# Patient Record
Sex: Female | Born: 1960 | Race: Black or African American | Hispanic: No | Smoking: Former smoker
Health system: Southern US, Community
[De-identification: ages and names within clinical notes are randomized; demographics above are authoritative.]

## PROBLEM LIST (undated history)

## (undated) ENCOUNTER — Emergency Department (HOSPITAL_COMMUNITY): Payer: Commercial Managed Care - PPO

## (undated) DIAGNOSIS — R0981 Nasal congestion: Secondary | ICD-10-CM

## (undated) DIAGNOSIS — Z889 Allergy status to unspecified drugs, medicaments and biological substances status: Secondary | ICD-10-CM

## (undated) DIAGNOSIS — E785 Hyperlipidemia, unspecified: Secondary | ICD-10-CM

## (undated) DIAGNOSIS — E119 Type 2 diabetes mellitus without complications: Secondary | ICD-10-CM

## (undated) DIAGNOSIS — R609 Edema, unspecified: Secondary | ICD-10-CM

## (undated) DIAGNOSIS — R51 Headache: Secondary | ICD-10-CM

## (undated) DIAGNOSIS — H04123 Dry eye syndrome of bilateral lacrimal glands: Secondary | ICD-10-CM

## (undated) DIAGNOSIS — G4733 Obstructive sleep apnea (adult) (pediatric): Secondary | ICD-10-CM

## (undated) DIAGNOSIS — I872 Venous insufficiency (chronic) (peripheral): Secondary | ICD-10-CM

## (undated) DIAGNOSIS — K219 Gastro-esophageal reflux disease without esophagitis: Secondary | ICD-10-CM

## (undated) DIAGNOSIS — R200 Anesthesia of skin: Secondary | ICD-10-CM

## (undated) DIAGNOSIS — E039 Hypothyroidism, unspecified: Secondary | ICD-10-CM

## (undated) DIAGNOSIS — M51369 Other intervertebral disc degeneration, lumbar region without mention of lumbar back pain or lower extremity pain: Secondary | ICD-10-CM

## (undated) DIAGNOSIS — K859 Acute pancreatitis without necrosis or infection, unspecified: Secondary | ICD-10-CM

## (undated) DIAGNOSIS — D649 Anemia, unspecified: Secondary | ICD-10-CM

## (undated) DIAGNOSIS — B351 Tinea unguium: Secondary | ICD-10-CM

## (undated) DIAGNOSIS — Z87442 Personal history of urinary calculi: Secondary | ICD-10-CM

## (undated) DIAGNOSIS — Z9989 Dependence on other enabling machines and devices: Principal | ICD-10-CM

## (undated) DIAGNOSIS — R7303 Prediabetes: Secondary | ICD-10-CM

## (undated) DIAGNOSIS — R011 Cardiac murmur, unspecified: Secondary | ICD-10-CM

## (undated) DIAGNOSIS — K5900 Constipation, unspecified: Secondary | ICD-10-CM

## (undated) DIAGNOSIS — Z9689 Presence of other specified functional implants: Secondary | ICD-10-CM

## (undated) DIAGNOSIS — I10 Essential (primary) hypertension: Secondary | ICD-10-CM

## (undated) DIAGNOSIS — R351 Nocturia: Secondary | ICD-10-CM

## (undated) DIAGNOSIS — R350 Frequency of micturition: Secondary | ICD-10-CM

## (undated) DIAGNOSIS — B029 Zoster without complications: Secondary | ICD-10-CM

## (undated) DIAGNOSIS — M2550 Pain in unspecified joint: Secondary | ICD-10-CM

## (undated) DIAGNOSIS — Z8709 Personal history of other diseases of the respiratory system: Secondary | ICD-10-CM

## (undated) DIAGNOSIS — M5136 Other intervertebral disc degeneration, lumbar region: Secondary | ICD-10-CM

## (undated) DIAGNOSIS — M1990 Unspecified osteoarthritis, unspecified site: Secondary | ICD-10-CM

## (undated) DIAGNOSIS — R600 Localized edema: Secondary | ICD-10-CM

## (undated) HISTORY — PX: ABDOMINAL HYSTERECTOMY: SHX81

## (undated) HISTORY — DX: Zoster without complications: B02.9

## (undated) HISTORY — PX: COLONOSCOPY: SHX174

## (undated) HISTORY — DX: Tinea unguium: B35.1

## (undated) HISTORY — DX: Other intervertebral disc degeneration, lumbar region: M51.36

## (undated) HISTORY — PX: LAMINECTOMY THORACIC SPINE W/ PLACEMENT SPINAL CORD STIMULATOR: SHX1917

## (undated) HISTORY — PX: TUBAL LIGATION: SHX77

## (undated) HISTORY — DX: Obstructive sleep apnea (adult) (pediatric): G47.33

## (undated) HISTORY — PX: CARDIAC SURGERY: SHX584

## (undated) HISTORY — DX: Dependence on other enabling machines and devices: Z99.89

## (undated) HISTORY — PX: ESOPHAGOGASTRODUODENOSCOPY: SHX1529

## (undated) HISTORY — DX: Type 2 diabetes mellitus without complications: E11.9

## (undated) HISTORY — PX: FOOT SURGERY: SHX648

## (undated) HISTORY — DX: Edema, unspecified: R60.9

## (undated) HISTORY — PX: KNEE ARTHROSCOPY: SUR90

## (undated) HISTORY — PX: BACK SURGERY: SHX140

## (undated) HISTORY — DX: Other intervertebral disc degeneration, lumbar region without mention of lumbar back pain or lower extremity pain: M51.369

## (undated) HISTORY — PX: CHOLECYSTECTOMY: SHX55

## (undated) HISTORY — DX: Cardiac murmur, unspecified: R01.1

---

## 1997-05-29 ENCOUNTER — Encounter: Admission: RE | Admit: 1997-05-29 | Discharge: 1997-05-29 | Payer: Self-pay | Admitting: Family Medicine

## 1997-07-25 ENCOUNTER — Encounter: Admission: RE | Admit: 1997-07-25 | Discharge: 1997-07-25 | Payer: Self-pay | Admitting: Family Medicine

## 1997-08-15 ENCOUNTER — Encounter: Admission: RE | Admit: 1997-08-15 | Discharge: 1997-08-15 | Payer: Self-pay | Admitting: Family Medicine

## 1997-10-09 ENCOUNTER — Encounter: Admission: RE | Admit: 1997-10-09 | Discharge: 1997-10-09 | Payer: Self-pay | Admitting: Family Medicine

## 1997-11-13 ENCOUNTER — Encounter: Admission: RE | Admit: 1997-11-13 | Discharge: 1997-11-13 | Payer: Self-pay | Admitting: Family Medicine

## 1998-07-09 ENCOUNTER — Encounter: Admission: RE | Admit: 1998-07-09 | Discharge: 1998-07-09 | Payer: Self-pay | Admitting: Family Medicine

## 1998-11-20 ENCOUNTER — Encounter: Admission: RE | Admit: 1998-11-20 | Discharge: 1998-11-20 | Payer: Self-pay | Admitting: Family Medicine

## 1999-01-23 ENCOUNTER — Encounter: Admission: RE | Admit: 1999-01-23 | Discharge: 1999-01-23 | Payer: Self-pay | Admitting: Family Medicine

## 1999-01-23 ENCOUNTER — Ambulatory Visit (HOSPITAL_COMMUNITY): Admission: RE | Admit: 1999-01-23 | Discharge: 1999-01-23 | Payer: Self-pay | Admitting: Family Medicine

## 1999-03-20 ENCOUNTER — Encounter: Admission: RE | Admit: 1999-03-20 | Discharge: 1999-03-20 | Payer: Self-pay | Admitting: Family Medicine

## 1999-03-25 ENCOUNTER — Encounter: Payer: Self-pay | Admitting: Sports Medicine

## 1999-03-25 ENCOUNTER — Encounter: Admission: RE | Admit: 1999-03-25 | Discharge: 1999-03-25 | Payer: Self-pay | Admitting: *Deleted

## 1999-05-01 ENCOUNTER — Encounter: Admission: RE | Admit: 1999-05-01 | Discharge: 1999-05-01 | Payer: Self-pay | Admitting: Family Medicine

## 1999-06-15 ENCOUNTER — Encounter: Admission: RE | Admit: 1999-06-15 | Discharge: 1999-06-15 | Payer: Self-pay | Admitting: Family Medicine

## 1999-09-01 ENCOUNTER — Encounter: Admission: RE | Admit: 1999-09-01 | Discharge: 1999-09-01 | Payer: Self-pay | Admitting: Family Medicine

## 1999-11-19 ENCOUNTER — Encounter: Admission: RE | Admit: 1999-11-19 | Discharge: 1999-11-19 | Payer: Self-pay | Admitting: Family Medicine

## 2000-03-02 ENCOUNTER — Encounter: Admission: RE | Admit: 2000-03-02 | Discharge: 2000-03-02 | Payer: Self-pay | Admitting: Family Medicine

## 2001-04-14 ENCOUNTER — Encounter: Admission: RE | Admit: 2001-04-14 | Discharge: 2001-04-14 | Payer: Self-pay | Admitting: Family Medicine

## 2001-04-14 ENCOUNTER — Encounter: Payer: Self-pay | Admitting: Family Medicine

## 2001-11-17 ENCOUNTER — Encounter: Admission: RE | Admit: 2001-11-17 | Discharge: 2001-11-17 | Payer: Self-pay | Admitting: Family Medicine

## 2001-11-17 ENCOUNTER — Encounter: Payer: Self-pay | Admitting: Family Medicine

## 2002-05-31 ENCOUNTER — Encounter: Payer: Self-pay | Admitting: Family Medicine

## 2002-05-31 ENCOUNTER — Encounter: Admission: RE | Admit: 2002-05-31 | Discharge: 2002-05-31 | Payer: Self-pay | Admitting: Family Medicine

## 2003-02-26 ENCOUNTER — Emergency Department (HOSPITAL_COMMUNITY): Admission: EM | Admit: 2003-02-26 | Discharge: 2003-02-27 | Payer: Self-pay | Admitting: Emergency Medicine

## 2003-04-29 ENCOUNTER — Encounter: Admission: RE | Admit: 2003-04-29 | Discharge: 2003-04-29 | Payer: Self-pay | Admitting: Family Medicine

## 2004-04-19 ENCOUNTER — Emergency Department (HOSPITAL_COMMUNITY): Admission: EM | Admit: 2004-04-19 | Discharge: 2004-04-20 | Payer: Self-pay | Admitting: Emergency Medicine

## 2005-01-25 HISTORY — PX: CARDIAC CATHETERIZATION: SHX172

## 2005-04-15 ENCOUNTER — Encounter: Admission: RE | Admit: 2005-04-15 | Discharge: 2005-04-15 | Payer: Self-pay | Admitting: Family Medicine

## 2006-04-28 ENCOUNTER — Encounter: Admission: RE | Admit: 2006-04-28 | Discharge: 2006-04-28 | Payer: Self-pay | Admitting: Family Medicine

## 2006-09-21 ENCOUNTER — Emergency Department (HOSPITAL_COMMUNITY): Admission: EM | Admit: 2006-09-21 | Discharge: 2006-09-21 | Payer: Self-pay | Admitting: Emergency Medicine

## 2007-03-12 ENCOUNTER — Emergency Department (HOSPITAL_COMMUNITY): Admission: EM | Admit: 2007-03-12 | Discharge: 2007-03-12 | Payer: Self-pay | Admitting: Emergency Medicine

## 2007-03-13 ENCOUNTER — Emergency Department (HOSPITAL_COMMUNITY): Admission: EM | Admit: 2007-03-13 | Discharge: 2007-03-13 | Payer: Self-pay | Admitting: Emergency Medicine

## 2007-06-22 ENCOUNTER — Encounter: Admission: RE | Admit: 2007-06-22 | Discharge: 2007-06-22 | Payer: Self-pay | Admitting: Family Medicine

## 2007-07-05 ENCOUNTER — Encounter: Admission: RE | Admit: 2007-07-05 | Discharge: 2007-07-05 | Payer: Self-pay | Admitting: Family Medicine

## 2008-06-25 ENCOUNTER — Encounter: Admission: RE | Admit: 2008-06-25 | Discharge: 2008-06-25 | Payer: Self-pay | Admitting: Family Medicine

## 2008-12-02 ENCOUNTER — Encounter: Admission: RE | Admit: 2008-12-02 | Discharge: 2008-12-02 | Payer: Self-pay | Admitting: Cardiovascular Disease

## 2009-03-07 ENCOUNTER — Inpatient Hospital Stay (HOSPITAL_COMMUNITY): Admission: RE | Admit: 2009-03-07 | Discharge: 2009-03-14 | Payer: Self-pay | Admitting: Neurosurgery

## 2009-03-10 ENCOUNTER — Ambulatory Visit: Payer: Self-pay | Admitting: Physical Medicine & Rehabilitation

## 2009-03-10 HISTORY — PX: POSTERIOR LUMBAR FUSION: SHX6036

## 2009-12-15 ENCOUNTER — Encounter: Admission: RE | Admit: 2009-12-15 | Discharge: 2009-12-15 | Payer: Self-pay | Admitting: Family Medicine

## 2009-12-26 ENCOUNTER — Encounter: Admission: RE | Admit: 2009-12-26 | Discharge: 2009-12-26 | Payer: Self-pay | Admitting: Family Medicine

## 2010-02-15 ENCOUNTER — Encounter: Payer: Self-pay | Admitting: Family Medicine

## 2010-04-16 LAB — APTT: aPTT: 26 seconds (ref 24–37)

## 2010-04-16 LAB — URINALYSIS, ROUTINE W REFLEX MICROSCOPIC
Bilirubin Urine: NEGATIVE
Bilirubin Urine: NEGATIVE
Glucose, UA: NEGATIVE mg/dL
Ketones, ur: NEGATIVE mg/dL
Protein, ur: NEGATIVE mg/dL
Specific Gravity, Urine: 1.006 (ref 1.005–1.030)
Urobilinogen, UA: 0.2 mg/dL (ref 0.0–1.0)
pH: 6 (ref 5.0–8.0)

## 2010-04-16 LAB — COMPREHENSIVE METABOLIC PANEL
AST: 261 U/L — ABNORMAL HIGH (ref 0–37)
CO2: 27 mEq/L (ref 19–32)
Calcium: 7.9 mg/dL — ABNORMAL LOW (ref 8.4–10.5)
Creatinine, Ser: 0.74 mg/dL (ref 0.4–1.2)
GFR calc Af Amer: >60 mL/min (ref >60)
GFR calc non Af Amer: >60 mL/min (ref >60)
Glucose, Bld: 185 mg/dL — ABNORMAL HIGH (ref 70–99)

## 2010-04-16 LAB — CBC
HCT: 39.7 % (ref 36.0–46.0)
MCHC: 33.3 g/dL (ref 30.0–36.0)
MCHC: 33.8 g/dL (ref 30.0–36.0)
MCV: 84 fL (ref 78.0–100.0)
MCV: 84.4 fL (ref 78.0–100.0)
RBC: 3.52 MIL/uL — ABNORMAL LOW (ref 3.87–5.11)
RBC: 4.7 MIL/uL (ref 3.87–5.11)
RDW: 13.1 % (ref 11.5–15.5)
WBC: 6.1 10*3/uL (ref 4.0–10.5)

## 2010-04-16 LAB — BASIC METABOLIC PANEL
BUN: 10 mg/dL (ref 6–23)
CO2: 29 mEq/L (ref 19–32)
Chloride: 101 mEq/L (ref 96–112)
Creatinine, Ser: 0.73 mg/dL (ref 0.4–1.2)
GFR calc Af Amer: >60 mL/min (ref >60)
Potassium: 4 mEq/L (ref 3.5–5.1)

## 2010-04-16 LAB — POCT I-STAT 4, (NA,K, GLUC, HGB,HCT)
Glucose, Bld: 137 mg/dL — ABNORMAL HIGH (ref 70–99)
HCT: 28 % — ABNORMAL LOW (ref 36.0–46.0)
Sodium: 138 mEq/L (ref 135–145)
Sodium: 139 mEq/L (ref 135–145)

## 2010-04-16 LAB — DIFFERENTIAL
Basophils Relative: 1 % (ref 0–1)
Eosinophils Absolute: 0.4 10*3/uL (ref 0.0–0.7)
Eosinophils Relative: 7 % — ABNORMAL HIGH (ref 0–5)
Lymphs Abs: 2.6 10*3/uL (ref 0.7–4.0)
Monocytes Relative: 8 % (ref 3–12)
Neutrophils Relative %: 41 % — ABNORMAL LOW (ref 43–77)

## 2010-04-16 LAB — URINE CULTURE

## 2010-04-16 LAB — ABO/RH: ABO/RH(D): A POS

## 2010-04-16 LAB — PROTIME-INR: Prothrombin Time: 16.4 seconds — ABNORMAL HIGH (ref 11.6–15.2)

## 2010-04-16 LAB — TYPE AND SCREEN
ABO/RH(D): A POS
Antibody Screen: NEGATIVE

## 2010-04-16 LAB — URINE MICROSCOPIC-ADD ON

## 2010-04-18 ENCOUNTER — Emergency Department (HOSPITAL_COMMUNITY)
Admission: EM | Admit: 2010-04-18 | Discharge: 2010-04-18 | Disposition: A | Discharge status: Home / Self Care | Payer: BC Managed Care – PPO | Attending: Emergency Medicine | Admitting: Emergency Medicine

## 2010-04-18 ENCOUNTER — Emergency Department (HOSPITAL_COMMUNITY): Payer: BC Managed Care – PPO

## 2010-04-18 DIAGNOSIS — E039 Hypothyroidism, unspecified: Secondary | ICD-10-CM | POA: Insufficient documentation

## 2010-04-18 DIAGNOSIS — J029 Acute pharyngitis, unspecified: Secondary | ICD-10-CM | POA: Insufficient documentation

## 2010-04-18 DIAGNOSIS — Z139 Encounter for screening, unspecified: Secondary | ICD-10-CM | POA: Insufficient documentation

## 2010-04-18 DIAGNOSIS — M129 Arthropathy, unspecified: Secondary | ICD-10-CM | POA: Insufficient documentation

## 2010-04-18 DIAGNOSIS — K219 Gastro-esophageal reflux disease without esophagitis: Secondary | ICD-10-CM | POA: Insufficient documentation

## 2010-04-18 DIAGNOSIS — I10 Essential (primary) hypertension: Secondary | ICD-10-CM | POA: Insufficient documentation

## 2010-04-18 LAB — POCT I-STAT, CHEM 8
Calcium, Ion: 1.05 mmol/L — ABNORMAL LOW (ref 1.12–1.32)
Chloride: 104 mEq/L (ref 96–112)
HCT: 42 % (ref 36.0–46.0)
Potassium: 6.3 mEq/L (ref 3.5–5.1)

## 2010-04-18 LAB — RAPID STREP SCREEN (MED CTR MEBANE ONLY): Streptococcus, Group A Screen (Direct): NEGATIVE

## 2010-04-18 MED ORDER — IOHEXOL 300 MG/ML  SOLN
100.0000 mL | Freq: Once | INTRAMUSCULAR | Status: AC | PRN
  Administered 2010-04-18: 100 mL via INTRAVENOUS

## 2010-10-16 LAB — CBC
HCT: 36.5
Hemoglobin: 12.5
MCHC: 34.2
MCV: 79.3
Platelets: 399
RBC: 4.6
RDW: 13.8
WBC: 6.8

## 2010-10-16 LAB — URINALYSIS, ROUTINE W REFLEX MICROSCOPIC
Protein, ur: 30 — AB
Urobilinogen, UA: 1

## 2010-10-16 LAB — DIFFERENTIAL
Basophils Absolute: 0.1
Basophils Relative: 1
Eosinophils Absolute: 0.5
Eosinophils Relative: 7 — ABNORMAL HIGH
Lymphocytes Relative: 35
Lymphs Abs: 2.3
Monocytes Absolute: 0.7
Monocytes Relative: 11
Neutro Abs: 3.1
Neutrophils Relative %: 46

## 2010-10-16 LAB — BASIC METABOLIC PANEL
CO2: 27
Chloride: 102
Creatinine, Ser: 0.67
GFR calc Af Amer: >60
Potassium: 3.7

## 2010-10-16 LAB — BASIC METABOLIC PANEL WITH GFR
BUN: 10
Calcium: 8.9
GFR calc non Af Amer: >60
Glucose, Bld: 112 — ABNORMAL HIGH
Sodium: 135

## 2010-10-16 LAB — URINE CULTURE
Colony Count: NO GROWTH
Culture: NO GROWTH

## 2010-10-16 LAB — URINE MICROSCOPIC-ADD ON

## 2010-11-06 LAB — DIFFERENTIAL
Eosinophils Absolute: 0.3
Eosinophils Relative: 6 — ABNORMAL HIGH
Lymphocytes Relative: 38
Lymphs Abs: 2
Monocytes Absolute: 0.5

## 2010-11-06 LAB — CBC
Platelets: 376
RBC: 4.83
WBC: 5.2

## 2010-11-06 LAB — COMPREHENSIVE METABOLIC PANEL
ALT: 19
AST: 20
Albumin: 3.4 — ABNORMAL LOW
CO2: 27
Calcium: 9.2
Chloride: 105
GFR calc Af Amer: >60
GFR calc non Af Amer: >60
Sodium: 140

## 2010-11-06 LAB — URINE MICROSCOPIC-ADD ON

## 2010-11-06 LAB — URINALYSIS, ROUTINE W REFLEX MICROSCOPIC
Glucose, UA: NEGATIVE
Leukocytes, UA: NEGATIVE
Nitrite: NEGATIVE
Specific Gravity, Urine: 1.028
pH: 5.5

## 2010-11-06 LAB — LIPASE, BLOOD: Lipase: 16

## 2010-11-27 ENCOUNTER — Other Ambulatory Visit: Payer: Self-pay | Admitting: Family Medicine

## 2010-11-27 DIAGNOSIS — Z1231 Encounter for screening mammogram for malignant neoplasm of breast: Secondary | ICD-10-CM

## 2011-02-02 ENCOUNTER — Ambulatory Visit
Admission: RE | Admit: 2011-02-02 | Discharge: 2011-02-02 | Disposition: A | Discharge status: Home / Self Care | Payer: BC Managed Care – PPO | Source: Ambulatory Visit | Attending: Family Medicine | Admitting: Family Medicine

## 2011-02-02 DIAGNOSIS — Z1231 Encounter for screening mammogram for malignant neoplasm of breast: Secondary | ICD-10-CM

## 2011-12-05 ENCOUNTER — Observation Stay (HOSPITAL_COMMUNITY)
Admission: EM | Admit: 2011-12-05 | Discharge: 2011-12-10 | Disposition: A | Discharge status: Home / Self Care | Payer: BC Managed Care – PPO | Attending: Internal Medicine | Admitting: Internal Medicine

## 2011-12-05 ENCOUNTER — Encounter (HOSPITAL_COMMUNITY): Payer: Self-pay | Admitting: Emergency Medicine

## 2011-12-05 ENCOUNTER — Emergency Department (HOSPITAL_COMMUNITY): Payer: BC Managed Care – PPO

## 2011-12-05 DIAGNOSIS — K219 Gastro-esophageal reflux disease without esophagitis: Secondary | ICD-10-CM | POA: Insufficient documentation

## 2011-12-05 DIAGNOSIS — Z7982 Long term (current) use of aspirin: Secondary | ICD-10-CM | POA: Insufficient documentation

## 2011-12-05 DIAGNOSIS — I10 Essential (primary) hypertension: Secondary | ICD-10-CM

## 2011-12-05 DIAGNOSIS — Z7902 Long term (current) use of antithrombotics/antiplatelets: Secondary | ICD-10-CM | POA: Insufficient documentation

## 2011-12-05 DIAGNOSIS — F411 Generalized anxiety disorder: Secondary | ICD-10-CM | POA: Insufficient documentation

## 2011-12-05 DIAGNOSIS — B9689 Other specified bacterial agents as the cause of diseases classified elsewhere: Secondary | ICD-10-CM | POA: Diagnosis present

## 2011-12-05 DIAGNOSIS — R0602 Shortness of breath: Secondary | ICD-10-CM | POA: Insufficient documentation

## 2011-12-05 DIAGNOSIS — R072 Precordial pain: Secondary | ICD-10-CM | POA: Diagnosis present

## 2011-12-05 DIAGNOSIS — E876 Hypokalemia: Secondary | ICD-10-CM

## 2011-12-05 DIAGNOSIS — F419 Anxiety disorder, unspecified: Secondary | ICD-10-CM | POA: Diagnosis present

## 2011-12-05 DIAGNOSIS — M79609 Pain in unspecified limb: Secondary | ICD-10-CM | POA: Insufficient documentation

## 2011-12-05 DIAGNOSIS — N760 Acute vaginitis: Secondary | ICD-10-CM | POA: Insufficient documentation

## 2011-12-05 DIAGNOSIS — R7309 Other abnormal glucose: Secondary | ICD-10-CM | POA: Insufficient documentation

## 2011-12-05 DIAGNOSIS — R079 Chest pain, unspecified: Principal | ICD-10-CM | POA: Insufficient documentation

## 2011-12-05 DIAGNOSIS — K760 Fatty (change of) liver, not elsewhere classified: Secondary | ICD-10-CM | POA: Diagnosis present

## 2011-12-05 DIAGNOSIS — A499 Bacterial infection, unspecified: Secondary | ICD-10-CM | POA: Insufficient documentation

## 2011-12-05 DIAGNOSIS — Z792 Long term (current) use of antibiotics: Secondary | ICD-10-CM | POA: Insufficient documentation

## 2011-12-05 DIAGNOSIS — Z79899 Other long term (current) drug therapy: Secondary | ICD-10-CM | POA: Insufficient documentation

## 2011-12-05 DIAGNOSIS — E785 Hyperlipidemia, unspecified: Secondary | ICD-10-CM | POA: Insufficient documentation

## 2011-12-05 DIAGNOSIS — K7689 Other specified diseases of liver: Secondary | ICD-10-CM | POA: Insufficient documentation

## 2011-12-05 DIAGNOSIS — E039 Hypothyroidism, unspecified: Secondary | ICD-10-CM | POA: Diagnosis present

## 2011-12-05 DIAGNOSIS — M549 Dorsalgia, unspecified: Secondary | ICD-10-CM | POA: Insufficient documentation

## 2011-12-05 DIAGNOSIS — K859 Acute pancreatitis without necrosis or infection, unspecified: Secondary | ICD-10-CM | POA: Diagnosis present

## 2011-12-05 HISTORY — DX: Unspecified osteoarthritis, unspecified site: M19.90

## 2011-12-05 HISTORY — DX: Gastro-esophageal reflux disease without esophagitis: K21.9

## 2011-12-05 HISTORY — DX: Essential (primary) hypertension: I10

## 2011-12-05 HISTORY — DX: Hypothyroidism, unspecified: E03.9

## 2011-12-05 HISTORY — DX: Hyperlipidemia, unspecified: E78.5

## 2011-12-05 LAB — COMPREHENSIVE METABOLIC PANEL
Albumin: 3.5 g/dL (ref 3.5–5.2)
Alkaline Phosphatase: 103 U/L (ref 39–117)
BUN: 11 mg/dL (ref 6–23)
Calcium: 9.1 mg/dL (ref 8.4–10.5)
Potassium: 3.7 mEq/L (ref 3.5–5.1)
Sodium: 137 mEq/L (ref 135–145)
Total Protein: 7.9 g/dL (ref 6.0–8.3)

## 2011-12-05 LAB — CBC WITH DIFFERENTIAL/PLATELET
Basophils Relative: 0 % (ref 0–1)
Eosinophils Absolute: 0.3 10*3/uL (ref 0.0–0.7)
Eosinophils Relative: 5 % (ref 0–5)
MCH: 26.6 pg (ref 26.0–34.0)
MCHC: 32.9 g/dL (ref 30.0–36.0)
Monocytes Relative: 12 % (ref 3–12)
Neutrophils Relative %: 46 % (ref 43–77)
Platelets: 313 10*3/uL (ref 150–400)

## 2011-12-05 LAB — POCT I-STAT TROPONIN I: Troponin i, poc: 0 ng/mL (ref 0.00–0.08)

## 2011-12-05 LAB — TROPONIN I: Troponin I: <0.3 ng/mL (ref <0.30)

## 2011-12-05 MED ORDER — GI COCKTAIL ~~LOC~~
30.0000 mL | Freq: Once | ORAL | Status: AC
  Administered 2011-12-05: 30 mL via ORAL
  Filled 2011-12-05: qty 30

## 2011-12-05 MED ORDER — ASPIRIN 81 MG PO CHEW
324.0000 mg | CHEWABLE_TABLET | Freq: Once | ORAL | Status: AC
  Administered 2011-12-05: 324 mg via ORAL
  Filled 2011-12-05: qty 4

## 2011-12-05 NOTE — ED Notes (Signed)
Patient reports that she has been having chest pains since Friday; saw OB/GYN on Friday -- was given an antibiotic for vaginosis.  Patient reports pain is mid-sternal; does not radiate.  Pain worsens upon inspiration.  Denies diaphoresis, nausea, and vomiting.  Pain worse today than the past two days.

## 2011-12-06 ENCOUNTER — Encounter (HOSPITAL_COMMUNITY): Payer: Self-pay | Admitting: Internal Medicine

## 2011-12-06 ENCOUNTER — Observation Stay (HOSPITAL_COMMUNITY): Payer: BC Managed Care – PPO

## 2011-12-06 DIAGNOSIS — K219 Gastro-esophageal reflux disease without esophagitis: Secondary | ICD-10-CM | POA: Diagnosis present

## 2011-12-06 DIAGNOSIS — E789 Disorder of lipoprotein metabolism, unspecified: Secondary | ICD-10-CM

## 2011-12-06 DIAGNOSIS — I10 Essential (primary) hypertension: Secondary | ICD-10-CM | POA: Diagnosis present

## 2011-12-06 DIAGNOSIS — E039 Hypothyroidism, unspecified: Secondary | ICD-10-CM

## 2011-12-06 DIAGNOSIS — R072 Precordial pain: Secondary | ICD-10-CM | POA: Diagnosis present

## 2011-12-06 DIAGNOSIS — R079 Chest pain, unspecified: Secondary | ICD-10-CM

## 2011-12-06 DIAGNOSIS — N760 Acute vaginitis: Secondary | ICD-10-CM

## 2011-12-06 DIAGNOSIS — F419 Anxiety disorder, unspecified: Secondary | ICD-10-CM | POA: Diagnosis present

## 2011-12-06 DIAGNOSIS — E876 Hypokalemia: Secondary | ICD-10-CM

## 2011-12-06 DIAGNOSIS — B9689 Other specified bacterial agents as the cause of diseases classified elsewhere: Secondary | ICD-10-CM

## 2011-12-06 DIAGNOSIS — A499 Bacterial infection, unspecified: Secondary | ICD-10-CM

## 2011-12-06 HISTORY — DX: Hypothyroidism, unspecified: E03.9

## 2011-12-06 HISTORY — DX: Gastro-esophageal reflux disease without esophagitis: K21.9

## 2011-12-06 LAB — LIPASE, BLOOD: Lipase: 218 U/L — ABNORMAL HIGH (ref 11–59)

## 2011-12-06 LAB — LIPID PANEL
Total CHOL/HDL Ratio: 2 RATIO
VLDL: 12 mg/dL (ref 0–40)

## 2011-12-06 LAB — BASIC METABOLIC PANEL
BUN: 13 mg/dL (ref 6–23)
Creatinine, Ser: 0.65 mg/dL (ref 0.50–1.10)
GFR calc Af Amer: >90 mL/min (ref >90)
GFR calc non Af Amer: >90 mL/min (ref >90)
Potassium: 3.4 mEq/L — ABNORMAL LOW (ref 3.5–5.1)

## 2011-12-06 LAB — TROPONIN I
Troponin I: <0.3 ng/mL (ref <0.30)
Troponin I: <0.3 ng/mL (ref <0.30)

## 2011-12-06 LAB — CBC
MCHC: 32.6 g/dL (ref 30.0–36.0)
RDW: 14.7 % (ref 11.5–15.5)

## 2011-12-06 LAB — TSH: TSH: 8.046 u[IU]/mL — ABNORMAL HIGH (ref 0.350–4.500)

## 2011-12-06 MED ORDER — OXYCODONE HCL 5 MG PO TABS: 5.0000 mg | ORAL_TABLET | ORAL | Status: DC | PRN

## 2011-12-06 MED ORDER — LEVOTHYROXINE SODIUM 50 MCG PO TABS
50.0000 ug | ORAL_TABLET | Freq: Every day | ORAL | Status: DC
  Administered 2011-12-06 – 2011-12-10 (×5): 50 ug via ORAL
  Filled 2011-12-06 (×6): qty 1

## 2011-12-06 MED ORDER — PANTOPRAZOLE SODIUM 40 MG PO TBEC
40.0000 mg | DELAYED_RELEASE_TABLET | Freq: Every day | ORAL | Status: DC
  Administered 2011-12-06 – 2011-12-10 (×5): 40 mg via ORAL
  Filled 2011-12-06 (×6): qty 1

## 2011-12-06 MED ORDER — IRBESARTAN 150 MG PO TABS
150.0000 mg | ORAL_TABLET | Freq: Every day | ORAL | Status: DC
  Administered 2011-12-06 – 2011-12-10 (×5): 150 mg via ORAL
  Filled 2011-12-06 (×5): qty 1

## 2011-12-06 MED ORDER — METRONIDAZOLE 500 MG PO TABS
500.0000 mg | ORAL_TABLET | Freq: Two times a day (BID) | ORAL | Status: DC
  Administered 2011-12-06 – 2011-12-09 (×7): 500 mg via ORAL
  Filled 2011-12-06 (×10): qty 1

## 2011-12-06 MED ORDER — NITROGLYCERIN 2 % TD OINT
0.5000 [in_us] | TOPICAL_OINTMENT | Freq: Four times a day (QID) | TRANSDERMAL | Status: DC
  Administered 2011-12-06 – 2011-12-09 (×7): 0.5 [in_us] via TOPICAL
  Filled 2011-12-06 (×29): qty 30

## 2011-12-06 MED ORDER — OMEGA-3-ACID ETHYL ESTERS 1 G PO CAPS
1.0000 g | ORAL_CAPSULE | Freq: Every day | ORAL | Status: DC
  Administered 2011-12-06 – 2011-12-10 (×5): 1 g via ORAL
  Filled 2011-12-06 (×5): qty 1

## 2011-12-06 MED ORDER — ENOXAPARIN SODIUM 40 MG/0.4ML ~~LOC~~ SOLN
40.0000 mg | SUBCUTANEOUS | Status: DC
  Administered 2011-12-06 – 2011-12-09 (×4): 40 mg via SUBCUTANEOUS
  Filled 2011-12-06 (×5): qty 0.4

## 2011-12-06 MED ORDER — METOPROLOL TARTRATE 25 MG PO TABS
25.0000 mg | ORAL_TABLET | Freq: Two times a day (BID) | ORAL | Status: DC
  Administered 2011-12-06 – 2011-12-10 (×9): 25 mg via ORAL
  Filled 2011-12-06 (×10): qty 1

## 2011-12-06 MED ORDER — POLYETHYL GLYCOL-PROPYL GLYCOL 0.4-0.3 % OP SOLN: Freq: Every day | OPHTHALMIC | Status: DC | PRN

## 2011-12-06 MED ORDER — POTASSIUM CHLORIDE CRYS ER 20 MEQ PO TBCR
40.0000 meq | EXTENDED_RELEASE_TABLET | Freq: Once | ORAL | Status: AC
  Administered 2011-12-06: 11:00:00 via ORAL
  Filled 2011-12-06: qty 2

## 2011-12-06 MED ORDER — VALSARTAN-HYDROCHLOROTHIAZIDE 160-12.5 MG PO TABS: 1.0000 | ORAL_TABLET | Freq: Every day | ORAL | Status: DC

## 2011-12-06 MED ORDER — ONDANSETRON HCL 4 MG/2ML IJ SOLN: 4.0000 mg | Freq: Four times a day (QID) | INTRAMUSCULAR | Status: DC | PRN

## 2011-12-06 MED ORDER — HYDROCODONE-ACETAMINOPHEN 10-325 MG PO TABS
1.0000 | ORAL_TABLET | Freq: Four times a day (QID) | ORAL | Status: DC | PRN
  Administered 2011-12-07 – 2011-12-08 (×2): 1 via ORAL
  Filled 2011-12-06 (×2): qty 1

## 2011-12-06 MED ORDER — HYDROMORPHONE HCL PF 1 MG/ML IJ SOLN
0.5000 mg | INTRAMUSCULAR | Status: DC | PRN
  Administered 2011-12-06 – 2011-12-08 (×3): 1 mg via INTRAVENOUS
  Filled 2011-12-06 (×3): qty 1

## 2011-12-06 MED ORDER — POLYVINYL ALCOHOL 1.4 % OP SOLN
1.0000 [drp] | Freq: Every day | OPHTHALMIC | Status: DC | PRN
  Filled 2011-12-06: qty 15

## 2011-12-06 MED ORDER — MORPHINE SULFATE 4 MG/ML IJ SOLN
4.0000 mg | Freq: Once | INTRAMUSCULAR | Status: AC
  Administered 2011-12-06: 4 mg via INTRAVENOUS
  Filled 2011-12-06: qty 1

## 2011-12-06 MED ORDER — NITROGLYCERIN 0.4 MG SL SUBL
0.4000 mg | SUBLINGUAL_TABLET | SUBLINGUAL | Status: DC | PRN
  Administered 2011-12-06 – 2011-12-07 (×2): 0.4 mg via SUBLINGUAL
  Filled 2011-12-06 (×2): qty 25

## 2011-12-06 MED ORDER — ZOLPIDEM TARTRATE 5 MG PO TABS: 5.0000 mg | ORAL_TABLET | Freq: Every evening | ORAL | Status: DC | PRN

## 2011-12-06 MED ORDER — ONDANSETRON HCL 4 MG PO TABS: 4.0000 mg | ORAL_TABLET | Freq: Four times a day (QID) | ORAL | Status: DC | PRN

## 2011-12-06 MED ORDER — SODIUM CHLORIDE 0.9 % IV SOLN
INTRAVENOUS | Status: DC
  Administered 2011-12-08: 16:00:00 via INTRAVENOUS

## 2011-12-06 MED ORDER — GABAPENTIN 300 MG PO CAPS
300.0000 mg | ORAL_CAPSULE | Freq: Three times a day (TID) | ORAL | Status: DC
  Administered 2011-12-06 – 2011-12-10 (×13): 300 mg via ORAL
  Filled 2011-12-06 (×15): qty 1

## 2011-12-06 MED ORDER — SIMETHICONE 80 MG PO CHEW
160.0000 mg | CHEWABLE_TABLET | Freq: Four times a day (QID) | ORAL | Status: DC | PRN
  Filled 2011-12-06: qty 2

## 2011-12-06 MED ORDER — ACETAMINOPHEN 325 MG PO TABS: 650.0000 mg | ORAL_TABLET | Freq: Four times a day (QID) | ORAL | Status: DC | PRN

## 2011-12-06 MED ORDER — MAGNESIUM HYDROXIDE 400 MG/5ML PO SUSP
15.0000 mL | Freq: Every day | ORAL | Status: DC | PRN
  Administered 2011-12-06 – 2011-12-07 (×2): 15 mL via ORAL
  Filled 2011-12-06 (×2): qty 30

## 2011-12-06 MED ORDER — ACETAMINOPHEN 650 MG RE SUPP: 650.0000 mg | Freq: Four times a day (QID) | RECTAL | Status: DC | PRN

## 2011-12-06 MED ORDER — ASPIRIN 325 MG PO TABS
325.0000 mg | ORAL_TABLET | Freq: Every day | ORAL | Status: DC
  Administered 2011-12-06 – 2011-12-09 (×4): 325 mg via ORAL
  Filled 2011-12-06 (×4): qty 1

## 2011-12-06 MED ORDER — GI COCKTAIL ~~LOC~~
30.0000 mL | Freq: Three times a day (TID) | ORAL | Status: DC | PRN
  Filled 2011-12-06: qty 30

## 2011-12-06 MED ORDER — ATORVASTATIN CALCIUM 10 MG PO TABS
10.0000 mg | ORAL_TABLET | Freq: Every day | ORAL | Status: DC
  Administered 2011-12-06 – 2011-12-07 (×2): 10 mg via ORAL
  Filled 2011-12-06 (×3): qty 1

## 2011-12-06 MED ORDER — ALUM & MAG HYDROXIDE-SIMETH 200-200-20 MG/5ML PO SUSP: 30.0000 mL | Freq: Four times a day (QID) | ORAL | Status: DC | PRN

## 2011-12-06 MED ORDER — DIAZEPAM 5 MG PO TABS
5.0000 mg | ORAL_TABLET | Freq: Every day | ORAL | Status: DC
  Administered 2011-12-06 – 2011-12-10 (×5): 5 mg via ORAL
  Filled 2011-12-06 (×5): qty 1

## 2011-12-06 MED ORDER — ALPRAZOLAM 0.25 MG PO TABS
0.2500 mg | ORAL_TABLET | Freq: Two times a day (BID) | ORAL | Status: DC
  Administered 2011-12-06 – 2011-12-09 (×6): 0.25 mg via ORAL
  Filled 2011-12-06 (×8): qty 1

## 2011-12-06 MED ORDER — NABUMETONE 750 MG PO TABS
1500.0000 mg | ORAL_TABLET | Freq: Two times a day (BID) | ORAL | Status: DC
  Administered 2011-12-06 – 2011-12-10 (×9): 1500 mg via ORAL
  Filled 2011-12-06 (×11): qty 2

## 2011-12-06 MED ORDER — SODIUM CHLORIDE 0.9 % IJ SOLN
3.0000 mL | Freq: Two times a day (BID) | INTRAMUSCULAR | Status: DC
  Administered 2011-12-06 – 2011-12-10 (×9): 3 mL via INTRAVENOUS

## 2011-12-06 MED ORDER — HYDROCHLOROTHIAZIDE 25 MG PO TABS
12.5000 mg | ORAL_TABLET | Freq: Every day | ORAL | Status: DC
  Administered 2011-12-06 – 2011-12-10 (×5): 12.5 mg via ORAL
  Filled 2011-12-06 (×5): qty 0.5

## 2011-12-06 NOTE — ED Provider Notes (Signed)
History     CSN: [id number]  Arrival date & time 12/05/11  8469   First MD Initiated Contact with Patient 12/05/11 2309      Chief Complaint  Patient presents with  . Chest Pain    (Consider location/radiation/quality/duration/timing/severity/associated sxs/prior treatment) HPI 51 year old female presents to emergency room with complaint of substernal chest pain without radiation, mild shortness of breath. She denies nausea vomiting. Patient status post cholecystectomy, has history of hypertension, hyperlipidemia, borderline diabetes. She takes an aspirin every other day. She denies family history of coronary disease. Patient recently started on antibiotic by her GYN due to bacterial vaginosis, has been taking Flagyl since Wednesday. Pain started on Friday, initially intermittent, but they getting steadily worse today. She took Gas-X at home without improvement in symptoms. She denies history of reflux.   Past Medical History  Diagnosis Date  . BV (bacterial vaginosis)   . Hypertension   . Hyperlipidemia   . Arthritis     Past Surgical History  Procedure Date  . Cholecystectomy   . Abdominal hysterectomy     History reviewed. No pertinent family history.  History  Substance Use Topics  . Smoking status: Never Smoker   . Smokeless tobacco: Not on file  . Alcohol Use: No    OB History    Grav Para Term Preterm Abortions TAB SAB Ect Mult Living                  Review of Systems  All other systems reviewed and are negative.  other than listed in HPI  Allergies  Penicillins and Vesicare  Home Medications   Current Outpatient Rx  Name  Route  Sig  Dispense  Refill  . ALPRAZOLAM 0.25 MG PO TABS   Oral   Take 0.25 mg by mouth 2 (two) times daily.         . ASPIRIN EC 81 MG PO TBEC   Oral   Take 81 mg by mouth daily.         Marland Kitchen DIAZEPAM 5 MG PO TABS   Oral   Take 5 mg by mouth daily.         Marland Kitchen ESOMEPRAZOLE MAGNESIUM 40 MG PO CPDR   Oral   Take 40  mg by mouth daily before breakfast.         . GABAPENTIN 300 MG PO CAPS   Oral   Take 300 mg by mouth 3 (three) times daily.         Marland Kitchen HYDROCODONE-ACETAMINOPHEN 10-325 MG PO TABS   Oral   Take 1 tablet by mouth every 6 (six) hours as needed. For pain         . LEVOTHYROXINE SODIUM 50 MCG PO TABS   Oral   Take 50 mcg by mouth daily.         Marland Kitchen METOPROLOL TARTRATE 25 MG PO TABS   Oral   Take 25 mg by mouth 2 (two) times daily.         Marland Kitchen METRONIDAZOLE 500 MG PO TABS   Oral   Take 500 mg by mouth 2 (two) times daily.         Marland Kitchen NABUMETONE 750 MG PO TABS   Oral   Take 1,500 mg by mouth 2 (two) times daily.         . OMEGA-3-ACID ETHYL ESTERS 1 G PO CAPS   Oral   Take 1 g by mouth daily.         Marland Kitchen  PITAVASTATIN CALCIUM 1 MG PO TABS   Oral   Take 1 mg by mouth daily.         Frazier Butt OP   Both Eyes   Place 1 drop into both eyes daily as needed. For dry eyes         . GAS-X PO   Oral   Take 2 tablets by mouth daily as needed. For gas         . VALSARTAN-HYDROCHLOROTHIAZIDE 160-12.5 MG PO TABS   Oral   Take 1 tablet by mouth daily.           BP 153/84  Pulse 81  Temp 97.9 F (36.6 C) (Oral)  Resp 13  SpO2 97%  Physical Exam  Nursing note and vitals reviewed. Constitutional: She is oriented to person, place, and time. She appears well-developed and well-nourished.       Uncomfortable appearing  HENT:  Head: Normocephalic and atraumatic.  Nose: Nose normal.  Mouth/Throat: Oropharynx is clear and moist.  Eyes: Conjunctivae normal and EOM are normal. Pupils are equal, round, and reactive to light.  Neck: Normal range of motion. Neck supple. No JVD present. No tracheal deviation present. No thyromegaly present.  Cardiovascular: Normal rate, regular rhythm, normal heart sounds and intact distal pulses.  Exam reveals no gallop and no friction rub.   No murmur heard. Pulmonary/Chest: Effort normal and breath sounds normal. No stridor. No  respiratory distress. She has no wheezes. She has no rales. She exhibits no tenderness.  Abdominal: Soft. Bowel sounds are normal. She exhibits no distension and no mass. There is tenderness (mild epigastric tenderness). There is no rebound and no guarding.  Musculoskeletal: Normal range of motion. She exhibits no edema and no tenderness.  Lymphadenopathy:    She has no cervical adenopathy.  Neurological: She is oriented to person, place, and time. She has normal reflexes. She exhibits normal muscle tone. Coordination normal.  Skin: Skin is warm and dry. No rash noted. No erythema. No pallor.  Psychiatric: She has a normal mood and affect. Her behavior is normal. Judgment and thought content normal.    ED Course  Procedures (including critical care time)  Labs Reviewed  COMPREHENSIVE METABOLIC PANEL - Abnormal; Notable for the following:    Glucose, Bld 158 (*)     Total Bilirubin 0.2 (*)     All other components within normal limits  CBC WITH DIFFERENTIAL  POCT I-STAT TROPONIN I  TROPONIN I   Dg Chest 2 View  12/05/2011  *RADIOLOGY REPORT*  Clinical Data: Chest pain and shortness of breath.  Hypertension.  CHEST - 2 VIEW  Comparison: 03/03/2009  Findings: Cardiac and mediastinal contours appear normal.  The lungs appear clear.  No pleural effusion is identified.  Thoracic spondylosis noted.  We partially visualized lower lumbar posterolateral rod and plate and screw fixators.  IMPRESSION:  1.  Thoracic spondylosis.  No acute findings.   Original Report Authenticated By: Gaylyn Rong, M.D.     Date: 12/05/2011  Rate:79  Rhythm: normal sinus rhythm  QRS Axis: normal  Intervals: normal  ST/T Wave abnormalities: nonspecific T wave changes  Conduction Disutrbances:none  Narrative Interpretation: new flattening and reversed t waves  Old EKG Reviewed: changes noted    1. Chest pain       MDM  51 year old female with risk factors for coronary disease who presents with  substernal chest pain ongoing for the last 3 days, worse today. Patient recently started Flagyl, which may be  cause for symptoms, but she has had no improvement with GI cocktail area patient has been given aspirin, nitroglycerin. She reports only slight improvement with nitroglycerin. She is to have morphine. Will discuss with cardiology for a consult.  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  D/w hospitalist who is team who now admits for [facility]       Olivia Mackie, MD 12/06/11 [phone]

## 2011-12-06 NOTE — Progress Notes (Signed)
[facility] PROGRESS NOTE  [name] ZOX:[id number] DOB: Apr 26, 1960 DOA: 12/05/2011 PCP: Thora Lance, MD  Assessment/Plan:  #1 chest pain Patient with complaints of a three-day history of substernal chest pain which had worsened by day of admission and constant in nature. Cardiac enzymes are negative x2. Chest pain was unrelieved with nitroglycerin nor a GI cocktail on a relieved with morphine sulfate. Patient does have risk factors of obesity, hyperlipidemia, hypertension. Will check a 2-D echo. Will check a d-dimer. Continue oxygen, aspirin, morphine sulfate, nitroglycerin paste, PPI. We'll consult with cardiology for further evaluation and management.  #2 hypokalemia Replete.  #3 hyperlipidemia Check a fasting lipid panel. Resume Lovenox and and statin.  #4 hypertension Continue Diovan HCT, Lopressor.  #5 hypothyroidism Check a TSH. Continue home dose Synthroid.  #6 gastroesophageal reflux disease PPI  #7 bacterial vaginosis Resume home regimen of Flagyl.  #8 probable anxiety Continue Xanax and valium.  #9 prophylaxis PPI for GI prophylaxis, Lovenox for DVT prophylaxis.  Code Status: Full Family Communication: Updated patient. Disposition Plan: Home when medically stable   Consultants:  Cardiology: [facility] pending 12/06/2011  Procedures:  Chest x-ray 12/05/2011  Antibiotics:  Oral Flagyl 12/06/2011 was started as outpatient.  HPI/Subjective: Patient denies any current chest pain. Patient denies any shortness of breath. Patient states prior to admission chest pain was deep substernal and constant in nature unrelieved by nitroglycerin unrelieved by GI cocktail only relieved with morphine sulfate.  Objective: Filed Vitals:   12/05/11 1954 12/05/11 2331 12/06/11 0304 12/06/11 0434  BP: 148/92 153/84 121/71 144/86  Pulse: 81   88  Temp: 97.9 F (36.6 C)   97.7 F (36.5 C)  TempSrc: Oral   Oral  Resp: 16 13 15 18   Height:    5\' 4"  (1.626 m)   Weight:    126.735 kg (279 lb 6.4 oz)  SpO2: 96% 97% 98% 98%    Intake/Output Summary (Last 24 hours) at 12/06/11 1007 Last data filed at 12/06/11 0926  Gross per 24 hour  Intake      0 ml  Output    300 ml  Net   -300 ml   Filed Weights   12/06/11 0434  Weight: 126.735 kg (279 lb 6.4 oz)    Exam:   General:  NAD  Cardiovascular: RRR. No JVD,. No LE edema  Respiratory: CTAB  Abdomen: Obese, soft/NT/ND/+BS  Data Reviewed: Basic Metabolic Panel:  Lab 12/06/11 8119 12/05/11 1958  NA 133* 137  K 3.4* 3.7  CL 99 101  CO2 26 24  GLUCOSE 152* 158*  BUN 13 11  CREATININE 0.65 0.64  CALCIUM 8.8 9.1  MG -- --  PHOS -- --   Liver Function Tests:  Lab 12/05/11 1958  AST 22  ALT 22  ALKPHOS 103  BILITOT 0.2*  PROT 7.9  ALBUMIN 3.5   No results found for this basename: LIPASE:5,AMYLASE:5 in the last 168 hours No results found for this basename: AMMONIA:5 in the last 168 hours CBC:  Lab 12/06/11 0402 12/05/11 1958  WBC 6.2 6.8  NEUTROABS -- 3.1  HGB 12.4 13.2  HCT 38.0 40.1  MCV 79.8 80.8  PLT 286 313   Cardiac Enzymes:  Lab 12/06/11 0402 12/05/11 2327  CKTOTAL 248* --  CKMB 3.6 --  CKMBINDEX -- --  TROPONINI <0.30 <0.30   BNP (last 3 results) No results found for this basename: PROBNP:3 in the last 8760 hours CBG: No results found for this basename: GLUCAP:5 in the last 168  hours  No results found for this or any previous visit (from the past 240 hour(s)).   Studies: Dg Chest 2 View  12/05/2011  *RADIOLOGY REPORT*  Clinical Data: Chest pain and shortness of breath.  Hypertension.  CHEST - 2 VIEW  Comparison: 03/03/2009  Findings: Cardiac and mediastinal contours appear normal.  The lungs appear clear.  No pleural effusion is identified.  Thoracic spondylosis noted.  We partially visualized lower lumbar posterolateral rod and plate and screw fixators.  IMPRESSION:  1.  Thoracic spondylosis.  No acute findings.   Original Report Authenticated By:  Gaylyn Rong, M.D.     Scheduled Meds:    . ALPRAZolam  0.25 mg Oral BID  . [COMPLETED] aspirin  324 mg Oral Once  . aspirin  325 mg Oral Daily  . atorvastatin  10 mg Oral q1800  . diazepam  5 mg Oral Daily  . enoxaparin (LOVENOX) injection  40 mg Subcutaneous Q24H  . gabapentin  300 mg Oral TID  . [COMPLETED] gi cocktail  30 mL Oral Once  . levothyroxine  50 mcg Oral Daily  . metoprolol tartrate  25 mg Oral BID  . metroNIDAZOLE  500 mg Oral BID  . [COMPLETED]  morphine injection  4 mg Intravenous Once  . nabumetone  1,500 mg Oral BID  . nitroGLYCERIN  0.5 inch Topical Q6H  . omega-3 acid ethyl esters  1 g Oral Daily  . pantoprazole  40 mg Oral Daily  . potassium chloride  40 mEq Oral Once  . sodium chloride  3 mL Intravenous Q12H  . valsartan-hydrochlorothiazide  1 tablet Oral Daily   Continuous Infusions:    . sodium chloride      Principal Problem:  *Substernal chest pain Active Problems:  BV (bacterial vaginosis)  Hyperlipidemia  Morbid obesity  Hypertension  Hypokalemia  Hypothyroidism  GERD (gastroesophageal reflux disease)  Anxiety    Time spent: > 35 mins    [facility]  [facility] Pager [phone]. If 8PM-8AM, please contact night-coverage at [email or web address], password [facility] 12/06/2011, 10:07 AM  LOS: 1 day

## 2011-12-06 NOTE — Progress Notes (Signed)
Utilization review completed.  

## 2011-12-06 NOTE — Plan of Care (Signed)
Problem: Phase I Progression Outcomes Goal: Aspirin unless contraindicated Outcome: Completed/Met Date Met:  12/06/11 Pt given ASA as ordered, no c/o c/p, goal met, will continue to monitor  Goal: Voiding-avoid urinary catheter unless indicated Outcome: Completed/Met Date Met:  12/06/11 Pt voiding adequate amt of urine in bathroom, no need for foley, goal met

## 2011-12-06 NOTE — Plan of Care (Signed)
Problem: Phase I Progression Outcomes Goal: Anginal pain relieved Outcome: Completed/Met Date Met:  12/06/11 Pt has not had any c/o cp, or sob, goal met, will continue to monitor  Goal: MD aware of Cardiac Marker results Outcome: Completed/Met Date Met:  12/06/11 MD aware of cardiac markers, MDs doing GI work up, goal met

## 2011-12-06 NOTE — Consult Note (Signed)
Reason for Consult: CP Referring Physician:  Nehal Witting is an 51 y.o. female.  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  She has a history of HTN, HLD, arthritis, hypothyroidism, GERD and anxiety.  Her last nuc stress test was several years ago.  She has not been seen by Dr. Alanda Amass in over 2 years.  She was recently started on an antibiotic for BV.  She developed a dull CP this past Friday and it has been getting progressively worse.  Yesterday it was 8/10 in intensity so she thought she needed evaluation.  She reports associated Nausea and the pain is exacerbated with palpation, inspiration and eating. Also has upper abdominal pain.  She denies SOB, orthopnea, vomiting, fever, diaphoresis, palpitation, cough, congestion, dysuria, hematuria.  Her last BM was yesterday.  No hematochezia or melena.   Past Medical History  Diagnosis Date  . BV (bacterial vaginosis)   . Hypertension   . Hyperlipidemia   . Arthritis   . Hypothyroidism 12/06/2011  . GERD (gastroesophageal reflux disease) 12/06/2011  . Anxiety 12/06/2011    Past Surgical History  Procedure Date  . Cholecystectomy   . Abdominal hysterectomy     History reviewed. No pertinent family history.  Social History:  reports that she has never smoked. She does not have any smokeless tobacco history on file. She reports that she does not drink alcohol or use illicit drugs.  Allergies:  Allergies  Allergen Reactions  . Penicillins Rash  . Vesicare (Solifenacin) Rash    Medications:    . ALPRAZolam  0.25 mg Oral BID  . [COMPLETED] aspirin  324 mg Oral Once  . aspirin  325 mg Oral Daily  . atorvastatin  10 mg Oral q1800  . diazepam  5 mg Oral Daily  . enoxaparin (LOVENOX) injection  40 mg Subcutaneous Q24H  . gabapentin  300 mg Oral TID  . [COMPLETED] gi cocktail  30 mL Oral Once  . hydrochlorothiazide  12.5 mg Oral Daily  . irbesartan  150 mg Oral Daily  .  levothyroxine  50 mcg Oral QAC breakfast  . metoprolol tartrate  25 mg Oral BID  . metroNIDAZOLE  500 mg Oral BID  . [COMPLETED]  morphine injection  4 mg Intravenous Once  . nabumetone  1,500 mg Oral BID  . nitroGLYCERIN  0.5 inch Topical Q6H  . omega-3 acid ethyl esters  1 g Oral Daily  . pantoprazole  40 mg Oral Daily  . [COMPLETED] potassium chloride  40 mEq Oral Once  . sodium chloride  3 mL Intravenous Q12H  . [DISCONTINUED] valsartan-hydrochlorothiazide  1 tablet Oral Daily     Results for orders placed during the hospital encounter of 12/05/11 (from the past 48 hour(s))  CBC WITH DIFFERENTIAL     Status: Normal   Collection Time   12/05/11  7:58 PM      Component Value Range Comment   WBC 6.8  4.0 - 10.5 K/uL    RBC 4.96  3.87 - 5.11 MIL/uL    Hemoglobin 13.2  12.0 - 15.0 g/dL    HCT 98.1  19.1 - 47.8 %    MCV 80.8  78.0 - 100.0 fL    MCH 26.6  26.0 - 34.0 pg    MCHC 32.9  30.0 - 36.0 g/dL    RDW 29.5  62.1 - 30.8 %    Platelets 313  150 - 400 K/uL  Neutrophils Relative 46  43 - 77 %    Neutro Abs 3.1  1.7 - 7.7 K/uL    Lymphocytes Relative 37  12 - 46 %    Lymphs Abs 2.5  0.7 - 4.0 K/uL    Monocytes Relative 12  3 - 12 %    Monocytes Absolute 0.8  0.1 - 1.0 K/uL    Eosinophils Relative 5  0 - 5 %    Eosinophils Absolute 0.3  0.0 - 0.7 K/uL    Basophils Relative 0  0 - 1 %    Basophils Absolute 0.0  0.0 - 0.1 K/uL   COMPREHENSIVE METABOLIC PANEL     Status: Abnormal   Collection Time   12/05/11  7:58 PM      Component Value Range Comment   Sodium 137  135 - 145 mEq/L    Potassium 3.7  3.5 - 5.1 mEq/L    Chloride 101  96 - 112 mEq/L    CO2 24  19 - 32 mEq/L    Glucose, Bld 158 (*) 70 - 99 mg/dL    BUN 11  6 - 23 mg/dL    Creatinine, Ser 1.61  0.50 - 1.10 mg/dL    Calcium 9.1  8.4 - 09.6 mg/dL    Total Protein 7.9  6.0 - 8.3 g/dL    Albumin 3.5  3.5 - 5.2 g/dL    AST 22  0 - 37 U/L    ALT 22  0 - 35 U/L    Alkaline Phosphatase 103  39 - 117 U/L    Total  Bilirubin 0.2 (*) 0.3 - 1.2 mg/dL    GFR calc non Af Amer >90  >90 mL/min    GFR calc Af Amer >90  >90 mL/min   POCT I-STAT TROPONIN I     Status: Normal   Collection Time   12/05/11  8:07 PM      Component Value Range Comment   Troponin i, poc 0.00  0.00 - 0.08 ng/mL    Comment 3            TROPONIN I     Status: Normal   Collection Time   12/05/11 11:27 PM      Component Value Range Comment   Troponin I <0.30  <0.30 ng/mL   BASIC METABOLIC PANEL     Status: Abnormal   Collection Time   12/06/11  4:02 AM      Component Value Range Comment   Sodium 133 (*) 135 - 145 mEq/L    Potassium 3.4 (*) 3.5 - 5.1 mEq/L    Chloride 99  96 - 112 mEq/L    CO2 26  19 - 32 mEq/L    Glucose, Bld 152 (*) 70 - 99 mg/dL    BUN 13  6 - 23 mg/dL    Creatinine, Ser 0.45  0.50 - 1.10 mg/dL    Calcium 8.8  8.4 - 40.9 mg/dL    GFR calc non Af Amer >90  >90 mL/min    GFR calc Af Amer >90  >90 mL/min   CBC     Status: Normal   Collection Time   12/06/11  4:02 AM      Component Value Range Comment   WBC 6.2  4.0 - 10.5 K/uL    RBC 4.76  3.87 - 5.11 MIL/uL    Hemoglobin 12.4  12.0 - 15.0 g/dL    HCT 81.1  91.4 - 78.2 %  MCV 79.8  78.0 - 100.0 fL    MCH 26.1  26.0 - 34.0 pg    MCHC 32.6  30.0 - 36.0 g/dL    RDW 62.1  30.8 - 65.7 %    Platelets 286  150 - 400 K/uL   TROPONIN I     Status: Normal   Collection Time   12/06/11  4:02 AM      Component Value Range Comment   Troponin I <0.30  <0.30 ng/mL   CK TOTAL AND CKMB     Status: Abnormal   Collection Time   12/06/11  4:02 AM      Component Value Range Comment   Total CK 248 (*) 7 - 177 U/L    CK, MB 3.6  0.3 - 4.0 ng/mL    Relative Index 1.5  0.0 - 2.5   D-DIMER, QUANTITATIVE     Status: Normal   Collection Time   12/06/11 10:00 AM      Component Value Range Comment   D-Dimer, Quant 0.42  0.00 - 0.48 ug/mL-FEU   MAGNESIUM     Status: Normal   Collection Time   12/06/11 10:00 AM      Component Value Range Comment   Magnesium 2.2  1.5  - 2.5 mg/dL   LIPID PANEL     Status: Normal   Collection Time   12/06/11 10:00 AM      Component Value Range Comment   Cholesterol 142  0 - 200 mg/dL    Triglycerides 61  <846 mg/dL    HDL 71  >96 mg/dL    Total CHOL/HDL Ratio 2.0      VLDL 12  0 - 40 mg/dL    LDL Cholesterol 59  0 - 99 mg/dL   TROPONIN I     Status: Normal   Collection Time   12/06/11 10:06 AM      Component Value Range Comment   Troponin I <0.30  <0.30 ng/mL   CK TOTAL AND CKMB     Status: Normal (Preliminary result)   Collection Time   12/06/11 10:06 AM      Component Value Range Comment   Total CK PENDING  7 - 177 U/L    CK, MB 3.6  0.3 - 4.0 ng/mL    Relative Index PENDING  0.0 - 2.5     Dg Chest 2 View  12/05/2011  *RADIOLOGY REPORT*  Clinical Data: Chest pain and shortness of breath.  Hypertension.  CHEST - 2 VIEW  Comparison: 03/03/2009  Findings: Cardiac and mediastinal contours appear normal.  The lungs appear clear.  No pleural effusion is identified.  Thoracic spondylosis noted.  We partially visualized lower lumbar posterolateral rod and plate and screw fixators.  IMPRESSION:  1.  Thoracic spondylosis.  No acute findings.   Original Report Authenticated By: Gaylyn Rong, M.D.     Review of Systems  Constitutional: Negative for fever and diaphoresis.  HENT: Negative for congestion and sore throat.   Eyes: Negative for blurred vision and double vision.  Respiratory: Negative for cough and shortness of breath.   Cardiovascular: Positive for chest pain. Negative for palpitations, orthopnea and leg swelling.  Gastrointestinal: Positive for nausea and abdominal pain. Negative for vomiting, diarrhea, constipation, blood in stool and melena.  Genitourinary: Negative for dysuria and hematuria.  Musculoskeletal: Myalgias: left shoulder pain which she has had intermittently prior to the CP.  Neurological: Negative for dizziness.   Blood pressure 112/70, pulse 88, temperature 97.7  F (36.5 C),  temperature source Oral, resp. rate 18, height 5\' 4"  (1.626 m), weight 126.735 kg (279 lb 6.4 oz), SpO2 98.00%. Physical Exam  Constitutional: She is oriented to person, place, and time. She appears well-developed and well-nourished. No distress.  HENT:  Head: Normocephalic and atraumatic.  Eyes: EOM are normal. Pupils are equal, round, and reactive to light. No scleral icterus.  Neck: Normal range of motion. Neck supple.  Cardiovascular: Normal rate, regular rhythm and S2 normal.   No murmur heard. Pulses:      Radial pulses are 2+ on the right side, and 2+ on the left side.       Dorsalis pedis pulses are 2+ on the right side, and 2+ on the left side.       No Carotid Bruit.  Respiratory: Effort normal and breath sounds normal. She has no wheezes. She has no rales.  GI: Soft. Bowel sounds are normal. There is tenderness (Tender in the epigastric area, 3-4/10.  Negative Murphy's sign.).  Musculoskeletal: She exhibits no edema.  Lymphadenopathy:    She has no cervical adenopathy.  Neurological: She is alert and oriented to person, place, and time. She exhibits normal muscle tone.  Skin: Skin is warm.  Psychiatric: She has a normal mood and affect.    Assessment/Plan: Patient Active Hospital Problem List: Substernal chest pain (12/06/2011) BV (bacterial vaginosis) () Hyperlipidemia () Morbid obesity (12/06/2011) Hypertension (12/06/2011) Hypokalemia (12/06/2011) Hypothyroidism (12/06/2011) GERD (gastroesophageal reflux disease) (12/06/2011) Anxiety (12/06/2011) Hypokalemia Obesity  Plan: I do not think this is her heart.  She is having Atypical CP which is more epigastric and exacerbated with palpation and eating.  Unknown if fatty foods were worse. Total CK mildly elevated.  Troponin and CKMB negative x 3.  Liver enzymes WNL.  She does have TWI in V1 and V3.  GI cocktail did not help.  Morphine did.  Will check a lipase.  Replaced K+.     Dale Ribeiro 12/06/2011, 11:19 AM

## 2011-12-06 NOTE — Consult Note (Addendum)
Pt. Seen and examined. Agree with the NP/PA-C note as written.  Well known to [place] with history of non-cardiac chest pain and mild CAD by cath in a single vessel in 2007. Followed by Dr. Alanda Amass but not seen in several years. Recently has had dull chest pain which is worse with palpation and eating. She is s/p cholecystectomy in the past. She also was on antibiotics recently for BV. EKG shows subtle non-specific TWI's. Troponin is negative x 3. Lipase is elevated at 218. Check LFT's.  Tender over the mid-epigastrium, but no Murphy's sign.  Will obtain an abdominal ultrasound today to look for any hepatobiliary pathology that may be contributing to her symptoms. May need GI consult.  Nothing clearly points to a cardiac etiology.  Will assume care of [name] on our service. Thanks for hospitalist assistance.  Chrystie Nose, MD, [facility] Attending Cardiologist [facility]

## 2011-12-06 NOTE — H&P (Signed)
[facility] History and Physical  [name] [name]:[id number] DOB: 25-Feb-1960 DOA: 12/05/2011  Referring physician: EDP PCP: Thora Lance, MD  Specialists: [facility]    Chief Complaint: Chest pain  HPI: [name] is a 51 y.o. female who presents to the ED with complaints of intermittent SSCP for the past 3 days.  The pain worsened PTA, and was an 8/10 and has been a dull and indigestion like sensation.  She reports taking Gas-X at home without relief of her symptoms.  She denies any SOB, nausea or vomiting or diaphoresis.  In the ED the pain was not relieved after nitroglycerin SL, or a GI cocktail, but was relieved after administration of IV Morphine X 1 dose.  She reports having a cardiac workup 5 years ago by [facility] Dr. Alanda Amass with negative results.  In the ED, the initial cardiac enzymes have been negative X 2.   She was referred for medical admission.     Review of Systems: The patient denies anorexia, fever, weight loss, vision loss, decreased hearing, hoarseness, syncope, dyspnea on exertion, peripheral edema, balance deficits, hemoptysis, abdominal pain, melena, hematochezia, hematuria, incontinence, genital sores, muscle weakness, suspicious skin lesions, transient blindness, difficulty walking, depression, unusual weight change, abnormal bleeding, enlarged lymph nodes, angioedema, and breast masses.    Past Medical History  Diagnosis Date  . BV (bacterial vaginosis)   . Hypertension   . Hyperlipidemia   . Arthritis    Past Surgical History  Procedure Date  . Cholecystectomy   . Abdominal hysterectomy     Allergies  Allergen Reactions  . Penicillins Rash  . Vesicare (Solifenacin) Rash     Medications:  HOME MEDS: Prior to Admission medications   Medication Sig Start Date End Date Taking? Authorizing Provider  ALPRAZolam (XANAX) 0.25 MG tablet Take 0.25 mg by mouth 2 (two) times daily.   Yes Historical Provider, MD  aspirin EC 81 MG tablet Take  81 mg by mouth daily.   Yes Historical Provider, MD  diazepam (VALIUM) 5 MG tablet Take 5 mg by mouth daily.   Yes Historical Provider, MD  esomeprazole (NEXIUM) 40 MG capsule Take 40 mg by mouth daily before breakfast.   Yes Historical Provider, MD  gabapentin (NEURONTIN) 300 MG capsule Take 300 mg by mouth 3 (three) times daily.   Yes Historical Provider, MD  HYDROcodone-acetaminophen (NORCO) 10-325 MG per tablet Take 1 tablet by mouth every 6 (six) hours as needed. For pain   Yes Historical Provider, MD  levothyroxine (SYNTHROID, LEVOTHROID) 50 MCG tablet Take 50 mcg by mouth daily.   Yes Historical Provider, MD  metoprolol tartrate (LOPRESSOR) 25 MG tablet Take 25 mg by mouth 2 (two) times daily.   Yes Historical Provider, MD  metroNIDAZOLE (FLAGYL) 500 MG tablet Take 500 mg by mouth 2 (two) times daily. 11/30/11 12/07/11 Yes Historical Provider, MD  nabumetone (RELAFEN) 750 MG tablet Take 1,500 mg by mouth 2 (two) times daily.   Yes Historical Provider, MD  omega-3 acid ethyl esters (LOVAZA) 1 G capsule Take 1 g by mouth daily.   Yes Historical Provider, MD  Pitavastatin Calcium 1 MG TABS Take 1 mg by mouth daily.   Yes Historical Provider, MD  Polyethyl Glycol-Propyl Glycol (SYSTANE OP) Place 1 drop into both eyes daily as needed. For dry eyes   Yes Historical Provider, MD  Simethicone (GAS-X PO) Take 2 tablets by mouth daily as needed. For gas   Yes Historical Provider, MD  valsartan-hydrochlorothiazide (DIOVAN-HCT) 160-12.5 MG per  tablet Take 1 tablet by mouth daily.   Yes Historical Provider, MD     Social History:  reports that she has never smoked. She does not have any smokeless tobacco history on file. She reports that she does not drink alcohol or use illicit drugs.   History reviewed. No pertinent family history.   Physical Exam:  GEN:  Pleasant  51 year old Obese African American Female examined  and in no acute distress; cooperative with exam Filed Vitals:   12/05/11 1954  12/05/11 2331 12/06/11 0304  BP: 148/92 153/84 121/71  Pulse: 81    Temp: 97.9 F (36.6 C)    TempSrc: Oral    Resp: 16 13 15   SpO2: 96% 97% 98%   Blood pressure 121/71, pulse 81, temperature 97.9 F (36.6 C), temperature source Oral, resp. rate 15, SpO2 98.00%. PSYCH: She is alert and oriented x4; does not appear anxious does not appear depressed; affect is normal HEENT: Normocephalic and Atraumatic, Mucous membranes pink; PERRLA; EOM intact; Fundi:  Benign;  No scleral icterus, Nares: Patent, Oropharynx: Clear, Fair Dentition, Neck:  FROM, no cervical lymphadenopathy nor thyromegaly or carotid bruit; no JVD; Breasts:: Not examined CHEST WALL: No tenderness CHEST: Normal respiration, clear to auscultation bilaterally HEART: Regular rate and rhythm; no murmurs rubs or gallops BACK: No kyphosis or scoliosis; no CVA tenderness ABDOMEN: Positive Bowel Sounds, Obese, soft non-tender; no masses, no organomegaly.    Rectal Exam: Not done EXTREMITIES: No bone or joint deformity; age-appropriate arthropathy of the hands and knees; no cyanosis, clubbing or edema; no ulcerations. Genitalia: not examined PULSES: 2+ and symmetric SKIN: Normal hydration no rash or ulceration CNS: Cranial nerves 2-12 grossly intact no focal neurologic deficit   Labs on Admission:  Basic Metabolic Panel:  Lab 12/05/11 9811  NA 137  K 3.7  CL 101  CO2 24  GLUCOSE 158*  BUN 11  CREATININE 0.64  CALCIUM 9.1  MG --  PHOS --   Liver Function Tests:  Lab 12/05/11 1958  AST 22  ALT 22  ALKPHOS 103  BILITOT 0.2*  PROT 7.9  ALBUMIN 3.5   No results found for this basename: LIPASE:5,AMYLASE:5 in the last 168 hours No results found for this basename: AMMONIA:5 in the last 168 hours CBC:  Lab 12/05/11 1958  WBC 6.8  NEUTROABS 3.1  HGB 13.2  HCT 40.1  MCV 80.8  PLT 313   Cardiac Enzymes:  Lab 12/05/11 2327  CKTOTAL --  CKMB --  CKMBINDEX --  TROPONINI <0.30    BNP (last 3 results) No  results found for this basename: PROBNP:3 in the last 8760 hours CBG: No results found for this basename: GLUCAP:5 in the last 168 hours  Radiological Exams on Admission: Dg Chest 2 View  12/05/2011  *RADIOLOGY REPORT*  Clinical Data: Chest pain and shortness of breath.  Hypertension.  CHEST - 2 VIEW  Comparison: 03/03/2009  Findings: Cardiac and mediastinal contours appear normal.  The lungs appear clear.  No pleural effusion is identified.  Thoracic spondylosis noted.  We partially visualized lower lumbar posterolateral rod and plate and screw fixators.  IMPRESSION:  1.  Thoracic spondylosis.  No acute findings.   Original Report Authenticated By: Gaylyn Rong, M.D.     EKG: Independently reviewed. Normal sinus Rhythm with T wave inversions in the lateral leads  Assessment/Plan Principal Problem:  *Substernal chest pain Active Problems:  Hyperlipidemia  Hypertension  BV (bacterial vaginosis)  Morbid obesity   Plan:  Admit to Telemetry Bed for Observation Cardiac Enzymes Nitropaste, ASA, and O2 , Protonix SEHV to see in AM Reconcile Home Medications DVT prophylaxis    Code Status:  FULL CODE Family Communication: Son at Bedside Disposition Plan: Return to Home  Time spent:  47 Minutes     Ron Parker [facility] Pager [phone]  If 7PM-7AM, please contact night-coverage [email or web address] Password [email or web address] 12/06/2011, 4:08 AM

## 2011-12-06 NOTE — ED Notes (Signed)
Attempted to call report, nurse unavailable.  Was told they'll call back shortly.

## 2011-12-07 ENCOUNTER — Observation Stay (HOSPITAL_COMMUNITY): Payer: BC Managed Care – PPO

## 2011-12-07 ENCOUNTER — Encounter (HOSPITAL_COMMUNITY): Payer: Self-pay | Admitting: Radiology

## 2011-12-07 LAB — COMPREHENSIVE METABOLIC PANEL
Albumin: 3.2 g/dL — ABNORMAL LOW (ref 3.5–5.2)
Alkaline Phosphatase: 112 U/L (ref 39–117)
BUN: 11 mg/dL (ref 6–23)
Calcium: 9.1 mg/dL (ref 8.4–10.5)
Potassium: 3.8 mEq/L (ref 3.5–5.1)
Sodium: 136 mEq/L (ref 135–145)
Total Protein: 7.5 g/dL (ref 6.0–8.3)

## 2011-12-07 LAB — AMYLASE: Amylase: 120 U/L — ABNORMAL HIGH (ref 0–105)

## 2011-12-07 LAB — CBC
HCT: 38.4 % (ref 36.0–46.0)
MCH: 26.4 pg (ref 26.0–34.0)
MCHC: 32.3 g/dL (ref 30.0–36.0)
RDW: 14.9 % (ref 11.5–15.5)

## 2011-12-07 MED ORDER — POLYETHYLENE GLYCOL 3350 17 G PO PACK
17.0000 g | PACK | Freq: Two times a day (BID) | ORAL | Status: DC
  Administered 2011-12-07 – 2011-12-09 (×5): 17 g via ORAL
  Filled 2011-12-07 (×9): qty 1

## 2011-12-07 MED ORDER — IOHEXOL 300 MG/ML  SOLN
80.0000 mL | Freq: Once | INTRAMUSCULAR | Status: AC | PRN
  Administered 2011-12-07: 80 mL via INTRAVENOUS

## 2011-12-07 MED ORDER — IOHEXOL 300 MG/ML  SOLN
20.0000 mL | INTRAMUSCULAR | Status: AC
  Administered 2011-12-07 (×2): 20 mL via ORAL

## 2011-12-07 NOTE — Progress Notes (Signed)
Patient is currently being transported to radiology. D. Taje Tondreau RN 

## 2011-12-07 NOTE — Progress Notes (Signed)
Reassessed patient's chest pain and patient states that it "helped a little", will continue to monitor patient.  Lorretta Harp RN

## 2011-12-07 NOTE — Progress Notes (Signed)
[facility]  Subjective: Pt reports mild substernal chest pain/epigastric 1 hr after eating breakfast this morning.  Dull in nature. Non radiating.  5/10 at onset. Relieved by NTG. Pain is currently 2/10. Denies SOB, orthopnea, diaphoresis, n/v. Pt. Reports feeling constipated. Last BM was 11/10.  She is passing flatus. 1 dose of Mylanta was admistered yesterday evening.   Objective: Vital signs in last 24 hours: Temp:  [97.2 F (36.2 C)-97.9 F (36.6 C)] 97.2 F (36.2 C) (11/12 0541) Pulse Rate:  [68-72] 70  (11/12 1049) Resp:  [18-20] 20  (11/12 1049) BP: (102-129)/(56-85) 129/85 mmHg (11/12 1049) SpO2:  [97 %-100 %] 97 % (11/12 1049) Weight:  [126.1 kg (278 lb)] 126.1 kg (278 lb) (11/12 0541) Last BM Date: 12/05/11  Intake/Output from previous day: 11/11 0701 - 11/12 0700 In: 360 [P.O.:360] Out: 2350 [Urine:2350] Intake/Output this shift: Total I/O In: 483 [P.O.:480; I.V.:3] Out: -   Medications Current Facility-Administered Medications  Medication Dose Route Frequency Provider Last Rate Last Dose  . 0.9 %  sodium chloride infusion   Intravenous Continuous Ron Parker, MD      . acetaminophen (TYLENOL) tablet 650 mg  650 mg Oral Q6H PRN Ron Parker, MD       Or  . acetaminophen (TYLENOL) suppository 650 mg  650 mg Rectal Q6H PRN Ron Parker, MD      . ALPRAZolam Prudy Feeler) tablet 0.25 mg  0.25 mg Oral BID Rodolph Bong, MD   0.25 mg at 12/07/11 1054  . alum & mag hydroxide-simeth (MAALOX/MYLANTA) 200-200-20 MG/5ML suspension 30 mL  30 mL Oral Q6H PRN Ron Parker, MD      . aspirin tablet 325 mg  325 mg Oral Daily Ron Parker, MD   325 mg at 12/07/11 1055  . atorvastatin (LIPITOR) tablet 10 mg  10 mg Oral q1800 Rodolph Bong, MD   10 mg at 12/06/11 1707  . diazepam (VALIUM) tablet 5 mg  5 mg Oral Daily Rodolph Bong, MD   5 mg at 12/07/11 1058  . enoxaparin (LOVENOX) injection 40 mg  40 mg  Subcutaneous Q24H Ron Parker, MD   40 mg at 12/06/11 1339  . gabapentin (NEURONTIN) capsule 300 mg  300 mg Oral TID Rodolph Bong, MD   300 mg at 12/07/11 1054  . gi cocktail (Maalox,Lidocaine,Donnatal)  30 mL Oral TID PRN Rodolph Bong, MD      . hydrochlorothiazide (HYDRODIURIL) tablet 12.5 mg  12.5 mg Oral Daily Rodolph Bong, MD   12.5 mg at 12/07/11 1000  . HYDROcodone-acetaminophen (NORCO) 10-325 MG per tablet 1 tablet  1 tablet Oral Q6H PRN Rodolph Bong, MD      . HYDROmorphone (DILAUDID) injection 0.5-1 mg  0.5-1 mg Intravenous Q3H PRN Ron Parker, MD   1 mg at 12/06/11 0636  . irbesartan (AVAPRO) tablet 150 mg  150 mg Oral Daily Rodolph Bong, MD   150 mg at 12/07/11 1055  . levothyroxine (SYNTHROID, LEVOTHROID) tablet 50 mcg  50 mcg Oral QAC breakfast Rodolph Bong, MD   50 mcg at 12/07/11 0654  . magnesium hydroxide (MILK OF MAGNESIA) suspension 15 mL  15 mL Oral Daily PRN Wilburt Finlay, PA   15 mL at 12/06/11 2238  . metoprolol tartrate (LOPRESSOR) tablet 25 mg  25 mg Oral BID Rodolph Bong, MD   25 mg at 12/07/11 1056  . metroNIDAZOLE (FLAGYL) tablet 500  mg  500 mg Oral BID Rodolph Bong, MD   500 mg at 12/07/11 1055  . nabumetone (RELAFEN) tablet 1,500 mg  1,500 mg Oral BID Rodolph Bong, MD   1,500 mg at 12/07/11 1057  . nitroGLYCERIN (NITROGLYN) 2 % ointment 0.5 inch  0.5 inch Topical Q6H Ron Parker, MD   0.5 inch at 12/06/11 1121  . nitroGLYCERIN (NITROSTAT) SL tablet 0.4 mg  0.4 mg Sublingual Q5 Min x 3 PRN Olivia Mackie, MD   0.4 mg at 12/07/11 1103  . omega-3 acid ethyl esters (LOVAZA) capsule 1 g  1 g Oral Daily Rodolph Bong, MD   1 g at 12/07/11 1055  . ondansetron (ZOFRAN) tablet 4 mg  4 mg Oral Q6H PRN Ron Parker, MD       Or  . ondansetron (ZOFRAN) injection 4 mg  4 mg Intravenous Q6H PRN Ron Parker, MD      . oxyCODONE (Oxy IR/ROXICODONE) immediate release tablet 5 mg  5 mg Oral Q4H PRN Ron Parker, MD      . pantoprazole (PROTONIX) EC tablet 40 mg  40 mg Oral Daily Ron Parker, MD   40 mg at 12/07/11 1055  . polyvinyl alcohol (LIQUIFILM TEARS) 1.4 % ophthalmic solution 1 drop  1 drop Both Eyes Daily PRN Rodolph Bong, MD      . simethicone [facility]) chewable tablet 160 mg  160 mg Oral QID PRN Rodolph Bong, MD      . sodium chloride 0.9 % injection 3 mL  3 mL Intravenous Q12H Ron Parker, MD   3 mL at 12/07/11 1058  . zolpidem (AMBIEN) tablet 5 mg  5 mg Oral QHS PRN Ron Parker, MD        PE: General appearance: alert, cooperative and no distress Lungs: clear to auscultation bilaterally and no labored breathing Heart: regular rate and rhythm, S1, S2 normal, no murmur, click, rub or gallop Abdomen: soft, moderately tender in the epigastric and LUQ, + BS Extremities: no LEE Pulses: 2+ and symmetric Skin:  warm and dry Neurologic: Grossly normal  Lab Results:   Basename 12/07/11 0600 12/06/11 0402 12/05/11 1958  WBC 5.8 6.2 6.8  HGB 12.4 12.4 13.2  HCT 38.4 38.0 40.1  PLT 300 286 313   BMET  Basename 12/07/11 0600 12/06/11 0402 12/05/11 1958  NA 136 133* 137  K 3.8 3.4* 3.7  CL 98 99 101  CO2 27 26 24   GLUCOSE 239* 152* 158*  BUN 11 13 11   CREATININE 0.64 0.65 0.64  CALCIUM 9.1 8.8 9.1   PT/INR No results found for this basename: LABPROT:3,INR:3 in the last 72 hours Cholesterol  Basename 12/06/11 1000  CHOL 142   Lipid Panel     Component Value Date/Time   CHOL 142 12/06/2011 1000   TRIG 61 12/06/2011 1000   HDL 71 12/06/2011 1000   CHOLHDL 2.0 12/06/2011 1000   VLDL 12 12/06/2011 1000   LDLCALC 59 12/06/2011 1000   Cardiac Panel (last 3 results)  Basename 12/06/11 1006 12/06/11 0402 12/05/11 2327  CKTOTAL 250* 248* --  CKMB 3.6 3.6 --  TROPONINI <0.30 <0.30 <0.30  RELINDX 1.4 1.5 --   Cardiac Enzymes No components found with this basename: TROPONIN:3, CKMB:3  Studies/Results: RADIOLOGY REPORT*  Clinical  Data: Epigastric pain. Elevated serum lipase. Prior  cholecystectomy.  ABDOMINAL ULTRASOUND COMPLETE  Comparison: None.  Findings:  Gallbladder: Surgically absent.  Common Bile  Duct: Within normal limits in caliber. Measures 3 mm  in diameter.  Liver: Diffusely increased parenchymal echogenicity, consistent  with hepatic steatosis. No focal liver mass identified.  IVC: Appears normal.  Pancreas: Although assessment of the pancreas by sonography is  inherently limited, no definite abnormality identified.  Spleen: Within normal limits in size and echotexture.  Right kidney: Normal in size and parenchymal echogenicity. No  evidence of mass or hydronephrosis.  Left kidney: Normal in size and parenchymal echogenicity. No  evidence of mass or hydronephrosis.  Abdominal Aorta: No aneurysm identified.  IMPRESSION:  1. Prior cholecystectomy. No evidence of biliary dilatation,  hydronephrosis, or other acute findings.  2. Hepatic steatosis.  Original Report Authenticated By: Myles Rosenthal, M.D.   Assessment/Plan  Principal Problem:  *Substernal chest pain Active Problems:  BV (bacterial vaginosis)  Hyperlipidemia  Morbid obesity  Hypertension  Hypokalemia  Hypothyroidism  GERD (gastroesophageal reflux disease)  Anxiety  Plan: Continues to have mild, episodic substernal CP.  BP stable. Lipase elevated and increased today.  Will make NPO.  The pancrease was not visualized well on the Ultrasound.  Will order CT of the Abdomin.  GI consult.   LOS: 2 days    HAGER, BRYAN 12/07/2011 11:10 AM   Agree with note written by Jones Skene [facility]  Being treated appropriately for pancreatitis with increased lipase. Appreciate DR. Schooler's input.  Runell Gess 12/07/2011 5:07 PM

## 2011-12-07 NOTE — Progress Notes (Signed)
Patient complained of "chest pain", states that she is unsure if it is gas or chest pain.  Patient given one nitroglycerin sublingual; will continue to monitor patient.  Lorretta Harp RN

## 2011-12-07 NOTE — Progress Notes (Signed)
Pt complained of having difficulty passing stool, stating that she continues to feel the urge but just couldn't void. MD notified and new order given for MOM. Given and results pending.

## 2011-12-07 NOTE — Consult Note (Signed)
Referring Provider: Dr. Rennis Golden Primary Care Physician:  Thora Lance, MD Primary Gastroenterologist:  Gentry Fitz  Reason for Consultation:  Abdominal pain  HPI: [name] is a 51 y.o. female who had the acute onset of sharp postprandial epigastric pain that started last Friday that also occurred in her substernal area. Pain would be severe and sharp and would occur shortly after eating. On Sunday the pain worsened to an 8-9/10 in intensity. Associated nausea without vomiting. BMs have slowed to none since Sunday. Pain similar to what she had around the time her GB was removed (in her 20's). Denies any heartburn. Denies melena or hematochezia. Denies any history of pancreatitis. Denies any history of ulcers and has never had an endoscopy. Denies alcohol. U/S showed no CBD dilation. Lipase 218 yesterday and now 249.  Past Medical History  Diagnosis Date  . BV (bacterial vaginosis)   . Hypertension   . Hyperlipidemia   . Arthritis   . Hypothyroidism 12/06/2011  . GERD (gastroesophageal reflux disease) 12/06/2011  . Anxiety 12/06/2011    Past Surgical History  Procedure Date  . Cholecystectomy   . Abdominal hysterectomy     Prior to Admission medications   Medication Sig Start Date End Date Taking? Authorizing Provider  ALPRAZolam (XANAX) 0.25 MG tablet Take 0.25 mg by mouth 2 (two) times daily.   Yes Historical Provider, MD  aspirin EC 81 MG tablet Take 81 mg by mouth daily.   Yes Historical Provider, MD  diazepam (VALIUM) 5 MG tablet Take 5 mg by mouth daily.   Yes Historical Provider, MD  esomeprazole (NEXIUM) 40 MG capsule Take 40 mg by mouth daily before breakfast.   Yes Historical Provider, MD  gabapentin (NEURONTIN) 300 MG capsule Take 300 mg by mouth 3 (three) times daily.   Yes Historical Provider, MD  HYDROcodone-acetaminophen (NORCO) 10-325 MG per tablet Take 1 tablet by mouth every 6 (six) hours as needed. For pain   Yes Historical Provider, MD  levothyroxine  (SYNTHROID, LEVOTHROID) 50 MCG tablet Take 50 mcg by mouth daily.   Yes Historical Provider, MD  metoprolol tartrate (LOPRESSOR) 25 MG tablet Take 25 mg by mouth 2 (two) times daily.   Yes Historical Provider, MD  metroNIDAZOLE (FLAGYL) 500 MG tablet Take 500 mg by mouth 2 (two) times daily. 11/30/11 12/07/11 Yes Historical Provider, MD  nabumetone (RELAFEN) 750 MG tablet Take 1,500 mg by mouth 2 (two) times daily.   Yes Historical Provider, MD  omega-3 acid ethyl esters (LOVAZA) 1 G capsule Take 1 g by mouth daily.   Yes Historical Provider, MD  Pitavastatin Calcium 1 MG TABS Take 1 mg by mouth daily.   Yes Historical Provider, MD  Polyethyl Glycol-Propyl Glycol (SYSTANE OP) Place 1 drop into both eyes daily as needed. For dry eyes   Yes Historical Provider, MD  Simethicone (GAS-X PO) Take 2 tablets by mouth daily as needed. For gas   Yes Historical Provider, MD  valsartan-hydrochlorothiazide (DIOVAN-HCT) 160-12.5 MG per tablet Take 1 tablet by mouth daily.   Yes Historical Provider, MD    Scheduled Meds:   . ALPRAZolam  0.25 mg Oral BID  . aspirin  325 mg Oral Daily  . atorvastatin  10 mg Oral q1800  . diazepam  5 mg Oral Daily  . enoxaparin (LOVENOX) injection  40 mg Subcutaneous Q24H  . gabapentin  300 mg Oral TID  . hydrochlorothiazide  12.5 mg Oral Daily  . iohexol  20 mL Oral Q1 Hr x 2  .  irbesartan  150 mg Oral Daily  . levothyroxine  50 mcg Oral QAC breakfast  . metoprolol tartrate  25 mg Oral BID  . metroNIDAZOLE  500 mg Oral BID  . nabumetone  1,500 mg Oral BID  . nitroGLYCERIN  0.5 inch Topical Q6H  . omega-3 acid ethyl esters  1 g Oral Daily  . pantoprazole  40 mg Oral Daily  . polyethylene glycol  17 g Oral BID  . sodium chloride  3 mL Intravenous Q12H   Continuous Infusions:   . sodium chloride     PRN Meds:.acetaminophen, acetaminophen, alum & mag hydroxide-simeth, gi cocktail, HYDROcodone-acetaminophen, HYDROmorphone (DILAUDID) injection, magnesium hydroxide,  nitroGLYCERIN, ondansetron (ZOFRAN) IV, ondansetron, oxyCODONE, polyvinyl alcohol, simethicone, zolpidem  Allergies as of 12/05/2011 - Review Complete 12/05/2011  Allergen Reaction Noted  . Penicillins Rash 12/05/2011  . Vesicare (solifenacin) Rash 12/05/2011    History reviewed. No pertinent family history.  History   Social History  . Marital Status: Married    Spouse Name: N/A    Number of Children: N/A  . Years of Education: N/A   Occupational History  . Not on file.   Social History Main Topics  . Smoking status: Never Smoker   . Smokeless tobacco: Not on file  . Alcohol Use: No  . Drug Use: No  . Sexually Active: Not on file   Other Topics Concern  . Not on file   Social History Narrative  . No narrative on file    Review of Systems: All negative except as stated above in HPI.  Physical Exam: Vital signs: Filed Vitals:   12/07/11 1300  BP: 147/98  Pulse: 80  Temp: 97.2 F (36.2 C)  Resp: 19   Last BM Date: 12/05/11 General:   Alert,  Morbidly obese, NAD Lungs:  Clear throughout to auscultation.   No wheezes, crackles, or rhonchi. No acute distress. Heart:  Regular rate and rhythm; no murmurs, clicks, rubs,  or gallops. Abdomen: epigastric tenderness with guarding, otherwise nontender, soft, nondistended, +BS  Rectal:  Deferred  GI:  Lab Results:  Basename 12/07/11 0600 12/06/11 0402 12/05/11 1958  WBC 5.8 6.2 6.8  HGB 12.4 12.4 13.2  HCT 38.4 38.0 40.1  PLT 300 286 313   BMET  Basename 12/07/11 0600 12/06/11 0402 12/05/11 1958  NA 136 133* 137  K 3.8 3.4* 3.7  CL 98 99 101  CO2 27 26 24   GLUCOSE 239* 152* 158*  BUN 11 13 11   CREATININE 0.64 0.65 0.64  CALCIUM 9.1 8.8 9.1   LFT  Basename 12/07/11 0600  PROT 7.5  ALBUMIN 3.2*  AST 27  ALT 25  ALKPHOS 112  BILITOT 0.4  BILIDIR --  IBILI --   PT/INR No results found for this basename: LABPROT:2,INR:2 in the last 72 hours   Studies/Results: Dg Chest 2 View  12/05/2011   *RADIOLOGY REPORT*  Clinical Data: Chest pain and shortness of breath.  Hypertension.  CHEST - 2 VIEW  Comparison: 03/03/2009  Findings: Cardiac and mediastinal contours appear normal.  The lungs appear clear.  No pleural effusion is identified.  Thoracic spondylosis noted.  We partially visualized lower lumbar posterolateral rod and plate and screw fixators.  IMPRESSION:  1.  Thoracic spondylosis.  No acute findings.   Original Report Authenticated By: Gaylyn Rong, M.D.    US Abdomen Complete  12/06/2011  *RADIOLOGY REPORT*  Clinical Data:  Epigastric pain.  Elevated serum lipase.  Prior cholecystectomy.  ABDOMINAL ULTRASOUND COMPLETE  Comparison:  None.  Findings:  Gallbladder:  Surgically absent.  Common Bile Duct:  Within normal limits in caliber. Measures 3 mm in diameter.  Liver: Diffusely increased parenchymal echogenicity, consistent with hepatic steatosis.  No focal liver mass identified.  IVC:  Appears normal.  Pancreas:  Although assessment of the pancreas by sonography is inherently limited, no definite abnormality identified.  Spleen:  Within normal limits in size and echotexture.  Right kidney:  Normal in size and parenchymal echogenicity.  No evidence of mass or hydronephrosis.  Left kidney:  Normal in size and parenchymal echogenicity.  No evidence of mass or hydronephrosis.  Abdominal Aorta:  No aneurysm identified.  IMPRESSION:  1.  Prior cholecystectomy.  No evidence of biliary dilatation, hydronephrosis, or other acute findings. 2.  Hepatic steatosis.   Original Report Authenticated By: Myles Rosenthal, M.D.     Impression/Plan: 51yo with epigastric pain and elevated lipase consistent with pancreatitis. Agree with CT to evaluate for pancreatic inflammation. Strict NPOs. I do not think she needs an EGD based on this clinical evidence of pancreatitis as a source of her symptoms. Will follow.    LOS: 2 days   Demetre Monaco C.  12/07/2011, 2:33 PM

## 2011-12-07 NOTE — Progress Notes (Signed)
Patient has arrived to unit from radiology.  Lorretta Harp  RN

## 2011-12-08 DIAGNOSIS — K859 Acute pancreatitis without necrosis or infection, unspecified: Secondary | ICD-10-CM | POA: Diagnosis present

## 2011-12-08 DIAGNOSIS — K760 Fatty (change of) liver, not elsewhere classified: Secondary | ICD-10-CM | POA: Diagnosis present

## 2011-12-08 LAB — COMPREHENSIVE METABOLIC PANEL
Albumin: 3.3 g/dL — ABNORMAL LOW (ref 3.5–5.2)
Alkaline Phosphatase: 120 U/L — ABNORMAL HIGH (ref 39–117)
BUN: 10 mg/dL (ref 6–23)
Creatinine, Ser: 0.67 mg/dL (ref 0.50–1.10)
GFR calc Af Amer: >90 mL/min (ref >90)
Glucose, Bld: 130 mg/dL — ABNORMAL HIGH (ref 70–99)
Total Protein: 7.8 g/dL (ref 6.0–8.3)

## 2011-12-08 LAB — LIPASE, BLOOD: Lipase: 141 U/L — ABNORMAL HIGH (ref 11–59)

## 2011-12-08 LAB — AMYLASE: Amylase: 84 U/L (ref 0–105)

## 2011-12-08 NOTE — Progress Notes (Signed)
Patient ID: [name], female   DOB: 1960/10/19, 51 y.o.   MRN: [id number] [facility] Progress Note  [name] 51 y.o. [birth year]   Subjective: Feels better. Denies any current abdominal pain. CT negative for pancreatic inflammation.  Objective: Vital signs in last 24 hours: Filed Vitals:   12/08/11 0430  BP: 111/71  Pulse: 92  Temp: 98.9 F (37.2 C)  Resp: 20    Physical Exam: Gen: alert, no acute distress Abd: nontender, soft, nondistended  Lab Results:  Basename 12/08/11 0645 12/07/11 0600 12/06/11 1000  NA 137 136 --  K 4.1 3.8 --  CL 99 98 --  CO2 27 27 --  GLUCOSE 130* 239* --  BUN 10 11 --  CREATININE 0.67 0.64 --  CALCIUM 9.2 9.1 --  MG -- -- 2.2  PHOS -- -- --    Basename 12/08/11 0645 12/07/11 0600  AST 27 27  ALT 27 25  ALKPHOS 120* 112  BILITOT 0.4 0.4  PROT 7.8 7.5  ALBUMIN 3.3* 3.2*    Basename 12/07/11 0600 12/06/11 0402 12/05/11 1958  WBC 5.8 6.2 --  NEUTROABS -- -- 3.1  HGB 12.4 12.4 --  HCT 38.4 38.0 --  MCV 81.9 79.8 --  PLT 300 286 --   No results found for this basename: LABPROT:2,INR:2 in the last 72 hours    Assessment/Plan: 51 yo with mild pancreatitis clinically without any CT evidence of pancreatic inflammation. Lipase improving down to 141. Will give sips of clears and then clear liquid diet only today. Advance tomorrow if ok. Continue supportive care.   Stacey Mcfayden C. 12/08/2011, 8:41 AM

## 2011-12-08 NOTE — Progress Notes (Addendum)
Pt. Seen and examined. Agree with the NP/PA-C note as written.  Main complaint is back and leg pain. She is intolerant to lipitor, can only take Livalo.  Will discontinue this. She is also complaining of her joint and back pain. She has dilaudid prn for pain. Mild pancreatitis, improving with pancreatic rest. Appreciate Dr. Marge Duncans assistance. Advancing to clears.  Chrystie Nose, MD, [facility] Attending Cardiologist [facility]

## 2011-12-08 NOTE — Progress Notes (Signed)
Subjective:  Nausea and mid sternal discomfort better  Objective:  Vital Signs in the last 24 hours: Temp:  [97.2 F (36.2 C)-98.9 F (37.2 C)] 98.2 F (36.8 C) (11/13 1010) Pulse Rate:  [80-97] 94  (11/13 1010) Resp:  [18-20] 18  (11/13 1010) BP: (91-147)/(60-98) 103/72 mmHg (11/13 1010) SpO2:  [93 %-98 %] 93 % (11/13 1010) Weight:  [125 kg (275 lb 9.2 oz)] 125 kg (275 lb 9.2 oz) (11/13 0430)  Intake/Output from previous day:  Intake/Output Summary (Last 24 hours) at 12/08/11 1242 Last data filed at 12/08/11 1155  Gross per 24 hour  Intake    843 ml  Output   2375 ml  Net  -1532 ml    Physical Exam: General appearance: alert, cooperative and no distress Lungs: clear to auscultation bilaterally Heart: regular rate and rhythm Abdomen: obese, positive bowel sounds   Rate: 80  Rhythm: NSR  Lab Results:  Basename 12/07/11 0600 12/06/11 0402  WBC 5.8 6.2  HGB 12.4 12.4  PLT 300 286    Basename 12/08/11 0645 12/07/11 0600  NA 137 136  K 4.1 3.8  CL 99 98  CO2 27 27  GLUCOSE 130* 239*  BUN 10 11  CREATININE 0.67 0.64    Basename 12/06/11 1006 12/06/11 0402  TROPONINI <0.30 <0.30   Hepatic Function Panel  Basename 12/08/11 0645  PROT 7.8  ALBUMIN 3.3*  AST 27  ALT 27  ALKPHOS 120*  BILITOT 0.4  BILIDIR --  IBILI --    Basename 12/06/11 1000  CHOL 142   No results found for this basename: INR in the last 72 hours  Imaging: Imaging results have been reviewed  Cardiac Studies:  Assessment/Plan:   Principal Problem:  *Substernal chest pain, suspected secondary to acute pancreatitis Active Problems:  Hyperlipidemia  Hypertension  Pancreatitis, mild, CT negative  BV (bacterial vaginosis)  Morbid obesity  Hypothyroidism  GERD (gastroesophageal reflux disease)  Anxiety  Fatty liver  Plan- Liquids today, advance diet per GI in am.    Corine Shelter PA-C 12/08/2011, 12:42 PM

## 2011-12-09 DIAGNOSIS — M549 Dorsalgia, unspecified: Secondary | ICD-10-CM | POA: Diagnosis not present

## 2011-12-09 LAB — GLUCOSE, CAPILLARY
Glucose-Capillary: 162 mg/dL — ABNORMAL HIGH (ref 70–99)
Glucose-Capillary: 119 mg/dL — ABNORMAL HIGH (ref 70–99)
Glucose-Capillary: 143 mg/dL — ABNORMAL HIGH (ref 70–99)
Glucose-Capillary: 170 mg/dL — ABNORMAL HIGH (ref 70–99)

## 2011-12-09 LAB — COMPREHENSIVE METABOLIC PANEL
AST: 58 U/L — ABNORMAL HIGH (ref 0–37)
Albumin: 3.1 g/dL — ABNORMAL LOW (ref 3.5–5.2)
BUN: 10 mg/dL (ref 6–23)
Chloride: 92 mEq/L — ABNORMAL LOW (ref 96–112)
Creatinine, Ser: 0.68 mg/dL (ref 0.50–1.10)
Potassium: 4.1 mEq/L (ref 3.5–5.1)
Total Bilirubin: 0.5 mg/dL (ref 0.3–1.2)
Total Protein: 7.8 g/dL (ref 6.0–8.3)

## 2011-12-09 LAB — LIPASE, BLOOD: Lipase: 67 U/L — ABNORMAL HIGH (ref 11–59)

## 2011-12-09 LAB — AMYLASE: Amylase: 62 U/L (ref 0–105)

## 2011-12-09 MED ORDER — KETOROLAC TROMETHAMINE 30 MG/ML IJ SOLN
30.0000 mg | Freq: Four times a day (QID) | INTRAMUSCULAR | Status: AC
  Administered 2011-12-09 – 2011-12-10 (×4): 30 mg via INTRAVENOUS
  Filled 2011-12-09 (×4): qty 1

## 2011-12-09 NOTE — Plan of Care (Signed)
Problem: Discharge Progression Outcomes Goal: No anginal pain Outcome: Completed/Met Date Met:  12/09/11 Pt has not had any c/o anginal pain, or SOB, pt on full liquid diet, no c/o n/v, goal met, will continue to monitor

## 2011-12-09 NOTE — Progress Notes (Signed)
Subjective:  C/O bilat leg pain from her thighs to her knees, worse with leg raises. She has had prior back surgery.  Objective:  Vital Signs in the last 24 hours: Temp:  [97.1 F (36.2 C)-98.4 F (36.9 C)] 97.1 F (36.2 C) (11/14 0506) Pulse Rate:  [86-89] 87  (11/14 0506) Resp:  [18-20] 20  (11/14 0506) BP: (96-120)/(54-70) 108/60 mmHg (11/14 0506) SpO2:  [95 %-100 %] 96 % (11/14 0506) Weight:  [126.055 kg (277 lb 14.4 oz)] 126.055 kg (277 lb 14.4 oz) (11/14 0414)  Intake/Output from previous day:  Intake/Output Summary (Last 24 hours) at 12/09/11 1035 Last data filed at 12/09/11 1017  Gross per 24 hour  Intake   1521 ml  Output   2575 ml  Net  -1054 ml    Physical Exam: General appearance: alert, cooperative and mild distress Lungs: clear to auscultation bilaterally Heart: regular rate and rhythm   Rate: 86  Rhythm: normal sinus rhythm  Lab Results:  Basename 12/07/11 0600  WBC 5.8  HGB 12.4  PLT 300    Basename 12/09/11 0520 12/08/11 0645  NA 131* 137  K 4.1 4.1  CL 92* 99  CO2 30 27  GLUCOSE 175* 130*  BUN 10 10  CREATININE 0.68 0.67   No results found for this basename: TROPONINI:2,CK,MB:2 in the last 72 hours Hepatic Function Panel  Basename 12/09/11 0520  PROT 7.8  ALBUMIN 3.1*  AST 58*  ALT 46*  ALKPHOS 136*  BILITOT 0.5  BILIDIR --  IBILI --   No results found for this basename: CHOL in the last 72 hours No results found for this basename: INR in the last 72 hours  Imaging: Imaging results have been reviewed  Cardiac Studies:  Assessment/Plan:   Principal Problem:  *Substernal chest pain, suspected secondary to acute pancreatitis Active Problems:  Hyperlipidemia  Hypertension  Pancreatitis, mild, CT negative  Back pain, hx of prior back surgery  BV (bacterial vaginosis)  Morbid obesity  Hypothyroidism  GERD (gastroesophageal reflux disease)  Anxiety  Fatty liver   Plan- Diet slowly being advanced. Her main complaint now  is back pain which started after admission. She has had prior back surgery (Dr Henderson Cloud).  Will try IV Tordal and increase prn pain medications. CT scan of her abdomin was unremarkable.    Corine Shelter PA-C 12/09/2011, 10:35 AM    I have seen and examined the patient along with Corine Shelter PA-C.  I have reviewed the chart, notes and new data.  I agree with PA's note.  Pancreatitis symptoms subsiding. Low back and leg pain major complaint. Hopefully to regular diet soon.  Thurmon Fair, MD, [facility] [phone] 12/09/2011, 4:40 PM

## 2011-12-09 NOTE — Progress Notes (Signed)
Patient ID: [name], female   DOB: [birth year], 51 y.o.   MRN: [id number] [facility] Progress Note  [name] 51 y.o. 04-Apr-1960   Subjective: Denies abdominal pain, nausea or vomiting. Complaining of leg pains. Tolerating clears without any abdominal pain.  Objective: Vital signs: Filed Vitals:   12/09/11 0506  BP: 108/60  Pulse: 87  Temp: 97.1 F (36.2 C)  Resp: 20    Physical Exam: Gen: alert, no acute distress  Abd: soft, nontender, nondistended  Lab Results:  Basename 12/09/11 0520 12/08/11 0645 12/06/11 1000  NA 131* 137 --  K 4.1 4.1 --  CL 92* 99 --  CO2 30 27 --  GLUCOSE 175* 130* --  BUN 10 10 --  CREATININE 0.68 0.67 --  CALCIUM 8.9 9.2 --  MG -- -- 2.2  PHOS -- -- --    [facility] 12/09/11 0520 12/08/11 0645  AST 58* 27  ALT 46* 27  ALKPHOS 136* 120*  BILITOT 0.5 0.4  PROT 7.8 7.8  ALBUMIN 3.1* 3.3*    Basename 12/07/11 0600  WBC 5.8  NEUTROABS --  HGB 12.4  HCT 38.4  MCV 81.9  PLT 300      Assessment/Plan: Mild pancreatitis that is resolving: Will advance diet and if tolerates advancement ok to go home tomorrow from GI standpoint.   [name] C. 12/09/2011, 9:12 AM

## 2011-12-09 NOTE — Progress Notes (Signed)
I cosign [name]'s assessment, Med administration, notes, I/O, and care plan/education. 

## 2011-12-10 LAB — LIPASE, BLOOD: Lipase: 98 U/L — ABNORMAL HIGH (ref 11–59)

## 2011-12-10 LAB — COMPREHENSIVE METABOLIC PANEL
Albumin: 3.2 g/dL — ABNORMAL LOW (ref 3.5–5.2)
BUN: 11 mg/dL (ref 6–23)
Calcium: 9.3 mg/dL (ref 8.4–10.5)
Creatinine, Ser: 0.74 mg/dL (ref 0.50–1.10)
GFR calc Af Amer: >90 mL/min (ref >90)
Glucose, Bld: 113 mg/dL — ABNORMAL HIGH (ref 70–99)
Total Protein: 8.3 g/dL (ref 6.0–8.3)

## 2011-12-10 LAB — AMYLASE: Amylase: 66 U/L (ref 0–105)

## 2011-12-10 MED ORDER — POLYETHYLENE GLYCOL 3350 17 G PO PACK: 17.0000 g | PACK | Freq: Two times a day (BID) | ORAL | Status: DC

## 2011-12-10 MED ORDER — ALUM & MAG HYDROXIDE-SIMETH 200-200-20 MG/5ML PO SUSP: 30.0000 mL | Freq: Four times a day (QID) | ORAL | Status: DC | PRN

## 2011-12-10 MED ORDER — ACETAMINOPHEN 325 MG PO TABS: 650.0000 mg | ORAL_TABLET | Freq: Four times a day (QID) | ORAL | Status: DC | PRN

## 2011-12-10 NOTE — Discharge Summary (Signed)
Patient ID: [name],  MRN: [id number], DOB/AGE: 1960/04/06 51 y.o.  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 She has a history of HTN, HLD, arthritis, hypothyroidism, GERD and anxiety. Her last nuc stress test was several years ago. She has not been seen by Dr. Alanda Amass in over 2 years. She was recently started on an antibiotic for vaginitis. She developed a dull CP this past Friday and it has been getting progressively worse. Yesterday it was 8/10 in intensity so she thought she needed evaluation. She reports associated Nausea and the pain is exacerbated with palpation, inspiration and eating. Also has upper abdominal pain. She denies SOB, orthopnea, vomiting, fever, diaphoresis, palpitation, cough, congestion, dysuria, hematuria. Her last BM was yesterday. No hematochezia or melena. She was admitted and ruled out for an MI. Her lipase was elevated. She was scene in consult by Dr Bosie Clos and it was ultimately felt that she had mild pancreatitis. Her abdominal CT showed no acute changes. She improved with NPO for 24hrs followed by clear liquids. She did develop bilat upper leg pain which we believe is secondary to some chronic back issues. This improved with Toradol. She will follow up with Dr Alanda Amass and Dr Bosie Clos prn.   Discharge Vitals:  Blood pressure 106/70, pulse 90, temperature 97.7 F (36.5 C), temperature source Oral, resp. rate 19, height 5\' 4"  (1.626 m), weight  125.5 kg (276 lb 10.8 oz), SpO2 95.00%.    Labs: Results for orders placed during the hospital encounter of 12/05/11 (from the past 48 hour(s))  GLUCOSE, CAPILLARY     Status: Abnormal   Collection Time   12/08/11  9:18 PM      Component Value Range Comment   Glucose-Capillary 157 (*) 70 - 99 mg/dL   COMPREHENSIVE METABOLIC PANEL     Status: Abnormal   Collection Time   12/09/11  5:20 AM      Component Value Range Comment   Sodium 131 (*) 135 - 145 mEq/L    Potassium 4.1  3.5 - 5.1 mEq/L    Chloride 92 (*) 96 - 112 mEq/L    CO2 30  19 - 32 mEq/L    Glucose, Bld 175 (*) 70 - 99 mg/dL    BUN 10  6 - 23 mg/dL    Creatinine, Ser 4.09  0.50 - 1.10 mg/dL    Calcium 8.9  8.4 - 81.1 mg/dL    Total Protein 7.8  6.0 - 8.3 g/dL    Albumin 3.1 (*) 3.5 - 5.2 g/dL    AST 58 (*) 0 - 37 U/L    ALT 46 (*) 0 - 35 U/L    Alkaline Phosphatase 136 (*) 39 - 117 U/L    Total Bilirubin 0.5  0.3 - 1.2 mg/dL    GFR calc non Af Amer >90  >90 mL/min    GFR calc Af Amer >90  >90 mL/min   AMYLASE     Status: Normal   Collection Time   12/09/11  5:20 AM  Component Value Range Comment   Amylase 62  0 - 105 U/L   LIPASE, BLOOD     Status: Abnormal   Collection Time   12/09/11  5:20 AM      Component Value Range Comment   Lipase 67 (*) 11 - 59 U/L   GLUCOSE, CAPILLARY     Status: Abnormal   Collection Time   12/09/11  6:23 AM      Component Value Range Comment   Glucose-Capillary 162 (*) 70 - 99 mg/dL    Comment 1 Notify RN     GLUCOSE, CAPILLARY     Status: Abnormal   Collection Time   12/09/11 11:22 AM      Component Value Range Comment   Glucose-Capillary 119 (*) 70 - 99 mg/dL    Comment 1 Notify RN     GLUCOSE, CAPILLARY     Status: Abnormal   Collection Time   12/09/11  4:12 PM      Component Value Range Comment   Glucose-Capillary 143 (*) 70 - 99 mg/dL    Comment 1 Notify RN     GLUCOSE, CAPILLARY     Status: Abnormal   Collection Time   12/09/11  8:50 PM      Component Value  Range Comment   Glucose-Capillary 170 (*) 70 - 99 mg/dL   COMPREHENSIVE METABOLIC PANEL     Status: Abnormal   Collection Time   12/10/11  5:15 AM      Component Value Range Comment   Sodium 134 (*) 135 - 145 mEq/L    Potassium 4.0  3.5 - 5.1 mEq/L    Chloride 95 (*) 96 - 112 mEq/L    CO2 29  19 - 32 mEq/L    Glucose, Bld 113 (*) 70 - 99 mg/dL    BUN 11  6 - 23 mg/dL    Creatinine, Ser 1.61  0.50 - 1.10 mg/dL    Calcium 9.3  8.4 - 09.6 mg/dL    Total Protein 8.3  6.0 - 8.3 g/dL    Albumin 3.2 (*) 3.5 - 5.2 g/dL    AST 24  0 - 37 U/L    ALT 34  0 - 35 U/L    Alkaline Phosphatase 133 (*) 39 - 117 U/L    Total Bilirubin 0.5  0.3 - 1.2 mg/dL    GFR calc non Af Amer >90  >90 mL/min    GFR calc Af Amer >90  >90 mL/min   AMYLASE     Status: Normal   Collection Time   12/10/11  5:15 AM      Component Value Range Comment   Amylase 66  0 - 105 U/L   LIPASE, BLOOD     Status: Abnormal   Collection Time   12/10/11  5:15 AM      Component Value Range Comment   Lipase 98 (*) 11 - 59 U/L   GLUCOSE, CAPILLARY     Status: Abnormal   Collection Time   12/10/11 11:34 AM      Component Value Range Comment   Glucose-Capillary 124 (*) 70 - 99 mg/dL    Comment 1 Notify RN       Disposition:  Follow-up Information    Follow up with Shirley Friar., MD. (Call the office if needed)    Contact information:   [address] [address] Jaynie Crumble [place] [address] [phone]  Discharge Medications:    Medication List     As of 12/10/2011 12:15 PM    STOP taking these medications         ALPRAZolam 0.25 MG tablet   Commonly known as: XANAX      metroNIDAZOLE 500 MG tablet   Commonly known as: FLAGYL      TAKE these medications         acetaminophen 325 MG tablet   Commonly known as: TYLENOL   Take 2 tablets (650 mg total) by mouth every 6 (six) hours as needed (or Fever >/= 101).      alum & mag hydroxide-simeth 200-200-20  MG/5ML suspension   Commonly known as: MAALOX/MYLANTA   Take 30 mLs by mouth every 6 (six) hours as needed (dyspepsia).      aspirin EC 81 MG tablet   Take 81 mg by mouth daily.      diazepam 5 MG tablet   Commonly known as: VALIUM   Take 5 mg by mouth daily.      esomeprazole 40 MG capsule   Commonly known as: NEXIUM   Take 40 mg by mouth daily before breakfast.      gabapentin 300 MG capsule   Commonly known as: NEURONTIN   Take 300 mg by mouth 3 (three) times daily.      GAS-X PO   Take 2 tablets by mouth daily as needed. For gas      HYDROcodone-acetaminophen 10-325 MG per tablet   Commonly known as: NORCO   Take 1 tablet by mouth every 6 (six) hours as needed. For pain      levothyroxine 50 MCG tablet   Commonly known as: SYNTHROID, LEVOTHROID   Take 50 mcg by mouth daily.      metoprolol tartrate 25 MG tablet   Commonly known as: LOPRESSOR   Take 25 mg by mouth 2 (two) times daily.      nabumetone 750 MG tablet   Commonly known as: RELAFEN   Take 1,500 mg by mouth 2 (two) times daily.      omega-3 acid ethyl esters 1 G capsule   Commonly known as: LOVAZA   Take 1 g by mouth daily.      Pitavastatin Calcium 1 MG Tabs   Take 1 mg by mouth daily.      polyethylene glycol packet   Commonly known as: MIRALAX / GLYCOLAX   Take 17 g by mouth 2 (two) times daily.      SYSTANE OP   Place 1 drop into both eyes daily as needed. For dry eyes      valsartan-hydrochlorothiazide 160-12.5 MG per tablet   Commonly known as: DIOVAN-HCT   Take 1 tablet by mouth daily.          Duration of Discharge Encounter: Greater than 30 minutes including physician time.  Jolene Provost PA-C 12/10/2011 12:15 PM

## 2011-12-10 NOTE — Progress Notes (Signed)
Pt given d/c instructions, follow ups, and medications, pt verbalized understanding, pt leaving via wheelchair going home with husband, pt stable

## 2011-12-10 NOTE — Progress Notes (Signed)
Subjective:  Leg pain improving, tolerating diet  Objective:  Vital Signs in the last 24 hours: Temp:  [97.5 F (36.4 C)-98.2 F (36.8 C)] 97.7 F (36.5 C) (11/15 0526) Pulse Rate:  [86-104] 90  (11/15 0526) Resp:  [19-20] 19  (11/15 0526) BP: (106-129)/(68-75) 106/70 mmHg (11/15 0526) SpO2:  [95 %-100 %] 95 % (11/15 0526) Weight:  [125.5 kg (276 lb 10.8 oz)] 125.5 kg (276 lb 10.8 oz) (11/15 0526)  Intake/Output from previous day:  Intake/Output Summary (Last 24 hours) at 12/10/11 0854 Last data filed at 12/10/11 0850  Gross per 24 hour  Intake   1182 ml  Output   1975 ml  Net   -793 ml    Physical Exam: General appearance: alert, cooperative, no distress and morbidly obese Lungs: clear to auscultation bilaterally Heart: regular rate and rhythm Abdomen: soft, non-tender; bowel sounds normal; no masses,  no organomegaly   Rate: 88  Rhythm: normal sinus rhythm  Lab Results: No results found for this basename: WBC:2,HGB:2,PLT:2 in the last 72 hours  Basename 12/10/11 0515 12/09/11 0520  NA 134* 131*  K 4.0 4.1  CL 95* 92*  CO2 29 30  GLUCOSE 113* 175*  BUN 11 10  CREATININE 0.74 0.68   No results found for this basename: TROPONINI:2,CK,MB:2 in the last 72 hours Hepatic Function Panel  Basename 12/10/11 0515  PROT 8.3  ALBUMIN 3.2*  AST 24  ALT 34  ALKPHOS 133*  BILITOT 0.5  BILIDIR --  IBILI --   No results found for this basename: CHOL in the last 72 hours No results found for this basename: INR in the last 72 hours  Imaging: Imaging results have been reviewed  Cardiac Studies:  Assessment/Plan:   Principal Problem:  *Substernal chest pain, suspected secondary to acute pancreatitis Active Problems:  Hyperlipidemia  Hypertension  Pancreatitis, mild, CT negative  Back pain, hx of prior back surgery  BV (bacterial vaginosis)  Morbid obesity  Hypothyroidism  GERD (gastroesophageal reflux disease)  Anxiety  Fatty liver   Plan- Discharge  today. GI f/u prn. Follow up with dietitian in our office after discharge.   Corine Shelter PA-C 12/10/2011, 8:54 AM   Leg pain improving, tolerating diet I have seen and examined the patient along with Corine Shelter PA-C.  I have reviewed the chart, notes and new data.  I agree with PA/NP's note. DC home.  Thurmon Fair, MD, [facility] [phone] 12/10/2011, 10:16 AM

## 2011-12-10 NOTE — Plan of Care (Signed)
Problem: Phase III Progression Outcomes Goal: Discharge plan remains appropriate-arrangements made Outcome: Completed/Met Date Met:  12/10/11 Pt will be d/c to home with husband  Problem: Discharge Progression Outcomes Goal: Vascular site scale level 0 - I Vascular Site Scale Level 0: No bruising/bleeding/hematoma Level I (Mild): Bruising/Ecchymosis, minimal bleeding/ooozing, palpable hematoma < 3 cm Level II (Moderate): Bleeding not affecting hemodynamic parameters, pseudoaneurysm, palpable hematoma > 3 cm  Outcome: Not Applicable Date Met:  12/10/11 Pt did not have cath goal NA Goal: Activity appropriate for discharge plan Outcome: Completed/Met Date Met:  12/10/11 Pt oob ad lib, pt has c/o pain in knees, pt on NSAIDs for pain, goal met

## 2011-12-10 NOTE — Progress Notes (Signed)
I cosign [name]'s assessment, Med administration, notes, I/O, and care plan/education. 

## 2012-02-14 ENCOUNTER — Other Ambulatory Visit: Payer: Self-pay | Admitting: Family Medicine

## 2012-02-14 DIAGNOSIS — Z1231 Encounter for screening mammogram for malignant neoplasm of breast: Secondary | ICD-10-CM

## 2012-06-28 ENCOUNTER — Encounter: Payer: Self-pay | Admitting: *Deleted

## 2012-06-28 ENCOUNTER — Encounter: Payer: 59 | Attending: Family Medicine | Admitting: *Deleted

## 2012-06-28 DIAGNOSIS — E119 Type 2 diabetes mellitus without complications: Secondary | ICD-10-CM | POA: Insufficient documentation

## 2012-06-28 DIAGNOSIS — Z713 Dietary counseling and surveillance: Secondary | ICD-10-CM | POA: Insufficient documentation

## 2012-06-28 NOTE — Progress Notes (Signed)
Medical Nutrition Therapy:  Appt start time: 1645 end time:  1745.  Assessment:  Primary concern today: Weight management. Patient reports that she was recently diagnosed with type 2 diabetes 2-3 weeks ago (not on medications, not checking BG). Since then, she has changed her diet habits to manage BG and lose weight. She has lost about 8 pounds in the last 3 weeks. She has decreased intake of sweets, desserts, and sugary drinks, reduced portion sizes, and made an effort to reduce carb intake. She has also started exercising regularly. She states that sheis on her feet a lot at work also.   MEDICATIONS: metoprolol, protonix, fish oil, levothyroxine, gabapentin   DIETARY INTAKE:   Usual eating pattern includes 3 meals and 1-2 snacks per day.  24-hr recall:  B ( AM): Peanut butter crackers, sugar free coolata  Snk ( AM): Sometime, Weight Watchers cheese, apple yogurt  L ( PM): Gets lunch from drug reps, baked chicken mac and cheese, green beans, pizza, salad, baked potato bar, water with diet kool-aid/crystal light packet, diet soda Snk ( PM): Sometimes, same, yogurt with granola D ( PM): Chicken/fish (grilled mostly, fried1-2 days weekly), green beans/peas, baked potato/baked beans OR chicken and dumplings OR fettucini Snk ( PM): Weight Watchers ice cream bar Beverages: water, diet soda, Crystal light  Usual physical activity: Walking on treadmill 15-20 minutes, 3-4 days weekly, strength exercise with resistance band  Estimated energy needs: 1800 calories 225 g carbohydrates 113 g protein 50 g fat  Progress Towards Goal(s):  In progress.   Nutritional Diagnosis:  [place]-3.3 Overweight/obesity As related to history of excessive energy intake and physical inactivity.  As evidenced by BMI 48.    Intervention:  Nutrition counseling. We discussed strategies for weight loss, including balancing nutrients (carbs, protein, fat), portion control, healthy snacks, and exercise. We also briefly discussed  basic carb counting, including foods with carbs, portion size, and meal planning.   Goals:  1. 1 pound weight loss per week. Goal weight 255 pounds in 6 months.  2. 3 carbohydrate servings at meals, 1 serving at snacks.  3. Monitor portion size.  4. Continue to limit sweets/sugary drinks.  5. Work up to walking on treadmill 30 minutes 4 days weekly.   Handouts given during visit include:  Count your carbs booklet  Weight loss tips  Yellow meal plan card  Monitoring/Evaluation:  Dietary intake, exercise, blood glucose, and body weight in 1 month(s).

## 2012-08-01 ENCOUNTER — Ambulatory Visit: Payer: BC Managed Care – PPO | Admitting: *Deleted

## 2012-08-14 ENCOUNTER — Ambulatory Visit: Payer: BC Managed Care – PPO | Admitting: *Deleted

## 2012-08-21 ENCOUNTER — Encounter: Payer: 59 | Attending: Family Medicine | Admitting: *Deleted

## 2012-08-21 DIAGNOSIS — Z713 Dietary counseling and surveillance: Secondary | ICD-10-CM | POA: Insufficient documentation

## 2012-08-21 DIAGNOSIS — E119 Type 2 diabetes mellitus without complications: Secondary | ICD-10-CM | POA: Insufficient documentation

## 2012-08-21 NOTE — Patient Instructions (Signed)
Plan:  Aim for 3 Carb Choices per meal (45 grams) +/- 1 either way  Aim for 0-1 Carbs per snack if hungry  Consider limiting Coolatta beverage to twice a week and ask for a nutrition menu so you can see what other options to look for. Consider reading food labels for Total Carbohydrate of foods Consider  increasing your activity level by walking at work for 30 minutes daily as tolerated

## 2012-08-21 NOTE — Progress Notes (Signed)
Medical Nutrition Therapy:  Appt start time: 1600 end time:  1630.  Assessment:  Primary concern today: Weight management follow up visit with recent diagnosis of diabetes. Continues to do well with limiting sweets and other high calorie foods. Continues to be motivated to lose weight with better food choices and regular exercise.  MEDICATIONS: see list   DIETARY INTAKE:   Usual eating pattern includes 3 meals and 1-2 snacks per day.  24-hr recall:  B ( AM): Peanut butter crackers, sugar free coolata OR sausage and egg wrap  Snk ( AM): Sometime, Weight Watchers cheese, apple yogurt  L ( PM): Gets lunch from drug reps, baked chicken mac and cheese, green beans, pizza, salad, baked potato bar, water with diet kool-aid/crystal light packet, diet soda Snk ( PM): Sometimes, same, yogurt with granola D ( PM): Chicken/fish (grilled mostly, fried1-2 days weekly), green beans/peas, baked potato/baked beans OR chicken and dumplings OR fettucini Snk ( PM): Weight Watchers ice cream bar Beverages: water, diet soda, Crystal light  Usual physical activity: Walking on treadmill 15-20 minutes, 3-4 days weekly, strength exercise with resistance band  Estimated energy needs: 1800 calories 225 g carbohydrates 113 g protein 50 g fat  Progress Towards Goal(s):  In progress.   Nutritional Diagnosis:  [place]-3.3 Overweight/obesity As related to history of excessive energy intake and physical inactivity.  As evidenced by BMI 48.    Intervention:  We discussed strategies for weight loss, including balancing nutrients (carbs, protein, fat), portion control, healthy snacks, and exercise. We also briefly discussed basic carb counting, including foods with carbs, portion size, and meal planning.   Plan:  Aim for 3 Carb Choices per meal (45 grams) +/- 1 either way  Aim for 0-1 Carbs per snack if hungry  Consider limiting Coolatta beverage to twice a week and ask for a nutrition menu so you can see what other  options to look for. Consider reading food labels for Total Carbohydrate of foods Consider  increasing your activity level by walking at work for 30 minutes daily as tolerated   Handouts given during visit include: no new handouts at this visit  Monitoring/Evaluation:  Dietary intake, exercise, blood glucose, and body weight in 3 month(s).

## 2012-09-11 ENCOUNTER — Ambulatory Visit: Payer: 59

## 2012-10-02 ENCOUNTER — Ambulatory Visit
Admission: RE | Admit: 2012-10-02 | Discharge: 2012-10-02 | Disposition: A | Discharge status: Home / Self Care | Payer: 59 | Source: Ambulatory Visit | Attending: Family Medicine | Admitting: Family Medicine

## 2012-10-02 DIAGNOSIS — Z1231 Encounter for screening mammogram for malignant neoplasm of breast: Secondary | ICD-10-CM

## 2012-10-04 ENCOUNTER — Other Ambulatory Visit: Payer: Self-pay | Admitting: Family Medicine

## 2012-10-04 DIAGNOSIS — R928 Other abnormal and inconclusive findings on diagnostic imaging of breast: Secondary | ICD-10-CM

## 2012-10-19 ENCOUNTER — Encounter: Payer: Self-pay | Admitting: Women's Health

## 2012-10-19 ENCOUNTER — Ambulatory Visit (INDEPENDENT_AMBULATORY_CARE_PROVIDER_SITE_OTHER): Payer: 59 | Admitting: Women's Health

## 2012-10-19 VITALS — BP 148/88 | Ht 64.0 in | Wt 279.0 lb

## 2012-10-19 DIAGNOSIS — A499 Bacterial infection, unspecified: Secondary | ICD-10-CM

## 2012-10-19 DIAGNOSIS — L940 Localized scleroderma [morphea]: Secondary | ICD-10-CM

## 2012-10-19 DIAGNOSIS — N760 Acute vaginitis: Secondary | ICD-10-CM

## 2012-10-19 DIAGNOSIS — N949 Unspecified condition associated with female genital organs and menstrual cycle: Secondary | ICD-10-CM

## 2012-10-19 DIAGNOSIS — R102 Pelvic and perineal pain: Secondary | ICD-10-CM

## 2012-10-19 DIAGNOSIS — L900 Lichen sclerosus et atrophicus: Secondary | ICD-10-CM

## 2012-10-19 DIAGNOSIS — B9689 Other specified bacterial agents as the cause of diseases classified elsewhere: Secondary | ICD-10-CM

## 2012-10-19 LAB — WET PREP FOR TRICH, YEAST, CLUE
WBC, Wet Prep HPF POC: NONE SEEN
Yeast Wet Prep HPF POC: NONE SEEN

## 2012-10-19 MED ORDER — CLOBETASOL PROPIONATE 0.05 % EX CREA: TOPICAL_CREAM | Freq: Two times a day (BID) | CUTANEOUS | Status: DC

## 2012-10-19 MED ORDER — CLINDAMYCIN PHOSPHATE 2 % VA CREA: TOPICAL_CREAM | VAGINAL | Status: DC

## 2012-10-19 NOTE — Progress Notes (Signed)
Patient ID: [name], female   DOB: Jan 09, 1961, 52 y.o.   MRN: [id number] Presents with several complaints. Having vaginal discomfort with itching, irritation mostly external. Intermittent low abdominal pain. Denies dyspareunia, minimal discharge, no urinary symptoms. Had a negative urine with culture last week at primary care. History of lichen sclerosus with some relief with Temovate. Vaginal hysterectomy for prolapse many years ago. Reports treated for bacteria. vaginosis with Flagyl that caused pancreatitis. Hypertension, hypothyroid, GERD, hyperlipidemia primary care manages labs and meds.  Exam: Appears well, external genitalia classic white patches  labia, perineum, speculum exam scant white discharge with minimal erythema noted. Wet prep positive for amines, clues, and TNTC bacteria.  Bacteria vaginosis Lichen sclerosus Morbid obesity  Plan: Temovate .05 prescription, proper use given and reviewed has used in the past without problem. Reviewed to use a small amount twice daily until symptoms resolve. Cleocin vaginal cream 1 applicator at bedtime for 5 nights prescription, proper use given and reviewed. Instructed to call if symptoms persist.

## 2012-10-19 NOTE — Patient Instructions (Addendum)
Lichen Sclerosus  Lichen sclerosus is a skin problem. It can happen on any part of the body, but it commonly involves the anal or genital areas. Lichen sclerosus is not an infection or a fungus. Girls and women are more commonly affected than boys and men.  CAUSES  The cause is not known. It could be the result of an overactive immune system or a lack of certain hormones. Lichen sclerosus is not passed from one person to another (not contagious).  SYMPTOMS  Your skin may have:  · Thin, wrinkled, white areas.  · Thickened white areas.  · Red and swollen patches.  · Tears or cracks.  · Bruising.  · Blood blisters.  · Severe itching.  You may also have pain, itching, or burning with urination. Constipation is also common in people with lichen sclerosus.  DIAGNOSIS  Your caregiver will do a physical exam. Sometimes, a tissue sample (biopsy) may be sent for testing.  TREATMENT  Treatment may involve putting a thin layer of medicated cream (topical steroid) over the areas with lichen sclerosus. Use the cream only as directed by your caregiver.   HOME CARE INSTRUCTIONS  · Only take over-the-counter or prescription medicines as directed by your caregiver.  · Keep the vaginal area as clean and dry as possible.  SEEK MEDICAL CARE IF:  You develop increasing pain, swelling, or redness.  Document Released: 06/03/2010 Document Revised: 04/05/2011 Document Reviewed: 06/03/2010  [employer]® Patient Information ©2014 [employer].

## 2012-10-24 ENCOUNTER — Ambulatory Visit
Admission: RE | Admit: 2012-10-24 | Discharge: 2012-10-24 | Disposition: A | Discharge status: Home / Self Care | Payer: 59 | Source: Ambulatory Visit | Attending: Family Medicine | Admitting: Family Medicine

## 2012-10-24 DIAGNOSIS — R928 Other abnormal and inconclusive findings on diagnostic imaging of breast: Secondary | ICD-10-CM

## 2012-11-21 ENCOUNTER — Ambulatory Visit: Payer: 59 | Admitting: *Deleted

## 2012-11-30 ENCOUNTER — Other Ambulatory Visit: Payer: Self-pay

## 2013-01-10 ENCOUNTER — Telehealth: Payer: Self-pay | Admitting: Cardiovascular Disease

## 2013-01-10 MED ORDER — PITAVASTATIN CALCIUM 2 MG PO TABS: 1.0000 mg | ORAL_TABLET | Freq: Every day | ORAL | Status: DC

## 2013-01-10 NOTE — Telephone Encounter (Signed)
Samples given to pt.  Pt stated she will schedule appt in January to see Dr. Royann Shivers, who will be her new cardiologist.

## 2013-01-10 NOTE — Telephone Encounter (Signed)
She need samples of Livalo 2mg  please.

## 2013-02-20 DIAGNOSIS — R7309 Other abnormal glucose: Secondary | ICD-10-CM | POA: Insufficient documentation

## 2013-02-20 DIAGNOSIS — L659 Nonscarring hair loss, unspecified: Secondary | ICD-10-CM | POA: Insufficient documentation

## 2013-02-20 DIAGNOSIS — E1169 Type 2 diabetes mellitus with other specified complication: Secondary | ICD-10-CM | POA: Insufficient documentation

## 2013-02-20 DIAGNOSIS — M51369 Other intervertebral disc degeneration, lumbar region without mention of lumbar back pain or lower extremity pain: Secondary | ICD-10-CM | POA: Insufficient documentation

## 2013-02-20 DIAGNOSIS — M5136 Other intervertebral disc degeneration, lumbar region: Secondary | ICD-10-CM | POA: Insufficient documentation

## 2013-02-20 DIAGNOSIS — E1165 Type 2 diabetes mellitus with hyperglycemia: Secondary | ICD-10-CM | POA: Insufficient documentation

## 2013-03-25 ENCOUNTER — Emergency Department (HOSPITAL_COMMUNITY)
Admission: EM | Admit: 2013-03-25 | Discharge: 2013-03-25 | Disposition: A | Discharge status: Home / Self Care | Payer: 59 | Attending: Emergency Medicine | Admitting: Emergency Medicine

## 2013-03-25 ENCOUNTER — Emergency Department (HOSPITAL_COMMUNITY): Payer: 59

## 2013-03-25 ENCOUNTER — Encounter (HOSPITAL_COMMUNITY): Payer: Self-pay | Admitting: Emergency Medicine

## 2013-03-25 DIAGNOSIS — Z791 Long term (current) use of non-steroidal anti-inflammatories (NSAID): Secondary | ICD-10-CM | POA: Insufficient documentation

## 2013-03-25 DIAGNOSIS — S99919A Unspecified injury of unspecified ankle, initial encounter: Principal | ICD-10-CM

## 2013-03-25 DIAGNOSIS — Z880 Allergy status to penicillin: Secondary | ICD-10-CM | POA: Insufficient documentation

## 2013-03-25 DIAGNOSIS — Z87891 Personal history of nicotine dependence: Secondary | ICD-10-CM | POA: Insufficient documentation

## 2013-03-25 DIAGNOSIS — W010XXA Fall on same level from slipping, tripping and stumbling without subsequent striking against object, initial encounter: Secondary | ICD-10-CM | POA: Insufficient documentation

## 2013-03-25 DIAGNOSIS — IMO0002 Reserved for concepts with insufficient information to code with codable children: Secondary | ICD-10-CM | POA: Insufficient documentation

## 2013-03-25 DIAGNOSIS — Z7982 Long term (current) use of aspirin: Secondary | ICD-10-CM | POA: Insufficient documentation

## 2013-03-25 DIAGNOSIS — F411 Generalized anxiety disorder: Secondary | ICD-10-CM | POA: Insufficient documentation

## 2013-03-25 DIAGNOSIS — K219 Gastro-esophageal reflux disease without esophagitis: Secondary | ICD-10-CM | POA: Insufficient documentation

## 2013-03-25 DIAGNOSIS — M129 Arthropathy, unspecified: Secondary | ICD-10-CM | POA: Insufficient documentation

## 2013-03-25 DIAGNOSIS — W19XXXA Unspecified fall, initial encounter: Secondary | ICD-10-CM

## 2013-03-25 DIAGNOSIS — S99929A Unspecified injury of unspecified foot, initial encounter: Principal | ICD-10-CM

## 2013-03-25 DIAGNOSIS — Z23 Encounter for immunization: Secondary | ICD-10-CM | POA: Insufficient documentation

## 2013-03-25 DIAGNOSIS — Z8742 Personal history of other diseases of the female genital tract: Secondary | ICD-10-CM | POA: Insufficient documentation

## 2013-03-25 DIAGNOSIS — Z792 Long term (current) use of antibiotics: Secondary | ICD-10-CM | POA: Insufficient documentation

## 2013-03-25 DIAGNOSIS — Y929 Unspecified place or not applicable: Secondary | ICD-10-CM | POA: Insufficient documentation

## 2013-03-25 DIAGNOSIS — M25562 Pain in left knee: Secondary | ICD-10-CM

## 2013-03-25 DIAGNOSIS — I10 Essential (primary) hypertension: Secondary | ICD-10-CM | POA: Insufficient documentation

## 2013-03-25 DIAGNOSIS — Z9889 Other specified postprocedural states: Secondary | ICD-10-CM | POA: Insufficient documentation

## 2013-03-25 DIAGNOSIS — E119 Type 2 diabetes mellitus without complications: Secondary | ICD-10-CM | POA: Insufficient documentation

## 2013-03-25 DIAGNOSIS — E039 Hypothyroidism, unspecified: Secondary | ICD-10-CM | POA: Insufficient documentation

## 2013-03-25 DIAGNOSIS — Y939 Activity, unspecified: Secondary | ICD-10-CM | POA: Insufficient documentation

## 2013-03-25 DIAGNOSIS — M533 Sacrococcygeal disorders, not elsewhere classified: Secondary | ICD-10-CM

## 2013-03-25 DIAGNOSIS — E785 Hyperlipidemia, unspecified: Secondary | ICD-10-CM | POA: Insufficient documentation

## 2013-03-25 DIAGNOSIS — Z79899 Other long term (current) drug therapy: Secondary | ICD-10-CM | POA: Insufficient documentation

## 2013-03-25 DIAGNOSIS — S8990XA Unspecified injury of unspecified lower leg, initial encounter: Secondary | ICD-10-CM | POA: Insufficient documentation

## 2013-03-25 MED ORDER — HYDROCODONE-ACETAMINOPHEN 5-325 MG PO TABS: 1.0000 | ORAL_TABLET | ORAL | Status: DC | PRN

## 2013-03-25 MED ORDER — TETANUS-DIPHTH-ACELL PERTUSSIS 5-2.5-18.5 LF-MCG/0.5 IM SUSP
0.5000 mL | Freq: Once | INTRAMUSCULAR | Status: AC
  Administered 2013-03-25: 0.5 mL via INTRAMUSCULAR
  Filled 2013-03-25: qty 0.5

## 2013-03-25 NOTE — ED Notes (Signed)
Pt from home c/o L knee pain and sacral pain after falling on ice this am. Pt denies hitting head or LOC, but has mild abrasion to R elbow. Pt states that she had back surgery approx 3 yrs ago foe spinal stenosis where 5 screws were placed. Pt is A&O and in NAD

## 2013-03-25 NOTE — Discharge Instructions (Signed)
Take Vicodin for severe pain - Please be careful with this medication.  It can cause drowsiness.  Use caution while driving, operating machinery, drinking alcohol, or any other activities that may impair your physical or mental abilities.   Continue RICE method - Use Tylenol/Ibuprofen for moderate pain (or at work) - see below  Return to the emergency department if you develop any changing/worsening condition, weakness, loss of sensation, numbness/tingling, loss of bowel/bladder function, or any other concerns (please read additional information regarding your condition below)      Tailbone Injury The tailbone (coccyx) is the small bone at the lower end of the spine. A tailbone injury may involve stretched ligaments, bruising, or a broken bone (fracture). Women are more vulnerable to this injury due to having a wider pelvis. CAUSES  This type of injury typically occurs from falling and landing on the tailbone. Repeated strain or friction from actions such as rowing and bicycling may also injure the area. The tailbone can be injured during childbirth. Infections or tumors may also press on the tailbone and cause pain. Sometimes, the cause of injury is unknown. SYMPTOMS   Bruising.  Pain when sitting.  Painful bowel movements.  In women, pain during intercourse. DIAGNOSIS  Your caregiver can diagnose a tailbone injury based on your symptoms and a physical exam. X-rays may be taken if a fracture is suspected. Your caregiver may also use an MRI scan imaging test to evaluate your symptoms. TREATMENT  Your caregiver may prescribe medicines to help relieve your pain. Most tailbone injuries heal on their own in 4 to 6 weeks. However, if the injury is caused by an infection or tumor, the recovery period may vary. PREVENTION  Wear appropriate padding and sports gear when bicycling and rowing. This can help prevent an injury from repeated strain or friction. HOME CARE INSTRUCTIONS   Put ice on the  injured area.  Put ice in a plastic bag.  Place a towel between your skin and the bag.  Leave the ice on for 15-20 minutes, every hour while awake for the first 1 to 2 days.  Sit on a large, rubber or inflated ring or cushion to ease your pain. Lean forward when sitting to help decrease discomfort.  Avoid sitting for long periods of time.  Increase your activity as the pain allows.  Only take over-the-counter or prescription medicines for pain, discomfort, or fever as directed by your caregiver.  You may use stool softeners if it is painful to have a bowel movement, or as directed by your caregiver.  Eat a diet with plenty of fiber to help prevent constipation.  Keep all follow-up appointments as directed by your caregiver. SEEK MEDICAL CARE IF:   Your pain becomes worse.  Your bowel movements cause a great deal of discomfort.  You are unable to have a bowel movement.  You have a fever. MAKE SURE YOU:  Understand these instructions.  Will watch your condition.  Will get help right away if you are not doing well or get worse. Document Released: 01/09/2000 Document Revised: 04/05/2011 Document Reviewed: 08/06/2010 [facility] Patient Information 2014 [place], [place].  Knee Pain Knee pain can be a result of an injury or other medical conditions. Treatment will depend on the cause of your pain. HOME CARE  Only take medicine as told by your doctor.  Keep a healthy weight. Being overweight can make the knee hurt more.  Stretch before exercising or playing sports.  If there is constant knee pain, change  the way you exercise. Ask your doctor for advice.  Make sure shoes fit well. Choose the right shoe for the sport or activity.  Protect your knees. Wear kneepads if needed.  Rest when you are tired. GET HELP RIGHT AWAY IF:   Your knee pain does not stop.  Your knee pain does not get better.  Your knee joint feels hot to the touch.  You have a fever. MAKE SURE  YOU:   Understand these instructions.  Will watch this condition.  Will get help right away if you are not doing well or get worse. Document Released: 04/09/2008 Document Revised: 04/05/2011 Document Reviewed: 04/09/2008 [facility] Patient Information 2014 [place], [place].  RICE: Routine Care for Injuries The routine care of many injuries includes Rest, Ice, Compression, and Elevation (RICE). HOME CARE INSTRUCTIONS  Rest is needed to allow your body to heal. Routine activities can usually be resumed when comfortable. Injured tendons and bones can take up to 6 weeks to heal. Tendons are the cord-like structures that attach muscle to bone.  Ice following an injury helps keep the swelling down and reduces pain.  Put ice in a plastic bag.  Place a towel between your skin and the bag.  Leave the ice on for 15-20 minutes, 03-04 times a day. Do this while awake, for the first 24 to 48 hours. After that, continue as directed by your caregiver.  Compression helps keep swelling down. It also gives support and helps with discomfort. If an elastic bandage has been applied, it should be removed and reapplied every 3 to 4 hours. It should not be applied tightly, but firmly enough to keep swelling down. Watch fingers or toes for swelling, bluish discoloration, coldness, numbness, or excessive pain. If any of these problems occur, remove the bandage and reapply loosely. Contact your caregiver if these problems continue.  Elevation helps reduce swelling and decreases pain. With extremities, such as the arms, hands, legs, and feet, the injured area should be placed near or above the level of the heart, if possible. SEEK IMMEDIATE MEDICAL CARE IF:  You have persistent pain and swelling.  You develop redness, numbness, or unexpected weakness.  Your symptoms are getting worse rather than improving after several days. These symptoms may indicate that further evaluation or further X-rays are needed.  Sometimes, X-rays may not show a small broken bone (fracture) until 1 week or 10 days later. Make a follow-up appointment with your caregiver. Ask when your X-ray results will be ready. Make sure you get your X-ray results. Document Released: 04/25/2000 Document Revised: 04/05/2011 Document Reviewed: 06/12/2010 [facility] Patient Information 2014 [place], [place].

## 2013-03-25 NOTE — ED Provider Notes (Signed)
CSN: [id number]     Arrival date & time 03/25/13  1147 History  This chart was scribed for non-physician practitioner Mercy Moore, PA-C, working with Hoy Morn, MD, by Neta Ehlers, ED Scribe. This patient was seen in room WTR6/WTR6 and the patient's care was started at 1:11 PM.   First MD Initiated Contact with Patient 03/25/13 1209     Chief Complaint  Patient presents with  . Knee Pain  . Tailbone Pain   The history is provided by the patient. No language interpreter was used.    HPI Comments: RENOTTA TROMPETER is a 53 y.o. female, with a h/o DM, HTN, HLD, arthritis, hypothyroidism, anxiety, GERD, and BV who presents to the Emergency Department complaining of a fall which occurred when she slipped on ice this morning. The pt denies heat impact or LOC. She is complaining of posterior left knee pain and sacral pain which is worse with movement and which she rates as 5/10. She has treated the pain with ice. The pt denies pain to her left foot or ankle, numbness/parestheisa, weakness, and urinary/bladder incontinence.  Additionally, she denies neck pain, a headache, abdominal pain, nausea, or chest pain. She reports she had back surgery in 2012 for spinal stenosis in which 5 screws were placed. The pt states she has not been assessing her glucose levels daily because she has a limited number of strips; she returns to her PCP later this week. She is not currently taking DM medication stating she is "borderline." Has an abrasion to the right elbow without associated pain. She is unsure if her tetanus is up to date.    Past Medical History  Diagnosis Date  . BV (bacterial vaginosis)   . Hypertension   . Hyperlipidemia   . Arthritis   . Hypothyroidism 12/06/2011  . GERD (gastroesophageal reflux disease) 12/06/2011  . Anxiety 12/06/2011  . Diabetes mellitus without complication    Past Surgical History  Procedure Laterality Date  . Cholecystectomy    . Abdominal hysterectomy       uterine prolapse   Family History  Problem Relation Age of Onset  . Hypertension Mother   . Hypertension Father   . Hypertension Sister   . Breast cancer Maternal Aunt     >50; passed away from it  . Breast cancer Maternal Aunt     >50   History  Substance Use Topics  . Smoking status: Former Smoker    Quit date: 01/26/1991  . Smokeless tobacco: Never Used  . Alcohol Use: Yes     Comment: rarely   OB History   Grav Para Term Preterm Abortions TAB SAB Ect Mult Living   2 2        2      Review of Systems  Respiratory: Negative for cough and shortness of breath.   Cardiovascular: Negative for chest pain and leg swelling.  Gastrointestinal: Negative for nausea, vomiting and abdominal pain.  Genitourinary: Negative for urgency.  Musculoskeletal: Positive for arthralgias and back pain. Negative for gait problem, joint swelling, myalgias, neck pain and neck stiffness.  Skin: Positive for wound.  Neurological: Negative for dizziness, syncope, weakness, numbness and headaches.  All other systems reviewed and are negative.    Allergies  Metronidazole; Adhesive; Penicillins; and Vesicare  Home Medications   Current Outpatient Rx  Name  Route  Sig  Dispense  Refill  . ALPRAZolam (XANAX) 0.25 MG tablet   Oral   Take 0.25 mg by mouth 2 (two)  times daily.         Marland Kitchen aspirin EC 81 MG tablet   Oral   Take 81 mg by mouth daily.         . clindamycin (CLEOCIN) 2 % vaginal cream      Apply vaginally for 5 nights   40 g   0   . clobetasol cream (TEMOVATE) 0.05 %   Topical   Apply topically 2 (two) times daily.   45 g   0   . diazepam (VALIUM) 5 MG tablet   Oral   Take 5 mg by mouth daily.         Marland Kitchen gabapentin (NEURONTIN) 300 MG capsule   Oral   Take 300 mg by mouth 3 (three) times daily.         Marland Kitchen HYDROcodone-acetaminophen (NORCO) 10-325 MG per tablet   Oral   Take 1 tablet by mouth every 6 (six) hours as needed. For pain         . levothyroxine  (SYNTHROID, LEVOTHROID) 75 MCG tablet   Oral   Take 75 mcg by mouth daily before breakfast.         . meloxicam (MOBIC) 15 MG tablet   Oral   Take 15 mg by mouth daily.         . metoprolol tartrate (LOPRESSOR) 25 MG tablet   Oral   Take 25 mg by mouth 2 (two) times daily.         . nabumetone (RELAFEN) 750 MG tablet   Oral   Take 1,500 mg by mouth 2 (two) times daily.         Marland Kitchen omega-3 acid ethyl esters (LOVAZA) 1 G capsule   Oral   Take 1 g by mouth daily.         . pantoprazole (PROTONIX) 40 MG tablet   Oral   Take 40 mg by mouth daily.         . Pitavastatin Calcium (LIVALO) 2 MG TABS   Oral   Take 0.5 tablets (1 mg total) by mouth daily.   14 tablet   0     Lot: [id number] Exp: 02/2014   . Polyethyl Glycol-Propyl Glycol (SYSTANE OP)   Both Eyes   Place 1 drop into both eyes daily as needed. For dry eyes         . valsartan-hydrochlorothiazide (DIOVAN-HCT) 160-12.5 MG per tablet   Oral   Take 1 tablet by mouth daily.          Triage Vitals: BP 128/71  Pulse 66  Temp(Src) 98.1 F (36.7 C) (Oral)  Resp 16  SpO2 97%  Filed Vitals:   03/25/13 1205 03/25/13 1438  BP: 128/71   Pulse: 66 54  Temp: 98.1 F (36.7 C)   TempSrc: Oral   Resp: 16   SpO2: 97% 97%    Physical Exam  Nursing note and vitals reviewed. Constitutional: She is oriented to person, place, and time. She appears well-developed and well-nourished. No distress.  HENT:  Head: Normocephalic and atraumatic.  Nose: Nose normal.  Mouth/Throat: Oropharynx is clear and moist.  Eyes: Conjunctivae and EOM are normal. Pupils are equal, round, and reactive to light.  Neck: Normal range of motion. Neck supple. No tracheal deviation present.  No cervical spinal or paraspinal tenderness to palpation throughout. No limitations with neck ROM.    Cardiovascular: Normal rate, regular rhythm and normal heart sounds.  Exam reveals no gallop and no friction rub.  No murmur heard. Dorsalis  pedis pulses present and equal bilaterally  Pulmonary/Chest: Effort normal and breath sounds normal. No respiratory distress. She has no wheezes. She has no rales. She exhibits no tenderness.  Abdominal: Soft. She exhibits no distension. There is no tenderness.  Musculoskeletal: Normal range of motion. She exhibits tenderness. She exhibits no edema.  Tenderness to palpation to the left anterior and posterior knee diffusely. No palpable masses or edema/joint effusion. Pain not limited but is increased with flexion and extension of the left knee. Tenderness to palpation to the lumbar and sacral spine. No paraspinal tenderness. No thoracic spinal tenderness. Strength 5/5 in the upper and lower extremities bilaterally. Patient able to ambulate without difficulty or ataxia. No calf, ankle or foot tenderness. No tenderness to palpation to the UE bilaterally.   Neurological: She is alert and oriented to person, place, and time.  Skin: Skin is warm and dry. She is not diaphoretic.     1 cm abrasion to the right elbow with no open wounds or surrounding edema. No tenderness to palpation  Psychiatric: She has a normal mood and affect. Her behavior is normal.    ED Course  Procedures (including critical care time)  DIAGNOSTIC STUDIES: Oxygen Saturation is 97% on room air, normal by my interpretation.    COORDINATION OF CARE:  1:17 PM- Discussed treatment plan with patient, which includes imaging, and the patient agreed to the plan. She denies a need for immediate pain medication.   2:13 PM- Rechecked pt. Informed pt of imaging results.   Labs Review Labs Reviewed - No data to display Imaging Review Dg Lumbar Spine Complete  03/25/2013   CLINICAL DATA:  Fall.  EXAM: LUMBAR SPINE - COMPLETE 4+ VIEW; SACRUM AND COCCYX - 2+ VIEW  COMPARISON:  05/01/2009 and 01/07/2012  FINDINGS: Vertebral body alignment and heights are within normal. There is mild spondylosis present. There is no compression fracture or  subluxation. Posterior fusion hardware with pedicle screws is present and intact from L3-L5. Intervertebral cages present at the L4-5 level.  There is moderate sclerosis along the sacroiliac joints left greater than right without significant change. There is stable sclerosis of the symphysis pubis joint. There are mild degenerative changes of the hips. There is no acute fracture.  IMPRESSION: No acute findings.  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  Moderate sclerosis over the sacroiliac joints left greater than right and sclerosis of the symphysis pubis joint as these findings are stable.   Electronically Signed   By: Marin Olp M.D.   On: 03/25/2013 13:58   Dg Sacrum/coccyx  03/25/2013   CLINICAL DATA:  Fall.  EXAM: LUMBAR SPINE - COMPLETE 4+ VIEW; SACRUM AND COCCYX - 2+ VIEW  COMPARISON:  05/01/2009 and 01/07/2012  FINDINGS: Vertebral body alignment and heights are within normal. There is mild spondylosis present. There is no compression fracture or subluxation. Posterior fusion hardware with pedicle screws is present and intact from L3-L5. Intervertebral cages present at the L4-5 level.  There is moderate sclerosis along the sacroiliac joints left greater than right without significant change. There is stable sclerosis of the symphysis pubis joint. There are mild degenerative changes of the hips. There is no acute fracture.  IMPRESSION: No acute findings.  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  Moderate sclerosis over the sacroiliac joints left greater than right and sclerosis of the symphysis pubis joint as these findings are stable.  Electronically Signed   By: Marin Olp M.D.   On: 03/25/2013 13:58   Dg Knee Complete 4 Views Left  03/25/2013   CLINICAL DATA:  Fall.  EXAM: LEFT KNEE - COMPLETE 4+ VIEW  COMPARISON:  None.  FINDINGS: There is moderate tricompartmental osteoarthritis. There is no  acute fracture or dislocation.  IMPRESSION: No acute fracture.  Osteoarthritis.   Electronically Signed   By: Marin Olp M.D.   On: 03/25/2013 13:53     EKG Interpretation None      MDM   MIRELA RAVENEL is a 53 y.o. female with a h/o DM, HTN, HLD, arthritis, hypothyroidism, anxiety, GERD, and BV who presents to the Emergency Department complaining of a fall which occurred when she slipped on ice this morning. Etiology of back and knee pain possibly due to contusion vs sprain/strain. X-rays negative for fx or malalignment, however, did show chronic degenerative and arthritic changes. Patient neurovascularly intact with no focal neurological deficits. Given knee sleeve. RICE method discussed. Has appointment with PCP this week. Return precautions, discharge instructions, and follow-up was discussed with the patient before discharge.     Discharge Medication List as of 03/25/2013  2:20 PM    START taking these medications   Details  HYDROcodone-acetaminophen (NORCO/VICODIN) 5-325 MG per tablet Take 1 tablet by mouth every 4 (four) hours as needed., Starting 03/25/2013, Until Discontinued, Print         Final impressions: 1. Fall   2. Coccyxdynia   3. Left knee pain       Denman George   I personally performed the services described in this documentation, which was scribed in my presence. The recorded information has been reviewed and is accurate.       Lucila [place], PA-C 03/27/13 1234

## 2013-03-28 DIAGNOSIS — M25519 Pain in unspecified shoulder: Secondary | ICD-10-CM | POA: Insufficient documentation

## 2013-03-28 NOTE — ED Provider Notes (Signed)
Medical screening examination/treatment/procedure(s) were performed by non-physician practitioner and as supervising physician I was immediately available for consultation/collaboration.   EKG Interpretation None        Hoy Morn, MD 03/28/13 1322

## 2013-05-08 ENCOUNTER — Other Ambulatory Visit (HOSPITAL_COMMUNITY): Payer: Self-pay | Admitting: Orthopedic Surgery

## 2013-05-08 DIAGNOSIS — M75100 Unspecified rotator cuff tear or rupture of unspecified shoulder, not specified as traumatic: Secondary | ICD-10-CM

## 2013-05-10 ENCOUNTER — Ambulatory Visit (INDEPENDENT_AMBULATORY_CARE_PROVIDER_SITE_OTHER): Payer: 59 | Admitting: Cardiology

## 2013-05-10 ENCOUNTER — Encounter: Payer: Self-pay | Admitting: Cardiology

## 2013-05-10 VITALS — BP 132/78 | HR 63 | Ht 63.0 in | Wt 284.0 lb

## 2013-05-10 DIAGNOSIS — R609 Edema, unspecified: Secondary | ICD-10-CM

## 2013-05-10 DIAGNOSIS — E785 Hyperlipidemia, unspecified: Secondary | ICD-10-CM

## 2013-05-10 DIAGNOSIS — I272 Pulmonary hypertension, unspecified: Secondary | ICD-10-CM

## 2013-05-10 DIAGNOSIS — Z6841 Body Mass Index (BMI) 40.0 and over, adult: Secondary | ICD-10-CM

## 2013-05-10 DIAGNOSIS — K7689 Other specified diseases of liver: Secondary | ICD-10-CM

## 2013-05-10 DIAGNOSIS — I2789 Other specified pulmonary heart diseases: Secondary | ICD-10-CM

## 2013-05-10 DIAGNOSIS — K760 Fatty (change of) liver, not elsewhere classified: Secondary | ICD-10-CM

## 2013-05-10 DIAGNOSIS — I10 Essential (primary) hypertension: Secondary | ICD-10-CM

## 2013-05-10 DIAGNOSIS — Z79899 Other long term (current) drug therapy: Secondary | ICD-10-CM

## 2013-05-10 DIAGNOSIS — R600 Localized edema: Secondary | ICD-10-CM

## 2013-05-10 MED ORDER — FUROSEMIDE 20 MG PO TABS: 20.0000 mg | ORAL_TABLET | Freq: Every day | ORAL | Status: DC

## 2013-05-10 NOTE — Patient Instructions (Signed)
Start lasix 20 mg  Daily  Labs-- bmp ,pro bnp  Your physician has requested that you have an echocardiogram. Echocardiography is a painless test that uses sound waves to create images of your heart. It provides your doctor with information about the size and shape of your heart and how well your heart's chambers and valves are working. This procedure takes approximately one hour. There are no restrictions for this procedure.  Your physician has requested that you have a lower  extremity venous duplex  For bilateral lower  edema. This test is an ultrasound of the veins in the legs . It looks at venous blood flow that carries blood from the heart to the legs . Allow one hour for a Lower Venous exam.  There are no restrictions or special instructions.   Your physician recommends that you schedule a follow-up appointment in 1 month after test with Ellyn Hack

## 2013-05-11 ENCOUNTER — Ambulatory Visit (HOSPITAL_COMMUNITY)
Admission: RE | Admit: 2013-05-11 | Discharge: 2013-05-11 | Disposition: A | Discharge status: Home / Self Care | Payer: 59 | Source: Ambulatory Visit | Attending: Orthopedic Surgery | Admitting: Orthopedic Surgery

## 2013-05-11 DIAGNOSIS — M75100 Unspecified rotator cuff tear or rupture of unspecified shoulder, not specified as traumatic: Secondary | ICD-10-CM

## 2013-05-11 LAB — BASIC METABOLIC PANEL
BUN: 13 mg/dL (ref 6–23)
CALCIUM: 9.2 mg/dL (ref 8.4–10.5)
CHLORIDE: 102 meq/L (ref 96–112)
CO2: 27 meq/L (ref 19–32)
Creat: 0.78 mg/dL (ref 0.50–1.10)
GLUCOSE: 92 mg/dL (ref 70–99)
Potassium: 4.3 mEq/L (ref 3.5–5.3)
Sodium: 140 mEq/L (ref 135–145)

## 2013-05-12 ENCOUNTER — Encounter: Payer: Self-pay | Admitting: Cardiology

## 2013-05-12 DIAGNOSIS — I272 Pulmonary hypertension, unspecified: Secondary | ICD-10-CM | POA: Insufficient documentation

## 2013-05-12 DIAGNOSIS — R600 Localized edema: Secondary | ICD-10-CM | POA: Insufficient documentation

## 2013-05-12 DIAGNOSIS — Z6841 Body Mass Index (BMI) 40.0 and over, adult: Secondary | ICD-10-CM

## 2013-05-12 LAB — PRO B NATRIURETIC PEPTIDE: Pro B Natriuretic peptide (BNP): 100.1 pg/mL (ref <126)

## 2013-05-12 NOTE — Assessment & Plan Note (Addendum)
As his symptoms such as PND or orthopnea, is difficult to presume that this could be related to left heart failure. However cannot exclude elevated pulmonary pressures with mild RV failure in an obese patient. I do not see that she has been diagnosed with OSA, but may very well have some obstructive sleep disorder that could increase pulmonary pressures. From a noncardiac standpoint, she also has mild varicosities which suggested some swelling at the venous insufficiency. Waking and also be hormonal in nature which is not uncommon in morbidly obese.  Plan: 2-D Echocardiogram and Venous Insufficiency Dopplers; add Lasix 20 mg daily with the option of taking an additional dose if edema is worse.

## 2013-05-12 NOTE — Assessment & Plan Note (Signed)
This is a potential diagnosis and will be evaluated by echocardiogram.

## 2013-05-12 NOTE — Assessment & Plan Note (Signed)
Relatively well controlled for her not being a diabetic

## 2013-05-12 NOTE — Assessment & Plan Note (Signed)
As of last year her cholesterol panel was relatively well controlled with an LDL of 59 and HDL of 71. She has actually been started on Livalo, which she seems to be tolerating well. Needs to loose weight.

## 2013-05-12 NOTE — Assessment & Plan Note (Signed)
Unfortunately, she continues to be a weight at time of her last visit with Dr. Rollene Fare she gained from 267-285pounds and she is still currently 284 pounds. She is not exercising, and clearly is not doing well with her dietary discretion. I again admonished her to be more careful what she eats and didn't ensure that she gets exercise we discussed different diet recommendations mostly notable for having low proteins and fats as well as carbohydrates with more vegetables.

## 2013-05-12 NOTE — Assessment & Plan Note (Signed)
This goes along with her morbid obesity. Also probably explains her having pancreatitis. Again reiterated the importance of weight loss with dietary modification and initiating exercise.

## 2013-05-12 NOTE — Progress Notes (Signed)
PATIENT: [name] MRN: [id number]  DOB: 1960/03/10   DOV:05/12/2013 PCP: Velna Hatchet, MD  Clinic Note: Chief Complaint  Patient presents with  . Edema    EDEMA FEET AND LEGS FOR ABOUT 2 WEEKS, NO CHEST APIN , SOB    HPI: [name] is a 53 y.o.  female with a PMH below who presents today for reestablishing her cardiology care after retirement of Dr. Rollene Fare, however she is not new to our practice. She is actually an employee of this office, or formerly [employer] [facility], and currently [place]. She was actually admitted to [facility] back in November of 2013 for social chest pain actually in the becoming acute pancreatitis as opposed to a cardiac etiology. She is a very pleasant yet morbidly obese woman who formerly saw Dr. Rollene Fare in this office. She was evaluated in 2000; a catheterization which showed no evidence of significant coronary disease to simply catheter-induced spasm. She can to knees to not get regular exercise, but is on her feet motion at the office walking back and forth. Unfortunately she has not lost weight since her last visit.  Interval History: She is referred back today by her PCP do to concern for worsening pedal edema for about the last 2 weeks. She really denies any significant change in symptoms otherwise. She denies any dyspnea except with moderate to extreme exertion. No chest tightness pressure with rest her exertion. She notes that she has a cramping aching sensation in her leg as swelling goes up and down in the feet. Some days are worse than others. She denies any PND, or orthopnea.  The remainder of Cardiovascular ROS: positive for - dyspnea on exertion and edema negative for - chest pain, irregular heartbeat, loss of consciousness, murmur, orthopnea, palpitations, paroxysmal nocturnal dyspnea, rapid heart rate or shortness of breath, lightheadedness, dizziness, syncope/near syncope,  TIA/amaurosis fugax symptoms, melena, hematochezia, hematuria, epistaxis, claudication.  Past Medical History  Diagnosis Date  . BV (bacterial vaginosis)   . Hypertension   . Hyperlipidemia   . Arthritis   . Hypothyroidism 12/06/2011  . GERD (gastroesophageal reflux disease) 12/06/2011  . Anxiety 12/06/2011  . Diabetes mellitus without complication   . Morbid obesity with BMI of 50.0-59.9, adult     Prior Cardiac Evaluation and Past Surgical History: Past Surgical History  Procedure Laterality Date  . Cholecystectomy    . Abdominal hysterectomy      uterine prolapse  . Cardiac catheterization  2007    NORMAL; catheter-induced spasm    Allergies  Allergen Reactions  . Metronidazole     Developed pancreatitis after taking this  . Adhesive [Tape]   . Penicillins Rash  . Vesicare [Solifenacin] Rash    Current Outpatient Prescriptions  Medication Sig Dispense Refill  . aspirin EC 81 MG tablet Take 81 mg by mouth every other day.       . clindamycin (CLEOCIN) 2 % vaginal cream Apply vaginally for 5 nights  40 g  0  . furosemide (LASIX) 20 MG tablet Take 1 tablet (20 mg total) by mouth daily.  30 tablet  6  . gabapentin (NEURONTIN) 300 MG capsule Take 300 mg by mouth 2 (two) times daily.       Marland Kitchen HYDROcodone-acetaminophen (NORCO) 10-325 MG per tablet Take 1 tablet by mouth every 6 (six) hours as needed. For pain      . HYDROcodone-acetaminophen (NORCO/VICODIN) 5-325 MG per tablet Take 1 tablet by mouth  every 4 (four) hours as needed.  6 tablet  0  . levothyroxine (SYNTHROID, LEVOTHROID) 75 MCG tablet Take 75 mcg by mouth daily before breakfast.      . meloxicam (MOBIC) 15 MG tablet Take 15 mg by mouth daily.      . metoprolol tartrate (LOPRESSOR) 25 MG tablet Take 25 mg by mouth 2 (two) times daily.      . pantoprazole (PROTONIX) 40 MG tablet Take 40 mg by mouth daily.      . Pitavastatin Calcium (LIVALO) 2 MG TABS Take 0.5 tablets (1 mg total) by mouth daily.  14 tablet  0  .  Polyethyl Glycol-Propyl Glycol (SYSTANE OP) Place 1 drop into both eyes daily as needed. For dry eyes      . valsartan-hydrochlorothiazide (DIOVAN-HCT) 160-12.5 MG per tablet Take 1 tablet by mouth daily.       No current facility-administered medications for this visit.    History   Social History Narrative  . No narrative on file    ROS: A comprehensive Review of Systems - Negative except Symptoms noted in history of present illness. She does note some mild varicose veins but nothing significant. Otherwise no major complaints. Has had no further episodes that would suggest pancreatitis. She does have occasional GERD-type symptoms.  PHYSICAL EXAM BP 132/78  Pulse 63  Ht 5\' 3"  (1.6 m)  Wt 284 lb (128.822 kg)  BMI 50.32 kg/m2 General appearance: alert, cooperative, appears stated age, no distress, morbidly obese and Pleasant mood and affect. Neck: no adenopathy, no carotid bruit, no JVD and supple, symmetrical, trachea midline Lungs: clear to auscultation bilaterally, normal percussion bilaterally and Nonlabored, good air movement Heart: Distant heart sounds without was RRR, normal S1 and S2. No gallops or rubs. Difficult to hear but there may be a soft systolic murmur that is hard to tell exactly where is located. Abdomen: soft, non-tender; bowel sounds normal; no masses,  no organomegaly and Morbidly obese Extremities: edema plus at the most. Mild spider veins and varicose veins noted but nothing significant. Pulses: 2+ and symmetric Neurologic: Grossly normal   Adult ECG Report   Rate: 63 ;  Rhythm: normal sinus rhythm with sinus arrhythmia Normal intervals and axis. No significant ST and T changes.  Narrative Interpretation: Normal EKG  Recent Labs: No labs available since her last visit with Dr. Rollene Fare in April 2014.  ASSESSMENT / PLAN: Bilateral lower extremity edema As his symptoms such as PND or orthopnea, is difficult to presume that this could be related to left  heart failure. However cannot exclude elevated pulmonary pressures with mild RV failure in an obese patient. I do not see that she has been diagnosed with OSA, but may very well have some obstructive sleep disorder that could increase pulmonary pressures. From a noncardiac standpoint, she also has mild varicosities which suggested some swelling at the venous insufficiency. Waking and also be hormonal in nature which is not uncommon in morbidly obese.  Plan: 2-D Echocardiogram and Venous Insufficiency Dopplers; add Lasix 20 mg daily with the option of taking an additional dose if edema is worse.  Pulmonary HTN This is a potential diagnosis and will be evaluated by echocardiogram.  Hypertension Relatively well controlled for her not being a diabetic  Hyperlipidemia As of last year her cholesterol panel was relatively well controlled with an LDL of 59 and HDL of 71. She has actually been started on Livalo, which she seems to be tolerating well. Needs to loose weight.  Morbid obesity with BMI of 50.0-59.9, adult Unfortunately, she continues to be a weight at time of her last visit with Dr. Rollene Fare she gained from 267-285pounds and she is still currently 284 pounds. She is not exercising, and clearly is not doing well with her dietary discretion. I again admonished her to be more careful what she eats and didn't ensure that she gets exercise we discussed different diet recommendations mostly notable for having low proteins and fats as well as carbohydrates with more vegetables.  Fatty liver This goes along with her morbid obesity. Also probably explains her having pancreatitis. Again reiterated the importance of weight loss with dietary modification and initiating exercise.    Orders Placed This Encounter  Procedures  . Basic metabolic panel  . Pro b natriuretic peptide (BNP)  . EKG 12-Lead    Order Specific Question:  Where should this test be performed    Answer:  OTHER  . 2D  Echocardiogram without contrast    Dx , r/o pulm htn; lower extrem edema    Standing Status: Future     Number of Occurrences:      Standing Expiration Date: 05/10/2014    Order Specific Question:  Type of Echo    Answer:  Complete    Order Specific Question:  Where should this test be performed    Answer:  [facility]    Order Specific Question:  Reason for exam-Echo    Answer:  Other - See Comments Section    Order Specific Question:  Reason for exam-Echo    Answer:  Other - See Comments Section    Order Specific Question:  Reason for exam-Echo    Answer:  Chest Pain  786.50   Meds ordered this encounter  Medications  . DISCONTD: furosemide (LASIX) 20 MG tablet    Sig: Take 1 tablet (20 mg total) by mouth daily.    Dispense:  90 tablet    Refill:  3  . furosemide (LASIX) 20 MG tablet    Sig: Take 1 tablet (20 mg total) by mouth daily.    Dispense:  30 tablet    Refill:  6    Followup: Following her tests in roughly 1 month DAVID W. Ellyn Hack, M.D., M.S. Interventional Cardiology [facility]

## 2013-05-13 NOTE — Progress Notes (Signed)
Quick Note:  Labs look normal. Normal ProBNP would suggest that swelling is not related to the heart.  Leonie Man, MD  ______

## 2013-05-14 ENCOUNTER — Other Ambulatory Visit: Payer: Self-pay | Admitting: *Deleted

## 2013-05-14 ENCOUNTER — Ambulatory Visit (HOSPITAL_COMMUNITY)
Admission: RE | Admit: 2013-05-14 | Discharge: 2013-05-14 | Disposition: A | Discharge status: Home / Self Care | Payer: 59 | Source: Ambulatory Visit | Attending: Cardiovascular Disease | Admitting: Cardiovascular Disease

## 2013-05-14 DIAGNOSIS — M7989 Other specified soft tissue disorders: Secondary | ICD-10-CM

## 2013-05-14 DIAGNOSIS — R609 Edema, unspecified: Secondary | ICD-10-CM

## 2013-05-14 NOTE — Progress Notes (Signed)
Venous Duplex Completed. Oda Cogan, BS, RDMS, RVT

## 2013-05-15 ENCOUNTER — Telehealth: Payer: Self-pay | Admitting: *Deleted

## 2013-05-15 NOTE — Telephone Encounter (Signed)
Spoke to patient. Result given . Verbalized understanding  

## 2013-05-15 NOTE — Telephone Encounter (Signed)
Message copied by Raiford Simmonds on Tue May 15, 2013  8:25 AM ------      Message from: Ellyn Hack, DAVID W      Created: Sun May 13, 2013  3:52 PM       Labs look normal.  Normal ProBNP would suggest that swelling is not related to the heart.            Leonie Man, MD       ------

## 2013-05-18 ENCOUNTER — Ambulatory Visit (HOSPITAL_COMMUNITY)
Admission: RE | Admit: 2013-05-18 | Discharge: 2013-05-18 | Disposition: A | Discharge status: Home / Self Care | Payer: 59 | Source: Ambulatory Visit | Attending: Cardiology | Admitting: Cardiology

## 2013-05-18 DIAGNOSIS — R609 Edema, unspecified: Secondary | ICD-10-CM | POA: Insufficient documentation

## 2013-05-18 DIAGNOSIS — I10 Essential (primary) hypertension: Secondary | ICD-10-CM | POA: Insufficient documentation

## 2013-05-18 DIAGNOSIS — R600 Localized edema: Secondary | ICD-10-CM

## 2013-05-18 DIAGNOSIS — I272 Pulmonary hypertension, unspecified: Secondary | ICD-10-CM

## 2013-05-18 DIAGNOSIS — I059 Rheumatic mitral valve disease, unspecified: Secondary | ICD-10-CM

## 2013-05-18 NOTE — Progress Notes (Signed)
2D Echo Performed 05/18/2013    Anisa Leanos, RCS  

## 2013-05-19 NOTE — Progress Notes (Signed)
Quick Note:  Echo results: Good news: Essentially normal echocardiogram and normal pump function and normal valve function. No signs to suggest heart attack.. EF: 55-60%. No regional wall motion abnormalities.  Grade 1 diastolic dysfunction - mildly abnormal relaxation (not uncommon in pateints > 50)  Leonie Man, MD    ______

## 2013-05-22 ENCOUNTER — Telehealth: Payer: Self-pay | Admitting: *Deleted

## 2013-05-22 NOTE — Telephone Encounter (Signed)
Message copied by Raiford Simmonds on Tue May 22, 2013  9:57 AM ------      Message from: Leonie Man      Created: Sat May 19, 2013  8:38 AM       Echo results:      Good news: Essentially normal echocardiogram and normal pump function and normal valve function.  No signs to suggest heart attack..      EF: 55-60%.      No regional wall motion abnormalities.        Grade 1 diastolic dysfunction - mildly abnormal relaxation (not uncommon in pateints > 50)            Leonie Man, MD                   ------

## 2013-05-22 NOTE — Telephone Encounter (Signed)
Spoke to patient. Result given . Verbalized understanding  

## 2013-05-23 ENCOUNTER — Telehealth: Payer: Self-pay | Admitting: *Deleted

## 2013-05-23 NOTE — Telephone Encounter (Signed)
Spoke to patient. Result given . Verbalized understanding  

## 2013-05-23 NOTE — Telephone Encounter (Signed)
Message copied by Raiford Simmonds on Wed May 23, 2013  7:57 AM ------      Message from: Leonie Man      Created: Tue May 22, 2013  5:41 PM       Superficial & deep venous insufficiency noted - no clot.            Recommend Support Stockings.  This probably explains swelling.            Prop feet up @ end of day.            Leonie Man, MD       ------

## 2013-05-25 ENCOUNTER — Encounter: Payer: Self-pay | Admitting: Cardiovascular Disease

## 2013-05-25 DIAGNOSIS — I872 Venous insufficiency (chronic) (peripheral): Secondary | ICD-10-CM | POA: Insufficient documentation

## 2013-05-31 ENCOUNTER — Encounter: Payer: Self-pay | Admitting: *Deleted

## 2013-06-08 ENCOUNTER — Ambulatory Visit (INDEPENDENT_AMBULATORY_CARE_PROVIDER_SITE_OTHER): Payer: 59 | Admitting: Cardiovascular Disease

## 2013-06-08 ENCOUNTER — Telehealth: Payer: Self-pay | Admitting: *Deleted

## 2013-06-08 ENCOUNTER — Encounter: Payer: Self-pay | Admitting: Cardiovascular Disease

## 2013-06-08 VITALS — BP 123/82 | HR 59 | Resp 16 | Ht 63.0 in | Wt 281.7 lb

## 2013-06-08 DIAGNOSIS — I10 Essential (primary) hypertension: Secondary | ICD-10-CM

## 2013-06-08 DIAGNOSIS — E785 Hyperlipidemia, unspecified: Secondary | ICD-10-CM

## 2013-06-08 DIAGNOSIS — E782 Mixed hyperlipidemia: Secondary | ICD-10-CM

## 2013-06-08 DIAGNOSIS — Z79899 Other long term (current) drug therapy: Secondary | ICD-10-CM

## 2013-06-08 NOTE — Telephone Encounter (Signed)
Did not have recent Lipid and CMP.  Order given to patient.

## 2013-06-08 NOTE — Patient Instructions (Signed)
Dr. Croitoru recommends that you schedule a follow-up appointment in: ONE YEAR   

## 2013-06-08 NOTE — Progress Notes (Signed)
Patient ID: [name], female   DOB: March 11, 1960, 53 y.o.   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 Terance Ice.   She has a long-time history of hyperlipidemia and hypertension and has super morbid obesity. She underwent coronary angiography in 2007 and did not have evidence of coronary artery disease. 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 In 2013 she was hospitalized with chest pain, apparently related to drug-induced pancreatitis (metronidazole). In 2015 she underwent an echocardiogram that is essentially normal except for mild diastolic dysfunction. She had a cholecystectomy in her 50s and steatohepatitis by imaging studies. She has gastroesophageal reflux disease which is occasionally symptomatic. She has multiple arthralgias and needs to take a nonsteroidal anti-inflammatory drug a regular basis. She has chronic low back pain despite lumbar spine surgery in 2012.  She has no active cardiac complaints. She recently had labs performed with her primary care physician Dr. Ardeth Perfect and will bring [place] a copy tomorrow. She has had a lot of problem with allergies and sinus pressure this spring. Antihistamines have alleviated nasal drainage, but she continues to have a throbbing headache.  Allergies  Allergen Reactions  . Metronidazole     Developed pancreatitis after taking this  . Adhesive [Tape]   . Penicillins Rash  . Vesicare [Solifenacin] Rash    Current Outpatient Prescriptions  Medication Sig Dispense Refill  . aspirin EC 81 MG tablet Take 81 mg by mouth every other day.       . clindamycin (CLEOCIN) 2 % vaginal cream Apply vaginally for 5 nights  40 g  0  . furosemide (LASIX) 20 MG tablet Take 1 tablet (20 mg total) by mouth daily.  30 tablet  6  . gabapentin (NEURONTIN) 300 MG capsule Take  300 mg by mouth 2 (two) times daily.       Marland Kitchen HYDROcodone-acetaminophen (NORCO) 10-325 MG per tablet Take 1 tablet by mouth every 6 (six) hours as needed. For pain      . levothyroxine (SYNTHROID, LEVOTHROID) 75 MCG tablet Take 75 mcg by mouth daily before breakfast.      . meloxicam (MOBIC) 15 MG tablet Take 15 mg by mouth daily.      . metoprolol tartrate (LOPRESSOR) 25 MG tablet Take 25 mg by mouth 2 (two) times daily.      . pantoprazole (PROTONIX) 40 MG tablet Take 40 mg by mouth daily.      . Pitavastatin Calcium (LIVALO) 2 MG TABS Take 0.5 tablets (1 mg total) by mouth daily.  14 tablet  0  . Polyethyl Glycol-Propyl Glycol (SYSTANE OP) Place 1 drop into both eyes daily as needed. For dry eyes      . valsartan-hydrochlorothiazide (DIOVAN-HCT) 160-12.5 MG per tablet Take 1 tablet by mouth daily.       No current facility-administered medications for this visit.    Past Medical History  Diagnosis Date  . BV (bacterial vaginosis)   . Hypertension   . Hyperlipidemia   . Arthritis   . Hypothyroidism 12/06/2011  . GERD (gastroesophageal reflux disease) 12/06/2011  . Anxiety 12/06/2011  . Diabetes mellitus without complication   . Morbid obesity with BMI of 50.0-59.9, adult     Past Surgical History  Procedure Laterality Date  . Cholecystectomy    . Abdominal hysterectomy      uterine prolapse  .  Cardiac catheterization  2007    NORMAL; catheter-induced spasm  . Nm myocar perf wall motion  01/20/2005    normal    Family History  Problem Relation Age of Onset  . Hypertension Mother   . Hypertension Father   . Hypertension Sister   . Kidney disease Sister     [facility] TRANSPLANT  . Thyroid disease Sister   . Breast cancer Maternal Aunt     >50; passed away from it  . Breast cancer Maternal Aunt     >50  . Thyroid disease Sister   . Hypertension Sister     History   Social History  . Marital Status: Married    Spouse Name: N/A    Number of Children: N/A  . Years of  Education: N/A   Occupational History  . Not on file.   Social History Main Topics  . Smoking status: Former Smoker    Quit date: 01/26/1991  . Smokeless tobacco: Never Used  . Alcohol Use: Yes     Comment: rarely  . Drug Use: No  . Sexual Activity: Yes    Birth Control/ Protection: Post-menopausal   Other Topics Concern  . Not on file   Social History Narrative  . No narrative on file    Review of systems: The patient specifically denies any chest pain at rest or with exertion, dyspnea at rest or with exertion, orthopnea, paroxysmal nocturnal dyspnea, syncope, palpitations, focal neurological deficits, intermittent claudication, lower extremity edema, unexplained weight gain, cough, hemoptysis or wheezing.  The patient also denies abdominal pain, nausea, vomiting, dysphagia, diarrhea, constipation, polyuria, polydipsia, dysuria, hematuria, frequency, urgency, abnormal bleeding or bruising, fever, chills, unexpected weight changes, mood swings, change in skin or hair texture, change in voice quality, auditory or visual problems, allergic reactions or rashes, new musculoskeletal complaints other than usual "aches and pains".   PHYSICAL EXAM BP 123/82  Pulse 59  Resp 16  Ht 5\' 3"  (1.6 m)  Wt 281 lb 11.2 oz (127.778 kg)  BMI 49.91 kg/m2  General: Alert, oriented x3, no distress, her exam is limited by obesity Head: no evidence of trauma, PERRL, EOMI, no exophtalmos or lid lag, no myxedema, no xanthelasma; normal ears, nose and oropharynx Neck: Acantosis, unable to see the jugular venous pulsations and no hepatojugular reflux; brisk carotid pulses without delay and no carotid bruits Chest: clear to auscultation, no signs of consolidation by percussion or palpation, normal fremitus, symmetrical and full respiratory excursions Cardiovascular: Cannot locate the apical impulse, regular rhythm, normal first and second heart sounds, no murmurs, rubs or gallops Abdomen: no tenderness or  distention, no masses by palpation, no abnormal pulsatility or arterial bruits, normal bowel sounds, no hepatosplenomegaly Extremities: no clubbing, cyanosis or edema; 2+ radial, ulnar and brachial pulses bilaterally; 2+ right femoral, posterior tibial and dorsalis pedis pulses; 2+ left femoral, posterior tibial and dorsalis pedis pulses; no subclavian or femoral bruits Neurological: grossly nonfocal   EKG: NSR   Lipid Panel     Component Value Date/Time   CHOL 142 12/06/2011 1000   TRIG 61 12/06/2011 1000   HDL 71 12/06/2011 1000   CHOLHDL 2.0 12/06/2011 1000   VLDL 12 12/06/2011 1000   LDLCALC 59 12/06/2011 1000    BMET    Component Value Date/Time   NA 140 05/11/2013 0806   K 4.3 05/11/2013 0806   CL 102 05/11/2013 0806   CO2 27 05/11/2013 0806   GLUCOSE 92 05/11/2013 0806   BUN 13 05/11/2013 0806  CREATININE 0.78 05/11/2013 0806   CREATININE 0.74 12/10/2011 0515   CALCIUM 9.2 05/11/2013 0806   GFRNONAA >90 12/10/2011 0515   GFRAA >90 12/10/2011 0515     ASSESSMENT AND PLAN  [name] has well treated hypertension and reportedly had a favorable lipid profile on her labs recently checked. Her cholesterol profile was excellent when we last checked in November of 2013 She does not have any symptoms of active cardiac illness.  Her biggest problem is super morbid obesity and she is at risk of developing diabetes mellitus and long-term vascular complications. Weight loss remains a very important long-term goal. She is not interested in bariatric surgery.  Her blood pressure is under excellent control. I think it is okay if she takes an occasional decongestant for her sinus problems and we will be able to monitor her blood pressure in the office on a daily basis if necessary.  [name]  Sanda Klein, MD, [facility] [phone] office [phone] pager

## 2013-06-20 LAB — COMPREHENSIVE METABOLIC PANEL
ALBUMIN: 3.8 g/dL (ref 3.5–5.2)
ALK PHOS: 101 U/L (ref 39–117)
ALT: 24 U/L (ref 0–35)
AST: 29 U/L (ref 0–37)
BUN: 17 mg/dL (ref 6–23)
CALCIUM: 9.4 mg/dL (ref 8.4–10.5)
CHLORIDE: 99 meq/L (ref 96–112)
CO2: 28 mEq/L (ref 19–32)
Creat: 0.91 mg/dL (ref 0.50–1.10)
Glucose, Bld: 96 mg/dL (ref 70–99)
Potassium: 4 mEq/L (ref 3.5–5.3)
SODIUM: 140 meq/L (ref 135–145)
TOTAL PROTEIN: 7.3 g/dL (ref 6.0–8.3)
Total Bilirubin: 0.4 mg/dL (ref 0.2–1.2)

## 2013-06-20 LAB — LIPID PANEL
CHOL/HDL RATIO: 2.8 ratio
Cholesterol: 167 mg/dL (ref 0–200)
HDL: 60 mg/dL (ref >39)
LDL Cholesterol: 84 mg/dL (ref 0–99)
Triglycerides: 115 mg/dL (ref <150)
VLDL: 23 mg/dL (ref 0–40)

## 2013-06-22 ENCOUNTER — Encounter (HOSPITAL_COMMUNITY): Payer: Self-pay | Admitting: Emergency Medicine

## 2013-06-22 ENCOUNTER — Emergency Department (HOSPITAL_COMMUNITY)
Admission: EM | Admit: 2013-06-22 | Discharge: 2013-06-22 | Disposition: A | Discharge status: Home / Self Care | Payer: 59 | Attending: Emergency Medicine | Admitting: Emergency Medicine

## 2013-06-22 DIAGNOSIS — Z791 Long term (current) use of non-steroidal anti-inflammatories (NSAID): Secondary | ICD-10-CM | POA: Insufficient documentation

## 2013-06-22 DIAGNOSIS — Z90710 Acquired absence of both cervix and uterus: Secondary | ICD-10-CM | POA: Insufficient documentation

## 2013-06-22 DIAGNOSIS — K219 Gastro-esophageal reflux disease without esophagitis: Secondary | ICD-10-CM | POA: Insufficient documentation

## 2013-06-22 DIAGNOSIS — E119 Type 2 diabetes mellitus without complications: Secondary | ICD-10-CM | POA: Insufficient documentation

## 2013-06-22 DIAGNOSIS — Z9089 Acquired absence of other organs: Secondary | ICD-10-CM | POA: Insufficient documentation

## 2013-06-22 DIAGNOSIS — R1013 Epigastric pain: Secondary | ICD-10-CM

## 2013-06-22 DIAGNOSIS — Z792 Long term (current) use of antibiotics: Secondary | ICD-10-CM | POA: Insufficient documentation

## 2013-06-22 DIAGNOSIS — Z3202 Encounter for pregnancy test, result negative: Secondary | ICD-10-CM | POA: Insufficient documentation

## 2013-06-22 DIAGNOSIS — Z9851 Tubal ligation status: Secondary | ICD-10-CM | POA: Insufficient documentation

## 2013-06-22 DIAGNOSIS — Z9889 Other specified postprocedural states: Secondary | ICD-10-CM | POA: Insufficient documentation

## 2013-06-22 DIAGNOSIS — Z79899 Other long term (current) drug therapy: Secondary | ICD-10-CM | POA: Insufficient documentation

## 2013-06-22 DIAGNOSIS — Z87440 Personal history of urinary (tract) infections: Secondary | ICD-10-CM | POA: Insufficient documentation

## 2013-06-22 DIAGNOSIS — R110 Nausea: Secondary | ICD-10-CM | POA: Insufficient documentation

## 2013-06-22 DIAGNOSIS — I10 Essential (primary) hypertension: Secondary | ICD-10-CM | POA: Insufficient documentation

## 2013-06-22 DIAGNOSIS — Z6841 Body Mass Index (BMI) 40.0 and over, adult: Secondary | ICD-10-CM | POA: Insufficient documentation

## 2013-06-22 DIAGNOSIS — M129 Arthropathy, unspecified: Secondary | ICD-10-CM | POA: Insufficient documentation

## 2013-06-22 DIAGNOSIS — Z7982 Long term (current) use of aspirin: Secondary | ICD-10-CM | POA: Insufficient documentation

## 2013-06-22 DIAGNOSIS — F411 Generalized anxiety disorder: Secondary | ICD-10-CM | POA: Insufficient documentation

## 2013-06-22 DIAGNOSIS — E039 Hypothyroidism, unspecified: Secondary | ICD-10-CM | POA: Insufficient documentation

## 2013-06-22 DIAGNOSIS — Z87891 Personal history of nicotine dependence: Secondary | ICD-10-CM | POA: Insufficient documentation

## 2013-06-22 DIAGNOSIS — E785 Hyperlipidemia, unspecified: Secondary | ICD-10-CM | POA: Insufficient documentation

## 2013-06-22 DIAGNOSIS — R6883 Chills (without fever): Secondary | ICD-10-CM | POA: Insufficient documentation

## 2013-06-22 HISTORY — DX: Venous insufficiency (chronic) (peripheral): I87.2

## 2013-06-22 LAB — COMPREHENSIVE METABOLIC PANEL
ALK PHOS: 104 U/L (ref 39–117)
ALT: 25 U/L (ref 0–35)
AST: 28 U/L (ref 0–37)
Albumin: 3.5 g/dL (ref 3.5–5.2)
BILIRUBIN TOTAL: 0.3 mg/dL (ref 0.3–1.2)
BUN: 16 mg/dL (ref 6–23)
CO2: 25 meq/L (ref 19–32)
Calcium: 8.7 mg/dL (ref 8.4–10.5)
Chloride: 97 mEq/L (ref 96–112)
Creatinine, Ser: 0.73 mg/dL (ref 0.50–1.10)
GLUCOSE: 137 mg/dL — AB (ref 70–99)
POTASSIUM: 3.9 meq/L (ref 3.7–5.3)
SODIUM: 136 meq/L — AB (ref 137–147)
Total Protein: 7.6 g/dL (ref 6.0–8.3)

## 2013-06-22 LAB — CBC WITH DIFFERENTIAL/PLATELET
Basophils Absolute: 0 10*3/uL (ref 0.0–0.1)
Basophils Relative: 0 % (ref 0–1)
EOS ABS: 0.3 10*3/uL (ref 0.0–0.7)
Eosinophils Relative: 4 % (ref 0–5)
HCT: 41 % (ref 36.0–46.0)
HEMOGLOBIN: 13.5 g/dL (ref 12.0–15.0)
LYMPHS ABS: 2.5 10*3/uL (ref 0.7–4.0)
LYMPHS PCT: 38 % (ref 12–46)
MCH: 27 pg (ref 26.0–34.0)
MCHC: 32.9 g/dL (ref 30.0–36.0)
MCV: 82 fL (ref 78.0–100.0)
MONOS PCT: 9 % (ref 3–12)
Monocytes Absolute: 0.6 10*3/uL (ref 0.1–1.0)
Neutro Abs: 3.2 10*3/uL (ref 1.7–7.7)
Neutrophils Relative %: 49 % (ref 43–77)
PLATELETS: 339 10*3/uL (ref 150–400)
RBC: 5 MIL/uL (ref 3.87–5.11)
RDW: 14.5 % (ref 11.5–15.5)
WBC: 6.5 10*3/uL (ref 4.0–10.5)

## 2013-06-22 LAB — POC URINE PREG, ED: PREG TEST UR: NEGATIVE

## 2013-06-22 LAB — URINALYSIS, ROUTINE W REFLEX MICROSCOPIC
Bilirubin Urine: NEGATIVE
Glucose, UA: NEGATIVE mg/dL
Hgb urine dipstick: NEGATIVE
Ketones, ur: NEGATIVE mg/dL
Leukocytes, UA: NEGATIVE
NITRITE: NEGATIVE
Protein, ur: NEGATIVE mg/dL
SPECIFIC GRAVITY, URINE: 1.021 (ref 1.005–1.030)
UROBILINOGEN UA: 1 mg/dL (ref 0.0–1.0)
pH: 5.5 (ref 5.0–8.0)

## 2013-06-22 LAB — LIPASE, BLOOD: LIPASE: 23 U/L (ref 11–59)

## 2013-06-22 LAB — I-STAT TROPONIN, ED: Troponin i, poc: 0 ng/mL (ref 0.00–0.08)

## 2013-06-22 MED ORDER — PANTOPRAZOLE SODIUM 40 MG IV SOLR
40.0000 mg | Freq: Once | INTRAVENOUS | Status: AC
  Administered 2013-06-22: 40 mg via INTRAVENOUS
  Filled 2013-06-22: qty 40

## 2013-06-22 MED ORDER — OMEPRAZOLE 20 MG PO CPDR
20.0000 mg | DELAYED_RELEASE_CAPSULE | Freq: Every day | ORAL | Status: DC
Start: 2013-06-22 — End: 2013-06-23

## 2013-06-22 MED ORDER — ONDANSETRON HCL 4 MG/2ML IJ SOLN
4.0000 mg | Freq: Once | INTRAMUSCULAR | Status: AC
  Administered 2013-06-22: 4 mg via INTRAVENOUS
  Filled 2013-06-22: qty 2

## 2013-06-22 MED ORDER — HYDROMORPHONE HCL PF 1 MG/ML IJ SOLN
1.0000 mg | Freq: Once | INTRAMUSCULAR | Status: AC
  Administered 2013-06-22: 1 mg via INTRAVENOUS
  Filled 2013-06-22: qty 1

## 2013-06-22 NOTE — ED Notes (Signed)
Pt reports central chest pain since 1000 this morning. Pt reports this feels similar to when she had pancreatitis. Pt reports belching, nausea, sob and lightheadedness. Pt speaking in complete sentences with no difficulty.

## 2013-06-22 NOTE — ED Provider Notes (Signed)
Medical screening examination/treatment/procedure(s) were conducted as a shared visit with non-physician practitioner(s) and myself.  I personally evaluated the patient during the encounter.   EKG Interpretation   Date/Time:  Friday Jun 22 2013 15:41:40 EDT Ventricular Rate:  80 PR Interval:  154 QRS Duration: 84 QT Interval:  378 QTC Calculation: 436 R Axis:   52 Text Interpretation:  Sinus rhythm Baseline wander in lead(s) II III aVF  Confirmed by Ashok Cordia  MD, Lennette Bihari ([id number]) on 06/22/2013 5:18:11 PM      Pt c/o epigastric pain. Dull. No back or flank pain. Not pleuritic. +heartburn. Report in prior notes of unremarkable cath 2007. ?hx pancreatitis. Labs. Pain rx.   Mirna Mires, MD 06/22/13 2003

## 2013-06-22 NOTE — ED Provider Notes (Signed)
CSN: [id number]     Arrival date & time 06/22/13  1534 History   First MD Initiated Contact with Patient 06/22/13 1551     Chief Complaint  Patient presents with  . Chest Pain     (Consider location/radiation/quality/duration/timing/severity/associated sxs/prior Treatment) HPI Comments: The patient is a 53 year old female with history of hypertension, hyperlipidemia, hypothyroidism, diabetes, pancreatitis who presents today with epigastric pain. She states that the pain began last night and was mild. It was associated with feeling chilled. She does not believe that she had a fever. She states that she had one episode of diaphoresis last night. The pain worsened this morning around 9 AM. Her pain is intermittent and last for a few minutes at a time. She describes the pain as dull. The pain does not radiate to her back or into her chest. She has had increased belching. She has not taken any medications to improve her pain. She was diagnosed with pancreatitis in 2013 and states this feels similar. She has not vomited. She has had prior cholecystectomy and tubal ligation. She is seen by cardiology and had a normal echo done in April. She was seen by Dr. Sallyanne Kuster on 5/15 and told she was doing well from cardiac standpoint, but was counseled on weight loss. She has no family history of early heart disease. She is a former smoker.  Patient is a 53 y.o. female presenting with chest pain. The history is provided by the patient. No language interpreter was used.  Chest Pain Associated symptoms: abdominal pain, diaphoresis and nausea   Associated symptoms: no fever, no shortness of breath and not vomiting     Past Medical History  Diagnosis Date  . BV (bacterial vaginosis)   . Hypertension   . Hyperlipidemia   . Arthritis   . Hypothyroidism 12/06/2011  . GERD (gastroesophageal reflux disease) 12/06/2011  . Anxiety 12/06/2011  . Diabetes mellitus without complication   . Morbid obesity with BMI of  50.0-59.9, adult   . Venous insufficiency of leg    Past Surgical History  Procedure Laterality Date  . Cholecystectomy    . Abdominal hysterectomy      uterine prolapse  . Cardiac catheterization  2007    NORMAL; catheter-induced spasm  . Nm myocar perf wall motion  01/20/2005    normal   Family History  Problem Relation Age of Onset  . Hypertension Mother   . Hypertension Father   . Hypertension Sister   . Kidney disease Sister     [facility] TRANSPLANT  . Thyroid disease Sister   . Breast cancer Maternal Aunt     >50; passed away from it  . Breast cancer Maternal Aunt     >50  . Thyroid disease Sister   . Hypertension Sister    History  Substance Use Topics  . Smoking status: Former Smoker    Quit date: 01/26/1991  . Smokeless tobacco: Never Used  . Alcohol Use: Yes     Comment: rarely   OB History   Grav Para Term Preterm Abortions TAB SAB Ect Mult Living   2 2        2      Review of Systems  Constitutional: Positive for chills and diaphoresis. Negative for fever.  Respiratory: Negative for shortness of breath.   Cardiovascular: Positive for chest pain.  Gastrointestinal: Positive for nausea and abdominal pain. Negative for vomiting.  All other systems reviewed and are negative.     Allergies  Metronidazole; Adhesive; Penicillins; and  Vesicare  Home Medications   Prior to Admission medications   Medication Sig Start Date End Date Taking? Authorizing Provider  aspirin EC 81 MG tablet Take 81 mg by mouth every other day.     Historical Provider, MD  clindamycin (CLEOCIN) 2 % vaginal cream Apply vaginally for 5 nights 10/19/12   Huel Cote, NP  furosemide (LASIX) 20 MG tablet Take 1 tablet (20 mg total) by mouth daily. 05/10/13   Leonie Man, MD  gabapentin (NEURONTIN) 300 MG capsule Take 300 mg by mouth 2 (two) times daily.     Historical Provider, MD  HYDROcodone-acetaminophen (NORCO) 10-325 MG per tablet Take 1 tablet by mouth every 6 (six) hours as  needed. For pain    Historical Provider, MD  levothyroxine (SYNTHROID, LEVOTHROID) 75 MCG tablet Take 75 mcg by mouth daily before breakfast.    Historical Provider, MD  meloxicam (MOBIC) 15 MG tablet Take 15 mg by mouth daily.    Historical Provider, MD  metoprolol tartrate (LOPRESSOR) 25 MG tablet Take 25 mg by mouth 2 (two) times daily.    Historical Provider, MD  pantoprazole (PROTONIX) 40 MG tablet Take 40 mg by mouth daily.    Historical Provider, MD  Pitavastatin Calcium (LIVALO) 2 MG TABS Take 0.5 tablets (1 mg total) by mouth daily. 01/10/13   Mihai Croitoru, MD  Polyethyl Glycol-Propyl Glycol (SYSTANE OP) Place 1 drop into both eyes daily as needed. For dry eyes    Historical Provider, MD  valsartan-hydrochlorothiazide (DIOVAN-HCT) 160-12.5 MG per tablet Take 1 tablet by mouth daily.    Historical Provider, MD   BP 120/73  Pulse 64  Temp(Src) 98.1 F (36.7 C) (Oral)  Resp 22  SpO2 99% Physical Exam  Nursing note and vitals reviewed. Constitutional: She is oriented to person, place, and time. She appears well-developed and well-nourished. No distress.  HENT:  Head: Normocephalic and atraumatic.  Right Ear: External ear normal.  Left Ear: External ear normal.  Nose: Nose normal.  Mouth/Throat: Oropharynx is clear and moist.  Eyes: Conjunctivae are normal.  Neck: Normal range of motion.  Cardiovascular: Normal rate, regular rhythm and normal heart sounds.   Pulmonary/Chest: Effort normal and breath sounds normal. No stridor. No respiratory distress. She has no wheezes. She has no rales.  Abdominal: Soft. She exhibits no distension.  Musculoskeletal: Normal range of motion.  Neurological: She is alert and oriented to person, place, and time. She has normal strength.  Skin: Skin is warm and dry. She is not diaphoretic. No erythema.  Psychiatric: She has a normal mood and affect. Her behavior is normal.    ED Course  Procedures (including critical care time) Labs Review Labs  Reviewed  COMPREHENSIVE METABOLIC PANEL - Abnormal; Notable for the following:    Sodium 136 (*)    Glucose, Bld 137 (*)    All other components within normal limits  URINALYSIS, ROUTINE W REFLEX MICROSCOPIC - Abnormal; Notable for the following:    APPearance CLOUDY (*)    All other components within normal limits  URINE CULTURE  CBC WITH DIFFERENTIAL  LIPASE, BLOOD  I-STAT TROPOININ, ED  POC URINE PREG, ED    Imaging Review No results found.   EKG Interpretation   Date/Time:  Friday Jun 22 2013 15:41:40 EDT Ventricular Rate:  80 PR Interval:  154 QRS Duration: 84 QT Interval:  378 QTC Calculation: 436 R Axis:   52 Text Interpretation:  Sinus rhythm Baseline wander in lead(s) II III aVF  Confirmed by Ashok Cordia  MD, Lennette Bihari ([id number]) on 06/22/2013 5:18:11 PM      MDM   Final diagnoses:  Epigastric pain    Patient is nontoxic, nonseptic appearing, in no apparent distress.  Patient's pain and other symptoms adequately managed in emergency department.  Fluid bolus given.  Labs and vitals reviewed.  Patient does not meet the SIRS or Sepsis criteria.  On repeat exam patient does not have a surgical abdomen and there are no peritoneal signs.  No indication of appendicitis, bowel obstruction, bowel perforation, cholecystitis, diverticulitis, PID or ectopic pregnancy. I do not believe this pain is cardiac in nature. Patient discharged home with symptomatic treatment and given strict instructions for follow-up with their primary care physician.  I have also discussed reasons to return immediately to the ER.  Patient expresses understanding and agrees with plan. Dr. Ashok Cordia evaluated patient and agrees with plan. Patient / Family / Caregiver informed of clinical course, understand medical decision-making process, and agree with plan.        Elwyn Lade, PA-C 06/22/13 1756

## 2013-06-22 NOTE — ED Notes (Addendum)
Initial contact- A&O x4. Initially had "gassy pains" starting last night. Does have hx GERD and takes protonix. C/o chest pain described as "sharp and heavy at times." Had CP that came and went starting last night and woke up this morning sweating and says "pillow was soaked." Was hospitalized last year for pancreatitis and had associated CP with this but reports "this is worse." CP is non-radiating per patient. Nausea this morning associated with CP without emesis. Does endorse light-headedness. Chills yesterday but is unsure if she had a fever. Has not been around anyone sick. No family hx of heart related issues. Hx HTN. Ambulatory with steady gait. Awaiting MD.

## 2013-06-22 NOTE — Discharge Instructions (Signed)
Abdominal Pain, Adult  Many things can cause belly (abdominal) pain. Most times, the belly pain is not dangerous. Many cases of belly pain can be watched and treated at home.  HOME CARE   · Do not take medicines that help you go poop (laxatives) unless told to by your doctor.  · Only take medicine as told by your doctor.  · Eat or drink as told by your doctor. Your doctor will tell you if you should be on a special diet.  GET HELP IF:  · You do not know what is causing your belly pain.  · You have belly pain while you are sick to your stomach (nauseous) or have runny poop (diarrhea).  · You have pain while you pee or poop.  · Your belly pain wakes you up at night.  · You have belly pain that gets worse or better when you eat.  · You have belly pain that gets worse when you eat fatty foods.  GET HELP RIGHT AWAY IF:   · The pain does not go away within 2 hours.  · You have a fever.  · You keep throwing up (vomiting).  · The pain changes and is only in the right or left part of the belly.  · You have bloody or tarry looking poop.  MAKE SURE YOU:   · Understand these instructions.  · Will watch your condition.  · Will get help right away if you are not doing well or get worse.  Document Released: 06/30/2007 Document Revised: 11/01/2012 Document Reviewed: 09/20/2012  Exit[employer]® Patient Information ©2014 [employer].

## 2013-06-23 ENCOUNTER — Encounter (HOSPITAL_COMMUNITY): Payer: Self-pay | Admitting: Emergency Medicine

## 2013-06-23 ENCOUNTER — Emergency Department (HOSPITAL_COMMUNITY)
Admission: EM | Admit: 2013-06-23 | Discharge: 2013-06-23 | Disposition: A | Discharge status: Home / Self Care | Payer: 59 | Attending: Emergency Medicine | Admitting: Emergency Medicine

## 2013-06-23 ENCOUNTER — Emergency Department (HOSPITAL_COMMUNITY): Payer: 59

## 2013-06-23 DIAGNOSIS — Z792 Long term (current) use of antibiotics: Secondary | ICD-10-CM | POA: Insufficient documentation

## 2013-06-23 DIAGNOSIS — Z90710 Acquired absence of both cervix and uterus: Secondary | ICD-10-CM | POA: Insufficient documentation

## 2013-06-23 DIAGNOSIS — E785 Hyperlipidemia, unspecified: Secondary | ICD-10-CM | POA: Insufficient documentation

## 2013-06-23 DIAGNOSIS — I10 Essential (primary) hypertension: Secondary | ICD-10-CM | POA: Insufficient documentation

## 2013-06-23 DIAGNOSIS — R7402 Elevation of levels of lactic acid dehydrogenase (LDH): Secondary | ICD-10-CM | POA: Insufficient documentation

## 2013-06-23 DIAGNOSIS — M129 Arthropathy, unspecified: Secondary | ICD-10-CM | POA: Insufficient documentation

## 2013-06-23 DIAGNOSIS — R109 Unspecified abdominal pain: Secondary | ICD-10-CM

## 2013-06-23 DIAGNOSIS — Z7982 Long term (current) use of aspirin: Secondary | ICD-10-CM | POA: Insufficient documentation

## 2013-06-23 DIAGNOSIS — R110 Nausea: Secondary | ICD-10-CM | POA: Insufficient documentation

## 2013-06-23 DIAGNOSIS — Z9889 Other specified postprocedural states: Secondary | ICD-10-CM | POA: Insufficient documentation

## 2013-06-23 DIAGNOSIS — E039 Hypothyroidism, unspecified: Secondary | ICD-10-CM | POA: Insufficient documentation

## 2013-06-23 DIAGNOSIS — E119 Type 2 diabetes mellitus without complications: Secondary | ICD-10-CM | POA: Insufficient documentation

## 2013-06-23 DIAGNOSIS — Z8742 Personal history of other diseases of the female genital tract: Secondary | ICD-10-CM | POA: Insufficient documentation

## 2013-06-23 DIAGNOSIS — F411 Generalized anxiety disorder: Secondary | ICD-10-CM | POA: Insufficient documentation

## 2013-06-23 DIAGNOSIS — R1013 Epigastric pain: Secondary | ICD-10-CM | POA: Insufficient documentation

## 2013-06-23 DIAGNOSIS — Z79899 Other long term (current) drug therapy: Secondary | ICD-10-CM | POA: Insufficient documentation

## 2013-06-23 DIAGNOSIS — Z87891 Personal history of nicotine dependence: Secondary | ICD-10-CM | POA: Insufficient documentation

## 2013-06-23 DIAGNOSIS — R7401 Elevation of levels of liver transaminase levels: Secondary | ICD-10-CM | POA: Insufficient documentation

## 2013-06-23 DIAGNOSIS — R740 Nonspecific elevation of levels of transaminase and lactic acid dehydrogenase [LDH]: Secondary | ICD-10-CM

## 2013-06-23 DIAGNOSIS — K219 Gastro-esophageal reflux disease without esophagitis: Secondary | ICD-10-CM | POA: Insufficient documentation

## 2013-06-23 DIAGNOSIS — R1011 Right upper quadrant pain: Secondary | ICD-10-CM | POA: Insufficient documentation

## 2013-06-23 DIAGNOSIS — Z9089 Acquired absence of other organs: Secondary | ICD-10-CM | POA: Insufficient documentation

## 2013-06-23 DIAGNOSIS — Z880 Allergy status to penicillin: Secondary | ICD-10-CM | POA: Insufficient documentation

## 2013-06-23 LAB — COMPREHENSIVE METABOLIC PANEL
ALK PHOS: 146 U/L — AB (ref 39–117)
ALT: 76 U/L — ABNORMAL HIGH (ref 0–35)
AST: 76 U/L — ABNORMAL HIGH (ref 0–37)
Albumin: 3.6 g/dL (ref 3.5–5.2)
BUN: 14 mg/dL (ref 6–23)
CO2: 27 mEq/L (ref 19–32)
CREATININE: 0.85 mg/dL (ref 0.50–1.10)
Calcium: 8.6 mg/dL (ref 8.4–10.5)
Chloride: 98 mEq/L (ref 96–112)
GFR calc Af Amer: 89 mL/min — ABNORMAL LOW (ref >90)
GFR calc non Af Amer: 77 mL/min — ABNORMAL LOW (ref >90)
Glucose, Bld: 108 mg/dL — ABNORMAL HIGH (ref 70–99)
POTASSIUM: 4.1 meq/L (ref 3.7–5.3)
Sodium: 137 mEq/L (ref 137–147)
TOTAL PROTEIN: 7.8 g/dL (ref 6.0–8.3)
Total Bilirubin: 0.4 mg/dL (ref 0.3–1.2)

## 2013-06-23 LAB — CBC
HEMATOCRIT: 39.9 % (ref 36.0–46.0)
Hemoglobin: 13 g/dL (ref 12.0–15.0)
MCH: 26.5 pg (ref 26.0–34.0)
MCHC: 32.6 g/dL (ref 30.0–36.0)
MCV: 81.4 fL (ref 78.0–100.0)
Platelets: 305 10*3/uL (ref 150–400)
RBC: 4.9 MIL/uL (ref 3.87–5.11)
RDW: 14.8 % (ref 11.5–15.5)
WBC: 7 10*3/uL (ref 4.0–10.5)

## 2013-06-23 LAB — LIPASE, BLOOD: LIPASE: 17 U/L (ref 11–59)

## 2013-06-23 MED ORDER — HYDROCODONE-ACETAMINOPHEN 5-325 MG PO TABS: 1.0000 | ORAL_TABLET | Freq: Four times a day (QID) | ORAL | Status: DC | PRN

## 2013-06-23 MED ORDER — GI COCKTAIL ~~LOC~~
30.0000 mL | Freq: Once | ORAL | Status: AC
  Administered 2013-06-23: 30 mL via ORAL
  Filled 2013-06-23: qty 30

## 2013-06-23 MED ORDER — IOHEXOL 300 MG/ML  SOLN
50.0000 mL | Freq: Once | INTRAMUSCULAR | Status: AC | PRN
  Administered 2013-06-23: 50 mL via ORAL

## 2013-06-23 MED ORDER — IOHEXOL 300 MG/ML  SOLN
100.0000 mL | Freq: Once | INTRAMUSCULAR | Status: AC | PRN
  Administered 2013-06-23: 100 mL via INTRAVENOUS

## 2013-06-23 MED ORDER — MORPHINE SULFATE 4 MG/ML IJ SOLN
6.0000 mg | Freq: Once | INTRAMUSCULAR | Status: AC
  Administered 2013-06-23: 6 mg via INTRAVENOUS
  Filled 2013-06-23: qty 2

## 2013-06-23 NOTE — ED Notes (Signed)
Patient transported to CT 

## 2013-06-23 NOTE — ED Provider Notes (Signed)
CSN: [id number]     Arrival date & time 06/23/13  2103 History   First MD Initiated Contact with Patient 06/23/13 2120     Chief Complaint  Patient presents with  . Abdominal Pain     (Consider location/radiation/quality/duration/timing/severity/associated sxs/prior Treatment) Patient is a 53 y.o. female presenting with abdominal pain. The history is provided by the patient.  Abdominal Pain Pain location:  Epigastric and RUQ Pain quality: aching and squeezing   Pain radiates to:  RUQ Pain severity:  Moderate Onset quality:  Gradual Duration:  2 days Timing:  Intermittent Progression:  Worsening Chronicity:  New Context: not alcohol use, not eating and not recent illness   Relieved by:  Nothing Worsened by:  Nothing tried Associated symptoms: nausea   Associated symptoms: no chills, no cough, no diarrhea, no fever, no shortness of breath and no vomiting     Past Medical History  Diagnosis Date  . BV (bacterial vaginosis)   . Hypertension   . Hyperlipidemia   . Arthritis   . Hypothyroidism 12/06/2011  . GERD (gastroesophageal reflux disease) 12/06/2011  . Anxiety 12/06/2011  . Diabetes mellitus without complication   . Morbid obesity with BMI of 50.0-59.9, adult   . Venous insufficiency of leg    Past Surgical History  Procedure Laterality Date  . Cholecystectomy    . Abdominal hysterectomy      uterine prolapse  . Cardiac catheterization  2007    NORMAL; catheter-induced spasm  . Nm myocar perf wall motion  01/20/2005    normal   Family History  Problem Relation Age of Onset  . Hypertension Mother   . Hypertension Father   . Hypertension Sister   . Kidney disease Sister     [facility] TRANSPLANT  . Thyroid disease Sister   . Breast cancer Maternal Aunt     >50; passed away from it  . Breast cancer Maternal Aunt     >50  . Thyroid disease Sister   . Hypertension Sister    History  Substance Use Topics  . Smoking status: Former Smoker    Quit date:  01/26/1991  . Smokeless tobacco: Never Used  . Alcohol Use: Yes     Comment: rarely   OB History   Grav Para Term Preterm Abortions TAB SAB Ect Mult Living   2 2        2      Review of Systems  Constitutional: Negative for fever and chills.  Respiratory: Negative for cough and shortness of breath.   Gastrointestinal: Positive for nausea and abdominal pain (RUQ, epigastric). Negative for vomiting and diarrhea.  All other systems reviewed and are negative.     Allergies  Metronidazole; Adhesive; Penicillins; and Vesicare  Home Medications   Prior to Admission medications   Medication Sig Start Date End Date Taking? Authorizing Provider  aspirin EC 81 MG tablet Take 81 mg by mouth every other day.    Yes Historical Provider, MD  cetirizine (ZYRTEC) 10 MG tablet Take 10 mg by mouth daily.   Yes Historical Provider, MD  clindamycin (CLEOCIN) 2 % vaginal cream Apply vaginally for 5 nights 10/19/12  Yes Huel Cote, NP  Fish Oil OIL Take 1 capsule by mouth daily.   Yes Historical Provider, MD  furosemide (LASIX) 20 MG tablet Take 1 tablet (20 mg total) by mouth daily. 05/10/13  Yes Leonie Man, MD  gabapentin (NEURONTIN) 300 MG capsule Take 300 mg by mouth 2 (two) times daily.  Yes Historical Provider, MD  HYDROcodone-acetaminophen (NORCO) 10-325 MG per tablet Take 1 tablet by mouth every 6 (six) hours as needed. For pain   Yes Historical Provider, MD  levothyroxine (SYNTHROID, LEVOTHROID) 75 MCG tablet Take 75 mcg by mouth daily before breakfast.   Yes Historical Provider, MD  meloxicam (MOBIC) 15 MG tablet Take 15 mg by mouth daily.   Yes Historical Provider, MD  metoprolol tartrate (LOPRESSOR) 25 MG tablet Take 25 mg by mouth 2 (two) times daily.   Yes Historical Provider, MD  mometasone (NASONEX) 50 MCG/ACT nasal spray Place 2 sprays into the nose daily as needed (allergies).   Yes Historical Provider, MD  pantoprazole (PROTONIX) 40 MG tablet Take 40 mg by mouth daily.   Yes  Historical Provider, MD  Pitavastatin Calcium (LIVALO) 2 MG TABS Take 0.5 tablets (1 mg total) by mouth daily. 01/10/13  Yes Mihai Croitoru, MD  Polyethyl Glycol-Propyl Glycol (SYSTANE OP) Place 1 drop into both eyes daily as needed (dry eyes). For dry eyes   Yes Historical Provider, MD  valsartan-hydrochlorothiazide (DIOVAN-HCT) 160-12.5 MG per tablet Take 1 tablet by mouth daily.   Yes Historical Provider, MD   BP 119/72  Pulse 65  Temp(Src) 98.1 F (36.7 C) (Oral)  Resp 20  SpO2 98% Physical Exam  Nursing note and vitals reviewed. Constitutional: She is oriented to person, place, and time. She appears well-developed and well-nourished. No distress.  HENT:  Head: Normocephalic and atraumatic.  Mouth/Throat: Oropharynx is clear and moist. No oropharyngeal exudate.  Eyes: EOM are normal. Pupils are equal, round, and reactive to light.  Neck: Normal range of motion. Neck supple.  Cardiovascular: Normal rate and regular rhythm.  Exam reveals no friction rub.   No murmur heard. Pulmonary/Chest: Effort normal and breath sounds normal. No respiratory distress. She has no wheezes. She has no rales.  Abdominal: Soft. She exhibits no distension. There is no tenderness. There is no rebound.  Musculoskeletal: Normal range of motion. She exhibits no edema.  Neurological: She is alert and oriented to person, place, and time. No cranial nerve deficit. She exhibits normal muscle tone.  Skin: No rash noted. She is not diaphoretic.    ED Course  Procedures (including critical care time) Labs Review Labs Reviewed  COMPREHENSIVE METABOLIC PANEL - Abnormal; Notable for the following:    Glucose, Bld 108 (*)    AST 76 (*)    ALT 76 (*)    Alkaline Phosphatase 146 (*)    GFR calc non Af Amer 77 (*)    GFR calc Af Amer 89 (*)    All other components within normal limits  CBC  LIPASE, BLOOD    Imaging Review Ct Abdomen Pelvis W Contrast  06/23/2013   CLINICAL DATA:  Right upper quadrant pain.  History of cholecystectomy  EXAM: CT ABDOMEN AND PELVIS WITH CONTRAST  TECHNIQUE: Multidetector CT imaging of the abdomen and pelvis was performed using the standard protocol following bolus administration of intravenous contrast.  CONTRAST:  145mL OMNIPAQUE IOHEXOL 300 MG/ML  SOLN  COMPARISON:  12/07/2011  FINDINGS: LOWER CHEST: Unremarkable.  ABDOMEN/PELVIS:  Liver: No focal abnormality.  Biliary: Cholecystectomy.  No biliary dilatation.  Pancreas: Unremarkable.  Spleen: Unremarkable.  Adrenals: Unremarkable.  Kidneys and ureters: No hydronephrosis or stone.  Bladder: Low positioning, otherwise unremarkable.  Reproductive: Hysterectomy. Low pelvic floor consistent with laxity.  Bowel: No obstruction. Negative appendix. Redundant colon with high positioning of the cecum.  Retroperitoneum: No mass or adenopathy.  Peritoneum: No  free fluid or gas.  Vascular: No acute abnormality.  OSSEOUS: Stable appearance of sclerosis around the SI joints and symphysis pubis. L4-5 posterior lumbar interbody fusion. Rod and pedicle screws extend from L3-L5. No adverse hardware features. 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  IMPRESSION: No acute intra-abdominal abnormality.   Electronically Signed   By: Jorje Guild M.D.   On: 06/23/2013 23:14     EKG Interpretation   Date/Time:  Saturday Jun 23 2013 21:47:14 EDT Ventricular Rate:  58 PR Interval:  155 QRS Duration: 81 QT Interval:  427 QTC Calculation: 419 R Axis:   65 Text Interpretation:  Sinus rhythm Abnormal R-wave progression, early  transition Minimal ST elevation, inferior leads Baseline wander in lead(s)  V2 Simlar to prior Confirmed by Tivoli ([id number]) on 06/23/2013  11:16:39 PM      MDM   Final diagnoses:  Abdominal pain  Transaminitis    76F presents with RUQ pain. Seen here yesterday for same, labs ok. Pain worsening, radiating to RUQ. Hx of cholecystectomy. Pain intermittent,  squeezing, concerning for biliary colic type pain. On exam, RUQ pain. With hx of cholecystectomy and obesity, will CT her abdomen.  CT shows no dilated CBD, normal. Mild transaminitis on her labs. Given pain meds, instructed to f/u with PCP.  Osvaldo Shipper, MD 06/24/13 [phone]

## 2013-06-23 NOTE — Discharge Instructions (Signed)
Abdominal Pain, Women °Abdominal (stomach, pelvic, or belly) pain can be caused by many things. It is important to tell your doctor: °· The location of the pain. °· Does it come and go or is it present all the time? °· Are there things that start the pain (eating certain foods, exercise)? °· Are there other symptoms associated with the pain (fever, nausea, vomiting, diarrhea)? °All of this is helpful to know when trying to find the cause of the pain. °CAUSES  °· Stomach: virus or bacteria infection, or ulcer. °· Intestine: appendicitis (inflamed appendix), regional ileitis (Crohn's disease), ulcerative colitis (inflamed colon), irritable bowel syndrome, diverticulitis (inflamed diverticulum of the colon), or cancer of the stomach or intestine. °· Gallbladder disease or stones in the gallbladder. °· Kidney disease, kidney stones, or infection. °· Pancreas infection or cancer. °· Fibromyalgia (pain disorder). °· Diseases of the female organs: °· Uterus: fibroid (non-cancerous) tumors or infection. °· Fallopian tubes: infection or tubal pregnancy. °· Ovary: cysts or tumors. °· Pelvic adhesions (scar tissue). °· Endometriosis (uterus lining tissue growing in the pelvis and on the pelvic organs). °· Pelvic congestion syndrome (female organs filling up with blood just before the menstrual period). °· Pain with the menstrual period. °· Pain with ovulation (producing an egg). °· Pain with an IUD (intrauterine device, birth control) in the uterus. °· Cancer of the female organs. °· Functional pain (pain not caused by a disease, may improve without treatment). °· Psychological pain. °· Depression. °DIAGNOSIS  °Your doctor will decide the seriousness of your pain by doing an examination. °· Blood tests. °· X-rays. °· Ultrasound. °· CT scan (computed tomography, special type of X-ray). °· MRI (magnetic resonance imaging). °· Cultures, for infection. °· Barium enema (dye inserted in the large intestine, to better view it with  X-rays). °· Colonoscopy (looking in intestine with a lighted tube). °· Laparoscopy (minor surgery, looking in abdomen with a lighted tube). °· Major abdominal exploratory surgery (looking in abdomen with a large incision). °TREATMENT  °The treatment will depend on the cause of the pain.  °· Many cases can be observed and treated at home. °· Over-the-counter medicines recommended by your caregiver. °· Prescription medicine. °· Antibiotics, for infection. °· Birth control pills, for painful periods or for ovulation pain. °· Hormone treatment, for endometriosis. °· Nerve blocking injections. °· Physical therapy. °· Antidepressants. °· Counseling with a psychologist or psychiatrist. °· Minor or major surgery. °HOME CARE INSTRUCTIONS  °· Do not take laxatives, unless directed by your caregiver. °· Take over-the-counter pain medicine only if ordered by your caregiver. Do not take aspirin because it can cause an upset stomach or bleeding. °· Try a clear liquid diet (broth or water) as ordered by your caregiver. Slowly move to a bland diet, as tolerated, if the pain is related to the stomach or intestine. °· Have a thermometer and take your temperature several times a day, and record it. °· Bed rest and sleep, if it helps the pain. °· Avoid sexual intercourse, if it causes pain. °· Avoid stressful situations. °· Keep your follow-up appointments and tests, as your caregiver orders. °· If the pain does not go away with medicine or surgery, you may try: °· Acupuncture. °· Relaxation exercises (yoga, meditation). °· Group therapy. °· Counseling. °SEEK MEDICAL CARE IF:  °· You notice certain foods cause stomach pain. °· Your home care treatment is not helping your pain. °· You need stronger pain medicine. °· You want your IUD removed. °· You feel faint or   lightheaded. °· You develop nausea and vomiting. °· You develop a rash. °· You are having side effects or an allergy to your medicine. °SEEK IMMEDIATE MEDICAL CARE IF:  °· Your  pain does not go away or gets worse. °· You have a fever. °· Your pain is felt only in portions of the abdomen. The right side could possibly be appendicitis. The left lower portion of the abdomen could be colitis or diverticulitis. °· You are passing blood in your stools (bright red or black tarry stools, with or without vomiting). °· You have blood in your urine. °· You develop chills, with or without a fever. °· You pass out. °MAKE SURE YOU:  °· Understand these instructions. °· Will watch your condition. °· Will get help right away if you are not doing well or get worse. °Document Released: 11/08/2006 Document Revised: 04/05/2011 Document Reviewed: 11/28/2008 °Exit[employer]® Patient Information ©2014 [employer]. ° °

## 2013-06-23 NOTE — ED Notes (Signed)
Pt c/o upper abd pain. Seen yesterday for same. No clear Dx. Told to come back if it worsened. Now is radiating to RUQ. Feels "gassy, bloated."

## 2013-06-24 LAB — URINE CULTURE: Colony Count: 40000

## 2013-08-08 ENCOUNTER — Encounter (HOSPITAL_COMMUNITY): Payer: Self-pay | Admitting: Pharmacy Technician

## 2013-08-09 NOTE — Pre-Procedure Instructions (Signed)
[name]  08/09/2013   Your procedure is scheduled on:  Thurs, July 30 @ 7:30 AM  Report to [facility]  at 5:30 AM.  Call this number if you have problems the morning of surgery: [phone]   Remember:   Do not eat food or drink liquids after midnight.   Take these medicines the morning of surgery with A SIP OF WATER: Zyrtec(Cetirizine),Gabapentin(Neurontin),Pain Pill(if needed),Synthroid(Levothyroxine),Metoprolol(Lopressor),Eye Drops,and Nasonex(Mometasone)              Stop taking your Aspirin,Fish Oil,and Mobic. No Goody's,BC's,Aleve,Ibuprofen,or any Herbal Medications    Do not wear jewelry, make-up or nail polish.  Do not wear lotions, powders, or perfumes.   Do not shave 48 hours prior to surgery.   Do not bring valuables to the hospital.  [facility] is not responsible                  for any belongings or valuables.               Contacts, dentures or bridgework may not be worn into surgery.  Leave suitcase in the car. After surgery it may be brought to your room.  For patients admitted to the hospital, discharge time is determined by your                treatment team.               Patients discharged the day of surgery will not be allowed to drive  home.    Special Instructions:  [place] - Preparing for Surgery  Before surgery, you can play an important role.  Because skin is not sterile, your skin needs to be as free of germs as possible.  You can reduce the number of germs on you skin by washing with CHG (chlorahexidine gluconate) soap before surgery.  CHG is an antiseptic cleaner which kills germs and bonds with the skin to continue killing germs even after washing.  Please DO NOT use if you have an allergy to CHG or antibacterial soaps.  If your skin becomes reddened/irritated stop using the CHG and inform your nurse when you arrive at Short Stay.  Do not shave (including legs and underarms) for at least 48 hours prior to the first CHG shower.   You may shave your face.  Please follow these instructions carefully:   1.  Shower with CHG Soap the night before surgery and the                                morning of Surgery.  2.  If you choose to wash your hair, wash your hair first as usual with your       normal shampoo.  3.  After you shampoo, rinse your hair and body thoroughly to remove the                      Shampoo.  4.  Use CHG as you would any other liquid soap.  You can apply chg directly       to the skin and wash gently with scrungie or a clean washcloth.  5.  Apply the CHG Soap to your body ONLY FROM THE NECK DOWN.        Do not use on open wounds or open sores.  Avoid contact with your eyes,       ears,  mouth and genitals (private parts).  Wash genitals (private parts)       with your normal soap.  6.  Wash thoroughly, paying special attention to the area where your surgery        will be performed.  7.  Thoroughly rinse your body with warm water from the neck down.  8.  DO NOT shower/wash with your normal soap after using and rinsing off       the CHG Soap.  9.  Pat yourself dry with a clean towel.            10.  Wear clean pajamas.            11.  Place clean sheets on your bed the night of your first shower and do not        sleep with pets.  Day of Surgery  Do not apply any lotions/deoderants the morning of surgery.  Please wear clean clothes to the hospital/surgery center.     Please read over the following fact sheets that you were given: Pain Booklet, Coughing and Deep Breathing and Surgical Site Infection Prevention

## 2013-08-10 ENCOUNTER — Encounter (HOSPITAL_COMMUNITY)
Admission: RE | Admit: 2013-08-10 | Discharge: 2013-08-10 | Disposition: A | Discharge status: Home / Self Care | Payer: 59 | Source: Ambulatory Visit | Attending: Orthopedic Surgery | Admitting: Orthopedic Surgery

## 2013-08-10 ENCOUNTER — Encounter (HOSPITAL_COMMUNITY): Payer: Self-pay

## 2013-08-10 ENCOUNTER — Ambulatory Visit (HOSPITAL_COMMUNITY)
Admission: RE | Admit: 2013-08-10 | Discharge: 2013-08-10 | Disposition: A | Discharge status: Home / Self Care | Payer: 59 | Source: Ambulatory Visit | Attending: Anesthesiology | Admitting: Anesthesiology

## 2013-08-10 DIAGNOSIS — Z01818 Encounter for other preprocedural examination: Secondary | ICD-10-CM | POA: Insufficient documentation

## 2013-08-10 DIAGNOSIS — F17200 Nicotine dependence, unspecified, uncomplicated: Secondary | ICD-10-CM | POA: Insufficient documentation

## 2013-08-10 DIAGNOSIS — I10 Essential (primary) hypertension: Secondary | ICD-10-CM | POA: Insufficient documentation

## 2013-08-10 HISTORY — DX: Headache: R51

## 2013-08-10 HISTORY — DX: Constipation, unspecified: K59.00

## 2013-08-10 HISTORY — DX: Frequency of micturition: R35.0

## 2013-08-10 HISTORY — DX: Edema, unspecified: R60.9

## 2013-08-10 HISTORY — DX: Nocturia: R35.1

## 2013-08-10 HISTORY — DX: Personal history of other diseases of the respiratory system: Z87.09

## 2013-08-10 HISTORY — DX: Anemia, unspecified: D64.9

## 2013-08-10 HISTORY — DX: Dry eye syndrome of bilateral lacrimal glands: H04.123

## 2013-08-10 HISTORY — DX: Nasal congestion: R09.81

## 2013-08-10 HISTORY — DX: Anesthesia of skin: R20.0

## 2013-08-10 HISTORY — DX: Localized edema: R60.0

## 2013-08-10 HISTORY — DX: Pain in unspecified joint: M25.50

## 2013-08-10 HISTORY — DX: Allergy status to unspecified drugs, medicaments and biological substances: Z88.9

## 2013-08-10 LAB — CBC WITH DIFFERENTIAL/PLATELET
BASOS PCT: 1 % (ref 0–1)
Basophils Absolute: 0.1 10*3/uL (ref 0.0–0.1)
EOS ABS: 0.3 10*3/uL (ref 0.0–0.7)
Eosinophils Relative: 6 % — ABNORMAL HIGH (ref 0–5)
HCT: 40.3 % (ref 36.0–46.0)
Hemoglobin: 13.1 g/dL (ref 12.0–15.0)
Lymphocytes Relative: 44 % (ref 12–46)
Lymphs Abs: 2.3 10*3/uL (ref 0.7–4.0)
MCH: 27.1 pg (ref 26.0–34.0)
MCHC: 32.5 g/dL (ref 30.0–36.0)
MCV: 83.3 fL (ref 78.0–100.0)
MONO ABS: 0.5 10*3/uL (ref 0.1–1.0)
Monocytes Relative: 10 % (ref 3–12)
NEUTROS ABS: 2 10*3/uL (ref 1.7–7.7)
Neutrophils Relative %: 39 % — ABNORMAL LOW (ref 43–77)
Platelets: 302 10*3/uL (ref 150–400)
RBC: 4.84 MIL/uL (ref 3.87–5.11)
RDW: 14.4 % (ref 11.5–15.5)
WBC: 5.1 10*3/uL (ref 4.0–10.5)

## 2013-08-10 LAB — COMPREHENSIVE METABOLIC PANEL
ALBUMIN: 3.6 g/dL (ref 3.5–5.2)
ALK PHOS: 96 U/L (ref 39–117)
ALT: 24 U/L (ref 0–35)
ANION GAP: 11 (ref 5–15)
AST: 25 U/L (ref 0–37)
BUN: 11 mg/dL (ref 6–23)
CHLORIDE: 101 meq/L (ref 96–112)
CO2: 31 mEq/L (ref 19–32)
CREATININE: 0.73 mg/dL (ref 0.50–1.10)
Calcium: 9.3 mg/dL (ref 8.4–10.5)
Glucose, Bld: 103 mg/dL — ABNORMAL HIGH (ref 70–99)
Potassium: 3.5 mEq/L — ABNORMAL LOW (ref 3.7–5.3)
Sodium: 143 mEq/L (ref 137–147)
TOTAL PROTEIN: 8 g/dL (ref 6.0–8.3)
Total Bilirubin: 0.3 mg/dL (ref 0.3–1.2)

## 2013-08-10 LAB — PROTIME-INR
INR: 1.12 (ref 0.00–1.49)
Prothrombin Time: 14.4 seconds (ref 11.6–15.2)

## 2013-08-10 LAB — APTT: APTT: 29 s (ref 24–37)

## 2013-08-10 MED ORDER — CHLORHEXIDINE GLUCONATE 4 % EX LIQD
60.0000 mL | Freq: Once | CUTANEOUS | Status: DC
Start: 2013-08-10 — End: 2013-08-11

## 2013-08-10 NOTE — Progress Notes (Signed)
08/10/13 0831  OBSTRUCTIVE SLEEP APNEA  Have you ever been diagnosed with sleep apnea through a sleep study? No  Do you snore loudly (loud enough to be heard through closed doors)?  1  Do you often feel tired, fatigued, or sleepy during the daytime? 1  Has anyone observed you stop breathing during your sleep? 0  Do you have, or are you being treated for high blood pressure? 1  BMI more than 35 kg/m2? 1  Age over 53 years old? 1  Neck circumference greater than 40 cm/16 inches? 0  Gender: 0  Obstructive Sleep Apnea Score 5  Score 4 or greater  Results sent to PCP

## 2013-08-10 NOTE — Progress Notes (Addendum)
Cardiologist is Dr.C with last visit a few months ago  Echo report in epic from 2015  Stress test in epic from 2006  EKG in epic from 06-23-13  Medical Md is Dr.Scott Holwerda  Heart cath done in 2006

## 2013-08-21 ENCOUNTER — Ambulatory Visit (INDEPENDENT_AMBULATORY_CARE_PROVIDER_SITE_OTHER): Payer: 59

## 2013-08-21 ENCOUNTER — Encounter: Payer: Self-pay | Admitting: Podiatry

## 2013-08-21 ENCOUNTER — Ambulatory Visit (INDEPENDENT_AMBULATORY_CARE_PROVIDER_SITE_OTHER): Payer: 59 | Admitting: Podiatry

## 2013-08-21 VITALS — BP 107/67 | HR 67 | Resp 16 | Ht 63.0 in | Wt 277.0 lb

## 2013-08-21 DIAGNOSIS — M6740 Ganglion, unspecified site: Secondary | ICD-10-CM

## 2013-08-21 DIAGNOSIS — M19079 Primary osteoarthritis, unspecified ankle and foot: Secondary | ICD-10-CM

## 2013-08-21 DIAGNOSIS — L600 Ingrowing nail: Secondary | ICD-10-CM

## 2013-08-21 DIAGNOSIS — M19072 Primary osteoarthritis, left ankle and foot: Secondary | ICD-10-CM

## 2013-08-21 MED ORDER — NEOMYCIN-POLYMYXIN-HC 3.5-10000-1 OT SOLN: OTIC | Status: DC

## 2013-08-21 NOTE — Progress Notes (Signed)
   Subjective:    Patient ID: [name], female    DOB: [birth year], 53 y.o.   MRN: [id number]  HPI Comments: i have an ingrown toenail on the left great toe, medial corner, i also have foot pain on the left foot. i have venous insufficiency .  Top of the foot i get a shooting pain that goes along the side of the foot as well,   Foot Pain      Review of Systems  HENT:       Sinus problems   Musculoskeletal: Positive for back pain.       Joint pain   All other systems reviewed and are negative.      Objective:   Physical Exam: I have reviewed her past medical history medications allergies surgeries social history and review of systems. Pulses are strongly palpable bilateral. Neurologic sensorium is intact per Semmes-Weinstein monofilament. Deep tendon reflexes are intact bilateral and muscle strength is 5 over 5 dorsiflexors plantar flexors inverters everters all intrinsic musculature is intact. Orthopedic evaluation demonstrates large nonpulsatile mass to the dorsal aspect of the second metatarsal and intermediate cuneiform articulation. This is mildly tender on palpation and with attempt to move the joint does appear to be painful. Otherwise all joints distal to the ankle a full range of motion without crepitation. Cutaneous evaluation demonstrates sharp incurvated nail margin along the tibial border of the hallux left. Erythema and edema is present. Radiographic evaluation does demonstrate osteoarthritis second metatarsal intermediate cuneiform joint left foot. Dorsal spurring is also noted.        Assessment & Plan:  Assessment: Osteoarthritis dorsal aspect left foot. Ingrown nail paronychia abscess hallux left.  Plan: Discussed etiology pathology conservative versus surgical therapies. Discussed surgical correction for the dorsal aspect of the left foot. Also discussed skirt surgical correction for the tibial border of the hallux left. This was performed today after local  anesthetic and administered in a digital block about the left hallux. The toe is prepped and draped in sterile sterile fashion the tibial margin was split from proximal distal avulsed and the nail from the bed and the root. 3 applications of phenol were applied to the bed and root. Neutralized last couple alcohol. A dry sterile compressive dressing was applied she was given both oral and written home-going instructions for the care of her toe as well as a prescription for Cortisporin Otic. I will followup with her in 2 weeks when she has her rotator cuff surgery.

## 2013-08-21 NOTE — Patient Instructions (Signed)

## 2013-08-22 MED ORDER — DEXTROSE 5 % IV SOLN
3.0000 g | INTRAVENOUS | Status: AC
  Administered 2013-08-23: 3 g via INTRAVENOUS
  Filled 2013-08-22: qty 3000

## 2013-08-22 NOTE — Progress Notes (Signed)
Notified pt. Of time change. Instructed to arrive at 1100

## 2013-08-23 ENCOUNTER — Encounter (HOSPITAL_COMMUNITY)
Admission: RE | Disposition: A | Discharge status: Home / Self Care | Payer: Self-pay | Source: Ambulatory Visit | Attending: Orthopedic Surgery

## 2013-08-23 ENCOUNTER — Ambulatory Visit (HOSPITAL_COMMUNITY)
Admission: RE | Admit: 2013-08-23 | Discharge: 2013-08-23 | Disposition: A | Discharge status: Home / Self Care | Payer: 59 | Source: Ambulatory Visit | Attending: Orthopedic Surgery | Admitting: Orthopedic Surgery

## 2013-08-23 ENCOUNTER — Encounter (HOSPITAL_COMMUNITY): Payer: 59 | Admitting: Anesthesiology

## 2013-08-23 ENCOUNTER — Ambulatory Visit (HOSPITAL_COMMUNITY): Payer: 59 | Admitting: Anesthesiology

## 2013-08-23 ENCOUNTER — Encounter (HOSPITAL_COMMUNITY): Payer: Self-pay | Admitting: *Deleted

## 2013-08-23 DIAGNOSIS — Z79899 Other long term (current) drug therapy: Secondary | ICD-10-CM | POA: Diagnosis not present

## 2013-08-23 DIAGNOSIS — H04129 Dry eye syndrome of unspecified lacrimal gland: Secondary | ICD-10-CM | POA: Diagnosis not present

## 2013-08-23 DIAGNOSIS — M24119 Other articular cartilage disorders, unspecified shoulder: Secondary | ICD-10-CM | POA: Insufficient documentation

## 2013-08-23 DIAGNOSIS — M7580 Other shoulder lesions, unspecified shoulder: Principal | ICD-10-CM

## 2013-08-23 DIAGNOSIS — Z880 Allergy status to penicillin: Secondary | ICD-10-CM | POA: Diagnosis not present

## 2013-08-23 DIAGNOSIS — I10 Essential (primary) hypertension: Secondary | ICD-10-CM | POA: Insufficient documentation

## 2013-08-23 DIAGNOSIS — K5900 Constipation, unspecified: Secondary | ICD-10-CM | POA: Diagnosis not present

## 2013-08-23 DIAGNOSIS — E039 Hypothyroidism, unspecified: Secondary | ICD-10-CM | POA: Insufficient documentation

## 2013-08-23 DIAGNOSIS — M19019 Primary osteoarthritis, unspecified shoulder: Secondary | ICD-10-CM | POA: Diagnosis not present

## 2013-08-23 DIAGNOSIS — K219 Gastro-esophageal reflux disease without esophagitis: Secondary | ICD-10-CM | POA: Insufficient documentation

## 2013-08-23 DIAGNOSIS — R609 Edema, unspecified: Secondary | ICD-10-CM | POA: Insufficient documentation

## 2013-08-23 DIAGNOSIS — M25819 Other specified joint disorders, unspecified shoulder: Secondary | ICD-10-CM | POA: Diagnosis present

## 2013-08-23 DIAGNOSIS — M67919 Unspecified disorder of synovium and tendon, unspecified shoulder: Secondary | ICD-10-CM | POA: Diagnosis not present

## 2013-08-23 DIAGNOSIS — Z7982 Long term (current) use of aspirin: Secondary | ICD-10-CM | POA: Diagnosis not present

## 2013-08-23 DIAGNOSIS — M719 Bursopathy, unspecified: Secondary | ICD-10-CM | POA: Insufficient documentation

## 2013-08-23 DIAGNOSIS — E119 Type 2 diabetes mellitus without complications: Secondary | ICD-10-CM | POA: Diagnosis not present

## 2013-08-23 DIAGNOSIS — Z888 Allergy status to other drugs, medicaments and biological substances status: Secondary | ICD-10-CM | POA: Diagnosis not present

## 2013-08-23 DIAGNOSIS — Z87891 Personal history of nicotine dependence: Secondary | ICD-10-CM | POA: Insufficient documentation

## 2013-08-23 HISTORY — PX: SHOULDER ARTHROSCOPY WITH ROTATOR CUFF REPAIR AND SUBACROMIAL DECOMPRESSION: SHX5686

## 2013-08-23 LAB — GLUCOSE, CAPILLARY
GLUCOSE-CAPILLARY: 117 mg/dL — AB (ref 70–99)
GLUCOSE-CAPILLARY: 91 mg/dL (ref 70–99)

## 2013-08-23 SURGERY — SHOULDER ARTHROSCOPY WITH ROTATOR CUFF REPAIR AND SUBACROMIAL DECOMPRESSION
Anesthesia: Regional | Site: Shoulder | Laterality: Right

## 2013-08-23 MED ORDER — DIAZEPAM 5 MG PO TABS: 2.5000 mg | ORAL_TABLET | Freq: Four times a day (QID) | ORAL | Status: DC | PRN

## 2013-08-23 MED ORDER — FENTANYL CITRATE 0.05 MG/ML IJ SOLN
INTRAMUSCULAR | Status: DC | PRN
  Administered 2013-08-23: 50 ug via INTRAVENOUS

## 2013-08-23 MED ORDER — ONDANSETRON HCL 4 MG/2ML IJ SOLN
INTRAMUSCULAR | Status: AC
  Filled 2013-08-23: qty 2

## 2013-08-23 MED ORDER — ONDANSETRON HCL 4 MG/2ML IJ SOLN
INTRAMUSCULAR | Status: DC | PRN
  Administered 2013-08-23: 4 mg via INTRAVENOUS

## 2013-08-23 MED ORDER — GLYCOPYRROLATE 0.2 MG/ML IJ SOLN
INTRAMUSCULAR | Status: AC
  Filled 2013-08-23: qty 1

## 2013-08-23 MED ORDER — OXYCODONE-ACETAMINOPHEN 5-325 MG PO TABS: 1.0000 | ORAL_TABLET | ORAL | Status: DC | PRN

## 2013-08-23 MED ORDER — GLYCOPYRROLATE 0.2 MG/ML IJ SOLN
INTRAMUSCULAR | Status: AC
  Filled 2013-08-23: qty 2

## 2013-08-23 MED ORDER — ONDANSETRON HCL 4 MG PO TABS: 4.0000 mg | ORAL_TABLET | Freq: Three times a day (TID) | ORAL | Status: DC | PRN

## 2013-08-23 MED ORDER — FENTANYL CITRATE 0.05 MG/ML IJ SOLN
INTRAMUSCULAR | Status: AC
  Administered 2013-08-23: 100 ug via INTRAVENOUS
  Filled 2013-08-23: qty 2

## 2013-08-23 MED ORDER — LACTATED RINGERS IV SOLN
INTRAVENOUS | Status: DC
  Administered 2013-08-23 (×2): via INTRAVENOUS

## 2013-08-23 MED ORDER — MIDAZOLAM HCL 2 MG/2ML IJ SOLN
2.0000 mg | Freq: Once | INTRAMUSCULAR | Status: AC
  Administered 2013-08-23: 2 mg via INTRAVENOUS

## 2013-08-23 MED ORDER — LIDOCAINE HCL (CARDIAC) 20 MG/ML IV SOLN
INTRAVENOUS | Status: DC | PRN
  Administered 2013-08-23: 40 mg via INTRAVENOUS

## 2013-08-23 MED ORDER — PHENYLEPHRINE 40 MCG/ML (10ML) SYRINGE FOR IV PUSH (FOR BLOOD PRESSURE SUPPORT)
PREFILLED_SYRINGE | INTRAVENOUS | Status: AC
  Filled 2013-08-23: qty 20

## 2013-08-23 MED ORDER — PROPOFOL 10 MG/ML IV BOLUS
INTRAVENOUS | Status: AC
  Filled 2013-08-23: qty 20

## 2013-08-23 MED ORDER — MIDAZOLAM HCL 2 MG/2ML IJ SOLN
INTRAMUSCULAR | Status: AC
  Administered 2013-08-23: 2 mg via INTRAVENOUS
  Filled 2013-08-23: qty 2

## 2013-08-23 MED ORDER — SODIUM CHLORIDE 0.9 % IV SOLN
10.0000 mg | INTRAVENOUS | Status: DC | PRN
  Administered 2013-08-23: 15 ug/min via INTRAVENOUS

## 2013-08-23 MED ORDER — NEOSTIGMINE METHYLSULFATE 10 MG/10ML IV SOLN
INTRAVENOUS | Status: DC | PRN
  Administered 2013-08-23: 4 mg via INTRAVENOUS

## 2013-08-23 MED ORDER — PHENYLEPHRINE HCL 10 MG/ML IJ SOLN
INTRAMUSCULAR | Status: DC | PRN
  Administered 2013-08-23 (×2): 120 ug via INTRAVENOUS
  Administered 2013-08-23 (×3): 80 ug via INTRAVENOUS

## 2013-08-23 MED ORDER — NEOSTIGMINE METHYLSULFATE 10 MG/10ML IV SOLN
INTRAVENOUS | Status: AC
  Filled 2013-08-23: qty 1

## 2013-08-23 MED ORDER — ROCURONIUM BROMIDE 100 MG/10ML IV SOLN
INTRAVENOUS | Status: DC | PRN
  Administered 2013-08-23: 50 mg via INTRAVENOUS

## 2013-08-23 MED ORDER — GLYCOPYRROLATE 0.2 MG/ML IJ SOLN
INTRAMUSCULAR | Status: DC | PRN
  Administered 2013-08-23: 0.6 mg via INTRAVENOUS

## 2013-08-23 MED ORDER — EPHEDRINE SULFATE 50 MG/ML IJ SOLN
INTRAMUSCULAR | Status: DC | PRN
  Administered 2013-08-23 (×3): 10 mg via INTRAVENOUS

## 2013-08-23 MED ORDER — ROPIVACAINE HCL 5 MG/ML IJ SOLN
INTRAMUSCULAR | Status: DC | PRN
  Administered 2013-08-23: 30 mL via PERINEURAL

## 2013-08-23 MED ORDER — PHENYLEPHRINE HCL 10 MG/ML IJ SOLN
INTRAMUSCULAR | Status: AC
  Filled 2013-08-23: qty 1

## 2013-08-23 MED ORDER — FENTANYL CITRATE 0.05 MG/ML IJ SOLN
INTRAMUSCULAR | Status: AC
  Filled 2013-08-23: qty 5

## 2013-08-23 MED ORDER — FENTANYL CITRATE 0.05 MG/ML IJ SOLN
100.0000 ug | Freq: Once | INTRAMUSCULAR | Status: AC
  Administered 2013-08-23: 100 ug via INTRAVENOUS

## 2013-08-23 MED ORDER — PROPOFOL 10 MG/ML IV BOLUS
INTRAVENOUS | Status: DC | PRN
  Administered 2013-08-23: 130 mg via INTRAVENOUS

## 2013-08-23 MED ORDER — LIDOCAINE HCL (CARDIAC) 20 MG/ML IV SOLN
INTRAVENOUS | Status: AC
  Filled 2013-08-23: qty 5

## 2013-08-23 MED ORDER — SODIUM CHLORIDE 0.9 % IR SOLN
Status: DC | PRN
  Administered 2013-08-23: 3000 mL

## 2013-08-23 SURGICAL SUPPLY — 64 items
ANCH SUT 2 CRKSRW FT 14X4.5 (Anchor) ×1 IMPLANT
ANCH SUT SWLK 19.1X4.75 VT (Anchor) ×2 IMPLANT
ANCHOR PEEK 4.75X19.1 SWLK C (Anchor) ×2 IMPLANT
ANCHOR PEEK CORKSCREW 4.5 (Anchor) ×1 IMPLANT
BLADE CUTTER GATOR 3.5 (BLADE) ×2 IMPLANT
BLADE GREAT WHITE 4.2 (BLADE) ×2 IMPLANT
BLADE SURG 11 STRL SS (BLADE) ×2 IMPLANT
BOOTCOVER CLEANROOM LRG (PROTECTIVE WEAR) ×4 IMPLANT
BUR 3.5 LG SPHERICAL (BURR) IMPLANT
BUR OVAL 4.0 (BURR) ×2 IMPLANT
BURR 3.5 LG SPHERICAL (BURR)
CANISTER SUCT LVC 12 LTR MEDI- (MISCELLANEOUS) ×2 IMPLANT
CANNULA ACUFLEX KIT 5X76 (CANNULA) ×2 IMPLANT
CANNULA DRILOCK 5.0X75 (CANNULA) ×1 IMPLANT
CANNULA TWIST IN 6X9CM RED TRO (CANNULA) ×2 IMPLANT
CONNECTOR 5 IN 1 STRAIGHT STRL (MISCELLANEOUS) ×2 IMPLANT
DRAPE INCISE 23X17 IOBAN STRL (DRAPES) ×1
DRAPE INCISE 23X17 STRL (DRAPES) ×1 IMPLANT
DRAPE INCISE IOBAN 23X17 STRL (DRAPES) ×1 IMPLANT
DRAPE INCISE IOBAN 66X45 STRL (DRAPES) ×2 IMPLANT
DRAPE STERI 35X30 U-POUCH (DRAPES) ×2 IMPLANT
DRAPE SURG 17X11 SM STRL (DRAPES) ×2 IMPLANT
DRAPE U-SHAPE 47X51 STRL (DRAPES) ×2 IMPLANT
DRSG PAD ABDOMINAL 8X10 ST (GAUZE/BANDAGES/DRESSINGS) ×3 IMPLANT
DURAPREP 26ML APPLICATOR (WOUND CARE) ×4 IMPLANT
ELECT REM PT RETURN 9FT ADLT (ELECTROSURGICAL) ×2
ELECTRODE REM PT RTRN 9FT ADLT (ELECTROSURGICAL) ×1 IMPLANT
GAUZE SPONGE 4X4 16PLY XRAY LF (GAUZE/BANDAGES/DRESSINGS) ×1 IMPLANT
GLOVE BIO SURGEON STRL SZ7.5 (GLOVE) ×2 IMPLANT
GLOVE BIO SURGEON STRL SZ8 (GLOVE) ×2 IMPLANT
GLOVE EUDERMIC 7 POWDERFREE (GLOVE) ×2 IMPLANT
GLOVE SS BIOGEL STRL SZ 7.5 (GLOVE) ×1 IMPLANT
GLOVE SUPERSENSE BIOGEL SZ 7.5 (GLOVE) ×1
GOWN STRL REUS W/ TWL LRG LVL3 (GOWN DISPOSABLE) ×1 IMPLANT
GOWN STRL REUS W/ TWL XL LVL3 (GOWN DISPOSABLE) ×4 IMPLANT
GOWN STRL REUS W/TWL LRG LVL3 (GOWN DISPOSABLE) ×2
GOWN STRL REUS W/TWL XL LVL3 (GOWN DISPOSABLE) ×8
KIT BASIN OR (CUSTOM PROCEDURE TRAY) ×2 IMPLANT
KIT ROOM TURNOVER OR (KITS) ×2 IMPLANT
KIT SHOULDER TRACTION (DRAPES) ×2 IMPLANT
MANIFOLD NEPTUNE II (INSTRUMENTS) ×2 IMPLANT
NDL SPNL 18GX3.5 QUINCKE PK (NEEDLE) ×1 IMPLANT
NDL SUT 6 .5 CRC .975X.05 MAYO (NEEDLE) IMPLANT
NEEDLE MAYO TAPER (NEEDLE) ×2
NEEDLE SPNL 18GX3.5 QUINCKE PK (NEEDLE) ×2 IMPLANT
NS IRRIG 1000ML POUR BTL (IV SOLUTION) ×2 IMPLANT
PACK SHOULDER (CUSTOM PROCEDURE TRAY) ×2 IMPLANT
PAD ARMBOARD 7.5X6 YLW CONV (MISCELLANEOUS) ×4 IMPLANT
SET ARTHROSCOPY TUBING (MISCELLANEOUS) ×2
SET ARTHROSCOPY TUBING LN (MISCELLANEOUS) ×1 IMPLANT
SLING ARM LRG ADULT FOAM STRAP (SOFTGOODS) IMPLANT
SLING ARM MED ADULT FOAM STRAP (SOFTGOODS) ×2 IMPLANT
SPONGE GAUZE 4X4 12PLY (GAUZE/BANDAGES/DRESSINGS) ×2 IMPLANT
SPONGE LAP 4X18 X RAY DECT (DISPOSABLE) ×2 IMPLANT
STRIP CLOSURE SKIN 1/2X4 (GAUZE/BANDAGES/DRESSINGS) ×2 IMPLANT
SUT MNCRL AB 3-0 PS2 18 (SUTURE) ×2 IMPLANT
SUT PDS AB 0 CT 36 (SUTURE) IMPLANT
SUT RETRIEVER GRASP 30 DEG (SUTURE) ×1 IMPLANT
SYR 20CC LL (SYRINGE) ×2 IMPLANT
TAPE PAPER 3X10 WHT MICROPORE (GAUZE/BANDAGES/DRESSINGS) ×2 IMPLANT
TOWEL OR 17X24 6PK STRL BLUE (TOWEL DISPOSABLE) ×2 IMPLANT
TOWEL OR 17X26 10 PK STRL BLUE (TOWEL DISPOSABLE) ×2 IMPLANT
WAND SUCTION MAX 4MM 90S (SURGICAL WAND) ×2 IMPLANT
WATER STERILE IRR 1000ML POUR (IV SOLUTION) ×2 IMPLANT

## 2013-08-23 NOTE — Discharge Instructions (Signed)
[name]. Supple, M.D., F.A.A.O.S. Orthopaedic Surgery Specializing in Arthroscopic and Reconstructive Surgery of the Shoulder and Knee [phone] [address] [place], [place] [address] - Fax [phone]  POST-OP SHOULDER ARTHROSCOPIC ROTATOR CUFF AND/OR LABRAL REPAIR INSTRUCTIONS  1. Call the office at [phone] to schedule your first post-op appointment 7-10 days from the date of your surgery.  2. Leave the steri-strips in place over your incisions when performing dressing changes and showering. You may remove your dressings and begin showering 72 hours from surgery. You can expect drainage that is clear to bloody in nature that occasionally will soak through your dressings. If this occurs go ahead and perform a dressing change. The drainage should lessen daily and when there is no drainage from your incisions feel free to go without a dressing.  3. Wear your sling/immobilizer at all times except to perform the exercises below or to occasionally let your arm dangle by your side to stretch your elbow. You also need to sleep in your sling immobilizer until instructed otherwise.  4. Range of motion to your elbow, wrist, and hand are encouraged 3-5 times daily. Exercise to your hand and fingers helps to reduce swelling you may experience.  5. Utilize ice to the shoulder 3-4 times minimum a day and additionally if you are experiencing pain.  6. You may one-armed drive when safely off of narcotics and muscle relaxants. You may use your hand that is in the sling to support the steering wheel only. However, should it be your right arm that is in the sling it is not to be used for gear shifting in a manual transmission.  7. If you had a block pre-operatively to provide post-op pain relief you may want to go ahead and begin utilizing your pain meds as your arm begins to wake up. Blocks can sometimes last up to 16-18 hours. If you are still pain-free prior to going to bed you may  want to strongly consider taking a pain medication to avoid being awakened in the night with the onset of pain. A muscle relaxant is also provided for you should you experience muscle spasms. It is recommended that if you are experiencing pain that your pain medication alone is not controlling, add the muscle relaxant along with the pain medication which can give additional pain relief. The first one to two days is generally the most severe of your pain and then should gradually decrease. As your pain lessens it is recommended that you decrease your use of the pain medications to an "as needed basis" only and to always comply with the recommended dosages of the pain medications.  8. Pain medications can produce constipation along with their use. If you experience this, the use of an over the counter stool softener or laxative daily is recommended.   9. For additional questions or concerns, please do not hesitate to call the office. If after hours there is an answering service to forward your concerns to the physician on call.  POST-OP EXERCISES  Pendulum Exercises  Perform pendulum exercises while standing and bending at the waist. Support your uninvolved arm on a table or chair and allow your operated arm to hang freely. Make sure to do these exercises passively - not using you shoulder muscle.  Repeat 20 times. Do 3 sessions per day.  What to eat:  For your first meals, you should eat lightly; only small meals initially.  If you do not have nausea, you may eat larger  meals.  Avoid spicy, greasy and heavy food.    General Anesthesia, Adult, Care After  Refer to this sheet in the next few weeks. These instructions provide you with information on caring for yourself after your procedure. Your health care provider may also give you more specific instructions. Your treatment has been planned according to current medical practices, but problems sometimes occur. Call your health care provider if you  have any problems or questions after your procedure.  WHAT TO EXPECT AFTER THE PROCEDURE  After the procedure, it is typical to experience:  Sleepiness.  Nausea and vomiting. HOME CARE INSTRUCTIONS  For the first 24 hours after general anesthesia:  Have a responsible person with you.  Do not drive a car. If you are alone, do not take public transportation.  Do not drink alcohol.  Do not take medicine that has not been prescribed by your health care provider.  Do not sign important papers or make important decisions.  You may resume a normal diet and activities as directed by your health care provider.  Change bandages (dressings) as directed.  If you have questions or problems that seem related to general anesthesia, call the hospital and ask for the anesthetist or anesthesiologist on call. SEEK MEDICAL CARE IF:  You have nausea and vomiting that continue the day after anesthesia.  You develop a rash. SEEK IMMEDIATE MEDICAL CARE IF:  You have difficulty breathing.  You have chest pain.  You have any allergic problems. Document Released: 04/19/2000 Document Revised: 09/13/2012 Document Reviewed: 07/27/2012  [facility] Patient Information 2014 [place], [place].

## 2013-08-23 NOTE — Op Note (Signed)
08/23/2013  2:39 PM  PATIENT:   [name]  53 y.o. female  PRE-OPERATIVE DIAGNOSIS:  RIGHT SHOULDER IMPINGEMENT WITH AC JOINT OA AND ROTATOR CUFF TEAR  POST-OPERATIVE DIAGNOSIS:  SAME WITH DEGENERATIVE LABRAL TEAR.   PROCEDURE:  RSA, LABRAL DEBRIDEMENT, SAD, DCR,RCR  SURGEON:  Kerstin Crusoe, Metta Clines. M.D.  ASSISTANTS: Shuford pac   ANESTHESIA:   GET + ISB  EBL: min  SPECIMEN:  none  Drains: none   PATIENT DISPOSITION:  PACU - hemodynamically stable.    PLAN OF CARE: Discharge to home after PACU  Dictation# [id number]???

## 2013-08-23 NOTE — Anesthesia Preprocedure Evaluation (Addendum)
Anesthesia Evaluation  Patient identified by MRN, date of birth, ID band Patient awake    Reviewed: Allergy & Precautions, H&P , NPO status , Patient's Chart, lab work & pertinent test results, reviewed documented beta blocker date and time   Airway Mallampati: II TM Distance: >3 FB Neck ROM: Full    Dental no notable dental hx. (+) Teeth Intact, Dental Advisory Given   Pulmonary neg pulmonary ROS, former smoker,  breath sounds clear to auscultation  Pulmonary exam normal       Cardiovascular hypertension, On Medications and On Home Beta Blockers + Peripheral Vascular Disease Rhythm:Regular Rate:Normal     Neuro/Psych  Headaches, negative psych ROS   GI/Hepatic Neg liver ROS, GERD-  Medicated and Controlled,  Endo/Other  diabetesHypothyroidism Morbid obesity  Renal/GU negative Renal ROS  negative genitourinary   Musculoskeletal   Abdominal   Peds  Hematology negative hematology ROS (+)   Anesthesia Other Findings   Reproductive/Obstetrics negative OB ROS                         Anesthesia Physical Anesthesia Plan  ASA: III  Anesthesia Plan: General and Regional   Post-op Pain Management:    Induction: Intravenous  Airway Management Planned: Oral ETT  Additional Equipment:   Intra-op Plan:   Post-operative Plan: Extubation in OR  Informed Consent: I have reviewed the patients History and Physical, chart, labs and discussed the procedure including the risks, benefits and alternatives for the proposed anesthesia with the patient or authorized representative who has indicated his/her understanding and acceptance.   Dental advisory given  Plan Discussed with: CRNA  Anesthesia Plan Comments:         Anesthesia Quick Evaluation

## 2013-08-23 NOTE — Anesthesia Postprocedure Evaluation (Signed)
  Anesthesia Post-op Note  Patient: [name]  Procedure(s) Performed: Procedure(s): RIGHT SHOULDER ARTHROSCOPY WITH  SUBACROMIAL DECOMPRESSION DISTAL CLAVICLE RESECTION AND  ROTATOR CUFF REPAIR  (Right)  Patient Location: PACU  Anesthesia Type:General and block  Level of Consciousness: awake and alert   Airway and Oxygen Therapy: Patient Spontanous Breathing  Post-op Pain: none  Post-op Assessment: Post-op Vital signs reviewed, Patient's Cardiovascular Status Stable and Respiratory Function Stable  Post-op Vital Signs: Reviewed  Filed Vitals:   08/23/13 1534  BP: 98/64  Pulse: 75  Temp:   Resp: 11    Complications: No apparent anesthesia complications

## 2013-08-23 NOTE — Transfer of Care (Signed)
Immediate Anesthesia Transfer of Care Note  Patient: [name]  Procedure(s) Performed: Procedure(s): RIGHT SHOULDER ARTHROSCOPY WITH  SUBACROMIAL DECOMPRESSION DISTAL CLAVICLE RESECTION AND  ROTATOR CUFF REPAIR  (Right)  Patient Location: PACU  Anesthesia Type:General  Level of Consciousness: awake, alert  and oriented  Airway & Oxygen Therapy: Patient Spontanous Breathing and Patient connected to nasal cannula oxygen  Post-op Assessment: Report given to PACU RN, Post -op Vital signs reviewed and stable and Patient moving all extremities X 4  Post vital signs: Reviewed and stable  Complications: No apparent anesthesia complications

## 2013-08-23 NOTE — Anesthesia Procedure Notes (Addendum)
Anesthesia Regional Block:  Interscalene brachial plexus block  Pre-Anesthetic Checklist: ,, timeout performed, Correct Patient, Correct Site, Correct Laterality, Correct Procedure, Correct Position, site marked, Risks and benefits discussed, pre-op evaluation,  At surgeon's request and post-op pain management  Laterality: Right  Prep: Maximum Sterile Barrier Precautions used and chloraprep       Needles:  Injection technique: Single-shot  Needle Type: Echogenic Stimulator Needle     Needle Length: 5cm 5 cm Needle Gauge: 22 and 22 G    Additional Needles:  Procedures: ultrasound guided (picture in chart) and nerve stimulator Interscalene brachial plexus block  Nerve Stimulator or Paresthesia:  Response: Biceps response,   Additional Responses:   Narrative:  Start time: 08/23/2013 12:11 PM End time: 08/23/2013 12:22 PM Injection made incrementally with aspirations every 5 mL. Anesthesiologist: Ola Spurr, MD  Additional Notes: 2% Lidocaine skin wheel.    Procedure Name: Intubation Date/Time: 08/23/2013 1:05 PM Performed by: Rush Farmer E Pre-anesthesia Checklist: Patient identified, Emergency Drugs available, Suction available, Patient being monitored and Timeout performed Patient Re-evaluated:Patient Re-evaluated prior to inductionOxygen Delivery Method: Circle system utilized Preoxygenation: Pre-oxygenation with 100% oxygen Intubation Type: IV induction Ventilation: Mask ventilation without difficulty Laryngoscope Size: Mac and 3 Grade View: Grade I Tube type: Oral Tube size: 7.0 mm Number of attempts: 1 Airway Equipment and Method: Stylet Placement Confirmation: ETT inserted through vocal cords under direct vision,  positive ETCO2 and breath sounds checked- equal and bilateral Secured at: 21 cm Tube secured with: Tape Dental Injury: Teeth and Oropharynx as per pre-operative assessment

## 2013-08-23 NOTE — H&P (Signed)
[name]    Chief Complaint: RIGHT SHOULDER IMPINGEMENT HPI: The patient is a 53 y.o. female with chronic right shoulder pain and impingement syndrome and MRI evidence of full thickness rotator cuff tear  Past Medical History  Diagnosis Date  . Hyperlipidemia   . Arthritis   . Venous insufficiency of leg   . Multiple allergies     takes Zyrtec daily;uses Nasonex daily as needed  . Constipation     takes Colace every other day  . GERD (gastroesophageal reflux disease) 12/06/2011    takes Nexium daily  . Peripheral edema     takes Furosemide daily  . Hypothyroidism 12/06/2011    takes Synthroid daily  . Dry eyes     uses Systane Eye drops daily as needed  . Hypertension     takes Metoprolol and Diovan daily  . History of bronchitis 6-7 yrs ago  . Headache(784.0)     occasionally-d/t congestion  . Congestion of nasal sinus   . Numbness     weakness-right hand  . Joint pain   . Urinary frequency   . Nocturia   . Anemia when she was young  . Diabetes mellitus without complication     borderline    Past Surgical History  Procedure Laterality Date  . Cholecystectomy    . Abdominal hysterectomy      uterine prolapse  . Tubal ligation    . Foot surgery Bilateral   . Back surgery      fusion  . Cardiac catheterization  2007    NORMAL; catheter-induced spasm  . Esophagogastroduodenoscopy  5-40yrs ago  . Colonoscopy      Family History  Problem Relation Age of Onset  . Hypertension Mother   . Hypertension Father   . Hypertension Sister   . Kidney disease Sister     [facility] TRANSPLANT  . Thyroid disease Sister   . Breast cancer Maternal Aunt     >50; passed away from it  . Breast cancer Maternal Aunt     >50  . Thyroid disease Sister   . Hypertension Sister     Social History:  reports that she has quit smoking. She has never used smokeless tobacco. She reports that she drinks alcohol. She reports that she does not use illicit drugs.  Allergies:   Allergies  Allergen Reactions  . Metronidazole     Developed pancreatitis after taking this  . Adhesive [Tape]   . Penicillins Rash  . Vesicare [Solifenacin] Rash    Medications Prior to Admission  Medication Sig Dispense Refill  . aspirin EC 81 MG tablet Take 81 mg by mouth every other day.       . cetirizine (ZYRTEC) 10 MG tablet Take 10 mg by mouth daily.      Marland Kitchen docusate sodium (COLACE) 100 MG capsule Take 100 mg by mouth every other day.      . esomeprazole (NEXIUM) 20 MG capsule Take 20 mg by mouth daily at 12 noon.      . Fish Oil OIL Take 1 capsule by mouth daily.      . furosemide (LASIX) 20 MG tablet Take 1 tablet (20 mg total) by mouth daily.  30 tablet  6  . gabapentin (NEURONTIN) 300 MG capsule Take 300 mg by mouth 2 (two) times daily.       Marland Kitchen HYDROcodone-acetaminophen (NORCO) 10-325 MG per tablet Take 1 tablet by mouth every 6 (six) hours as needed. For pain      .  levothyroxine (SYNTHROID, LEVOTHROID) 75 MCG tablet Take 75 mcg by mouth daily before breakfast.      . meloxicam (MOBIC) 15 MG tablet Take 15 mg by mouth daily.      . metoprolol tartrate (LOPRESSOR) 25 MG tablet Take 25 mg by mouth 2 (two) times daily.      . mometasone (NASONEX) 50 MCG/ACT nasal spray Place 2 sprays into the nose daily as needed (allergies).      . neomycin-polymyxin-hydrocortisone (CORTISPORIN) otic solution Apply one to two drops to toe after soaking twice daily.  10 mL  1  . Pitavastatin Calcium (LIVALO) 2 MG TABS Take 0.5 tablets (1 mg total) by mouth daily.  14 tablet  0  . Polyethyl Glycol-Propyl Glycol (SYSTANE OP) Place 1 drop into both eyes daily as needed (dry eyes).       . valsartan-hydrochlorothiazide (DIOVAN-HCT) 160-12.5 MG per tablet Take 1 tablet by mouth daily.         Physical Exam: right shoulder with painful and restricted motion as noted at recent office visits  Vitals  Pulse Rate:  [57] 57 (07/30 1125) Resp:  [18] 18 (07/30 1125) BP: (111)/(49) 111/49 mmHg (07/30  1125) SpO2:  [100 %] 100 % (07/30 1125)  Assessment/Plan  Impression: RIGHT SHOULDER IMPINGEMENT  Plan of Action: Procedure(s): RIGHT SHOULDER ARTHROSCOPY WITH  SUBACROMIAL DECOMPRESSION DISTAL CLAVICLE RESECTION AND  ROTATOR CUFF REPAIR   Finneus Kaneshiro M 08/23/2013, 12:16 PM

## 2013-08-24 NOTE — Op Note (Deleted)
[name], [name]             ACCOUNT NO.:  [id number]  MEDICAL RECORD NO.:  [id number]  LOCATION:                               FACILITY:  [facility]  PHYSICIAN:  Metta Clines. Tjay Velazquez, M.D.  DATE OF BIRTH:  03-07-1960  DATE OF PROCEDURE:  08/23/2013 DATE OF DISCHARGE:  08/23/2013                              OPERATIVE REPORT   PREOPERATIVE DIAGNOSES: 1. Chronic right shoulder impingement syndrome. 2. Right shoulder rotator cuff tear by MRI criteria. 3. Right shoulder acromioclavicular joint arthrosis.  POSTOPERATIVE DIAGNOSIS: 1. Chronic right shoulder impingement syndrome. 2. Right shoulder full-thickness rotator cuff tear. 3. Right shoulder degenerative labral tear. 4. Right shoulder acromioclavicular joint arthrosis.  PROCEDURES: 1. Right shoulder examination under anesthesia. 2. Right shoulder glenohumeral joint diagnostic arthroscopy. 3. Debridement of degenerative labral tear. 4. Arthroscopic subacromial decompression and bursectomy. 5. Arthroscopic distal clavicle resection. 6. Arthroscopic rotator cuff repair using a double-row suture repair     construct.  SURGEON:  Metta Clines. Alany Borman, M.D.  Terrence DupontOlivia Mackie A. Shuford, PA-C.  ANESTHESIA:  General endotracheal as well as an interscalene block.  ESTIMATED BLOOD LOSS:  Minimal.  DRAINS:  None.  HISTORY:  Ms. [name] is a 53 year old female who has had chronic and progressive increasing right shoulder pain with impingement syndrome and ongoing symptoms that have failed to respond to prolonged attempts at conservative management.  Her preoperative MRI scan shows evidence for full-thickness rotator cuff tear as well as bony impingement.  Due to her ongoing pain and functional limitations, she was brought to the operating room at this time for planned right shoulder arthroscopy as described below.  Preoperatively, I counseled Ms. [name] regarding treatment options and risks versus benefits thereof.  Possible surgical  complications were reviewed including potential for bleeding, infection, neurovascular injury, persistent pain, loss of motion, anesthetic complication, recurrence of rotator cuff tear, and possible need for additional surgery.  She understands and accepts and agrees to planned procedure.  PROCEDURE IN DETAIL:  After undergoing routine preoperative evaluation, the patient received prophylactic antibiotics.  An interscalene block was established in the holding area by the Anesthesia Department. Placed supine on the operating table, underwent smooth induction of a general endotracheal anesthesia.  Turned to the left lateral decubitus position on a beanbag and appropriately padded and protected.  Right shoulder examination under anesthesia showed full motion and no obvious instability patterns.  Right arm was then suspended at 70 degrees of abduction with 15 pounds of traction, and the right shoulder girdle region was sterilely prepped and draped in standard fashion.  Time-out was called.  A posterior portal was established at glenohumeral joint. The anterior portal was established under direct visualization.  The articular surfaces were found to show some evidence for cartilage erosion on the anterior inferior quadrant and some associated labral tearing, and these areas were debrided with a shaver.  No obvious instability pattern noted, but still there is some early degenerative chondrosis of the glenohumeral joint.  Biceps tendon normal in caliber. Biceps anchor was stable.  Degenerative tearing in anterior, superior, and posterior labrum, and these areas were all debrided with a shaver to stable margins.  The rotator cuff showed a minimum articular-sided partial  tearing with flap of tissue __________ on the joint.  This was debrided with a shaver and on further debridement, this indeed appeared to be a full-thickness defect.  At this point, final inspection and irrigation was then  completed within the glenohumeral joint.  Fluid and instrument were removed.  The arm was then dropped down to 30 degrees abduction with the arthroscope introduced into the subacromial space in the posterior portal and a direct lateral portal was established in the subacromial space.  Abundant dense bursal tissue and multiple adhesions were encountered, and these were all divided and excised with a combination of shaver and Stryker wand.  The wand was then used to remove the periosteum from the undersurface of the anterior half of the acromion and a subacromial decompression was performed with a burr, creating a type 1 morphology.  Portal was then established directly into the distal clavicle and distal clavicle resection was performed with a burr and care was taken to confirm visualization of entire circumference of the distal clavicle to ensure adequate removal of bone.  We then completed the subacromial/subdeltoid bursectomy.  The rotator cuff tear was readily identified and the tissue was debrided back to a healthy margin.  The greater tuberosity was cleared of soft tissue, abraded with a burr to bleeding bed.  The defect, full-thickness, was approximately 2 cm in width.  Accessory portal device was established and through a stab wound on the lateral margin of the acromion, placed an Arthrex PEEK corkscrew suture anchor.  The limbs of the suture anchor were then shuttled through the free margin of the rotator cuff in a horizontal mattress pattern and then tied with sliding locking knots, followed by multiple overhand throws and alternating posts.  We then created "suture bridge" with 2 SwiveLock  sublux suture anchors which nicely compressed the free margin of the rotator cuff against the bony bed in tuberosity and overall construct was much to our satisfaction.  Suture limbs were clipped.  Bursectomy was completed.  Fluid and instrument were removed. The portals were closed with  Monocryl and Steri-Strips.  A dry dressing taped at the right shoulder, right arm was placed in a sling.  The patient was awakened, extubated, and taken to the recovery room in stable condition.  Jenetta Loges, PA-C, was used as an Environmental consultant throughout this case, essential for help with positioning of the patient, positioning of the extremity, management of the arthroscopic equipment, tissue manipulation, wound closure, suture management, and intraoperative decision making.  The patient's morbid obesity, BMI of 49, also made this extremely challenging from a technical perspective regarding all aspects including positioning of the patient as well as working through very deep soft tissue planes to gain access to the pathologic structures.     Metta Clines. Demontay Grantham, M.D.     KMS/MEDQ  D:  08/23/2013  T:  08/23/2013  Job:  [id number]

## 2013-08-24 NOTE — Op Note (Signed)
[name], [name]             ACCOUNT NO.:  [id number]  MEDICAL RECORD NO.:  [id number]  LOCATION:                               FACILITY:  [facility]  PHYSICIAN:  Metta Clines. Addis Tuohy, M.D.  DATE OF BIRTH:  Apr 02, 1960  DATE OF PROCEDURE:  08/23/2013 DATE OF DISCHARGE:  08/23/2013                              OPERATIVE REPORT   PREOPERATIVE DIAGNOSES: 1. Chronic right shoulder impingement syndrome. 2. Right shoulder rotator cuff tear by MRI criteria. 3. Right shoulder acromioclavicular joint arthrosis.  POSTOPERATIVE DIAGNOSIS: 1. Chronic right shoulder impingement syndrome. 2. Right shoulder full-thickness rotator cuff tear. 3. Right shoulder degenerative labral tear. 4. Right shoulder acromioclavicular joint arthrosis.  PROCEDURES: 1. Right shoulder examination under anesthesia. 2. Right shoulder glenohumeral joint diagnostic arthroscopy. 3. Debridement of degenerative labral tear. 4. Arthroscopic subacromial decompression and bursectomy. 5. Arthroscopic distal clavicle resection. 6. Arthroscopic rotator cuff repair using a double-row suture repair     construct.  SURGEON:  Metta Clines. Cree Napoli, M.D.  Terrence DupontOlivia Fischer A. Shuford, PA-C.  ANESTHESIA:  General endotracheal as well as an interscalene block.  ESTIMATED BLOOD LOSS:  Minimal.  DRAINS:  None.  HISTORY:  Ms. [name] is a 53 year old female who has had chronic and progressive increasing right shoulder pain with impingement syndrome and ongoing symptoms that have failed to respond to prolonged attempts at conservative management.  Her preoperative MRI scan shows evidence for full-thickness rotator cuff tear as well as bony impingement.  Due to her ongoing pain and functional limitations, she was brought to the operating room at this time for planned right shoulder arthroscopy as described below.  Preoperatively, I counseled Ms. [name] regarding treatment options and risks versus benefits thereof.  Possible surgical  complications were reviewed including potential for bleeding, infection, neurovascular injury, persistent pain, loss of motion, anesthetic complication, recurrence of rotator cuff tear, and possible need for additional surgery.  She understands and accepts and agrees to planned procedure.  PROCEDURE IN DETAIL:  After undergoing routine preoperative evaluation, the patient received prophylactic antibiotics.  An interscalene block was established in the holding area by the Anesthesia Department. Placed supine on the operating table, underwent smooth induction of a general endotracheal anesthesia.  Turned to the left lateral decubitus position on a beanbag and appropriately padded and protected.  Right shoulder examination under anesthesia showed full motion and no obvious instability patterns.  Right arm was then suspended at 70 degrees of abduction with 15 pounds of traction, and the right shoulder girdle region was sterilely prepped and draped in standard fashion.  Time-out was called.  A posterior portal was established at glenohumeral joint. The anterior portal was established under direct visualization.  The articular surfaces were found to show some evidence for cartilage erosion on the anterior inferior quadrant and some associated labral tearing, and these areas were debrided with a shaver.  No obvious instability pattern noted, but still there is some early degenerative chondrosis of the glenohumeral joint.  Biceps tendon normal in caliber. Biceps anchor was stable.  Degenerative tearing in anterior, superior, and posterior labrum, and these areas were all debrided with a shaver to stable margins.  The rotator cuff showed a minimum articular-sided partial  tearing with flap of tissue __________ on the joint.  This was debrided with a shaver and on further debridement, this indeed appeared to be a full-thickness defect.  At this point, final inspection and irrigation was then  completed within the glenohumeral joint.  Fluid and instrument were removed.  The arm was then dropped down to 30 degrees abduction with the arthroscope introduced into the subacromial space in the posterior portal and a direct lateral portal was established in the subacromial space.  Abundant dense bursal tissue and multiple adhesions were encountered, and these were all divided and excised with a combination of shaver and Stryker wand.  The wand was then used to remove the periosteum from the undersurface of the anterior half of the acromion and a subacromial decompression was performed with a burr, creating a type 1 morphology.  Portal was then established directly into the distal clavicle and distal clavicle resection was performed with a burr and care was taken to confirm visualization of entire circumference of the distal clavicle to ensure adequate removal of bone.  We then completed the subacromial/subdeltoid bursectomy.  The rotator cuff tear was readily identified and the tissue was debrided back to a healthy margin.  The greater tuberosity was cleared of soft tissue, abraded with a burr to bleeding bed.  The defect, full-thickness, was approximately 2 cm in width.  Accessory portal device was established and through a stab wound on the lateral margin of the acromion, placed an Arthrex PEEK corkscrew suture anchor.  The limbs of the suture anchor were then shuttled through the free margin of the rotator cuff in a horizontal mattress pattern and then tied with sliding locking knots, followed by multiple overhand throws and alternating posts.  We then created "suture bridge" with 2 SwiveLock  sublux suture anchors which nicely compressed the free margin of the rotator cuff against the bony bed in tuberosity and overall construct was much to our satisfaction.  Suture limbs were clipped.  Bursectomy was completed.  Fluid and instrument were removed. The portals were closed with  Monocryl and Steri-Strips.  A dry dressing taped at the right shoulder, right arm was placed in a sling.  The patient was awakened, extubated, and taken to the recovery room in stable condition.  Jenetta Loges, PA-C, was used as an Environmental consultant throughout this case, essential for help with positioning of the patient, positioning of the extremity, management of the arthroscopic equipment, tissue manipulation, wound closure, suture management, and intraoperative decision making.  The patient's morbid obesity, BMI of 49, also made this extremely challenging from a technical perspective regarding all aspects including positioning of the patient as well as working through very deep soft tissue planes to gain access to the pathologic structures.     Metta Clines. Kimberlea Schlag, M.D.     KMS/MEDQ  D:  08/23/2013  T:  08/23/2013  Job:  [id number]

## 2013-08-27 ENCOUNTER — Encounter (HOSPITAL_COMMUNITY): Payer: Self-pay | Admitting: Orthopedic Surgery

## 2013-08-30 ENCOUNTER — Ambulatory Visit: Payer: 59 | Attending: Orthopedic Surgery | Admitting: Physical Therapy

## 2013-08-30 DIAGNOSIS — M25519 Pain in unspecified shoulder: Secondary | ICD-10-CM | POA: Diagnosis not present

## 2013-08-30 DIAGNOSIS — IMO0001 Reserved for inherently not codable concepts without codable children: Secondary | ICD-10-CM | POA: Insufficient documentation

## 2013-09-03 ENCOUNTER — Ambulatory Visit: Payer: 59 | Admitting: Physical Therapy

## 2013-09-03 DIAGNOSIS — IMO0001 Reserved for inherently not codable concepts without codable children: Secondary | ICD-10-CM | POA: Diagnosis not present

## 2013-09-04 ENCOUNTER — Encounter: Payer: Self-pay | Admitting: Podiatry

## 2013-09-04 ENCOUNTER — Ambulatory Visit (INDEPENDENT_AMBULATORY_CARE_PROVIDER_SITE_OTHER): Payer: 59 | Admitting: Podiatry

## 2013-09-04 VITALS — BP 122/73 | HR 76 | Resp 16

## 2013-09-04 DIAGNOSIS — L600 Ingrowing nail: Secondary | ICD-10-CM

## 2013-09-04 NOTE — Progress Notes (Signed)
She presents today 2 weeks status post matrixectomy fibular border hallux left. She continues to in Betadine warm water and apply Cortisporin otic as directed. She states she has had no pain.  Objective: Vital signs are stable she is alert and oriented x3. There is no erythema edema cellulitis drainage or odor at the surgical site. Injection site of the dorsal aspect of her left foot appears to be doing well also.  Assessment: Osteoarthritis dorsal aspect left foot. Well-healing matrixectomy hallux left.  Plan: Discussed etiology pathology conservative versus surgical therapies. I suggested that she discontinue Betadine start with Epsom salts and water soaks continue to soak and to completely resolved continue Cortisporin Otic twice daily cover during the day.

## 2013-09-05 ENCOUNTER — Ambulatory Visit: Payer: 59

## 2013-09-05 DIAGNOSIS — IMO0001 Reserved for inherently not codable concepts without codable children: Secondary | ICD-10-CM | POA: Diagnosis not present

## 2013-09-10 ENCOUNTER — Ambulatory Visit: Payer: 59 | Admitting: Physical Therapy

## 2013-09-10 DIAGNOSIS — IMO0001 Reserved for inherently not codable concepts without codable children: Secondary | ICD-10-CM | POA: Diagnosis not present

## 2013-09-13 ENCOUNTER — Ambulatory Visit: Payer: 59

## 2013-09-13 DIAGNOSIS — IMO0001 Reserved for inherently not codable concepts without codable children: Secondary | ICD-10-CM | POA: Diagnosis not present

## 2013-09-24 ENCOUNTER — Ambulatory Visit: Payer: 59 | Admitting: Physical Therapy

## 2013-09-24 DIAGNOSIS — IMO0001 Reserved for inherently not codable concepts without codable children: Secondary | ICD-10-CM | POA: Diagnosis not present

## 2013-10-02 ENCOUNTER — Ambulatory Visit: Payer: 59 | Attending: Orthopedic Surgery | Admitting: Physical Therapy

## 2013-10-02 DIAGNOSIS — M25519 Pain in unspecified shoulder: Secondary | ICD-10-CM | POA: Diagnosis not present

## 2013-10-02 DIAGNOSIS — IMO0001 Reserved for inherently not codable concepts without codable children: Secondary | ICD-10-CM | POA: Insufficient documentation

## 2013-10-04 ENCOUNTER — Ambulatory Visit: Payer: 59 | Admitting: Physical Therapy

## 2013-10-04 DIAGNOSIS — IMO0001 Reserved for inherently not codable concepts without codable children: Secondary | ICD-10-CM | POA: Diagnosis not present

## 2013-10-08 ENCOUNTER — Ambulatory Visit: Payer: 59 | Admitting: Physical Therapy

## 2013-10-08 DIAGNOSIS — IMO0001 Reserved for inherently not codable concepts without codable children: Secondary | ICD-10-CM | POA: Diagnosis not present

## 2013-10-10 ENCOUNTER — Ambulatory Visit: Payer: 59 | Admitting: Physical Therapy

## 2013-10-10 DIAGNOSIS — IMO0001 Reserved for inherently not codable concepts without codable children: Secondary | ICD-10-CM | POA: Diagnosis not present

## 2013-10-15 ENCOUNTER — Ambulatory Visit: Payer: 59 | Admitting: Rehabilitation

## 2013-10-15 DIAGNOSIS — IMO0001 Reserved for inherently not codable concepts without codable children: Secondary | ICD-10-CM | POA: Diagnosis not present

## 2013-10-17 ENCOUNTER — Ambulatory Visit: Payer: 59 | Admitting: Physical Therapy

## 2013-10-22 ENCOUNTER — Ambulatory Visit: Payer: 59 | Admitting: Physical Therapy

## 2013-10-22 DIAGNOSIS — IMO0001 Reserved for inherently not codable concepts without codable children: Secondary | ICD-10-CM | POA: Diagnosis not present

## 2013-10-25 ENCOUNTER — Ambulatory Visit: Payer: 59 | Attending: Orthopedic Surgery

## 2013-10-25 DIAGNOSIS — Z9889 Other specified postprocedural states: Secondary | ICD-10-CM | POA: Insufficient documentation

## 2013-10-25 DIAGNOSIS — I10 Essential (primary) hypertension: Secondary | ICD-10-CM | POA: Diagnosis not present

## 2013-10-25 DIAGNOSIS — M25511 Pain in right shoulder: Secondary | ICD-10-CM | POA: Insufficient documentation

## 2013-11-06 ENCOUNTER — Ambulatory Visit: Payer: 59 | Admitting: Physical Therapy

## 2013-11-09 ENCOUNTER — Other Ambulatory Visit: Payer: Self-pay

## 2013-11-26 ENCOUNTER — Encounter: Payer: Self-pay | Admitting: Podiatry

## 2013-11-30 ENCOUNTER — Other Ambulatory Visit: Payer: Self-pay | Admitting: Cardiology

## 2013-11-30 NOTE — Telephone Encounter (Signed)
E sent to pharmacy 

## 2014-05-29 ENCOUNTER — Other Ambulatory Visit (HOSPITAL_COMMUNITY): Payer: Self-pay | Admitting: Orthopaedic Surgery

## 2014-05-29 DIAGNOSIS — M545 Low back pain: Secondary | ICD-10-CM

## 2014-06-03 ENCOUNTER — Encounter (HOSPITAL_COMMUNITY): Payer: Self-pay | Admitting: Emergency Medicine

## 2014-06-03 ENCOUNTER — Emergency Department (INDEPENDENT_AMBULATORY_CARE_PROVIDER_SITE_OTHER)
Admission: EM | Admit: 2014-06-03 | Discharge: 2014-06-03 | Disposition: A | Discharge status: Home / Self Care | Payer: 59 | Source: Home / Self Care | Attending: Family Medicine | Admitting: Family Medicine

## 2014-06-03 DIAGNOSIS — R1013 Epigastric pain: Secondary | ICD-10-CM

## 2014-06-03 MED ORDER — GI COCKTAIL ~~LOC~~
30.0000 mL | Freq: Once | ORAL | Status: AC
  Administered 2014-06-03: 30 mL via ORAL

## 2014-06-03 MED ORDER — HYDROCODONE-ACETAMINOPHEN 10-325 MG PO TABS: 1.0000 | ORAL_TABLET | Freq: Four times a day (QID) | ORAL | Status: DC | PRN

## 2014-06-03 MED ORDER — GI COCKTAIL ~~LOC~~
ORAL | Status: AC
  Filled 2014-06-03: qty 30

## 2014-06-03 NOTE — ED Provider Notes (Signed)
CSN: [id number]     Arrival date & time 06/03/14  1805 History   First MD Initiated Contact with Patient 06/03/14 1853     Chief Complaint  Patient presents with  . Abdominal Pain  . Heartburn   (Consider location/radiation/quality/duration/timing/severity/associated sxs/prior Treatment) Patient is a 54 y.o. female presenting with abdominal pain and heartburn. The history is provided by the patient. No language interpreter was used.  Abdominal Pain Pain location:  Epigastric Pain quality: aching   Pain radiates to:  Does not radiate Timing:  Constant Progression:  Waxing and waning Chronicity:  New Context: eating   Context: not recent illness   Relieved by:  Nothing Worsened by:  Nothing tried Ineffective treatments:  None tried Associated symptoms: constipation   Associated symptoms: no anorexia and no diarrhea   Risk factors: no aspirin use   Heartburn Associated symptoms include abdominal pain.    Past Medical History  Diagnosis Date  . Hyperlipidemia   . Arthritis   . Venous insufficiency of leg   . Multiple allergies     takes Zyrtec daily;uses Nasonex daily as needed  . Constipation     takes Colace every other day  . GERD (gastroesophageal reflux disease) 12/06/2011    takes Nexium daily  . Peripheral edema     takes Furosemide daily  . Hypothyroidism 12/06/2011    takes Synthroid daily  . Dry eyes     uses Systane Eye drops daily as needed  . Hypertension     takes Metoprolol and Diovan daily  . History of bronchitis 6-7 yrs ago  . Headache(784.0)     occasionally-d/t congestion  . Congestion of nasal sinus   . Numbness     weakness-right hand  . Joint pain   . Urinary frequency   . Nocturia   . Anemia when she was young  . Diabetes mellitus without complication     borderline   Past Surgical History  Procedure Laterality Date  . Cholecystectomy    . Abdominal hysterectomy      uterine prolapse  . Tubal ligation    . Foot surgery Bilateral    . Back surgery      fusion  . Cardiac catheterization  2007    NORMAL; catheter-induced spasm  . Esophagogastroduodenoscopy  5-52yrs ago  . Colonoscopy    . Shoulder arthroscopy with rotator cuff repair and subacromial decompression Right 08/23/2013    Procedure: RIGHT SHOULDER ARTHROSCOPY WITH  SUBACROMIAL DECOMPRESSION DISTAL CLAVICLE RESECTION AND  ROTATOR CUFF REPAIR ;  Surgeon: Marin Shutter, MD;  Location: [facility];  Service: Orthopedics;  Laterality: Right;   Family History  Problem Relation Age of Onset  . Hypertension Mother   . Hypertension Father   . Hypertension Sister   . Kidney disease Sister     [facility] TRANSPLANT  . Thyroid disease Sister   . Breast cancer Maternal Aunt     >50; passed away from it  . Breast cancer Maternal Aunt     >50  . Thyroid disease Sister   . Hypertension Sister    History  Substance Use Topics  . Smoking status: Former Research scientist (life sciences)  . Smokeless tobacco: Never Used     Comment: quit smoking in 1993  . Alcohol Use: Yes     Comment: rarely   OB History    Gravida Para Term Preterm AB TAB SAB Ectopic Multiple Living   2 2        2  Review of Systems  Gastrointestinal: Positive for heartburn, abdominal pain and constipation. Negative for diarrhea and anorexia.  All other systems reviewed and are negative.   Allergies  Metronidazole; Adhesive; Penicillins; and Vesicare  Home Medications   Prior to Admission medications   Medication Sig Start Date End Date Taking? Authorizing Provider  aspirin EC 81 MG tablet Take 81 mg by mouth every other day.     Historical Provider, MD  cetirizine (ZYRTEC) 10 MG tablet Take 10 mg by mouth daily.    Historical Provider, MD  diazepam (VALIUM) 5 MG tablet Take 0.5-1 tablets (2.5-5 mg total) by mouth every 6 (six) hours as needed for muscle spasms or sedation. 08/23/13   Olivia Mackie Shuford, PA-C  docusate sodium (COLACE) 100 MG capsule Take 100 mg by mouth every other day.    Historical Provider, MD   esomeprazole (NEXIUM) 20 MG capsule Take 20 mg by mouth daily at 12 noon.    Historical Provider, MD  Fish Oil OIL Take 1 capsule by mouth daily.    Historical Provider, MD  furosemide (LASIX) 20 MG tablet TAKE 1 TABLET BY MOUTH DAILY. 11/30/13   Leonie Man, MD  gabapentin (NEURONTIN) 300 MG capsule Take 300 mg by mouth 2 (two) times daily.     Historical Provider, MD  HYDROcodone-acetaminophen (NORCO) 10-325 MG per tablet Take 1 tablet by mouth every 6 (six) hours as needed. For pain 06/03/14   Fransico Meadow, PA-C  levothyroxine (SYNTHROID, LEVOTHROID) 75 MCG tablet Take 75 mcg by mouth daily before breakfast.    Historical Provider, MD  meloxicam (MOBIC) 15 MG tablet Take 15 mg by mouth daily.    Historical Provider, MD  metoprolol tartrate (LOPRESSOR) 25 MG tablet Take 25 mg by mouth 2 (two) times daily.    Historical Provider, MD  mometasone (NASONEX) 50 MCG/ACT nasal spray Place 2 sprays into the nose daily as needed (allergies).    Historical Provider, MD  neomycin-polymyxin-hydrocortisone (CORTISPORIN) otic solution Apply one to two drops to toe after soaking twice daily. 08/21/13   Max T Hyatt, DPM  ondansetron (ZOFRAN) 4 MG tablet Take 1 tablet (4 mg total) by mouth every 8 (eight) hours as needed for nausea or vomiting. 08/23/13   Jenetta Loges, PA-C  oxyCODONE-acetaminophen (PERCOCET) 5-325 MG per tablet Take 1-2 tablets by mouth every 4 (four) hours as needed. 08/23/13   Olivia Mackie Shuford, PA-C  Pitavastatin Calcium (LIVALO) 2 MG TABS Take 0.5 tablets (1 mg total) by mouth daily. 01/10/13   Mihai Croitoru, MD  Polyethyl Glycol-Propyl Glycol (SYSTANE OP) Place 1 drop into both eyes daily as needed (dry eyes).     Historical Provider, MD  valsartan-hydrochlorothiazide (DIOVAN-HCT) 160-12.5 MG per tablet Take 1 tablet by mouth daily.    Historical Provider, MD   BP 131/61 mmHg  Pulse 81  Temp(Src) 98.8 F (37.1 C) (Oral)  Resp 18  SpO2 100% Physical Exam  Constitutional: She is oriented  to person, place, and time. She appears well-developed and well-nourished.  HENT:  Head: Normocephalic and atraumatic.  Right Ear: External ear normal.  Left Ear: External ear normal.  Nose: Nose normal.  Mouth/Throat: Oropharynx is clear and moist.  Eyes: EOM are normal. Pupils are equal, round, and reactive to light.  Neck: Normal range of motion.  Cardiovascular: Normal rate and normal heart sounds.   Pulmonary/Chest: Effort normal.  Abdominal: Soft. She exhibits no distension. There is no tenderness. There is no rebound and no guarding.  Musculoskeletal: Normal range  of motion.  Neurological: She is alert and oriented to person, place, and time.  Skin: Skin is warm.  Psychiatric: She has a normal mood and affect.  Nursing note and vitals reviewed.   ED Course  Procedures (including critical care time) Labs Review Labs Reviewed - No data to display  Imaging Review No results found. EKG normal sinus 76, pr 140 qrs normal normal qt nonspecific t  Nonacute ekg  MDM Pt had some relief with Gi cocktail.  Pt has normal vital signs.  No sign of acute abdomen Pt reports this feels like her reflux pain.   EKg is nonacute.  I advised pt this could also be a reocurrance of Pancreatitis.  Pt advised if symptoms worsen or change she should go to the Emergency department for evaluation.     1. Epigastric pain     Hydrocodone AVS   Fransico Meadow, PA-C 06/03/14 2025

## 2014-06-03 NOTE — Discharge Instructions (Signed)
Abdominal Pain Many things can cause abdominal pain. Usually, abdominal pain is not caused by a disease and will improve without treatment. It can often be observed and treated at home. Your health care provider will do a physical exam and possibly order blood tests and X-rays to help determine the seriousness of your pain. However, in many cases, more time must pass before a clear cause of the pain can be found. Before that point, your health care provider may not know if you need more testing or further treatment. HOME CARE INSTRUCTIONS  Monitor your abdominal pain for any changes. The following actions may help to alleviate any discomfort you are experiencing:  Only take over-the-counter or prescription medicines as directed by your health care provider.  Do not take laxatives unless directed to do so by your health care provider.  Try a clear liquid diet (broth, tea, or water) as directed by your health care provider. Slowly move to a bland diet as tolerated. SEEK MEDICAL CARE IF:  You have unexplained abdominal pain.  You have abdominal pain associated with nausea or diarrhea.  You have pain when you urinate or have a bowel movement.  You experience abdominal pain that wakes you in the night.  You have abdominal pain that is worsened or improved by eating food.  You have abdominal pain that is worsened with eating fatty foods.  You have a fever. SEEK IMMEDIATE MEDICAL CARE IF:   Your pain does not go away within 2 hours.  You keep throwing up (vomiting).  Your pain is felt only in portions of the abdomen, such as the right side or the left lower portion of the abdomen.  You pass bloody or black tarry stools. MAKE SURE YOU:  Understand these instructions.   Will watch your condition.   Will get help right away if you are not doing well or get worse.  Document Released: 10/21/2004 Document Revised: 01/16/2013 Document Reviewed: 09/20/2012 [facility] Patient Information  2015 [place], [place]. This information is not intended to replace advice given to you by your health care provider. Make sure you discuss any questions you have with your health care provider. Clear Liquid Diet A clear liquid diet is a short-term diet that is prescribed to provide the necessary fluid and basic energy you need when you can have nothing else. The clear liquid diet consists of liquids or solids that will become liquid at room temperature. You should be able to see through the liquid. There are many reasons that you may be restricted to clear liquids, such as:  When you have a sudden-onset (acute) condition that occurs before or after surgery.  To help your body slowly get adjusted to food again after a long period when you were unable to have food.  Replacement of fluids when you have a diarrheal disease.  When you are going to have certain exams, such as a colonoscopy, in which instruments are inserted inside your body to look at parts of your digestive system. WHAT CAN I HAVE? A clear liquid diet does not provide all the nutrients you need. It is important to choose a variety of the following items to get as many nutrients as possible:  Vegetable juices that do not have pulp.  Fruit juices and fruit drinks that do not have pulp.  Coffee (regular or decaffeinated), tea, or soda at the discretion of your health care provider.  Clear bouillon, broth, or strained broth-based soups.  High-protein and flavored gelatins.  Sugar or honey.  Ices or frozen ice pops that do not contain milk. If you are not sure whether you can have certain items, you should ask your health care provider. You may also ask your health care provider if there are any other clear liquid options. Document Released: 01/11/2005 Document Revised: 01/16/2013 Document Reviewed: 12/08/2012 [facility] Patient Information 2015 [place], [place]. This information is not intended to replace advice given to you by your  health care provider. Make sure you discuss any questions you have with your health care provider.

## 2014-06-03 NOTE — ED Notes (Signed)
Pt. Stated, I started having stomach pain since yesterday morning after I ate breakfast and every time I eat.  I've had Pancreatitis before.

## 2014-06-10 ENCOUNTER — Emergency Department (HOSPITAL_COMMUNITY)
Admission: EM | Admit: 2014-06-10 | Discharge: 2014-06-11 | Disposition: A | Discharge status: Home / Self Care | Payer: 59 | Attending: Emergency Medicine | Admitting: Emergency Medicine

## 2014-06-10 ENCOUNTER — Encounter (HOSPITAL_COMMUNITY): Payer: Self-pay | Admitting: Nurse Practitioner

## 2014-06-10 DIAGNOSIS — Z9851 Tubal ligation status: Secondary | ICD-10-CM | POA: Insufficient documentation

## 2014-06-10 DIAGNOSIS — Z90710 Acquired absence of both cervix and uterus: Secondary | ICD-10-CM | POA: Diagnosis not present

## 2014-06-10 DIAGNOSIS — Z791 Long term (current) use of non-steroidal anti-inflammatories (NSAID): Secondary | ICD-10-CM | POA: Diagnosis not present

## 2014-06-10 DIAGNOSIS — E119 Type 2 diabetes mellitus without complications: Secondary | ICD-10-CM | POA: Diagnosis not present

## 2014-06-10 DIAGNOSIS — E039 Hypothyroidism, unspecified: Secondary | ICD-10-CM | POA: Insufficient documentation

## 2014-06-10 DIAGNOSIS — Z862 Personal history of diseases of the blood and blood-forming organs and certain disorders involving the immune mechanism: Secondary | ICD-10-CM | POA: Insufficient documentation

## 2014-06-10 DIAGNOSIS — Z7982 Long term (current) use of aspirin: Secondary | ICD-10-CM | POA: Insufficient documentation

## 2014-06-10 DIAGNOSIS — K5900 Constipation, unspecified: Secondary | ICD-10-CM | POA: Insufficient documentation

## 2014-06-10 DIAGNOSIS — Z87891 Personal history of nicotine dependence: Secondary | ICD-10-CM | POA: Insufficient documentation

## 2014-06-10 DIAGNOSIS — Z880 Allergy status to penicillin: Secondary | ICD-10-CM | POA: Diagnosis not present

## 2014-06-10 DIAGNOSIS — I10 Essential (primary) hypertension: Secondary | ICD-10-CM | POA: Insufficient documentation

## 2014-06-10 DIAGNOSIS — Z9889 Other specified postprocedural states: Secondary | ICD-10-CM | POA: Diagnosis not present

## 2014-06-10 DIAGNOSIS — K219 Gastro-esophageal reflux disease without esophagitis: Secondary | ICD-10-CM | POA: Insufficient documentation

## 2014-06-10 DIAGNOSIS — Z79899 Other long term (current) drug therapy: Secondary | ICD-10-CM | POA: Diagnosis not present

## 2014-06-10 DIAGNOSIS — M1990 Unspecified osteoarthritis, unspecified site: Secondary | ICD-10-CM | POA: Diagnosis not present

## 2014-06-10 DIAGNOSIS — Z7951 Long term (current) use of inhaled steroids: Secondary | ICD-10-CM | POA: Diagnosis not present

## 2014-06-10 DIAGNOSIS — R1013 Epigastric pain: Secondary | ICD-10-CM | POA: Diagnosis not present

## 2014-06-10 DIAGNOSIS — R109 Unspecified abdominal pain: Secondary | ICD-10-CM | POA: Diagnosis present

## 2014-06-10 DIAGNOSIS — Z8669 Personal history of other diseases of the nervous system and sense organs: Secondary | ICD-10-CM | POA: Insufficient documentation

## 2014-06-10 DIAGNOSIS — Z8709 Personal history of other diseases of the respiratory system: Secondary | ICD-10-CM | POA: Diagnosis not present

## 2014-06-10 HISTORY — DX: Acute pancreatitis without necrosis or infection, unspecified: K85.90

## 2014-06-10 LAB — COMPREHENSIVE METABOLIC PANEL
ALT: 26 U/L (ref 14–54)
AST: 29 U/L (ref 15–41)
Albumin: 3.5 g/dL (ref 3.5–5.0)
Alkaline Phosphatase: 95 U/L (ref 38–126)
Anion gap: 9 (ref 5–15)
BUN: 11 mg/dL (ref 6–20)
CO2: 29 mmol/L (ref 22–32)
Calcium: 8.9 mg/dL (ref 8.9–10.3)
Chloride: 101 mmol/L (ref 101–111)
Creatinine, Ser: 0.86 mg/dL (ref 0.44–1.00)
GFR calc Af Amer: >60 mL/min (ref >60)
GFR calc non Af Amer: >60 mL/min (ref >60)
Glucose, Bld: 101 mg/dL — ABNORMAL HIGH (ref 65–99)
Potassium: 3.8 mmol/L (ref 3.5–5.1)
Sodium: 139 mmol/L (ref 135–145)
Total Bilirubin: 0.3 mg/dL (ref 0.3–1.2)
Total Protein: 7.5 g/dL (ref 6.5–8.1)

## 2014-06-10 LAB — CBC WITH DIFFERENTIAL/PLATELET
Basophils Absolute: 0.1 10*3/uL (ref 0.0–0.1)
Basophils Relative: 1 % (ref 0–1)
Eosinophils Absolute: 0.4 10*3/uL (ref 0.0–0.7)
Eosinophils Relative: 5 % (ref 0–5)
HCT: 39.9 % (ref 36.0–46.0)
Hemoglobin: 12.7 g/dL (ref 12.0–15.0)
Lymphocytes Relative: 44 % (ref 12–46)
Lymphs Abs: 3.7 10*3/uL (ref 0.7–4.0)
MCH: 26.2 pg (ref 26.0–34.0)
MCHC: 31.8 g/dL (ref 30.0–36.0)
MCV: 82.4 fL (ref 78.0–100.0)
Monocytes Absolute: 0.7 10*3/uL (ref 0.1–1.0)
Monocytes Relative: 9 % (ref 3–12)
Neutro Abs: 3.4 10*3/uL (ref 1.7–7.7)
Neutrophils Relative %: 41 % — ABNORMAL LOW (ref 43–77)
Platelets: 342 10*3/uL (ref 150–400)
RBC: 4.84 MIL/uL (ref 3.87–5.11)
RDW: 14.6 % (ref 11.5–15.5)
WBC: 8.1 10*3/uL (ref 4.0–10.5)

## 2014-06-10 LAB — URINALYSIS, ROUTINE W REFLEX MICROSCOPIC
Bilirubin Urine: NEGATIVE
Glucose, UA: NEGATIVE mg/dL
Hgb urine dipstick: NEGATIVE
Ketones, ur: NEGATIVE mg/dL
Leukocytes, UA: NEGATIVE
Nitrite: NEGATIVE
Protein, ur: NEGATIVE mg/dL
Specific Gravity, Urine: 1.026 (ref 1.005–1.030)
Urobilinogen, UA: 1 mg/dL (ref 0.0–1.0)
pH: 5.5 (ref 5.0–8.0)

## 2014-06-10 LAB — LIPASE, BLOOD: Lipase: 22 U/L (ref 22–51)

## 2014-06-10 MED ORDER — IOHEXOL 300 MG/ML  SOLN: 25.0000 mL | Freq: Once | INTRAMUSCULAR | Status: AC | PRN

## 2014-06-10 MED ORDER — MORPHINE SULFATE 4 MG/ML IJ SOLN
4.0000 mg | Freq: Once | INTRAMUSCULAR | Status: AC
  Administered 2014-06-10: 4 mg via INTRAVENOUS
  Filled 2014-06-10: qty 1

## 2014-06-10 MED ORDER — ONDANSETRON HCL 4 MG/2ML IJ SOLN
4.0000 mg | Freq: Once | INTRAMUSCULAR | Status: AC
  Administered 2014-06-10: 4 mg via INTRAVENOUS
  Filled 2014-06-10: qty 2

## 2014-06-10 NOTE — ED Notes (Signed)
She c/o abd pain, nausea and constipation since last week. She has a history of pancreatitis and reports this feels the same.

## 2014-06-10 NOTE — ED Notes (Signed)
CT notified pt finished contrast  

## 2014-06-11 ENCOUNTER — Ambulatory Visit (HOSPITAL_COMMUNITY)
Admission: RE | Admit: 2014-06-11 | Discharge: 2014-06-11 | Disposition: A | Discharge status: Home / Self Care | Payer: 59 | Source: Ambulatory Visit | Attending: Orthopaedic Surgery | Admitting: Orthopaedic Surgery

## 2014-06-11 ENCOUNTER — Emergency Department (HOSPITAL_COMMUNITY): Payer: 59

## 2014-06-11 ENCOUNTER — Encounter (HOSPITAL_COMMUNITY): Payer: Self-pay | Admitting: Radiology

## 2014-06-11 DIAGNOSIS — Z981 Arthrodesis status: Secondary | ICD-10-CM | POA: Insufficient documentation

## 2014-06-11 DIAGNOSIS — M545 Low back pain: Secondary | ICD-10-CM | POA: Diagnosis present

## 2014-06-11 DIAGNOSIS — M4806 Spinal stenosis, lumbar region: Secondary | ICD-10-CM | POA: Diagnosis not present

## 2014-06-11 MED ORDER — HYDROCODONE-ACETAMINOPHEN 5-325 MG PO TABS: 1.0000 | ORAL_TABLET | Freq: Four times a day (QID) | ORAL | Status: DC | PRN

## 2014-06-11 MED ORDER — IOHEXOL 300 MG/ML  SOLN
100.0000 mL | Freq: Once | INTRAMUSCULAR | Status: AC | PRN
  Administered 2014-06-11: 100 mL via INTRAVENOUS

## 2014-06-11 MED ORDER — PROMETHAZINE HCL 25 MG PO TABS: 25.0000 mg | ORAL_TABLET | Freq: Three times a day (TID) | ORAL | Status: DC | PRN

## 2014-06-11 NOTE — ED Provider Notes (Signed)
CSN: [id number]     Arrival date & time 06/10/14  1718 History   First MD Initiated Contact with Patient 06/10/14 2127     Chief Complaint  Patient presents with  . Abdominal Pain     (Consider location/radiation/quality/duration/timing/severity/associated sxs/prior Treatment) HPI Patient presents to the emergency department with abdominal pain with nausea over the last week.  The patient states that she does have a history of pancreatitis and this feels similar to that.  The patient states that she is not having any vomiting, diarrhea, weakness, dizziness, headache, blurred vision, back pain, dysuria, incontinence, chest pain, shortness of breath or syncope.  The patient states that she did not take any medications prior to arrival for her symptoms should states palpation makes the pain worse.  Patient states that she did not take any medications prior to arrival.  Past Medical History  Diagnosis Date  . Hyperlipidemia   . Arthritis   . Venous insufficiency of leg   . Multiple allergies     takes Zyrtec daily;uses Nasonex daily as needed  . Constipation     takes Colace every other day  . GERD (gastroesophageal reflux disease) 12/06/2011    takes Nexium daily  . Peripheral edema     takes Furosemide daily  . Hypothyroidism 12/06/2011    takes Synthroid daily  . Dry eyes     uses Systane Eye drops daily as needed  . Hypertension     takes Metoprolol and Diovan daily  . History of bronchitis 6-7 yrs ago  . Headache(784.0)     occasionally-d/t congestion  . Congestion of nasal sinus   . Numbness     weakness-right hand  . Joint pain   . Urinary frequency   . Nocturia   . Anemia when she was young  . Diabetes mellitus without complication     borderline  . Pancreatitis    Past Surgical History  Procedure Laterality Date  . Cholecystectomy    . Abdominal hysterectomy      uterine prolapse  . Tubal ligation    . Foot surgery Bilateral   . Back surgery      fusion  .  Cardiac catheterization  2007    NORMAL; catheter-induced spasm  . Esophagogastroduodenoscopy  5-41yrs ago  . Colonoscopy    . Shoulder arthroscopy with rotator cuff repair and subacromial decompression Right 08/23/2013    Procedure: RIGHT SHOULDER ARTHROSCOPY WITH  SUBACROMIAL DECOMPRESSION DISTAL CLAVICLE RESECTION AND  ROTATOR CUFF REPAIR ;  Surgeon: Marin Shutter, MD;  Location: [facility];  Service: Orthopedics;  Laterality: Right;   Family History  Problem Relation Age of Onset  . Hypertension Mother   . Hypertension Father   . Hypertension Sister   . Kidney disease Sister     [facility] TRANSPLANT  . Thyroid disease Sister   . Breast cancer Maternal Aunt     >50; passed away from it  . Breast cancer Maternal Aunt     >50  . Thyroid disease Sister   . Hypertension Sister    History  Substance Use Topics  . Smoking status: Former Research scientist (life sciences)  . Smokeless tobacco: Never Used     Comment: quit smoking in 1993  . Alcohol Use: Yes     Comment: rarely   OB History    Gravida Para Term Preterm AB TAB SAB Ectopic Multiple Living   2 2        2      Review of Systems  All  other systems negative except as documented in the HPI. All pertinent positives and negatives as reviewed in the HPI.  Allergies  Metronidazole; Adhesive; Penicillins; and Vesicare  Home Medications   Prior to Admission medications   Medication Sig Start Date End Date Taking? Authorizing Provider  ACAI BERRY PO Take 1 capsule by mouth daily.   Yes Historical Provider, MD  aspirin EC 81 MG tablet Take 81 mg by mouth every other day.    Yes Historical Provider, MD  diazepam (VALIUM) 5 MG tablet Take 0.5-1 tablets (2.5-5 mg total) by mouth every 6 (six) hours as needed for muscle spasms or sedation. 08/23/13  Yes Tracy Shuford, PA-C  docusate sodium (COLACE) 100 MG capsule Take 100 mg by mouth every other day.   Yes Historical Provider, MD  esomeprazole (NEXIUM) 20 MG capsule Take 20 mg by mouth daily at 12 noon.   Yes  Historical Provider, MD  fexofenadine (ALLEGRA) 180 MG tablet Take 180 mg by mouth daily.   Yes Historical Provider, MD  Fish Oil OIL Take 1 capsule by mouth daily.   Yes Historical Provider, MD  furosemide (LASIX) 20 MG tablet TAKE 1 TABLET BY MOUTH DAILY. 11/30/13  Yes Leonie Man, MD  gabapentin (NEURONTIN) 300 MG capsule Take 300 mg by mouth 2 (two) times daily.    Yes Historical Provider, MD  HYDROcodone-acetaminophen (NORCO) 10-325 MG per tablet Take 1 tablet by mouth every 6 (six) hours as needed. For pain 06/03/14  Yes Fransico Meadow, PA-C  levothyroxine (SYNTHROID, LEVOTHROID) 75 MCG tablet Take 75 mcg by mouth daily before breakfast.   Yes Historical Provider, MD  meloxicam (MOBIC) 15 MG tablet Take 15 mg by mouth daily.   Yes Historical Provider, MD  metoprolol tartrate (LOPRESSOR) 25 MG tablet Take 25 mg by mouth 2 (two) times daily.   Yes Historical Provider, MD  mometasone (NASONEX) 50 MCG/ACT nasal spray Place 2 sprays into the nose daily as needed (allergies).   Yes Historical Provider, MD  Pitavastatin Calcium (LIVALO) 2 MG TABS Take 0.5 tablets (1 mg total) by mouth daily. 01/10/13  Yes Mihai Croitoru, MD  Polyethyl Glycol-Propyl Glycol (SYSTANE OP) Place 1 drop into both eyes daily as needed (dry eyes).    Yes Historical Provider, MD  valsartan-hydrochlorothiazide (DIOVAN-HCT) 160-25 MG per tablet Take 1 tablet by mouth daily.   Yes Historical Provider, MD  cetirizine (ZYRTEC) 10 MG tablet Take 10 mg by mouth daily.    Historical Provider, MD  neomycin-polymyxin-hydrocortisone (CORTISPORIN) otic solution Apply one to two drops to toe after soaking twice daily. Patient not taking: Reported on 06/10/2014 08/21/13   Max T Hyatt, DPM  ondansetron (ZOFRAN) 4 MG tablet Take 1 tablet (4 mg total) by mouth every 8 (eight) hours as needed for nausea or vomiting. Patient not taking: Reported on 06/10/2014 08/23/13   Jenetta Loges, PA-C  oxyCODONE-acetaminophen (PERCOCET) 5-325 MG per tablet  Take 1-2 tablets by mouth every 4 (four) hours as needed. Patient not taking: Reported on 06/10/2014 08/23/13   Jenetta Loges, PA-C  valsartan-hydrochlorothiazide (DIOVAN-HCT) 160-12.5 MG per tablet Take 1 tablet by mouth daily.    Historical Provider, MD   BP 100/55 mmHg  Pulse 73  Temp(Src) 98.2 F (36.8 C) (Oral)  Resp 16  SpO2 97% Physical Exam  Constitutional: She is oriented to person, place, and time. She appears well-developed and well-nourished. No distress.  HENT:  Head: Normocephalic and atraumatic.  Mouth/Throat: Oropharynx is clear and moist.  Eyes: Pupils are  equal, round, and reactive to light.  Neck: Normal range of motion. Neck supple.  Cardiovascular: Normal rate, regular rhythm and normal heart sounds.  Exam reveals no gallop and no friction rub.   No murmur heard. Pulmonary/Chest: Effort normal and breath sounds normal. No respiratory distress.  Abdominal: Soft. Bowel sounds are normal. She exhibits no distension. There is tenderness. There is no rebound and no guarding.  Musculoskeletal: She exhibits no edema.  Neurological: She is alert and oriented to person, place, and time. She exhibits normal muscle tone. Coordination normal.  Skin: Skin is warm and dry. No rash noted. No erythema.  Nursing note and vitals reviewed.   ED Course  Procedures (including critical care time) Labs Review Labs Reviewed  CBC WITH DIFFERENTIAL/PLATELET - Abnormal; Notable for the following:    Neutrophils Relative % 41 (*)    All other components within normal limits  COMPREHENSIVE METABOLIC PANEL - Abnormal; Notable for the following:    Glucose, Bld 101 (*)    All other components within normal limits  URINALYSIS, ROUTINE W REFLEX MICROSCOPIC - Abnormal; Notable for the following:    APPearance CLOUDY (*)    All other components within normal limits  LIPASE, BLOOD    Imaging Review Mr Lumbar Spine Wo Contrast  06/11/2014   CLINICAL DATA:  Low back pain with left leg pain.   Lumbar fusion.  EXAM: MRI LUMBAR SPINE WITHOUT CONTRAST  TECHNIQUE: Multiplanar, multisequence MR imaging of the lumbar spine was performed. No intravenous contrast was administered.  COMPARISON:  Lumbar MRI 12/02/2008  FINDINGS: Interval pedicle screw fusion L3-4 and L4-5. Interval interbody fusion L4-5 which appears solid. No interbody fusion at L3-4.  Normal lumbar alignment. Negative for fracture or mass. Large hemangioma occupies most of the L5 vertebral body and is unchanged. Conus medullaris normal and terminates at L1-2.  T12-L1:  Small central disc protrusion unchanged.  L1-2:  Mild degenerative change without stenosis  L2-3: Bilateral facet hypertrophy causing mild spinal stenosis. No significant disc degeneration  L3-4: Interval pedicle screw fusion and decompression. Disc space remains normal in height and signal. No significant spinal stenosis.  L4-5: Pedicle screw and interbody fusion. 10 x 15 mm fluid collection post to the dura. Posterior decompression without stenosis  L5-S1: Bilateral facet hypertrophy specially on the left causing left foraminal encroachment. This has progressed in the interval.  IMPRESSION: Interval fusion L3-4 L4-5. Posterior decompression at these levels without stenosis.  Progressive facet degeneration and mild spinal stenosis at L2-3.  Progressive facet degeneration on the left at L5-S1 with left foraminal encroachment.   Electronically Signed   By: Franchot Gallo M.D.   On: 06/11/2014 20:19   Ct Abdomen Pelvis W Contrast  06/11/2014   CLINICAL DATA:  Abdominal pain, nausea and constipation for 1 week.  EXAM: CT ABDOMEN AND PELVIS WITH CONTRAST  TECHNIQUE: Multidetector CT imaging of the abdomen and pelvis was performed using the standard protocol following bolus administration of intravenous contrast.  CONTRAST:  128mL OMNIPAQUE IOHEXOL 300 MG/ML  SOLN  COMPARISON:  CT 06/23/2013  FINDINGS: Atelectasis or scarring in the lingula. The lung bases are otherwise clear.  No  focal hepatic lesion. Diffusely decreased hepatic density consistent with steatosis. Clips in gallbladder fossa from cholecystectomy. No disproportionate biliary dilatation. The spleen, adrenal glands, and pancreas are unremarkable. No peripancreatic inflammatory change. Kidneys demonstrate symmetric enhancement and excretion. No hydronephrosis or localizing renal abnormality.  The stomach is physiologically distended. There are no dilated or thickened bowel loops. The  appendix is normal. Moderate stool throughout the colon. No colonic wall thickening or surrounding inflammatory change. No free air, free fluid, or intra-abdominal fluid collection.  Abdominal aorta is normal in caliber with mild atherosclerosis. No retroperitoneal adenopathy.  Within the pelvis the urinary bladder is minimally distended. Uterus surgically absent. No adnexal mass. No pelvic free fluid. No pelvic adenopathy.  Postsurgical change L3-L5. There are no acute or suspicious osseous abnormalities. Sclerosis about sacroiliac joints and pubic symphysis, unchanged.  IMPRESSION: No acute abnormality in the abdomen/pelvis.   Electronically Signed   By: Jeb Levering M.D.   On: 06/11/2014 00:47    Patient be treated for gastritis.  Told to return here as needed.  Told follow up with her primary care Dr. told to increase her fluid intake and rest as much as possible.  Patient agrees the plan and all questions are answered.  She is feeling better at this time    Dalia Heading, PA-C 06/12/14 0105  Virgel Manifold, MD 06/12/14 [phone]

## 2014-06-11 NOTE — Discharge Instructions (Signed)
Return here as needed. Your scan did not show any abnormalities. Follow up with your doctor.

## 2014-06-14 DIAGNOSIS — K5900 Constipation, unspecified: Secondary | ICD-10-CM | POA: Insufficient documentation

## 2014-07-16 ENCOUNTER — Other Ambulatory Visit: Payer: Self-pay | Admitting: Cardiology

## 2014-07-16 ENCOUNTER — Other Ambulatory Visit: Payer: Self-pay | Admitting: *Deleted

## 2014-07-16 MED ORDER — FUROSEMIDE 20 MG PO TABS: 20.0000 mg | ORAL_TABLET | Freq: Every day | ORAL | Status: DC

## 2014-07-16 NOTE — Telephone Encounter (Signed)
Rx has been sent to the pharmacy electronically. ° °

## 2014-07-16 NOTE — Telephone Encounter (Signed)
Rx(s) sent to pharmacy electronically. Patient scheduled for OV 9/14

## 2014-08-20 ENCOUNTER — Other Ambulatory Visit: Payer: Self-pay | Admitting: *Deleted

## 2014-08-20 MED ORDER — FUROSEMIDE 20 MG PO TABS
20.0000 mg | ORAL_TABLET | Freq: Every day | ORAL | Status: DC
Start: 2014-08-20 — End: 2015-01-21

## 2014-08-20 NOTE — Telephone Encounter (Signed)
Rx(s) sent to pharmacy electronically.  

## 2014-09-04 ENCOUNTER — Other Ambulatory Visit: Payer: Self-pay

## 2014-09-04 DIAGNOSIS — Z1231 Encounter for screening mammogram for malignant neoplasm of breast: Secondary | ICD-10-CM

## 2014-10-09 ENCOUNTER — Ambulatory Visit (INDEPENDENT_AMBULATORY_CARE_PROVIDER_SITE_OTHER): Payer: 59 | Admitting: Cardiovascular Disease

## 2014-10-09 ENCOUNTER — Encounter: Payer: Self-pay | Admitting: Cardiovascular Disease

## 2014-10-09 VITALS — BP 120/72 | HR 58 | Resp 16 | Ht 63.0 in | Wt 278.7 lb

## 2014-10-09 DIAGNOSIS — E785 Hyperlipidemia, unspecified: Secondary | ICD-10-CM | POA: Diagnosis not present

## 2014-10-09 DIAGNOSIS — I10 Essential (primary) hypertension: Secondary | ICD-10-CM

## 2014-10-09 MED ORDER — PITAVASTATIN CALCIUM 4 MG PO TABS: 1.0000 | ORAL_TABLET | Freq: Every day | ORAL | Status: DC

## 2014-10-09 MED ORDER — ALBUTEROL SULFATE HFA 108 (90 BASE) MCG/ACT IN AERS: 1.0000 | INHALATION_SPRAY | Freq: Four times a day (QID) | RESPIRATORY_TRACT | Status: AC | PRN

## 2014-10-09 NOTE — Patient Instructions (Signed)
Your physician wants you to follow-up in: 1 year  Or sooner if needed. You will receive a reminder letter in the mail two months in advance. If you don't receive a letter, please call our office to schedule the follow-up appointment.

## 2014-10-09 NOTE — Progress Notes (Signed)
Patient ID: [name], female   DOB: 1960-06-06, 54 y.o.   MRN: [id number]      Cardiology Office Note   Date:  10/09/2014   ID:  [name], DOB [birth year], MRN [id number]  PCP:  Velna Hatchet, MD  Cardiologist:   Sanda Klein, MD   Chief Complaint  Patient presents with  . Follow-up    no chest pain, SOB or leg swelling      History of Present Illness: [name] is a 54 y.o. female who presents for  Follow-up for hypertension and hyperlipidemia in the setting of morbid obesity.   She feels generally well. She had an emergency room visit for abdominal discomfort which seemed to improve with proton pump inhibitor dose adjustment.  She had what sounds like infectious bronchitis over the last couple of weeks and continues to have a cough that is mildly productive. She is wheezing a little bit today. She has not had anginal chest pain, palpitations, lower extremity edema, syncope, focal neurological deficits or other cardiovascular complaints.  She has a long-time history of hyperlipidemia and hypertension and has super morbid obesity. She underwent coronary angiography in 2007 and did not have evidence of coronary artery disease. 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 In 2013 she was hospitalized with chest pain, apparently related to drug-induced pancreatitis (metronidazole). In 2015 she underwent an echocardiogram that is essentially normal except for mild diastolic dysfunction. She had a cholecystectomy in her 89s and steatohepatitis by imaging studies. She has gastroesophageal reflux disease which is occasionally symptomatic. She has multiple arthralgias and needs to take a nonsteroidal anti-inflammatory drug a regular basis. She has chronic low back pain despite lumbar spine surgery in 2012.  Past Medical History  Diagnosis Date  . Hyperlipidemia   . Arthritis   . Venous insufficiency of leg     . Multiple allergies     takes Zyrtec daily;uses Nasonex daily as needed  . Constipation     takes Colace every other day  . GERD (gastroesophageal reflux disease) 12/06/2011    takes Nexium daily  . Peripheral edema     takes Furosemide daily  . Hypothyroidism 12/06/2011    takes Synthroid daily  . Dry eyes     uses Systane Eye drops daily as needed  . Hypertension     takes Metoprolol and Diovan daily  . History of bronchitis 6-7 yrs ago  . Headache(784.0)     occasionally-d/t congestion  . Congestion of nasal sinus   . Numbness     weakness-right hand  . Joint pain   . Urinary frequency   . Nocturia   . Anemia when she was young  . Diabetes mellitus without complication     borderline  . Pancreatitis     Past Surgical History  Procedure Laterality Date  . Cholecystectomy    . Abdominal hysterectomy      uterine prolapse  . Tubal ligation    . Foot surgery Bilateral   . Back surgery      fusion  . Cardiac catheterization  2007    NORMAL; catheter-induced spasm  . Esophagogastroduodenoscopy  5-36yrs ago  . Colonoscopy    . Shoulder arthroscopy with rotator cuff repair and subacromial decompression Right 08/23/2013    Procedure: RIGHT SHOULDER ARTHROSCOPY WITH  SUBACROMIAL DECOMPRESSION DISTAL CLAVICLE RESECTION AND  ROTATOR CUFF REPAIR ;  Surgeon: Marin Shutter, MD;  Location: [place];  Service: Orthopedics;  Laterality: Right;     Current Outpatient Prescriptions  Medication Sig Dispense Refill  . ACAI BERRY PO Take 1 capsule by mouth daily.    Marland Kitchen aspirin EC 81 MG tablet Take 81 mg by mouth every other day.     . cetirizine (ZYRTEC) 10 MG tablet Take 10 mg by mouth daily.    . diazepam (VALIUM) 5 MG tablet Take 0.5-1 tablets (2.5-5 mg total) by mouth every 6 (six) hours as needed for muscle spasms or sedation. 40 tablet 1  . docusate sodium (COLACE) 100 MG capsule Take 100 mg by mouth every other day.    . esomeprazole (NEXIUM) 40 MG capsule Take 40 mg by mouth  daily at 12 noon.    . fexofenadine (ALLEGRA) 180 MG tablet Take 180 mg by mouth daily.    . Fish Oil OIL Take 1 capsule by mouth daily.    . furosemide (LASIX) 20 MG tablet Take 1 tablet (20 mg total) by mouth daily. 90 tablet 1  . gabapentin (NEURONTIN) 300 MG capsule Take 300 mg by mouth 2 (two) times daily.     Marland Kitchen HYDROcodone-acetaminophen (NORCO) 10-325 MG per tablet Take 1 tablet by mouth every 6 (six) hours as needed for severe pain.    Marland Kitchen levothyroxine (SYNTHROID, LEVOTHROID) 75 MCG tablet Take 75 mcg by mouth daily before breakfast.    . lubiprostone (AMITIZA) 8 MCG capsule Take 8 mcg by mouth daily with breakfast.    . meloxicam (MOBIC) 15 MG tablet Take 15 mg by mouth daily.    . metoprolol tartrate (LOPRESSOR) 25 MG tablet Take 25 mg by mouth 2 (two) times daily.    . mometasone (NASONEX) 50 MCG/ACT nasal spray Place 2 sprays into the nose daily as needed (allergies).    . neomycin-polymyxin-hydrocortisone (CORTISPORIN) otic solution Apply one to two drops to toe after soaking twice daily. 10 mL 1  . ondansetron (ZOFRAN) 4 MG tablet Take 1 tablet (4 mg total) by mouth every 8 (eight) hours as needed for nausea or vomiting. 20 tablet 0  . oxyCODONE-acetaminophen (PERCOCET) 5-325 MG per tablet Take 1-2 tablets by mouth every 4 (four) hours as needed. 50 tablet 0  . Pitavastatin Calcium (LIVALO) 2 MG TABS Take 0.5 tablets (1 mg total) by mouth daily. 14 tablet 0  . Polyethyl Glycol-Propyl Glycol (SYSTANE OP) Place 1 drop into both eyes daily as needed (dry eyes).     . promethazine (PHENERGAN) 25 MG tablet Take 1 tablet (25 mg total) by mouth every 8 (eight) hours as needed for nausea or vomiting. 15 tablet 0  . valsartan-hydrochlorothiazide (DIOVAN-HCT) 160-12.5 MG per tablet Take 1 tablet by mouth daily.    . valsartan-hydrochlorothiazide (DIOVAN-HCT) 160-25 MG per tablet Take 1 tablet by mouth daily.    Marland Kitchen albuterol (PROVENTIL HFA;VENTOLIN HFA) 108 (90 BASE) MCG/ACT inhaler Inhale 1-2  puffs into the lungs every 6 (six) hours as needed for wheezing or shortness of breath. 1 Inhaler 2  . Pitavastatin Calcium (LIVALO) 4 MG TABS Take 1 tablet (4 mg total) by mouth daily. 28 tablet 0   No current facility-administered medications for this visit.    Allergies:   Metronidazole; Adhesive; Penicillins; and Vesicare    Social History:  The patient  reports that she has quit smoking. She has never used smokeless tobacco. She reports that she drinks alcohol. She reports that she does not use illicit drugs.   Family History:  The patient's family history includes Breast cancer in her maternal  aunt and maternal aunt; Hypertension in her father, mother, sister, and sister; Kidney disease in her sister; Thyroid disease in her sister and sister.    ROS:  Please see the history of present illness.    Otherwise, review of systems positive for none.   All other systems are reviewed and negative.    PHYSICAL EXAM: VS:  BP 120/72 mmHg  Pulse 58  Resp 16  Ht 5\' 3"  (1.6 m)  Wt 278 lb 11.2 oz (126.417 kg)  BMI 49.38 kg/m2 , BMI Body mass index is 49.38 kg/(m^2).  General: Alert, oriented x3, no distress,  Obesity limits her physical examination Head: no evidence of trauma, PERRL, EOMI, no exophtalmos or lid lag, no myxedema, no xanthelasma; normal ears, nose and oropharynx Neck: normal jugular venous pulsations and no hepatojugular reflux; brisk carotid pulses without delay and no carotid bruits Chest: clear to auscultation, no signs of consolidation by percussion or palpation, normal fremitus, symmetrical and full respiratory excursions Cardiovascular: unable to locate the apical impulse, regular rhythm, normal first and second heart sounds, no   murmurs, rubs or gallops Abdomen: no tenderness or distention, no masses by palpation, no abnormal pulsatility or arterial bruits, normal bowel sounds, no hepatosplenomegaly Extremities: no clubbing, cyanosis or edema; 2+ radial, ulnar and  brachial pulses bilaterally; 2+ right femoral, posterior tibial and dorsalis pedis pulses; 2+ left femoral, posterior tibial and dorsalis pedis pulses; no subclavian or femoral bruits Neurological: grossly nonfocal Psych: euthymic mood, full affect   EKG:  EKG is not ordered today.   Recent Labs: 06/10/2014: ALT 26; BUN 11; Creatinine, Ser 0.86; Hemoglobin 12.7; Platelets 342; Potassium 3.8; Sodium 139    Lipid Panel    Component Value Date/Time   CHOL 167 06/19/2013 0813   TRIG 115 06/19/2013 0813   HDL 60 06/19/2013 0813   CHOLHDL 2.8 06/19/2013 0813   VLDL 23 06/19/2013 0813   LDLCALC 84 06/19/2013 0813      Wt Readings from Last 3 Encounters:  10/09/14 278 lb 11.2 oz (126.417 kg)  08/21/13 277 lb (125.646 kg)  08/10/13 277 lb 9 oz (125.9 kg)       ASSESSMENT AND PLAN:   since his biggest problem remains severe obesity. Out focus a lot of her efforts on calorie restriction, regular exercise and we discussed ways to lose weight. Her blood pressure and lipid profile are both very favorably treated.   She seems to have some residual bronchospasm from a upper risk for infection and I gave her prescription for an albuterol inhaler to use as needed..    Current medicines are reviewed at length with the patient today.  The patient does not have concerns regarding medicines.  The following changes have been made:  no change  Labs/ tests ordered today include:  No orders of the defined types were placed in this encounter.    Patient Instructions  Your physician wants you to follow-up in: 1 year  Or sooner if needed. You will receive a reminder letter in the mail two months in advance. If you don't receive a letter, please call our office to schedule the follow-up appointment.   Mikael Spray, MD  10/09/2014 4:32 PM    Sanda Klein, MD, [facility] [phone] office [phone] pager

## 2014-10-10 ENCOUNTER — Encounter: Payer: Self-pay | Admitting: Cardiovascular Disease

## 2014-10-14 ENCOUNTER — Ambulatory Visit
Admission: RE | Admit: 2014-10-14 | Discharge: 2014-10-14 | Disposition: A | Discharge status: Home / Self Care | Payer: 59 | Source: Ambulatory Visit

## 2014-10-14 ENCOUNTER — Other Ambulatory Visit: Payer: Self-pay | Admitting: *Deleted

## 2014-10-14 ENCOUNTER — Other Ambulatory Visit: Payer: Self-pay | Admitting: Cardiovascular Disease

## 2014-10-14 DIAGNOSIS — E039 Hypothyroidism, unspecified: Secondary | ICD-10-CM

## 2014-10-14 DIAGNOSIS — Z1231 Encounter for screening mammogram for malignant neoplasm of breast: Secondary | ICD-10-CM

## 2014-10-15 LAB — TSH: TSH: 1.891 u[IU]/mL (ref 0.350–4.500)

## 2014-11-09 ENCOUNTER — Emergency Department (HOSPITAL_COMMUNITY)
Admission: EM | Admit: 2014-11-09 | Discharge: 2014-11-09 | Disposition: A | Discharge status: Home / Self Care | Payer: 59 | Attending: Emergency Medicine | Admitting: Emergency Medicine

## 2014-11-09 ENCOUNTER — Emergency Department (HOSPITAL_COMMUNITY): Payer: 59

## 2014-11-09 ENCOUNTER — Encounter (HOSPITAL_COMMUNITY): Payer: Self-pay

## 2014-11-09 DIAGNOSIS — I10 Essential (primary) hypertension: Secondary | ICD-10-CM | POA: Diagnosis not present

## 2014-11-09 DIAGNOSIS — Z8679 Personal history of other diseases of the circulatory system: Secondary | ICD-10-CM | POA: Diagnosis not present

## 2014-11-09 DIAGNOSIS — E663 Overweight: Secondary | ICD-10-CM | POA: Insufficient documentation

## 2014-11-09 DIAGNOSIS — Z9889 Other specified postprocedural states: Secondary | ICD-10-CM | POA: Diagnosis not present

## 2014-11-09 DIAGNOSIS — R0602 Shortness of breath: Secondary | ICD-10-CM

## 2014-11-09 DIAGNOSIS — Z7982 Long term (current) use of aspirin: Secondary | ICD-10-CM | POA: Diagnosis not present

## 2014-11-09 DIAGNOSIS — K219 Gastro-esophageal reflux disease without esophagitis: Secondary | ICD-10-CM | POA: Insufficient documentation

## 2014-11-09 DIAGNOSIS — Z87891 Personal history of nicotine dependence: Secondary | ICD-10-CM | POA: Insufficient documentation

## 2014-11-09 DIAGNOSIS — M1990 Unspecified osteoarthritis, unspecified site: Secondary | ICD-10-CM | POA: Insufficient documentation

## 2014-11-09 DIAGNOSIS — M7989 Other specified soft tissue disorders: Secondary | ICD-10-CM

## 2014-11-09 DIAGNOSIS — E039 Hypothyroidism, unspecified: Secondary | ICD-10-CM | POA: Diagnosis not present

## 2014-11-09 DIAGNOSIS — R911 Solitary pulmonary nodule: Secondary | ICD-10-CM | POA: Diagnosis not present

## 2014-11-09 DIAGNOSIS — Z862 Personal history of diseases of the blood and blood-forming organs and certain disorders involving the immune mechanism: Secondary | ICD-10-CM | POA: Insufficient documentation

## 2014-11-09 DIAGNOSIS — E785 Hyperlipidemia, unspecified: Secondary | ICD-10-CM | POA: Insufficient documentation

## 2014-11-09 DIAGNOSIS — Z880 Allergy status to penicillin: Secondary | ICD-10-CM | POA: Diagnosis not present

## 2014-11-09 DIAGNOSIS — K5900 Constipation, unspecified: Secondary | ICD-10-CM | POA: Insufficient documentation

## 2014-11-09 DIAGNOSIS — Z791 Long term (current) use of non-steroidal anti-inflammatories (NSAID): Secondary | ICD-10-CM | POA: Diagnosis not present

## 2014-11-09 DIAGNOSIS — R2241 Localized swelling, mass and lump, right lower limb: Secondary | ICD-10-CM | POA: Diagnosis present

## 2014-11-09 LAB — CBC WITH DIFFERENTIAL/PLATELET
BASOS ABS: 0 10*3/uL (ref 0.0–0.1)
BASOS PCT: 1 %
EOS PCT: 4 %
Eosinophils Absolute: 0.3 10*3/uL (ref 0.0–0.7)
HCT: 37.9 % (ref 36.0–46.0)
Hemoglobin: 12.2 g/dL (ref 12.0–15.0)
Lymphocytes Relative: 37 %
Lymphs Abs: 2.3 10*3/uL (ref 0.7–4.0)
MCH: 27.2 pg (ref 26.0–34.0)
MCHC: 32.2 g/dL (ref 30.0–36.0)
MCV: 84.4 fL (ref 78.0–100.0)
MONO ABS: 0.6 10*3/uL (ref 0.1–1.0)
Monocytes Relative: 9 %
Neutro Abs: 3 10*3/uL (ref 1.7–7.7)
Neutrophils Relative %: 49 %
Platelets: 335 10*3/uL (ref 150–400)
RBC: 4.49 MIL/uL (ref 3.87–5.11)
RDW: 14.3 % (ref 11.5–15.5)
WBC: 6.2 10*3/uL (ref 4.0–10.5)

## 2014-11-09 LAB — I-STAT TROPONIN, ED
TROPONIN I, POC: 0 ng/mL (ref 0.00–0.08)
Troponin i, poc: 0 ng/mL (ref 0.00–0.08)

## 2014-11-09 LAB — COMPREHENSIVE METABOLIC PANEL
ALT: 26 U/L (ref 14–54)
AST: 38 U/L (ref 15–41)
Albumin: 3.6 g/dL (ref 3.5–5.0)
Alkaline Phosphatase: 92 U/L (ref 38–126)
Anion gap: 6 (ref 5–15)
BILIRUBIN TOTAL: 0.7 mg/dL (ref 0.3–1.2)
BUN: 12 mg/dL (ref 6–20)
CO2: 28 mmol/L (ref 22–32)
Calcium: 8.8 mg/dL — ABNORMAL LOW (ref 8.9–10.3)
Chloride: 106 mmol/L (ref 101–111)
Creatinine, Ser: 0.65 mg/dL (ref 0.44–1.00)
GFR calc Af Amer: >60 mL/min (ref >60)
Glucose, Bld: 108 mg/dL — ABNORMAL HIGH (ref 65–99)
POTASSIUM: 4.2 mmol/L (ref 3.5–5.1)
Sodium: 140 mmol/L (ref 135–145)
TOTAL PROTEIN: 7.8 g/dL (ref 6.5–8.1)

## 2014-11-09 LAB — BRAIN NATRIURETIC PEPTIDE: B Natriuretic Peptide: 104.3 pg/mL — ABNORMAL HIGH (ref 0.0–100.0)

## 2014-11-09 MED ORDER — IOHEXOL 350 MG/ML SOLN
100.0000 mL | Freq: Once | INTRAVENOUS | Status: AC | PRN
  Administered 2014-11-09: 100 mL via INTRAVENOUS

## 2014-11-09 MED ORDER — FUROSEMIDE 10 MG/ML IJ SOLN
20.0000 mg | Freq: Once | INTRAMUSCULAR | Status: AC
  Administered 2014-11-09: 20 mg via INTRAVENOUS
  Filled 2014-11-09: qty 4

## 2014-11-09 MED ORDER — FUROSEMIDE 10 MG/ML IJ SOLN: 40.0000 mg | Freq: Once | INTRAMUSCULAR | Status: DC

## 2014-11-09 NOTE — Discharge Instructions (Signed)
Increase lasix to 1.5 pills daily for 3 days.   See your doctor next week for repeat blood work and to follow up pulmonary nodule.   You may need sleep study.   Return to ER if you have worse shortness of breath, chest pain, leg swelling.

## 2014-11-09 NOTE — ED Notes (Signed)
PT states that her feet started swelling last night. She put them up and woke up at 2am unable to catch her breath. Lung sounds are clear. States that her breathing has improved, but she still gets SOB on exertion.

## 2014-11-09 NOTE — ED Notes (Signed)
Patient transported to X-ray 

## 2014-11-09 NOTE — ED Provider Notes (Signed)
CSN: [id number]     Arrival date & time 11/09/14  2536 History   First MD Initiated Contact with Patient 11/09/14 [phone]     Chief Complaint  Patient presents with  . Shortness of Breath  . Foot Swelling     (Consider location/radiation/quality/duration/timing/severity/associated sxs/prior Treatment) The history is provided by the patient.  [name] is a 54 y.o. female history of hypertension, hyperlipidemia, venous insufficiency here presenting with leg swelling, shortness of breath. She noticed that her legs swelled up yesterday. He tried to put her legs up and then she went to bed. She woke up 2 AM and has trouble catching her breath so she walked around a little bit and he felt better. She went back to bed and woke up again with shortness of breath. She states that her doctor but shouldn't sleep apnea but she didn't have any formal workup before. No history of CHF or CAD. States that she has been taking her Lasix for leg swelling. Denies any wheezing or cough or fevers and no history of COPD. Denies any recent travel or hx of DVT/PE. She did get up today and has some shortness of breath when she talked to the bathroom. Denies any chest pain.     Past Medical History  Diagnosis Date  . Hyperlipidemia   . Arthritis   . Venous insufficiency of leg   . Multiple allergies     takes Zyrtec daily;uses Nasonex daily as needed  . Constipation     takes Colace every other day  . GERD (gastroesophageal reflux disease) 12/06/2011    takes Nexium daily  . Peripheral edema     takes Furosemide daily  . Hypothyroidism 12/06/2011    takes Synthroid daily  . Dry eyes     uses Systane Eye drops daily as needed  . Hypertension     takes Metoprolol and Diovan daily  . History of bronchitis 6-7 yrs ago  . Headache(784.0)     occasionally-d/t congestion  . Congestion of nasal sinus   . Numbness     weakness-right hand  . Joint pain   . Urinary frequency   . Nocturia   . Anemia when  she was young  . Diabetes mellitus without complication (HCC)     borderline  . Pancreatitis    Past Surgical History  Procedure Laterality Date  . Cholecystectomy    . Abdominal hysterectomy      uterine prolapse  . Tubal ligation    . Foot surgery Bilateral   . Back surgery      fusion  . Cardiac catheterization  2007    NORMAL; catheter-induced spasm  . Esophagogastroduodenoscopy  5-49yrs ago  . Colonoscopy    . Shoulder arthroscopy with rotator cuff repair and subacromial decompression Right 08/23/2013    Procedure: RIGHT SHOULDER ARTHROSCOPY WITH  SUBACROMIAL DECOMPRESSION DISTAL CLAVICLE RESECTION AND  ROTATOR CUFF REPAIR ;  Surgeon: Marin Shutter, MD;  Location: [place];  Service: Orthopedics;  Laterality: Right;   Family History  Problem Relation Age of Onset  . Hypertension Mother   . Hypertension Father   . Hypertension Sister   . Kidney disease Sister     [facility] TRANSPLANT  . Thyroid disease Sister   . Breast cancer Maternal Aunt     >50; passed away from it  . Breast cancer Maternal Aunt     >50  . Thyroid disease Sister   . Hypertension Sister    Social History  Substance  Use Topics  . Smoking status: Former Research scientist (life sciences)  . Smokeless tobacco: Never Used     Comment: quit smoking in 1993  . Alcohol Use: Yes     Comment: rarely   OB History    Gravida Para Term Preterm AB TAB SAB Ectopic Multiple Living   2 2        2      Review of Systems  Respiratory: Positive for shortness of breath.   Cardiovascular: Positive for leg swelling.  All other systems reviewed and are negative.     Allergies  Metronidazole; Adhesive; Penicillins; and Vesicare  Home Medications   Prior to Admission medications   Medication Sig Start Date End Date Taking? Authorizing Provider  albuterol (PROVENTIL HFA;VENTOLIN HFA) 108 (90 BASE) MCG/ACT inhaler Inhale 1-2 puffs into the lungs every 6 (six) hours as needed for wheezing or shortness of breath. 10/09/14  Yes Mihai Croitoru,  MD  aspirin EC 81 MG tablet Take 81 mg by mouth every other day.    Yes Historical Provider, MD  cetirizine (ZYRTEC) 10 MG tablet Take 10 mg by mouth daily.   Yes Historical Provider, MD  docusate sodium (COLACE) 100 MG capsule Take 100 mg by mouth daily.    Yes Historical Provider, MD  esomeprazole (NEXIUM) 40 MG capsule Take 40 mg by mouth daily at 12 noon.   Yes Historical Provider, MD  Fish Oil OIL Take 1 capsule by mouth daily.   Yes Historical Provider, MD  furosemide (LASIX) 20 MG tablet Take 1 tablet (20 mg total) by mouth daily. 08/20/14  Yes Mihai Croitoru, MD  gabapentin (NEURONTIN) 300 MG capsule Take 300 mg by mouth 2 (two) times daily.    Yes Historical Provider, MD  HYDROcodone-acetaminophen (NORCO) 10-325 MG per tablet Take 1 tablet by mouth every 6 (six) hours as needed for severe pain.   Yes Historical Provider, MD  levothyroxine (SYNTHROID, LEVOTHROID) 75 MCG tablet Take 75 mcg by mouth daily before breakfast.   Yes Historical Provider, MD  lubiprostone (AMITIZA) 8 MCG capsule Take 8 mcg by mouth daily with breakfast.   Yes Historical Provider, MD  meloxicam (MOBIC) 15 MG tablet Take 15 mg by mouth daily.   Yes Historical Provider, MD  metoprolol tartrate (LOPRESSOR) 25 MG tablet Take 25 mg by mouth 2 (two) times daily.   Yes Historical Provider, MD  mometasone (NASONEX) 50 MCG/ACT nasal spray Place 2 sprays into the nose daily as needed (allergies).   Yes Historical Provider, MD  oxyCODONE-acetaminophen (PERCOCET) 5-325 MG per tablet Take 1-2 tablets by mouth every 4 (four) hours as needed. 08/23/13  Yes Tracy Shuford, PA-C  Pitavastatin Calcium (LIVALO) 2 MG TABS Take 0.5 tablets (1 mg total) by mouth daily. 01/10/13  Yes Mihai Croitoru, MD  Polyethyl Glycol-Propyl Glycol (SYSTANE OP) Place 1 drop into both eyes daily as needed (dry eyes).    Yes Historical Provider, MD  valsartan-hydrochlorothiazide (DIOVAN-HCT) 160-25 MG per tablet Take 1 tablet by mouth daily.   Yes Historical  Provider, MD  diazepam (VALIUM) 5 MG tablet Take 0.5-1 tablets (2.5-5 mg total) by mouth every 6 (six) hours as needed for muscle spasms or sedation. 08/23/13   Olivia Mackie Shuford, PA-C  neomycin-polymyxin-hydrocortisone (CORTISPORIN) otic solution Apply one to two drops to toe after soaking twice daily. Patient not taking: Reported on 11/09/2014 08/21/13   Max T Hyatt, DPM  ondansetron (ZOFRAN) 4 MG tablet Take 1 tablet (4 mg total) by mouth every 8 (eight) hours as needed for nausea  or vomiting. 08/23/13   Jenetta Loges, PA-C  promethazine (PHENERGAN) 25 MG tablet Take 1 tablet (25 mg total) by mouth every 8 (eight) hours as needed for nausea or vomiting. 06/11/14   Dalia Heading, PA-C   BP 115/72 mmHg  Pulse 63  Temp(Src) 98 F (36.7 C) (Oral)  Resp 20  Ht 5\' 1"  (1.549 m)  Wt 280 lb (127.007 kg)  BMI 52.93 kg/m2  SpO2 97% Physical Exam  Constitutional: She is oriented to person, place, and time. She appears well-developed and well-nourished.  overweight  HENT:  Head: Normocephalic.  Eyes: Conjunctivae are normal. Pupils are equal, round, and reactive to light.  Neck: Neck supple.  Cardiovascular: Normal rate, regular rhythm and normal heart sounds.   Pulmonary/Chest: Effort normal.  Diminished bilateral bases, no wheezing   Abdominal: Soft. Bowel sounds are normal. She exhibits no distension. There is no tenderness. There is no rebound.  Musculoskeletal:  1+ edema bilateral legs, no calf tenderness   Neurological: She is alert and oriented to person, place, and time.  Skin: Skin is warm and dry.  Psychiatric: She has a normal mood and affect. Her behavior is normal. Judgment and thought content normal.  Nursing note and vitals reviewed.   ED Course  Procedures (including critical care time) Labs Review Labs Reviewed  COMPREHENSIVE METABOLIC PANEL - Abnormal; Notable for the following:    Glucose, Bld 108 (*)    Calcium 8.8 (*)    All other components within normal limits   BRAIN NATRIURETIC PEPTIDE - Abnormal; Notable for the following:    B Natriuretic Peptide 104.3 (*)    All other components within normal limits  CBC WITH DIFFERENTIAL/PLATELET  I-STAT TROPOININ, ED  Randolm Idol, ED    Imaging Review Dg Chest 2 View  11/09/2014  CLINICAL DATA:  54 year old female with shortness of breath since yesterday evening, with intermittent mid chest pain. EXAM: CHEST  2 VIEW COMPARISON:  Chest x-ray 08/10/2013. FINDINGS: Mild focal prominence of interstitial markings in the left lower lobe new compared to the prior examination. Lungs otherwise appear clear. No pleural effusions. No evidence of pulmonary edema. Heart size is normal. Upper mediastinal contours are within normal limits. Atherosclerotic calcifications in the thoracic aorta. IMPRESSION: 1. Interval development of interstitial prominence in the region of the left lower lobe, concerning for bronchopneumonia. Followup PA and lateral chest X-ray is recommended in 3-4 weeks following trial of antibiotic therapy to ensure resolution and exclude underlying malignancy. 2. Atherosclerosis. Electronically Signed   By: Vinnie Langton M.D.   On: 11/09/2014 09:24   Ct Angio Chest Pe W/cm &/or Wo Cm  11/09/2014  CLINICAL DATA:  Difficulty breathing. Shortness of breath with exertion. Lower extremity swelling. Evaluate for pulmonary embolism. EXAM: CT ANGIOGRAPHY CHEST WITH CONTRAST TECHNIQUE: Multidetector CT imaging of the chest was performed using the standard protocol during bolus administration of intravenous contrast. Multiplanar CT image reconstructions and MIPs were obtained to evaluate the vascular anatomy. CONTRAST:  170mL OMNIPAQUE IOHEXOL 350 MG/ML SOLN COMPARISON:  Chest radiograph-earlier same day FINDINGS: Vascular Findings: There is suboptimal opacification of the pulmonary arterial system with the main pulmonary artery measuring only 162 Hounsfield units. Given this limitation, there are no discrete  filling defects within the central pulmonary arterial tree to the level of the bilateral subsegmental pulmonary arteries. Evaluation of the distal subsegmental pulmonary arteries is degraded secondary to suboptimal vessel opacification. Normal caliber of the main pulmonary artery. Borderline cardiomegaly. Minimal coronary artery calcifications. There is a small  amount of fluid seen within the pericardial recess. No pericardial effusion. No definite thoracic aortic dissection on this nongated examination. Conventional configuration of the aortic arch. Review of the MIP images confirms the above findings. ---------------------------------------------------------------------------------- Nonvascular Findings: There is minimal dependent subpleural ground-glass atelectasis. No focal airspace opacities. No pleural effusion or pneumothorax. The central pulmonary airways appear widely patent. There is a punctate (approximately 2 mm) noncalcified nodule within the right middle lobe (image 47, series 7). Scattered mediastinal lymph nodes individually not enlarged by size criteria with index precarinal lymph node measuring approximately 0.8 cm in greatest short axis diameter (image 30, series 4). No mediastinal, hilar or axillary lymphadenopathy. Limited early arterial phase evaluation of the upper abdomen demonstrates sequela of prior cholecystectomy. No acute or aggressive osseous abnormalities. Incidental note is made of a approximately 1.1 x 1.1 cm hemangioma within in the T8 vertebral body (sagittal image 91, series 9, axial image 51, series 4) without associated vertebral body height loss. Regional soft tissues appear normal. Imaged portions of the thyroid gland are normal. IMPRESSION: 1. No acute cardiopulmonary disease. Specifically, no evidence of pulmonary embolism to the level of the bilateral subsegmental pulmonary arteries. 2. Borderline cardiomegaly. 3. Coronary artery calcifications. 4. Indeterminate punctate  (approximately 2 mm) right middle lobe pulmonary nodule. If the patient is at high risk for bronchogenic carcinoma, follow-up chest CT at 1 year is recommended. If the patient is at low risk, no follow-up is needed. This recommendation follows the consensus statement: Guidelines for Management of Small Pulmonary Nodules Detected on CT Scans: A Statement from the [place] as published in Radiology 2005; 237:395-400. Electronically Signed   By: Sandi Mariscal M.D.   On: 11/09/2014 10:42   I have personally reviewed and evaluated these images and lab results as part of my medical decision-making.   EKG Interpretation   Date/Time:  Saturday November 09 2014 08:47:05 EDT Ventricular Rate:  67 PR Interval:  162 QRS Duration: 84 QT Interval:  424 QTC Calculation: 448 R Axis:   56 Text Interpretation:  Sinus rhythm No significant change since last  tracing Confirmed by YAO  MD, DAVID ([id number]) on 11/09/2014 8:50:47 AM      MDM   Final diagnoses:  None   [name] is a 54 y.o. female here with leg swelling, shortness of breath with exertion. Consider sleep apnea vs new onset CHF vs ACS. Will get labs, BNP, CXR, trop x 2. I doubt PE. Will reassess.   11:57 AM CXr showed possible pneumonia but patient has no cough or fever and WBC nl. CT anigo done to r/o wedge infarct and showed no large PE. Not tachy or hypotensive or hypoxic. Does show incidental pulmonary nodule that can be followed up outpatient. Never hypoxic. BNP 100. Trop x 2 neg. I think shortness of breath likely from sleep apnea vs venous stasis. Given lasix 20 mg IV today, will increase lasix to 30 mg daily (taking 20 mg daily currently) for 3 days. Will have her see PCP to get sleep study.   Wandra Arthurs, MD 11/09/14 [phone]

## 2014-11-11 DIAGNOSIS — R918 Other nonspecific abnormal finding of lung field: Secondary | ICD-10-CM | POA: Insufficient documentation

## 2014-11-12 ENCOUNTER — Encounter: Payer: Self-pay | Admitting: Rehabilitation

## 2014-11-12 ENCOUNTER — Ambulatory Visit: Payer: 59 | Attending: Physical Medicine and Rehabilitation | Admitting: Rehabilitation

## 2014-11-12 DIAGNOSIS — M545 Low back pain: Secondary | ICD-10-CM | POA: Insufficient documentation

## 2014-11-12 DIAGNOSIS — G8929 Other chronic pain: Secondary | ICD-10-CM | POA: Insufficient documentation

## 2014-11-12 NOTE — Therapy (Signed)
[facility] [address] [place], [place], [address] Phone: [phone]   Fax:  [phone]  Physical Therapy Evaluation  Patient Details  Name: [name] MRN: [id number] Date of Birth: [birth year] Referring Provider: frederic newton  Encounter Date: 11/12/2014      PT End of Session - 11/12/14 1706    Visit Number 1   Number of Visits 5   Date for PT Re-Evaluation 11/26/14   Authorization Type MD requesting only 2 weeks   PT Start Time 1937   PT Stop Time 9024   PT Time Calculation (min) 43 min   Activity Tolerance Patient tolerated treatment well      Past Medical History  Diagnosis Date  . Hyperlipidemia   . Arthritis   . Venous insufficiency of leg   . Multiple allergies     takes Zyrtec daily;uses Nasonex daily as needed  . Constipation     takes Colace every other day  . GERD (gastroesophageal reflux disease) 12/06/2011    takes Nexium daily  . Peripheral edema     takes Furosemide daily  . Hypothyroidism 12/06/2011    takes Synthroid daily  . Dry eyes     uses Systane Eye drops daily as needed  . Hypertension     takes Metoprolol and Diovan daily  . History of bronchitis 6-7 yrs ago  . Headache(784.0)     occasionally-d/t congestion  . Congestion of nasal sinus   . Numbness     weakness-right hand  . Joint pain   . Urinary frequency   . Nocturia   . Anemia when she was young  . Diabetes mellitus without complication (HCC)     borderline  . Pancreatitis     Past Surgical History  Procedure Laterality Date  . Cholecystectomy    . Abdominal hysterectomy      uterine prolapse  . Tubal ligation    . Foot surgery Bilateral   . Back surgery      fusion  . Cardiac catheterization  2007    NORMAL; catheter-induced spasm  . Esophagogastroduodenoscopy  5-46yrs ago  . Colonoscopy    . Shoulder arthroscopy with rotator cuff repair and subacromial decompression Right 08/23/2013   Procedure: RIGHT SHOULDER ARTHROSCOPY WITH  SUBACROMIAL DECOMPRESSION DISTAL CLAVICLE RESECTION AND  ROTATOR CUFF REPAIR ;  Surgeon: Marin Shutter, MD;  Location: [facility];  Service: Orthopedics;  Laterality: Right;    There were no vitals filed for this visit.  Visit Diagnosis:  Chronic bilateral low back pain without sciatica - Plan: PT plan of care cert/re-cert      Subjective Assessment - 11/12/14 1537    Subjective Pt presents with low back pain, glute pain and posterior thigh pain due to DDD/OA.  Pt receives injections (3) did not help at all.  Will be getitng a nerve ablation procedure after therapy (PT needed to apply for this procedure).  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  Has trouble standing and doing any work activity.  The back also limits housework.  No numbness and tingling.     Pertinent History laminectomy in 2009 for stenosis which helped for the LE symptoms but not the back pain   Limitations Standing;Walking   How long can you stand comfortably? 70min   How long can you walk comfortably? 20-68min   Diagnostic tests none pt can recall   Patient Stated Goals decrease pain;  avoid nerve ablation if possible.     Currently in Pain? Yes   Pain Score --  0 at rest currently; up to 10/10 with bad weather and 5/10 recently usually with walking and standing still   Pain Location Back   Pain Orientation Left;Right   Pain Descriptors / Indicators Aching;Burning   Pain Type Chronic pain   Pain Onset --  2006   Pain Frequency Intermittent   Aggravating Factors  walking, standing   Pain Relieving Factors rest, using recliner   Effect of Pain on Daily Activities see subjective            [facility] PT Assessment - 11/12/14 0001    Assessment   Medical Diagnosis low back pain   Referring Provider frederic newton   Onset Date/Surgical Date 01/26/04   Next MD Visit 2-3 weeks   Prior Therapy no   Precautions   Precautions None    Restrictions   Weight Bearing Restrictions No   Balance Screen   Has the patient fallen in the past 6 months No   Has the patient had a decrease in activity level because of a fear of falling?  No   Is the patient reluctant to leave their home because of a fear of falling?  No   Prior Function   Level of Independence Independent   Observation/Other Assessments   Focus on Therapeutic Outcomes (FOTO)  46%   Posture/Postural Control   Posture Comments increased lumbar lordosis; feels better wtih hooklying supine   ROM / Strength   AROM / PROM / Strength AROM;Strength   AROM   AROM Assessment Site Lumbar   Lumbar Flexion fingers 4" from floor; "feels good" no reversal of lordosis   Lumbar Extension 50%   Lumbar - Right Side Bend fingers 4" from knee   Lumbar - Left Side Bend fingers 5" from knee   Lumbar - Right Rotation 75%   Lumbar - Left Rotation 75% with pain R   Strength   Overall Strength Comments seated knee and ankle strength WNL   Strength Assessment Site Hip;Knee;Ankle   Right/Left Hip Right;Left   Right Hip Flexion --  painful to lift   Right Hip External Rotation  4/5   Right Hip Internal Rotation 4/5   Right Hip ABduction 4/5   Right Hip ADduction 4/5   Left Hip Flexion --  painful to lift   Left Hip External Rotation 4/5   Left Hip Internal Rotation 4/5   Left Hip ABduction 4/5   Left Hip ADduction 4/5   Flexibility   Soft Tissue Assessment /Muscle Length yes   Hamstrings to 100 bil   Piriformis tightness bilaterally regular and KTOS positions                           PT Education - 11/12/14 1706    Education provided Yes   Education Details Diagnosis, POC, spinal neutral positioning in supine, HEP   Person(s) Educated Patient   Methods Explanation;Demonstration;Handout   Comprehension Verbalized understanding;Returned demonstration             PT Long Term Goals - 11/12/14 1720    PT LONG TERM GOAL #1   Title pt will be  independent with HEP   Time 2   Period Weeks   Status New   PT LONG TERM GOAL #2   Title pt will demonstrate proper supine to sit tranfer   Time 2  Period Weeks   PT LONG TERM GOAL #3   Title pt will have an understanding of TrA activation in seated and standing   Time 2   Period Weeks   Status New               Plan - 11/12/14 1709    Clinical Impression Statement Pt presents with chronic back, buttock, and posterior thigh pain due to OA and increased lumbar lordosis.  Pt has minimal pain at rest/seated, but has an increase in pain upon standing and walking.  She would like to get a nerve ablation but has to complete 2 weeks of therapy before it is approved.  Also reports that she is unable to afford any more of that.  Pt would benefit from spinal neutral  postural modifications, glute and posterior thigh stretching, core strengthening, and ADL education   Pt will benefit from skilled therapeutic intervention in order to improve on the following deficits Pain;Decreased activity tolerance;Improper body mechanics;Impaired flexibility   Rehab Potential Good   PT Frequency 2x / week   PT Duration 2 weeks  MD referral   PT Treatment/Interventions ADLs/Self Care Home Management;Therapeutic activities;Therapeutic exercise;Neuromuscular re-education   PT Next Visit Plan review supine spinal netural, and stretches per instruction section, begin core and ADL retraining.  Manual stretching of the glutes/piriformis due to pt unable to do indpendently   Consulted and Agree with Plan of Care Patient         Problem List Patient Active Problem List   Diagnosis Date Noted  . Morbid obesity ([place]) 10/09/2014  . Pulmonary HTN ([place]) 05/12/2013  . Bilateral lower extremity edema 05/12/2013  . Morbid obesity with BMI of 50.0-59.9, adult ([place])   . Back pain, hx of prior back surgery 12/09/2011  . Pancreatitis, mild, CT negative 12/08/2011  . Fatty liver 12/08/2011  . Substernal chest  pain, suspected secondary to acute pancreatitis 12/06/2011  . Hypertension 12/06/2011  . Hypokalemia 12/06/2011  . Hypothyroidism 12/06/2011  . GERD (gastroesophageal reflux disease) 12/06/2011  . Anxiety 12/06/2011  . BV (bacterial vaginosis)   . Hyperlipidemia     Stark Bray , DPT< CMP 11/12/2014, 5:23 PM  [facility] [address] [place], [place], [address] Phone: [phone]   Fax:  [phone]  Name: [name] MRN: [id number] Date of Birth: 12-Apr-1960

## 2014-11-14 ENCOUNTER — Ambulatory Visit: Payer: 59 | Admitting: Physical Therapy

## 2014-11-14 ENCOUNTER — Encounter: Payer: Self-pay | Admitting: Physical Therapy

## 2014-11-14 DIAGNOSIS — M545 Low back pain: Secondary | ICD-10-CM | POA: Diagnosis not present

## 2014-11-14 DIAGNOSIS — G8929 Other chronic pain: Secondary | ICD-10-CM

## 2014-11-14 NOTE — Patient Instructions (Signed)
 Lifting Principles  .Maintain proper posture and head alignment. .Slide object as close as possible before lifting. .Move obstacles out of the way. .Test before lifting; ask for help if too heavy. .Tighten stomach muscles without holding breath. .Use smooth movements; do not jerk. .Use legs to do the work, and pivot with feet. .Distribute the work load symmetrically and close to the center of trunk. .Push instead of pull whenever possible.   Squat down and hold basket close to stand. Use leg muscles to do the work.    Avoid twisting or bending back. Pivot around using foot movements, and bend at knees if needed when reaching for articles.        Getting Into / Out of Bed   Lower self to lie down on one side by raising legs and lowering head at the same time. Use arms to assist moving without twisting. Bend both knees to roll onto back if desired. To sit up, start from lying on side, and use same move-ments in reverse. Keep trunk aligned with legs.    Shift weight from front foot to back foot as item is lifted off shelf.    When leaning forward to pick object up from floor, extend one leg out behind. Keep back straight. Hold onto a sturdy support with other hand.      Sit upright, head facing forward. Try using a roll to support lower back. Keep shoulders relaxed, and avoid rounded back. Keep hips level with knees. Avoid crossing legs for long periods.    Lower abdominal/core stability exercises  1. Practice your breathing technique: Inhale through your nose expanding your belly and rib cage. Try not to breathe into your chest. Exhale slowly and gradually out your mouth feeling a sense of softness to your body. Practice multiple times. This can be performed unlimited.  2. Finding the lower abdominals. Laying on your back with the knees bent, place your fingers just below your belly button. Using your breathing technique from above, on your exhale gently pull the  belly button away from your fingertips without tensing any other muscles. Practice this 5x. Next, as you exhale, draw belly button inwards and hold onto it...then feel as if you are pulling that muscle across your pelvis like you are tightening a belt. This can be hard to do at first so be patient and practice. Do 5-10 reps 1-3 x day. Always recognize quality over quantity; if your abdominal muscles become tired you will notice you may tighten/contract other muscles. This is the time to take a break.   Practice this first laying on your back, then in sitting, progressing to standing and finally adding it to all your daily movements.   3. Finding your pelvic floor. Using the breathing technique above, when your exhale, this time draw your pelvic floor muscles up as if you were attempting to stop the flow of urination. Be careful NOT to tense any other muscles. This can be hard, BE PATIENT. Try to hold up to 10 seconds repeating 10x. Try 2x a day. Once you feel you are doing this well, add this contraction to exercise #2. First contracting your pelvic floor followed by lower abdominals.  4. Adding leg movements. Add the following leg movements to challenge your ability to keep your core stable:  1. Single leg drop outs: Laying on your back with knees bent feet flat. Inhale,  dropping one knee outward KEEPING YOUR PELVIS STILL. Exhale as you bring the leg back, simultaneously performing   your lower abdominal contraction. Do 5-10 on each leg.  2. Marching: While keeping your pelvis still, lift the right foot a few inches, put it down then lift left foot. This will mimic a march. Start slow to establish control. Once you have control you may speed it up. Do 10-20x. You MUST keep your lower abdominlas contracted while you march. Breathe naturally   3. Single leg slides: Inhale while you slowly slide one leg out keeping your pelvis still. Only slide your leg as far as you can keep your pelvis still. Exhale as you  bring the leg back to the start, contracting the lower abdominals as you do that. Keep your upper body relaxed. Do 5-10 on each side. 

## 2014-11-14 NOTE — Therapy (Signed)
[facility] [address] [place], [place], [address] Phone: [phone]   Fax:  [phone]  Physical Therapy Treatment  Patient Details  Name: [name] MRN: [id number] Date of Birth: 11/10/1960 Referring Provider: frederic newton  Encounter Date: 11/14/2014      PT End of Session - 11/14/14 1558    Visit Number 2   Number of Visits 5   Date for PT Re-Evaluation 11/26/14   Authorization Type MD requesting only 2 weeks   PT Start Time 1530   PT Stop Time 1615   PT Time Calculation (min) 45 min   Activity Tolerance Patient tolerated treatment well   Behavior During Therapy [facility] for tasks assessed/performed      Past Medical History  Diagnosis Date  . Hyperlipidemia   . Arthritis   . Venous insufficiency of leg   . Multiple allergies     takes Zyrtec daily;uses Nasonex daily as needed  . Constipation     takes Colace every other day  . GERD (gastroesophageal reflux disease) 12/06/2011    takes Nexium daily  . Peripheral edema     takes Furosemide daily  . Hypothyroidism 12/06/2011    takes Synthroid daily  . Dry eyes     uses Systane Eye drops daily as needed  . Hypertension     takes Metoprolol and Diovan daily  . History of bronchitis 6-7 yrs ago  . Headache(784.0)     occasionally-d/t congestion  . Congestion of nasal sinus   . Numbness     weakness-right hand  . Joint pain   . Urinary frequency   . Nocturia   . Anemia when she was young  . Diabetes mellitus without complication (HCC)     borderline  . Pancreatitis     Past Surgical History  Procedure Laterality Date  . Cholecystectomy    . Abdominal hysterectomy      uterine prolapse  . Tubal ligation    . Foot surgery Bilateral   . Back surgery      fusion  . Cardiac catheterization  2007    NORMAL; catheter-induced spasm  . Esophagogastroduodenoscopy  5-47yrs ago  . Colonoscopy    . Shoulder arthroscopy with rotator  cuff repair and subacromial decompression Right 08/23/2013    Procedure: RIGHT SHOULDER ARTHROSCOPY WITH  SUBACROMIAL DECOMPRESSION DISTAL CLAVICLE RESECTION AND  ROTATOR CUFF REPAIR ;  Surgeon: Marin Shutter, MD;  Location: [facility];  Service: Orthopedics;  Laterality: Right;    There were no vitals filed for this visit.  Visit Diagnosis:  Chronic bilateral low back pain without sciatica      Subjective Assessment - 11/14/14 1549    Subjective Pt reports pain in low back and bil hips & post thights is rated as 4-5/10,     Currently in Pain? Yes   Pain Score 5    Pain Location Back   Pain Orientation Right;Left   Pain Descriptors / Indicators Aching;Burning   Pain Type Chronic pain   Pain Onset More than a month ago   Pain Frequency Intermittent   Multiple Pain Sites No                         [facility] Adult PT Treatment/Exercise - 11/14/14 0001    Therapeutic Activites    Therapeutic Activities ADL's  practiced going in and out of bed   Exercises   Exercises Lumbar;Knee/Hip   Lumbar Exercises:  Stretches   Active Hamstring Stretch 2 reps;30 seconds  in supine using towel each leg   Piriformis Stretch 3 reps;20 seconds  in supine using towel each leg   Lumbar Exercises: Supine   Other Supine Lumbar Exercises TA activation baseline and ER, leg lenthening and marching  x 3 each activitiy with 3 reps   Knee/Hip Exercises: Aerobic   Nustep L2 x 82min seat #(, arms #10                     PT Long Term Goals - 11/12/14 1720    PT LONG TERM GOAL #1   Title pt will be independent with HEP   Time 2   Period Weeks   Status New   PT LONG TERM GOAL #2   Title pt will demonstrate proper supine to sit tranfer   Time 2   Period Weeks   PT LONG TERM GOAL #3   Title pt will have an understanding of TrA activation in seated and standing   Time 2   Period Weeks   Status New               Plan - 11/14/14 1600    Clinical Impression Statement Pt  with good performance with stretching exercises and understanding of TA activation. Pt will continue to benefit from skilled PT for gluteal and posterior tight stretching, core strength. and review of posture.   Pt will benefit from skilled therapeutic intervention in order to improve on the following deficits Pain;Decreased activity tolerance;Improper body mechanics;Impaired flexibility   Rehab Potential Good   PT Frequency 2x / week   PT Duration 2 weeks   PT Treatment/Interventions ADLs/Self Care Home Management;Therapeutic activities;Therapeutic exercise;Neuromuscular re-education   PT Next Visit Plan Review TA activation, add hamstring and piriformis stretch to HEP. Manual to gluteus and piriiformis    Consulted and Agree with Plan of Care Patient        Problem List Patient Active Problem List   Diagnosis Date Noted  . Morbid obesity ([place]) 10/09/2014  . Pulmonary HTN ([place]) 05/12/2013  . Bilateral lower extremity edema 05/12/2013  . Morbid obesity with BMI of 50.0-59.9, adult ([place])   . Back pain, hx of prior back surgery 12/09/2011  . Pancreatitis, mild, CT negative 12/08/2011  . Fatty liver 12/08/2011  . Substernal chest pain, suspected secondary to acute pancreatitis 12/06/2011  . Hypertension 12/06/2011  . Hypokalemia 12/06/2011  . Hypothyroidism 12/06/2011  . GERD (gastroesophageal reflux disease) 12/06/2011  . Anxiety 12/06/2011  . BV (bacterial vaginosis)   . Hyperlipidemia     NAUMANN-HOUEGNIFIO,Andree Golphin PTA 11/14/2014, 4:16 PM  [facility] [address] [place], [place], [address] Phone: [phone]   Fax:  [phone]  Name: KHAMANI DANIELY MRN: [id number] Date of Birth: [birth year]

## 2014-11-18 ENCOUNTER — Ambulatory Visit: Payer: 59 | Admitting: Physical Therapy

## 2014-11-18 ENCOUNTER — Encounter: Payer: Self-pay | Admitting: Physical Therapy

## 2014-11-18 DIAGNOSIS — M545 Low back pain, unspecified: Secondary | ICD-10-CM

## 2014-11-18 DIAGNOSIS — G8929 Other chronic pain: Secondary | ICD-10-CM

## 2014-11-18 NOTE — Patient Instructions (Signed)
  HAMSTRING STRETCH WITH TOWEL  While lying down on your back, hook a towel or strap under  your foot and draw up your leg until a stretch is felt under your leg. calf area. Keep your knee straight (extented)  Keep your knee in a straightened position during the stretch.  Hold 20 seconds, perform 3 repetitions.  3 times a day.    [facility] [address], [place], [place] [address] Phone # [phone] Fax [phone]  Piriformis (Supine)  Cross legs, right on top. Gently pull other knee toward chest until stretch is felt in buttock/hip of top leg. Hold  20 seconds. Repeat _ 3 times per set. Do __2-3__ sets per session. Do  2-3 sessions per day.  Hip Stretch  Put right ankle over left knee. Let right knee fall downward, but keep ankle in place. Feel the stretch in hip. May push down gently with hand to feel stretch. Hold _20___ seconds while counting out loud. Repeat with other leg. Repeat 3____ times. Do _3___ sessions per day.

## 2014-11-18 NOTE — Therapy (Signed)
[facility] [address] [place], [place], [address] Phone: [phone]   Fax:  [phone]  Physical Therapy Treatment  Patient Details  Name: [name] MRN: [id number] Date of Birth: [birth year] Referring Provider: frederic newton  Encounter Date: 11/18/2014      PT End of Session - 11/18/14 1635    Visit Number 3   Number of Visits 5   Date for PT Re-Evaluation 11/26/14   Authorization Type MD requesting only 2 weeks   PT Start Time 1622   PT Stop Time 1157   PT Time Calculation (min) 36 min   Activity Tolerance Patient tolerated treatment well   Behavior During Therapy [facility] for tasks assessed/performed      Past Medical History  Diagnosis Date  . Hyperlipidemia   . Arthritis   . Venous insufficiency of leg   . Multiple allergies     takes Zyrtec daily;uses Nasonex daily as needed  . Constipation     takes Colace every other day  . GERD (gastroesophageal reflux disease) 12/06/2011    takes Nexium daily  . Peripheral edema     takes Furosemide daily  . Hypothyroidism 12/06/2011    takes Synthroid daily  . Dry eyes     uses Systane Eye drops daily as needed  . Hypertension     takes Metoprolol and Diovan daily  . History of bronchitis 6-7 yrs ago  . Headache(784.0)     occasionally-d/t congestion  . Congestion of nasal sinus   . Numbness     weakness-right hand  . Joint pain   . Urinary frequency   . Nocturia   . Anemia when she was young  . Diabetes mellitus without complication (HCC)     borderline  . Pancreatitis     Past Surgical History  Procedure Laterality Date  . Cholecystectomy    . Abdominal hysterectomy      uterine prolapse  . Tubal ligation    . Foot surgery Bilateral   . Back surgery      fusion  . Cardiac catheterization  2007    NORMAL; catheter-induced spasm  . Esophagogastroduodenoscopy  5-4yrs ago  . Colonoscopy    . Shoulder arthroscopy with rotator  cuff repair and subacromial decompression Right 08/23/2013    Procedure: RIGHT SHOULDER ARTHROSCOPY WITH  SUBACROMIAL DECOMPRESSION DISTAL CLAVICLE RESECTION AND  ROTATOR CUFF REPAIR ;  Surgeon: Marin Shutter, MD;  Location: [place];  Service: Orthopedics;  Laterality: Right;    There were no vitals filed for this visit.  Visit Diagnosis:  Chronic bilateral low back pain without sciatica      Subjective Assessment - 11/18/14 1632    Subjective Pt reports pain as a 3-4/10 in low back and bil hips and post thights   Currently in Pain? Yes   Pain Score 4    Pain Location Back   Pain Orientation Right;Left   Pain Descriptors / Indicators Aching;Burning   Pain Type Chronic pain   Pain Onset More than a month ago   Pain Frequency Intermittent   Multiple Pain Sites No                         [facility] Adult PT Treatment/Exercise - 11/18/14 0001    Exercises   Exercises Lumbar;Knee/Hip   Lumbar Exercises: Stretches   Active Hamstring Stretch 3 reps;20 seconds  sitting, and supine using towel   Piriformis Stretch 3 reps;20  seconds  using towel on each leg in supine, practiced in sitting   Lumbar Exercises: Supine   Other Supine Lumbar Exercises TA activation x10 with 5 sec hold ER, leg lenthening and marching   Knee/Hip Exercises: Aerobic   Nustep --                PT Education - 11/18/14 1654    Education provided Yes   Education Details hamstring in supine, piriformis in supine and sitting   Person(s) Educated Patient   Methods Explanation;Demonstration;Handout   Comprehension Verbalized understanding;Returned demonstration             PT Long Term Goals - 11/18/14 1642    PT LONG TERM GOAL #1   Title pt will be independent with HEP   Time 2   Period Weeks   Status On-going   PT LONG TERM GOAL #2   Title pt will demonstrate proper supine to sit tranfer   Time 2   Period Weeks   Status Achieved   PT LONG TERM GOAL #3   Title pt will have an  understanding of TrA activation in seated and standing   Time 2   Period Weeks   Status On-going               Plan - 11/18/14 1637    Clinical Impression Statement Pt with good performance of stretching exercises. She will have one more PT visit per MD recommendation.    Pt will benefit from skilled therapeutic intervention in order to improve on the following deficits Pain;Decreased activity tolerance;Improper body mechanics;Impaired flexibility   Rehab Potential Good   PT Frequency 2x / week   PT Duration 2 weeks   PT Treatment/Interventions ADLs/Self Care Home Management;Therapeutic activities;Therapeutic exercise;Neuromuscular re-education   PT Next Visit Plan D/C to HEP, review TA and stretching exercises   Consulted and Agree with Plan of Care Patient        Problem List Patient Active Problem List   Diagnosis Date Noted  . Morbid obesity ([place]) 10/09/2014  . Pulmonary HTN ([place]) 05/12/2013  . Bilateral lower extremity edema 05/12/2013  . Morbid obesity with BMI of 50.0-59.9, adult ([place])   . Back pain, hx of prior back surgery 12/09/2011  . Pancreatitis, mild, CT negative 12/08/2011  . Fatty liver 12/08/2011  . Substernal chest pain, suspected secondary to acute pancreatitis 12/06/2011  . Hypertension 12/06/2011  . Hypokalemia 12/06/2011  . Hypothyroidism 12/06/2011  . GERD (gastroesophageal reflux disease) 12/06/2011  . Anxiety 12/06/2011  . BV (bacterial vaginosis)   . Hyperlipidemia     NAUMANN-HOUEGNIFIO,Takoda Janowiak PTA 11/18/2014, 5:01 PM  [facility] [address] [place], [place], [address] Phone: [phone]   Fax:  [phone]  Name: KESHANA KLEMZ MRN: [id number] Date of Birth: December 04, 1960

## 2014-11-21 ENCOUNTER — Encounter: Payer: Self-pay | Admitting: Physical Therapy

## 2014-11-21 ENCOUNTER — Ambulatory Visit: Payer: 59 | Admitting: Physical Therapy

## 2014-11-21 DIAGNOSIS — M545 Low back pain: Secondary | ICD-10-CM | POA: Diagnosis not present

## 2014-11-21 DIAGNOSIS — G8929 Other chronic pain: Secondary | ICD-10-CM

## 2014-11-21 NOTE — Therapy (Signed)
[facility] [address] [place], [place], [address] Phone: [phone]   Fax:  [phone]  Physical Therapy Treatment  Patient Details  Name: [name] MRN: [id number] Date of Birth: 11/28/1960 Referring Provider: Magnus Sinning  Encounter Date: 11/21/2014      PT End of Session - 11/21/14 1649    Visit Number 4   Date for PT Re-Evaluation 11/26/14   Authorization Type MD requesting only 2 weeks   PT Start Time 1625   PT Stop Time 1700   PT Time Calculation (min) 35 min   Activity Tolerance Patient tolerated treatment well   Behavior During Therapy [facility] for tasks assessed/performed      Past Medical History  Diagnosis Date  . Hyperlipidemia   . Arthritis   . Venous insufficiency of leg   . Multiple allergies     takes Zyrtec daily;uses Nasonex daily as needed  . Constipation     takes Colace every other day  . GERD (gastroesophageal reflux disease) 12/06/2011    takes Nexium daily  . Peripheral edema     takes Furosemide daily  . Hypothyroidism 12/06/2011    takes Synthroid daily  . Dry eyes     uses Systane Eye drops daily as needed  . Hypertension     takes Metoprolol and Diovan daily  . History of bronchitis 6-7 yrs ago  . Headache(784.0)     occasionally-d/t congestion  . Congestion of nasal sinus   . Numbness     weakness-right hand  . Joint pain   . Urinary frequency   . Nocturia   . Anemia when she was young  . Diabetes mellitus without complication (HCC)     borderline  . Pancreatitis     Past Surgical History  Procedure Laterality Date  . Cholecystectomy    . Abdominal hysterectomy      uterine prolapse  . Tubal ligation    . Foot surgery Bilateral   . Back surgery      fusion  . Cardiac catheterization  2007    NORMAL; catheter-induced spasm  . Esophagogastroduodenoscopy  5-105yr ago  . Colonoscopy    . Shoulder arthroscopy with rotator cuff repair and  subacromial decompression Right 08/23/2013    Procedure: RIGHT SHOULDER ARTHROSCOPY WITH  SUBACROMIAL DECOMPRESSION DISTAL CLAVICLE RESECTION AND  ROTATOR CUFF REPAIR ;  Surgeon: KMarin Shutter MD;  [place] [facility] [facility]  Service: Orthopedics;  Laterality: Right;    There were no vitals filed for this visit.  Visit Diagnosis:  Chronic bilateral low back pain without sciatica      Subjective Assessment - 11/21/14 1644    Subjective Patient reports increased pain in right piriformis 2 days ago.  Patient reports her pain is getting worse.  Patient reports she had 3 injections with no relief.  Patient will be seeing the MD for follow up.    Pertinent History laminectomy in 2009 for stenosis which helped for the LE symptoms but not the back pain   Limitations Standing;Walking   How long can you walk comfortably? 20-384m   Diagnostic tests none pt can recall   Patient Stated Goals decrease pain; avoid nerve ablation if possible.     Currently in Pain? Yes   Pain Score 10-Worst pain ever   Pain Location Groin   Pain Orientation Right   Pain Descriptors / Indicators Aching   Pain Type Chronic pain   Pain Onset More than a month ago  Pain Frequency Constant   Aggravating Factors  walking, sitting, standing   Pain Relieving Factors Nothing helps at this time   Multiple Pain Sites No            [facility] PT Assessment - 11/21/14 0001    Assessment   Medical Diagnosis low back pain   Referring Provider Haze Rushing newton   Onset Date/Surgical Date 01/26/04   Next MD Visit 2-3 weeks   Prior Therapy no   Precautions   Precautions None   Restrictions   Weight Bearing Restrictions No   Balance Screen   Has the patient fallen in the past 6 months No   Has the patient had a decrease in activity level because of a fear of falling?  No   Is the patient reluctant to leave their home because of a fear of falling?  No   Prior Function   Level of Independence Independent   Observation/Other  Assessments   Focus on Therapeutic Outcomes (FOTO)  59% limitation   Posture/Postural Control   Posture Comments increased lumbar lordosis; feels better wtih hooklying supine   ROM / Strength   AROM / PROM / Strength AROM   AROM   AROM Assessment Site Lumbar   Lumbar Flexion fingers 4" from floor; "feels good" no reversal of lordosis   Lumbar Extension 50%   Lumbar - Right Side Bend fingers 4" from knee   Lumbar - Left Side Bend fingers 5" from knee   Lumbar - Right Rotation 50%   Lumbar - Left Rotation 50%   Strength   Overall Strength Comments seated knee and ankle strength WNL   Strength Assessment Site Hip;Knee   Right/Left Hip Right;Left   Right Hip External Rotation  --  unable to test today due to pain in Rt groin area   Right Hip Internal Rotation --  see above   Right Hip ABduction --  see above   Right Hip ADduction --  see above   Left Hip ABduction 4/5   Left Hip ADduction 4/5                     [facility] Adult PT Treatment/Exercise - 11/21/14 0001    Bed Mobility   Bed Mobility Rolling Right;Rolling Left;Supine to Sit  practiced log rolling,difficult due to incr Rt groin pain   Exercises   Exercises Lumbar;Knee/Hip   Lumbar Exercises: Stretches   Piriformis Stretch --  per pt report that seemed to caused pain in Rt groin area   Lumbar Exercises: Supine   Other Supine Lumbar Exercises verbally reviewed TA activation to include into daily activities   Manual Therapy   Manual Therapy Soft tissue mobilization   Soft tissue mobilization to bil. hamstring , gluteal, piriformis in prone  and gentel MFR adductors and quadriceps R LE                     PT Long Term Goals - 11/21/14 1643    PT LONG TERM GOAL #1   Title pt will be independent with HEP   Time 2   Period Weeks   Status Achieved   PT LONG TERM GOAL #2   Title pt will demonstrate proper supine to sit tranfer   Time 2   Period Weeks   Status Achieved   PT LONG TERM GOAL  #3   Title pt will have an understanding of TrA activation in seated and standing   Time 2  Period Weeks   Status Achieved               Plan - 11/21/14 1650    Clinical Impression Statement Patient is a 54 year old female with diagnosis of low back pain.  Patient has tried physical therapy.  Two days ago she had increased back and right groin pain that is constant at level 10/10.  Patient has tried 3 injections with no relief.  Patient has met her goals for physical therapy including on abdominal contraction to stabilize her spine, demonstrating correct body mechanics with sit to  stand and independent with HEP.    Pt will benefit from skilled therapeutic intervention in order to improve on the following deficits Pain;Decreased activity tolerance;Improper body mechanics;Impaired flexibility   Rehab Potential Good   Clinical Impairments Affecting Rehab Potential None   PT Treatment/Interventions ADLs/Self Care Home Management;Therapeutic activities;Therapeutic exercise;Neuromuscular re-education   PT Next Visit Plan D/C to HEP and follow up with MD   PT Home Exercise Plan Current HEP   Recommended Other Services None   Consulted and Agree with Plan of Care Patient        Problem List Patient Active Problem List   Diagnosis Date Noted  . Morbid obesity ([place]) 10/09/2014  . Pulmonary HTN ([place]) 05/12/2013  . Bilateral lower extremity edema 05/12/2013  . Morbid obesity with BMI of 50.0-59.9, adult ([place])   . Back pain, hx of prior back surgery 12/09/2011  . Pancreatitis, mild, CT negative 12/08/2011  . Fatty liver 12/08/2011  . Substernal chest pain, suspected secondary to acute pancreatitis 12/06/2011  . Hypertension 12/06/2011  . Hypokalemia 12/06/2011  . Hypothyroidism 12/06/2011  . GERD (gastroesophageal reflux disease) 12/06/2011  . Anxiety 12/06/2011  . BV (bacterial vaginosis)   . Hyperlipidemia     NAUMANN-HOUEGNIFIO,Lenny Fiumara,PTA 11/21/2014, 5:10 PM  [facility] [address] [place], [place], [address] Phone: [phone]   Fax:  [phone]  Name: COTI BURD MRN: [id number] Date of Birth: 05-18-1960    PHYSICAL THERAPY DISCHARGE SUMMARY  Visits from Start of Care: 4  Current functional level related to goals / functional outcomes: See above.   Remaining deficits: See above.    Education / Equipment: HEP Plan: Patient agrees to discharge.  Patient goals were not met. Patient is being discharged due to lack of progress.Thank you for the referral.   ?????     Earlie Counts, PT 11/21/2014 5:10 PM

## 2014-12-04 ENCOUNTER — Encounter: Payer: Self-pay | Admitting: Neurology

## 2014-12-04 ENCOUNTER — Ambulatory Visit (INDEPENDENT_AMBULATORY_CARE_PROVIDER_SITE_OTHER): Payer: 59 | Admitting: Neurology

## 2014-12-04 VITALS — BP 114/70 | HR 72 | Resp 16 | Ht 63.0 in | Wt 280.0 lb

## 2014-12-04 DIAGNOSIS — R0602 Shortness of breath: Secondary | ICD-10-CM

## 2014-12-04 DIAGNOSIS — R600 Localized edema: Secondary | ICD-10-CM

## 2014-12-04 DIAGNOSIS — R0683 Snoring: Secondary | ICD-10-CM | POA: Diagnosis not present

## 2014-12-04 DIAGNOSIS — R351 Nocturia: Secondary | ICD-10-CM | POA: Diagnosis not present

## 2014-12-04 DIAGNOSIS — G4719 Other hypersomnia: Secondary | ICD-10-CM

## 2014-12-04 DIAGNOSIS — G2581 Restless legs syndrome: Secondary | ICD-10-CM

## 2014-12-04 NOTE — Progress Notes (Signed)
Subjective:    Patient ID: [name] is a 54 y.o. female.  HPI     [name], MD, PhD [facility] [address] [place], [place] [address]  Dear Dr. Ardeth Perfect,   I saw your patient, [name], upon your kind request in my neurologic clinic today for initial consultation of her sleep disorder, in particular, concern for underlying obstructive sleep apnea. The patient is unaccompanied today. 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 I reviewed your office note from 11/12/2014, which you kindly included.  She went to the ER on 11/09/14, after she woke up in the middle of the night a couple with SOB and a sense of choking. She has a family history of obstructive sleep apnea in her older sister who uses a CPAP machine. During the emergency room visit she was noted to have more lower extremity swelling. She was given IV Lasix 1 and her maintenance Lasix was increased to 30 mg from 20 mg daily. She had a chest x-ray and then a CT angiogram of her chest which showed: 1. No acute cardiopulmonary disease. Specifically, no evidence of pulmonary embolism to the level of the bilateral subsegmental pulmonary arteries. 2. Borderline cardiomegaly. 3. Coronary artery calcifications. 4. Indeterminate punctate (approximately 2 mm) right middle lobe pulmonary nodule. If the patient is at high risk for bronchogenic carcinoma, follow-up chest CT at 1 year is recommended. If the patient is at low risk, no follow-up is needed. She reports a bedtime of around 10 PM. She falls asleep quickly. She is separated. Currently her  31 year old son lives with her but he is moving out. She has a 41 year old daughter who is on her own. 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 Her rise time is 6:30 AM. She does not wake up rested. Her Epworth sleepiness score is 11 out of 24 today, her fatigue score is 37 out of 63. She has restless leg symptoms. These are intermittent. She has occasional leg cramps as well. She has nocturia, usually once or twice per night. She does not drink caffeine daily. She quit smoking in 1992. She drinks alcohol very rarely.  Her Past Medical History Is Significant For: Past Medical History  Diagnosis Date  . Hyperlipidemia   . Arthritis   . Venous insufficiency of leg   . Multiple allergies     takes Zyrtec daily;uses Nasonex daily as needed  . Constipation     takes Colace every other day  . GERD (gastroesophageal reflux disease) 12/06/2011    takes Nexium daily  . Peripheral edema     takes Furosemide daily  . Hypothyroidism 12/06/2011    takes Synthroid daily  . Dry eyes     uses Systane Eye drops daily as needed  . Hypertension     takes Metoprolol and Diovan daily  . History of bronchitis 6-7 yrs ago  . Headache(784.0)     occasionally-d/t congestion  . Congestion of nasal sinus   . Numbness     weakness-right hand  . Joint pain   . Urinary frequency   . Nocturia   . Anemia when she was young  . Diabetes mellitus without complication ([place])  borderline  . Pancreatitis   . Shingles   . DDD (degenerative disc disease), lumbar     Her Past Surgical History Is Significant For: Past Surgical History  Procedure Laterality Date  . Cholecystectomy    . Abdominal hysterectomy      uterine prolapse  . Tubal ligation    . Foot surgery Bilateral   . Back surgery      fusion  . Cardiac catheterization  2007    NORMAL; catheter-induced spasm  . Esophagogastroduodenoscopy  5-51yrs ago  . Colonoscopy    . Shoulder arthroscopy with rotator cuff repair and subacromial  decompression Right 08/23/2013    Procedure: RIGHT SHOULDER ARTHROSCOPY WITH  SUBACROMIAL DECOMPRESSION DISTAL CLAVICLE RESECTION AND  ROTATOR CUFF REPAIR ;  Surgeon: Marin Shutter, MD;  Location: [facility];  Service: Orthopedics;  Laterality: Right;    Her Family History Is Significant For: Family History  Problem Relation Age of Onset  . Hypertension Mother   . Hypertension Father   . Hypertension Sister   . Kidney disease Sister     [facility]  . Thyroid disease Sister   . Breast cancer Maternal Aunt     >50; passed away from it  . Breast cancer Maternal Aunt     >50  . Thyroid disease Sister   . Hypertension Sister     Her Social History Is Significant For: Social History   Social History  . Marital Status: Legally Separated    Spouse Name: N/A  . Number of Children: 2  . Years of Education: College   Occupational History  .  [employer]   Social History Main Topics  . Smoking status: Former Research scientist (life sciences)  . Smokeless tobacco: Never Used     Comment: quit smoking in 1992  . Alcohol Use: 0.0 oz/week    0 Standard drinks or equivalent per week     Comment: rarely  . Drug Use: No  . Sexual Activity: Yes    Birth Control/ Protection: Surgical   Other Topics Concern  . None   Social History Narrative   Caffeine: Coffee    Her Allergies Are:  Allergies  Allergen Reactions  . Metronidazole     Developed pancreatitis after taking this  . Adhesive [Tape]   . Penicillins Rash    Has patient had a PCN reaction causing immediate rash, facial/tongue/throat swelling, SOB or lightheadedness with hypotension: No Has patient had a PCN reaction causing severe rash involving mucus membranes or skin necrosis: No Has patient had a PCN reaction that required hospitalization No Has patient had a PCN reaction occurring within the last 10 years: No If all of the above answers are "NO", then may proceed with Cephalosporin use.  Marland Kitchen Vesicare [Solifenacin] Rash  :   Her  Current Medications Are:  Outpatient Encounter Prescriptions as of 12/04/2014  Medication Sig  . albuterol (PROVENTIL HFA;VENTOLIN HFA) 108 (90 BASE) MCG/ACT inhaler Inhale 1-2 puffs into the lungs every 6 (six) hours as needed for wheezing or shortness of breath.  Marland Kitchen aspirin EC 81 MG tablet Take 81 mg by mouth every other day.   . cetirizine (ZYRTEC) 10 MG tablet Take 10 mg by mouth daily.  . diazepam (VALIUM) 5 MG tablet Take 0.5-1 tablets (2.5-5 mg total) by mouth every 6 (six) hours as needed for muscle spasms or sedation.  . docusate sodium (COLACE) 100 MG capsule Take 100 mg by mouth daily.   Marland Kitchen esomeprazole (NEXIUM) 40 MG  capsule Take 40 mg by mouth daily at 12 noon.  . Fish Oil OIL Take 1 capsule by mouth daily.  . furosemide (LASIX) 20 MG tablet Take 1 tablet (20 mg total) by mouth daily.  Marland Kitchen gabapentin (NEURONTIN) 300 MG capsule Take 300 mg by mouth 2 (two) times daily.   Marland Kitchen HYDROcodone-acetaminophen (NORCO) 10-325 MG per tablet Take 1 tablet by mouth every 6 (six) hours as needed for severe pain.  Marland Kitchen levothyroxine (SYNTHROID, LEVOTHROID) 75 MCG tablet Take 75 mcg by mouth daily before breakfast.  . lubiprostone (AMITIZA) 8 MCG capsule Take 8 mcg by mouth daily with breakfast.  . meloxicam (MOBIC) 15 MG tablet Take 15 mg by mouth daily.  . metoprolol tartrate (LOPRESSOR) 25 MG tablet Take 25 mg by mouth 2 (two) times daily.  . mometasone (NASONEX) 50 MCG/ACT nasal spray Place 2 sprays into the nose daily as needed (allergies).  . neomycin-polymyxin-hydrocortisone (CORTISPORIN) otic solution Apply one to two drops to toe after soaking twice daily.  . ondansetron (ZOFRAN) 4 MG tablet Take 1 tablet (4 mg total) by mouth every 8 (eight) hours as needed for nausea or vomiting.  Marland Kitchen oxyCODONE-acetaminophen (PERCOCET) 5-325 MG per tablet Take 1-2 tablets by mouth every 4 (four) hours as needed.  . Pitavastatin Calcium (LIVALO) 2 MG TABS Take 0.5 tablets (1 mg total) by mouth daily.  Vladimir Faster  Glycol-Propyl Glycol (SYSTANE OP) Place 1 drop into both eyes daily as needed (dry eyes).   . promethazine (PHENERGAN) 25 MG tablet Take 1 tablet (25 mg total) by mouth every 8 (eight) hours as needed for nausea or vomiting.  . valsartan-hydrochlorothiazide (DIOVAN-HCT) 160-25 MG per tablet Take 1 tablet by mouth daily.   No facility-administered encounter medications on file as of 12/04/2014.  :  Review of Systems:  Out of a complete 14 point review of systems, all are reviewed and negative with the exception of these symptoms as listed below:   Review of Systems  Neurological:       Restless sleeper, wakes up feeling tired, snoring, woke up choking (last episode about a month ago and went to ED), daytime tiredness, falls asleep when sitting still   Epworth Sleepiness Scale 0= would never doze 1= slight chance of dozing 2= moderate chance of dozing 3= high chance of dozing  Sitting and reading:1 Watching TV:1 Sitting inactive in a public place (ex. Theater or meeting):1 As a passenger in a car for an hour without a break:1 Lying down to rest in the afternoon:3 Sitting and talking to someone:1 Sitting quietly after lunch (no alcohol):2 In a car, while stopped in traffic:1 Total:11  Objective:  Neurologic Exam  Physical Exam Physical Examination:   Filed Vitals:   12/04/14 1527  BP: 114/70  Pulse: 72  Resp: 16    General Examination: The patient is a very pleasant 54 y.o. female in no acute distress. She appears well-developed and well-nourished and well groomed. She is obese.    HEENT: Normocephalic, atraumatic, pupils are equal, round and reactive to light and accommodation. Funduscopic exam is normal with sharp disc margins noted. Extraocular tracking is good without limitation to gaze excursion or nystagmus noted. Normal smooth pursuit is noted. Hearing is grossly intact. Tympanic membranes are clear bilaterally. Face is symmetric with normal facial animation and normal  facial sensation. Speech is clear with no dysarthria noted. There is no hypophonia. There is no lip, neck/Fischer, jaw or voice tremor. Neck is supple with full range of passive and active  motion. There are no carotid bruits on auscultation. Oropharynx exam reveals: moderate mouth dryness, and she has hyperpigmentation in her mouth. She has adequate dental hygiene. She has a minimal overbite. She has moderate airway crowding secondary to larger tongue, smaller airway entry and redundant soft palate. Tonsils are small, 1+ bilaterally. Neck circumference is 15-5/8 inches.    Chest: Clear to auscultation without wheezing, rhonchi or crackles noted.  Heart: S1+S2+0, regular and normal without murmurs, rubs or gallops noted.   Abdomen: Soft, non-tender and non-distended with normal bowel sounds appreciated on auscultation.  Extremities: There is trace pitting edema in the distal lower extremities bilaterally. Pedal pulses are intact.  Skin: Warm and dry without trophic changes noted. There are no varicose veins.  Musculoskeletal: exam reveals no obvious joint deformities, tenderness or joint swelling or erythema.   Neurologically:  Mental status: The patient is awake, alert and oriented in all 4 spheres. Her immediate and remote memory, attention, language skills and fund of knowledge are appropriate. There is no evidence of aphasia, agnosia, apraxia or anomia. Speech is clear with normal prosody and enunciation. Thought process is linear. Mood is normal and affect is normal.  Cranial nerves II - XII are as described above under HEENT exam. In addition: shoulder shrug is normal with equal shoulder height noted. Motor exam: Normal bulk, strength and tone is noted. There is no drift, tremor or rebound. Romberg is negative. Reflexes are 2+ throughout. Babinski: Toes are extensor bilaterally. Fine motor skills and coordination: intact with normal finger taps, normal hand movements, normal rapid alternating  patting, normal foot taps and normal foot agility.  Cerebellar testing: No dysmetria or intention tremor on finger to nose testing. Heel to shin is unremarkable bilaterally. There is no truncal or gait ataxia.  Sensory exam: intact to light touch, pinprick, vibration, temperature sense in the upper and lower extremities.  Gait, station and balance: She stands easily. No veering to one side is noted. No leaning to one side is noted. Posture is age-appropriate and stance is narrow based. Gait shows normal stride length and normal pace. No problems turning are noted. She turns en bloc. Tandem walk is slightly challenging in the beginning.   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 I had a long chat with the patient about my findings and the diagnosis of OSA, its prognosis and treatment options. We talked about medical treatments, surgical interventions and non-pharmacological approaches. I explained in particular the risks and ramifications of untreated moderate to severe OSA, especially with respect to developing cardiovascular disease down the Road, including congestive heart failure, difficult to treat hypertension, cardiac arrhythmias, or stroke. Even type 2 diabetes has, in part, been linked to untreated OSA. Symptoms of untreated OSA include daytime sleepiness, memory problems, mood irritability and mood disorder such as depression and anxiety, lack of energy, as well as recurrent headaches, especially morning headaches. We talked about trying to maintain a healthy lifestyle in general, as well as the  importance of weight control. I encouraged the patient to eat healthy, exercise daily and keep well hydrated, to keep a scheduled bedtime and wake time routine, to not skip any meals and eat healthy  snacks in between meals. I advised the patient not to drive when feeling sleepy. I recommended the following at this time: sleep study with potential positive airway pressure titration. (We will score hypopneas at 3% and split the sleep study into diagnostic and treatment portion, if the estimated. 2 hour AHI is >15/h).   I explained the sleep test procedure to the patient and also outlined possible surgical and non-surgical treatment options of OSA, including the use of a custom-made dental device (which would require a referral to a specialist dentist or oral surgeon), upper airway surgical options, such as pillar implants, radiofrequency surgery, tongue base surgery, and UPPP (which would involve a referral to an ENT surgeon). Rarely, jaw surgery such as mandibular advancement may be considered.  I also explained the CPAP treatment option to the patient, who indicated that she would be willing to try CPAP if the need arises. I explained the importance of being compliant with PAP treatment, not only for insurance purposes but primarily to improve Her symptoms, and for the patient's long term health benefit, including to reduce Her cardiovascular risks. I answered all her questions today and the patient was in agreement. I would like to see her back after the sleep study is completed and encouraged her to call with any interim questions, concerns, problems or updates.   Thank you very much for allowing me to participate in the care of this nice patient. If I can be of any further assistance to you please do not hesitate to call me at [phone].  Sincerely,   [name], MD, PhD

## 2014-12-04 NOTE — Patient Instructions (Signed)

## 2015-01-03 ENCOUNTER — Ambulatory Visit (INDEPENDENT_AMBULATORY_CARE_PROVIDER_SITE_OTHER): Payer: 59 | Admitting: Neurology

## 2015-01-03 DIAGNOSIS — G4733 Obstructive sleep apnea (adult) (pediatric): Secondary | ICD-10-CM | POA: Diagnosis not present

## 2015-01-03 DIAGNOSIS — G479 Sleep disorder, unspecified: Secondary | ICD-10-CM

## 2015-01-03 NOTE — Sleep Study (Signed)
Please see the scanned sleep study interpretation located in the procedure tab within the chart review section.   

## 2015-01-07 ENCOUNTER — Telehealth: Payer: Self-pay | Admitting: Neurology

## 2015-01-07 DIAGNOSIS — G4733 Obstructive sleep apnea (adult) (pediatric): Secondary | ICD-10-CM

## 2015-01-07 NOTE — Telephone Encounter (Signed)
Diana:  Patient referred by Dr. Ardeth Perfect, seen by me on 12/04/14, split study on 01/03/15, Ins: [employer]. Please call and notify patient that the recent sleep study confirmed the diagnosis of severe OSA. She did well with CPAP during the study with significant improvement of the respiratory events. Therefore, I would like start the patient on CPAP therapy at home by prescribing a machine for home use. I placed the order in the chart. The patient will need a follow up appointment with me in 8 to 10 weeks post set up that has to be scheduled; please go ahead and schedule while you have the patient on the phone and make sure patient understands the importance of keeping this window for the FU appointment, as it is often an insurance requirement and failing to adhere to this may result in losing coverage for sleep apnea treatment.  Please re-enforce the importance of compliance with treatment and the need for [place] to monitor compliance data - again an insurance requirement and good feedback for the patient as far as how they are doing.  Also remind patient, that any upcoming CPAP machine or mask issues, should be first addressed with the DME company. Please ask if patient has a preference regarding DME company.  Please arrange for CPAP set up at home through a DME company of patient's choice - once you have spoken to the patient - and faxed/routed report to PCP and referring MD (if other than PCP), you can close this encounter, thanks,   [name], MD, PhD [facility] ([place])

## 2015-01-07 NOTE — Telephone Encounter (Signed)
I spoke to patient and she is aware of results and recommendations. She would like to proceed with treatment and I will send order to [facility]. I will also send her a letter to remind her to make f/u appt and stress the importance of compliance.

## 2015-01-21 ENCOUNTER — Other Ambulatory Visit: Payer: Self-pay | Admitting: Cardiovascular Disease

## 2015-02-03 DIAGNOSIS — M47816 Spondylosis without myelopathy or radiculopathy, lumbar region: Secondary | ICD-10-CM | POA: Diagnosis not present

## 2015-02-10 DIAGNOSIS — M47816 Spondylosis without myelopathy or radiculopathy, lumbar region: Secondary | ICD-10-CM | POA: Diagnosis not present

## 2015-02-14 DIAGNOSIS — R339 Retention of urine, unspecified: Secondary | ICD-10-CM | POA: Diagnosis not present

## 2015-02-14 DIAGNOSIS — M4806 Spinal stenosis, lumbar region: Secondary | ICD-10-CM | POA: Diagnosis not present

## 2015-02-14 DIAGNOSIS — G4733 Obstructive sleep apnea (adult) (pediatric): Secondary | ICD-10-CM | POA: Diagnosis not present

## 2015-02-18 ENCOUNTER — Telehealth: Payer: Self-pay

## 2015-02-18 NOTE — Telephone Encounter (Signed)
I spoke to patient to reschedule appt, Dr. Rexene Alberts is out sick. She asked to call back and reschedule. Patient is welcome to reschedule with Dr. Rexene Alberts or any NP.

## 2015-02-19 ENCOUNTER — Encounter: Payer: Self-pay | Admitting: Nurse Practitioner

## 2015-02-19 ENCOUNTER — Ambulatory Visit (INDEPENDENT_AMBULATORY_CARE_PROVIDER_SITE_OTHER): Payer: 59 | Admitting: Nurse Practitioner

## 2015-02-19 ENCOUNTER — Ambulatory Visit: Payer: 59 | Admitting: Neurology

## 2015-02-19 VITALS — BP 107/67 | HR 55 | Resp 20 | Ht 63.0 in | Wt 279.8 lb

## 2015-02-19 DIAGNOSIS — Z6841 Body Mass Index (BMI) 40.0 and over, adult: Secondary | ICD-10-CM

## 2015-02-19 DIAGNOSIS — I10 Essential (primary) hypertension: Secondary | ICD-10-CM | POA: Diagnosis not present

## 2015-02-19 DIAGNOSIS — Z9989 Dependence on other enabling machines and devices: Principal | ICD-10-CM

## 2015-02-19 DIAGNOSIS — G4733 Obstructive sleep apnea (adult) (pediatric): Secondary | ICD-10-CM | POA: Diagnosis not present

## 2015-02-19 NOTE — Patient Instructions (Signed)
CPAP compliance is excellent continue pressure 9 cm Follow-up in 6 months

## 2015-02-19 NOTE — Progress Notes (Addendum)
[facility]  PATIENT: [name] DOB: 1960-06-30   REASON FOR VISIT: Follow-up for obstructive sleep apnea with CPAP, restless leg syndrome, obesity HISTORY FROM: Patient    HISTORY OF PRESENT ILLNESS: [name], 55 year old female returns for follow-up. This is her first follow-up after initiating CPAP. She had a sleep study on 01/03/2015, which was positive for obstructive sleep apnea and repetitive intrusions of sleep. She was placed on CPAP at 9 cm of water pressure using nasal pillows. Her CPAP compliance report shows 100% compliant, average usage 6 hours 25 minutes, AHI 2.4. She claims she is still getting used to the machine that she feels rested on awakening. ESS score today 7. She has no new neurologic complaints. She returns for reevaluation  HISTORY SA 12/04/14 55 year old right-handed woman with an underlying medical history of hypertension, hyperlipidemia, diabetes, arthritis, hypothyroidism, reflux disease, history of pancreatitis, deemed secondary to medication, degenerative spine disease with low back pain, lower extremity edema and severe obesity, who reports snoring and excessive daytime somnolence. I reviewed your office note from 11/12/2014, which you kindly included.  She went to the ER on 11/09/14, after she woke up in the middle of the night a couple with SOB and a sense of choking. She has a family history of obstructive sleep apnea in her older sister who uses a CPAP machine. During the emergency room visit she was noted to have more lower extremity swelling. She was given IV Lasix 1 and her maintenance Lasix was increased to 30 mg from 20 mg daily. She had a chest x-ray and then a CT angiogram of her chest which showed: 1. No acute cardiopulmonary disease. Specifically, no evidence of pulmonary embolism to the level of the bilateral subsegmental pulmonary arteries.Borderline cardiomegaly.Coronary artery calcifications. Indeterminate punctate  (approximately 2 mm) right middle lobe pulmonary nodule. If the patient is at high risk for bronchogeniccarcinoma, follow-up chest CT at 1 year is recommended. If thepatient is at low risk, no follow-up is needed.  She reports a bedtime of around 10 PM. She falls asleep quickly. She is separated. Currently her 29 year old son lives with her but he is moving out. She has a 43 year old daughter who is on her own. 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 Her rise time is 6:30 AM. She does not wake up rested. Her Epworth sleepiness score is 11 out of 24 today, her fatigue score is 37 out of 63. She has restless leg symptoms. These are intermittent. She has occasional leg cramps as well. She has nocturia, usually once or twice per night. She does not drink caffeine daily. She quit smoking in 1992. She drinks alcohol very rarely.  REVIEW OF SYSTEMS: Full 14 system review of systems performed and notable only for those listed, all others are neg:  Constitutional: neg  Cardiovascular: neg Ear/Nose/Throat: neg  Skin: neg Eyes: neg Respiratory: neg Gastroitestinal: neg  Hematology/Lymphatic: neg  Endocrine: neg Musculoskeletal: Back pain joint pain, history of back surgery Allergy/Immunology: neg Neurological: neg Psychiatric: neg Sleep : neg   ALLERGIES: Allergies  Allergen Reactions  . Metronidazole     Developed pancreatitis after taking this  . Adhesive [Tape]   . Penicillins Rash    Has patient had a PCN reaction causing immediate rash, facial/tongue/throat swelling, SOB or lightheadedness with hypotension: No Has patient had a PCN reaction causing severe rash involving mucus membranes or skin necrosis: No Has patient had a PCN reaction that required hospitalization No Has patient had a  PCN reaction occurring within the last 10 years: No If all of the above answers are "NO", then may proceed with Cephalosporin use.  Marland Kitchen Vesicare [Solifenacin] Rash    HOME  MEDICATIONS: Outpatient Prescriptions Prior to Visit  Medication Sig Dispense Refill  . albuterol (PROVENTIL HFA;VENTOLIN HFA) 108 (90 BASE) MCG/ACT inhaler Inhale 1-2 puffs into the lungs every 6 (six) hours as needed for wheezing or shortness of breath. 1 Inhaler 2  . aspirin EC 81 MG tablet Take 81 mg by mouth every other day.     . cetirizine (ZYRTEC) 10 MG tablet Take 10 mg by mouth daily.    . diazepam (VALIUM) 5 MG tablet Take 0.5-1 tablets (2.5-5 mg total) by mouth every 6 (six) hours as needed for muscle spasms or sedation. 40 tablet 1  . docusate sodium (COLACE) 100 MG capsule Take 100 mg by mouth daily.     Marland Kitchen esomeprazole (NEXIUM) 40 MG capsule Take 40 mg by mouth daily at 12 noon.    . Fish Oil OIL Take 1 capsule by mouth daily.    . furosemide (LASIX) 20 MG tablet TAKE 1 TABLET BY MOUTH ONCE DAILY 90 tablet 2  . gabapentin (NEURONTIN) 300 MG capsule Take 300 mg by mouth 2 (two) times daily.     Marland Kitchen HYDROcodone-acetaminophen (NORCO) 10-325 MG per tablet Take 1 tablet by mouth every 6 (six) hours as needed for severe pain.    Marland Kitchen levothyroxine (SYNTHROID, LEVOTHROID) 75 MCG tablet Take 75 mcg by mouth daily before breakfast.    . lubiprostone (AMITIZA) 8 MCG capsule Take 8 mcg by mouth daily with breakfast.    . meloxicam (MOBIC) 15 MG tablet Take 15 mg by mouth daily.    . metoprolol tartrate (LOPRESSOR) 25 MG tablet Take 25 mg by mouth 2 (two) times daily.    . mometasone (NASONEX) 50 MCG/ACT nasal spray Place 2 sprays into the nose daily as needed (allergies).    . neomycin-polymyxin-hydrocortisone (CORTISPORIN) otic solution Apply one to two drops to toe after soaking twice daily. 10 mL 1  . ondansetron (ZOFRAN) 4 MG tablet Take 1 tablet (4 mg total) by mouth every 8 (eight) hours as needed for nausea or vomiting. 20 tablet 0  . oxyCODONE-acetaminophen (PERCOCET) 5-325 MG per tablet Take 1-2 tablets by mouth every 4 (four) hours as needed. 50 tablet 0  . Pitavastatin Calcium (LIVALO)  2 MG TABS Take 0.5 tablets (1 mg total) by mouth daily. 14 tablet 0  . Polyethyl Glycol-Propyl Glycol (SYSTANE OP) Place 1 drop into both eyes daily as needed (dry eyes).     . promethazine (PHENERGAN) 25 MG tablet Take 1 tablet (25 mg total) by mouth every 8 (eight) hours as needed for nausea or vomiting. 15 tablet 0  . valsartan-hydrochlorothiazide (DIOVAN-HCT) 160-25 MG per tablet Take 1 tablet by mouth daily.     No facility-administered medications prior to visit.    PAST MEDICAL HISTORY: Past Medical History  Diagnosis Date  . Hyperlipidemia   . Arthritis   . Venous insufficiency of leg   . Multiple allergies     takes Zyrtec daily;uses Nasonex daily as needed  . Constipation     takes Colace every other day  . GERD (gastroesophageal reflux disease) 12/06/2011    takes Nexium daily  . Peripheral edema     takes Furosemide daily  . Hypothyroidism 12/06/2011    takes Synthroid daily  . Dry eyes     uses Systane Eye drops daily  as needed  . Hypertension     takes Metoprolol and Diovan daily  . History of bronchitis 6-7 yrs ago  . Headache(784.0)     occasionally-d/t congestion  . Congestion of nasal sinus   . Numbness     weakness-right hand  . Joint pain   . Urinary frequency   . Nocturia   . Anemia when she was young  . Diabetes mellitus without complication (HCC)     borderline  . Pancreatitis   . Shingles   . DDD (degenerative disc disease), lumbar   . OSA on CPAP     PAST SURGICAL HISTORY: Past Surgical History  Procedure Laterality Date  . Cholecystectomy    . Abdominal hysterectomy      uterine prolapse  . Tubal ligation    . Foot surgery Bilateral   . Back surgery      fusion  . Cardiac catheterization  2007    NORMAL; catheter-induced spasm  . Esophagogastroduodenoscopy  5-59yrs ago  . Colonoscopy    . Shoulder arthroscopy with rotator cuff repair and subacromial decompression Right 08/23/2013    Procedure: RIGHT SHOULDER ARTHROSCOPY WITH   SUBACROMIAL DECOMPRESSION DISTAL CLAVICLE RESECTION AND  ROTATOR CUFF REPAIR ;  Surgeon: Marin Shutter, MD;  Location: [facility];  Service: Orthopedics;  Laterality: Right;    FAMILY HISTORY: Family History  Problem Relation Age of Onset  . Hypertension Mother   . Hypertension Father   . Hypertension Sister   . Kidney disease Sister     [facility] TRANSPLANT  . Thyroid disease Sister   . Breast cancer Maternal Aunt     >50; passed away from it  . Breast cancer Maternal Aunt     >50  . Thyroid disease Sister   . Hypertension Sister     SOCIAL HISTORY: Social History   Social History  . Marital Status: Legally Separated    Spouse Name: N/A  . Number of Children: 2  . Years of Education: College   Occupational History  .  [employer]   Social History Main Topics  . Smoking status: Former Research scientist (life sciences)  . Smokeless tobacco: Never Used     Comment: quit smoking in 1992  . Alcohol Use: 0.0 oz/week    0 Standard drinks or equivalent per week     Comment: rarely  . Drug Use: No  . Sexual Activity: Yes    Birth Control/ Protection: Surgical   Other Topics Concern  . Not on file   Social History Narrative   Caffeine: Coffee     PHYSICAL EXAM  Filed Vitals:   02/19/15 0816  BP: 107/67  Pulse: 55  Resp: 20  Height: 5\' 3"  (1.6 m)  Weight: 279 lb 12.8 oz (126.916 kg)   Body mass index is 49.58 kg/(m^2).  Generalized: Well developed, morbidly obese female in no acute distress , well groomed Head: normocephalic and atraumatic,. Oropharynx benign  Neck: Supple, no carotid bruits , neck circumference 15.5/8 inches Cardiac: Regular rate rhythm, no murmur  Pulmonary clear to auscultation Musculoskeletal: No deformity   Neurological examination   Mentation: Alert oriented to time, place, history taking. Attention span and concentration appropriate. Recent and remote memory intact.  Follows all commands speech and language fluent. ESS 7  Cranial nerve II-XII:  Fundoscopic exam reveals sharp disc margins.Pupils were equal round reactive to light extraocular movements were full, visual field were full on confrontational test. Facial sensation and strength were normal. hearing was  intact to finger rubbing bilaterally. Uvula tongue midline. head turning and shoulder shrug were normal and symmetric.Tongue protrusion into cheek strength was normal. Motor: normal bulk and tone, full strength in the BUE, BLE, fine finger movements normal, no pronator drift. No focal weakness Sensory: normal and symmetric to light touch, pinprick, and  Vibration, proprioception  Coordination: finger-nose-finger, heel-to-shin bilaterally, no dysmetria Reflexes: Brachioradialis 2/2, biceps 2/2, triceps 2/2, patellar 2/2, Achilles 2/2, plantar responses were flexor bilaterally. Gait and Station: Rising up from seated position without assistance, normal stance,  moderate stride, good arm swing, smooth turning, able to perform tiptoe, and heel walking without difficulty. Tandem gait is mildly unsteady   DIAGNOSTIC DATA (LABS, IMAGING, TESTING) - I reviewed patient records, labs, notes, testing and imaging myself where available.  Lab Results  Component Value Date   WBC 6.2 11/09/2014   HGB 12.2 11/09/2014   HCT 37.9 11/09/2014   MCV 84.4 11/09/2014   PLT 335 11/09/2014      Component Value Date/Time   NA 140 11/09/2014 0846   K 4.2 11/09/2014 0846   CL 106 11/09/2014 0846   CO2 28 11/09/2014 0846   GLUCOSE 108* 11/09/2014 0846   BUN 12 11/09/2014 0846   CREATININE 0.65 11/09/2014 0846   CREATININE 0.91 06/19/2013 0813   CALCIUM 8.8* 11/09/2014 0846   PROT 7.8 11/09/2014 0846   ALBUMIN 3.6 11/09/2014 0846   AST 38 11/09/2014 0846   ALT 26 11/09/2014 0846   ALKPHOS 92 11/09/2014 0846   BILITOT 0.7 11/09/2014 0846   GFRNONAA >60 11/09/2014 0846   GFRAA >60 11/09/2014 0846    Lab Results  Component Value Date   TSH 1.891 10/14/2014      ASSESSMENT AND  PLAN  55 y.o. year old female  has a past medical history of Hyperlipidemia; Arthritis; Venous insufficiency of leg; Hypertension; Joint pain; Urinary frequency; Nocturia;  Diabetes mellitus without complication ([place]); Pancreatitis deemed secondary to medications DDD (degenerative disc disease), lumbar; and OSA on CPAP. here to follow-up for her compliance with her CPAP. CPAP compliance report shows 100% compliant, average usage 6 hours 25 minutes, AHI 2.4.  The patient is a current patient of Dr. Rexene Alberts who is out of the office today . This note is sent to the work in doctor.     CPAP compliance is excellent continue pressure 9 cm Encouraged weight loss which will aid in reducing the patient's sleep disorder breathing The patient is cautioned not to drive or operate dangerous equipment plantar sleepy, she should be advised to avoid sedating medications Follow-up in 6 monthsVst time 25 min Dennie Bible, [facility], [facility], [facility] [address] [place], [place] [address] [phone]  I have reviewed and agree with the above plan. Richard A. Felecia Shelling, MD, PhD Certified in Neurology, Clinical Neurophysiology, Sleep Medicine, Pain Medicine and Neuroimaging

## 2015-03-11 ENCOUNTER — Telehealth: Payer: Self-pay | Admitting: Neurology

## 2015-03-11 DIAGNOSIS — G4733 Obstructive sleep apnea (adult) (pediatric): Secondary | ICD-10-CM

## 2015-03-11 DIAGNOSIS — Z9989 Dependence on other enabling machines and devices: Principal | ICD-10-CM

## 2015-03-11 NOTE — Telephone Encounter (Signed)
Needs FU in July w me for OSA, pls make appt. Mask requested, pls fax to [facility]

## 2015-03-11 NOTE — Telephone Encounter (Signed)
Pt called and is requesting an order by sent to her DME for a Dream Wear mask. She says it is a pillow mask for CPAP. She uses [facility] on [facility]. May call pt at [phone]

## 2015-03-11 NOTE — Telephone Encounter (Signed)
OK for order? 

## 2015-03-11 NOTE — Telephone Encounter (Signed)
I spoke to [name] and advised her that orders have been sent in. I changed her appt in July to be on Dr. Guadelupe Sabin schedule.

## 2015-03-13 DIAGNOSIS — R339 Retention of urine, unspecified: Secondary | ICD-10-CM | POA: Diagnosis not present

## 2015-03-13 DIAGNOSIS — M4806 Spinal stenosis, lumbar region: Secondary | ICD-10-CM | POA: Diagnosis not present

## 2015-03-13 DIAGNOSIS — G4733 Obstructive sleep apnea (adult) (pediatric): Secondary | ICD-10-CM | POA: Diagnosis not present

## 2015-03-17 DIAGNOSIS — R339 Retention of urine, unspecified: Secondary | ICD-10-CM | POA: Diagnosis not present

## 2015-03-17 DIAGNOSIS — M4806 Spinal stenosis, lumbar region: Secondary | ICD-10-CM | POA: Diagnosis not present

## 2015-03-17 DIAGNOSIS — G4733 Obstructive sleep apnea (adult) (pediatric): Secondary | ICD-10-CM | POA: Diagnosis not present

## 2015-03-18 DIAGNOSIS — M47816 Spondylosis without myelopathy or radiculopathy, lumbar region: Secondary | ICD-10-CM | POA: Diagnosis not present

## 2015-03-18 DIAGNOSIS — M961 Postlaminectomy syndrome, not elsewhere classified: Secondary | ICD-10-CM | POA: Diagnosis not present

## 2015-03-26 DIAGNOSIS — E119 Type 2 diabetes mellitus without complications: Secondary | ICD-10-CM | POA: Diagnosis not present

## 2015-03-26 DIAGNOSIS — Z6841 Body Mass Index (BMI) 40.0 and over, adult: Secondary | ICD-10-CM | POA: Diagnosis not present

## 2015-03-26 DIAGNOSIS — I10 Essential (primary) hypertension: Secondary | ICD-10-CM | POA: Diagnosis not present

## 2015-03-26 DIAGNOSIS — R600 Localized edema: Secondary | ICD-10-CM | POA: Diagnosis not present

## 2015-03-26 DIAGNOSIS — E784 Other hyperlipidemia: Secondary | ICD-10-CM | POA: Diagnosis not present

## 2015-03-28 ENCOUNTER — Other Ambulatory Visit: Payer: Self-pay | Admitting: Cardiovascular Disease

## 2015-03-28 NOTE — Telephone Encounter (Signed)
Rx request sent to pharmacy.  

## 2015-04-14 DIAGNOSIS — G4733 Obstructive sleep apnea (adult) (pediatric): Secondary | ICD-10-CM | POA: Diagnosis not present

## 2015-04-14 DIAGNOSIS — R339 Retention of urine, unspecified: Secondary | ICD-10-CM | POA: Diagnosis not present

## 2015-04-14 DIAGNOSIS — M4806 Spinal stenosis, lumbar region: Secondary | ICD-10-CM | POA: Diagnosis not present

## 2015-05-15 DIAGNOSIS — R339 Retention of urine, unspecified: Secondary | ICD-10-CM | POA: Diagnosis not present

## 2015-05-15 DIAGNOSIS — G4733 Obstructive sleep apnea (adult) (pediatric): Secondary | ICD-10-CM | POA: Diagnosis not present

## 2015-05-15 DIAGNOSIS — M4806 Spinal stenosis, lumbar region: Secondary | ICD-10-CM | POA: Diagnosis not present

## 2015-05-26 DIAGNOSIS — E038 Other specified hypothyroidism: Secondary | ICD-10-CM | POA: Diagnosis not present

## 2015-05-26 DIAGNOSIS — E119 Type 2 diabetes mellitus without complications: Secondary | ICD-10-CM | POA: Diagnosis not present

## 2015-05-26 DIAGNOSIS — Z0000 Encounter for general adult medical examination without abnormal findings: Secondary | ICD-10-CM | POA: Diagnosis not present

## 2015-06-02 DIAGNOSIS — R1032 Left lower quadrant pain: Secondary | ICD-10-CM | POA: Diagnosis not present

## 2015-06-02 DIAGNOSIS — Z6841 Body Mass Index (BMI) 40.0 and over, adult: Secondary | ICD-10-CM | POA: Diagnosis not present

## 2015-06-02 DIAGNOSIS — M545 Low back pain: Secondary | ICD-10-CM | POA: Diagnosis not present

## 2015-06-02 DIAGNOSIS — N390 Urinary tract infection, site not specified: Secondary | ICD-10-CM | POA: Diagnosis not present

## 2015-06-02 DIAGNOSIS — E119 Type 2 diabetes mellitus without complications: Secondary | ICD-10-CM | POA: Diagnosis not present

## 2015-06-05 DIAGNOSIS — M4806 Spinal stenosis, lumbar region: Secondary | ICD-10-CM | POA: Diagnosis not present

## 2015-06-05 DIAGNOSIS — R339 Retention of urine, unspecified: Secondary | ICD-10-CM | POA: Diagnosis not present

## 2015-06-05 DIAGNOSIS — G4733 Obstructive sleep apnea (adult) (pediatric): Secondary | ICD-10-CM | POA: Diagnosis not present

## 2015-06-09 DIAGNOSIS — R918 Other nonspecific abnormal finding of lung field: Secondary | ICD-10-CM | POA: Diagnosis not present

## 2015-06-09 DIAGNOSIS — E119 Type 2 diabetes mellitus without complications: Secondary | ICD-10-CM | POA: Diagnosis not present

## 2015-06-09 DIAGNOSIS — M5136 Other intervertebral disc degeneration, lumbar region: Secondary | ICD-10-CM | POA: Diagnosis not present

## 2015-06-09 DIAGNOSIS — I10 Essential (primary) hypertension: Secondary | ICD-10-CM | POA: Diagnosis not present

## 2015-06-09 DIAGNOSIS — Z0000 Encounter for general adult medical examination without abnormal findings: Secondary | ICD-10-CM | POA: Diagnosis not present

## 2015-06-09 DIAGNOSIS — E784 Other hyperlipidemia: Secondary | ICD-10-CM | POA: Diagnosis not present

## 2015-06-09 DIAGNOSIS — G4733 Obstructive sleep apnea (adult) (pediatric): Secondary | ICD-10-CM | POA: Diagnosis not present

## 2015-06-09 DIAGNOSIS — Z6841 Body Mass Index (BMI) 40.0 and over, adult: Secondary | ICD-10-CM | POA: Diagnosis not present

## 2015-06-09 DIAGNOSIS — R42 Dizziness and giddiness: Secondary | ICD-10-CM | POA: Diagnosis not present

## 2015-06-11 ENCOUNTER — Other Ambulatory Visit (HOSPITAL_COMMUNITY): Payer: Self-pay | Admitting: Internal Medicine

## 2015-06-11 DIAGNOSIS — R918 Other nonspecific abnormal finding of lung field: Secondary | ICD-10-CM

## 2015-06-14 DIAGNOSIS — R339 Retention of urine, unspecified: Secondary | ICD-10-CM | POA: Diagnosis not present

## 2015-06-14 DIAGNOSIS — G4733 Obstructive sleep apnea (adult) (pediatric): Secondary | ICD-10-CM | POA: Diagnosis not present

## 2015-06-14 DIAGNOSIS — M4806 Spinal stenosis, lumbar region: Secondary | ICD-10-CM | POA: Diagnosis not present

## 2015-06-19 ENCOUNTER — Ambulatory Visit (HOSPITAL_COMMUNITY): Payer: 59

## 2015-06-19 DIAGNOSIS — Z1212 Encounter for screening for malignant neoplasm of rectum: Secondary | ICD-10-CM | POA: Diagnosis not present

## 2015-06-24 ENCOUNTER — Ambulatory Visit (HOSPITAL_COMMUNITY): Payer: 59

## 2015-07-03 ENCOUNTER — Encounter: Payer: Self-pay | Admitting: Cardiovascular Disease

## 2015-07-09 ENCOUNTER — Ambulatory Visit (HOSPITAL_COMMUNITY)
Admission: RE | Admit: 2015-07-09 | Discharge: 2015-07-09 | Disposition: A | Discharge status: Home / Self Care | Payer: 59 | Source: Ambulatory Visit | Attending: Internal Medicine | Admitting: Internal Medicine

## 2015-07-09 DIAGNOSIS — R918 Other nonspecific abnormal finding of lung field: Secondary | ICD-10-CM | POA: Diagnosis not present

## 2015-07-09 DIAGNOSIS — I2510 Atherosclerotic heart disease of native coronary artery without angina pectoris: Secondary | ICD-10-CM | POA: Insufficient documentation

## 2015-07-24 DIAGNOSIS — M5415 Radiculopathy, thoracolumbar region: Secondary | ICD-10-CM | POA: Diagnosis not present

## 2015-07-24 DIAGNOSIS — G894 Chronic pain syndrome: Secondary | ICD-10-CM | POA: Diagnosis not present

## 2015-07-24 DIAGNOSIS — M961 Postlaminectomy syndrome, not elsewhere classified: Secondary | ICD-10-CM | POA: Diagnosis not present

## 2015-08-11 DIAGNOSIS — M5415 Radiculopathy, thoracolumbar region: Secondary | ICD-10-CM | POA: Diagnosis not present

## 2015-08-11 DIAGNOSIS — M961 Postlaminectomy syndrome, not elsewhere classified: Secondary | ICD-10-CM | POA: Diagnosis not present

## 2015-08-18 ENCOUNTER — Encounter: Payer: Self-pay | Admitting: Neurology

## 2015-08-18 ENCOUNTER — Ambulatory Visit (INDEPENDENT_AMBULATORY_CARE_PROVIDER_SITE_OTHER): Payer: 59 | Admitting: Neurology

## 2015-08-18 VITALS — BP 116/60 | HR 68 | Resp 16 | Ht 63.0 in | Wt 278.0 lb

## 2015-08-18 DIAGNOSIS — Z9989 Dependence on other enabling machines and devices: Principal | ICD-10-CM

## 2015-08-18 DIAGNOSIS — G4733 Obstructive sleep apnea (adult) (pediatric): Secondary | ICD-10-CM | POA: Diagnosis not present

## 2015-08-18 NOTE — Patient Instructions (Signed)
Please continue using your CPAP regularly. While your insurance requires that you use CPAP at least 4 hours each night on 70% of the nights, I recommend, that you not skip any nights and use it throughout the night if you can. Getting used to CPAP and staying with the treatment long term does take time and patience and discipline. Untreated obstructive sleep apnea when it is moderate to severe can have an adverse impact on cardiovascular health and raise her risk for heart disease, arrhythmias, hypertension, congestive heart failure, stroke and diabetes. Untreated obstructive sleep apnea causes sleep disruption, nonrestorative sleep, and sleep deprivation. This can have an impact on your day to day functioning and cause daytime sleepiness and impairment of cognitive function, memory loss, mood disturbance, and problems focussing. Using CPAP regularly can improve these symptoms.  Keep up the good work! I will see you back in one year sleep apnea check up.  Please work on your weight loss, drink more water, allow for more sleep time.

## 2015-08-18 NOTE — Progress Notes (Signed)
Subjective:    Patient ID: [name] is a 55 y.o. female.  HPI     Interim history:   [name] is a 55 year old right-handed woman with an underlying medical history of hypertension, hyperlipidemia, diabetes, arthritis, hypothyroidism, reflux disease, history of pancreatitis, deemed secondary to medication, degenerative spine disease with low back pain, lower extremity edema and severe obesity, who presents for follow-up consultation of her sleep apnea, on CPAP therapy. The patient is unaccompanied today. I first met her on 12/04/2014 at the request of her primary care physician, at which time she reported a family history of OSA, snoring and excessive daytime somnolence. I invited her back for sleep study. She had a split-night sleep study on 01/03/2015. I went over her test results with her in detail today. Baseline sleep efficiency was 68.7% with a latency to sleep of 18 minutes and wake after sleep onset of 45.5 minutes with severe sleep fragmentation noted. She had arousal index of 9 per hour. She had an increased percentage of stage 1 sleep, an increased percentage of slow-wave sleep and REM sleep of 3.6% with a prolonged REM latency. She had no significant PLMS, EKG or EEG changes. She had moderate to loud snoring. Total AHI was 30.1 per hour, average oxygen saturation 91%, nadir was 64%. She was then titrated on CPAP. Sleep efficiency was 72.4% during the second part of the study with sleep latency of 17.5 minutes and wake after sleep onset of 60 minutes with moderate sleep fragmentation noted. She had slow-wave sleep at 22.1%, REM sleep was 35.6%. Average oxygen saturation was 93%, nadir was 86%. CPAP was titrated from 5 cm to 9 cm. AHI was 1 per hour on the final pressure with supine REM sleep achieved. Based on her test results are prescribed CPAP therapy for home use at a pressure of 9 cm.   In the interim, she was seen by Cecille Rubin on 02/19/2015, at which time she was fully  compliant with CPAP therapy and doing well.   Today, 08/18/2015: I reviewed her CPAP compliance data from 07/15/2015 through 08/13/2015 which is a total of 30 days during which time she used her machine every night with percent used days greater than 4 hours at 100%, indicating superb compliance with an average usage of 5 hours and 37 minutes, residual AHI 0.5 per hour, leak low with the 95th percentile at 3.4 L/m on a pressure of 9 cm with EPR of 3.   Today, 08/18/2015: She reports overall doing well, sometimes the nasal pillows irritated her nostrils, otherwise she is doing well, indicating that she overall is sleeping better since CPAP was started. Does admit that she is not trying very hard to lose weight, does not always drink enough water. Does not always allow for enough sleep time, averaging about 6-1/2 hours or 6 hours. Sometimes she takes the CPAP off after using it for several hours and sleeps without it but does admit that she will wake up with snoring and some shortness of breath at night. She has had multiple epidural steroid injections in the past, most recent one helped quite a bit and she is quite pleased with that.  Previously:   12/04/2014: She reports snoring and excessive daytime somnolence. I reviewed your office note from 11/12/2014, which you kindly included.  She went to the ER on 11/09/14, after she woke up in the middle of the night a couple with SOB and a sense of choking. She has a family history of  obstructive sleep apnea in her older sister who uses a CPAP machine. During the emergency room visit she was noted to have more lower extremity swelling. She was given IV Lasix 1 and her maintenance Lasix was increased to 30 mg from 20 mg daily. She had a chest x-ray and then a CT angiogram of her chest which showed: 1. No acute cardiopulmonary disease. Specifically, no evidence of pulmonary embolism to the level of the bilateral subsegmental pulmonary arteries. 2. Borderline  cardiomegaly. 3. Coronary artery calcifications. 4. Indeterminate punctate (approximately 2 mm) right middle lobe pulmonary nodule. If the patient is at high risk for bronchogenic carcinoma, follow-up chest CT at 1 year is recommended. If the patient is at low risk, no follow-up is needed. She reports a bedtime of around 10 PM. She falls asleep quickly. She is separated. Currently her 9 year old son lives with her but he is moving out. She has a 22 year old daughter who is on her own. 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 Her rise time is 6:30 AM. She does not wake up rested. Her Epworth sleepiness score is 11 out of 24 today, her fatigue score is 37 out of 63. She has restless leg symptoms. These are intermittent. She has occasional leg cramps as well. She has nocturia, usually once or twice per night. She does not drink caffeine daily. She quit smoking in 1992. She drinks alcohol very rarely.  Her Past Medical History Is Significant For: Past Medical History:  Diagnosis Date  . Anemia when she was young  . Arthritis   . Congestion of nasal sinus   . Constipation    takes Colace every other day  . DDD (degenerative disc disease), lumbar   . Diabetes mellitus without complication (HCC)    borderline  . Dry eyes    uses Systane Eye drops daily as needed  . GERD (gastroesophageal reflux disease) 12/06/2011   takes Nexium daily  . Headache(784.0)    occasionally-d/t congestion  . History of bronchitis 6-7 yrs ago  . Hyperlipidemia   . Hypertension    takes Metoprolol and Diovan daily  . Hypothyroidism 12/06/2011   takes Synthroid daily  . Joint pain   . Multiple allergies    takes Zyrtec daily;uses Nasonex daily as needed  . Nocturia   . Numbness    weakness-right hand  . OSA on CPAP   . Pancreatitis   . Peripheral edema    takes Furosemide daily  . Shingles   . Urinary frequency   . Venous insufficiency of leg     Her Past Surgical History Is Significant  For: Past Surgical History:  Procedure Laterality Date  . ABDOMINAL HYSTERECTOMY     uterine prolapse  . BACK SURGERY     fusion  . CARDIAC CATHETERIZATION  2007   NORMAL; catheter-induced spasm  . CHOLECYSTECTOMY    . COLONOSCOPY    . ESOPHAGOGASTRODUODENOSCOPY  5-78yr ago  . FOOT SURGERY Bilateral   . SHOULDER ARTHROSCOPY WITH ROTATOR CUFF REPAIR AND SUBACROMIAL DECOMPRESSION Right 08/23/2013   Procedure: RIGHT SHOULDER ARTHROSCOPY WITH  SUBACROMIAL DECOMPRESSION DISTAL CLAVICLE RESECTION AND  ROTATOR CUFF REPAIR ;  Surgeon: KMarin Shutter MD;  [facility]  Service: Orthopedics;  Laterality: Right;  . TUBAL LIGATION      Her Family History Is Significant For: Family History  Problem Relation Age of Onset  . Hypertension Mother   . Hypertension Father   . Hypertension Sister   . Kidney disease  Sister     [facility] TRANSPLANT  . Thyroid disease Sister   . Breast cancer Maternal Aunt     >50; passed away from it  . Breast cancer Maternal Aunt     >50  . Thyroid disease Sister   . Hypertension Sister     Her Social History Is Significant For: Social History   Social History  . Marital status: Divorced    Spouse name: N/A  . Number of children: 2  . Years of education: College   Occupational History  .  [place]    [employer]   Social History Main Topics  . Smoking status: Former Research scientist (life sciences)  . Smokeless tobacco: Never Used     Comment: quit smoking in 1992  . Alcohol use 0.0 oz/week     Comment: rarely  . Drug use: No  . Sexual activity: Yes    Birth control/ protection: Surgical   Other Topics Concern  . None   Social History Narrative   Caffeine: Coffee    Her Allergies Are:  Allergies  Allergen Reactions  . Metronidazole     Developed pancreatitis after taking this  . Adhesive [Tape]   . Penicillins Rash    Has patient had a PCN reaction causing immediate rash, facial/tongue/throat swelling, SOB or lightheadedness with hypotension: No Has patient  had a PCN reaction causing severe rash involving mucus membranes or skin necrosis: No Has patient had a PCN reaction that required hospitalization No Has patient had a PCN reaction occurring within the last 10 years: No If all of the above answers are "NO", then may proceed with Cephalosporin use.  Marland Kitchen Vesicare [Solifenacin] Rash  :   Her Current Medications Are:  Outpatient Encounter Prescriptions as of 08/18/2015  Medication Sig  . albuterol (PROVENTIL HFA;VENTOLIN HFA) 108 (90 BASE) MCG/ACT inhaler Inhale 1-2 puffs into the lungs every 6 (six) hours as needed for wheezing or shortness of breath.  Marland Kitchen aspirin EC 81 MG tablet Take 81 mg by mouth every other day.   . cetirizine (ZYRTEC) 10 MG tablet Take 10 mg by mouth daily.  . diazepam (VALIUM) 5 MG tablet Take 0.5-1 tablets (2.5-5 mg total) by mouth every 6 (six) hours as needed for muscle spasms or sedation.  . docusate sodium (COLACE) 100 MG capsule Take 100 mg by mouth daily.   Marland Kitchen esomeprazole (NEXIUM) 40 MG capsule Take 40 mg by mouth daily at 12 noon.  . Fish Oil OIL Take 1 capsule by mouth daily.  . furosemide (LASIX) 20 MG tablet TAKE 1 TABLET BY MOUTH ONCE DAILY  . gabapentin (NEURONTIN) 300 MG capsule Take 300 mg by mouth 2 (two) times daily.   Marland Kitchen HYDROcodone-acetaminophen (NORCO) 10-325 MG per tablet Take 1 tablet by mouth every 6 (six) hours as needed for severe pain.  Marland Kitchen levothyroxine (SYNTHROID, LEVOTHROID) 75 MCG tablet Take 75 mcg by mouth daily before breakfast.  . meloxicam (MOBIC) 15 MG tablet Take 15 mg by mouth daily.  . metoprolol tartrate (LOPRESSOR) 25 MG tablet Take 25 mg by mouth 2 (two) times daily.  . mometasone (NASONEX) 50 MCG/ACT nasal spray Place 2 sprays into the nose daily as needed (allergies).  . neomycin-polymyxin-hydrocortisone (CORTISPORIN) otic solution Apply one to two drops to toe after soaking twice daily.  . ondansetron (ZOFRAN) 4 MG tablet Take 1 tablet (4 mg total) by mouth every 8 (eight) hours as  needed for nausea or vomiting.  Marland Kitchen oxyCODONE-acetaminophen (PERCOCET) 5-325 MG per tablet Take 1-2 tablets  by mouth every 4 (four) hours as needed.  . Pitavastatin Calcium (LIVALO) 2 MG TABS Take 0.5 tablets (1 mg total) by mouth daily.  Vladimir Faster Glycol-Propyl Glycol (SYSTANE OP) Place 1 drop into both eyes daily as needed (dry eyes).   . valsartan-hydrochlorothiazide (DIOVAN-HCT) 160-25 MG per tablet Take 1 tablet by mouth daily.  . [DISCONTINUED] lubiprostone (AMITIZA) 8 MCG capsule Take 8 mcg by mouth daily with breakfast.  . [DISCONTINUED] promethazine (PHENERGAN) 25 MG tablet Take 1 tablet (25 mg total) by mouth every 8 (eight) hours as needed for nausea or vomiting.   No facility-administered encounter medications on file as of 08/18/2015.   :  Review of Systems:  Out of a complete 14 point review of systems, all are reviewed and negative with the exception of these symptoms as listed below: Review of Systems  Neurological:       Patient states that she is doing ok with CPAP. Sometimes the nasal pillows make her nose tender.     Objective:  Neurologic Exam  Physical Exam Physical Examination:   Vitals:   08/18/15 1608  BP: 116/60  Pulse: 68  Resp: 16    General Examination: The patient is a very pleasant 55 y.o. female in no acute distress. She appears well-developed and well-nourished and well groomed. She is obese.    HEENT: Normocephalic, atraumatic, pupils are equal, round and reactive to light and accommodation. Funduscopic exam is normal with sharp disc margins noted. Extraocular tracking is good without limitation to gaze excursion or nystagmus noted. Normal smooth pursuit is noted. Hearing is grossly intact. Tympanic membranes are clear bilaterally. Face is symmetric with normal facial animation and normal facial sensation. Speech is clear with no dysarthria noted. There is no hypophonia. There is no lip, neck/head, jaw or voice tremor. Neck is supple with full range  of passive and active motion. There are no carotid bruits on auscultation. Oropharynx exam reveals: moderate mouth dryness, and she has hyperpigmentation in her mouth. She has adequate dental hygiene. She has a minimal overbite. She has moderate airway crowding secondary to larger tongue, smaller airway entry and redundant soft palate. Tonsils are small, 1+ bilaterally. Neck circumference is 15-5/8 inches.    Chest: Clear to auscultation without wheezing, rhonchi or crackles noted.  Heart: S1+S2+0, regular and normal without murmurs, rubs or gallops noted.   Abdomen: Soft, non-tender and non-distended with normal bowel sounds appreciated on auscultation.  Extremities: There is trace pitting edema in the distal lower extremities bilaterally. Pedal pulses are intact.  Skin: Warm and dry without trophic changes noted. There are no varicose veins.  Musculoskeletal: exam reveals no obvious joint deformities, tenderness or joint swelling or erythema.   Neurologically:  Mental status: The patient is awake, alert and oriented in all 4 spheres. Her immediate and remote memory, attention, language skills and fund of knowledge are appropriate. There is no evidence of aphasia, agnosia, apraxia or anomia. Speech is clear with normal prosody and enunciation. Thought process is linear. Mood is normal and affect is normal.  Cranial nerves II - XII are as described above under HEENT exam. In addition: shoulder shrug is normal with equal shoulder height noted. Motor exam: Normal bulk, strength and tone is noted. There is no drift, tremor or rebound. Romberg is negative. Reflexes are 2+ throughout. Babinski: Toes are extensor bilaterally. Fine motor skills and coordination: intact with normal finger taps, normal hand movements, normal rapid alternating patting, normal foot taps and normal foot agility.  Cerebellar  testing: No dysmetria or intention tremor on finger to nose testing. Heel to shin is unremarkable  bilaterally. There is no truncal or gait ataxia.  Sensory exam: intact to light touch in the upper and lower extremities.  Gait, station and balance: She stands easily. No veering to one side is noted. No leaning to one side is noted. Posture is age-appropriate and stance is narrow based. Gait shows normal stride length and normal pace. No problems turning are noted. She turns en bloc. Tandem walk is good today.    Assessment and Plan:   In summary, [name] is a very pleasant 55 year old female with an underlying medical history of hypertension, hyperlipidemia, diabetes, arthritis, hypothyroidism, reflux disease, history of pancreatitis, deemed secondary to medication, degenerative spine disease with low back pain with ESIs (last on 08/11/15 under Dr. Ernestina Patches), lower extremity edema and severe obesity, who presents for follow-up consultation of her severe obstructive sleep apnea, on CPAP therapy at 9 cm with full compliance and good results reported. Physical exam is stable. Sleep study test results from her split-night sleep study on 01/03/2015 were reviewed. She had an interim follow-up for compliance in January 2017 and was compliant at this time, she continues to be fully compliant with treatment and is commended for this. Furthermore, I explained her sleep study results in detail today.  I again explained in particular the risks and ramifications of untreated moderate to severe OSA, especially with respect to developing cardiovascular disease down the Road, including congestive heart failure, difficult to treat hypertension, cardiac arrhythmias, or stroke. Even type 2 diabetes has, in part, been linked to untreated OSA. Symptoms of untreated OSA include daytime sleepiness, memory problems, mood irritability and mood disorder such as depression and anxiety, lack of energy, as well as recurrent headaches, especially morning headaches. We talked about trying to maintain a healthy lifestyle in  general, as well as the importance of weight control. She is advised to drink more water, and allow for enough sleep time at night, in addition, she is advised not to skip any nights with CPAP and try to keep it on all night if possible. I explained the importance of being compliant with PAP treatment, not only for insurance purposes but primarily to improve Her symptoms, and for the patient's long term health benefit, including to reduce Her cardiovascular risks. I suggested a one-year checkup as she is doing well from my end of things. I answered all her questions today and she was in agreement.  I spent 25 minutes in total face-to-face time with the patient, more than 50% of which was spent in counseling and coordination of care, reviewing test results, reviewing medication and discussing or reviewing the diagnosis of OSA, its prognosis and treatment options.

## 2015-08-19 ENCOUNTER — Ambulatory Visit: Payer: 59 | Admitting: Nurse Practitioner

## 2015-08-20 ENCOUNTER — Other Ambulatory Visit: Payer: Self-pay | Admitting: *Deleted

## 2015-08-20 ENCOUNTER — Other Ambulatory Visit: Payer: Self-pay | Admitting: Cardiovascular Disease

## 2015-08-20 DIAGNOSIS — Z5181 Encounter for therapeutic drug level monitoring: Secondary | ICD-10-CM

## 2015-08-20 LAB — BASIC METABOLIC PANEL WITH GFR
BUN: 16 mg/dL (ref 7–25)
CALCIUM: 9 mg/dL (ref 8.6–10.4)
CO2: 30 mmol/L (ref 20–31)
CREATININE: 0.78 mg/dL (ref 0.50–1.05)
Chloride: 101 mmol/L (ref 98–110)
GFR, Est Non African American: 86 mL/min (ref >=60)
GLUCOSE: 114 mg/dL — AB (ref 65–99)
Potassium: 4.3 mmol/L (ref 3.5–5.3)
Sodium: 140 mmol/L (ref 135–146)

## 2015-09-08 DIAGNOSIS — R339 Retention of urine, unspecified: Secondary | ICD-10-CM | POA: Diagnosis not present

## 2015-09-08 DIAGNOSIS — M4806 Spinal stenosis, lumbar region: Secondary | ICD-10-CM | POA: Diagnosis not present

## 2015-09-08 DIAGNOSIS — G4733 Obstructive sleep apnea (adult) (pediatric): Secondary | ICD-10-CM | POA: Diagnosis not present

## 2015-09-11 ENCOUNTER — Other Ambulatory Visit: Payer: Self-pay | Admitting: Internal Medicine

## 2015-09-11 DIAGNOSIS — Z1231 Encounter for screening mammogram for malignant neoplasm of breast: Secondary | ICD-10-CM

## 2015-10-06 DIAGNOSIS — Z13820 Encounter for screening for osteoporosis: Secondary | ICD-10-CM | POA: Diagnosis not present

## 2015-10-06 DIAGNOSIS — M5136 Other intervertebral disc degeneration, lumbar region: Secondary | ICD-10-CM | POA: Diagnosis not present

## 2015-10-06 DIAGNOSIS — Z6841 Body Mass Index (BMI) 40.0 and over, adult: Secondary | ICD-10-CM | POA: Diagnosis not present

## 2015-10-06 DIAGNOSIS — E119 Type 2 diabetes mellitus without complications: Secondary | ICD-10-CM | POA: Diagnosis not present

## 2015-10-06 DIAGNOSIS — E784 Other hyperlipidemia: Secondary | ICD-10-CM | POA: Diagnosis not present

## 2015-10-06 DIAGNOSIS — I10 Essential (primary) hypertension: Secondary | ICD-10-CM | POA: Diagnosis not present

## 2015-10-06 DIAGNOSIS — Z780 Asymptomatic menopausal state: Secondary | ICD-10-CM | POA: Diagnosis not present

## 2015-10-20 ENCOUNTER — Ambulatory Visit: Payer: 59

## 2015-11-04 ENCOUNTER — Encounter (INDEPENDENT_AMBULATORY_CARE_PROVIDER_SITE_OTHER): Payer: Self-pay

## 2015-11-07 ENCOUNTER — Encounter (INDEPENDENT_AMBULATORY_CARE_PROVIDER_SITE_OTHER): Payer: Self-pay

## 2015-11-10 ENCOUNTER — Ambulatory Visit
Admission: RE | Admit: 2015-11-10 | Discharge: 2015-11-10 | Disposition: A | Discharge status: Home / Self Care | Payer: 59 | Source: Ambulatory Visit | Attending: Internal Medicine | Admitting: Internal Medicine

## 2015-11-10 DIAGNOSIS — Z1231 Encounter for screening mammogram for malignant neoplasm of breast: Secondary | ICD-10-CM | POA: Diagnosis not present

## 2015-11-19 ENCOUNTER — Ambulatory Visit (INDEPENDENT_AMBULATORY_CARE_PROVIDER_SITE_OTHER): Payer: 59 | Admitting: Physical Medicine and Rehabilitation

## 2015-11-19 ENCOUNTER — Encounter (INDEPENDENT_AMBULATORY_CARE_PROVIDER_SITE_OTHER): Payer: Self-pay | Admitting: Physical Medicine and Rehabilitation

## 2015-11-19 VITALS — BP 99/55

## 2015-11-19 DIAGNOSIS — M461 Sacroiliitis, not elsewhere classified: Secondary | ICD-10-CM

## 2015-11-19 DIAGNOSIS — M961 Postlaminectomy syndrome, not elsewhere classified: Secondary | ICD-10-CM | POA: Diagnosis not present

## 2015-11-19 DIAGNOSIS — G894 Chronic pain syndrome: Secondary | ICD-10-CM

## 2015-11-19 DIAGNOSIS — M5416 Radiculopathy, lumbar region: Secondary | ICD-10-CM | POA: Diagnosis not present

## 2015-11-19 NOTE — Progress Notes (Signed)
Office Visit Note   Patient: [name]           Date of Birth: [birth year]           MRN: [id number] Visit Date: 11/19/2015              Requested by: Velna Hatchet, MD [place], [place] [address] PCP: Velna Hatchet, MD   Assessment & Plan: Visit Diagnoses:  1. Sacroiliitis ([place])   2. Post laminectomy syndrome   3. Lumbar radiculopathy   4. Chronic pain syndrome     Plan: She has this chronic history of low back and buttock and hamstring pain. She is status post lower lumbar fusion for several years which did help some of the radicular pain at the beginning but she continues to have a lot of symptoms with standing and walking. 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 We have completed S1 transforaminal injections with relief for a short while. She doesn't really get numbness tingling or paresthesias and I think this may be related to the sacroiliac joints bilaterally. She is fused to the sacrum.  I am going to try diagnostic and hopefully therapeutic sacroiliac joint injections. Depending on the relief with that we might look at potential radiofrequency ablation. We could look at focused physical therapy or chiropractic care for the SI joints. She also might be a candidate for prolotherapy. If she doesn't get good relief are probably going increase her Lyrica. She is on 100 mg twice a day and tolerating that quite well and does seem to help. She takes very minimal amount of hydrocodone. I also think she would be a good candidate for spinal cord stimulator trial. This is a recalcitrant problem and she really has tried everything else.  Follow-Up Instructions: Return for bilateral SI joint injections.   Orders:  No orders of the defined types were placed in this encounter.  No orders of the defined types were placed in this encounter.     Procedures: No procedures performed   Clinical Data: No additional  findings.   Subjective: Chief Complaint  Patient presents with  . Lower Back - Pain    Back Pain  This is a recurrent problem. The current episode started more than 1 year ago. The problem occurs constantly. The problem is unchanged. The pain is present in the lumbar spine. The quality of the pain is described as aching. The pain radiates to the right thigh and left thigh. The pain is at a severity of 4/10. The pain is severe. Pertinent negatives include no abdominal pain, chest pain, fever or headaches.  [name] is well known to [place] over the last couple years. She gets intermittent flareups for which we do fill out FMLA papers for her. She has had prior lumbar fusion. She's had no weakness of lower limbs but when she walks she does get pain into the posterior buttock and hamstring which does limit her ability to walk. She will have to find a place to sit. She does take Lyrica 100 mg twice a day and this seems to be helping some. She takes infrequent hydrocodone was really flared up. She underwent ablation with some relief of the pain but really the referral pattern is more in the buttock and hamstring. Bilateral S1 transfer L injections helped temporarily. We have discussed spinal cord stimulator trial in the past that she should start sure she wants to look at that at this point. We have had updated  imaging is really not a issue with adjacent level stenosis yet but unfortunately she may have that way.  Review of Systems  Constitutional: Negative for chills, fatigue, fever and unexpected weight change.  HENT: Negative for sore throat and trouble swallowing.   Eyes: Negative for photophobia and visual disturbance.  Respiratory: Negative for chest tightness and shortness of breath.   Cardiovascular: Negative for chest pain.  Gastrointestinal: Negative for abdominal pain.  Endocrine: Negative for cold intolerance and heat intolerance.  Musculoskeletal: Positive for back pain. Negative for  myalgias.  Skin: Negative for color change and rash.  Neurological: Negative for speech difficulty and headaches.  Psychiatric/Behavioral: Negative for confusion. The patient is not nervous/anxious.   All other systems reviewed and are negative.    Objective: Vital Signs: BP (!) 99/55 (BP Location: Right Arm, Patient Position: Sitting, Cuff Size: Large)   Physical Exam  Constitutional: She is oriented to person, place, and time. She appears well-developed and well-nourished. No distress.  HENT:  Head: Normocephalic and atraumatic.  Nose: Nose normal.  Mouth/Throat: Oropharynx is clear and moist.  Eyes: Conjunctivae are normal. Pupils are equal, round, and reactive to light.  Neck: Normal range of motion. Neck supple.  Cardiovascular: Regular rhythm and intact distal pulses.   Pulmonary/Chest: Effort normal and breath sounds normal.  Abdominal: She exhibits no distension.  Neurological: She is alert and oriented to person, place, and time.  Skin: Skin is warm.  Psychiatric: She has a normal mood and affect. Her behavior is normal.  Nursing note and vitals reviewed.   Ortho Exam The patient ambulates without aid with a slightly wide-based gait. She does have a positive Fortin finger test bilaterally. She has a negative slump test bilaterally. She is stiff to the lumbar spine with no paraspinal tenderness. No pain over the greater trochanters. No pain with hip rotation. Distal strength bilaterally. Specialty Comments:  No specialty comments available.  Imaging: No results found.   PMFS History: Patient Active Problem List   Diagnosis Date Noted  . OSA on CPAP 02/19/2015  . Morbid obesity ([place]) 10/09/2014  . Pulmonary HTN 05/12/2013  . Bilateral lower extremity edema 05/12/2013  . Morbid obesity with BMI of 50.0-59.9, adult ([place])   . Back pain, hx of prior back surgery 12/09/2011  . Pancreatitis, mild, CT negative 12/08/2011  . Fatty liver 12/08/2011  . Substernal chest  pain, suspected secondary to acute pancreatitis 12/06/2011  . Hypertension 12/06/2011  . Hypokalemia 12/06/2011  . Hypothyroidism 12/06/2011  . GERD (gastroesophageal reflux disease) 12/06/2011  . Anxiety 12/06/2011  . BV (bacterial vaginosis)   . Hyperlipidemia    Past Medical History:  Diagnosis Date  . Anemia when she was young  . Arthritis   . Congestion of nasal sinus   . Constipation    takes Colace every other day  . DDD (degenerative disc disease), lumbar   . Diabetes mellitus without complication (HCC)    borderline  . Dry eyes    uses Systane Eye drops daily as needed  . GERD (gastroesophageal reflux disease) 12/06/2011   takes Nexium daily  . Headache(784.0)    occasionally-d/t congestion  . History of bronchitis 6-7 yrs ago  . Hyperlipidemia   . Hypertension    takes Metoprolol and Diovan daily  . Hypothyroidism 12/06/2011   takes Synthroid daily  . Joint pain   . Multiple allergies    takes Zyrtec daily;uses Nasonex daily as needed  . Nocturia   . Numbness    weakness-right  hand  . OSA on CPAP   . Pancreatitis   . Peripheral edema    takes Furosemide daily  . Shingles   . Urinary frequency   . Venous insufficiency of leg     Family History  Problem Relation Age of Onset  . Hypertension Mother   . Hypertension Father   . Hypertension Sister   . Kidney disease Sister     [facility] TRANSPLANT  . Thyroid disease Sister   . Thyroid disease Sister   . Hypertension Sister   . Breast cancer Maternal Aunt     >50; passed away from it  . Breast cancer Maternal Aunt     >50    Past Surgical History:  Procedure Laterality Date  . ABDOMINAL HYSTERECTOMY     uterine prolapse  . BACK SURGERY     fusion  . CARDIAC CATHETERIZATION  2007   NORMAL; catheter-induced spasm  . CHOLECYSTECTOMY    . COLONOSCOPY    . ESOPHAGOGASTRODUODENOSCOPY  5-78yrs ago  . FOOT SURGERY Bilateral   . SHOULDER ARTHROSCOPY WITH ROTATOR CUFF REPAIR AND SUBACROMIAL DECOMPRESSION  Right 08/23/2013   Procedure: RIGHT SHOULDER ARTHROSCOPY WITH  SUBACROMIAL DECOMPRESSION DISTAL CLAVICLE RESECTION AND  ROTATOR CUFF REPAIR ;  Surgeon: Marin Shutter, MD;  Location: [facility];  Service: Orthopedics;  Laterality: Right;  . TUBAL LIGATION     Social History   Occupational History  .  [employer]    [employer]   Social History Main Topics  . Smoking status: Former Research scientist (life sciences)  . Smokeless tobacco: Never Used     Comment: quit smoking in 1992  . Alcohol use 0.0 oz/week     Comment: rarely  . Drug use: No  . Sexual activity: Yes    Birth control/ protection: Surgical

## 2015-11-27 ENCOUNTER — Encounter (INDEPENDENT_AMBULATORY_CARE_PROVIDER_SITE_OTHER): Payer: Self-pay | Admitting: Physical Medicine and Rehabilitation

## 2015-11-27 ENCOUNTER — Ambulatory Visit (INDEPENDENT_AMBULATORY_CARE_PROVIDER_SITE_OTHER): Payer: 59 | Admitting: Physical Medicine and Rehabilitation

## 2015-11-27 VITALS — BP 102/58 | HR 53 | Temp 98.2°F

## 2015-11-27 DIAGNOSIS — M461 Sacroiliitis, not elsewhere classified: Secondary | ICD-10-CM

## 2015-11-27 NOTE — Progress Notes (Signed)
Office Visit Note  Patient: [name]           Date of Birth: 06/01/1960           MRN: [id number] Visit Date: 11/27/2015              Requested by: Velna Hatchet, MD [place], [place] [address] PCP: Velna Hatchet, MD   Assessment & Plan: Visit Diagnoses:  1. Sacroiliitis, not elsewhere classified [facility]     Follow-Up Instructions: Return if symptoms worsen or fail to improve.  Orders:  Orders Placed This Encounter  Procedures  . Small Joint Injection/Arthrocentesis  . Small Joint Injection/Arthrocentesis    No orders of the defined types were placed in this encounter.     Procedures: No notes on file   Other Procedures: Sacroiliac joint injection with fluoroscopic guidance Date/Time: 11/27/2015 4:24 PM Performed by: Magnus Sinning Authorized by: Magnus Sinning   Consent Given by:  Patient Site marked: the procedure site was marked   Timeout: prior to procedure the correct patient, procedure, and site was verified   Indications:  Pain Location:  Sacroiliac Site:  L sacroiliac joint Prep: patient was prepped and draped in usual sterile fashion   Needle Size:  22 G Spinal Needle: Yes   Approach:  Posterior inferior Fluoroscopic Guidance: Yes   Medications:  1 mL bupivacaine 0.5 %; 80 mg methylPREDNISolone acetate 80 MG/ML Aspiration Attempted: No   Patient tolerance:  Patient tolerated the procedure well with no immediate complications Sacroiliac joint injection with fluoroscopic guidance Date/Time: 11/27/2015 4:25 PM Performed by: Magnus Sinning Authorized by: Magnus Sinning   Consent Given by:  Patient Site marked: the procedure site was marked   Timeout: prior to procedure the correct patient, procedure, and site was verified   Indications:  Pain Location:  Sacroiliac Site:  R sacroiliac joint Prep: patient was prepped and draped in usual sterile fashion   Needle Size:  22 G Spinal Needle: Yes   Approach:  Posterior  inferior Fluoroscopic Guidance: Yes   Medications:  1 mL bupivacaine 0.5 %; 80 mg methylPREDNISolone acetate 80 MG/ML Aspiration Attempted: No   Patient tolerance:  Patient tolerated the procedure well with no immediate complications  There was excellent flow of contrast producing a partial arthrogram of both sacroiliac joints. There was flow of contrast in the lower third of the joint.   Clinical Data: No additional findings.   Subjective: Chief Complaint  Patient presents with  . Lower Back - Pain    HPI  [name] is a 55 year old female who recently sought evaluation and management. Please see that note for further details and justification. We are going to complete bilateral sacroiliac joint injections.  No allergy to dye  No driver  Review of Systems   Objective: Vital Signs: BP (!) 102/58 (BP Location: Right Wrist, Patient Position: Sitting, Cuff Size: Normal)   Pulse (!) 53   Temp 98.2 F (36.8 C) (Oral)    Physical Exam General appearance: NAD, conversant  Psych: Appropriate affect, alert and oriented to person, place and time  Eyes: anicteric sclerae, moist conjunctivae; no lid-lag; PERRLA Lungs: normal respiratory effort and no intercostal retractions, no wheezing CVA: normal pulses Extremities: No peripheral edema  Skin: Normal temperature, turgor and texture; no rash, ulcers or subcutaneous nodules MSK:/Neuro:   On manual muscle testing there is 5/5 strength in the distal muscle groups of the lower extremities bilaterally without deficits. There is no clonus test bilaterally.  She  has positive forward finger test bilaterally Ortho Exam  Specialty Comments:  No specialty comments available. Imaging: No results found.   PMFS History: Patient Active Problem List   Diagnosis Date Noted  . OSA on CPAP 02/19/2015  . Morbid obesity ([place]) 10/09/2014  . Pulmonary HTN 05/12/2013  . Bilateral lower extremity edema 05/12/2013  . Morbid obesity with BMI  of 50.0-59.9, adult ([place])   . Back pain, hx of prior back surgery 12/09/2011  . Pancreatitis, mild, CT negative 12/08/2011  . Fatty liver 12/08/2011  . Substernal chest pain, suspected secondary to acute pancreatitis 12/06/2011  . Hypertension 12/06/2011  . Hypokalemia 12/06/2011  . Hypothyroidism 12/06/2011  . GERD (gastroesophageal reflux disease) 12/06/2011  . Anxiety 12/06/2011  . BV (bacterial vaginosis)   . Hyperlipidemia    Past Medical History:  Diagnosis Date  . Anemia when she was young  . Arthritis   . Congestion of nasal sinus   . Constipation    takes Colace every other day  . DDD (degenerative disc disease), lumbar   . Diabetes mellitus without complication (HCC)    borderline  . Dry eyes    uses Systane Eye drops daily as needed  . GERD (gastroesophageal reflux disease) 12/06/2011   takes Nexium daily  . Headache(784.0)    occasionally-d/t congestion  . History of bronchitis 6-7 yrs ago  . Hyperlipidemia   . Hypertension    takes Metoprolol and Diovan daily  . Hypothyroidism 12/06/2011   takes Synthroid daily  . Joint pain   . Multiple allergies    takes Zyrtec daily;uses Nasonex daily as needed  . Nocturia   . Numbness    weakness-right hand  . OSA on CPAP   . Pancreatitis   . Peripheral edema    takes Furosemide daily  . Shingles   . Urinary frequency   . Venous insufficiency of leg     Family History  Problem Relation Age of Onset  . Hypertension Mother   . Hypertension Father   . Hypertension Sister   . Kidney disease Sister     [facility] TRANSPLANT  . Thyroid disease Sister   . Thyroid disease Sister   . Hypertension Sister   . Breast cancer Maternal Aunt     >50; passed away from it  . Breast cancer Maternal Aunt     >50   Past Surgical History:  Procedure Laterality Date  . ABDOMINAL HYSTERECTOMY     uterine prolapse  . BACK SURGERY     fusion  . CARDIAC CATHETERIZATION  2007   NORMAL; catheter-induced spasm  . CHOLECYSTECTOMY     . COLONOSCOPY    . ESOPHAGOGASTRODUODENOSCOPY  5-27yrs ago  . FOOT SURGERY Bilateral   . SHOULDER ARTHROSCOPY WITH ROTATOR CUFF REPAIR AND SUBACROMIAL DECOMPRESSION Right 08/23/2013   Procedure: RIGHT SHOULDER ARTHROSCOPY WITH  SUBACROMIAL DECOMPRESSION DISTAL CLAVICLE RESECTION AND  ROTATOR CUFF REPAIR ;  Surgeon: Marin Shutter, MD;  Location: [facility];  Service: Orthopedics;  Laterality: Right;  . TUBAL LIGATION     Social History   Occupational History  .  [place]    [employer]   Social History Main Topics  . Smoking status: Former Research scientist (life sciences)  . Smokeless tobacco: Never Used     Comment: quit smoking in 1992  . Alcohol use 0.0 oz/week     Comment: rarely  . Drug use: No  . Sexual activity: Yes    Birth control/ protection: Surgical

## 2015-11-27 NOTE — Patient Instructions (Signed)
[facility] Discharge Instructions  *At any time if you have questions or concerns they can be answered by calling [phone]  All Patients: . You may experience an increase in your symptoms for the first 2 days (it can take 2 days to 2 weeks for the steroid/cortisone to have its maximal effect). . You may use ice to the site for the first 24 hours; 20 minutes on and 20 minutes off and may use heat after that time. . You may resume and continue your current pain medications. If you need a refill please contact the prescribing physician. . You may resume her regular medications if any were stopped for the procedure. . You may shower but no swimming, tub bath or Jacuzzi for 24 hours. . Please remove bandage after 4 hours. . You may resume light activities as tolerated. . You should not drive for the next 3 hours due to anesthetics used in the procedure. Please have someone drive for you.  *If you have had sedation, Valium, Xanax, or lorazepam: Do not drive or use public transportation for 24 hours, do not operating hazardous machinery or make important personal/business decisions for 24 hours.  POSSIBLE STEROID SIDE EFFECTS: If experienced these should only last for a short period. Change in menstrual flow  Edema in (swelling)  Increased appetite Skin flushing (redness)  Skin rash/acne  Thrush (oral) Vaginitis    Increased sweating  Depression Increased blood glucose levels Cramping and leg/calf  Euphoria (feeling happy)  POSSIBLE PROCEDURE SIDE EFFECTS: Please call our office if concerned. Increased pain Increased numbness/tingling  Headache Nausea/vomiting Hematoma (bruising/bleeding) Edema (swelling at the site) Weakness  Infection (red/drainage at site) Fever greater than 100.30F  *In the event of a headache after epidural steroid injection: Drink plenty of fluids, especially water and try to lay flat when possible. If the headache does not get better after a few  days or as always if concerned please call the office.   Sacroiliac Joint Dysfunction Sacroiliac joint dysfunction is a condition that causes inflammation on one or both sides of the sacroiliac (SI) joint. The SI joint connects the lower part of the spine (sacrum) with the two upper portions of the pelvis (ilium). This condition causes deep aching or burning pain in the low back. In some cases, the pain may also spread into one or both buttocks or hips or spread down the legs. CAUSES This condition may be caused by:  Pregnancy. During pregnancy, extra stress is put on the SI joints because the pelvis widens.  Injury, such as:  Car accidents.  Sport-related injuries.  Work-related injuries.  Having one leg that is shorter than the other.  Conditions that affect the joints, such as:  Rheumatoid arthritis.  Gout.  Psoriatic arthritis.  Joint infection (septic arthritis). Sometimes, the cause of SI joint dysfunction is not known. SYMPTOMS Symptoms of this condition include:  Aching or burning pain in the lower back. The pain may also spread to other areas, such as:  Buttocks.  Groin.  Thighs and legs.  Muscle spasms in or around the painful areas.  Increased pain when standing, walking, running, stair climbing, bending, or lifting. DIAGNOSIS Your health care provider will do a physical exam and take your medical history. During the exam, the health care provider may move one or both of your legs to different positions to check for pain. Various tests may be done to help verify the diagnosis, including:  Imaging tests to look for other causes of pain.  These may include:  MRI.  CT scan.  Bone scan.  Diagnostic injection. A numbing medicine is injected into the SI joint using a needle. If the pain is temporarily improved or stopped after the injection, this can indicate that SI joint dysfunction is the problem. TREATMENT Treatment may vary depending on the cause and  severity of your condition. Treatment options may include:  Applying ice or heat to the lower back area. This can help to reduce pain and muscle spasms.  Medicines to relieve pain or inflammation or to relax the muscles.  Wearing a back brace (sacroiliac brace) to help support the joint while your back is healing.  Physical therapy to increase muscle strength around the joint and flexibility at the joint. This may also involve learning proper body positions and ways of moving to relieve stress on the joint.  Direct manipulation of the SI joint.  Injections of steroid medicine into the joint in order to reduce pain and swelling.  Radiofrequency ablation to burn away nerves that are carrying pain messages from the joint.  Use of a device that provides electrical stimulation in order to reduce pain at the joint.  Surgery to put in screws and plates that limit or prevent joint motion. This is rare. HOME CARE INSTRUCTIONS  Rest as needed. Limit your activities as directed by your health care provider.  Take medicines only as directed by your health care provider.  If directed, apply ice to the affected area:  Put ice in a plastic bag.  Place a towel between your skin and the bag.  Leave the ice on for 20 minutes, 2-3 times per day.  Use a heating pad or a moist heat pack as directed by your health care provider.  Exercise as directed by your health care provider or physical therapist.  Keep all follow-up visits as directed by your health care provider. This is important. SEEK MEDICAL CARE IF:  Your pain is not controlled with medicine.  You have a fever.  You have increasingly severe pain. SEEK IMMEDIATE MEDICAL CARE IF:  You have weakness, numbness, or tingling in your legs or feet.  You lose control of your bladder or bowel.   This information is not intended to replace advice given to you by your health care provider. Make sure you discuss any questions you have with  your health care provider.   Document Released: 04/09/2008 Document Revised: 05/28/2014 Document Reviewed: 09/18/2013 Elsevier Interactive Patient Education [employer].

## 2015-11-28 MED ORDER — METHYLPREDNISOLONE ACETATE 80 MG/ML IJ SUSP
80.0000 mg | INTRAMUSCULAR | Status: AC | PRN
  Administered 2015-11-27: 80 mg via INTRA_ARTICULAR

## 2015-11-28 MED ORDER — BUPIVACAINE HCL 0.5 % IJ SOLN
1.0000 mL | INTRAMUSCULAR | Status: AC | PRN
  Administered 2015-11-27: 1 mL via INTRA_ARTICULAR

## 2015-12-03 ENCOUNTER — Encounter: Payer: Self-pay | Admitting: Cardiovascular Disease

## 2015-12-03 DIAGNOSIS — R7303 Prediabetes: Secondary | ICD-10-CM | POA: Insufficient documentation

## 2015-12-06 DIAGNOSIS — Z0100 Encounter for examination of eyes and vision without abnormal findings: Secondary | ICD-10-CM | POA: Diagnosis not present

## 2015-12-23 ENCOUNTER — Other Ambulatory Visit (INDEPENDENT_AMBULATORY_CARE_PROVIDER_SITE_OTHER): Payer: Self-pay | Admitting: Physical Medicine and Rehabilitation

## 2015-12-29 ENCOUNTER — Other Ambulatory Visit: Payer: Self-pay | Admitting: *Deleted

## 2015-12-29 MED ORDER — FUROSEMIDE 20 MG PO TABS: ORAL_TABLET | ORAL | 1 refills | Status: DC

## 2015-12-29 NOTE — Telephone Encounter (Signed)
Patient stated she had been told to take her Lasix 20 mg 1 and 1/2 tablets daily She is completely out of medication at this time Discussed with Suanne Marker B PA and ok to fill early Patients blood pressure 102/70 and she does c/o getting dizzy at times. Advised to try taking the Lasix 20 mg daily and ok to use extra 1/2 as needed. Discuss with Dr Sallyanne Kuster next week when he is back in the office, verbalized understanding

## 2015-12-29 NOTE — Telephone Encounter (Signed)
Agree MCr 

## 2015-12-30 ENCOUNTER — Telehealth (INDEPENDENT_AMBULATORY_CARE_PROVIDER_SITE_OTHER): Payer: Self-pay | Admitting: Physical Medicine and Rehabilitation

## 2015-12-30 DIAGNOSIS — M792 Neuralgia and neuritis, unspecified: Secondary | ICD-10-CM

## 2015-12-30 NOTE — Telephone Encounter (Signed)
Patient is still taking gabapentin twice a day. She is not at home and is unsure of her Lyrica dose. She is going to check and call me back.

## 2015-12-30 NOTE — Telephone Encounter (Signed)
Can you ask her how she is taking right now as [facility] has her taking 100mg  just one time daily, also ask if she is still taking gabapentin as it is also listed, once I know those I can change rx

## 2015-12-31 ENCOUNTER — Telehealth: Payer: Self-pay

## 2015-12-31 MED ORDER — VALSARTAN 160 MG PO TABS: 160.0000 mg | ORAL_TABLET | Freq: Every day | ORAL | 3 refills | Status: DC

## 2015-12-31 MED ORDER — PREGABALIN 150 MG PO CAPS: 150.0000 mg | ORAL_CAPSULE | Freq: Two times a day (BID) | ORAL | 2 refills | Status: DC

## 2015-12-31 NOTE — Telephone Encounter (Signed)
Patient reports lightheadedness x 2-3 days. No other complaints. Checked patient's BP-108/62, sugar-99. She had been taking Furosemide 20 mg - 1.5 tablets daily per MCr's instructions in addition to Metoprolol 25 BID and Valsartan-HCT 160-25 QD.  Discussed patient with MCr. "She's paying more attention to the sodium in her diet and has lost some weight. How about we switch her from Valsartan-HCT to straight Valsartan 160 mg QD."  Reported recommendations to patient. Patient verbalized understanding and agreed with plan.  Medications updated. New prescription sent to patient's preferred pharmacy electronically for a 90-day supply.

## 2015-12-31 NOTE — Telephone Encounter (Signed)
Sorry for hte back and forth but she should stop the gabapentin, she can just stop since taking the Lyrica. She has the option of taking the Lyrica 100mg  TID or we can switch to 150mg  bid. Let me know.

## 2015-12-31 NOTE — Telephone Encounter (Signed)
[facility] considers Lyrica as scheduled, so I printed, needs to be faxed

## 2015-12-31 NOTE — Telephone Encounter (Signed)
Prescription called to patient's preferred pharmacy.

## 2016-01-06 DIAGNOSIS — J019 Acute sinusitis, unspecified: Secondary | ICD-10-CM | POA: Diagnosis not present

## 2016-01-06 DIAGNOSIS — Z6841 Body Mass Index (BMI) 40.0 and over, adult: Secondary | ICD-10-CM | POA: Diagnosis not present

## 2016-01-12 DIAGNOSIS — Z6841 Body Mass Index (BMI) 40.0 and over, adult: Secondary | ICD-10-CM | POA: Diagnosis not present

## 2016-01-12 DIAGNOSIS — J329 Chronic sinusitis, unspecified: Secondary | ICD-10-CM | POA: Diagnosis not present

## 2016-01-12 DIAGNOSIS — J019 Acute sinusitis, unspecified: Secondary | ICD-10-CM | POA: Diagnosis not present

## 2016-01-12 DIAGNOSIS — J45998 Other asthma: Secondary | ICD-10-CM | POA: Diagnosis not present

## 2016-01-14 ENCOUNTER — Ambulatory Visit (HOSPITAL_COMMUNITY)
Admission: EM | Admit: 2016-01-14 | Discharge: 2016-01-14 | Disposition: A | Discharge status: Nursing Home (Includes ICF) | Payer: 59 | Attending: Emergency Medicine | Admitting: Emergency Medicine

## 2016-01-14 ENCOUNTER — Emergency Department (HOSPITAL_COMMUNITY): Payer: 59

## 2016-01-14 ENCOUNTER — Ambulatory Visit (INDEPENDENT_AMBULATORY_CARE_PROVIDER_SITE_OTHER): Payer: 59

## 2016-01-14 ENCOUNTER — Encounter (HOSPITAL_COMMUNITY): Payer: Self-pay | Admitting: Emergency Medicine

## 2016-01-14 ENCOUNTER — Encounter (HOSPITAL_COMMUNITY): Payer: Self-pay | Admitting: Vascular Surgery

## 2016-01-14 ENCOUNTER — Emergency Department (HOSPITAL_COMMUNITY)
Admission: EM | Admit: 2016-01-14 | Discharge: 2016-01-15 | Disposition: A | Discharge status: Home / Self Care | Payer: 59 | Attending: Emergency Medicine | Admitting: Emergency Medicine

## 2016-01-14 DIAGNOSIS — R0603 Acute respiratory distress: Secondary | ICD-10-CM

## 2016-01-14 DIAGNOSIS — J189 Pneumonia, unspecified organism: Secondary | ICD-10-CM | POA: Insufficient documentation

## 2016-01-14 DIAGNOSIS — Z7982 Long term (current) use of aspirin: Secondary | ICD-10-CM | POA: Diagnosis not present

## 2016-01-14 DIAGNOSIS — Z87891 Personal history of nicotine dependence: Secondary | ICD-10-CM | POA: Insufficient documentation

## 2016-01-14 DIAGNOSIS — Z79899 Other long term (current) drug therapy: Secondary | ICD-10-CM | POA: Insufficient documentation

## 2016-01-14 DIAGNOSIS — J810 Acute pulmonary edema: Secondary | ICD-10-CM | POA: Diagnosis not present

## 2016-01-14 DIAGNOSIS — E039 Hypothyroidism, unspecified: Secondary | ICD-10-CM | POA: Diagnosis not present

## 2016-01-14 DIAGNOSIS — Z91040 Latex allergy status: Secondary | ICD-10-CM | POA: Insufficient documentation

## 2016-01-14 DIAGNOSIS — I10 Essential (primary) hypertension: Secondary | ICD-10-CM | POA: Diagnosis not present

## 2016-01-14 DIAGNOSIS — R0602 Shortness of breath: Secondary | ICD-10-CM

## 2016-01-14 DIAGNOSIS — R05 Cough: Secondary | ICD-10-CM | POA: Diagnosis not present

## 2016-01-14 DIAGNOSIS — E119 Type 2 diabetes mellitus without complications: Secondary | ICD-10-CM | POA: Diagnosis not present

## 2016-01-14 DIAGNOSIS — R0600 Dyspnea, unspecified: Secondary | ICD-10-CM

## 2016-01-14 LAB — CBC
HEMATOCRIT: 37.7 % (ref 36.0–46.0)
HEMOGLOBIN: 12.1 g/dL (ref 12.0–15.0)
MCH: 26.4 pg (ref 26.0–34.0)
MCHC: 32.1 g/dL (ref 30.0–36.0)
MCV: 82.1 fL (ref 78.0–100.0)
Platelets: 340 10*3/uL (ref 150–400)
RBC: 4.59 MIL/uL (ref 3.87–5.11)
RDW: 15.3 % (ref 11.5–15.5)
WBC: 8.4 10*3/uL (ref 4.0–10.5)

## 2016-01-14 LAB — I-STAT TROPONIN, ED: Troponin i, poc: 0 ng/mL (ref 0.00–0.08)

## 2016-01-14 LAB — POCT I-STAT, CHEM 8
BUN: 15 mg/dL (ref 6–20)
CALCIUM ION: 1.1 mmol/L — AB (ref 1.15–1.40)
Chloride: 106 mmol/L (ref 101–111)
Creatinine, Ser: 0.8 mg/dL (ref 0.44–1.00)
Glucose, Bld: 92 mg/dL (ref 65–99)
HEMATOCRIT: 38 % (ref 36.0–46.0)
Hemoglobin: 12.9 g/dL (ref 12.0–15.0)
Potassium: 3.9 mmol/L (ref 3.5–5.1)
SODIUM: 143 mmol/L (ref 135–145)
TCO2: 25 mmol/L (ref 0–100)

## 2016-01-14 LAB — BASIC METABOLIC PANEL
ANION GAP: 9 (ref 5–15)
BUN: 14 mg/dL (ref 6–20)
CO2: 24 mmol/L (ref 22–32)
Calcium: 8.7 mg/dL — ABNORMAL LOW (ref 8.9–10.3)
Chloride: 107 mmol/L (ref 101–111)
Creatinine, Ser: 0.83 mg/dL (ref 0.44–1.00)
GFR calc Af Amer: >60 mL/min (ref >60)
GFR calc non Af Amer: >60 mL/min (ref >60)
GLUCOSE: 89 mg/dL (ref 65–99)
POTASSIUM: 3.7 mmol/L (ref 3.5–5.1)
Sodium: 140 mmol/L (ref 135–145)

## 2016-01-14 LAB — BRAIN NATRIURETIC PEPTIDE: B Natriuretic Peptide: 160.2 pg/mL — ABNORMAL HIGH (ref 0.0–100.0)

## 2016-01-14 MED ORDER — LEVOFLOXACIN IN D5W 750 MG/150ML IV SOLN
750.0000 mg | Freq: Once | INTRAVENOUS | Status: AC
  Administered 2016-01-15: 750 mg via INTRAVENOUS
  Filled 2016-01-14: qty 150

## 2016-01-14 NOTE — Discharge Instructions (Signed)
Go to the ER for further evaluation and management. °

## 2016-01-14 NOTE — ED Triage Notes (Signed)
Patient coughing frequently.  Noticed increase in cough when lying back for ekg as to when standing.  Patient's lower legs tight, pitting edema, 3 +.  Patient reports symptoms evolving over the past week.  Patient not getting any better.  Skin warm and dry.  Patient speaking in complete sentences.  Patient does get sob with activity.

## 2016-01-14 NOTE — ED Triage Notes (Signed)
Pt has been suffering from a cough for one week.  She was seen by PCP twice in the last week.  She was prescribed antibiotics, given a nebulizer treatment and inhalers for home.  She is not getting any better.  Pt is short of breath while speaking.

## 2016-01-14 NOTE — ED Notes (Signed)
Patient transported to X-ray 

## 2016-01-14 NOTE — ED Provider Notes (Signed)
CSN: [id number]     Arrival date & time 01/14/16  1715 History   First MD Initiated Contact with Patient 01/14/16 1801     Chief Complaint  Patient presents with  . Cough  . Shortness of Breath   (Consider location/radiation/quality/duration/timing/severity/associated sxs/prior Treatment) 55 year old female presents to clinic with a week long history of cough. Patient states she has been evaluated by her PCP twice, received, antibiotics, tessalon, and neb treatments without relief. She reports shortness of breath, increased exertional dyspnea, and orthopnea. Patient reports having increased leg swelling for the last two weeks with pain and tightness in her lower legs/feet. Patient denies fever or chills, has no known history of CHF or COPD.   The history is provided by the patient.    Past Medical History:  Diagnosis Date  . Anemia when she was young  . Arthritis   . Congestion of nasal sinus   . Constipation    takes Colace every other day  . DDD (degenerative disc disease), lumbar   . Diabetes mellitus without complication (HCC)    borderline  . Dry eyes    uses Systane Eye drops daily as needed  . GERD (gastroesophageal reflux disease) 12/06/2011   takes Nexium daily  . Headache(784.0)    occasionally-d/t congestion  . History of bronchitis 6-7 yrs ago  . Hyperlipidemia   . Hypertension    takes Metoprolol and Diovan daily  . Hypothyroidism 12/06/2011   takes Synthroid daily  . Joint pain   . Multiple allergies    takes Zyrtec daily;uses Nasonex daily as needed  . Nocturia   . Numbness    weakness-right hand  . OSA on CPAP   . Pancreatitis   . Peripheral edema    takes Furosemide daily  . Shingles   . Urinary frequency   . Venous insufficiency of leg    Past Surgical History:  Procedure Laterality Date  . ABDOMINAL HYSTERECTOMY     uterine prolapse  . BACK SURGERY     fusion  . CARDIAC CATHETERIZATION  2007   NORMAL; catheter-induced spasm  .  CHOLECYSTECTOMY    . COLONOSCOPY    . ESOPHAGOGASTRODUODENOSCOPY  5-43yrs ago  . FOOT SURGERY Bilateral   . SHOULDER ARTHROSCOPY WITH ROTATOR CUFF REPAIR AND SUBACROMIAL DECOMPRESSION Right 08/23/2013   Procedure: RIGHT SHOULDER ARTHROSCOPY WITH  SUBACROMIAL DECOMPRESSION DISTAL CLAVICLE RESECTION AND  ROTATOR CUFF REPAIR ;  Surgeon: Marin Shutter, MD;  Location: [facility];  Service: Orthopedics;  Laterality: Right;  . TUBAL LIGATION     Family History  Problem Relation Age of Onset  . Hypertension Mother   . Hypertension Father   . Hypertension Sister   . Kidney disease Sister     [facility] TRANSPLANT  . Thyroid disease Sister   . Thyroid disease Sister   . Hypertension Sister   . Breast cancer Maternal Aunt     >50; passed away from it  . Breast cancer Maternal Aunt     >50   Social History  Substance Use Topics  . Smoking status: Former Research scientist (life sciences)  . Smokeless tobacco: Never Used     Comment: quit smoking in 1992  . Alcohol use 0.0 oz/week     Comment: rarely   OB History    Gravida Para Term Preterm AB Living   2 2       2    SAB TAB Ectopic Multiple Live Births  Review of Systems  Constitutional: Positive for activity change and fatigue. Negative for appetite change, chills, diaphoresis and fever.  HENT: Positive for congestion. Negative for sinus pain and sinus pressure.   Respiratory: Positive for cough, chest tightness, shortness of breath and wheezing. Negative for apnea.   Cardiovascular: Negative for chest pain.  Gastrointestinal: Negative for abdominal pain, diarrhea, nausea and vomiting.  Neurological: Positive for weakness. Negative for dizziness.    Allergies  Metronidazole; Adhesive [tape]; Latex; Penicillins; and Vesicare [solifenacin]  Home Medications   Prior to Admission medications   Medication Sig Start Date End Date Taking? Authorizing Provider  albuterol (PROVENTIL HFA;VENTOLIN HFA) 108 (90 BASE) MCG/ACT inhaler Inhale 1-2 puffs into the  lungs every 6 (six) hours as needed for wheezing or shortness of breath. 10/09/14  Yes Mihai Croitoru, MD  aspirin EC 81 MG tablet Take 81 mg by mouth every other day.    Yes Historical Provider, MD  Fish Oil OIL Take 1 capsule by mouth daily.   Yes Historical Provider, MD  furosemide (LASIX) 20 MG tablet Take 1 tablet by mouth daily. Ok to take an extra 1/2 tablet as needed for swelling. 12/29/15  Yes Mihai Croitoru, MD  levothyroxine (SYNTHROID, LEVOTHROID) 75 MCG tablet Take 75 mcg by mouth daily before breakfast.   Yes Historical Provider, MD  meloxicam (MOBIC) 15 MG tablet Take 15 mg by mouth daily.   Yes Historical Provider, MD  metoprolol tartrate (LOPRESSOR) 25 MG tablet Take 25 mg by mouth 2 (two) times daily.   Yes Historical Provider, MD  pregabalin (LYRICA) 150 MG capsule Take 1 capsule (150 mg total) by mouth 2 (two) times daily. 12/31/15  Yes Magnus Sinning, MD  valsartan (DIOVAN) 160 MG tablet Take 1 tablet (160 mg total) by mouth daily. 12/31/15  Yes Mihai Croitoru, MD  cetirizine (ZYRTEC) 10 MG tablet Take 10 mg by mouth daily.    Historical Provider, MD  diazepam (VALIUM) 5 MG tablet Take 0.5-1 tablets (2.5-5 mg total) by mouth every 6 (six) hours as needed for muscle spasms or sedation. 08/23/13   Olivia Mackie Shuford, PA-C  docusate sodium (COLACE) 100 MG capsule Take 100 mg by mouth daily.     Historical Provider, MD  esomeprazole (NEXIUM) 40 MG capsule Take 40 mg by mouth daily at 12 noon.    Historical Provider, MD  HYDROcodone-acetaminophen (NORCO) 10-325 MG per tablet Take 1 tablet by mouth every 6 (six) hours as needed for severe pain.    Historical Provider, MD  mometasone (NASONEX) 50 MCG/ACT nasal spray Place 2 sprays into the nose daily as needed (allergies).    Historical Provider, MD  neomycin-polymyxin-hydrocortisone (CORTISPORIN) otic solution Apply one to two drops to toe after soaking twice daily. 08/21/13   Max T Hyatt, DPM  ondansetron (ZOFRAN) 4 MG tablet Take 1 tablet (4  mg total) by mouth every 8 (eight) hours as needed for nausea or vomiting. 08/23/13   Jenetta Loges, PA-C  oxyCODONE-acetaminophen (PERCOCET) 5-325 MG per tablet Take 1-2 tablets by mouth every 4 (four) hours as needed. 08/23/13   Olivia Mackie Shuford, PA-C  Pitavastatin Calcium (LIVALO) 2 MG TABS Take 0.5 tablets (1 mg total) by mouth daily. 01/10/13   Mihai Croitoru, MD  Polyethyl Glycol-Propyl Glycol (SYSTANE OP) Place 1 drop into both eyes daily as needed (dry eyes).     Historical Provider, MD   Meds Ordered and Administered this Visit  Medications - No data to display  BP (!) 182/106 (BP Location: Right Wrist)  Pulse 91   Temp 98.6 F (37 C) (Oral)   SpO2 96%  No data found.   Physical Exam  Constitutional: She is oriented to person, place, and time. She appears well-developed and well-nourished. She appears distressed.  HENT:  Head: Normocephalic.  Neck: Normal range of motion. No JVD present.  Cardiovascular: Normal rate, regular rhythm and normal heart sounds.   No murmur heard. Pulmonary/Chest: No accessory muscle usage. Tachypnea noted. She is in respiratory distress. She has rales in the right upper field, the right middle field, the right lower field, the left upper field, the left middle field and the left lower field.  Abdominal: Soft. Bowel sounds are normal. There is no tenderness.  Neurological: She is alert and oriented to person, place, and time.  Skin: Skin is warm and dry. Capillary refill takes less than 2 seconds. She is not diaphoretic. No pallor.  Nursing note and vitals reviewed.   Urgent Care Course   Clinical Course     Procedures (including critical care time)  Labs Review Labs Reviewed  POCT I-STAT, CHEM 8 - Abnormal; Notable for the following:       Result Value   Calcium, Ion 1.10 (*)    All other components within normal limits    Imaging Review Dg Chest 2 View  Result Date: 01/14/2016 CLINICAL DATA:  Cough, shortness of breath. Edema.  History of bronchitis, diabetes. EXAM: CHEST  2 VIEW COMPARISON:  CT chest July 09, 2015 FINDINGS: Patchy consolidation RIGHT lung. Diffuse mild interstitial prominence. Small bilateral pleural effusion. Cardiac silhouette is mildly enlarged, mediastinal silhouette is nonsuspicious. No pneumothorax. Large body habitus. Osseous structures are nonsuspicious. IMPRESSION: Interstitial and RIGHT alveolar airspace opacities concerning for bronchopneumonia, less likely pulmonary edema. Small pleural effusions. These results will be called to the ordering clinician or representative by the Radiologist Assistant, and communication documented in the zVision Dashboard Electronically Signed   By: Elon Alas M.D.   On: 01/14/2016 18:42   12 lead EKG reviewed: Normal sinus rhythm without elevation or ectopy Ventricular Rate: 78 Pr: 122 ms QRS: 82 ms QT/QTc394/449  Visual Acuity Review  Right Eye Distance:   Left Eye Distance:   Bilateral Distance:    Right Eye Near:   Left Eye Near:    Bilateral Near:         MDM   1. Shortness of breath   2. Acute pulmonary edema (HCC)   3. Respiratory distress    Acute respiratory distress with pulmonary effusion. Transfer to the ER for further evaluation and management.      Barnet Glasgow, NP 01/14/16 1900

## 2016-01-14 NOTE — ED Notes (Signed)
No carelink available.  opted to transport by shuttle to ed using wheelchair to and from car.  [place], np agreeable.

## 2016-01-14 NOTE — ED Provider Notes (Signed)
[name]   CSN: [id number] Arrival date & time: 01/14/16  1906  History   Chief Complaint Chief Complaint  Patient presents with  . Shortness of Breath  . Cough    HPI [name] is a 55 y.o. female.  HPI   Patient to the ER with PMH of hypertension, constipation, arthritis, anemia, GERD, headache, joint pain, hypothyroidism,  Pancreatitis, peripheral edema, shingles, hypokalemia. She has had SOB with a dry cough for 1 week. She was seen by her primary care doctor and was given a z-pack, tessalon pearls, and breathing tx- these were completed last Friday. She says that she continues to have cough and SOB. No hx of COPD. History of bronchitis.   She has not had N/V/D, no le swelling, back pain, headache, fevers, + fatigue and body aches.  Past Medical History:  Diagnosis Date  . Anemia when she was young  . Arthritis   . Congestion of nasal sinus   . Constipation    takes Colace every other day  . DDD (degenerative disc disease), lumbar   . Diabetes mellitus without complication (HCC)    borderline  . Dry eyes    uses Systane Eye drops daily as needed  . GERD (gastroesophageal reflux disease) 12/06/2011   takes Nexium daily  . Headache(784.0)    occasionally-d/t congestion  . History of bronchitis 6-7 yrs ago  . Hyperlipidemia   . Hypertension    takes Metoprolol and Diovan daily  . Hypothyroidism 12/06/2011   takes Synthroid daily  . Joint pain   . Multiple allergies    takes Zyrtec daily;uses Nasonex daily as needed  . Nocturia   . Numbness    weakness-right hand  . OSA on CPAP   . Pancreatitis   . Peripheral edema    takes Furosemide daily  . Shingles   . Urinary frequency   . Venous insufficiency of leg     Patient Active Problem List   Diagnosis Date Noted  . Prediabetes 12/03/2015  . OSA on CPAP 02/19/2015  . Morbid obesity ([place]) 10/09/2014  . Pulmonary HTN 05/12/2013  . Bilateral lower extremity edema 05/12/2013    . Morbid obesity with BMI of 50.0-59.9, adult ([place])   . Back pain, hx of prior back surgery 12/09/2011  . Pancreatitis, mild, CT negative 12/08/2011  . Fatty liver 12/08/2011  . Substernal chest pain, suspected secondary to acute pancreatitis 12/06/2011  . Hypertension 12/06/2011  . Hypokalemia 12/06/2011  . Hypothyroidism 12/06/2011  . GERD (gastroesophageal reflux disease) 12/06/2011  . Anxiety 12/06/2011  . BV (bacterial vaginosis)   . Hyperlipidemia     Past Surgical History:  Procedure Laterality Date  . ABDOMINAL HYSTERECTOMY     uterine prolapse  . BACK SURGERY     fusion  . CARDIAC CATHETERIZATION  2007   NORMAL; catheter-induced spasm  . CHOLECYSTECTOMY    . COLONOSCOPY    . ESOPHAGOGASTRODUODENOSCOPY  5-54yrs ago  . FOOT SURGERY Bilateral   . SHOULDER ARTHROSCOPY WITH ROTATOR CUFF REPAIR AND SUBACROMIAL DECOMPRESSION Right 08/23/2013   Procedure: RIGHT SHOULDER ARTHROSCOPY WITH  SUBACROMIAL DECOMPRESSION DISTAL CLAVICLE RESECTION AND  ROTATOR CUFF REPAIR ;  Surgeon: Marin Shutter, MD;  Location: [facility] [place];  Service: Orthopedics;  Laterality: Right;  . TUBAL LIGATION      OB History    Gravida Para Term Preterm AB Living   2 2       2    SAB TAB Ectopic Multiple Live Births  Home Medications    Prior to Admission medications   Medication Sig Start Date End Date Taking? Authorizing Provider  albuterol (PROVENTIL HFA;VENTOLIN HFA) 108 (90 BASE) MCG/ACT inhaler Inhale 1-2 puffs into the lungs every 6 (six) hours as needed for wheezing or shortness of breath. 10/09/14   Mihai Croitoru, MD  aspirin EC 81 MG tablet Take 81 mg by mouth every other day.     Historical Provider, MD  cetirizine (ZYRTEC) 10 MG tablet Take 10 mg by mouth daily.    Historical Provider, MD  diazepam (VALIUM) 5 MG tablet Take 0.5-1 tablets (2.5-5 mg total) by mouth every 6 (six) hours as needed for muscle spasms or sedation. 08/23/13   Olivia Mackie Shuford, PA-C  docusate sodium (COLACE)  100 MG capsule Take 100 mg by mouth daily.     Historical Provider, MD  esomeprazole (NEXIUM) 40 MG capsule Take 40 mg by mouth daily at 12 noon.    Historical Provider, MD  Fish Oil OIL Take 1 capsule by mouth daily.    Historical Provider, MD  furosemide (LASIX) 20 MG tablet Take 1 tablet by mouth daily. Ok to take an extra 1/2 tablet as needed for swelling. 12/29/15   Mihai Croitoru, MD  HYDROcodone-acetaminophen (NORCO) 10-325 MG per tablet Take 1 tablet by mouth every 6 (six) hours as needed for severe pain.    Historical Provider, MD  levofloxacin (LEVAQUIN) 500 MG tablet Take 1 tablet (500 mg total) by mouth daily. 01/15/16   Delos Haring, PA-C  levothyroxine (SYNTHROID, LEVOTHROID) 75 MCG tablet Take 75 mcg by mouth daily before breakfast.    Historical Provider, MD  meloxicam (MOBIC) 15 MG tablet Take 15 mg by mouth daily.    Historical Provider, MD  metoprolol tartrate (LOPRESSOR) 25 MG tablet Take 25 mg by mouth 2 (two) times daily.    Historical Provider, MD  mometasone (NASONEX) 50 MCG/ACT nasal spray Place 2 sprays into the nose daily as needed (allergies).    Historical Provider, MD  neomycin-polymyxin-hydrocortisone (CORTISPORIN) otic solution Apply one to two drops to toe after soaking twice daily. 08/21/13   Max T Hyatt, DPM  ondansetron (ZOFRAN) 4 MG tablet Take 1 tablet (4 mg total) by mouth every 8 (eight) hours as needed for nausea or vomiting. 08/23/13   Jenetta Loges, PA-C  oxyCODONE-acetaminophen (PERCOCET) 5-325 MG per tablet Take 1-2 tablets by mouth every 4 (four) hours as needed. 08/23/13   Olivia Mackie Shuford, PA-C  Pitavastatin Calcium (LIVALO) 2 MG TABS Take 0.5 tablets (1 mg total) by mouth daily. 01/10/13   Mihai Croitoru, MD  Polyethyl Glycol-Propyl Glycol (SYSTANE OP) Place 1 drop into both eyes daily as needed (dry eyes).     Historical Provider, MD  pregabalin (LYRICA) 150 MG capsule Take 1 capsule (150 mg total) by mouth 2 (two) times daily. 12/31/15   Magnus Sinning, MD   valsartan (DIOVAN) 160 MG tablet Take 1 tablet (160 mg total) by mouth daily. 12/31/15   Sanda Klein, MD    Family History Family History  Problem Relation Age of Onset  . Hypertension Mother   . Hypertension Father   . Hypertension Sister   . Kidney disease Sister     [facility] TRANSPLANT  . Thyroid disease Sister   . Thyroid disease Sister   . Hypertension Sister   . Breast cancer Maternal Aunt     >50; passed away from it  . Breast cancer Maternal Aunt     >50    Social History Social  History  Substance Use Topics  . Smoking status: Former Research scientist (life sciences)  . Smokeless tobacco: Never Used     Comment: quit smoking in 1992  . Alcohol use 0.0 oz/week     Comment: rarely     Allergies   Metronidazole; Adhesive [tape]; Latex; Penicillins; and Vesicare [solifenacin]   Review of Systems Review of Systems Review of Systems All other systems negative except as documented in the HPI. All pertinent positives and negatives as reviewed in the HPI.   Physical Exam Updated Vital Signs BP 158/93   Pulse 76   Temp 98.4 F (36.9 C) (Oral)   Resp 21   Ht 5\' 4"  (1.626 m)   Wt 135.6 kg   SpO2 97%   BMI 51.32 kg/m   Physical Exam  Constitutional: She appears well-developed and well-nourished. No distress.  HENT:  Head: Normocephalic and atraumatic.  Right Ear: Tympanic membrane and ear canal normal.  Left Ear: Tympanic membrane and ear canal normal.  Nose: Nose normal.  Mouth/Throat: Uvula is midline, oropharynx is clear and moist and mucous membranes are normal.  Eyes: Pupils are equal, round, and reactive to light.  Neck: Normal range of motion. Neck supple.  Cardiovascular: Normal rate and regular rhythm.   Pulmonary/Chest: Effort normal.  Coughing during exam. Mild wheezing  Abdominal: Soft.  No signs of abdominal distention  Musculoskeletal:  No LE swelling  Neurological: She is alert.  Acting at baseline  Skin: Skin is warm and dry. No rash noted.  Nursing Fischer  and vitals reviewed.    ED Treatments / Results  Labs (all labs ordered are listed, but only abnormal results are displayed) Labs Reviewed  BASIC METABOLIC PANEL - Abnormal; Notable for the following:       Result Value   Calcium 8.7 (*)    All other components within normal limits  BRAIN NATRIURETIC PEPTIDE - Abnormal; Notable for the following:    B Natriuretic Peptide 160.2 (*)    All other components within normal limits  CBC  I-STAT TROPOININ, ED    EKG  EKG Interpretation  Date/Time:  Wednesday January 14 2016 19:16:44 EST Ventricular Rate:  85 PR Interval:  124 QRS Duration: 74 QT Interval:  368 QTC Calculation: 437 R Axis:   48 Text Interpretation:  Normal sinus rhythm Normal ECG Confirmed by Lita Mains  MD, DAVID ([id number]) on 01/14/2016 10:38:35 PM       Radiology Dg Chest 2 View  Result Date: 01/14/2016 CLINICAL DATA:  Cough, shortness of breath. Edema. History of bronchitis, diabetes. EXAM: CHEST  2 VIEW COMPARISON:  CT chest July 09, 2015 FINDINGS: Patchy consolidation RIGHT lung. Diffuse mild interstitial prominence. Small bilateral pleural effusion. Cardiac silhouette is mildly enlarged, mediastinal silhouette is nonsuspicious. No pneumothorax. Large body habitus. Osseous structures are nonsuspicious.   IMPRESSION: Interstitial and RIGHT alveolar airspace opacities concerning for bronchopneumonia, less likely pulmonary edema. Small pleural effusions. These results will be called to the ordering clinician or representative by the Radiologist Assistant, and communication documented in the zVision Dashboard Electronically Signed   By: Elon Alas M.D.   On: 01/14/2016 18:42   Dg Chest 2 View  Result Date: 01/14/2016 CLINICAL DATA:  Nonproductive cough and dyspnea for 1 week EXAM: CHEST  2 VIEW COMPARISON:  None. FINDINGS: Multifocal patchy airspace opacities persist with little or no interval change allowing for differences in technique and inspiration. No  large effusion. Hilar and mediastinal contours are unchanged.  IMPRESSION: Persistent multifocal airspace opacities, suspicious for pneumonia.  Followup PA and lateral chest X-ray is recommended in 3-4 weeks following trial of antibiotic therapy to ensure resolution and exclude underlying malignancy.   Electronically Signed   By: Andreas Newport M.D.   On: 01/14/2016 22:41   Procedures Procedures (including critical care time)  Medications Ordered in ED Medications  levofloxacin (LEVAQUIN) IVPB 750 mg (750 mg Intravenous New Bag/Given 01/15/16 0011)  iopamidol (ISOVUE-300) 61 % injection (100 mLs  Contrast Given 01/15/16 0032)     Initial Impression / Assessment and Plan / ED Course  I have reviewed the triage vital signs and the nursing notes.  Pertinent labs & imaging results that were available during my care of the patient were reviewed by me and considered in my medical decision making (see chart for details).  Clinical Course     CT chest shows patient to have a multifocal pneumonia, asides from a mildly elevated WBC the patients labwork shows a mildly elevated BNP not to far from a previous visit. No hypoxia, she does not appear systemically ill. The Z-pack did not work therefore will give her Levaquin, strict return precautions and for her to follow-up with her primary care doctor.   Final Clinical Impressions(s) / ED Diagnoses   Final diagnoses:  Community acquired pneumonia, unspecified laterality    New Prescriptions New Prescriptions   LEVOFLOXACIN (LEVAQUIN) 500 MG TABLET    Take 1 tablet (500 mg total) by mouth daily.     Delos Haring, PA-C 01/15/16 [id number]    Everlene Balls, MD 01/15/16 [phone]

## 2016-01-14 NOTE — ED Notes (Signed)
Pt has 2+ edema bilaterally in both feet and 1+ edema throughout both calves.

## 2016-01-14 NOTE — ED Triage Notes (Signed)
Pt reports to the ED for eval of dry cough and SOB x 1 week. She was seen by her PCP and was given a Z-pack, Tessalon Pearls, and an breathing tx. however she has continued to have the cough and SOB. Denies any COPD. Has hx of bronchitis.

## 2016-01-14 NOTE — ED Notes (Signed)
ekg handed to lawrence, np

## 2016-01-15 ENCOUNTER — Emergency Department (HOSPITAL_COMMUNITY): Payer: 59

## 2016-01-15 DIAGNOSIS — J189 Pneumonia, unspecified organism: Secondary | ICD-10-CM | POA: Diagnosis not present

## 2016-01-15 DIAGNOSIS — Z7982 Long term (current) use of aspirin: Secondary | ICD-10-CM | POA: Diagnosis not present

## 2016-01-15 DIAGNOSIS — Z87891 Personal history of nicotine dependence: Secondary | ICD-10-CM | POA: Diagnosis not present

## 2016-01-15 DIAGNOSIS — Z91040 Latex allergy status: Secondary | ICD-10-CM | POA: Diagnosis not present

## 2016-01-15 DIAGNOSIS — J90 Pleural effusion, not elsewhere classified: Secondary | ICD-10-CM | POA: Diagnosis not present

## 2016-01-15 DIAGNOSIS — E039 Hypothyroidism, unspecified: Secondary | ICD-10-CM | POA: Diagnosis not present

## 2016-01-15 DIAGNOSIS — I10 Essential (primary) hypertension: Secondary | ICD-10-CM | POA: Diagnosis not present

## 2016-01-15 DIAGNOSIS — E119 Type 2 diabetes mellitus without complications: Secondary | ICD-10-CM | POA: Diagnosis not present

## 2016-01-15 DIAGNOSIS — Z79899 Other long term (current) drug therapy: Secondary | ICD-10-CM | POA: Diagnosis not present

## 2016-01-15 DIAGNOSIS — R0602 Shortness of breath: Secondary | ICD-10-CM | POA: Diagnosis not present

## 2016-01-15 MED ORDER — LEVOFLOXACIN 500 MG PO TABS: 500.0000 mg | ORAL_TABLET | Freq: Every day | ORAL | 0 refills | Status: DC

## 2016-01-15 MED ORDER — IOPAMIDOL (ISOVUE-300) INJECTION 61%
INTRAVENOUS | Status: AC
  Administered 2016-01-15: 100 mL
  Filled 2016-01-15: qty 100

## 2016-01-23 DIAGNOSIS — J189 Pneumonia, unspecified organism: Secondary | ICD-10-CM | POA: Diagnosis not present

## 2016-01-23 DIAGNOSIS — J45998 Other asthma: Secondary | ICD-10-CM | POA: Diagnosis not present

## 2016-01-23 DIAGNOSIS — Z6841 Body Mass Index (BMI) 40.0 and over, adult: Secondary | ICD-10-CM | POA: Diagnosis not present

## 2016-01-28 ENCOUNTER — Other Ambulatory Visit (HOSPITAL_COMMUNITY): Payer: Self-pay | Admitting: Surgery

## 2016-02-03 ENCOUNTER — Encounter: Payer: Self-pay | Admitting: Podiatry

## 2016-02-03 ENCOUNTER — Ambulatory Visit (INDEPENDENT_AMBULATORY_CARE_PROVIDER_SITE_OTHER): Payer: 59 | Admitting: Podiatry

## 2016-02-03 ENCOUNTER — Ambulatory Visit (INDEPENDENT_AMBULATORY_CARE_PROVIDER_SITE_OTHER): Payer: 59

## 2016-02-03 DIAGNOSIS — M79671 Pain in right foot: Secondary | ICD-10-CM

## 2016-02-03 DIAGNOSIS — M76821 Posterior tibial tendinitis, right leg: Secondary | ICD-10-CM | POA: Diagnosis not present

## 2016-02-03 MED ORDER — METHYLPREDNISOLONE 4 MG PO TBPK: ORAL_TABLET | ORAL | 0 refills | Status: DC

## 2016-02-03 NOTE — Progress Notes (Signed)
She presents today with a chief complaint of pain to the inside of the foot for the past several months. She denies any trauma. She points to the navicular tuberosity.  Objective: Vital signs are stable she is alert and oriented 3. Pulses are palpable. She has pain on direct palpation of the navicular tuberosity of the right foot. She has pain on end range of motion and inversion against resistance of the posterior tibial tendon. Radiographic evaluation does demonstrate some fluffy periostitis to the distal or anterior portion of the navicular bone on the tuberosity. No signs of fracture.  Assessment: This does not appear to be a fracture more a posterior tibial tendinitis.  Plan: I injected the site today with dexamethasone and local anesthetic and I will follow up with her in 1 month. Started her on a Medrol Dosepak to be followed by meloxicam.

## 2016-02-04 ENCOUNTER — Telehealth: Payer: Self-pay

## 2016-02-04 MED ORDER — POTASSIUM CHLORIDE CRYS ER 20 MEQ PO TBCR: 20.0000 meq | EXTENDED_RELEASE_TABLET | Freq: Every day | ORAL | 3 refills | Status: DC

## 2016-02-04 MED ORDER — FUROSEMIDE 20 MG PO TABS: 60.0000 mg | ORAL_TABLET | Freq: Every day | ORAL | 3 refills | Status: DC

## 2016-02-04 NOTE — Telephone Encounter (Signed)
Rx(s) sent to pharmacy electronically.  

## 2016-02-05 ENCOUNTER — Ambulatory Visit: Payer: 59 | Admitting: Podiatry

## 2016-02-11 ENCOUNTER — Ambulatory Visit (HOSPITAL_COMMUNITY): Payer: 59

## 2016-02-12 DIAGNOSIS — M48062 Spinal stenosis, lumbar region with neurogenic claudication: Secondary | ICD-10-CM | POA: Diagnosis not present

## 2016-02-12 DIAGNOSIS — R339 Retention of urine, unspecified: Secondary | ICD-10-CM | POA: Diagnosis not present

## 2016-02-12 DIAGNOSIS — G4733 Obstructive sleep apnea (adult) (pediatric): Secondary | ICD-10-CM | POA: Diagnosis not present

## 2016-02-17 ENCOUNTER — Encounter: Payer: 59 | Attending: Surgery | Admitting: Dietician

## 2016-02-17 DIAGNOSIS — Z713 Dietary counseling and surveillance: Secondary | ICD-10-CM | POA: Insufficient documentation

## 2016-02-17 DIAGNOSIS — Z6841 Body Mass Index (BMI) 40.0 and over, adult: Secondary | ICD-10-CM

## 2016-02-17 NOTE — Progress Notes (Signed)
  Pre-Op Assessment Visit:  Pre-Operative Sleeve gastrectomy Surgery  Medical Nutrition Therapy:  Appt start time: [id number]   End time:  530.  Patient was seen on 02/17/2016 for Pre-Operative Nutrition Assessment. Assessment and letter of approval faxed to [facility] Bariatric Surgery Program coordinator on 02/18/2016.   Preferred Learning Style:   No preference indicated   Learning Readiness:   Ready  Handouts given during visit include:  Pre-Op Goals Bariatric Surgery Protein Shakes   During the appointment today the following Pre-Op Goals were reviewed with the patient: Maintain or lose weight as instructed by your surgeon Make healthy food choices Begin to limit portion sizes Limited concentrated sugars and fried foods Keep fat/sugar in the single digits per serving on   food labels Practice CHEWING your food  (aim for 30 chews per bite or until applesauce consistency) Practice not drinking 15 minutes before, during, and 30 minutes after each meal/snack Avoid all carbonated beverages  Avoid/limit caffeinated beverages  Avoid all sugar-sweetened beverages Consume 3 meals per day; eat every 3-5 hours Make a list of non-food related activities Aim for 64-100 ounces of FLUID daily  Aim for at least 60-80 grams of PROTEIN daily Look for a liquid protein source that contain ?15 g protein and ?5 g carbohydrate  (ex: shakes, drinks, shots)  Demonstrated degree of understanding via:  Teach Back  Teaching Method Utilized:  Visual Auditory Hands on  Barriers to learning/adherence to lifestyle change: none  Patient to call the [facility] to enroll in Pre-Op and Post-Op Nutrition Education when surgery date is scheduled.

## 2016-02-18 ENCOUNTER — Encounter: Payer: Self-pay | Admitting: Dietician

## 2016-02-23 ENCOUNTER — Other Ambulatory Visit (HOSPITAL_COMMUNITY): Payer: Self-pay | Admitting: Surgery

## 2016-02-23 ENCOUNTER — Ambulatory Visit (HOSPITAL_COMMUNITY)
Admission: RE | Admit: 2016-02-23 | Discharge: 2016-02-23 | Disposition: A | Discharge status: Home / Self Care | Payer: 59 | Source: Ambulatory Visit | Attending: Surgery | Admitting: Surgery

## 2016-02-23 ENCOUNTER — Other Ambulatory Visit: Payer: Self-pay

## 2016-02-23 ENCOUNTER — Ambulatory Visit (HOSPITAL_COMMUNITY): Admission: RE | Admit: 2016-02-23 | Payer: 59 | Source: Ambulatory Visit

## 2016-02-23 DIAGNOSIS — R9431 Abnormal electrocardiogram [ECG] [EKG]: Secondary | ICD-10-CM | POA: Insufficient documentation

## 2016-02-23 DIAGNOSIS — R918 Other nonspecific abnormal finding of lung field: Secondary | ICD-10-CM | POA: Insufficient documentation

## 2016-02-23 DIAGNOSIS — Z6841 Body Mass Index (BMI) 40.0 and over, adult: Secondary | ICD-10-CM | POA: Diagnosis not present

## 2016-02-23 DIAGNOSIS — I517 Cardiomegaly: Secondary | ICD-10-CM | POA: Insufficient documentation

## 2016-02-23 DIAGNOSIS — Z01818 Encounter for other preprocedural examination: Secondary | ICD-10-CM | POA: Diagnosis not present

## 2016-02-23 DIAGNOSIS — J9811 Atelectasis: Secondary | ICD-10-CM | POA: Diagnosis not present

## 2016-02-26 DIAGNOSIS — J309 Allergic rhinitis, unspecified: Secondary | ICD-10-CM | POA: Diagnosis not present

## 2016-02-26 DIAGNOSIS — Z6841 Body Mass Index (BMI) 40.0 and over, adult: Secondary | ICD-10-CM | POA: Diagnosis not present

## 2016-02-26 DIAGNOSIS — R05 Cough: Secondary | ICD-10-CM | POA: Diagnosis not present

## 2016-02-26 DIAGNOSIS — J189 Pneumonia, unspecified organism: Secondary | ICD-10-CM | POA: Diagnosis not present

## 2016-02-26 DIAGNOSIS — J111 Influenza due to unidentified influenza virus with other respiratory manifestations: Secondary | ICD-10-CM | POA: Diagnosis not present

## 2016-02-26 DIAGNOSIS — R509 Fever, unspecified: Secondary | ICD-10-CM | POA: Diagnosis not present

## 2016-03-04 ENCOUNTER — Ambulatory Visit: Payer: 59 | Admitting: Psychiatry

## 2016-03-11 ENCOUNTER — Telehealth (INDEPENDENT_AMBULATORY_CARE_PROVIDER_SITE_OTHER): Payer: Self-pay | Admitting: Physical Medicine and Rehabilitation

## 2016-03-12 NOTE — Telephone Encounter (Signed)
Scheduled for 03/19/16 at 0930.

## 2016-03-12 NOTE — Telephone Encounter (Signed)
Yes ok 

## 2016-03-16 ENCOUNTER — Encounter: Payer: Self-pay | Admitting: Podiatry

## 2016-03-16 ENCOUNTER — Ambulatory Visit (INDEPENDENT_AMBULATORY_CARE_PROVIDER_SITE_OTHER): Payer: 59 | Admitting: Podiatry

## 2016-03-16 DIAGNOSIS — M76821 Posterior tibial tendinitis, right leg: Secondary | ICD-10-CM

## 2016-03-16 NOTE — Progress Notes (Signed)
She presents today for follow-up of her posterior tibial tendinitis of the right foot she states this really still hurting hasn't improved by much at all.  Objective: Vital signs stable she is alert and oriented 3 still has pain and tenderness on palpation of the posterior tibial tendon. Mild edema overlying this area.  Assessment: Painful posterior tibial tendinitis with left foot deformity. Mild plantar fasciitis.  Plan: She was scanned for set of orthotics today we did discuss the possible need for MRI and possible surgery she would like to hold off on making any decision until after the orthotics have come in.

## 2016-03-19 ENCOUNTER — Ambulatory Visit (INDEPENDENT_AMBULATORY_CARE_PROVIDER_SITE_OTHER): Payer: 59 | Admitting: Physical Medicine and Rehabilitation

## 2016-03-23 ENCOUNTER — Ambulatory Visit (INDEPENDENT_AMBULATORY_CARE_PROVIDER_SITE_OTHER): Payer: 59 | Admitting: Psychiatry

## 2016-03-23 DIAGNOSIS — F509 Eating disorder, unspecified: Secondary | ICD-10-CM

## 2016-03-29 DIAGNOSIS — Z6841 Body Mass Index (BMI) 40.0 and over, adult: Secondary | ICD-10-CM | POA: Diagnosis not present

## 2016-03-29 DIAGNOSIS — E784 Other hyperlipidemia: Secondary | ICD-10-CM | POA: Diagnosis not present

## 2016-03-29 DIAGNOSIS — R918 Other nonspecific abnormal finding of lung field: Secondary | ICD-10-CM | POA: Diagnosis not present

## 2016-03-29 DIAGNOSIS — Z1389 Encounter for screening for other disorder: Secondary | ICD-10-CM | POA: Diagnosis not present

## 2016-03-29 DIAGNOSIS — M79673 Pain in unspecified foot: Secondary | ICD-10-CM | POA: Diagnosis not present

## 2016-03-29 DIAGNOSIS — I872 Venous insufficiency (chronic) (peripheral): Secondary | ICD-10-CM | POA: Diagnosis not present

## 2016-03-29 DIAGNOSIS — I10 Essential (primary) hypertension: Secondary | ICD-10-CM | POA: Diagnosis not present

## 2016-03-29 DIAGNOSIS — E119 Type 2 diabetes mellitus without complications: Secondary | ICD-10-CM | POA: Diagnosis not present

## 2016-03-29 DIAGNOSIS — M25512 Pain in left shoulder: Secondary | ICD-10-CM | POA: Diagnosis not present

## 2016-03-30 DIAGNOSIS — G4733 Obstructive sleep apnea (adult) (pediatric): Secondary | ICD-10-CM | POA: Diagnosis not present

## 2016-03-30 DIAGNOSIS — R339 Retention of urine, unspecified: Secondary | ICD-10-CM | POA: Diagnosis not present

## 2016-03-31 ENCOUNTER — Encounter (INDEPENDENT_AMBULATORY_CARE_PROVIDER_SITE_OTHER): Payer: Self-pay | Admitting: Physical Medicine and Rehabilitation

## 2016-03-31 ENCOUNTER — Ambulatory Visit (INDEPENDENT_AMBULATORY_CARE_PROVIDER_SITE_OTHER): Payer: 59 | Admitting: Physical Medicine and Rehabilitation

## 2016-03-31 ENCOUNTER — Ambulatory Visit (INDEPENDENT_AMBULATORY_CARE_PROVIDER_SITE_OTHER): Payer: Self-pay

## 2016-03-31 VITALS — BP 148/84

## 2016-03-31 DIAGNOSIS — G8929 Other chronic pain: Secondary | ICD-10-CM | POA: Diagnosis not present

## 2016-03-31 DIAGNOSIS — M961 Postlaminectomy syndrome, not elsewhere classified: Secondary | ICD-10-CM | POA: Diagnosis not present

## 2016-03-31 DIAGNOSIS — M25512 Pain in left shoulder: Secondary | ICD-10-CM

## 2016-03-31 DIAGNOSIS — G894 Chronic pain syndrome: Secondary | ICD-10-CM | POA: Diagnosis not present

## 2016-03-31 DIAGNOSIS — M461 Sacroiliitis, not elsewhere classified: Secondary | ICD-10-CM

## 2016-03-31 NOTE — Progress Notes (Signed)
[name] - 56 y.o. female MRN [id number]  Date of birth: [birth year]  Office Visit Note: Visit Date: 03/31/2016 PCP: Velna Hatchet, MD Referred by: Velna Hatchet, MD  Subjective: Chief Complaint  Patient presents with  . Lower Back - Pain  . Left Shoulder - Pain   HPI: [name] is a 56 year old female who we seen on several occasions for her back and buttock pain. She was originally sent to [place] through Dr. Durward Fortes a year so ago. Her case is complicated by morbid obesity and prior lumbar fusion. She has x-ray evidence of sclerotic changes of the sacroiliac joints bilaterally showing sacroiliitis. She has got decent relief with sacroiliac joint injections in the past. She is also gotten some relief with ablation of the facet joints below the fusion. She comes in today with chronic worsening severe pain in the lower back into buttocks. Worse on left side. She reports that this is mainly while at work while sitting. Difficulty getting moving in the morning. She reports difficulty going from sitting to standing. I don't think she has had evaluation by rheumatology. She denies any pain down the legs or paresthesias. She denies any focal weakness. She is reporting some pain that travels to the mid upper back. She says this is worse when she sits at the desk at her job for a long time. She does sit at her desk for quite a bit of time. She has been using Lyrica with good relief but is stating that with her insurance that has become difficult to the price. She is asking if we have discount cards. We have tried her on Topamax before and it did seem to help but she did not really tolerate this very well.  Also complaining of left shoulder pain for 6 to 7 months. Was coming and going and now is pretty persistent. Denies pain radiating down arm. Worse with laying down on left side and with lifting. She reports more anterior pain along the biceps tendon area or before meals joint area. She denies  any right-sided complaints. She does not have any frank neck pain or radicular complaints down the arms. No paresthesias in the hands. She has had prior right rotator cuff surgery by Dr. Justice Britain. This was in 2015.  On positive noted she is going to undergo gastric bypass type surgery in the next few months.    Review of Systems  Constitutional: Negative for chills, fever, malaise/fatigue and weight loss.  HENT: Negative for hearing loss and sinus pain.   Eyes: Negative for blurred vision, double vision and photophobia.  Respiratory: Negative for cough and shortness of breath.   Cardiovascular: Negative for chest pain, palpitations and leg swelling.  Gastrointestinal: Negative for abdominal pain, nausea and vomiting.  Genitourinary: Negative for flank pain.  Musculoskeletal: Positive for back pain and joint pain. Negative for myalgias.  Skin: Negative for itching and rash.  Neurological: Negative for tremors, focal weakness and weakness.  Endo/Heme/Allergies: Negative.   Psychiatric/Behavioral: Negative for depression.  All other systems reviewed and are negative.  Otherwise per HPI.  Assessment & Plan: Visit Diagnoses:  1. Sacroiliitis ([place])   2. Chronic left shoulder pain   3. Post laminectomy syndrome   4. Chronic pain syndrome     Plan: Findings:  Chronic worsening severe low back and buttock pain and left more than right consistent with either sacroiliac joint dysfunction and sacroiliitis as well as potential for S1 or L5 nerve root type symptoms although she gets  no referral in the legs. MRI from a couple years ago that showed left-sided foraminal encroachment from facet arthropathy. Patient did somewhat well with facet joint block in the past but less well with ablation. Ablation was very difficult due to her body habitus. We're going to repeat the sacroiliac joint injections today bilaterally due to her bilateral pain. She does have x-ray evidence of sclerotic changes here.  Consideration the future for rheumatology evaluation. We did give her some information on Lyrica. She can go to the [employer] and download a card for $25 co-pays. She can also go to [employer] as  I've had a few patients get a fairly decent deals on medication there. I'm encouraged with her weight loss surgery as well as a think that'll probably make a big impacting her life. I also encouraged her not to sit for very long at work. She needs to be up at least every 15 minutes for just even a few minutes. In terms of her shoulder pain I think this is some bursitis or tendinitis. She has decent range of motion with pain at end ranges. She has some pain about the biceps tendon. I want to send her to Dr. Durward Fortes once again who is seen her in the past for further evaluation of her left shoulder. X-rays were not obtained today.  Return to see Dr. Durward Fortes for Left shoulder eval.  Meds & Orders: No orders of the defined types were placed in this encounter.   Orders Placed This Encounter  Procedures  . Large Joint Injection/Arthrocentesis  . Large Joint Injection/Arthrocentesis  . XR C-ARM NO REPORT    Follow-up: Return if symptoms worsen or fail to improve.   Procedures: Large Joint Inj Date/Time: 03/31/2016 4:23 PM Performed by: Magnus Sinning Authorized by: Magnus Sinning   Consent Given by:  Patient Site marked: the procedure site was marked   Timeout: prior to procedure the correct patient, procedure, and site was verified   Indications:  Pain and diagnostic evaluation Location:  Sacroiliac Site:  R sacroiliac joint Prep: patient was prepped and draped in usual sterile fashion   Needle Size:  22 G Needle Length:  3.5 inches Approach:  Posterior Ultrasound Guidance: No   Fluoroscopic Guidance: Yes   Arthrogram: No   Medications:  2 mL bupivacaine 0.5 %; 40 mg triamcinolone acetonide 40 MG/ML Aspiration Attempted: No   Patient tolerance:  Patient tolerated the procedure well  with no immediate complications  There was excellent flow of contrast producing a partial arthrogram of the sacroiliac joint.  Large Joint Inj Date/Time: 03/31/2016 4:23 PM Performed by: Magnus Sinning Authorized by: Magnus Sinning   Consent Given by:  Patient Site marked: the procedure site was marked   Timeout: prior to procedure the correct patient, procedure, and site was verified   Indications:  Pain and diagnostic evaluation Location:  Sacroiliac Site:  L sacroiliac joint Prep: patient was prepped and draped in usual sterile fashion   Needle Size:  22 G Needle Length:  3.5 inches Approach:  Posterior Ultrasound Guidance: No   Fluoroscopic Guidance: Yes   Arthrogram: No   Medications:  2 mL bupivacaine 0.5 %; 40 mg triamcinolone acetonide 40 MG/ML Aspiration Attempted: No   Patient tolerance:  Patient tolerated the procedure well with no immediate complications  There was excellent flow of contrast producing a partial arthrogram of the sacroiliac joint.     No notes on file   Clinical History: Lumbar spine MRI dated 06/11/2014 IMPRESSION: Interval fusion  L3-4 L4-5. Posterior decompression at these levels without stenosis.  Progressive facet degeneration and mild spinal stenosis at L2-3.  Progressive facet degeneration on the left at L5-S1 with left foraminal encroachment.   She reports that she has quit smoking. She has never used smokeless tobacco. No results for input(s): HGBA1C, LABURIC in the last 8760 hours.  Objective:  VS:  HT:    WT:   BMI:     BP:(!) 148/84  HR: bpm  TEMP: ( )  RESP:  Physical Exam  Constitutional: She is oriented to person, place, and time. She appears well-developed and well-nourished.  Morbidly obese  Eyes: Conjunctivae and EOM are normal. Pupils are equal, round, and reactive to light.  Cardiovascular: Normal rate and intact distal pulses.   Pulmonary/Chest: Effort normal.  Musculoskeletal:  Patient is slow to rise from a  seated position. She ambulates without aid. She has good range of motion of both hips. She has a positive Fortin finger sign on the left more than right. She has no pain over the greater trochanter she has good distal strength. Examination of the left shoulder shows tenderness to palpation along the biceps tendon area anteriorly. The bulk around the shoulder is quite large however. May be some pain over the before meals joint. She has good internal and external rotation with pain at end ranges. She can abduct the arm with good strength. She does get pain with doing this. She has a clinically positive speeds test.  Neurological: She is alert and oriented to person, place, and time.  Skin: Skin is warm and dry. No rash noted. No erythema.  Psychiatric: She has a normal mood and affect. Her behavior is normal.  Nursing note and vitals reviewed.   Ortho Exam Imaging: No results found.  Past Medical/Family/Surgical/Social History: Medications & Allergies reviewed per EMR Patient Active Problem List   Diagnosis Date Noted  . Prediabetes 12/03/2015  . OSA on CPAP 02/19/2015  . Morbid obesity ([place]) 10/09/2014  . Pulmonary HTN 05/12/2013  . Bilateral lower extremity edema 05/12/2013  . Morbid obesity with BMI of 50.0-59.9, adult ([place])   . Back pain, hx of prior back surgery 12/09/2011  . Pancreatitis, mild, CT negative 12/08/2011  . Fatty liver 12/08/2011  . Substernal chest pain, suspected secondary to acute pancreatitis 12/06/2011  . Hypertension 12/06/2011  . Hypokalemia 12/06/2011  . Hypothyroidism 12/06/2011  . GERD (gastroesophageal reflux disease) 12/06/2011  . Anxiety 12/06/2011  . BV (bacterial vaginosis)   . Hyperlipidemia    Past Medical History:  Diagnosis Date  . Anemia when she was young  . Arthritis   . Congestion of nasal sinus   . Constipation    takes Colace every other day  . DDD (degenerative disc disease), lumbar   . Diabetes mellitus without complication (HCC)     borderline  . Dry eyes    uses Systane Eye drops daily as needed  . GERD (gastroesophageal reflux disease) 12/06/2011   takes Nexium daily  . Headache(784.0)    occasionally-d/t congestion  . History of bronchitis 6-7 yrs ago  . Hyperlipidemia   . Hypertension    takes Metoprolol and Diovan daily  . Hypothyroidism 12/06/2011   takes Synthroid daily  . Joint pain   . Multiple allergies    takes Zyrtec daily;uses Nasonex daily as needed  . Nocturia   . Numbness    weakness-right hand  . OSA on CPAP   . Pancreatitis   . Peripheral edema  takes Furosemide daily  . Shingles   . Urinary frequency   . Venous insufficiency of leg    Family History  Problem Relation Age of Onset  . Hypertension Mother   . Hypertension Father   . Hypertension Sister   . Kidney disease Sister     [facility] TRANSPLANT  . Thyroid disease Sister   . Thyroid disease Sister   . Hypertension Sister   . Breast cancer Maternal Aunt     >50; passed away from it  . Breast cancer Maternal Aunt     >50   Past Surgical History:  Procedure Laterality Date  . ABDOMINAL HYSTERECTOMY     uterine prolapse  . BACK SURGERY     fusion  . CARDIAC CATHETERIZATION  2007   NORMAL; catheter-induced spasm  . CHOLECYSTECTOMY    . COLONOSCOPY    . ESOPHAGOGASTRODUODENOSCOPY  5-63yrs ago  . FOOT SURGERY Bilateral   . SHOULDER ARTHROSCOPY WITH ROTATOR CUFF REPAIR AND SUBACROMIAL DECOMPRESSION Right 08/23/2013   Procedure: RIGHT SHOULDER ARTHROSCOPY WITH  SUBACROMIAL DECOMPRESSION DISTAL CLAVICLE RESECTION AND  ROTATOR CUFF REPAIR ;  Surgeon: Marin Shutter, MD;  Location: [facility];  Service: Orthopedics;  Laterality: Right;  . TUBAL LIGATION     Social History   Occupational History  .  [place] [employer]    [employer]   Social History Main Topics  . Smoking status: Former Research scientist (life sciences)  . Smokeless tobacco: Never Used     Comment: quit smoking in 1992  . Alcohol use 0.0 oz/week     Comment: rarely  . Drug use: No  . Sexual  activity: Yes    Birth control/ protection: Surgical

## 2016-03-31 NOTE — Patient Instructions (Signed)
[email or web address]  Check for pricing on [facility] Discharge Instructions  *At any time if you have questions or concerns they can be answered by calling [phone]  All Patients: . You may experience an increase in your symptoms for the first 2 days (it can take 2 days to 2 weeks for the steroid/cortisone to have its maximal effect). . You may use ice to the site for the first 24 hours; 20 minutes on and 20 minutes off and may use heat after that time. . You may resume and continue your current pain medications. If you need a refill please contact the prescribing physician. . You may resume her regular medications if any were stopped for the procedure. . You may shower but no swimming, tub bath or Jacuzzi for 24 hours. . Please remove bandage after 4 hours. . You may resume light activities as tolerated. . If you had Spine Injection, you should not drive for the next 3 hours due to anesthetics used in the procedure. Please have someone drive for you.  *If you have had sedation, Valium, Xanax, or lorazepam: Do not drive or use public transportation for 24 hours, do not operating hazardous machinery or make important personal/business decisions for 24 hours.  POSSIBLE STEROID SIDE EFFECTS: If experienced these should only last for a short period. Change in menstrual flow  Edema in (swelling)  Increased appetite Skin flushing (redness)  Skin rash/acne  Thrush (oral) Vaginitis    Increased sweating  Depression Increased blood glucose levels Cramping and leg/calf  Euphoria (feeling happy)  POSSIBLE PROCEDURE SIDE EFFECTS: Please call our office if concerned. Increased pain Increased numbness/tingling  Headache Nausea/vomiting Hematoma (bruising/bleeding) Edema (swelling at the site) Weakness  Infection (red/drainage at site) Fever greater than 100.59F  *In the event of a headache after epidural steroid injection: Drink plenty of fluids, especially water and try to  lay flat when possible. If the headache does not get better after a few days or as always if concerned please call the office.

## 2016-04-02 MED ORDER — BUPIVACAINE HCL 0.5 % IJ SOLN
2.0000 mL | INTRAMUSCULAR | Status: AC | PRN
  Administered 2016-03-31: 2 mL via INTRA_ARTICULAR

## 2016-04-02 MED ORDER — TRIAMCINOLONE ACETONIDE 40 MG/ML IJ SUSP
40.0000 mg | INTRAMUSCULAR | Status: AC | PRN
  Administered 2016-03-31: 40 mg via INTRA_ARTICULAR

## 2016-04-12 ENCOUNTER — Encounter (INDEPENDENT_AMBULATORY_CARE_PROVIDER_SITE_OTHER): Payer: Self-pay | Admitting: Orthopaedic Surgery

## 2016-04-12 ENCOUNTER — Ambulatory Visit (INDEPENDENT_AMBULATORY_CARE_PROVIDER_SITE_OTHER): Payer: 59 | Admitting: Orthopaedic Surgery

## 2016-04-12 ENCOUNTER — Ambulatory Visit (INDEPENDENT_AMBULATORY_CARE_PROVIDER_SITE_OTHER): Payer: Self-pay

## 2016-04-12 VITALS — BP 129/75 | HR 63 | Resp 14 | Ht 64.0 in | Wt 285.0 lb

## 2016-04-12 DIAGNOSIS — G8929 Other chronic pain: Secondary | ICD-10-CM

## 2016-04-12 DIAGNOSIS — M25512 Pain in left shoulder: Secondary | ICD-10-CM | POA: Diagnosis not present

## 2016-04-12 MED ORDER — LIDOCAINE HCL 1 % IJ SOLN
2.0000 mL | INTRAMUSCULAR | Status: AC | PRN
  Administered 2016-04-12: 2 mL

## 2016-04-12 MED ORDER — METHYLPREDNISOLONE ACETATE 40 MG/ML IJ SUSP
80.0000 mg | INTRAMUSCULAR | Status: AC | PRN
  Administered 2016-04-12: 80 mg

## 2016-04-12 MED ORDER — BUPIVACAINE HCL 0.5 % IJ SOLN
2.0000 mL | INTRAMUSCULAR | Status: AC | PRN
  Administered 2016-04-12: 2 mL via INTRA_ARTICULAR

## 2016-04-12 NOTE — Progress Notes (Signed)
Office Visit Note   Patient: [name]           Date of Birth: 19-Jun-1960           MRN: [id number] Visit Date: 04/12/2016              Requested by: Velna Hatchet, MD [place], [place] [address] PCP: Velna Hatchet, MD   Assessment & Plan: Visit Diagnoses: Chronic left shoulder pain with impingement   Plan: Discussion concerning potential diagnoses. Patient would prefer a subacromial cortisone injection and monitor her response.  Follow-Up Instructions: No Follow-up on file.   Orders:  No orders of the defined types were placed in this encounter.  No orders of the defined types were placed in this encounter.     Procedures: Large Joint Inj Date/Time: 04/12/2016 4:11 PM Performed by: Garald Balding Authorized by: Garald Balding   Consent Given by:  Patient Timeout: prior to procedure the correct patient, procedure, and site was verified   Indications:  Pain Location:  Shoulder Site:  L subacromial bursa Prep: patient was prepped and draped in usual sterile fashion   Needle Size:  25 G Needle Length:  1.5 inches Approach:  Lateral Ultrasound Guidance: No   Fluoroscopic Guidance: No   Arthrogram: No   Medications:  80 mg methylPREDNISolone acetate 40 MG/ML; 2 mL lidocaine 1 %; 2 mL bupivacaine 0.5 % Aspiration Attempted: No   Patient tolerance:  Patient tolerated the procedure well with no immediate complications     Clinical Data: No additional findings.   Subjective: No chief complaint on file.   [name] is a 56 year old female that presents with chronic left shoulder pain. She did have a lumbar injection from Dr. Ernestina Patches 03/31/16, but that did not help her shoulder pain. Dr. Ernestina Patches wanted PW to look at her shoulder and see if [name] needs an injection in her shoulder.   His Mancel Fischer relates that she had a rotator cuff tear repair several years ago for right shoulder and is done well. She first noted insidious onset of left  shoulder pain approximately 6 months ago. She now is reached a point where she is having difficulty raising her arm overhead and sleeping on that side. He denies any history of injury or trauma. Review of Systems   Objective: Vital Signs: There were no vitals taken for this visit.  Physical Exam  Ortho Exam morbid obesity. As of impingement and empty can testing left shoulder. No weakness. Multiple stretch marks about the left shoulder. No loss of motion but with painful overhead arc. No grating or crepitation. Good grip and released distally. Neck pain with range of motion.  Specialty Comments:  No specialty comments available.  Imaging: No results found.   PMFS History: Patient Active Problem List   Diagnosis Date Noted  . Prediabetes 12/03/2015  . OSA on CPAP 02/19/2015  . Morbid obesity ([place]) 10/09/2014  . Pulmonary HTN 05/12/2013  . Bilateral lower extremity edema 05/12/2013  . Morbid obesity with BMI of 50.0-59.9, adult ([place])   . Back pain, hx of prior back surgery 12/09/2011  . Pancreatitis, mild, CT negative 12/08/2011  . Fatty liver 12/08/2011  . Substernal chest pain, suspected secondary to acute pancreatitis 12/06/2011  . Hypertension 12/06/2011  . Hypokalemia 12/06/2011  . Hypothyroidism 12/06/2011  . GERD (gastroesophageal reflux disease) 12/06/2011  . Anxiety 12/06/2011  . BV (bacterial vaginosis)   . Hyperlipidemia    Past Medical History:  Diagnosis  Date  . Anemia when she was young  . Arthritis   . Congestion of nasal sinus   . Constipation    takes Colace every other day  . DDD (degenerative disc disease), lumbar   . Diabetes mellitus without complication (HCC)    borderline  . Dry eyes    uses Systane Eye drops daily as needed  . GERD (gastroesophageal reflux disease) 12/06/2011   takes Nexium daily  . Headache(784.0)    occasionally-d/t congestion  . History of bronchitis 6-7 yrs ago  . Hyperlipidemia   . Hypertension    takes Metoprolol  and Diovan daily  . Hypothyroidism 12/06/2011   takes Synthroid daily  . Joint pain   . Multiple allergies    takes Zyrtec daily;uses Nasonex daily as needed  . Nocturia   . Numbness    weakness-right hand  . OSA on CPAP   . Pancreatitis   . Peripheral edema    takes Furosemide daily  . Shingles   . Urinary frequency   . Venous insufficiency of leg     Family History  Problem Relation Age of Onset  . Hypertension Mother   . Hypertension Father   . Hypertension Sister   . Kidney disease Sister     [facility] TRANSPLANT  . Thyroid disease Sister   . Thyroid disease Sister   . Hypertension Sister   . Breast cancer Maternal Aunt     >50; passed away from it  . Breast cancer Maternal Aunt     >50    Past Surgical History:  Procedure Laterality Date  . ABDOMINAL HYSTERECTOMY     uterine prolapse  . BACK SURGERY     fusion  . CARDIAC CATHETERIZATION  2007   NORMAL; catheter-induced spasm  . CHOLECYSTECTOMY    . COLONOSCOPY    . ESOPHAGOGASTRODUODENOSCOPY  5-4yrs ago  . FOOT SURGERY Bilateral   . SHOULDER ARTHROSCOPY WITH ROTATOR CUFF REPAIR AND SUBACROMIAL DECOMPRESSION Right 08/23/2013   Procedure: RIGHT SHOULDER ARTHROSCOPY WITH  SUBACROMIAL DECOMPRESSION DISTAL CLAVICLE RESECTION AND  ROTATOR CUFF REPAIR ;  Surgeon: Marin Shutter, MD;  Location: [facility];  Service: Orthopedics;  Laterality: Right;  . TUBAL LIGATION     Social History   Occupational History  .  [employer]    [employer]   Social History Main Topics  . Smoking status: Former Research scientist (life sciences)  . Smokeless tobacco: Never Used     Comment: quit smoking in 1992  . Alcohol use 0.0 oz/week     Comment: rarely  . Drug use: No  . Sexual activity: Yes    Birth control/ protection: Surgical

## 2016-04-21 ENCOUNTER — Telehealth: Payer: Self-pay | Admitting: Neurology

## 2016-04-21 NOTE — Telephone Encounter (Signed)
Patient requesting letter from Dr. Rexene Alberts stating she is on CPAP addressed to [facility]. Best call back is [phone]

## 2016-04-26 NOTE — Telephone Encounter (Signed)
Patient states that she needs a letter addressed to [facility] stating that she has a CPAP machine and cannot risk having her power shut off. No other detail given to me.   Patient is aware that you are out of the office and is ok for this to be addressed next week.

## 2016-05-03 NOTE — Telephone Encounter (Signed)
I am not sure, what exactly we can state in a letter to [employer]. If there is a planned construction or power outages, she may have to stay overnight with a friend and take her CPAP with her. If there are unexpected power outages, no one can help that, not sure, what we can do, please find out.

## 2016-05-04 NOTE — Telephone Encounter (Signed)
I cannot write a generic letter without proper medical reasoning. It may be subject to privacy violation. Please explain to patient.

## 2016-05-04 NOTE — Telephone Encounter (Addendum)
I called pt. She reports that another physician already wrote the letter she needed for [employer].  However, for personal reasons, (pt would not tell me what this letter was for, other than she is "trying to get help") she needs a letter faxed to her that states that she has sleep apnea and is using a CPAP.  Pt works in [place] at [employer] and wants the letter faxed to [phone].  Ok to write and fax?

## 2016-05-04 NOTE — Telephone Encounter (Signed)
I called pt. I explained to her that  Dr. Rexene Alberts cannot write a generic letter without proper medical reasoning because this may be subject to a privacy violation. Pt says that she would be the only one viewing this letter. I advised her that she may request her medical records however, but we cannot fax any kind of medical information to any fax machine other than a physician office, insurance office, medical company, etc, due to privacy. Pt wants to speak to our MR to discuss obtaining her medical records. Will send to Hilda Blades to discuss with the pt.

## 2016-05-17 ENCOUNTER — Telehealth: Payer: Self-pay | Admitting: *Deleted

## 2016-05-17 NOTE — Telephone Encounter (Addendum)
Pt states she is having a problem with the orthotics digging into the bottom of her foot, and she would like them full length, due to the shoes rub the bottom of the foot if insoles have to be removed. I spoke with Velora Heckler, Pedorothist and he said to have pt bring in and he will get them sent off to be made full length. I asked pt if the orthotics were working for her otherwise and pt states yes. I told pt to bring to the [facility] office and I would give to [facility] to send off to be made full length.05/18/2016-Pt presents to office to have orthotics modified to full length, for better function and comfort. Pt states most likely to wear orthotics in [place]-type shoe women's 9 - 9 1/2, with the same top-cover or softer. I gave pt's orthotics to [employer], [facility], [facility] for processing.

## 2016-05-29 ENCOUNTER — Encounter (HOSPITAL_COMMUNITY): Payer: Self-pay | Admitting: Emergency Medicine

## 2016-05-29 ENCOUNTER — Ambulatory Visit (HOSPITAL_COMMUNITY)
Admission: EM | Admit: 2016-05-29 | Discharge: 2016-05-29 | Disposition: A | Discharge status: Home / Self Care | Payer: 59 | Attending: Internal Medicine | Admitting: Internal Medicine

## 2016-05-29 DIAGNOSIS — M25561 Pain in right knee: Secondary | ICD-10-CM

## 2016-05-29 DIAGNOSIS — M7651 Patellar tendinitis, right knee: Secondary | ICD-10-CM | POA: Diagnosis not present

## 2016-05-29 MED ORDER — TRAMADOL HCL 50 MG PO TABS: 50.0000 mg | ORAL_TABLET | Freq: Four times a day (QID) | ORAL | 0 refills | Status: DC | PRN

## 2016-05-29 NOTE — ED Provider Notes (Signed)
CSN: [id number]     Arrival date & time 05/29/16  1211 History   First MD Initiated Contact with Patient 05/29/16 1258     Chief Complaint  Patient presents with  . Knee Pain   (Consider location/radiation/quality/duration/timing/severity/associated sxs/prior Treatment) 56 year old morbidly obese female with a history of generalized arthritis but notes specific history of arthritis to the knees presents with right knee pain that started yesterday. It is worse with weightbearing. She states she has a job in which she sits most of the day but yesterday she had to do substantially more weightbearing and walking than usual. This caused her knee to hurt worse. Denies any known injury, fall or other trauma. She states she used ice yesterday and it felt a little better. It is better with no weightbearing and ice. The pain is located to the anterior knee just inferior to the patella. It is noteworthy that she has chronic swelling or enlargement of the right knee. She has had it for several years and is uncertain as to the reason. She believes it may have something to do with her thyroid. It is different in appearance and size within the left knee.      Past Medical History:  Diagnosis Date  . Anemia when she was young  . Arthritis   . Congestion of nasal sinus   . Constipation    takes Colace every other day  . DDD (degenerative disc disease), lumbar   . Diabetes mellitus without complication (HCC)    borderline  . Dry eyes    uses Systane Eye drops daily as needed  . GERD (gastroesophageal reflux disease) 12/06/2011   takes Nexium daily  . Headache(784.0)    occasionally-d/t congestion  . History of bronchitis 6-7 yrs ago  . Hyperlipidemia   . Hypertension    takes Metoprolol and Diovan daily  . Hypothyroidism 12/06/2011   takes Synthroid daily  . Joint pain   . Multiple allergies    takes Zyrtec daily;uses Nasonex daily as needed  . Nocturia   . Numbness    weakness-right hand  .  OSA on CPAP   . Pancreatitis   . Peripheral edema    takes Furosemide daily  . Shingles   . Urinary frequency   . Venous insufficiency of leg    Past Surgical History:  Procedure Laterality Date  . ABDOMINAL HYSTERECTOMY     uterine prolapse  . BACK SURGERY     fusion  . CARDIAC CATHETERIZATION  2007   NORMAL; catheter-induced spasm  . CHOLECYSTECTOMY    . COLONOSCOPY    . ESOPHAGOGASTRODUODENOSCOPY  5-52yrs ago  . FOOT SURGERY Bilateral   . SHOULDER ARTHROSCOPY WITH ROTATOR CUFF REPAIR AND SUBACROMIAL DECOMPRESSION Right 08/23/2013   Procedure: RIGHT SHOULDER ARTHROSCOPY WITH  SUBACROMIAL DECOMPRESSION DISTAL CLAVICLE RESECTION AND  ROTATOR CUFF REPAIR ;  Surgeon: Marin Shutter, MD;  Location: [facility];  Service: Orthopedics;  Laterality: Right;  . TUBAL LIGATION     Family History  Problem Relation Age of Onset  . Hypertension Mother   . Hypertension Father   . Hypertension Sister   . Kidney disease Sister     [facility] TRANSPLANT  . Thyroid disease Sister   . Thyroid disease Sister   . Hypertension Sister   . Breast cancer Maternal Aunt     >50; passed away from it  . Breast cancer Maternal Aunt     >50   Social History  Substance Use Topics  . Smoking status:  Former Smoker  . Smokeless tobacco: Never Used     Comment: quit smoking in 1992  . Alcohol use 0.0 oz/week     Comment: rarely   OB History    Gravida Para Term Preterm AB Living   2 2       2    SAB TAB Ectopic Multiple Live Births                 Review of Systems  Constitutional: Negative.  Negative for activity change, chills and fever.  HENT: Negative.   Respiratory: Negative.   Musculoskeletal: Positive for arthralgias and gait problem. Negative for myalgias.       As per HPI  Skin: Negative for color change, pallor and rash.  All other systems reviewed and are negative.   Allergies  Metronidazole; Adhesive [tape]; Latex; Penicillins; and Vesicare [solifenacin]  Home Medications   Prior to  Admission medications   Medication Sig Start Date End Date Taking? Authorizing Provider  albuterol (PROVENTIL HFA;VENTOLIN HFA) 108 (90 BASE) MCG/ACT inhaler Inhale 1-2 puffs into the lungs every 6 (six) hours as needed for wheezing or shortness of breath. 10/09/14   Croitoru, Mihai, MD  aspirin EC 81 MG tablet Take 81 mg by mouth every other day.     [provider]  cetirizine (ZYRTEC) 10 MG tablet Take 10 mg by mouth daily.    [provider]  diazepam (VALIUM) 5 MG tablet Take 0.5-1 tablets (2.5-5 mg total) by mouth every 6 (six) hours as needed for muscle spasms or sedation. 08/23/13   Shuford, Olivia Mackie, PA-C  docusate sodium (COLACE) 100 MG capsule Take 100 mg by mouth daily.     [provider]  esomeprazole (NEXIUM) 40 MG capsule Take 40 mg by mouth daily at 12 noon.    [provider]  Fish Oil OIL Take 1 capsule by mouth daily.    [provider]  fluticasone Asencion Islam) 50 MCG/ACT nasal spray  01/06/16   [provider]  furosemide (LASIX) 20 MG tablet Take 3 tablets (60 mg total) by mouth daily. 02/04/16   Croitoru, Mihai, MD  gabapentin (NEURONTIN) 300 MG capsule  01/30/16   [provider]  HYDROcodone-acetaminophen (NORCO) 10-325 MG per tablet Take 1 tablet by mouth every 6 (six) hours as needed for severe pain.    [provider]  levothyroxine (SYNTHROID, LEVOTHROID) 75 MCG tablet Take 75 mcg by mouth daily before breakfast.    [provider]  meloxicam (MOBIC) 15 MG tablet Take 15 mg by mouth daily.    [provider]  methylPREDNISolone (MEDROL DOSEPAK) 4 MG TBPK tablet 6 day dose pack - take as directed 02/03/16   Hyatt, Max T, DPM  metoprolol tartrate (LOPRESSOR) 25 MG tablet Take 25 mg by mouth 2 (two) times daily.    [provider]  mometasone (NASONEX) 50 MCG/ACT nasal spray Place 2 sprays into the nose daily as needed (allergies).    [provider]   neomycin-polymyxin-hydrocortisone (CORTISPORIN) otic solution Apply one to two drops to toe after soaking twice daily. 08/21/13   Hyatt, Max T, DPM  ondansetron (ZOFRAN) 4 MG tablet Take 1 tablet (4 mg total) by mouth every 8 (eight) hours as needed for nausea or vomiting. 08/23/13   Shuford, Olivia Mackie, PA-C  oxyCODONE-acetaminophen (PERCOCET) 5-325 MG per tablet Take 1-2 tablets by mouth every 4 (four) hours as needed. 08/23/13   Shuford, Olivia Mackie, PA-C  Pitavastatin Calcium (LIVALO) 2 MG TABS Take 0.5 tablets (  1 mg total) by mouth daily. 01/10/13   Croitoru, Mihai, MD  Polyethyl Glycol-Propyl Glycol (SYSTANE OP) Place 1 drop into both eyes daily as needed (dry eyes).     [provider]  potassium chloride SA (KLOR-CON M20) 20 MEQ tablet Take 1 tablet (20 mEq total) by mouth daily. 02/04/16 05/04/16  Croitoru, Mihai, MD  pregabalin (LYRICA) 150 MG capsule Take 1 capsule (150 mg total) by mouth 2 (two) times daily. 12/31/15   Magnus Sinning, MD  traMADol (ULTRAM) 50 MG tablet Take 1 tablet (50 mg total) by mouth every 6 (six) hours as needed. 05/29/16   Janne Napoleon, NP  valsartan (DIOVAN) 160 MG tablet Take 1 tablet (160 mg total) by mouth daily. 12/31/15   Croitoru, Mihai, MD   Meds Ordered and Administered this Visit  Medications - No data to display  BP (!) 154/75 (BP Location: Right Arm) Comment (BP Location): regular cuff  Pulse (!) 56   Temp 98.6 F (37 C) (Oral)   Resp 20   SpO2 99%  No data found.   Physical Exam  Constitutional: She is oriented to person, place, and time. She appears well-developed and well-nourished. No distress.  Neck: Neck supple.  Cardiovascular: Normal rate.   Pulmonary/Chest: Effort normal.  Musculoskeletal: She exhibits edema and tenderness. She exhibits no deformity.  Somewhat difficult and examining the right knee. This is due to a combination of body habitus and the chronic enlargement/possible swelling of the right knee. Difficult to identify landmarks.  The patella is identified and tracking inferiorly to the patella along the patella tendon produces tenderness. The patient states this is where her pain is located with weightbearing. There is lesser tenderness along the bilateral tibial condyles. No other tenderness along the joint line. Both lower extremities have chronic edema, 1+. No calf tenderness. No discoloration. Distal neurovascular motor sensory grossly intact. Patient has difficulty flexing beyond 90. She is able to extend the knee with some pain to the patella tendon.  Neurological: She is alert and oriented to person, place, and time.  Skin: Skin is warm. No erythema.  Psychiatric: She has a normal mood and affect.  Nursing note and vitals reviewed.   Urgent Care Course     Procedures (including critical care time)  Labs Review Labs Reviewed - No data to display  Imaging Review No results found.   Visual Acuity Review  Right Eye Distance:   Left Eye Distance:   Bilateral Distance:    Right Eye Near:   Left Eye Near:    Bilateral Near:         MDM   1. Acute pain of right knee   2. Patellar tendinitis of right knee    Continue applying ice about 3 times a day for 20 minutes at a time. Limit weightbearing over the next few days. When he returned to work next week limit the amount of walking that you have to do. Take the meloxicam as previously directed. Tramadol for more moderate to severe pain if needed. This can cause some drowsiness. Follow-up with your primary care doctor next week, call for appointment. Meds ordered this encounter  Medications  . traMADol (ULTRAM) 50 MG tablet    Sig: Take 1 tablet (50 mg total) by mouth every 6 (six) hours as needed.    Dispense:  15 tablet    Refill:  0    Order Specific Question:   Supervising Provider    Answer:   Sherlene Shams [[id number]]  Janne Napoleon, NP 05/29/16 1324

## 2016-05-29 NOTE — ED Triage Notes (Signed)
Right knee pain onset yesterday, woke with right knee pain.  Went to work which involves a lot of walking and right knee pain worsened

## 2016-05-29 NOTE — Discharge Instructions (Signed)
Continue applying ice about 3 times a day for 20 minutes at a time. Limit weightbearing over the next few days. When he returned to work next week limit the amount of walking that you have to do. Take the meloxicam as previously directed. Tramadol for more moderate to severe pain if needed. This can cause some drowsiness. Follow-up with your primary care doctor next week, call for appointment.

## 2016-06-01 DIAGNOSIS — M25561 Pain in right knee: Secondary | ICD-10-CM | POA: Diagnosis not present

## 2016-06-01 DIAGNOSIS — M170 Bilateral primary osteoarthritis of knee: Secondary | ICD-10-CM | POA: Diagnosis not present

## 2016-06-01 DIAGNOSIS — M25562 Pain in left knee: Secondary | ICD-10-CM | POA: Diagnosis not present

## 2016-06-02 ENCOUNTER — Other Ambulatory Visit (INDEPENDENT_AMBULATORY_CARE_PROVIDER_SITE_OTHER): Payer: Self-pay | Admitting: Physical Medicine and Rehabilitation

## 2016-06-02 DIAGNOSIS — E119 Type 2 diabetes mellitus without complications: Secondary | ICD-10-CM | POA: Diagnosis not present

## 2016-06-02 DIAGNOSIS — E038 Other specified hypothyroidism: Secondary | ICD-10-CM | POA: Diagnosis not present

## 2016-06-02 DIAGNOSIS — Z0000 Encounter for general adult medical examination without abnormal findings: Secondary | ICD-10-CM | POA: Diagnosis not present

## 2016-06-02 DIAGNOSIS — M792 Neuralgia and neuritis, unspecified: Secondary | ICD-10-CM

## 2016-06-02 NOTE — Telephone Encounter (Signed)
Please advise 

## 2016-06-09 ENCOUNTER — Other Ambulatory Visit: Payer: Self-pay | Admitting: Internal Medicine

## 2016-06-09 DIAGNOSIS — K760 Fatty (change of) liver, not elsewhere classified: Secondary | ICD-10-CM | POA: Diagnosis not present

## 2016-06-09 DIAGNOSIS — E119 Type 2 diabetes mellitus without complications: Secondary | ICD-10-CM | POA: Diagnosis not present

## 2016-06-09 DIAGNOSIS — E038 Other specified hypothyroidism: Secondary | ICD-10-CM | POA: Diagnosis not present

## 2016-06-09 DIAGNOSIS — Z1389 Encounter for screening for other disorder: Secondary | ICD-10-CM | POA: Diagnosis not present

## 2016-06-09 DIAGNOSIS — R918 Other nonspecific abnormal finding of lung field: Secondary | ICD-10-CM | POA: Diagnosis not present

## 2016-06-09 DIAGNOSIS — G4733 Obstructive sleep apnea (adult) (pediatric): Secondary | ICD-10-CM | POA: Diagnosis not present

## 2016-06-09 DIAGNOSIS — R911 Solitary pulmonary nodule: Secondary | ICD-10-CM

## 2016-06-09 DIAGNOSIS — M25562 Pain in left knee: Secondary | ICD-10-CM | POA: Diagnosis not present

## 2016-06-09 DIAGNOSIS — I10 Essential (primary) hypertension: Secondary | ICD-10-CM | POA: Diagnosis not present

## 2016-06-09 DIAGNOSIS — Z0000 Encounter for general adult medical examination without abnormal findings: Secondary | ICD-10-CM | POA: Diagnosis not present

## 2016-06-14 ENCOUNTER — Ambulatory Visit
Admission: RE | Admit: 2016-06-14 | Discharge: 2016-06-14 | Disposition: A | Discharge status: Home / Self Care | Payer: 59 | Source: Ambulatory Visit | Attending: Internal Medicine | Admitting: Internal Medicine

## 2016-06-14 DIAGNOSIS — R911 Solitary pulmonary nodule: Secondary | ICD-10-CM

## 2016-06-15 ENCOUNTER — Encounter (INDEPENDENT_AMBULATORY_CARE_PROVIDER_SITE_OTHER): Payer: Self-pay | Admitting: Orthopaedic Surgery

## 2016-06-15 ENCOUNTER — Ambulatory Visit (INDEPENDENT_AMBULATORY_CARE_PROVIDER_SITE_OTHER): Payer: 59

## 2016-06-15 ENCOUNTER — Ambulatory Visit (INDEPENDENT_AMBULATORY_CARE_PROVIDER_SITE_OTHER): Payer: 59 | Admitting: Orthopaedic Surgery

## 2016-06-15 VITALS — BP 112/74 | HR 61 | Resp 14 | Ht 63.0 in | Wt 294.0 lb

## 2016-06-15 DIAGNOSIS — G8929 Other chronic pain: Secondary | ICD-10-CM | POA: Diagnosis not present

## 2016-06-15 DIAGNOSIS — Z1212 Encounter for screening for malignant neoplasm of rectum: Secondary | ICD-10-CM | POA: Diagnosis not present

## 2016-06-15 DIAGNOSIS — M25562 Pain in left knee: Secondary | ICD-10-CM

## 2016-06-15 DIAGNOSIS — M25561 Pain in right knee: Secondary | ICD-10-CM | POA: Diagnosis not present

## 2016-06-15 MED ORDER — BUPIVACAINE HCL 0.5 % IJ SOLN
3.0000 mL | INTRAMUSCULAR | Status: AC | PRN
  Administered 2016-06-15: 3 mL via INTRA_ARTICULAR

## 2016-06-15 MED ORDER — LIDOCAINE HCL 1 % IJ SOLN
5.0000 mL | INTRAMUSCULAR | Status: AC | PRN
  Administered 2016-06-15: 5 mL

## 2016-06-15 MED ORDER — METHYLPREDNISOLONE ACETATE 40 MG/ML IJ SUSP
80.0000 mg | INTRAMUSCULAR | Status: AC | PRN
  Administered 2016-06-15: 80 mg

## 2016-06-15 NOTE — Progress Notes (Signed)
Office Visit Note   Patient: [name]           Date of Birth: [birth year]           MRN: [id number] Visit Date: 06/15/2016              Requested by: Velna Hatchet, MD [address] [place], [place] [address] PCP: Velna Hatchet, MD   Assessment & Plan: Visit Diagnoses:  1. Chronic pain of both knees   Advanced tricompartmental osteoarthritis both knees  Plan: Long discussion regarding treatment options. We'll try a cortisone injection and the more symptomatic right knee today and have her return in 2 weeks and consider cortisone injection left knee. We had the discussion regarding the specific diagnosis of osteoarthritis and what she may expect over time. I think the gastric sleeve procedure that she's scheduled to have in July and with her expected weight loss that the knee pain will significantly decrease  Orders:  Orders Placed This Encounter  Procedures  . XR KNEE 3 VIEW LEFT  . XR KNEE 3 VIEW RIGHT   No orders of the defined types were placed in this encounter.     Procedures: Large Joint Inj Date/Time: 06/15/2016 9:53 AM Performed by: Garald Balding Authorized by: Garald Balding   Consent Given by:  Patient Timeout: prior to procedure the correct patient, procedure, and site was verified   Indications:  Pain and joint swelling Location:  Knee Site:  R knee Prep: patient was prepped and draped in usual sterile fashion   Needle Size:  25 G Needle Length:  1.5 inches Approach:  Anteromedial Ultrasound Guidance: No   Fluoroscopic Guidance: No   Arthrogram: No   Medications:  5 mL lidocaine 1 %; 80 mg methylPREDNISolone acetate 40 MG/ML; 3 mL bupivacaine 0.5 % Aspiration Attempted: No   Patient tolerance:  Patient tolerated the procedure well with no immediate complications     Clinical Data: No additional findings.   Subjective: Chief Complaint  Patient presents with  . Right Knee - Pain, Edema    56 year old morbidly obese female  with a hx of arthritis to the knees presents with right knee pain that started 2 w ago.No known injury The pain is located to the anterior knee just inferior to the patella,she has chronic swelling.Uses ice  [name] was recently evaluated at [facility] for bilateral [facility] pain. She was told she had "osteoarthritis". They had suggested Visco supplementation but the cost was prohibitive. She now visits the office for another opinion. She is scheduled for a gastric sleeve procedure in July by Dr. Hassell Done. She's not had too much relief with over-the-counter medicines.  HPI  Review of Systems   Objective: Vital Signs: BP 112/74   Pulse 61   Resp 14   Ht 5\' 3"  (1.6 m)   Wt 294 lb (133.4 kg)   BMI 52.08 kg/m   Physical Exam  Ortho Exam awake alert and oriented 3. Comfortable sitting. Some pain when first getting up from a sitting position when she stiff and sore in both of her knees. BMI 52. Knees flexed 90 when calf touches thigh. Full extension. No unable to determine if there is any effusion. Predominantly medial joint pain with increased varus with weightbearing. Positive patellar crepitation. No calf pain. No distal edema. Neurovascular exam intact. No knee instability.  Specialty Comments:  No specialty comments available.  Imaging: Ct Chest Wo Contrast  Result Date: 06/14/2016 CLINICAL DATA:  Follow-up lung nodule EXAM:  CT CHEST WITHOUT CONTRAST TECHNIQUE: Multidetector CT imaging of the chest was performed following the standard protocol without IV contrast. COMPARISON:  01/15/2016, 07/09/2015 FINDINGS: Cardiovascular: Limited without intravenous contrast. Non aneurysmal aorta. Atherosclerosis. Minimal coronary artery calcification. Normal heart size. No significant effusion. Mediastinum/Nodes: Midline trachea. No significantly enlarged mediastinal lymph nodes. Esophagus within normal limits. Lungs/Pleura: No acute infiltrate or effusion. No pneumothorax. Stable 2 mm nodular  density in the right middle lobe since 2016. Upper Abdomen: Surgical clips in the gallbladder fossa. No acute abnormality. Musculoskeletal: Degenerative changes. No acute or suspicious bone lesion. IMPRESSION: 1. Stable 2 mm nodular density in the right middle lobe since 2016 compatible with benign finding. 2. There are no acute abnormalities. Electronically Signed   By: Donavan Foil M.D.   On: 06/14/2016 15:56   Xr Knee 3 View Left  Result Date: 06/15/2016 Films of the left knee repair in 3 projections standing. Findings are very similar to those done identified on the right knee. Advanced tricompartmental degenerative arthrosis with peripheral osteophytes. 3-4 of varus with significant decrease in the medial joint space associated with subchondral sclerosis. Her all of both consistent with advanced ostial arthritis in all 3 compartments  Xr Knee 3 View Right  Result Date: 06/15/2016 Films of the right knee were obtained in 3 projections standing. There are advanced degenerative changes in all 3 compartments and, particularly, the medial compartment. There are peripheral osteophytes, in  decrease the joint space and approximately 3-4 of varus. Subchondral sclerosis is identified in the medial compartment. There are large osteophytes about the medial lateral patellofemoral joint. All the above consistent with advanced tricompartmental osteoarthritis    PMFS History: Patient Active Problem List   Diagnosis Date Noted  . Prediabetes 12/03/2015  . OSA on CPAP 02/19/2015  . Morbid obesity ([place]) 10/09/2014  . Pulmonary HTN ([place]) 05/12/2013  . Bilateral lower extremity edema 05/12/2013  . Morbid obesity with BMI of 50.0-59.9, adult ([place])   . Back pain, hx of prior back surgery 12/09/2011  . Pancreatitis, mild, CT negative 12/08/2011  . Fatty liver 12/08/2011  . Substernal chest pain, suspected secondary to acute pancreatitis 12/06/2011  . Hypertension 12/06/2011  . Hypokalemia 12/06/2011  .  Hypothyroidism 12/06/2011  . GERD (gastroesophageal reflux disease) 12/06/2011  . Anxiety 12/06/2011  . BV (bacterial vaginosis)   . Hyperlipidemia    Past Medical History:  Diagnosis Date  . Anemia when she was young  . Arthritis   . Congestion of nasal sinus   . Constipation    takes Colace every other day  . DDD (degenerative disc disease), lumbar   . Diabetes mellitus without complication (HCC)    borderline  . Dry eyes    uses Systane Eye drops daily as needed  . GERD (gastroesophageal reflux disease) 12/06/2011   takes Nexium daily  . Headache(784.0)    occasionally-d/t congestion  . History of bronchitis 6-7 yrs ago  . Hyperlipidemia   . Hypertension    takes Metoprolol and Diovan daily  . Hypothyroidism 12/06/2011   takes Synthroid daily  . Joint pain   . Multiple allergies    takes Zyrtec daily;uses Nasonex daily as needed  . Nocturia   . Numbness    weakness-right hand  . OSA on CPAP   . Pancreatitis   . Peripheral edema    takes Furosemide daily  . Shingles   . Urinary frequency   . Venous insufficiency of leg     Family History  Problem Relation Age of  Onset  . Hypertension Mother   . Hypertension Father   . Hypertension Sister   . Kidney disease Sister        [facility] TRANSPLANT  . Thyroid disease Sister   . Thyroid disease Sister   . Hypertension Sister   . Breast cancer Maternal Aunt        >50; passed away from it  . Breast cancer Maternal Aunt        >50    Past Surgical History:  Procedure Laterality Date  . ABDOMINAL HYSTERECTOMY     uterine prolapse  . BACK SURGERY     fusion  . CARDIAC CATHETERIZATION  2007   NORMAL; catheter-induced spasm  . CHOLECYSTECTOMY    . COLONOSCOPY    . ESOPHAGOGASTRODUODENOSCOPY  5-31yrs ago  . FOOT SURGERY Bilateral   . SHOULDER ARTHROSCOPY WITH ROTATOR CUFF REPAIR AND SUBACROMIAL DECOMPRESSION Right 08/23/2013   Procedure: RIGHT SHOULDER ARTHROSCOPY WITH  SUBACROMIAL DECOMPRESSION DISTAL CLAVICLE  RESECTION AND  ROTATOR CUFF REPAIR ;  Surgeon: Marin Shutter, MD;  Location: [place];  Service: Orthopedics;  Laterality: Right;  . TUBAL LIGATION     Social History   Occupational History  .  [place] [employer]    [employer]   Social History Main Topics  . Smoking status: Former Research scientist (life sciences)  . Smokeless tobacco: Never Used     Comment: quit smoking in 1992  . Alcohol use 0.0 oz/week     Comment: rarely  . Drug use: No  . Sexual activity: Yes    Birth control/ protection: Surgical     Garald Balding, MD   Note - This record has been created using [employer].  Chart creation errors have been sought, but may not always  have been located. Such creation errors do not reflect on  the standard of medical care.

## 2016-06-29 ENCOUNTER — Ambulatory Visit (INDEPENDENT_AMBULATORY_CARE_PROVIDER_SITE_OTHER): Payer: 59 | Admitting: Podiatry

## 2016-06-29 ENCOUNTER — Encounter: Payer: Self-pay | Admitting: Podiatry

## 2016-06-29 DIAGNOSIS — M76821 Posterior tibial tendinitis, right leg: Secondary | ICD-10-CM

## 2016-06-30 NOTE — Progress Notes (Signed)
She presents today for follow-up of her posterior tibial tendinitis states that it still hurts the orthotics are just now more comfortable and beginning to work I can tell a difference.  Objective: Vital signs are stable alert and oriented 3. Pulses are palpable. Neurologic sensorium is intact. She has pain on palpation posterior tibial tendon of the right lower extremity particularly at the navicular tuberosity.  Assessment: Posterior tibial tendinitis.  Plan: Injected small amount of Kenalog around the tendon itself making sure not to inject into the tendon. This should help alleviate her symptoms.

## 2016-07-02 ENCOUNTER — Ambulatory Visit (INDEPENDENT_AMBULATORY_CARE_PROVIDER_SITE_OTHER): Payer: 59 | Admitting: Orthopaedic Surgery

## 2016-07-02 ENCOUNTER — Encounter (INDEPENDENT_AMBULATORY_CARE_PROVIDER_SITE_OTHER): Payer: Self-pay | Admitting: Orthopaedic Surgery

## 2016-07-02 VITALS — BP 116/70 | HR 63 | Ht 63.0 in | Wt 294.0 lb

## 2016-07-02 DIAGNOSIS — M25561 Pain in right knee: Secondary | ICD-10-CM

## 2016-07-02 DIAGNOSIS — G8929 Other chronic pain: Secondary | ICD-10-CM

## 2016-07-02 DIAGNOSIS — M25562 Pain in left knee: Secondary | ICD-10-CM | POA: Diagnosis not present

## 2016-07-02 MED ORDER — BUPIVACAINE HCL 0.5 % IJ SOLN
3.0000 mL | INTRAMUSCULAR | Status: AC | PRN
  Administered 2016-07-02: 3 mL via INTRA_ARTICULAR

## 2016-07-02 MED ORDER — LIDOCAINE HCL 1 % IJ SOLN
5.0000 mL | INTRAMUSCULAR | Status: AC | PRN
  Administered 2016-07-02: 5 mL

## 2016-07-02 MED ORDER — METHYLPREDNISOLONE ACETATE 40 MG/ML IJ SUSP
80.0000 mg | INTRAMUSCULAR | Status: AC | PRN
  Administered 2016-07-02: 80 mg

## 2016-07-02 NOTE — Progress Notes (Signed)
Office Visit Note   Patient: [name]           Date of Birth: Mar 05, 1960           MRN: [id number] Visit Date: 07/02/2016              Requested by: Velna Hatchet, MD [address] [place], [place] [address] PCP: Velna Hatchet, MD   Assessment & Plan: Visit Diagnoses:  1. Chronic pain of both knees   Bilateral knee osteoarthritis . morbid obesity  Plan: Cortisone injection left knee. Follow-up as needed  Follow-Up Instructions: Return if symptoms worsen or fail to improve.   Orders:  No orders of the defined types were placed in this encounter.  No orders of the defined types were placed in this encounter.     Procedures: Large Joint Inj Date/Time: 07/02/2016 8:41 AM Performed by: Garald Balding Authorized by: Garald Balding   Consent Given by:  Patient Timeout: prior to procedure the correct patient, procedure, and site was verified   Indications:  Pain and joint swelling Location:  Knee Site:  L knee Prep: patient was prepped and draped in usual sterile fashion   Needle Size:  25 G Needle Length:  1.5 inches Approach:  Anteromedial Ultrasound Guidance: No   Fluoroscopic Guidance: No   Arthrogram: No   Medications:  5 mL lidocaine 1 %; 80 mg methylPREDNISolone acetate 40 MG/ML; 3 mL bupivacaine 0.5 % Aspiration Attempted: No   Patient tolerance:  Patient tolerated the procedure well with no immediate complications     Clinical Data: No additional findings.   Subjective: No chief complaint on file. [name] was seen several weeks ago for evaluation of bilateral knee pain. X-rays were consistent with osteoarthritis. I injected the right knee with an excellent response. She like to have cortisone injection in the left knee. She plans on having a gastric sleeve procedure in a month  HPI  Review of Systems   Objective: Vital Signs: BP 116/70   Pulse 63   Ht 5\' 3"  (1.6 m)   Wt 294 lb (133.4 kg)   BMI 52.08 kg/m   Physical  Exam  Ortho Exam large knees . Left knee without effusion. No instability. Mild patella crepitation. Predominant medial joint pain. Range of motion 0 to about 95 when calf touches thigh. No specialty comments available.  Imaging: No results found.   PMFS History: Patient Active Problem List   Diagnosis Date Noted  . Prediabetes 12/03/2015  . OSA on CPAP 02/19/2015  . Morbid obesity ([place]) 10/09/2014  . Pulmonary HTN ([place]) 05/12/2013  . Bilateral lower extremity edema 05/12/2013  . Morbid obesity with BMI of 50.0-59.9, adult ([place])   . Back pain, hx of prior back surgery 12/09/2011  . Pancreatitis, mild, CT negative 12/08/2011  . Fatty liver 12/08/2011  . Substernal chest pain, suspected secondary to acute pancreatitis 12/06/2011  . Hypertension 12/06/2011  . Hypokalemia 12/06/2011  . Hypothyroidism 12/06/2011  . GERD (gastroesophageal reflux disease) 12/06/2011  . Anxiety 12/06/2011  . BV (bacterial vaginosis)   . Hyperlipidemia    Past Medical History:  Diagnosis Date  . Anemia when she was young  . Arthritis   . Congestion of nasal sinus   . Constipation    takes Colace every other day  . DDD (degenerative disc disease), lumbar   . Diabetes mellitus without complication (HCC)    borderline  . Dry eyes    uses Systane Eye drops daily as needed  .  GERD (gastroesophageal reflux disease) 12/06/2011   takes Nexium daily  . Headache(784.0)    occasionally-d/t congestion  . History of bronchitis 6-7 yrs ago  . Hyperlipidemia   . Hypertension    takes Metoprolol and Diovan daily  . Hypothyroidism 12/06/2011   takes Synthroid daily  . Joint pain   . Multiple allergies    takes Zyrtec daily;uses Nasonex daily as needed  . Nocturia   . Numbness    weakness-right hand  . OSA on CPAP   . Pancreatitis   . Peripheral edema    takes Furosemide daily  . Shingles   . Urinary frequency   . Venous insufficiency of leg     Family History  Problem Relation Age of Onset    . Hypertension Mother   . Hypertension Father   . Hypertension Sister   . Kidney disease Sister        [facility] TRANSPLANT  . Thyroid disease Sister   . Thyroid disease Sister   . Hypertension Sister   . Breast cancer Maternal Aunt        >50; passed away from it  . Breast cancer Maternal Aunt        >50    Past Surgical History:  Procedure Laterality Date  . ABDOMINAL HYSTERECTOMY     uterine prolapse  . BACK SURGERY     fusion  . CARDIAC CATHETERIZATION  2007   NORMAL; catheter-induced spasm  . CHOLECYSTECTOMY    . COLONOSCOPY    . ESOPHAGOGASTRODUODENOSCOPY  5-22yrs ago  . FOOT SURGERY Bilateral   . SHOULDER ARTHROSCOPY WITH ROTATOR CUFF REPAIR AND SUBACROMIAL DECOMPRESSION Right 08/23/2013   Procedure: RIGHT SHOULDER ARTHROSCOPY WITH  SUBACROMIAL DECOMPRESSION DISTAL CLAVICLE RESECTION AND  ROTATOR CUFF REPAIR ;  Surgeon: Marin Shutter, MD;  Location: [facility];  Service: Orthopedics;  Laterality: Right;  . TUBAL LIGATION     Social History   Occupational History  .  [employer]    [employer]   Social History Main Topics  . Smoking status: Former Research scientist (life sciences)  . Smokeless tobacco: Never Used     Comment: quit smoking in 1992  . Alcohol use 0.0 oz/week     Comment: rarely  . Drug use: No  . Sexual activity: Yes    Birth control/ protection: Surgical     Garald Balding, MD   Note - This record has been created using [employer].  Chart creation errors have been sought, but may not always  have been located. Such creation errors do not reflect on  the standard of medical care.

## 2016-07-06 DIAGNOSIS — R339 Retention of urine, unspecified: Secondary | ICD-10-CM | POA: Diagnosis not present

## 2016-07-06 DIAGNOSIS — G4733 Obstructive sleep apnea (adult) (pediatric): Secondary | ICD-10-CM | POA: Diagnosis not present

## 2016-07-21 ENCOUNTER — Telehealth (INDEPENDENT_AMBULATORY_CARE_PROVIDER_SITE_OTHER): Payer: Self-pay | Admitting: Orthopaedic Surgery

## 2016-07-21 NOTE — Telephone Encounter (Signed)
Patient called wanting to know if Dr. Durward Fortes will prescribe her a handicap plaque.  She also wants to know if she needs to come in and get another injection.  The knee Dr. Durward Fortes had injected at her previous appointment is hurting again.  CB#[phone].  Thank you.

## 2016-07-21 NOTE — Telephone Encounter (Signed)
Please advise 

## 2016-07-22 NOTE — Telephone Encounter (Signed)
Too early for injection, at least 3 months between injections.  I filled the handicap plackardfor her and on your desk

## 2016-07-23 ENCOUNTER — Telehealth: Payer: Self-pay | Admitting: Radiology

## 2016-07-23 NOTE — Telephone Encounter (Signed)
The application has already been started by Marcie Bal, is on your desk for signature, please sign let patient know when ready for pick up / she came in, but has been advised it is  Not signed yet.

## 2016-07-23 NOTE — Telephone Encounter (Signed)
CALLED AND TOLD TOO EARLY ..., SHE WILL PICK UP APPLICATION FOR DMV FROM OFFICE

## 2016-07-23 NOTE — Telephone Encounter (Signed)
error 

## 2016-07-23 NOTE — Telephone Encounter (Signed)
Found application, it was signed, and gave to patient, disregard message

## 2016-07-24 NOTE — Telephone Encounter (Signed)
thanks

## 2016-08-04 ENCOUNTER — Ambulatory Visit (INDEPENDENT_AMBULATORY_CARE_PROVIDER_SITE_OTHER): Payer: 59 | Admitting: Psychiatry

## 2016-08-04 DIAGNOSIS — F509 Eating disorder, unspecified: Secondary | ICD-10-CM

## 2016-08-09 ENCOUNTER — Ambulatory Visit: Payer: Self-pay | Admitting: Registered"

## 2016-08-10 ENCOUNTER — Ambulatory Visit: Payer: Self-pay | Admitting: Skilled Nursing Facility1

## 2016-08-13 NOTE — Progress Notes (Signed)
Please place orders in EPIC as patient is being scheduled for a pre-op appointment! Thank you! 

## 2016-08-14 ENCOUNTER — Ambulatory Visit: Payer: Self-pay | Admitting: Surgery

## 2016-08-16 ENCOUNTER — Ambulatory Visit: Payer: Self-pay

## 2016-08-16 ENCOUNTER — Encounter: Payer: 59 | Attending: Surgery | Admitting: Skilled Nursing Facility1

## 2016-08-16 DIAGNOSIS — Z713 Dietary counseling and surveillance: Secondary | ICD-10-CM | POA: Diagnosis not present

## 2016-08-16 DIAGNOSIS — Z6841 Body Mass Index (BMI) 40.0 and over, adult: Secondary | ICD-10-CM | POA: Insufficient documentation

## 2016-08-16 DIAGNOSIS — E119 Type 2 diabetes mellitus without complications: Secondary | ICD-10-CM

## 2016-08-17 ENCOUNTER — Encounter: Payer: Self-pay | Admitting: Skilled Nursing Facility1

## 2016-08-17 ENCOUNTER — Ambulatory Visit: Payer: 59 | Admitting: Neurology

## 2016-08-17 NOTE — Patient Instructions (Addendum)
[name]  08/17/2016   Your procedure is scheduled on: 08-23-16  Report to [place]  elevators to 3rd floor to  [place] at [phone].   Call this number if you have problems the morning of surgery [phone]    Remember: ONLY 1 PERSON MAY GO WITH YOU TO SHORT STAY TO GET  READY MORNING OF [place].  Do not eat food or drink liquids :After Midnight.    PLEASE BRING CPAP MASK AND TUBING. DEVICE WILL BE PROVIDED FOR YOU!   Take these medicines the morning of surgery with A SIP OF WATER: METOPROLOL, SYNTHROID, ESOMEPRAZOLE(NEXIUM), LORATADINE(CLARITIN), NASAL SPRAY AND INHALER AS NEEDED ([place])  DO NOT TAKE ANY DIABETIC MEDICATIONS DAY OF YOUR SURGERY                               You may not have any metal on your body including hair pins and              piercings  Do not wear jewelry, make-up, lotions, powders or perfumes, deodorant             Do not wear nail polish.  Do not shave  48 hours prior to surgery.          Do not bring valuables to the hospital. [place].  Contacts, dentures or bridgework may not be worn into surgery.  Leave suitcase in the car. After surgery it may be brought to your room.               Please read over the following fact sheets you were given: _____________________________________________________________________          [facility] - Preparing for Surgery Before surgery, you can play an important role.  Because skin is not sterile, your skin needs to be as free of germs as possible.  You can reduce the number of germs on your skin by washing with CHG (chlorahexidine gluconate) soap before surgery.  CHG is an antiseptic cleaner which kills germs and bonds with the skin to continue killing germs even after washing. Please DO NOT use if you have an allergy to CHG or antibacterial soaps.  If your skin becomes  reddened/irritated stop using the CHG and inform your nurse when you arrive at Short Stay. Do not shave (including legs and underarms) for at least 48 hours prior to the first CHG shower.  You may shave your face/neck. Please follow these instructions carefully:  1.  Shower with CHG Soap the night before surgery and the  morning of Surgery.  2.  If you choose to wash your hair, wash your hair first as usual with your  normal  shampoo.  3.  After you shampoo, rinse your hair and body thoroughly to remove the  shampoo.                           4.  Use CHG as you would any other liquid soap.  You can apply chg directly  to the skin and wash  Gently with a scrungie or clean washcloth.  5.  Apply the CHG Soap to your body ONLY FROM THE NECK DOWN.   Do not use on face/ open                           Wound or open sores. Avoid contact with eyes, ears mouth and genitals (private parts).                       Wash face,  Genitals (private parts) with your normal soap.             6.  Wash thoroughly, paying special attention to the area where your surgery  will be performed.  7.  Thoroughly rinse your body with warm water from the neck down.  8.  DO NOT shower/wash with your normal soap after using and rinsing off  the CHG Soap.                9.  Pat yourself dry with a clean towel.            10.  Wear clean pajamas.            11.  Place clean sheets on your bed the night of your first shower and do not  sleep with pets. Day of Surgery : Do not apply any lotions/deodorants the morning of surgery.  Please wear clean clothes to the hospital/surgery center.  FAILURE TO FOLLOW THESE INSTRUCTIONS MAY RESULT IN THE CANCELLATION OF YOUR SURGERY PATIENT SIGNATURE_________________________________  NURSE SIGNATURE__________________________________  ________________________________________________________________________   Adam Phenix  An incentive spirometer is a tool that  can help keep your lungs clear and active. This tool measures how well you are filling your lungs with each breath. Taking long deep breaths may help reverse or decrease the chance of developing breathing (pulmonary) problems (especially infection) following:  A long period of time when you are unable to move or be active. BEFORE THE PROCEDURE   If the spirometer includes an indicator to show your best effort, your nurse or respiratory therapist will set it to a desired goal.  If possible, sit up straight or lean slightly forward. Try not to slouch.  Hold the incentive spirometer in an upright position. INSTRUCTIONS FOR USE  1. Sit on the edge of your bed if possible, or sit up as far as you can in bed or on a chair. 2. Hold the incentive spirometer in an upright position. 3. Breathe out normally. 4. Place the mouthpiece in your mouth and seal your lips tightly around it. 5. Breathe in slowly and as deeply as possible, raising the piston or the ball toward the top of the column. 6. Hold your breath for 3-5 seconds or for as long as possible. Allow the piston or ball to fall to the bottom of the column. 7. Remove the mouthpiece from your mouth and breathe out normally. 8. Rest for a few seconds and repeat Steps 1 through 7 at least 10 times every 1-2 hours when you are awake. Take your time and take a few normal breaths between deep breaths. 9. The spirometer may include an indicator to show your best effort. Use the indicator as a goal to work toward during each repetition. 10. After each set of 10 deep breaths, practice coughing to be sure your lungs are clear. If you have an incision (the cut made at the time of surgery),  support your incision when coughing by placing a pillow or rolled up towels firmly against it. Once you are able to get out of bed, walk around indoors and cough well. You may stop using the incentive spirometer when instructed by your caregiver.  RISKS AND  COMPLICATIONS  Take your time so you do not get dizzy or light-headed.  If you are in pain, you may need to take or ask for pain medication before doing incentive spirometry. It is harder to take a deep breath if you are having pain. AFTER USE  Rest and breathe slowly and easily.  It can be helpful to keep track of a log of your progress. Your caregiver can provide you with a simple table to help with this. If you are using the spirometer at home, follow these instructions: [place] IF:   You are having difficultly using the spirometer.  You have trouble using the spirometer as often as instructed.  Your pain medication is not giving enough relief while using the spirometer.  You develop fever of 100.5 F (38.1 C) or higher. SEEK IMMEDIATE MEDICAL CARE IF:   You cough up bloody sputum that had not been present before.  You develop fever of 102 F (38.9 C) or greater.  You develop worsening pain at or near the incision site. MAKE SURE YOU:   Understand these instructions.  Will watch your condition.  Will get help right away if you are not doing well or get worse. Document Released: 05/24/2006 Document Revised: 04/05/2011 Document Reviewed: 07/25/2006 [facility] Patient Information 2014 [place], [place].   ________________________________________________________________________

## 2016-08-17 NOTE — Progress Notes (Signed)
Pre-Operative Nutrition Class:  Appt start time: 6168   End time:  1830.  Patient was seen on 08/16/2016 for Pre-Operative Bariatric Surgery [facility]   Surgery date: 07/30 Surgery type: Sleeve Gastrectomy  [facility] weight at [facility]: 287 Weight today: 309  Samples given per MNT protocol. Patient educated on appropriate usage: Bariatric Advantage Calcium  Lot # [id number] Exp: dec-08-2016  Celebrate Vitamins Multivitamin Lot # [id number] Exp: 06/2017  Renee Pain Protein Shake Lot # [id number] Exp: nov-02-2016  The following the learning objectives were met by the patient during this course:  Identify Pre-Op Dietary Goals and will begin 2 weeks pre-operatively  Identify appropriate sources of fluids and proteins   State protein recommendations and appropriate sources pre and post-operatively  Identify Post-Operative Dietary Goals and will follow for 2 weeks post-operatively  Identify appropriate multivitamin and calcium sources  Describe the need for physical activity post-operatively and will follow MD recommendations  State when to call healthcare provider regarding medication questions or post-operative complications  Handouts given during class include:  Pre-Op Bariatric Surgery Diet Handout  Protein Shake Handout  Post-Op Bariatric Surgery Nutrition Handout  BELT Program Information Flyer  Support Group Information Flyer  [facility] Bariatric Supplements Price List  Follow-Up Plan: [facility] for diet advancement per MD.

## 2016-08-17 NOTE — Progress Notes (Addendum)
LOV CARDIOLOGY DR Sallyanne Kuster 2016 EPIC ; PER MD NOTE "She underwent coronary angiography in 2007 and did not have evidence of coronary artery disease. 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 In 2013 she was hospitalized with chest pain, apparently related to drug-induced pancreatitis (metronidazole). In 2015 she underwent an echocardiogram that is essentially normal except for mild diastolic dysfunction"  ekg 02-23-16 EPIC  CT CHEST 06-14-16 EPIC

## 2016-08-18 ENCOUNTER — Encounter (HOSPITAL_COMMUNITY): Payer: Self-pay

## 2016-08-18 ENCOUNTER — Telehealth (INDEPENDENT_AMBULATORY_CARE_PROVIDER_SITE_OTHER): Payer: Self-pay

## 2016-08-18 ENCOUNTER — Encounter (HOSPITAL_COMMUNITY)
Admission: RE | Admit: 2016-08-18 | Discharge: 2016-08-18 | Disposition: A | Discharge status: Home / Self Care | Payer: 59 | Source: Ambulatory Visit | Attending: Surgery | Admitting: Surgery

## 2016-08-18 ENCOUNTER — Other Ambulatory Visit: Payer: Self-pay | Admitting: Pharmacist Clinician (PhC)/ Clinical Pharmacy Specialist

## 2016-08-18 DIAGNOSIS — Z01818 Encounter for other preprocedural examination: Secondary | ICD-10-CM | POA: Insufficient documentation

## 2016-08-18 LAB — COMPREHENSIVE METABOLIC PANEL
ALBUMIN: 4.1 g/dL (ref 3.5–5.0)
ALT: 23 U/L (ref 14–54)
ANION GAP: 10 (ref 5–15)
AST: 32 U/L (ref 15–41)
Alkaline Phosphatase: 94 U/L (ref 38–126)
BUN: 15 mg/dL (ref 6–20)
CHLORIDE: 104 mmol/L (ref 101–111)
CO2: 25 mmol/L (ref 22–32)
Calcium: 9.6 mg/dL (ref 8.9–10.3)
Creatinine, Ser: 0.72 mg/dL (ref 0.44–1.00)
GFR calc non Af Amer: >60 mL/min (ref >60)
GLUCOSE: 93 mg/dL (ref 65–99)
Potassium: 3.9 mmol/L (ref 3.5–5.1)
SODIUM: 139 mmol/L (ref 135–145)
Total Bilirubin: 0.5 mg/dL (ref 0.3–1.2)
Total Protein: 8.2 g/dL — ABNORMAL HIGH (ref 6.5–8.1)

## 2016-08-18 LAB — CBC WITH DIFFERENTIAL/PLATELET
BASOS PCT: 1 %
Basophils Absolute: 0.1 10*3/uL (ref 0.0–0.1)
EOS ABS: 0.2 10*3/uL (ref 0.0–0.7)
EOS PCT: 4 %
HCT: 40.1 % (ref 36.0–46.0)
Hemoglobin: 13.2 g/dL (ref 12.0–15.0)
LYMPHS ABS: 2.6 10*3/uL (ref 0.7–4.0)
Lymphocytes Relative: 41 %
MCH: 27.2 pg (ref 26.0–34.0)
MCHC: 32.9 g/dL (ref 30.0–36.0)
MCV: 82.5 fL (ref 78.0–100.0)
Monocytes Absolute: 0.6 10*3/uL (ref 0.1–1.0)
Monocytes Relative: 10 %
NEUTROS PCT: 44 %
Neutro Abs: 2.9 10*3/uL (ref 1.7–7.7)
PLATELETS: 310 10*3/uL (ref 150–400)
RBC: 4.86 MIL/uL (ref 3.87–5.11)
RDW: 14.7 % (ref 11.5–15.5)
WBC: 6.4 10*3/uL (ref 4.0–10.5)

## 2016-08-18 MED ORDER — POTASSIUM CHLORIDE 20 MEQ/15ML (10%) PO SOLN: 20.0000 meq | Freq: Every day | ORAL | 0 refills | Status: DC

## 2016-08-18 NOTE — Telephone Encounter (Signed)
Evidently Lyrica makes a 20mg /ml solution but I have not precried. Her GI doctors can prescribe and just make dosing the same [facility]

## 2016-08-18 NOTE — Telephone Encounter (Signed)
Pt left vm stating she is scheduled for gastric sleeve procedure and they are taking her off NSAIDS. Wants to know if there is something else besides the meloxicam you can recommend her take?

## 2016-08-18 NOTE — Telephone Encounter (Signed)
Switched potassium to liquid formulation due to upcoming gastric surgery.

## 2016-08-18 NOTE — Telephone Encounter (Signed)
Tylenol ex strength three times per day is safest thing and then they will help pain around surgery I am sure.

## 2016-08-19 ENCOUNTER — Telehealth: Payer: Self-pay

## 2016-08-19 MED ORDER — IRBESARTAN 150 MG PO TABS: 150.0000 mg | ORAL_TABLET | Freq: Every day | ORAL | 3 refills | Status: DC

## 2016-08-19 NOTE — Telephone Encounter (Signed)
Called pt and advised.  

## 2016-08-19 NOTE — Telephone Encounter (Signed)
-----   Message from Sanda Klein, MD sent at 08/18/2016  8:26 AM EDT ----- Please switch diovan to irbesartan 150 mg once daily.  OK to crush metoprolol and irbesartan. Neither one is routinely available in a liquid form.  Please ask her if she is taking furosemide and KCl daily or PRN only. Both furosemide and KCl are available in liquid form. MCr

## 2016-08-20 ENCOUNTER — Other Ambulatory Visit: Payer: Self-pay | Admitting: Pharmacist Clinician (PhC)/ Clinical Pharmacy Specialist

## 2016-08-20 MED ORDER — FUROSEMIDE 10 MG/ML PO SOLN: 60.0000 mg | Freq: Every day | ORAL | 12 refills | Status: DC

## 2016-08-22 NOTE — H&P (Signed)
[name] Patient #: [id number] DOB: May 04, 1960 Single / Language: Cleophus Molt / Race: Black or African American Female   History of Present Illness  The patient is a 56 year old female who presents for a bariatric surgery evaluation. Symptoms include excess weight, inability to lose weight, edema and joint pain. Initial onset of obesity was in childhood. Reported interest in weight loss is high. She works for [employer] over at [facility] [employer]. She has had lifelong obesity, has tried numerous weight loss attempts with varying degrees of success but ultimately with weight regain. She has had a lap chole. She has OSA and is on CPAP. Today's weight is 293 with a BMI of 50.4. Questionable GERD.   I explained sleeve gastrectomy which she is interested in . She has arthritis and has to take NSAIDS. I discussed the technical aspects of the operation including complications not limited to sleeve leaks and bleeds. She is recently approved and is slated for sleeve gastrectomy. Informed consent completed.  Past Surgical History  Foot Surgery  Bilateral. Gallbladder Surgery - Open  Hysterectomy (due to cancer) - Partial  Hysterectomy (not due to cancer) - Partial  Shoulder Surgery  Bilateral.  Diagnostic Studies History Colonoscopy  5-10 years ago Mammogram  >3 years ago  Allergies  Metronidazole (Topical) *DERMATOLOGICALS*  Hives. Adhesive Tape 2"x10yd *MEDICAL DEVICES AND SUPPLIES*  Rash. Latex Exam Gloves *MEDICAL DEVICES AND SUPPLIES*  Rash. PenicillAMINE *MISCELLANEOUS THERAPEUTIC CLASSES*  Rash. VESIcare *URINARY ANTISPASMODICS*  Rash.  Medication History  Ventolin HFA (108 (90 Base)MCG/ACT Aerosol Soln, Inhalation as needed) Active. Aspirin (81MG  Tablet, Oral daily) Active. ZyrTEC Allergy (10MG  Tablet, Oral daily) Active. Colace (100MG  Capsule, Oral daily) Active. NexIUM (20MG  Capsule DR, Oral daily) Active. Lasix (20MG  Tablet, Oral daily)  Active. LevoFLOXacin (500MG  Tablet, Oral daily) Active. Mobic (15MG  Tablet, Oral daily) Active. Lopressor HCT (50-25MG  Tablet, Oral daily) Active. Nasonex (50MCG/ACT Suspension, Nasal as needed) Active. Cortisporin (3.5-10000-1 Solution, Otic as needed) Active. Zofran (4MG  Tablet, Oral as needed) Active. Levothyroxine Sodium (75MCG Tablet, Oral daily) Active. Valsartan (160MG  Tablet, Oral daily) Active. Benzonatate (100MG  Capsule, Oral daily) Active. Azithromycin (250MG  Tablet, Oral daily) Active. Lyrica (150MG  Capsule, Oral daily) Active. Fluticasone Propionate (50MCG/ACT Suspension, Nasal daily) Active. Medications Reconciled  Social History  Alcohol use  Occasional alcohol use. Caffeine use  Coffee. Illicit drug use  Uses socially only. Tobacco use  Former smoker.  Family History Alcohol Abuse  Brother, Father, Sister. Breast Cancer  Family Members In General. Hypertension  Sister. Kidney Disease  Sister.  Pregnancy / Birth History  Age at menarche  34 years. Age of menopause  36-55 Gravida  2 Maternal age  52-20 Para  2  Other Problems  Arthritis  Back Pain  Cholelithiasis  Diabetes Mellitus  Gastroesophageal Reflux Disease  High blood pressure  Hypercholesterolemia  Pancreatitis  Sleep Apnea  Thyroid Disease     Review of Systems  General Present- Night Sweats and Weight Gain. Not Present- Appetite Loss, Chills, Fatigue, Fever and Weight Loss. Skin Not Present- Change in Wart/Mole, Dryness, Hives, Jaundice, New Lesions, Non-Healing Wounds, Rash and Ulcer. HEENT Present- Hoarseness, Seasonal Allergies and Wears glasses/contact lenses. Not Present- Earache, Hearing Loss, Nose Bleed, Oral Ulcers, Ringing in the Ears, Sinus Pain, Sore Throat, Visual Disturbances and Yellow Eyes. Respiratory Present- Snoring and Wheezing. Not Present- Bloody sputum, Chronic Cough and Difficulty Breathing. Cardiovascular Present- Leg Cramps and  Swelling of Extremities. Not Present- Chest Pain, Difficulty Breathing Lying Down, Palpitations, Rapid Heart Rate and Shortness of  Breath. Gastrointestinal Present- Bloating, Constipation and Indigestion. Not Present- Abdominal Pain, Bloody Stool, Change in Bowel Habits, Chronic diarrhea, Difficulty Swallowing, Excessive gas, Gets full quickly at meals, Hemorrhoids, Nausea, Rectal Pain and Vomiting. Musculoskeletal Present- Back Pain and Joint Stiffness. Not Present- Joint Pain, Muscle Pain, Muscle Weakness and Swelling of Extremities. Neurological Not Present- Decreased Memory, Fainting, Headaches, Numbness, Seizures, Tingling, Tremor, Trouble walking and Weakness. Endocrine Present- Hair Changes, Hot flashes and New Diabetes. Not Present- Cold Intolerance, Excessive Hunger and Heat Intolerance. Hematology Not Present- Blood Thinners, Easy Bruising, Excessive bleeding, Gland problems, HIV and Persistent Infections.  Vitals  Weight: 293.6 lb Height: 64in Body Surface Area: 2.3 m Body Mass Index: 50.4 kg/m  Temp.: 97.2F  Pulse: 72 (Regular)  BP: 126/82 (Sitting, Left Arm, Standard)       Physical Exam  General Note: HEENT unremarkable Neck supple without bruits Chest clear (had bronchitis last week) Heart SR without murmurs Abdomen obese and nontender Ext some chronic edema but no history of DVT Neuro alert and oriented x3     Assessment & Plan  MORBID OBESITY, UNSPECIFIED OBESITY TYPE (E66.01) Impression: BMI 50. For sleeve gastrectomy.  Matt B. Hassell Done, MD, FACS

## 2016-08-23 ENCOUNTER — Encounter (HOSPITAL_COMMUNITY): Payer: Self-pay | Admitting: *Deleted

## 2016-08-23 ENCOUNTER — Inpatient Hospital Stay (HOSPITAL_COMMUNITY)
Admission: RE | Admit: 2016-08-23 | Discharge: 2016-08-25 | DRG: 621 | Disposition: A | Discharge status: Home / Self Care | Payer: 59 | Source: Ambulatory Visit | Attending: Surgery | Admitting: Surgery

## 2016-08-23 ENCOUNTER — Inpatient Hospital Stay (HOSPITAL_COMMUNITY): Payer: 59 | Admitting: Anesthesiology

## 2016-08-23 ENCOUNTER — Encounter (HOSPITAL_COMMUNITY)
Admission: RE | Disposition: A | Discharge status: Home / Self Care | Payer: Self-pay | Source: Ambulatory Visit | Attending: Surgery

## 2016-08-23 DIAGNOSIS — I10 Essential (primary) hypertension: Secondary | ICD-10-CM | POA: Diagnosis not present

## 2016-08-23 DIAGNOSIS — M1990 Unspecified osteoarthritis, unspecified site: Secondary | ICD-10-CM | POA: Diagnosis present

## 2016-08-23 DIAGNOSIS — Z9989 Dependence on other enabling machines and devices: Secondary | ICD-10-CM

## 2016-08-23 DIAGNOSIS — Z91040 Latex allergy status: Secondary | ICD-10-CM | POA: Diagnosis not present

## 2016-08-23 DIAGNOSIS — Z880 Allergy status to penicillin: Secondary | ICD-10-CM

## 2016-08-23 DIAGNOSIS — E119 Type 2 diabetes mellitus without complications: Secondary | ICD-10-CM | POA: Diagnosis present

## 2016-08-23 DIAGNOSIS — Z7982 Long term (current) use of aspirin: Secondary | ICD-10-CM | POA: Diagnosis not present

## 2016-08-23 DIAGNOSIS — G4733 Obstructive sleep apnea (adult) (pediatric): Secondary | ICD-10-CM | POA: Diagnosis present

## 2016-08-23 DIAGNOSIS — R7303 Prediabetes: Secondary | ICD-10-CM

## 2016-08-23 DIAGNOSIS — K2950 Unspecified chronic gastritis without bleeding: Secondary | ICD-10-CM | POA: Diagnosis not present

## 2016-08-23 DIAGNOSIS — Z791 Long term (current) use of non-steroidal anti-inflammatories (NSAID): Secondary | ICD-10-CM | POA: Diagnosis not present

## 2016-08-23 DIAGNOSIS — Z79899 Other long term (current) drug therapy: Secondary | ICD-10-CM

## 2016-08-23 DIAGNOSIS — Z87891 Personal history of nicotine dependence: Secondary | ICD-10-CM | POA: Diagnosis not present

## 2016-08-23 DIAGNOSIS — Z6841 Body Mass Index (BMI) 40.0 and over, adult: Secondary | ICD-10-CM | POA: Diagnosis not present

## 2016-08-23 DIAGNOSIS — Z833 Family history of diabetes mellitus: Secondary | ICD-10-CM | POA: Diagnosis not present

## 2016-08-23 DIAGNOSIS — E7800 Pure hypercholesterolemia, unspecified: Secondary | ICD-10-CM | POA: Diagnosis present

## 2016-08-23 DIAGNOSIS — Z888 Allergy status to other drugs, medicaments and biological substances status: Secondary | ICD-10-CM | POA: Diagnosis not present

## 2016-08-23 DIAGNOSIS — E785 Hyperlipidemia, unspecified: Secondary | ICD-10-CM | POA: Diagnosis not present

## 2016-08-23 DIAGNOSIS — Z9884 Bariatric surgery status: Secondary | ICD-10-CM

## 2016-08-23 DIAGNOSIS — E039 Hypothyroidism, unspecified: Secondary | ICD-10-CM | POA: Diagnosis not present

## 2016-08-23 HISTORY — PX: LAPAROSCOPIC GASTRIC SLEEVE RESECTION: SHX5895

## 2016-08-23 LAB — CBC
HEMATOCRIT: 37.3 % (ref 36.0–46.0)
Hemoglobin: 12.2 g/dL (ref 12.0–15.0)
MCH: 27.1 pg (ref 26.0–34.0)
MCHC: 32.7 g/dL (ref 30.0–36.0)
MCV: 82.7 fL (ref 78.0–100.0)
PLATELETS: 276 10*3/uL (ref 150–400)
RBC: 4.51 MIL/uL (ref 3.87–5.11)
RDW: 15 % (ref 11.5–15.5)
WBC: 8.8 10*3/uL (ref 4.0–10.5)

## 2016-08-23 LAB — CREATININE, SERUM
Creatinine, Ser: 0.64 mg/dL (ref 0.44–1.00)
GFR calc non Af Amer: >60 mL/min (ref >60)

## 2016-08-23 LAB — HEMOGLOBIN AND HEMATOCRIT, BLOOD
HCT: 37.7 % (ref 36.0–46.0)
Hemoglobin: 12.3 g/dL (ref 12.0–15.0)

## 2016-08-23 LAB — GLUCOSE, CAPILLARY: GLUCOSE-CAPILLARY: 118 mg/dL — AB (ref 65–99)

## 2016-08-23 SURGERY — GASTRECTOMY, SLEEVE, LAPAROSCOPIC
Anesthesia: General | Site: Abdomen

## 2016-08-23 MED ORDER — MIDAZOLAM HCL 5 MG/5ML IJ SOLN
INTRAMUSCULAR | Status: DC | PRN
  Administered 2016-08-23: 2 mg via INTRAVENOUS

## 2016-08-23 MED ORDER — LACTATED RINGERS IV SOLN
INTRAVENOUS | Status: DC
  Administered 2016-08-23 (×3): via INTRAVENOUS

## 2016-08-23 MED ORDER — ROCURONIUM BROMIDE 50 MG/5ML IV SOSY
PREFILLED_SYRINGE | INTRAVENOUS | Status: AC
  Filled 2016-08-23: qty 5

## 2016-08-23 MED ORDER — EPHEDRINE 5 MG/ML INJ
INTRAVENOUS | Status: AC
  Filled 2016-08-23: qty 10

## 2016-08-23 MED ORDER — FENTANYL CITRATE (PF) 100 MCG/2ML IJ SOLN
INTRAMUSCULAR | Status: AC
  Filled 2016-08-23: qty 2

## 2016-08-23 MED ORDER — OXYCODONE HCL 5 MG/5ML PO SOLN
5.0000 mg | ORAL | Status: DC | PRN
  Administered 2016-08-23 – 2016-08-24 (×4): 5 mg via ORAL
  Administered 2016-08-24: 10 mg via ORAL
  Administered 2016-08-25: 5 mg via ORAL
  Filled 2016-08-23 (×4): qty 5
  Filled 2016-08-23: qty 10
  Filled 2016-08-23: qty 5

## 2016-08-23 MED ORDER — HEPARIN SODIUM (PORCINE) 5000 UNIT/ML IJ SOLN
5000.0000 [IU] | INTRAMUSCULAR | Status: AC
  Administered 2016-08-23: 5000 [IU] via SUBCUTANEOUS
  Filled 2016-08-23: qty 1

## 2016-08-23 MED ORDER — DEXAMETHASONE SODIUM PHOSPHATE 10 MG/ML IJ SOLN
INTRAMUSCULAR | Status: DC | PRN
  Administered 2016-08-23: 10 mg via INTRAVENOUS

## 2016-08-23 MED ORDER — CELECOXIB 200 MG PO CAPS
400.0000 mg | ORAL_CAPSULE | ORAL | Status: AC
  Administered 2016-08-23: 400 mg via ORAL
  Filled 2016-08-23: qty 2

## 2016-08-23 MED ORDER — FENTANYL CITRATE (PF) 100 MCG/2ML IJ SOLN
INTRAMUSCULAR | Status: DC | PRN
  Administered 2016-08-23: 25 ug via INTRAVENOUS
  Administered 2016-08-23 (×3): 50 ug via INTRAVENOUS
  Administered 2016-08-23: 25 ug via INTRAVENOUS

## 2016-08-23 MED ORDER — ONDANSETRON HCL 4 MG/2ML IJ SOLN
4.0000 mg | INTRAMUSCULAR | Status: DC | PRN
  Administered 2016-08-23: 4 mg via INTRAVENOUS
  Filled 2016-08-23: qty 2

## 2016-08-23 MED ORDER — SUGAMMADEX SODIUM 500 MG/5ML IV SOLN
INTRAVENOUS | Status: AC
  Filled 2016-08-23: qty 5

## 2016-08-23 MED ORDER — BUPIVACAINE LIPOSOME 1.3 % IJ SUSP
20.0000 mL | Freq: Once | INTRAMUSCULAR | Status: AC
  Administered 2016-08-23: 20 mL
  Filled 2016-08-23: qty 20

## 2016-08-23 MED ORDER — LIDOCAINE 2% (20 MG/ML) 5 ML SYRINGE
INTRAMUSCULAR | Status: AC
  Filled 2016-08-23: qty 5

## 2016-08-23 MED ORDER — HEPARIN SODIUM (PORCINE) 5000 UNIT/ML IJ SOLN
5000.0000 [IU] | Freq: Three times a day (TID) | INTRAMUSCULAR | Status: DC
  Administered 2016-08-23 – 2016-08-25 (×5): 5000 [IU] via SUBCUTANEOUS
  Filled 2016-08-23 (×5): qty 1

## 2016-08-23 MED ORDER — FENTANYL CITRATE (PF) 100 MCG/2ML IJ SOLN
25.0000 ug | INTRAMUSCULAR | Status: DC | PRN
  Administered 2016-08-23 (×3): 50 ug via INTRAVENOUS

## 2016-08-23 MED ORDER — MIDAZOLAM HCL 2 MG/2ML IJ SOLN
INTRAMUSCULAR | Status: AC
  Filled 2016-08-23: qty 2

## 2016-08-23 MED ORDER — ONDANSETRON HCL 4 MG/2ML IJ SOLN
INTRAMUSCULAR | Status: AC
  Filled 2016-08-23: qty 2

## 2016-08-23 MED ORDER — CHLORHEXIDINE GLUCONATE CLOTH 2 % EX PADS: 6.0000 | MEDICATED_PAD | Freq: Once | CUTANEOUS | Status: DC

## 2016-08-23 MED ORDER — KCL IN DEXTROSE-NACL 20-5-0.45 MEQ/L-%-% IV SOLN
INTRAVENOUS | Status: DC
  Administered 2016-08-23: 18:00:00 via INTRAVENOUS
  Administered 2016-08-24 – 2016-08-25 (×2): 1000 mL via INTRAVENOUS
  Filled 2016-08-23 (×5): qty 1000

## 2016-08-23 MED ORDER — PANTOPRAZOLE SODIUM 40 MG IV SOLR
40.0000 mg | Freq: Every day | INTRAVENOUS | Status: DC
  Administered 2016-08-23 – 2016-08-24 (×2): 40 mg via INTRAVENOUS
  Filled 2016-08-23 (×2): qty 40

## 2016-08-23 MED ORDER — FENTANYL CITRATE (PF) 100 MCG/2ML IJ SOLN
INTRAMUSCULAR | Status: AC
  Filled 2016-08-23: qty 4

## 2016-08-23 MED ORDER — PROPOFOL 10 MG/ML IV BOLUS
INTRAVENOUS | Status: AC
  Filled 2016-08-23: qty 20

## 2016-08-23 MED ORDER — PROPOFOL 10 MG/ML IV BOLUS
INTRAVENOUS | Status: DC | PRN
  Administered 2016-08-23: 170 mg via INTRAVENOUS

## 2016-08-23 MED ORDER — EPHEDRINE SULFATE-NACL 50-0.9 MG/10ML-% IV SOSY
PREFILLED_SYRINGE | INTRAVENOUS | Status: DC | PRN
  Administered 2016-08-23: 15 mg via INTRAVENOUS
  Administered 2016-08-23 (×2): 10 mg via INTRAVENOUS

## 2016-08-23 MED ORDER — LIDOCAINE 2% (20 MG/ML) 5 ML SYRINGE
INTRAMUSCULAR | Status: DC | PRN
  Administered 2016-08-23: 100 mg via INTRAVENOUS

## 2016-08-23 MED ORDER — SODIUM CHLORIDE 0.9 % IJ SOLN
INTRAMUSCULAR | Status: AC
  Filled 2016-08-23: qty 10

## 2016-08-23 MED ORDER — ROCURONIUM BROMIDE 10 MG/ML (PF) SYRINGE
PREFILLED_SYRINGE | INTRAVENOUS | Status: DC | PRN
  Administered 2016-08-23: 20 mg via INTRAVENOUS
  Administered 2016-08-23: 50 mg via INTRAVENOUS
  Administered 2016-08-23 (×2): 10 mg via INTRAVENOUS

## 2016-08-23 MED ORDER — GABAPENTIN 300 MG PO CAPS
300.0000 mg | ORAL_CAPSULE | ORAL | Status: AC
  Administered 2016-08-23: 300 mg via ORAL
  Filled 2016-08-23: qty 1

## 2016-08-23 MED ORDER — ACETAMINOPHEN 160 MG/5ML PO SOLN: 325.0000 mg | ORAL | Status: DC | PRN

## 2016-08-23 MED ORDER — ONDANSETRON HCL 4 MG/2ML IJ SOLN
INTRAMUSCULAR | Status: DC | PRN
  Administered 2016-08-23: 4 mg via INTRAVENOUS

## 2016-08-23 MED ORDER — PHENYLEPHRINE 40 MCG/ML (10ML) SYRINGE FOR IV PUSH (FOR BLOOD PRESSURE SUPPORT)
PREFILLED_SYRINGE | INTRAVENOUS | Status: AC
  Filled 2016-08-23: qty 10

## 2016-08-23 MED ORDER — SUGAMMADEX SODIUM 500 MG/5ML IV SOLN
INTRAVENOUS | Status: DC | PRN
  Administered 2016-08-23: 500 mg via INTRAVENOUS

## 2016-08-23 MED ORDER — ACETAMINOPHEN 325 MG PO TABS: 650.0000 mg | ORAL_TABLET | ORAL | Status: DC | PRN

## 2016-08-23 MED ORDER — SUCCINYLCHOLINE CHLORIDE 200 MG/10ML IV SOSY
PREFILLED_SYRINGE | INTRAVENOUS | Status: DC | PRN
  Administered 2016-08-23: 140 mg via INTRAVENOUS

## 2016-08-23 MED ORDER — PHENYLEPHRINE 40 MCG/ML (10ML) SYRINGE FOR IV PUSH (FOR BLOOD PRESSURE SUPPORT)
PREFILLED_SYRINGE | INTRAVENOUS | Status: DC | PRN
  Administered 2016-08-23: 80 ug via INTRAVENOUS

## 2016-08-23 MED ORDER — CEFOTETAN DISODIUM-DEXTROSE 2-2.08 GM-% IV SOLR
2.0000 g | INTRAVENOUS | Status: AC
  Administered 2016-08-23: 2 g via INTRAVENOUS
  Filled 2016-08-23: qty 50

## 2016-08-23 MED ORDER — LACTATED RINGERS IR SOLN
Status: DC | PRN
  Administered 2016-08-23: 1000 mL

## 2016-08-23 MED ORDER — ACETAMINOPHEN 500 MG PO TABS
1000.0000 mg | ORAL_TABLET | ORAL | Status: AC
  Administered 2016-08-23: 1000 mg via ORAL
  Filled 2016-08-23: qty 2

## 2016-08-23 MED ORDER — LIDOCAINE 2% (20 MG/ML) 5 ML SYRINGE
INTRAMUSCULAR | Status: DC | PRN
  Administered 2016-08-23: 1 mg/kg/h via INTRAVENOUS

## 2016-08-23 MED ORDER — PREMIER PROTEIN SHAKE
2.0000 [oz_av] | ORAL | Status: DC
Start: 2016-08-24 — End: 2016-08-25
  Administered 2016-08-24 – 2016-08-25 (×8): 2 [oz_av] via ORAL

## 2016-08-23 MED ORDER — SODIUM CHLORIDE 0.9 % IJ SOLN
INTRAMUSCULAR | Status: DC | PRN
  Administered 2016-08-23: 10 mL via INTRAVENOUS

## 2016-08-23 MED ORDER — MORPHINE SULFATE (PF) 2 MG/ML IV SOLN: 1.0000 mg | INTRAVENOUS | Status: DC | PRN

## 2016-08-23 MED ORDER — DEXAMETHASONE SODIUM PHOSPHATE 4 MG/ML IJ SOLN: 4.0000 mg | INTRAMUSCULAR | Status: DC

## 2016-08-23 MED ORDER — SCOPOLAMINE 1 MG/3DAYS TD PT72
1.0000 | MEDICATED_PATCH | TRANSDERMAL | Status: DC
  Administered 2016-08-23: 1.5 mg via TRANSDERMAL
  Filled 2016-08-23: qty 1

## 2016-08-23 MED ORDER — APREPITANT 40 MG PO CAPS
40.0000 mg | ORAL_CAPSULE | ORAL | Status: AC
  Administered 2016-08-23: 40 mg via ORAL
  Filled 2016-08-23: qty 1

## 2016-08-23 SURGICAL SUPPLY — 61 items
ADH SKN CLS APL DERMABOND .7 (GAUZE/BANDAGES/DRESSINGS) ×1
APPLICATOR COTTON TIP 6IN STRL (MISCELLANEOUS) IMPLANT
APPLIER CLIP 5 13 M/L LIGAMAX5 (MISCELLANEOUS)
APPLIER CLIP ROT 10 11.4 M/L (STAPLE)
APPLIER CLIP ROT 13.4 12 LRG (CLIP)
APR CLP LRG 13.4X12 ROT 20 MLT (CLIP)
APR CLP MED LRG 11.4X10 (STAPLE)
APR CLP MED LRG 5 ANG JAW (MISCELLANEOUS)
BLADE SURG 15 STRL LF DISP TIS (BLADE) ×1 IMPLANT
BLADE SURG 15 STRL SS (BLADE) ×2
CABLE HIGH FREQUENCY MONO STRZ (ELECTRODE) ×2 IMPLANT
CLIP APPLIE 5 13 M/L LIGAMAX5 (MISCELLANEOUS) IMPLANT
CLIP APPLIE ROT 10 11.4 M/L (STAPLE) IMPLANT
CLIP APPLIE ROT 13.4 12 LRG (CLIP) IMPLANT
DERMABOND ADVANCED (GAUZE/BANDAGES/DRESSINGS) ×1
DERMABOND ADVANCED .7 DNX12 (GAUZE/BANDAGES/DRESSINGS) IMPLANT
DEVICE SUT QUICK LOAD TK 5 (STAPLE) IMPLANT
DEVICE SUT TI-KNOT TK 5X26 (MISCELLANEOUS) IMPLANT
DEVICE SUTURE ENDOST 10MM (ENDOMECHANICALS) IMPLANT
DEVICE TROCAR PUNCTURE CLOSURE (ENDOMECHANICALS) ×2 IMPLANT
DISSECTOR BLUNT TIP ENDO 5MM (MISCELLANEOUS) IMPLANT
ELECT REM PT RETURN 15FT ADLT (MISCELLANEOUS) ×2 IMPLANT
GAUZE SPONGE 4X4 12PLY STRL (GAUZE/BANDAGES/DRESSINGS) IMPLANT
GLOVE BIOGEL M 8.0 STRL (GLOVE) ×2 IMPLANT
GOWN STRL REUS W/TWL XL LVL3 (GOWN DISPOSABLE) ×8 IMPLANT
HANDLE STAPLE EGIA 4 XL (STAPLE) ×2 IMPLANT
HOVERMATT SINGLE USE (MISCELLANEOUS) ×2 IMPLANT
KIT BASIN OR (CUSTOM PROCEDURE TRAY) ×2 IMPLANT
MARKER SKIN DUAL TIP RULER LAB (MISCELLANEOUS) ×2 IMPLANT
NDL SPNL 22GX3.5 QUINCKE BK (NEEDLE) ×1 IMPLANT
NEEDLE SPNL 22GX3.5 QUINCKE BK (NEEDLE) ×2 IMPLANT
PACK UNIVERSAL I (CUSTOM PROCEDURE TRAY) ×2 IMPLANT
RELOAD STAPLE 45 PURP MED/THCK (STAPLE) IMPLANT
RELOAD TRI 45 ART MED THCK BLK (STAPLE) ×2 IMPLANT
RELOAD TRI 45 ART MED THCK PUR (STAPLE) IMPLANT
RELOAD TRI 60 ART MED THCK BLK (STAPLE) ×2 IMPLANT
RELOAD TRI 60 ART MED THCK PUR (STAPLE) ×3 IMPLANT
SCISSORS LAP 5X45 EPIX DISP (ENDOMECHANICALS) IMPLANT
SET IRRIG TUBING LAPAROSCOPIC (IRRIGATION / IRRIGATOR) ×2 IMPLANT
SHEARS HARMONIC ACE PLUS 45CM (MISCELLANEOUS) ×2 IMPLANT
SLEEVE ADV FIXATION 5X100MM (TROCAR) ×4 IMPLANT
SLEEVE GASTRECTOMY 36FR VISIGI (MISCELLANEOUS) ×2 IMPLANT
SOLUTION ANTI FOG 6CC (MISCELLANEOUS) ×2 IMPLANT
SPONGE LAP 18X18 X RAY DECT (DISPOSABLE) ×2 IMPLANT
STAPLER VISISTAT 35W (STAPLE) ×2 IMPLANT
SUT SURGIDAC NAB ES-9 0 48 120 (SUTURE) IMPLANT
SUT VIC AB 4-0 SH 18 (SUTURE) ×2 IMPLANT
SUT VICRYL 0 TIES 12 18 (SUTURE) ×2 IMPLANT
SYR 10ML ECCENTRIC (SYRINGE) ×2 IMPLANT
SYR 20CC LL (SYRINGE) ×2 IMPLANT
SYR 50ML LL SCALE MARK (SYRINGE) ×2 IMPLANT
TOWEL OR 17X26 10 PK STRL BLUE (TOWEL DISPOSABLE) ×4 IMPLANT
TOWEL OR NON WOVEN STRL DISP B (DISPOSABLE) ×2 IMPLANT
TRAY FOLEY W/METER SILVER 16FR (SET/KITS/TRAYS/PACK) IMPLANT
TROCAR ADV FIXATION 5X100MM (TROCAR) ×2 IMPLANT
TROCAR BLADELESS 15MM (ENDOMECHANICALS) ×2 IMPLANT
TROCAR BLADELESS OPT 5 100 (ENDOMECHANICALS) ×2 IMPLANT
TUBE CALIBRATION LAPBAND (TUBING) IMPLANT
TUBING CONNECTING 10 (TUBING) ×2 IMPLANT
TUBING ENDO SMARTCAP (MISCELLANEOUS) ×2 IMPLANT
TUBING INSUF HEATED (TUBING) ×2 IMPLANT

## 2016-08-23 NOTE — Transfer of Care (Signed)
Immediate Anesthesia Transfer of Care Note  Patient: [name]  Procedure(s) Performed: Procedure(s): LAPAROSCOPIC GASTRIC SLEEVE RESECTION WITH UPPER ENDO (N/A)  Patient Location: PACU  Anesthesia Type:General  Level of Consciousness:  sedated, patient cooperative and responds to stimulation  Airway & Oxygen Therapy:Patient Spontanous Breathing and Patient connected to face mask oxgen  Post-op Assessment:  Report given to PACU RN and Post -op Vital signs reviewed and stable  Post vital signs:  Reviewed and stable  Last Vitals:  Vitals:   08/23/16 0709  BP: 130/88  Pulse: 89  Resp: 16  Temp: 20.1 C    Complications: No apparent anesthesia complications

## 2016-08-23 NOTE — Progress Notes (Signed)
Visited with patient.  Family at bedside.  Discussed ambulation and use of IS.  Only complaints are of gas pain at this time patient doesn't feel like ambulating at this time.  Bedside RN aware.  No questions at this time.

## 2016-08-23 NOTE — Op Note (Signed)
Name:  [name] MRN: [id number] Date of Surgery: 08/23/2016  Preop Diagnosis:  Morbid Obesity  Postop Diagnosis:  Morbid Obesity (Weight - 293, BMI - 50.4), S/P Gastric Sleeve resection  Procedure:  Upper endoscopy  (Intraoperative)  Surgeon:  Alphonsa Overall, M.D.  Anesthesia:  GET  Indications for procedure: [name] is a 56 y.o. female whose primary care physician is Velna Hatchet, MD and has completed a gastric sleeve resection today for weight loss by Dr. Hassell Done.  I am doing an intraoperative upper endoscopy to evaluate the gastric pouch after the sleeve gastrectomy.  Operative Note: The patient is under general anesthesia.  Dr. Hassell Done is laparoscoping the patient while I do an upper endoscopy to evaluate the stomach pouch.  With the patient intubated, I passed the Pentax upper endoscope without difficulty down the esophagus.  The esophagus was unremarkable.  The esophago-gastric junction was at 38 cm.    The mucosa of the stomach looked viable and the staple line was intact without bleeding.  I advanced the scope to the pylorus, but did not go through it.  While I insufflated the stomach pouch with air, Dr. Hassell Done  flooded the upper abdomen with saline to put the gastric pouch under saline.  There was no bubbling or evidence of a leak.  There was no evidence of narrowing of the pouch and the gastric sleeve looked tubular.  The scope was then withdrawn.  The esophagus was unremarkable and the patient tolerated the endoscopy without difficulty.  Alphonsa Overall, MD, [facility] Pager: [phone] Office phone:  [phone]

## 2016-08-23 NOTE — Op Note (Signed)
Surgeon: Kaylyn Lim, MD, FACS  Asst:  Alphonsa Overall, MD,FACS  Anes:  General endotracheal  Procedure: Laparoscopic sleeve gastrectomy and upper endoscopy  Diagnosis: Morbid obesity  Complications: none  EBL:   20 cc  Description of Procedure:  The patient was take to OR 2 and given general anesthesia.  The abdomen was prepped with Technicare and draped sterilely.  A timeout was performed.  Access to the abdomen was achieved with a 5 mm Optiview through the left upper quadrant.  The adhesions from her prior open cholecystectomy were taken down with the harmonic scalpel.  Following insufflation, the state of the abdomen was found to be adhesions as noted above.  The calibration tubing was placed and the test was negative with 10 cc of air.  No dimple was seen.    The ViSiGi 36Fr tube was inserted to deflate the stomach and was pulled back into the esophagus.    The pylorus was identified and we measured 5 cm back and marked the antrum.  At that point we began dissection to take down the greater curvature of the stomach using the Harmonic scalpel.  This dissection was taken all the way up to the left crus. Bleeding near the spleen was controlled with the Harmonic scalpel.  Posterior attachments of the stomach were also taken down.    The ViSiGi tube was then passed into the antrum and suction applied so that it was snug along the lessor curvature.  The "crow's foot" or incisura was identified.  The sleeve gastrectomy was begun using the [employer] stapler beginning with a 4.5 cm Coviden black load with TRS followed by a 6 cm black with TRS.  The sleeve was completed with multiple firings of the purple loads with TRS .  When the sleeve was complete the tube was taken off suction and insufflated briefly.  The tube was withdrawn.  Upper endoscopy was then performed by Dr. Lucia Gaskins.     The specimen was extracted through the 15 trocar site.  Wounds were infiltrated with Exparel and closed with  Monocryl 4-0 and Dermabond.    Matt B. Hassell Done, [place], [facility], [place]

## 2016-08-23 NOTE — Anesthesia Procedure Notes (Signed)
Procedure Name: Intubation Date/Time: 08/23/2016 10:35 AM Performed by: Terisa Belardo, [place] Gess Pre-anesthesia Checklist: Patient identified, Emergency Drugs available, Suction available, Patient being monitored and Timeout performed Patient Re-evaluated:Patient Re-evaluated prior to induction Oxygen Delivery Method: Circle system utilized Preoxygenation: Pre-oxygenation with 100% oxygen Induction Type: IV induction Ventilation: Mask ventilation without difficulty Laryngoscope Size: Mac and 4 Grade View: Grade I Tube type: Oral Tube size: 7.5 mm Number of attempts: 1 Airway Equipment and Method: Stylet Placement Confirmation: ETT inserted through vocal cords under direct vision,  positive ETCO2,  CO2 detector and breath sounds checked- equal and bilateral Secured at: 22 cm Tube secured with: Tape Dental Injury: Teeth and Oropharynx as per pre-operative assessment

## 2016-08-23 NOTE — Anesthesia Postprocedure Evaluation (Signed)
Anesthesia Post Note  Patient: [name]  Procedure(s) Performed: Procedure(s) (LRB): LAPAROSCOPIC GASTRIC SLEEVE RESECTION WITH UPPER ENDO (N/A)     Patient location during evaluation: PACU Anesthesia Type: General Level of consciousness: awake Pain management: pain level controlled Vital Signs Assessment: post-procedure vital signs reviewed and stable Respiratory status: spontaneous breathing Cardiovascular status: stable Anesthetic complications: no    Last Vitals:  Vitals:   08/23/16 1430 08/23/16 1529  BP: 120/75 116/76  Pulse: 66 72  Resp: 16 16  Temp: (!) 36.3 C 36.4 C    Last Pain:  Vitals:   08/23/16 1529  TempSrc: Oral  PainSc:                  Emery Dupuy

## 2016-08-23 NOTE — Discharge Instructions (Signed)
° ° ° °GASTRIC BYPASS/SLEEVE ° Home Care Instructions ° ° These instructions are to help you care for yourself when you go home. ° °Call: If you have any problems. °• Call [phone] and ask for the surgeon on call °• If you need immediate assistance come to the ER at [facility]. Tell the ER staff you are a new post-op gastric bypass or gastric sleeve patient  °Signs and symptoms to report: • Severe  vomiting or nausea °o If you cannot handle clear liquids for longer than 1 day, call your surgeon °• Abdominal pain which does not get better after taking your pain medication °• Fever greater than 100.4°  F and chills °• Heart rate over 100 beats a minute °• Trouble breathing °• Chest pain °• Redness,  swelling, drainage, or foul odor at incision (surgical) sites °• If your incisions open or pull apart °• Swelling or pain in calf (lower leg) °• Diarrhea (Loose bowel movements that happen often), frequent watery, uncontrolled bowel movements °• Constipation, (no bowel movements for 3 days) if this happens: °o Take Milk of Magnesia, 2 tablespoons by mouth, 3 times a day for 2 days if needed °o Stop taking Milk of Magnesia once you have had a bowel movement °o Call your doctor if constipation continues °Or °o Take Miralax  (instead of Milk of Magnesia) following the label instructions °o Stop taking Miralax once you have had a bowel movement °o Call your doctor if constipation continues °• Anything you think is “abnormal for you” °  °Normal side effects after surgery: • Unable to sleep at night or unable to concentrate °• Irritability °• Being tearful (crying) or depressed ° °These are common complaints, possibly related to your anesthesia, stress of surgery, and change in lifestyle, that usually go away a few weeks after surgery. If these feelings continue, call your medical doctor.  °Wound Care: You may have surgical glue, steri-strips, or staples over your incisions after surgery °• Surgical glue: Looks like clear  film over your incisions and will wear off a little at a time °• Steri-strips: Adhesive strips of tape over your incisions. You may notice a yellowish color on skin under the steri-strips. This is used to make the steri-strips stick better. Do not pull the steri-strips off - let them fall off °• Staples: Staples may be removed before you leave the hospital °o If you go home with staples, call [facility] for an appointment with your surgeon’s nurse to have staples removed 10 days after surgery, [phone] °• Showering: You may shower two (2) days after your surgery unless your surgeon tells you differently °o Wash gently around incisions with warm soapy water, rinse well, and gently pat dry °o If you have a drain (tube from your incision), you may need someone to hold this while you shower °o No tub baths until staples are removed and incisions are healed °  °Medications: • Medications should be liquid or crushed if larger than the size of a dime °• Extended release pills (medication that releases a little bit at a time through the  day) should not be crushed °• Depending on the size and number of medications you take, you may need to space (take a few throughout the day)/change the time you take your medications so that you do not over-fill your pouch (smaller stomach) °• Make sure you follow-up with you primary care physician to make medication changes needed during rapid weight loss and life -style changes °•   If you have diabetes, follow up with your doctor that orders your diabetes medication(s) within one week after surgery and check your blood sugar regularly ° °• Do not drive while taking narcotics (pain medications) ° °• Do not take acetaminophen (Tylenol) and Roxicet or Lortab Elixir at the same time since these pain medications contain acetaminophen °  °Diet:  °First 2 Weeks You will see the nutritionist about two (2) weeks after your surgery. The nutritionist will increase the types of  foods you can eat if you are handling liquids well: °• If you have severe vomiting or nausea and cannot handle clear liquids lasting longer than 1 day call your surgeon °Protein Shake °• Drink at least 2 ounces of shake 5-6 times per day °• Each serving of protein shakes (usually 8-12 ounces) should have a minimum of: °o 15 grams of protein °o And no more than 5 grams of carbohydrate °• Goal for protein each day: °o Men = 80 grams per day °o Women = 60 grams per day °  ° • Protein powder may be added to fluids such as non-fat milk or Lactaid milk or Soy milk (limit to 35 grams added protein powder per serving) ° °Hydration °• Slowly increase the amount of water and other clear liquids as tolerated (See Acceptable Fluids) °• Slowly increase the amount of protein shake as tolerated °• Sip fluids slowly and throughout the day °• May use sugar substitutes in small amounts (no more than 6-8 packets per day; i.e. Splenda) ° °Fluid Goal °• The first goal is to drink at least 8 ounces of protein shake/drink per day (or as directed by the nutritionist); some examples of protein shakes are Syntrax Nectar, Adkins Advantage, EAS Edge HP, and Unjury. - See handout from pre-op Bariatric Education Class: °o Slowly increase the amount of protein shake you drink as tolerated °o You may find it easier to slowly sip shakes throughout the day °o It is important to get your proteins in first °• Your fluid goal is to drink 64-100 ounces of fluid daily °o It may take a few weeks to build up to this  °• 32 oz. (or more) should be clear liquids °And °• 32 oz. (or more) should be full liquids (see below for examples) °• Liquids should not contain sugar, caffeine, or carbonation ° °Clear Liquids: °• Water of Sugar-free flavored water (i.e. Fruit H²O, Propel) °• Decaffeinated coffee or tea (sugar-free) °• Crystal lite, Wyler’s Lite, Minute Maid Lite °• Sugar-free Jell-O °• Bouillon or broth °• Sugar-free Popsicle:    - Less than 20 calories  each; Limit 1 per day ° °Full Liquids: °                  Protein Shakes/Drinks + 2 choices per day of other full liquids °• Full liquids must be: °o No More Than 12 grams of Carbs per serving °o No More Than 3 grams of Fat per serving °• Strained low-fat cream soup °• Non-Fat milk °• Fat-free Lactaid Milk °• Sugar-free yogurt (Dannon Lite & Fit, Greek yogurt) ° °  °Vitamins and Minerals • Start 1 day after surgery unless otherwise directed by your surgeon °• 2 Chewable Bariatric Multivitamin / Multimineral Supplement with iron °• Chewable Calcium Citrate with Vitamin D-3 °(Example: 3 Chewable Calcium  Plus 600 with Vitamin D-3) °o Take 500 mg three (3) times a day for a total of 1500 mg each day °o Do not take all 3 doses of calcium   at one time as it may cause constipation, and you can only absorb 500 mg at a time °o Do not mix multivitamins containing iron with calcium supplements;  take 2 hours apart °• Menstruating women and those at risk for anemia ( a blood disease that causes weakness) may need extra iron °o Talk to your doctor to see if you need more iron °• If you need extra iron: Total daily Iron recommendation (including Vitamins) is 50 to 100 mg Iron/day °• Do not stop taking or change any vitamins or minerals until you talk to your nutritionist or surgeon °• Your nutritionist and/or surgeon must approve all vitamin and mineral supplements °  °Activity and Exercise: It is important to continue walking at home. Limit your physical activity as instructed by your doctor. During this time, use these guidelines: °• Do not lift anything greater than ten  (10) pounds for at least two (2) weeks °• Do not go back to work or drive until your surgeon says you can °• You may have sex when you feel comfortable °o It is VERY important for female patients to use a reliable birth control method; fertility often increase after surgery °o Do not get pregnant for at least 18 months °• Start exercising as soon as your  doctor tells you that you can °o Make sure your doctor approves any physical activity °• Start with a simple walking program °• Walk 5-15 minutes each day, 7 days per week °• Slowly increase until you are walking 30-45 minutes per day °• Consider joining our BELT program. [phone] or email [email or web address] °  °Special Instructions Things to remember: °• Use your CPAP when sleeping if this applies to you °• Consider buying a medical alert bracelet that says you had lap-band surgery °  °  You will likely have your first fill (fluid added to your band) 6 - 8 weeks after surgery °• [facility] has a free Bariatric Surgery Support Group that meets monthly, the 3rd Thursday, 6pm. [facility] Classrooms. You can see classes online at [email or web address] °• It is very important to keep all follow up appointments with your surgeon, nutritionist, primary care physician, and behavioral health practitioner °o After the first year, please follow up with your bariatric surgeon and nutritionist at least once a year in order to maintain best weight loss results °      °             [facility]:  [phone] ° °             [facility]: [phone] ° °             Bariatric Nurse Coordinator: [phone]  °Gastric Bypass/Sleeve Home Care Instructions  Rev. 02/2012    ° °                                                    Reviewed and Endorsed °                                                   by [place] Patient Education Committee, Jan, 2014 ° ° ° ° ° ° ° ° ° °

## 2016-08-23 NOTE — Anesthesia Postprocedure Evaluation (Signed)
Anesthesia Post Note  Patient: [name]  Procedure(s) Performed: Procedure(s) (LRB): LAPAROSCOPIC GASTRIC SLEEVE RESECTION WITH UPPER ENDO (N/A)     Patient location during evaluation: PACU Anesthesia Type: General Level of consciousness: awake Pain management: pain level controlled Vital Signs Assessment: post-procedure vital signs reviewed and stable Respiratory status: spontaneous breathing Cardiovascular status: stable Anesthetic complications: no    Last Vitals:  Vitals:   08/23/16 0709  BP: 130/88  Pulse: 89  Resp: 16  Temp: 37.1 C    Last Pain:  Vitals:   08/23/16 0728  TempSrc:   PainSc: 10-Worst pain ever                 Malia Corsi

## 2016-08-23 NOTE — Anesthesia Preprocedure Evaluation (Addendum)
Anesthesia Evaluation  Patient identified by MRN, date of birth, ID band Patient awake    Reviewed: Allergy & Precautions, NPO status , Patient's Chart, lab work & pertinent test results  Airway Mallampati: II  TM Distance: >3 FB     Dental   Pulmonary sleep apnea , former smoker,    breath sounds clear to auscultation       Cardiovascular hypertension, + Peripheral Vascular Disease   Rhythm:Regular Rate:Normal     Neuro/Psych  Headaches,    GI/Hepatic Neg liver ROS, GERD  ,  Endo/Other  diabetesHypothyroidism   Renal/GU negative Renal ROS     Musculoskeletal  (+) Arthritis ,   Abdominal   Peds  Hematology  (+) anemia ,   Anesthesia Other Findings   Reproductive/Obstetrics                             Anesthesia Physical Anesthesia Plan  ASA: III  Anesthesia Plan: General   Post-op Pain Management:    Induction: Intravenous  PONV Risk Score and Plan: 2 and Ondansetron, Dexamethasone, Propofol infusion and Treatment may vary due to age or medical condition  Airway Management Planned: Oral ETT  Additional Equipment:   Intra-op Plan:   Post-operative Plan: Possible Post-op intubation/ventilation  Informed Consent: I have reviewed the patients History and Physical, chart, labs and discussed the procedure including the risks, benefits and alternatives for the proposed anesthesia with the patient or authorized representative who has indicated his/her understanding and acceptance.   Dental advisory given  Plan Discussed with: CRNA and Anesthesiologist  Anesthesia Plan Comments:        Anesthesia Quick Evaluation

## 2016-08-24 ENCOUNTER — Encounter (HOSPITAL_COMMUNITY): Payer: Self-pay | Admitting: Surgery

## 2016-08-24 LAB — CBC WITH DIFFERENTIAL/PLATELET
BASOS PCT: 0 %
Basophils Absolute: 0 10*3/uL (ref 0.0–0.1)
EOS ABS: 0 10*3/uL (ref 0.0–0.7)
EOS PCT: 0 %
HCT: 34.9 % — ABNORMAL LOW (ref 36.0–46.0)
HEMOGLOBIN: 11.2 g/dL — AB (ref 12.0–15.0)
LYMPHS ABS: 1.3 10*3/uL (ref 0.7–4.0)
Lymphocytes Relative: 13 %
MCH: 26.4 pg (ref 26.0–34.0)
MCHC: 32.1 g/dL (ref 30.0–36.0)
MCV: 82.1 fL (ref 78.0–100.0)
MONO ABS: 0.6 10*3/uL (ref 0.1–1.0)
MONOS PCT: 6 %
NEUTROS PCT: 81 %
Neutro Abs: 8.1 10*3/uL — ABNORMAL HIGH (ref 1.7–7.7)
PLATELETS: 289 10*3/uL (ref 150–400)
RBC: 4.25 MIL/uL (ref 3.87–5.11)
RDW: 15 % (ref 11.5–15.5)
WBC: 10 10*3/uL (ref 4.0–10.5)

## 2016-08-24 NOTE — Progress Notes (Signed)
Patient alert and oriented, Post op day 1.  Provided support and encouragement.  Encouraged pulmonary toilet, ambulation and small sips of liquids.  Patient has had 8 ounces of fluid.  All questions answered.  Will continue to monitor.

## 2016-08-24 NOTE — Plan of Care (Signed)
Problem: Food- and Nutrition-Related Knowledge Deficit (NB-1.1) Goal: Nutrition education Formal process to instruct or train a patient/client in a skill or to impart knowledge to help patients/clients voluntarily manage or modify food choices and eating behavior to maintain or improve health. Outcome: Completed/Met Date Met: 08/24/16 Nutrition Education Note  Received consult for diet education per DROP protocol.   Discussed 2 week post op diet with pt. Emphasized that liquids must be non carbonated, non caffeinated, and sugar free. Fluid goals discussed. Pt to follow up with outpatient bariatric RD for further diet progression after 2 weeks. Multivitamins and minerals also reviewed. Teach back method used, pt expressed understanding, expect good compliance.   Diet: First 2 Weeks  You will see the nutritionist about two (2) weeks after your surgery. The nutritionist will increase the types of foods you can eat if you are handling liquids well:  If you have severe vomiting or nausea and cannot handle clear liquids lasting longer than 1 day, call your surgeon  Protein Shake  Drink at least 2 ounces of shake 5-6 times per day  Each serving of protein shakes (usually 8 - 12 ounces) should have a minimum of:  15 grams of protein  And no more than 5 grams of carbohydrate  Goal for protein each day:  Men = 80 grams per day  Women = 60 grams per day  Protein powder may be added to fluids such as non-fat milk or Lactaid milk or Soy milk (limit to 35 grams added protein powder per serving)   Hydration  Slowly increase the amount of water and other clear liquids as tolerated (See Acceptable Fluids)  Slowly increase the amount of protein shake as tolerated  Sip fluids slowly and throughout the day  May use sugar substitutes in small amounts (no more than 6 - 8 packets per day; i.e. Splenda)   Fluid Goal  The first goal is to drink at least 8 ounces of protein shake/drink per day (or as directed  by the nutritionist); some examples of protein shakes are Premier Protein, [employer], [employer], EAS Edge HP, and Unjury. See handout from pre-op Bariatric Education Class:  Slowly increase the amount of protein shake you drink as tolerated  You may find it easier to slowly sip shakes throughout the day  It is important to get your proteins in first  Your fluid goal is to drink 64 - 100 ounces of fluid daily  It may take a few weeks to build up to this  32 oz (or more) should be clear liquids  And  32 oz (or more) should be full liquids (see below for examples)  Liquids should not contain sugar, caffeine, or carbonation   Clear Liquids:  Water or Sugar-free flavored water (i.e. Fruit H2O, Propel)  Decaffeinated coffee or tea (sugar-free)  Crystal Lite, Wyler's Lite, Minute Maid Lite  Sugar-free Jell-O  Bouillon or broth  Sugar-free Popsicle: *Less than 20 calories each; Limit 1 per day   Full Liquids:  Protein Shakes/Drinks + 2 choices per day of other full liquids  Full liquids must be:  No More Than 12 grams of Carbs per serving  No More Than 3 grams of Fat per serving  Strained low-fat cream soup  Non-Fat milk  Fat-free Lactaid Milk  Sugar-free yogurt (Dannon Lite & Fit, [place] yogurt, Oikos Zero)   Clayton Bibles, MS, RD, LDN Pager: [phone] After Hours Pager: [phone]

## 2016-08-24 NOTE — Progress Notes (Signed)
Patient placed self on CPAP. Patient is tolerating well.

## 2016-08-24 NOTE — Progress Notes (Signed)
Patient ID: [name], female   DOB: 19-Jun-1960, 56 y.o.   MRN: [id number] [facility] Surgery Progress Note:   1 Day Post-Op  Subjective: Mental status is clear.   Objective: Vital signs in last 24 hours: Temp:  [97.4 F (36.3 C)-98.6 F (37 C)] 98.3 F (36.8 C) (07/31 1038) Pulse Rate:  [63-88] 88 (07/31 1244) Resp:  [15-20] 19 (07/31 1244) BP: (116-172)/(72-88) 147/72 (07/31 1244) SpO2:  [95 %-100 %] 95 % (07/31 1244) Weight:  [136.8 kg (301 lb 9.6 oz)] 136.8 kg (301 lb 9.6 oz) (07/31 0845)  Intake/Output from previous day: 07/30 0701 - 07/31 0700 In: 2976.7 [P.O.:300; I.V.:2676.7] Out: 800 [Urine:750; Blood:50] Intake/Output this shift: Total I/O In: 588.3 [P.O.:120; I.V.:468.3] Out: 925 [Urine:925]  Physical Exam: Work of breathing is OK.  Drinking slowly.  Incisions OK  Lab Results:  Results for orders placed or performed during the hospital encounter of 08/23/16 (from the past 48 hour(s))  Glucose, capillary     Status: Abnormal   Collection Time: 08/23/16  1:05 PM  Result Value Ref Range   Glucose-Capillary 118 (H) 65 - 99 mg/dL  Hemoglobin and hematocrit, blood     Status: None   Collection Time: 08/23/16  1:46 PM  Result Value Ref Range   Hemoglobin 12.3 12.0 - 15.0 g/dL   HCT 37.7 36.0 - 46.0 %  CBC     Status: None   Collection Time: 08/23/16  3:19 PM  Result Value Ref Range   WBC 8.8 4.0 - 10.5 K/uL   RBC 4.51 3.87 - 5.11 MIL/uL   Hemoglobin 12.2 12.0 - 15.0 g/dL   HCT 37.3 36.0 - 46.0 %   MCV 82.7 78.0 - 100.0 fL   MCH 27.1 26.0 - 34.0 pg   MCHC 32.7 30.0 - 36.0 g/dL   RDW 15.0 11.5 - 15.5 %   Platelets 276 150 - 400 K/uL  Creatinine, serum     Status: None   Collection Time: 08/23/16  3:19 PM  Result Value Ref Range   Creatinine, Ser 0.64 0.44 - 1.00 mg/dL   GFR calc non Af Amer >60 >60 mL/min   GFR calc Af Amer >60 >60 mL/min    Comment: (NOTE) The eGFR has been calculated using the CKD EPI equation. This calculation has not been  validated in all clinical situations. eGFR's persistently <60 mL/min signify possible Chronic Kidney Disease.   CBC WITH DIFFERENTIAL     Status: Abnormal   Collection Time: 08/24/16  5:17 AM  Result Value Ref Range   WBC 10.0 4.0 - 10.5 K/uL   RBC 4.25 3.87 - 5.11 MIL/uL   Hemoglobin 11.2 (L) 12.0 - 15.0 g/dL   HCT 34.9 (L) 36.0 - 46.0 %   MCV 82.1 78.0 - 100.0 fL   MCH 26.4 26.0 - 34.0 pg   MCHC 32.1 30.0 - 36.0 g/dL   RDW 15.0 11.5 - 15.5 %   Platelets 289 150 - 400 K/uL   Neutrophils Relative % 81 %   Neutro Abs 8.1 (H) 1.7 - 7.7 K/uL   Lymphocytes Relative 13 %   Lymphs Abs 1.3 0.7 - 4.0 K/uL   Monocytes Relative 6 %   Monocytes Absolute 0.6 0.1 - 1.0 K/uL   Eosinophils Relative 0 %   Eosinophils Absolute 0.0 0.0 - 0.7 K/uL   Basophils Relative 0 %   Basophils Absolute 0.0 0.0 - 0.1 K/uL    Radiology/Results: No results found.  Anti-infectives: Anti-infectives  Start     Dose/Rate Route Frequency Ordered Stop   08/23/16 [phone]  cefoTEtan in Dextrose 5% (CEFOTAN) IVPB 2 g     2 g Intravenous On call to O.R. 08/23/16 2831 08/23/16 1047      Assessment/Plan: Problem List: Patient Active Problem List   Diagnosis Date Noted  . 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 Prediabetes 12/03/2015  . OSA on CPAP 02/19/2015  . Morbid obesity ([place]) 10/09/2014  . Pulmonary HTN ([place]) 05/12/2013  . Bilateral lower extremity edema 05/12/2013  . Morbid obesity with BMI of 50.0-59.9, adult ([place])   . Back pain, hx of prior back surgery 12/09/2011  . Pancreatitis, mild, CT negative 12/08/2011  . Fatty liver 12/08/2011  . Substernal chest pain, suspected secondary to acute pancreatitis 12/06/2011  . Hypertension 12/06/2011  . Hypokalemia 12/06/2011  . Hypothyroidism 12/06/2011  . GERD (gastroesophageal reflux disease) 12/06/2011  . Anxiety 12/06/2011  . BV (bacterial vaginosis)   . Hyperlipidemia     Not quite drinking enough to go home.  Continue  observation. 1 Day Post-Op    LOS: 1 day   Matt B. Hassell Done, MD, [facility] [phone] beeper [phone]  08/24/2016 1:51 PM

## 2016-08-24 NOTE — Progress Notes (Signed)
Patient alert and oriented, pain is controlled. Patient is tolerating fluids, advanced to protein shake today, patient is tolerating well.  Reviewed Gastric sleeve discharge instructions with patient and patient is able to articulate understanding.  Provided information on BELT program, Support Group and [facility] outpatient pharmacy. All questions answered, will continue to monitor.  

## 2016-08-24 NOTE — Consult Note (Signed)
   [facility] CM Inpatient Consult   08/24/2016  [name] 1961/01/13 [id number]   Came to visit [name] on behalf of [facility] Care Management program for [employer] employees/dependents with [facility] insurance.   [name] denies having any [facility] needs at this time. Provided Link to [employer], contact information, and 24-hr nurse line magnet.   Agreeable to post hospital discharge call. Confirmed best contact number as [phone].   Appreciative of visit.   Inpatient RNCM aware of bedside visit.    Marthenia Rolling, MSN-Ed, RN,BSN [facility] Liaison [phone]

## 2016-08-25 ENCOUNTER — Other Ambulatory Visit: Payer: Self-pay | Admitting: *Deleted

## 2016-08-25 LAB — CBC WITH DIFFERENTIAL/PLATELET
BASOS ABS: 0 10*3/uL (ref 0.0–0.1)
BASOS PCT: 0 %
EOS ABS: 0 10*3/uL (ref 0.0–0.7)
Eosinophils Relative: 0 %
HEMATOCRIT: 35.5 % — AB (ref 36.0–46.0)
Hemoglobin: 11.5 g/dL — ABNORMAL LOW (ref 12.0–15.0)
Lymphocytes Relative: 22 %
Lymphs Abs: 2.2 10*3/uL (ref 0.7–4.0)
MCH: 26.9 pg (ref 26.0–34.0)
MCHC: 32.4 g/dL (ref 30.0–36.0)
MCV: 82.9 fL (ref 78.0–100.0)
MONO ABS: 1.2 10*3/uL — AB (ref 0.1–1.0)
Monocytes Relative: 12 %
NEUTROS ABS: 6.5 10*3/uL (ref 1.7–7.7)
Neutrophils Relative %: 66 %
PLATELETS: 279 10*3/uL (ref 150–400)
RBC: 4.28 MIL/uL (ref 3.87–5.11)
RDW: 15.3 % (ref 11.5–15.5)
WBC: 10 10*3/uL (ref 4.0–10.5)

## 2016-08-25 MED ORDER — OXYCODONE HCL 5 MG/5ML PO SOLN: 5.0000 mg | ORAL | 0 refills | Status: DC | PRN

## 2016-08-25 MED ORDER — ONDANSETRON 4 MG PO TBDP: 4.0000 mg | ORAL_TABLET | Freq: Three times a day (TID) | ORAL | 0 refills | Status: DC | PRN

## 2016-08-25 NOTE — Progress Notes (Signed)
Discharge planning, no HH needs identified. [phone] 

## 2016-08-25 NOTE — Progress Notes (Signed)
Patient alert and oriented, Post op day 2.  Provided support and encouragement.  Encouraged pulmonary toilet, ambulation and small sips of liquids. Patient completed shake overnight with continued clear fluid intake.  We reviewed discharge information.  Patient asked to make follow up appointment with primary care physician. All questions answered.  Will continue to monitor.

## 2016-08-25 NOTE — Progress Notes (Signed)
Patient given discharge instructions, expresses understanding. Prescriptions given, home in private automobile.

## 2016-08-25 NOTE — Discharge Summary (Signed)
Physician Discharge Summary  Patient ID: [name] MRN: [id number] DOB/AGE: [birth year] 56 y.o.  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  Struggled a bit on Tuesday with oral intake but this got better.  Ready for discharge on Wednesday (PD 2)  Consults: none  Significant Diagnostic Studies: none    Discharge Exam: Blood pressure (!) 157/80, pulse 86, temperature 98.5 F (36.9 C), temperature source Oral, resp. rate 18, height 5' 3.5" (1.613 m), weight (!) 136.8 kg (301 lb 9.6 oz), SpO2 99 %. Incisions OK  Disposition: 01-Home or Self Care  Discharge Instructions    AMB Referral to [place] Management    Complete by:  As directed    Please assign UMR member for post discharge call. Currently at [facility]. Discharge for today 08/24/16. Thanks. Marthenia Rolling, [place], RN,BSN-[facility] Liaison-[phone]   Reason for consult:  Please assign UMR member for post discharge call   Expected date of contact:  1-3 days (reserved for hospital discharges)   Ambulate hourly while awake    Complete by:  As directed    Call MD for:  difficulty breathing, headache or visual disturbances    Complete by:  As directed    Call MD for:  persistant dizziness or light-headedness    Complete by:  As directed    Call MD for:  persistant nausea and vomiting    Complete by:  As directed    Call MD for:  redness, tenderness, or signs of infection (pain, swelling, redness, odor or green/yellow discharge around incision site)    Complete by:  As directed    Call MD for:  severe uncontrolled pain    Complete by:  As directed    Call MD for:  temperature >101 F    Complete by:  As directed     Diet bariatric full liquid    Complete by:  As directed    Incentive spirometry    Complete by:  As directed    Perform hourly while awake     Allergies as of 08/25/2016      Reactions   Metronidazole    Developed pancreatitis after taking this   Adhesive [tape] Itching, Rash   Latex Itching, Rash   Penicillins Rash   Has patient had a PCN reaction causing immediate rash, facial/tongue/throat swelling, SOB or lightheadedness with hypotension: No Has patient had a PCN reaction causing severe rash involving mucus membranes or skin necrosis: No Has patient had a PCN reaction that required hospitalization No Has patient had a PCN reaction occurring within the last 10 years: No If all of the above answers are "NO", then may proceed with Cephalosporin use.   Vesicare [solifenacin] Rash      Medication List    STOP taking these medications   meloxicam 15 MG tablet Commonly known as:  MOBIC     TAKE these medications   albuterol 108 (90 Base) MCG/ACT inhaler Commonly known as:  PROVENTIL HFA;VENTOLIN HFA Inhale 1-2 puffs into the lungs every 6 (six) hours as needed for wheezing or shortness of breath.   aspirin EC 81 MG tablet Take 81 mg by mouth daily. Notes to patient:  Avoid NSAIDs for 6-8 weeks after surgery  Biotin 5000 MCG Caps Take 5,000 mcg by mouth daily.   diazepam 5 MG tablet Commonly known as:  VALIUM Take 0.5-1 tablets (2.5-5 mg total) by mouth every 6 (six) hours as needed for muscle spasms or sedation.   docusate sodium 100 MG capsule Commonly known as:  COLACE Take 100 mg by mouth daily.   esomeprazole 40 MG capsule Commonly known as:  NEXIUM Take 40 mg by mouth daily.   Fish Oil 1000 MG Caps Take 1,000 mg by mouth daily.   fluticasone 50 MCG/ACT nasal spray Commonly known as:  FLONASE Place 1 spray into both nostrils daily as needed for allergies.   furosemide 10 MG/ML solution Commonly known as:  LASIX Take 6 mLs (60 mg total) by mouth  daily. Notes to patient:  Monitor Blood Pressure Daily and keep a log for primary care physician.  Monitor for symptoms of dehydration.  You may need to make changes to your medications with rapid weight loss.     ibuprofen 200 MG tablet Commonly known as:  ADVIL,MOTRIN Take 600 mg by mouth 3 (three) times daily as needed for moderate pain. Notes to patient:  Avoid NSAIDs for 6-8 weeks after surgery   irbesartan 150 MG tablet Commonly known as:  AVAPRO Take 1 tablet (150 mg total) by mouth daily. Notes to patient:  Monitor Blood Pressure Daily and keep a log for primary care physician.  You may need to make changes to your medications with rapid weight loss.     levothyroxine 75 MCG tablet Commonly known as:  SYNTHROID, LEVOTHROID Take 75 mcg by mouth daily before breakfast.   loratadine 10 MG tablet Commonly known as:  CLARITIN Take 10 mg by mouth daily.   LYRICA 150 MG capsule Generic drug:  pregabalin TAKE 1 CAPSULE BY MOUTH TWICE DAILY   magnesium oxide 400 MG tablet Commonly known as:  MAG-OX Take 400 mg by mouth daily.   metoprolol tartrate 25 MG tablet Commonly known as:  LOPRESSOR Take 25 mg by mouth 2 (two) times daily. Notes to patient:  Monitor Blood Pressure Daily and keep a log for primary care physician.  You may need to make changes to your medications with rapid weight loss.     potassium chloride 20 MEQ/15ML (10%) Soln Take 15 mLs (20 mEq total) by mouth daily.   potassium chloride SA 20 MEQ tablet Commonly known as:  KLOR-CON M20 Take 1 tablet (20 mEq total) by mouth daily.   SYSTANE OP Place 1 drop into both eyes daily as needed (dry eyes).   traMADol 50 MG tablet Commonly known as:  ULTRAM Take 1 tablet (50 mg total) by mouth every 6 (six) hours as needed.      Follow-up Information    Johnathan Hausen, MD. Go on 09/15/2016.   Specialty:  General Surgery Why:  at 130 pm Contact information: [address] [place]  [address] [phone]        Johnathan Hausen, MD Follow up.   Specialty:  General Surgery Contact information: [place] [address] [phone]           Signed: Pedro Earls 08/25/2016, 8:27 AM

## 2016-08-26 ENCOUNTER — Other Ambulatory Visit: Payer: Self-pay | Admitting: *Deleted

## 2016-08-26 ENCOUNTER — Other Ambulatory Visit: Payer: Self-pay

## 2016-08-26 MED ORDER — IRBESARTAN 150 MG PO TABS: 150.0000 mg | ORAL_TABLET | Freq: Every day | ORAL | 3 refills | Status: DC

## 2016-08-26 NOTE — Patient Outreach (Signed)
Spoke with [name] to complete post hospital discharge transition of care call. She states she spoke with the bariatric nurse coordinator this morning to clarify the questions she had about her medications. She says her weight loss goal is to weight 140-145 lbs. Verified her home address and will mail her a successful outreach letter with [facility] CM service and contact information. Will close case as no further discharge needs identified. Barrington Ellison RN,CCM,CDE [place] Management Coordinator [facility] and [employer] [phone] Office Fax [phone]

## 2016-09-02 ENCOUNTER — Telehealth (HOSPITAL_COMMUNITY): Payer: Self-pay

## 2016-09-02 NOTE — Telephone Encounter (Signed)
Made discharge phone call to patient . Asking the following questions.    1. Do you have someone to care for you now that you are home?  independt 2. Are you having pain now that is not relieved by your pain medication?  yesAre you able to drink the recommended daily amount of fluids (48 ounces minimum/day) and protein (60-80 grams/day) as prescribed by the dietitian or nutritional counselor?  60 grams of protein, 42 ounces 3. Are you taking the vitamins and minerals as prescribed?  Trying to take forgets to take them, discussed baritastic app 4. Do you have the "on call" number to contact your surgeon if you have a problem or question?  yes 5. Are your incisions free of redness, swelling or drainage? (If steri strips, address that these can fall off, shower as tolerated) yes 6. Have your bowels moved since your surgery?  If not, are you passing gas?  yes 7. Are you up and walking 3-4 times per day?yes   8. Were you provided your discharge medications before your surgery or before you were discharged from the hospital and are you taking them without problem?  yes

## 2016-09-07 ENCOUNTER — Encounter: Payer: 59 | Attending: Surgery | Admitting: Registered"

## 2016-09-07 DIAGNOSIS — E669 Obesity, unspecified: Secondary | ICD-10-CM

## 2016-09-07 DIAGNOSIS — Z9049 Acquired absence of other specified parts of digestive tract: Secondary | ICD-10-CM | POA: Insufficient documentation

## 2016-09-07 DIAGNOSIS — Z6841 Body Mass Index (BMI) 40.0 and over, adult: Secondary | ICD-10-CM | POA: Insufficient documentation

## 2016-09-07 DIAGNOSIS — Z713 Dietary counseling and surveillance: Secondary | ICD-10-CM | POA: Insufficient documentation

## 2016-09-07 DIAGNOSIS — Z9889 Other specified postprocedural states: Secondary | ICD-10-CM | POA: Insufficient documentation

## 2016-09-07 NOTE — Progress Notes (Signed)
Bariatric Class:  Appt start time: 1530 end time:  1630.  2 Week Post-Operative Nutrition Class  Patient was seen on 09/07/2016 for [facility]ative [facility]    Pt reports sweating more than usual and some dizziness/lighteadedness. RD educated pt on importance of meeting fluid needs and ways to increase intake.    Surgery date: 08/23/2016 Surgery type: Sleeve gastrectomy Start weight at [facility]: 287 on 02/18/2016 (Pre-op class weight 309 lbs on 08/16/2016) Weight today: 286.4 Weight change: 0.6 lbs from initial (22.6 lbs loss from Pre-op class wt)  TANITA  BODY COMP RESULTS  09/07/2016   BMI (kg/m^2) 50.7   Fat Mass (lbs) 160.6   Fat Free Mass (lbs) 125.8   Total Body Water (lbs) 93.4   The following the learning objectives were met by the patient during this course:  Identifies Phase 3A (Soft, High Proteins) Dietary Goals and will begin from 2 weeks post-operatively to 2 months post-operatively  Identifies appropriate sources of fluids and proteins   States protein recommendations and appropriate sources post-operatively  Identifies the need for appropriate texture modifications, mastication, and bite sizes when consuming solids  Identifies appropriate multivitamin and calcium sources post-operatively  Describes the need for physical activity post-operatively and will follow MD recommendations  States when to call healthcare provider regarding medication questions or post-operative complications  Handouts given during class include:  Phase 3A: Soft, High Protein Diet Handout  Follow-Up Plan: Patient will follow-up at [facility] in 6 weeks for 2 month post-op nutrition visit for diet advancement per MD.

## 2016-09-10 ENCOUNTER — Telehealth (INDEPENDENT_AMBULATORY_CARE_PROVIDER_SITE_OTHER): Payer: Self-pay

## 2016-09-10 NOTE — Telephone Encounter (Signed)
Pt left vm requesting to set up another injection. Had Rt Si joint inj on 03/31/16. Ok to rpt is same symptoms?

## 2016-09-10 NOTE — Telephone Encounter (Signed)
Yes ok, likely will do OV as well

## 2016-09-13 NOTE — Telephone Encounter (Signed)
Scheduled for 8/24 at 0830. 30 minute appointment for allow for OV.

## 2016-09-17 ENCOUNTER — Encounter (INDEPENDENT_AMBULATORY_CARE_PROVIDER_SITE_OTHER): Payer: Self-pay | Admitting: Physical Medicine and Rehabilitation

## 2016-09-17 ENCOUNTER — Ambulatory Visit (INDEPENDENT_AMBULATORY_CARE_PROVIDER_SITE_OTHER): Payer: 59

## 2016-09-17 ENCOUNTER — Ambulatory Visit (INDEPENDENT_AMBULATORY_CARE_PROVIDER_SITE_OTHER): Payer: 59 | Admitting: Physical Medicine and Rehabilitation

## 2016-09-17 DIAGNOSIS — G894 Chronic pain syndrome: Secondary | ICD-10-CM | POA: Diagnosis not present

## 2016-09-17 DIAGNOSIS — M961 Postlaminectomy syndrome, not elsewhere classified: Secondary | ICD-10-CM | POA: Diagnosis not present

## 2016-09-17 DIAGNOSIS — M461 Sacroiliitis, not elsewhere classified: Secondary | ICD-10-CM

## 2016-09-17 NOTE — Patient Instructions (Signed)

## 2016-09-17 NOTE — Progress Notes (Signed)
[name] - 56 y.o. female MRN [id number]  Date of birth: [birth year]  Office Visit Note: Visit Date: 09/17/2016 PCP: Velna Hatchet, MD Referred by: Velna Hatchet, MD  Subjective: Chief Complaint  Patient presents with  . Lower Back - Pain   HPI: [name] is a 57 year old female that we've seen over the last few years for her chronic back and buttock pain. She's had prior lumbar laminectomy at L4-5 and L5-S1 with would always suspect is interesting's group placement of the pedicles particularly on the one side. She does have x-ray evidence of some sclerosis of the SI joints bilaterally. She has done well with sacroiliac joint injections over the last year intermittently. We last saw her in March of this year. Since I have seen her she has had a laparoscopic band procedure for weight loss reduction. She has lost a few pounds is continuing to work at that pretty diligently. I do think this can help her overall. As mentioned in our last note she has been using Lyrica but is felt like is not helping that much now. He was initiated Price at one point to we have help her out with that. She's had to come off some of her anti-inflammatories because of the laparoscopic band surgery. Today she is reporting pain across lower back and both buttock regions with the left side the worse lately. She gets some pain with standing and walking. She says sitting does not hurt and actually relieves the symptoms. She has no pain down the legs. Again she is off of her anti-inflammatories right now this is giving her some pain in general and she feels like the Lyrica really helps a little but does not help overall.    Review of Systems  Constitutional: Negative for chills, fever, malaise/fatigue and weight loss.  HENT: Negative for hearing loss and sinus pain.   Eyes: Negative for blurred vision, double vision and photophobia.  Respiratory: Negative for cough and shortness of breath.   Cardiovascular:  Negative for chest pain, palpitations and leg swelling.  Gastrointestinal: Negative for abdominal pain, nausea and vomiting.  Genitourinary: Negative for flank pain.  Musculoskeletal: Positive for back pain and joint pain. Negative for myalgias.  Skin: Negative for itching and rash.  Neurological: Negative for tremors, focal weakness and weakness.  Endo/Heme/Allergies: Negative.   Psychiatric/Behavioral: Negative for depression.  All other systems reviewed and are negative.  Otherwise per HPI.  Assessment & Plan: Visit Diagnoses:  1. Sacroiliitis ([place])   2. Post laminectomy syndrome   3. Chronic pain syndrome     Plan: Findings:  Chronic pain syndrome and chronic low back and bilateral buttock pain which we think is related to the sacroiliac joints. She's had prior lumbar fusion. Imaging of the lumbar spine has not shown any focal nerve compression. She has been morbidly obese but now has had laparoscopic band surgery and is trying to lose weight. She is taking Lyrica and we discussed weaning her off but at this point I would want to make too many changes. It may be too that once we weaned off the Lyrica to some degree she may realize that it is helping. I think her pain again is more the sacroiliac joints and she did well with prior injection. It may be worth having her see a rheumatologist at some point for evaluation and workup will see how she does with her weight loss surgery first. We are going to complete bilateral sacroiliac joint injections today. See how she  does overall.    Meds & Orders: No orders of the defined types were placed in this encounter.   Orders Placed This Encounter  Procedures  . Large Joint Injection/Arthrocentesis  . Large Joint Injection/Arthrocentesis  . XR C-ARM NO REPORT    Follow-up: Return if symptoms worsen or fail to improve.   Procedures: Left sacroiliac joint with fluoroscopic guidance Date/Time: 09/17/2016 8:58 AM Performed by: Magnus Sinning Authorized by: Magnus Sinning   Consent Given by:  Patient Site marked: the procedure site was marked   Timeout: prior to procedure the correct patient, procedure, and site was verified   Indications:  Pain and diagnostic evaluation Location:  Sacroiliac Site:  L sacroiliac joint Prep: patient was prepped and draped in usual sterile fashion   Needle Size:  22 G Needle Length:  3.5 inches Approach:  Posterior Ultrasound Guidance: No   Fluoroscopic Guidance: Yes   Arthrogram: No   Medications:  2 mL bupivacaine 0.5 %; 80 mg methylPREDNISolone acetate 80 MG/ML Aspiration Attempted: No   Patient tolerance:  Patient tolerated the procedure well with no immediate complications  There was excellent flow of contrast producing a partial arthrogram of the sacroiliac joint.  Right sacroiliac joint injection with fluoroscopic guidance Date/Time: 09/17/2016 8:59 AM Performed by: Magnus Sinning Authorized by: Magnus Sinning   Consent Given by:  Patient Site marked: the procedure site was marked   Timeout: prior to procedure the correct patient, procedure, and site was verified   Indications:  Pain and diagnostic evaluation Location:  Sacroiliac Site:  R sacroiliac joint Prep: patient was prepped and draped in usual sterile fashion   Needle Size:  22 G Needle Length:  3.5 inches Approach:  Posterior Ultrasound Guidance: No   Fluoroscopic Guidance: Yes   Arthrogram: No   Medications:  2 mL bupivacaine 0.5 %; 80 mg methylPREDNISolone acetate 80 MG/ML Aspiration Attempted: No   Patient tolerance:  Patient tolerated the procedure well with no immediate complications  There was excellent flow of contrast producing a partial arthrogram of the sacroiliac joint.     No notes on file   Clinical History: Lumbar spine MRI dated 06/11/2014 IMPRESSION: Interval fusion L3-4 L4-5. Posterior decompression at these levels without stenosis.  Progressive facet degeneration and mild  spinal stenosis at L2-3.  Progressive facet degeneration on the left at L5-S1 with left foraminal encroachment.   She reports that she has quit smoking. She has never used smokeless tobacco. No results for input(s): HGBA1C, LABURIC in the last 8760 hours.  Objective:  VS:  HT:    WT:   BMI:     BP:   HR: bpm  TEMP: ( )  RESP:  Physical Exam  Constitutional: She is oriented to person, place, and time. She appears well-developed and well-nourished.  Obese  Eyes: Pupils are equal, round, and reactive to light. Conjunctivae and EOM are normal.  Cardiovascular: Normal rate and intact distal pulses.   Pulmonary/Chest: Effort normal.  Musculoskeletal:  Patient ambulates without aid she has some pain with extension of the spine. She has a positive Fortin finger test bilaterally. She has pain with external rotation of the hips but not internal rotation and no groin pain. No pain over the greater trochanters and she has good distal strength.  Neurological: She is alert and oriented to person, place, and time. She exhibits normal muscle tone.  Skin: Skin is warm and dry. No rash noted. No erythema.  Psychiatric: She has a normal mood and affect. Her  behavior is normal.  Nursing note and vitals reviewed.   Ortho Exam Imaging: No results found.  Past Medical/Family/Surgical/Social History: Medications & Allergies reviewed per EMR Patient Active Problem List   Diagnosis Date Noted  . 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 Prediabetes 12/03/2015  . OSA on CPAP 02/19/2015  . Morbid obesity ([place]) 10/09/2014  . Pulmonary HTN ([place]) 05/12/2013  . Bilateral lower extremity edema 05/12/2013  . Morbid obesity with BMI of 50.0-59.9, adult ([place])   . Back pain, hx of prior back surgery 12/09/2011  . Pancreatitis, mild, CT negative 12/08/2011  . Fatty liver 12/08/2011  . Substernal chest pain, suspected secondary to acute pancreatitis 12/06/2011  . Hypertension 12/06/2011   . Hypokalemia 12/06/2011  . Hypothyroidism 12/06/2011  . GERD (gastroesophageal reflux disease) 12/06/2011  . Anxiety 12/06/2011  . BV (bacterial vaginosis)   . Hyperlipidemia    Past Medical History:  Diagnosis Date  . Anemia when she was young  . Arthritis   . Congestion of nasal sinus   . Constipation    takes Colace every other day  . DDD (degenerative disc disease), lumbar   . Diabetes mellitus without complication ([place])    per pt had been diagnosed but then "number started going down so my doctor said i didnt have diabetes"  . Dry eyes    uses Systane Eye drops daily as needed  . GERD (gastroesophageal reflux disease) 12/06/2011   takes Nexium daily  . Headache(784.0)    occasionally-d/t congestion  . History of bronchitis 6-7 yrs ago  . Hyperlipidemia   . Hypertension    takes Metoprolol and Diovan daily  . Hypothyroidism 12/06/2011   takes Synthroid daily  . Joint pain   . Multiple allergies    takes Zyrtec daily;uses Nasonex daily as needed  . Nocturia   . Numbness    weakness-right hand  . OSA on CPAP   . Pancreatitis   . Peripheral edema    takes Furosemide daily  . Shingles   . Urinary frequency   . Venous insufficiency of leg    Family History  Problem Relation Age of Onset  . Hypertension Mother   . Hypertension Father   . Hypertension Sister   . Kidney disease Sister        [facility] TRANSPLANT  . Thyroid disease Sister   . Thyroid disease Sister   . Hypertension Sister   . Breast cancer Maternal Aunt        >50; passed away from it  . Breast cancer Maternal Aunt        >50   Past Surgical History:  Procedure Laterality Date  . ABDOMINAL HYSTERECTOMY     uterine prolapse  . BACK SURGERY     fusion  . CARDIAC CATHETERIZATION  2007   NORMAL; catheter-induced spasm; per pat had this done d/t chest pain that was r/o as related to GERD, now on nexium denies any chest pain  . CHOLECYSTECTOMY    . COLONOSCOPY    . ESOPHAGOGASTRODUODENOSCOPY   5-61yrs ago  . FOOT SURGERY Bilateral   . LAPAROSCOPIC GASTRIC SLEEVE RESECTION N/A 08/23/2016   Procedure: LAPAROSCOPIC GASTRIC SLEEVE RESECTION WITH UPPER ENDO;  Surgeon: Johnathan Hausen, MD;  Location: [facility];  Service: General;  Laterality: N/A;  . SHOULDER ARTHROSCOPY WITH ROTATOR CUFF REPAIR AND SUBACROMIAL DECOMPRESSION Right 08/23/2013   Procedure: RIGHT SHOULDER ARTHROSCOPY WITH  SUBACROMIAL DECOMPRESSION DISTAL CLAVICLE RESECTION AND  ROTATOR CUFF REPAIR ;  Surgeon: Metta Clines  Supple, MD;  Location: [facility];  Service: Orthopedics;  Laterality: Right;  . TUBAL LIGATION     Social History   Occupational History  .  [employer]    [employer]   Social History Main Topics  . Smoking status: Former Research scientist (life sciences)  . Smokeless tobacco: Never Used     Comment: quit smoking in 1993  . Alcohol use 0.0 oz/week     Comment: rarely;; mixed drink   . Drug use: No  . Sexual activity: Yes    Birth control/ protection: Surgical

## 2016-09-17 NOTE — Progress Notes (Deleted)
Pain across lower back and into both buttocks. Left side is worse lately. Pain with walking and standing. Ok with sitting. Denies any radiating pain. Says she is hurting all over because she is not able to take anything for pain right now due to having gastric sleeve surgery on 08/23/16. Still taking Lyrica but states it does not help.

## 2016-09-20 ENCOUNTER — Other Ambulatory Visit (INDEPENDENT_AMBULATORY_CARE_PROVIDER_SITE_OTHER): Payer: Self-pay | Admitting: Physical Medicine and Rehabilitation

## 2016-09-20 DIAGNOSIS — M792 Neuralgia and neuritis, unspecified: Secondary | ICD-10-CM

## 2016-09-20 MED ORDER — BUPIVACAINE HCL 0.5 % IJ SOLN
2.0000 mL | INTRAMUSCULAR | Status: AC | PRN
  Administered 2016-09-17: 2 mL via INTRA_ARTICULAR

## 2016-09-20 MED ORDER — METHYLPREDNISOLONE ACETATE 80 MG/ML IJ SUSP
80.0000 mg | INTRAMUSCULAR | Status: AC | PRN
  Administered 2016-09-17: 80 mg via INTRA_ARTICULAR

## 2016-09-20 NOTE — Telephone Encounter (Signed)
Please advise 

## 2016-09-20 NOTE — Telephone Encounter (Signed)
Called in to patient's pharmacy.

## 2016-09-23 DIAGNOSIS — K219 Gastro-esophageal reflux disease without esophagitis: Secondary | ICD-10-CM | POA: Diagnosis not present

## 2016-09-23 DIAGNOSIS — Z9049 Acquired absence of other specified parts of digestive tract: Secondary | ICD-10-CM | POA: Diagnosis not present

## 2016-09-23 DIAGNOSIS — E119 Type 2 diabetes mellitus without complications: Secondary | ICD-10-CM | POA: Diagnosis not present

## 2016-09-23 DIAGNOSIS — E039 Hypothyroidism, unspecified: Secondary | ICD-10-CM | POA: Diagnosis not present

## 2016-09-23 DIAGNOSIS — I10 Essential (primary) hypertension: Secondary | ICD-10-CM | POA: Diagnosis not present

## 2016-09-23 DIAGNOSIS — Z6841 Body Mass Index (BMI) 40.0 and over, adult: Secondary | ICD-10-CM | POA: Diagnosis not present

## 2016-09-23 DIAGNOSIS — K5909 Other constipation: Secondary | ICD-10-CM | POA: Diagnosis not present

## 2016-09-30 DIAGNOSIS — Z0100 Encounter for examination of eyes and vision without abnormal findings: Secondary | ICD-10-CM | POA: Diagnosis not present

## 2016-10-06 ENCOUNTER — Ambulatory Visit (INDEPENDENT_AMBULATORY_CARE_PROVIDER_SITE_OTHER): Payer: 59 | Admitting: Neurology

## 2016-10-06 ENCOUNTER — Encounter: Payer: Self-pay | Admitting: Neurology

## 2016-10-06 VITALS — BP 144/81 | HR 67 | Ht 63.0 in | Wt 275.0 lb

## 2016-10-06 DIAGNOSIS — Z9989 Dependence on other enabling machines and devices: Secondary | ICD-10-CM

## 2016-10-06 DIAGNOSIS — Z9884 Bariatric surgery status: Secondary | ICD-10-CM

## 2016-10-06 DIAGNOSIS — G4733 Obstructive sleep apnea (adult) (pediatric): Secondary | ICD-10-CM | POA: Diagnosis not present

## 2016-10-06 NOTE — Patient Instructions (Signed)
Please continue using your CPAP regularly. While your insurance requires that you use CPAP at least 4 hours each night on 70% of the nights, I recommend, that you not skip any nights and use it throughout the night if you can. Getting used to CPAP and staying with the treatment long term does take time and patience and discipline. Untreated obstructive sleep apnea when it is moderate to severe can have an adverse impact on cardiovascular health and raise her risk for heart disease, arrhythmias, hypertension, congestive heart failure, stroke and diabetes. Untreated obstructive sleep apnea causes sleep disruption, nonrestorative sleep, and sleep deprivation. This can have an impact on your day to day functioning and cause daytime sleepiness and impairment of cognitive function, memory loss, mood disturbance, and problems focussing. Using CPAP regularly can improve these symptoms.  Keep up the good work! I will see you back in 1 year for sleep apnea check up, and if you continue to do well and continue to lose weight in this next year, we can consider re-testing you at some point, but I would wait at least a year.

## 2016-10-06 NOTE — Progress Notes (Signed)
Subjective:    Patient ID: [name] is a 56 y.o. female.  HPI     Interim history:   [name] is a 56 year old right-handed woman with an underlying medical history of hypertension, hyperlipidemia, diabetes, arthritis, hypothyroidism, reflux disease, history of pancreatitis, deemed secondary to medication, degenerative spine disease with low back pain, lower extremity edema and severe obesity, who presents for follow-up consultation of her sleep apnea, well established on CPAP therapy. The patient is unaccompanied today. I last saw her on 08/18/2015, at which time she was doing rather well on CPAP therapy and was fully compliant with it. She had undergone epidural steroid injections in the past for her back pain which were helpful.   Today, 10/06/2016 (all dictated new, as well as above notes, some dictation done in note pad or Word, outside of chart, may appear as copied):   I reviewed her CPAP compliance data from 09/05/2016 through 10/04/2016, which is a total of 30 days, during which time she used her CPAP every night with percent used days greater than 4 hours at 100%, indicating superb compliance with an average usage of 7 hours and 43 minutes, residual AHI low at 0.7 per hour, leak acceptable with the 95th percentile at 8.5 L/m on a pressure of 9 cm with EPR of 3. She reports doing well with CPAP. Of note, she recently underwent laparoscopic gastric sleeve bariatric surgery on 08/23/2016. She has lost some weight. Of note, at the time of her sleep study testing in December 2016 her weight was 280. She since then gained weight and has now lost weight.   The patient's allergies, current medications, family history, past medical history, past social history, past surgical history and problem list were reviewed and updated as appropriate.   Previously (copied from previous notes for reference):    I first met her on 12/04/2014 at the request of her primary care physician, at which  time she reported a family history of OSA, snoring and excessive daytime somnolence. I invited her back for sleep study. She had a split-night sleep study on 01/03/2015. I went over her test results with her in detail today. Baseline sleep efficiency was 68.7% with a latency to sleep of 18 minutes and wake after sleep onset of 45.5 minutes with severe sleep fragmentation noted. She had arousal index of 9 per hour. She had an increased percentage of stage 1 sleep, an increased percentage of slow-wave sleep and REM sleep of 3.6% with a prolonged REM latency. She had no significant PLMS, EKG or EEG changes. She had moderate to loud snoring. Total AHI was 30.1 per hour, average oxygen saturation 91%, nadir was 64%. She was then titrated on CPAP. Sleep efficiency was 72.4% during the second part of the study with sleep latency of 17.5 minutes and wake after sleep onset of 60 minutes with moderate sleep fragmentation noted. She had slow-wave sleep at 22.1%, REM sleep was 35.6%. Average oxygen saturation was 93%, nadir was 86%. CPAP was titrated from 5 cm to 9 cm. AHI was 1 per hour on the final pressure with supine REM sleep achieved. Based on her test results are prescribed CPAP therapy for home use at a pressure of 9 cm.    In the interim, she was seen by Cecille Rubin on 02/19/2015, at which time she was fully compliant with CPAP therapy and doing well.    I reviewed her CPAP compliance data from 07/15/2015 through 08/13/2015 which is a total of 30 days  during which time she used her machine every night with percent used days greater than 4 hours at 100%, indicating superb compliance with an average usage of 5 hours and 37 minutes, residual AHI 0.5 per hour, leak low with the 95th percentile at 3.4 L/m on a pressure of 9 cm with EPR of 3.    12/04/2014: She reports snoring and excessive daytime somnolence. I reviewed your office note from 11/12/2014, which you kindly included.  She went to the ER on 11/09/14,  after she woke up in the middle of the night a couple with SOB and a sense of choking. She has a family history of obstructive sleep apnea in her older sister who uses a CPAP machine. During the emergency room visit she was noted to have more lower extremity swelling. She was given IV Lasix 1 and her maintenance Lasix was increased to 30 mg from 20 mg daily. She had a chest x-ray and then a CT angiogram of her chest which showed: 1. No acute cardiopulmonary disease. Specifically, no evidence of pulmonary embolism to the level of the bilateral subsegmental pulmonary arteries. 2. Borderline cardiomegaly. 3. Coronary artery calcifications. 4. Indeterminate punctate (approximately 2 mm) right middle lobe pulmonary nodule. If the patient is at high risk for bronchogenic carcinoma, follow-up chest CT at 1 year is recommended. If the patient is at low risk, no follow-up is needed. She reports a bedtime of around 10 PM. She falls asleep quickly. She is separated. Currently her 35 year old son lives with her but he is moving out. She has a 40 year old daughter who is on her own. 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 Her rise time is 6:30 AM. She does not wake up rested. Her Epworth sleepiness score is 11 out of 24 today, her fatigue score is 37 out of 63. She has restless leg symptoms. These are intermittent. She has occasional leg cramps as well. She has nocturia, usually once or twice per night. She does not drink caffeine daily. She quit smoking in 1992. She drinks alcohol very rarely.    Her Past Medical History Is Significant For: Past Medical History:  Diagnosis Date  . Anemia when she was young  . Arthritis   . Congestion of nasal sinus   . Constipation    takes Colace every other day  . DDD (degenerative disc disease), lumbar   . Diabetes mellitus without complication ([place])    per pt had been diagnosed but then "number started going down so my doctor said i didnt have diabetes"  . Dry  eyes    uses Systane Eye drops daily as needed  . GERD (gastroesophageal reflux disease) 12/06/2011   takes Nexium daily  . Headache(784.0)    occasionally-d/t congestion  . History of bronchitis 6-7 yrs ago  . Hyperlipidemia   . Hypertension    takes Metoprolol and Diovan daily  . Hypothyroidism 12/06/2011   takes Synthroid daily  . Joint pain   . Multiple allergies    takes Zyrtec daily;uses Nasonex daily as needed  . Nocturia   . Numbness    weakness-right hand  . OSA on CPAP   . Pancreatitis   . Peripheral edema    takes Furosemide daily  . Shingles   . Urinary frequency   . Venous insufficiency of leg     Her Past Surgical History Is Significant For: Past Surgical History:  Procedure Laterality Date  . ABDOMINAL HYSTERECTOMY     uterine prolapse  .  BACK SURGERY     fusion  . CARDIAC CATHETERIZATION  2007   NORMAL; catheter-induced spasm; per pat had this done d/t chest pain that was r/o as related to GERD, now on nexium denies any chest pain  . CHOLECYSTECTOMY    . COLONOSCOPY    . ESOPHAGOGASTRODUODENOSCOPY  5-63yr ago  . FOOT SURGERY Bilateral   . LAPAROSCOPIC GASTRIC SLEEVE RESECTION N/A 08/23/2016   Procedure: LAPAROSCOPIC GASTRIC SLEEVE RESECTION WITH UPPER ENDO;  Surgeon: MJohnathan Hausen MD;  Location: [facility];  Service: General;  Laterality: N/A;  . SHOULDER ARTHROSCOPY WITH ROTATOR CUFF REPAIR AND SUBACROMIAL DECOMPRESSION Right 08/23/2013   Procedure: RIGHT SHOULDER ARTHROSCOPY WITH  SUBACROMIAL DECOMPRESSION DISTAL CLAVICLE RESECTION AND  ROTATOR CUFF REPAIR ;  Surgeon: KMarin Shutter MD;  Location: [facility]  Service: Orthopedics;  Laterality: Right;  . TUBAL LIGATION      Her Family History Is Significant For: Family History  Problem Relation Age of Onset  . Hypertension Mother   . Hypertension Father   . Hypertension Sister   . Kidney disease [facility]  . Thyroid disease Sister   . Thyroid disease Sister   . Hypertension  Sister   . Breast cancer Maternal Aunt        >50; passed away from it  . Breast cancer Maternal Aunt        >50    Her Social History Is Significant For: Social History   Social History  . Marital status: Divorced    Spouse name: N/A  . Number of children: 2  . Years of education: College   Occupational History  .  [employer]   Social History Main Topics  . Smoking status: Former SResearch scientist (life sciences) . Smokeless tobacco: Never Used     Comment: quit smoking in 1993  . Alcohol use 0.0 oz/week     Comment: rarely;; mixed drink   . Drug use: No  . Sexual activity: Yes    Birth control/ protection: Surgical   Other Topics Concern  . None   Social History Narrative   Caffeine: Coffee    Her Allergies Are:  Allergies  Allergen Reactions  . Metronidazole     Developed pancreatitis after taking this  . Adhesive [Tape] Itching and Rash  . Latex Itching and Rash  . Penicillins Rash    Has patient had a PCN reaction causing immediate rash, facial/tongue/throat swelling, SOB or lightheadedness with hypotension: No Has patient had a PCN reaction causing severe rash involving mucus membranes or skin necrosis: No Has patient had a PCN reaction that required hospitalization No Has patient had a PCN reaction occurring within the last 10 years: No If all of the above answers are "NO", then may proceed with Cephalosporin use.  .Marland KitchenVesicare [Solifenacin] Rash  :   Her Current Medications Are:  Outpatient Encounter Prescriptions as of 10/06/2016  Medication Sig  . albuterol (PROVENTIL HFA;VENTOLIN HFA) 108 (90 BASE) MCG/ACT inhaler Inhale 1-2 puffs into the lungs every 6 (six) hours as needed for wheezing or shortness of breath.  .Marland Kitchenaspirin EC 81 MG tablet Take 81 mg by mouth daily.   . Biotin 5000 MCG CAPS Take 5,000 mcg by mouth daily.  . diazepam (VALIUM) 5 MG tablet Take 0.5-1 tablets (2.5-5 mg total) by mouth every 6 (six) hours as needed for muscle spasms or sedation.  .  docusate sodium (COLACE) 100 MG capsule Take 100  mg by mouth daily.   Marland Kitchen esomeprazole (NEXIUM) 40 MG capsule Take 40 mg by mouth daily.   . fluticasone (FLONASE) 50 MCG/ACT nasal spray Place 1 spray into both nostrils daily as needed for allergies.   . furosemide (LASIX) 10 MG/ML solution Take 6 mLs (60 mg total) by mouth daily.  Marland Kitchen ibuprofen (ADVIL,MOTRIN) 200 MG tablet Take 600 mg by mouth 3 (three) times daily as needed for moderate pain.  Marland Kitchen irbesartan (AVAPRO) 150 MG tablet Take 1 tablet (150 mg total) by mouth daily.  Marland Kitchen levothyroxine (SYNTHROID, LEVOTHROID) 75 MCG tablet Take 75 mcg by mouth daily before breakfast.  . loratadine (CLARITIN) 10 MG tablet Take 10 mg by mouth daily.  Marland Kitchen LYRICA 150 MG capsule TAKE 1 CAPSULE BY MOUTH TWICE DAILY  . magnesium oxide (MAG-OX) 400 MG tablet Take 400 mg by mouth daily.  . metoprolol tartrate (LOPRESSOR) 25 MG tablet Take 25 mg by mouth 2 (two) times daily.  . Omega-3 Fatty Acids (FISH OIL) 1000 MG CAPS Take 1,000 mg by mouth daily.  . ondansetron (ZOFRAN ODT) 4 MG disintegrating tablet Take 1 tablet (4 mg total) by mouth every 8 (eight) hours as needed for nausea or vomiting.  Vladimir Faster Glycol-Propyl Glycol (SYSTANE OP) Place 1 drop into both eyes daily as needed (dry eyes).   . potassium chloride 20 MEQ/15ML (10%) SOLN Take 15 mLs (20 mEq total) by mouth daily.  . potassium chloride SA (KLOR-CON M20) 20 MEQ tablet Take 1 tablet (20 mEq total) by mouth daily.  . [DISCONTINUED] oxyCODONE (ROXICODONE) 5 MG/5ML solution Take 5-10 mLs (5-10 mg total) by mouth every 4 (four) hours as needed for moderate pain or severe pain.  . [DISCONTINUED] traMADol (ULTRAM) 50 MG tablet Take 1 tablet (50 mg total) by mouth every 6 (six) hours as needed. (Patient not taking: Reported on 07/02/2016)   No facility-administered encounter medications on file as of 10/06/2016.   :  Review of Systems:  Out of a complete 14 point review of systems, all are reviewed and negative  with the exception of these symptoms as listed below: Review of Systems  Neurological:       Pt presents today to discuss her cpap. Pt is using [facility]. Pt recently underwent bariatric surgery.    Objective:  Neurological Exam  Physical Exam Physical Examination:   Vitals:   10/06/16 1259  BP: (!) 144/81  Pulse: 67   General Examination: The patient is a very pleasant 56 y.o. female in no acute distress. She appears well-developed and well-nourished and well groomed.   HEENT: Normocephalic, atraumatic, pupils are equal, round and reactive to light and accommodation. Extraocular tracking is good without limitation to gaze excursion or nystagmus noted. Normal smooth pursuit is noted. Hearing is grossly intact. Face is symmetric with normal facial animation and normal facial sensation. Speech is clear with no dysarthria noted. There is no hypophonia. There is no lip, neck/head, jaw or voice tremor. Neck is supple with full range of passive and active motion. There are no carotid bruits on auscultation. Oropharynx exam reveals: moderate mouth dryness, and she has hyperpigmentation in her mouth, stable, adequate dental hygiene.   Chest: Clear to auscultation without wheezing, rhonchi or crackles noted.  Heart: S1+S2+0, regular and normal without murmurs, rubs or gallops noted.   Abdomen: Soft, non-tender and non-distended with normal bowel sounds appreciated on auscultation.  Extremities: There is trace pitting edema in the distal lower extremities around the ankles.   Skin: Warm and  dry without trophic changes noted. There are no varicose veins.  Musculoskeletal: exam reveals no obvious joint deformities, tenderness or joint swelling or erythema.   Neurologically:  Mental status: The patient is awake, alert and oriented in all 4 spheres. Her immediate and remote memory, attention, language skills and fund of knowledge are appropriate. There is no evidence of aphasia, agnosia, apraxia  or anomia. Speech is clear with normal prosody and enunciation. Thought process is linear. Mood is normal and affect is normal.  Cranial nerves II - XII are as described above under HEENT exam.  Motor exam: Normal bulk, strength and tone is noted. There is no drift, tremor or rebound. Romberg is negative. Reflexes are 1-2+ throughout. Fine motor skills and coordination: grossly intact.  Cerebellar testing: No dysmetria or intention tremor. There is no truncal or gait ataxia.  Sensory exam: intact to light touch in the upper and lower extremities.  Gait, station and balance: She stands easily. No veering to one side is noted. No leaning to one side is noted. Posture is age-appropriate and stance is narrow based. Gait shows normal stride length and normal pace. No problems turning are noted. Tandem walk is good today.    Assessment and Plan:   In summary, [name] is a very pleasant 56 year old female with an underlying medical history of hypertension, hyperlipidemia, diabetes, arthritis, hypothyroidism, reflux disease, history of pancreatitis, deemed secondary to medication, degenerative spine disease with low back pain with history of ESIs, lower extremity edema and severe obesity, who presents for follow-up consultation of her severe obstructive sleep apnea, on CPAP therapy at 9 cm with full compliance and ongoing good results. She recently underwent weight loss surgery. She has lost a little bit of weight thus far. Overall, compared to her sleep study from 01/03/2015 her net weight is not far off from where she was at the time. I encouraged her to continue to be fully compliant with her CPAP and she is commended for treatment adherence which is superb. I suggested a one-year recheck on her sleep apnea and compliance and if she has had significant weight loss by then we can certainly consider bringing her back at some point in the future for a repeat sleep study for comparison.  I again  explained in particular the risks and ramifications of untreated moderate to severe OSA, especially with respect to developing cardiovascular disease down the Road, including congestive heart failure, difficult to treat hypertension, cardiac arrhythmias, or stroke. Even type 2 diabetes has, in part, been linked to untreated OSA. Symptoms of untreated OSA include daytime sleepiness, memory problems, mood irritability and mood disorder such as depression and anxiety, lack of energy, as well as recurrent headaches, especially morning headaches. We talked about trying to maintain a healthy lifestyle in general, as well as the importance of weight control. She is congratulated on her weight loss endeavors. We will to a one-year recheck, I answered all her questions today and she was in agreement.  I spent 20 minutes in total face-to-face time with the patient, more than 50% of which was spent in counseling and coordination of care, reviewing test results, reviewing medication and discussing or reviewing the diagnosis of OSA, its prognosis and treatment options. Pertinent laboratory and imaging test results that were available during this visit with the patient were reviewed by me and considered in my medical decision making (see chart for details).

## 2016-10-08 DIAGNOSIS — R339 Retention of urine, unspecified: Secondary | ICD-10-CM | POA: Diagnosis not present

## 2016-10-08 DIAGNOSIS — G4733 Obstructive sleep apnea (adult) (pediatric): Secondary | ICD-10-CM | POA: Diagnosis not present

## 2016-10-19 ENCOUNTER — Encounter: Payer: Self-pay | Admitting: Skilled Nursing Facility1

## 2016-10-19 ENCOUNTER — Ambulatory Visit: Payer: Self-pay | Admitting: Registered"

## 2016-10-19 ENCOUNTER — Encounter: Payer: 59 | Attending: Surgery | Admitting: Skilled Nursing Facility1

## 2016-10-19 DIAGNOSIS — Z6841 Body Mass Index (BMI) 40.0 and over, adult: Secondary | ICD-10-CM | POA: Diagnosis not present

## 2016-10-19 DIAGNOSIS — Z713 Dietary counseling and surveillance: Secondary | ICD-10-CM | POA: Insufficient documentation

## 2016-10-19 NOTE — Progress Notes (Signed)
Follow-up visit:  8 Weeks Post-Operative Sleeve Surgery  Primary concerns today: Post-operative Bariatric Surgery Nutrition Management.  Pt states she no longer worries about diabetes and does not check. Pt states she was forgetting the multivitamin but now keeps them at the desk at work to remember them. Pt states she wants to try water aerobics. Pt asked about syrup in her coffee and really wanted them in her diet.   Surgery date: 08/23/2016 Surgery type: Sleeve gastrectomy Start weight at [facility]: 287 on 02/18/2016 (Pre-op class weight 309 lbs on 08/16/2016) Weight today: 286.4 Weight change: 10.4  TANITA  BODY COMP RESULTS  09/07/2016 10/19/2016   BMI (kg/m^2) 50.7 48.9   Fat Mass (lbs) 160.6 134.2   Fat Free Mass (lbs) 125.8 141.8   Total Body Water (lbs) 93.4 105.6    24-hr recall: B (AM): [place] sausage (21g) sometimes with 1 egg (28g) Snk (AM): sandwich meat (7-10g) or 2 ounces of cheese L (PM): pork chop or fish (21g) Snk (PM): cheese or lunch meat (7-10g) D (PM): steak (21g) Snk (PM): cheese and sugar free popcicle   Fluid intake: 1 protein shake sometimes, water with flavoring, sugar free lemonade, coffee with protein shake: 44 ounces  Estimated total protein intake: 80+  Medications: See List Supplementation: multivitamin and 2 calcium  Using straws: no Drinking while eating: no Having you been chewing well:no Chewing/swallowing difficulties: no Changes in vision: no Changes to mood/headaches: no Hair loss/Cahnges to skin/Changes to nails: no Any difficulty focusing or concentrating: no Sweating: no Dizziness/Lightheaded: no Palpitations: no  Carbonated beverages: no N/V/D/C/GAS: having a bowel movement every 3 days Abdominal Pain: no Dumping syndrome: no  Recent physical activity:  ADL's arthritis in her back  Progress Towards Goal(s):  In progress.  Handouts given during visit include:  Non-starchy vegetables + protein   Nutritional Diagnosis:   [place]-3.3 Overweight/obesity related to past poor dietary habits and physical inactivity as evidenced by patient w/ recent sleeve surgery following dietary guidelines for continued weight loss.    Intervention:  Nutrition counseling. Dietitian educated the pt on th eimportance of reaching her fluid needs and advancing her diet to include non-starchy vegetables Goals: -Aim for 3 of your cone water bottles a day: keep it at your desk, keep by you when your sitting on the couch at home, keep it in the car with you -Try the capsule multivitamin  -Always eat your protein first then start in on your vegetables: soft cooked first for 2 days then if no issues try raw like salad  -0g sugar/0g carbohydrate BBQ sauce is okay  Teaching Method Utilized:  Visual Auditory Hands on  Barriers to learning/adherence to lifestyle change: a want for sugar/unknow barrier for meeting fluid needs  Demonstrated degree of understanding via:  Teach Back   Monitoring/Evaluation:  Dietary intake, exercise, and body weight.

## 2016-10-19 NOTE — Patient Instructions (Addendum)
-  Aim for 3 of your cone water bottles a day: keep it at your desk, keep by you when your sitting on the couch at home, keep it in the car with you  -Try the capsule multivitamin   -Always eat your protein first then start in on your vegetables: soft cooked first for 2 days then if no issues try raw like salad   -0g sugar/0g carbohydrate BBQ sauce is okay

## 2016-10-26 DIAGNOSIS — M25561 Pain in right knee: Secondary | ICD-10-CM | POA: Diagnosis not present

## 2016-10-26 DIAGNOSIS — M25562 Pain in left knee: Secondary | ICD-10-CM | POA: Diagnosis not present

## 2016-10-26 DIAGNOSIS — M170 Bilateral primary osteoarthritis of knee: Secondary | ICD-10-CM | POA: Diagnosis not present

## 2016-11-03 ENCOUNTER — Other Ambulatory Visit: Payer: Self-pay | Admitting: Internal Medicine

## 2016-11-03 DIAGNOSIS — Z1231 Encounter for screening mammogram for malignant neoplasm of breast: Secondary | ICD-10-CM

## 2016-11-03 DIAGNOSIS — M25562 Pain in left knee: Secondary | ICD-10-CM | POA: Diagnosis not present

## 2016-11-03 DIAGNOSIS — M25561 Pain in right knee: Secondary | ICD-10-CM | POA: Diagnosis not present

## 2016-11-03 DIAGNOSIS — M170 Bilateral primary osteoarthritis of knee: Secondary | ICD-10-CM | POA: Diagnosis not present

## 2016-11-16 DIAGNOSIS — M25562 Pain in left knee: Secondary | ICD-10-CM | POA: Diagnosis not present

## 2016-11-16 DIAGNOSIS — M170 Bilateral primary osteoarthritis of knee: Secondary | ICD-10-CM | POA: Diagnosis not present

## 2016-11-16 DIAGNOSIS — M25561 Pain in right knee: Secondary | ICD-10-CM | POA: Diagnosis not present

## 2016-11-18 ENCOUNTER — Encounter: Payer: 59 | Attending: Surgery | Admitting: Skilled Nursing Facility1

## 2016-11-18 ENCOUNTER — Encounter: Payer: Self-pay | Admitting: Skilled Nursing Facility1

## 2016-11-18 DIAGNOSIS — Z713 Dietary counseling and surveillance: Secondary | ICD-10-CM | POA: Insufficient documentation

## 2016-11-18 DIAGNOSIS — E669 Obesity, unspecified: Secondary | ICD-10-CM

## 2016-11-18 DIAGNOSIS — Z6841 Body Mass Index (BMI) 40.0 and over, adult: Secondary | ICD-10-CM | POA: Insufficient documentation

## 2016-11-18 NOTE — Progress Notes (Signed)
Follow-up visit:  8 Weeks Post-Operative Sleeve Surgery  Primary concerns today: Post-operative Bariatric Surgery Nutrition Management. Pt states she is trying to get more fluid in. Pt admits to having fried chicken. Pt asked if she could have bread and then when dietitian educated the pt on what would happen if she added bread the pt said that makes since because I felt that when I ate it. Pt states she is disappointed with her weight loss: dietitian explained her food choices having a role in her weight loss. Pt states it is all just so hard.    Surgery date: 08/23/2016 Surgery type: Sleeve gastrectomy Start weight at [facility]: 287 on 02/18/2016 (Pre-op class weight 309 lbs on 08/16/2016) Weight today: 275.2 Weight change: 11.2  TANITA  BODY COMP RESULTS  09/07/2016 10/19/2016 11/18/2016   BMI (kg/m^2) 50.7 48.9 48.7   Fat Mass (lbs) 160.6 134.2 137   Fat Free Mass (lbs) 125.8 141.8 138.2   Total Body Water (lbs) 93.4 105.6 101.6    24-hr recall: B (AM): [place] sausage (21g) sometimes with 1 egg (28g) or deviled eggs with crackers  Snk (AM): meat and cheese L (12-1PM): pork chop or fish and salad or green beans (21g) Snk (3 PM): meat and cheese or just cheese D (PM): steak (21g) or fish with green beans  Snk (PM):   Fluid intake: water with flavoring, sugar free lemonade, coffee with protein shake: 44 ounces  Estimated total protein intake: 80+  Medications: See List Supplementation: multivitamin bariactiv and waiting for the calcium in the mail  Using straws: no Drinking while eating: no Having you been chewing well:no Chewing/swallowing difficulties: no Changes in vision: no Changes to mood/headaches: no Hair loss/Changes to skin/Changes to nails: no Any difficulty focusing or concentrating: no Sweating: no Dizziness/Lightheaded: no Palpitations: no  Carbonated beverages: no N/V/D/C/GAS: having a bowel movement every day or every other day, vomited with fried chicken   Abdominal Pain: no Dumping syndrome: no  Recent physical activity: 20-30 minutes on the treadmill sometimes   Progress Towards Goal(s):  In progress.  Handouts given during visit include:  Non-starchy vegetables + protein   Nutritional Diagnosis:  [place]-3.3 Overweight/obesity related to past poor dietary habits and physical inactivity as evidenced by patient w/ recent sleeve surgery following dietary guidelines for continued weight loss.    Intervention:  Nutrition counseling. Dietitian educated the pt on th eimportance of reaching her fluid needs and advancing her diet to include non-starchy vegetables Goals: -Start taking tums 2 hours apart while you are waiting for your calcium -If you want starchy vegetables like potato have protein and non-starchy vegetables too do NOT sacrifice the vegetables or the meat for the starchy stuff -Aim to have 3 bottles of water -Keep up the activity   Teaching Method Utilized:  Visual Auditory Hands on  Barriers to learning/adherence to lifestyle change: contemplative stage of change  Demonstrated degree of understanding via:  Teach Back   Monitoring/Evaluation:  Dietary intake, exercise, and body weight.

## 2016-11-18 NOTE — Patient Instructions (Addendum)
-  Start taking tums 2 hours apart while you are waiting for your calcium  -If you want starchy vegetables like potato have protein and non-starchy vegetables too do NOT sacrifice the vegetables or the meat for the starchy stuff  -Aim to have 3 bottles of water  -Keep up the activity

## 2016-11-22 ENCOUNTER — Ambulatory Visit
Admission: RE | Admit: 2016-11-22 | Discharge: 2016-11-22 | Disposition: A | Discharge status: Home / Self Care | Payer: 59 | Source: Ambulatory Visit | Attending: Internal Medicine | Admitting: Internal Medicine

## 2016-11-22 DIAGNOSIS — Z1231 Encounter for screening mammogram for malignant neoplasm of breast: Secondary | ICD-10-CM

## 2016-12-07 DIAGNOSIS — E119 Type 2 diabetes mellitus without complications: Secondary | ICD-10-CM | POA: Diagnosis not present

## 2016-12-07 DIAGNOSIS — M25562 Pain in left knee: Secondary | ICD-10-CM | POA: Diagnosis not present

## 2016-12-07 DIAGNOSIS — E7849 Other hyperlipidemia: Secondary | ICD-10-CM | POA: Diagnosis not present

## 2016-12-07 DIAGNOSIS — R918 Other nonspecific abnormal finding of lung field: Secondary | ICD-10-CM | POA: Diagnosis not present

## 2016-12-07 DIAGNOSIS — R600 Localized edema: Secondary | ICD-10-CM | POA: Diagnosis not present

## 2016-12-07 DIAGNOSIS — I10 Essential (primary) hypertension: Secondary | ICD-10-CM | POA: Diagnosis not present

## 2016-12-07 DIAGNOSIS — Z6841 Body Mass Index (BMI) 40.0 and over, adult: Secondary | ICD-10-CM | POA: Diagnosis not present

## 2016-12-07 DIAGNOSIS — Z9884 Bariatric surgery status: Secondary | ICD-10-CM | POA: Diagnosis not present

## 2016-12-20 ENCOUNTER — Other Ambulatory Visit (INDEPENDENT_AMBULATORY_CARE_PROVIDER_SITE_OTHER): Payer: Self-pay | Admitting: Physical Medicine and Rehabilitation

## 2016-12-20 DIAGNOSIS — M792 Neuralgia and neuritis, unspecified: Secondary | ICD-10-CM

## 2016-12-20 NOTE — Telephone Encounter (Signed)
Please advise 

## 2016-12-21 NOTE — Telephone Encounter (Signed)
Faxed to pharmacy

## 2016-12-26 ENCOUNTER — Ambulatory Visit (HOSPITAL_COMMUNITY)
Admission: EM | Admit: 2016-12-26 | Discharge: 2016-12-26 | Disposition: A | Discharge status: Home / Self Care | Payer: 59

## 2016-12-26 ENCOUNTER — Encounter (HOSPITAL_COMMUNITY): Payer: Self-pay | Admitting: Emergency Medicine

## 2016-12-26 DIAGNOSIS — M1712 Unilateral primary osteoarthritis, left knee: Secondary | ICD-10-CM | POA: Diagnosis not present

## 2016-12-26 NOTE — ED Triage Notes (Signed)
PT C/O: left knee pain .... Noticed after sitting at church for a while  ONSET: today  SX ALSO INCLUDE: swelling... Pain increases w/activity  DENIES: inj/trauma  TAKING MEDS: Baclofen w/no relief.   A&O x4... NAD... Ambulatory

## 2016-12-26 NOTE — Discharge Instructions (Signed)
Can continue taking Mobic when necessary.  Dr. Durward Fortes  will discuss further treatment options for your left knee.

## 2016-12-26 NOTE — ED Provider Notes (Signed)
[facility]    CSN: [id number] Arrival date & time: 12/26/16  1841     History   Chief Complaint Chief Complaint  Patient presents with  . Knee Pain    HPI [name] is a 56 y.o. female.   HPI Patient in today with complaints of left knee pain. She has known history of end-stage DJD left knee and is followed by Dr. Joni Fears orthopedic surgeon for this. Last seen in his office June 2018 and had left knee interarticular Marcaine/Depo-Medrol injection performed. Patient states that she's been told that we'll also may come down her needing total knee replacement but she has been hoping to put this off for as long as possible. She decided to go to a local [facility] clinic to have injections there. Has not noticed any improvement. Pain increased today after she was sitting in church. No injury. Pain with ambulating and getting up from a seated position. Also has problems with her right knee but not as bad as the left. Past Medical History:  Diagnosis Date  . Anemia when she was young  . Arthritis   . Congestion of nasal sinus   . Constipation    takes Colace every other day  . DDD (degenerative disc disease), lumbar   . Diabetes mellitus without complication ([place])    per pt had been diagnosed but then "number started going down so my doctor said i didnt have diabetes"  . Dry eyes    uses Systane Eye drops daily as needed  . GERD (gastroesophageal reflux disease) 12/06/2011   takes Nexium daily  . Headache(784.0)    occasionally-d/t congestion  . History of bronchitis 6-7 yrs ago  . Hyperlipidemia   . Hypertension    takes Metoprolol and Diovan daily  . Hypothyroidism 12/06/2011   takes Synthroid daily  . Joint pain   . Multiple allergies    takes Zyrtec daily;uses Nasonex daily as needed  . Nocturia   . Numbness    weakness-right hand  . OSA on CPAP   . Pancreatitis   . Peripheral edema    takes Furosemide daily  . Shingles   . Urinary  frequency   . Venous insufficiency of leg     Patient Active Problem List   Diagnosis Date Noted  . 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 Prediabetes 12/03/2015  . OSA on CPAP 02/19/2015  . Morbid obesity ([place]) 10/09/2014  . Pulmonary HTN ([place]) 05/12/2013  . Bilateral lower extremity edema 05/12/2013  . Morbid obesity with BMI of 50.0-59.9, adult ([place])   . Back pain, hx of prior back surgery 12/09/2011  . Pancreatitis, mild, CT negative 12/08/2011  . Fatty liver 12/08/2011  . Substernal chest pain, suspected secondary to acute pancreatitis 12/06/2011  . Hypertension 12/06/2011  . Hypokalemia 12/06/2011  . Hypothyroidism 12/06/2011  . GERD (gastroesophageal reflux disease) 12/06/2011  . Anxiety 12/06/2011  . BV (bacterial vaginosis)   . Hyperlipidemia     Past Surgical History:  Procedure Laterality Date  . ABDOMINAL HYSTERECTOMY     uterine prolapse  . BACK SURGERY     fusion  . CARDIAC CATHETERIZATION  2007   NORMAL; catheter-induced spasm; per pat had this done d/t chest pain that was r/o as related to GERD, now on nexium denies any chest pain  . CHOLECYSTECTOMY    . COLONOSCOPY    . ESOPHAGOGASTRODUODENOSCOPY  5-6yrs ago  . FOOT SURGERY Bilateral   . LAPAROSCOPIC GASTRIC SLEEVE RESECTION  N/A 08/23/2016   Procedure: LAPAROSCOPIC GASTRIC SLEEVE RESECTION WITH UPPER ENDO;  Surgeon: Johnathan Hausen, MD;  Location: [facility];  Service: General;  Laterality: N/A;  . SHOULDER ARTHROSCOPY WITH ROTATOR CUFF REPAIR AND SUBACROMIAL DECOMPRESSION Right 08/23/2013   Procedure: RIGHT SHOULDER ARTHROSCOPY WITH  SUBACROMIAL DECOMPRESSION DISTAL CLAVICLE RESECTION AND  ROTATOR CUFF REPAIR ;  Surgeon: Marin Shutter, MD;  Location: [facility];  Service: Orthopedics;  Laterality: Right;  . TUBAL LIGATION      OB History    Gravida Para Term Preterm AB Living   2 2       2    SAB TAB Ectopic Multiple Live Births                   Home Medications    Prior to  Admission medications   Medication Sig Start Date End Date Taking? Authorizing Provider  aspirin EC 81 MG tablet Take 81 mg by mouth daily.    Yes [provider]  Biotin 5000 MCG CAPS Take 5,000 mcg by mouth daily.   Yes [provider]  docusate sodium (COLACE) 100 MG capsule Take 100 mg by mouth daily.    Yes [provider]  esomeprazole (NEXIUM) 40 MG capsule Take 40 mg by mouth daily.    Yes [provider]  fluticasone (FLONASE) 50 MCG/ACT nasal spray Place 1 spray into both nostrils daily as needed for allergies.  01/06/16  Yes [provider]  furosemide (LASIX) 10 MG/ML solution Take 6 mLs (60 mg total) by mouth daily. 08/20/16  Yes Croitoru, Mihai, MD  irbesartan (AVAPRO) 150 MG tablet Take 1 tablet (150 mg total) by mouth daily. 08/26/16  Yes Croitoru, Mihai, MD  levothyroxine (SYNTHROID, LEVOTHROID) 75 MCG tablet Take 75 mcg by mouth daily before breakfast.   Yes [provider]  loratadine (CLARITIN) 10 MG tablet Take 10 mg by mouth daily.   Yes [provider]  LYRICA 150 MG capsule TAKE ONE CAPSULE BY MOUTH 2 TIMES A DAY 12/20/16  Yes Magnus Sinning, MD  magnesium oxide (MAG-OX) 400 MG tablet Take 400 mg by mouth daily.   Yes [provider]  metoprolol tartrate (LOPRESSOR) 25 MG tablet Take 25 mg by mouth 2 (two) times daily.   Yes [provider]  Omega-3 Fatty Acids (FISH OIL) 1000 MG CAPS Take 1,000 mg by mouth daily.   Yes [provider]  Polyethyl Glycol-Propyl Glycol (SYSTANE OP) Place 1 drop into both eyes daily as needed (dry eyes).    Yes [provider]  albuterol (PROVENTIL HFA;VENTOLIN HFA) 108 (90 BASE) MCG/ACT inhaler Inhale 1-2 puffs into the lungs every 6 (six) hours as needed for wheezing or shortness of breath. 10/09/14   Croitoru, Mihai, MD  diazepam (VALIUM) 5 MG tablet Take 0.5-1 tablets (2.5-5 mg total) by mouth every 6 (six) hours as needed for muscle spasms or  sedation. 08/23/13   Shuford, Olivia Mackie, PA-C  ibuprofen (ADVIL,MOTRIN) 200 MG tablet Take 600 mg by mouth 3 (three) times daily as needed for moderate pain.    [provider]  ondansetron (ZOFRAN ODT) 4 MG disintegrating tablet Take 1 tablet (4 mg total) by mouth every 8 (eight) hours as needed for nausea or vomiting. 08/25/16   Focht, Fraser Din, PA  potassium chloride 20 MEQ/15ML (10%) SOLN Take 15 mLs (20 mEq total) by mouth daily. 08/18/16   Croitoru, Mihai, MD  potassium chloride SA (KLOR-CON M20) 20 MEQ tablet Take 1 tablet (20  mEq total) by mouth daily. 02/04/16 08/17/16  Croitoru, Dani Gobble, MD    Family History Family History  Problem Relation Age of Onset  . Hypertension Mother   . Hypertension Father   . Hypertension Sister   . Kidney disease Sister        [facility] TRANSPLANT  . Thyroid disease Sister   . Thyroid disease Sister   . Hypertension Sister   . Breast cancer Maternal Aunt        >50; passed away from it  . Breast cancer Maternal Aunt        >50    Social History Social History   Tobacco Use  . Smoking status: Former Research scientist (life sciences)  . Smokeless tobacco: Never Used  . Tobacco comment: quit smoking in 1993  Substance Use Topics  . Alcohol use: Yes    Alcohol/week: 0.0 oz    Comment: rarely;; mixed drink   . Drug use: No     Allergies   Metronidazole; Adhesive [tape]; Latex; Penicillins; and Vesicare [solifenacin]   Review of Systems Review of Systems  Constitutional: Positive for activity change.  Respiratory: Negative.   Gastrointestinal: Negative.   Musculoskeletal: Positive for gait problem and joint swelling.  Skin: Negative.   Psychiatric/Behavioral: Negative.      Physical Exam Triage Vital Signs ED Triage Vitals  Enc Vitals Group     BP 12/26/16 1856 (!) 141/83     Pulse Rate 12/26/16 1856 67     Resp 12/26/16 1856 20     Temp 12/26/16 1856 98.1 F (36.7 C)     Temp Source 12/26/16 1856 Oral     SpO2 12/26/16 1856 95 %     Weight --       Height --      Head Circumference --      Peak Flow --      Pain Score 12/26/16 1857 10     Pain Loc --      Pain Edu? --      Excl. in [place]? --    No data found.  Updated Vital Signs BP (!) 141/83 (BP Location: Left Arm)   Pulse 67   Temp 98.1 F (36.7 C) (Oral)   Resp 20   SpO2 95%   Visual Acuity Right Eye Distance:   Left Eye Distance:   Bilateral Distance:    Right Eye Near:   Left Eye Near:    Bilateral Near:     Physical Exam  Constitutional: No distress.  HENT:  Head: Normocephalic and atraumatic.  Eyes: Pupils are equal, round, and reactive to light.  Pulmonary/Chest: No respiratory distress.  Musculoskeletal:  Left knee positive crepitus. Positive effusion. Joint line tender. Calf nontender.  Neurological: She is alert.  Skin: Skin is warm and dry.  Psychiatric: She has a normal mood and affect.     UC Treatments / Results  Labs (all labs ordered are listed, but only abnormal results are displayed) Labs Reviewed - No data to display  EKG  EKG Interpretation None       Radiology No results found.  Procedures Procedures (including critical care time)  Medications Ordered in UC Medications - No data to display   Initial Impression / Assessment and Plan / UC Course  I have reviewed the triage vital signs and the nursing notes.  Pertinent labs & imaging results that were available during my care of the patient were reviewed by me and considered in my medical decision making (see chart for  details).       Final Clinical Impressions(s) / UC Diagnoses   Final diagnoses:  Arthritis of left knee    ED Discharge Orders    None    Advised patient that at this point and limited as to what I have to offer here at the urgent care.  I reviewed left knee x-rays advised patient that definitive treatment is obviously total knee replacement. She is ready failed conservative treatment with multiple injection therapy and oral medication in the past.  Advised patient that she would need preoperative clearance. Does not have much help at home so she would likely need a short skilled nursing facility placement. Advised her to call Dr. Rudene Anda office tomorrow to schedule an appointment to be seen this week.   Controlled Substance Prescriptions [place] Controlled Substance Registry consulted? Not Applicable   Lanae Crumbly, PA-C 12/26/16 1939

## 2016-12-27 ENCOUNTER — Ambulatory Visit (INDEPENDENT_AMBULATORY_CARE_PROVIDER_SITE_OTHER): Payer: 59 | Admitting: Orthopaedic Surgery

## 2016-12-27 ENCOUNTER — Encounter (INDEPENDENT_AMBULATORY_CARE_PROVIDER_SITE_OTHER): Payer: Self-pay | Admitting: Orthopaedic Surgery

## 2016-12-27 ENCOUNTER — Telehealth (INDEPENDENT_AMBULATORY_CARE_PROVIDER_SITE_OTHER): Payer: Self-pay | Admitting: Orthopaedic Surgery

## 2016-12-27 VITALS — BP 153/82 | HR 85 | Resp 18 | Ht 63.0 in | Wt 274.0 lb

## 2016-12-27 DIAGNOSIS — M170 Bilateral primary osteoarthritis of knee: Secondary | ICD-10-CM | POA: Diagnosis not present

## 2016-12-27 MED ORDER — BUPIVACAINE HCL 0.5 % IJ SOLN
2.0000 mL | INTRAMUSCULAR | Status: AC | PRN
  Administered 2016-12-27: 2 mL via INTRA_ARTICULAR

## 2016-12-27 MED ORDER — LIDOCAINE HCL 1 % IJ SOLN
2.0000 mL | INTRAMUSCULAR | Status: AC | PRN
  Administered 2016-12-27: 2 mL

## 2016-12-27 MED ORDER — METHYLPREDNISOLONE ACETATE 40 MG/ML IJ SUSP
80.0000 mg | INTRAMUSCULAR | Status: AC | PRN
  Administered 2016-12-27: 80 mg

## 2016-12-27 NOTE — Telephone Encounter (Signed)
Any room today

## 2016-12-27 NOTE — Telephone Encounter (Signed)
Patient called this morning wanting to see Dr. Durward Fortes today.  She stated that her left knee went out on her at church yesterday.  CB#[phone].  Thank you.

## 2016-12-27 NOTE — Progress Notes (Signed)
Office Visit Note   Patient: [name]           Date of Birth: 01/17/1961           MRN: [id number] Visit Date: 12/27/2016              Requested by: Velna Hatchet, MD [address] [place], [place] [address] PCP: Velna Hatchet, MD   Assessment & Plan: Visit Diagnoses:  1. Bilateral primary osteoarthritis of knee     Plan: Cortisone injection left knee. Return to to 4 weeks to consider injecting right knee. Long discussion regarding her osteoarthritis and what she may expect over time. Discussion of total knee replacement in future. Needs to lose more weight and exercise.  Follow-Up Instructions: Return if symptoms worsen or fail to improve.   Orders:  No orders of the defined types were placed in this encounter.  No orders of the defined types were placed in this encounter.     Procedures: Large Joint Inj: L knee on 12/27/2016 11:56 AM Indications: pain and diagnostic evaluation Details: 25 G 1.5 in needle, anteromedial approach  Arthrogram: No  Medications: 2 mL lidocaine 1 %; 2 mL bupivacaine 0.5 %; 80 mg methylPREDNISolone acetate 40 MG/ML Procedure, treatment alternatives, risks and benefits explained, specific risks discussed. Consent was given by the patient. Patient was prepped and draped in the usual sterile fashion.       Clinical Data: No additional findings.   Subjective: Chief Complaint  Patient presents with  . Left Knee - Pain  . Knee Pain    Knee pain x years, needs TKR, discuss surgery, Urgent Care 12/26/16, swellling, difficulty walking, difficulty sleeping at night, popping, Advil doesn't help  Films in May 2018 reveal advanced osteoarthritis in both knees predominantly the medial compartment with increased varus. Cortisone  injections have helped in the past. She recently was evaluated at [facility] with Visco supplementation. She relates minimal relief. In July had a gastric sleeve procedure and has lost 40 pounds. Still relatively  inactive and not exercising.  HPI  Review of Systems   Objective: Vital Signs: BP (!) 153/82 (BP Location: Left Arm, Patient Position: Sitting, Cuff Size: Normal)   Pulse 85   Resp 18   Ht 5\' 3"  (1.6 m)   Wt 274 lb (124.3 kg)   BMI 48.54 kg/m   Physical Exam  Ortho Exam examined in wheelchair. Awake alert and oriented 3 comfortable sitting. Pain today predominantly in her left knee. Mostly medial joint pain. Large knees. Difficult to know if there was an effusion. Full extension and about 95 of flexion without instability. Increased varus. Neurovascular intact distally. No swelling.  Specialty Comments:  No specialty comments available.  Imaging: No results found.   PMFS History: Patient Active Problem List   Diagnosis Date Noted  . Bilateral primary osteoarthritis of knee 12/27/2016  . 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 Prediabetes 12/03/2015  . OSA on CPAP 02/19/2015  . Morbid obesity ([place]) 10/09/2014  . Pulmonary HTN ([place]) 05/12/2013  . Bilateral lower extremity edema 05/12/2013  . Morbid obesity with BMI of 50.0-59.9, adult ([place])   . Back pain, hx of prior back surgery 12/09/2011  . Pancreatitis, mild, CT negative 12/08/2011  . Fatty liver 12/08/2011  . Substernal chest pain, suspected secondary to acute pancreatitis 12/06/2011  . Hypertension 12/06/2011  . Hypokalemia 12/06/2011  . Hypothyroidism 12/06/2011  . GERD (gastroesophageal reflux disease) 12/06/2011  . Anxiety 12/06/2011  . BV (bacterial vaginosis)   .  Hyperlipidemia    Past Medical History:  Diagnosis Date  . Anemia when she was young  . Arthritis   . Congestion of nasal sinus   . Constipation    takes Colace every other day  . DDD (degenerative disc disease), lumbar   . Diabetes mellitus without complication ([place])    per pt had been diagnosed but then "number started going down so my doctor said i didnt have diabetes"  . Dry eyes    uses Systane Eye drops daily  as needed  . GERD (gastroesophageal reflux disease) 12/06/2011   takes Nexium daily  . Headache(784.0)    occasionally-d/t congestion  . History of bronchitis 6-7 yrs ago  . Hyperlipidemia   . Hypertension    takes Metoprolol and Diovan daily  . Hypothyroidism 12/06/2011   takes Synthroid daily  . Joint pain   . Multiple allergies    takes Zyrtec daily;uses Nasonex daily as needed  . Nocturia   . Numbness    weakness-right hand  . OSA on CPAP   . Pancreatitis   . Peripheral edema    takes Furosemide daily  . Shingles   . Urinary frequency   . Venous insufficiency of leg     Family History  Problem Relation Age of Onset  . Hypertension Mother   . Hypertension Father   . Hypertension Sister   . Kidney disease Sister        [facility] TRANSPLANT  . Thyroid disease Sister   . Thyroid disease Sister   . Hypertension Sister   . Breast cancer Maternal Aunt        >50; passed away from it  . Breast cancer Maternal Aunt        >50    Past Surgical History:  Procedure Laterality Date  . ABDOMINAL HYSTERECTOMY     uterine prolapse  . BACK SURGERY     fusion  . CARDIAC CATHETERIZATION  2007   NORMAL; catheter-induced spasm; per pat had this done d/t chest pain that was r/o as related to GERD, now on nexium denies any chest pain  . CHOLECYSTECTOMY    . COLONOSCOPY    . ESOPHAGOGASTRODUODENOSCOPY  5-11yrs ago  . FOOT SURGERY Bilateral   . LAPAROSCOPIC GASTRIC SLEEVE RESECTION N/A 08/23/2016   Procedure: LAPAROSCOPIC GASTRIC SLEEVE RESECTION WITH UPPER ENDO;  Surgeon: Johnathan Hausen, MD;  Location: [facility];  Service: General;  Laterality: N/A;  . SHOULDER ARTHROSCOPY WITH ROTATOR CUFF REPAIR AND SUBACROMIAL DECOMPRESSION Right 08/23/2013   Procedure: RIGHT SHOULDER ARTHROSCOPY WITH  SUBACROMIAL DECOMPRESSION DISTAL CLAVICLE RESECTION AND  ROTATOR CUFF REPAIR ;  Surgeon: Marin Shutter, MD;  Location: [facility];  Service: Orthopedics;  Laterality: Right;  . TUBAL LIGATION     Social  History   Occupational History    Employer: [employer]    Comment: [employer]  Tobacco Use  . Smoking status: Former Smoker    Packs/day: 0.75    Years: 40.00    Pack years: 30.00    Types: Cigarettes    Last attempt to quit: 1993    Years since quitting: 25.9  . Smokeless tobacco: Never Used  . Tobacco comment: quit smoking in 1993  Substance and Sexual Activity  . Alcohol use: Yes    Alcohol/week: 0.0 oz    Comment: rarely;; mixed drink   . Drug use: No  . Sexual activity: Yes    Birth control/protection: Surgical     Garald Balding, MD   Note -  This record has been created using [employer].  Chart creation errors have been sought, but may not always  have been located. Such creation errors do not reflect on  the standard of medical care.

## 2017-01-10 DIAGNOSIS — G4733 Obstructive sleep apnea (adult) (pediatric): Secondary | ICD-10-CM | POA: Diagnosis not present

## 2017-01-10 DIAGNOSIS — R339 Retention of urine, unspecified: Secondary | ICD-10-CM | POA: Diagnosis not present

## 2017-01-14 DIAGNOSIS — Z9884 Bariatric surgery status: Secondary | ICD-10-CM | POA: Diagnosis not present

## 2017-01-20 ENCOUNTER — Encounter: Payer: Self-pay | Admitting: Skilled Nursing Facility1

## 2017-01-20 ENCOUNTER — Encounter: Payer: 59 | Attending: Surgery | Admitting: Skilled Nursing Facility1

## 2017-01-20 DIAGNOSIS — Z713 Dietary counseling and surveillance: Secondary | ICD-10-CM | POA: Insufficient documentation

## 2017-01-20 DIAGNOSIS — E119 Type 2 diabetes mellitus without complications: Secondary | ICD-10-CM

## 2017-01-20 DIAGNOSIS — Z6841 Body Mass Index (BMI) 40.0 and over, adult: Secondary | ICD-10-CM | POA: Insufficient documentation

## 2017-01-20 NOTE — Progress Notes (Signed)
Sleeve Surgery  Primary concerns today: Post-operative Bariatric Surgery Nutrition Management.  Pt states she feels like shes eating too many carbohydrates and wants to eat more vegetables. Pt states she is struggling with putting too much food on her plate. Pt states she is getting in 2 bottles of water. (32oz total) Pt states she does not need a follow up she is fine.   Surgery date: 08/23/2016 Surgery type: Sleeve gastrectomy Start weight at [facility]: 287 on 02/18/2016 (Pre-op class weight 309 lbs on 08/16/2016) Weight today: 275.2  TANITA  BODY COMP RESULTS  09/07/2016 10/19/2016 11/18/2016   BMI (kg/m^2) 50.7 48.9 48.7   Fat Mass (lbs) 160.6 134.2 137   Fat Free Mass (lbs) 125.8 141.8 138.2   Total Body Water (lbs) 93.4 105.6 101.6    24-hr recall: stating she has cheated a lot with sweets and other holiday foods B (AM): [place] sausage (21g) sometimes with 1 egg (28g) or deviled eggs with crackers  Snk (AM): meat and cheese or cheese its L (12-1PM): pork chop or fish and salad or green beans (21g) or grilled chicken and potatoes  Snk (3 PM): meat and cheese or just cheese D (PM): steak (21g) or fish with green beans  Snk (PM):   Fluid intake: 3 bottles of water, regular coffee Estimated total protein intake: 80+  Medications: See List Supplementation: multivitamin bariactiv and waiting for the calcium in the mail  Using straws: no Drinking while eating: no Having you been chewing well:no Chewing/swallowing difficulties: no Changes in vision: no Changes to mood/headaches: no Hair loss/Changes to skin/Changes to nails: no Any difficulty focusing or concentrating: no Sweating: no Dizziness/Lightheaded: no Palpitations: no  Carbonated beverages: no N/V/D/C/GAS: having a bowel movement every day or every other day, vomited with fried chicken  Abdominal Pain: no Dumping syndrome: no  Recent physical activity: 20-30 minutes on the treadmill sometimes   Progress Towards  Goal(s):  In progress.  Handouts given during visit include:  Non-starchy vegetables + protein   Nutritional Diagnosis:  [place]-3.3 Overweight/obesity related to past poor dietary habits and physical inactivity as evidenced by patient w/ recent sleeve surgery following dietary guidelines for continued weight loss.    Intervention:  Nutrition counseling. Dietitian educated the pt on th eimportance of reaching her fluid needs and advancing her diet to include non-starchy vegetables Goals: -"I can have more later" -Use a small plate for your food  -Chew until applesauce consistency  -Aim to have 3 of your water bottles a day   Teaching Method Utilized:  Visual Auditory Hands on  Barriers to learning/adherence to lifestyle change: contemplative stage of change  Demonstrated degree of understanding via:  Teach Back   Monitoring/Evaluation:  Dietary intake, exercise, and body weight.

## 2017-01-20 NOTE — Patient Instructions (Signed)
-  I can have more later  -Use a small plate for your food   -Chew until applesauce consistency   -Aim to have 3 of your water bottles a day

## 2017-01-21 ENCOUNTER — Telehealth (INDEPENDENT_AMBULATORY_CARE_PROVIDER_SITE_OTHER): Payer: Self-pay | Admitting: Orthopaedic Surgery

## 2017-01-21 NOTE — Telephone Encounter (Signed)
Patient called to request a prescription refill for Ibuprofen (200mg  tab).  Pharmacy is [facility].   CB# [phone]

## 2017-01-27 ENCOUNTER — Ambulatory Visit (INDEPENDENT_AMBULATORY_CARE_PROVIDER_SITE_OTHER): Payer: 59 | Admitting: Orthopaedic Surgery

## 2017-01-27 ENCOUNTER — Ambulatory Visit (INDEPENDENT_AMBULATORY_CARE_PROVIDER_SITE_OTHER): Payer: Self-pay

## 2017-01-27 ENCOUNTER — Encounter (INDEPENDENT_AMBULATORY_CARE_PROVIDER_SITE_OTHER): Payer: Self-pay

## 2017-01-27 ENCOUNTER — Encounter (INDEPENDENT_AMBULATORY_CARE_PROVIDER_SITE_OTHER): Payer: Self-pay | Admitting: Orthopaedic Surgery

## 2017-01-27 ENCOUNTER — Ambulatory Visit (INDEPENDENT_AMBULATORY_CARE_PROVIDER_SITE_OTHER): Payer: 59

## 2017-01-27 VITALS — Resp 16 | Ht 63.0 in | Wt 275.0 lb

## 2017-01-27 DIAGNOSIS — G8929 Other chronic pain: Secondary | ICD-10-CM

## 2017-01-27 DIAGNOSIS — M1712 Unilateral primary osteoarthritis, left knee: Secondary | ICD-10-CM | POA: Diagnosis not present

## 2017-01-27 DIAGNOSIS — M25561 Pain in right knee: Secondary | ICD-10-CM | POA: Diagnosis not present

## 2017-01-27 DIAGNOSIS — M25562 Pain in left knee: Secondary | ICD-10-CM | POA: Diagnosis not present

## 2017-01-27 MED ORDER — LIDOCAINE HCL 1 % IJ SOLN
2.0000 mL | INTRAMUSCULAR | Status: AC | PRN
  Administered 2017-01-27: 2 mL

## 2017-01-27 MED ORDER — BUPIVACAINE HCL 0.5 % IJ SOLN
2.0000 mL | INTRAMUSCULAR | Status: AC | PRN
  Administered 2017-01-27: 2 mL via INTRA_ARTICULAR

## 2017-01-27 MED ORDER — METHYLPREDNISOLONE ACETATE 40 MG/ML IJ SUSP
80.0000 mg | INTRAMUSCULAR | Status: AC | PRN
  Administered 2017-01-27: 80 mg

## 2017-01-27 NOTE — Telephone Encounter (Signed)
OV today 

## 2017-01-27 NOTE — Progress Notes (Signed)
Office Visit Note   Patient: [name]           Date of Birth: 1960/04/14           MRN: [id number] Visit Date: 01/27/2017              Requested by: Velna Hatchet, MD [address] [place], [place] [address] PCP: Velna Hatchet, MD   Assessment & Plan: Visit Diagnoses:  1. Chronic pain of right knee   2. Chronic pain of left knee     Plan: End-stage osteoarthritis both knees. We'll need a single-point cane and an arthritic brace from [employer]. Will inject right knee today with cortisone. Long discussion regarding treatment options an issue with her BMI of 48 she needs to exercise and consider weight loss have also reviewed medicines she is on multiple back and may take Tylenol  Follow-Up Instructions: Return if symptoms worsen or fail to improve.   Orders:  Orders Placed This Encounter  Procedures  . XR KNEE 3 VIEW RIGHT  . XR KNEE 3 VIEW LEFT   No orders of the defined types were placed in this encounter.     Procedures: Large Joint Inj: R knee on 01/27/2017 3:39 PM Indications: pain and diagnostic evaluation Details: 25 G 1.5 in needle, anteromedial approach  Arthrogram: No  Medications: 2 mL lidocaine 1 %; 2 mL bupivacaine 0.5 %; 80 mg methylPREDNISolone acetate 40 MG/ML Procedure, treatment alternatives, risks and benefits explained, specific risks discussed. Consent was given by the patient. Immediately prior to procedure a time out was called to verify the correct patient, procedure, equipment, support staff and site/side marked as required. Patient was prepped and draped in the usual sterile fashion.       Clinical Data: No additional findings.   Subjective: Chief Complaint  Patient presents with  . Right Knee - Pain, Numbness, Weakness, Edema    [name] is a 57 y o here today for Right and left knee pain. She relates cortisone has working in the past. Pt has not tried PT. Also had Dr. Ernestina Patches ESI  . Left Knee - Pain, Edema, Weakness,  Numbness  End-stage osteoarthritis both knees. We repeated the films today and there isn't much change some more recent films. Biggest issue is her BMI of over 48. She is on Mobic and supplements this with Tylenol. I saw her recently to inject her left knee which is giving her some relief. She is having a little more trouble on the right today.  HPI  Review of Systems   Objective: Vital Signs: Resp 16   Ht 5\' 3"  (1.6 m)   Wt 275 lb (124.7 kg)   BMI 48.71 kg/m   Physical Exam  Ortho Exam awake alert and oriented 3. Comfortable sitting. Large legs. Predominantly medial joint pain bilaterally on the right than the left. Full extension about 95 of flexion. No instability. No calf pain. Neurovascular exam intact. Straight leg raise negative. Painless range of motion of both hips and may be small effusion but difficult to determine given the size of her legs  Specialty Comments:  No specialty comments available.  Imaging: Xr Knee 3 View Left  Result Date: 01/27/2017 Films of the left knee were obtained in 3 projections standing. There is slight varus of 1-2 with significant narrowing of the medial joint space. Subchondral sclerosis degree about the tibia medially. There are peripheral osteophytes in all 3 compartments. Findings consistent with near end-stage osteoarthritis. No ectopic calcification  Xr  Knee 3 View Right  Result Date: 01/27/2017 Films of the right knee were obtained in 3 projections standing. There are significant degenerative changes in all 3 compartments with more narrowing medially. There are osteophytes in all 3 compartments so she was subchondral sclerosis. No ectopic calcification. Findings consistent with end-stage osteoarthritis    PMFS History: Patient Active Problem List   Diagnosis Date Noted  . Bilateral primary osteoarthritis of knee 12/27/2016  . 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 Prediabetes 12/03/2015  . OSA on CPAP 02/19/2015   . Morbid obesity ([place]) 10/09/2014  . Pulmonary HTN ([place]) 05/12/2013  . Bilateral lower extremity edema 05/12/2013  . Morbid obesity with BMI of 50.0-59.9, adult ([place])   . Back pain, hx of prior back surgery 12/09/2011  . Pancreatitis, mild, CT negative 12/08/2011  . Fatty liver 12/08/2011  . Substernal chest pain, suspected secondary to acute pancreatitis 12/06/2011  . Hypertension 12/06/2011  . Hypokalemia 12/06/2011  . Hypothyroidism 12/06/2011  . GERD (gastroesophageal reflux disease) 12/06/2011  . Anxiety 12/06/2011  . BV (bacterial vaginosis)   . Hyperlipidemia    Past Medical History:  Diagnosis Date  . Anemia when she was young  . Arthritis   . Congestion of nasal sinus   . Constipation    takes Colace every other day  . DDD (degenerative disc disease), lumbar   . Diabetes mellitus without complication ([place])    per pt had been diagnosed but then "number started going down so my doctor said i didnt have diabetes"  . Dry eyes    uses Systane Eye drops daily as needed  . GERD (gastroesophageal reflux disease) 12/06/2011   takes Nexium daily  . Headache(784.0)    occasionally-d/t congestion  . History of bronchitis 6-7 yrs ago  . Hyperlipidemia   . Hypertension    takes Metoprolol and Diovan daily  . Hypothyroidism 12/06/2011   takes Synthroid daily  . Joint pain   . Multiple allergies    takes Zyrtec daily;uses Nasonex daily as needed  . Nocturia   . Numbness    weakness-right hand  . OSA on CPAP   . Pancreatitis   . Peripheral edema    takes Furosemide daily  . Shingles   . Urinary frequency   . Venous insufficiency of leg     Family History  Problem Relation Age of Onset  . Hypertension Mother   . Hypertension Father   . Hypertension Sister   . Kidney disease Sister        [facility] TRANSPLANT  . Thyroid disease Sister   . Thyroid disease Sister   . Hypertension Sister   . Breast cancer Maternal Aunt        >50; passed away from it  . Breast cancer  Maternal Aunt        >50    Past Surgical History:  Procedure Laterality Date  . ABDOMINAL HYSTERECTOMY     uterine prolapse  . BACK SURGERY     fusion  . CARDIAC CATHETERIZATION  2007   NORMAL; catheter-induced spasm; per pat had this done d/t chest pain that was r/o as related to GERD, now on nexium denies any chest pain  . CHOLECYSTECTOMY    . COLONOSCOPY    . ESOPHAGOGASTRODUODENOSCOPY  5-35yrs ago  . FOOT SURGERY Bilateral   . LAPAROSCOPIC GASTRIC SLEEVE RESECTION N/A 08/23/2016   Procedure: LAPAROSCOPIC GASTRIC SLEEVE RESECTION WITH UPPER ENDO;  Surgeon: Johnathan Hausen, MD;  Location: [facility];  Service:  General;  Laterality: N/A;  . SHOULDER ARTHROSCOPY WITH ROTATOR CUFF REPAIR AND SUBACROMIAL DECOMPRESSION Right 08/23/2013   Procedure: RIGHT SHOULDER ARTHROSCOPY WITH  SUBACROMIAL DECOMPRESSION DISTAL CLAVICLE RESECTION AND  ROTATOR CUFF REPAIR ;  Surgeon: Marin Shutter, MD;  Location: [facility];  Service: Orthopedics;  Laterality: Right;  . TUBAL LIGATION     Social History   Occupational History    Employer: [employer]    Comment: [employer]  Tobacco Use  . Smoking status: Former Smoker    Packs/day: 0.75    Years: 40.00    Pack years: 30.00    Types: Cigarettes    Last attempt to quit: 1993    Years since quitting: 26.0  . Smokeless tobacco: Never Used  . Tobacco comment: quit smoking in 1993  Substance and Sexual Activity  . Alcohol use: Yes    Alcohol/week: 0.0 oz    Comment: rarely;; mixed drink   . Drug use: No  . Sexual activity: Yes    Birth control/protection: Surgical     Garald Balding, MD   Note - This record has been created using [employer].  Chart creation errors have been sought, but may not always  have been located. Such creation errors do not reflect on  the standard of medical care.

## 2017-01-27 NOTE — Telephone Encounter (Signed)
In the pts chart, the note say that Advil doesn't help. Should she get this RX?

## 2017-01-27 NOTE — Progress Notes (Deleted)
Office Visit Note   Patient: [name]           Date of Birth: Oct 13, 1960           MRN: [id number] Visit Date: 01/27/2017              Requested by: Velna Hatchet, MD [address] [place], [place] [address] PCP: Velna Hatchet, MD   Assessment & Plan: Visit Diagnoses: No diagnosis found.  Plan: ***  Follow-Up Instructions: No Follow-up on file.   Orders:  No orders of the defined types were placed in this encounter.  No orders of the defined types were placed in this encounter.     Procedures: Large Joint Inj: R knee on 01/27/2017 3:19 PM Indications: pain and diagnostic evaluation Details: 25 G 1.5 in needle, anteromedial approach  Arthrogram: No  Medications: 2 mL lidocaine 1 %; 2 mL bupivacaine 0.5 %; 80 mg methylPREDNISolone acetate 40 MG/ML Procedure, treatment alternatives, risks and benefits explained, specific risks discussed. Consent was given by the patient. Immediately prior to procedure a time out was called to verify the correct patient, procedure, equipment, support staff and site/side marked as required. Patient was prepped and draped in the usual sterile fashion.       Clinical Data: No additional findings.   Subjective: Chief Complaint  Patient presents with  . Right Knee - Pain, Numbness, Weakness, Edema    [name] is a 57 y o here today for Right and left knee pain. She relates cortisone has working in the past. Pt has not tried PT. Also had Dr. Ernestina Patches ESI  . Left Knee - Pain, Edema, Weakness, Numbness    HPI  Review of Systems  Constitutional: Positive for diaphoresis. Negative for chills, fatigue and fever.  Eyes: Negative for itching.  Respiratory: Negative for chest tightness and shortness of breath.   Cardiovascular: Positive for leg swelling. Negative for chest pain and palpitations.  Gastrointestinal: Negative for blood in stool, constipation and diarrhea.  Endocrine: Negative for polyuria.  Genitourinary:  Negative for dysuria.  Musculoskeletal: Positive for back pain and neck pain. Negative for joint swelling and neck stiffness.  Allergic/Immunologic: Negative for immunocompromised state.  Neurological: Positive for headaches. Negative for dizziness and numbness.  Hematological: Does not bruise/bleed easily.  Psychiatric/Behavioral: Positive for sleep disturbance. The patient is not nervous/anxious.      Objective: Vital Signs: There were no vitals taken for this visit.  Physical Exam  Ortho Exam  Specialty Comments:  No specialty comments available.  Imaging: No results found.   PMFS History: Patient Active Problem List   Diagnosis Date Noted  . Bilateral primary osteoarthritis of knee 12/27/2016  . 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 Prediabetes 12/03/2015  . OSA on CPAP 02/19/2015  . Morbid obesity ([place]) 10/09/2014  . Pulmonary HTN ([place]) 05/12/2013  . Bilateral lower extremity edema 05/12/2013  . Morbid obesity with BMI of 50.0-59.9, adult ([place])   . Back pain, hx of prior back surgery 12/09/2011  . Pancreatitis, mild, CT negative 12/08/2011  . Fatty liver 12/08/2011  . Substernal chest pain, suspected secondary to acute pancreatitis 12/06/2011  . Hypertension 12/06/2011  . Hypokalemia 12/06/2011  . Hypothyroidism 12/06/2011  . GERD (gastroesophageal reflux disease) 12/06/2011  . Anxiety 12/06/2011  . BV (bacterial vaginosis)   . Hyperlipidemia    Past Medical History:  Diagnosis Date  . Anemia when she was young  . Arthritis   . Congestion of nasal sinus   .  Constipation    takes Colace every other day  . DDD (degenerative disc disease), lumbar   . Diabetes mellitus without complication ([place])    per pt had been diagnosed but then "number started going down so my doctor said i didnt have diabetes"  . Dry eyes    uses Systane Eye drops daily as needed  . GERD (gastroesophageal reflux disease) 12/06/2011   takes Nexium daily  .  Headache(784.0)    occasionally-d/t congestion  . History of bronchitis 6-7 yrs ago  . Hyperlipidemia   . Hypertension    takes Metoprolol and Diovan daily  . Hypothyroidism 12/06/2011   takes Synthroid daily  . Joint pain   . Multiple allergies    takes Zyrtec daily;uses Nasonex daily as needed  . Nocturia   . Numbness    weakness-right hand  . OSA on CPAP   . Pancreatitis   . Peripheral edema    takes Furosemide daily  . Shingles   . Urinary frequency   . Venous insufficiency of leg     Family History  Problem Relation Age of Onset  . Hypertension Mother   . Hypertension Father   . Hypertension Sister   . Kidney disease Sister        [facility] TRANSPLANT  . Thyroid disease Sister   . Thyroid disease Sister   . Hypertension Sister   . Breast cancer Maternal Aunt        >50; passed away from it  . Breast cancer Maternal Aunt        >50    Past Surgical History:  Procedure Laterality Date  . ABDOMINAL HYSTERECTOMY     uterine prolapse  . BACK SURGERY     fusion  . CARDIAC CATHETERIZATION  2007   NORMAL; catheter-induced spasm; per pat had this done d/t chest pain that was r/o as related to GERD, now on nexium denies any chest pain  . CHOLECYSTECTOMY    . COLONOSCOPY    . ESOPHAGOGASTRODUODENOSCOPY  5-64yrs ago  . FOOT SURGERY Bilateral   . LAPAROSCOPIC GASTRIC SLEEVE RESECTION N/A 08/23/2016   Procedure: LAPAROSCOPIC GASTRIC SLEEVE RESECTION WITH UPPER ENDO;  Surgeon: Johnathan Hausen, MD;  [facility];  Service: General;  Laterality: N/A;  . SHOULDER ARTHROSCOPY WITH ROTATOR CUFF REPAIR AND SUBACROMIAL DECOMPRESSION Right 08/23/2013   Procedure: RIGHT SHOULDER ARTHROSCOPY WITH  SUBACROMIAL DECOMPRESSION DISTAL CLAVICLE RESECTION AND  ROTATOR CUFF REPAIR ;  Surgeon: Marin Shutter, MD;  Location: [facility];  Service: Orthopedics;  Laterality: Right;  . TUBAL LIGATION     Social History   Occupational History    Employer: [employer]    Comment: [employer]  Tobacco Use    . Smoking status: Former Smoker    Packs/day: 0.75    Years: 40.00    Pack years: 30.00    Types: Cigarettes    Last attempt to quit: 1993    Years since quitting: 26.0  . Smokeless tobacco: Never Used  . Tobacco comment: quit smoking in 1993  Substance and Sexual Activity  . Alcohol use: Yes    Alcohol/week: 0.0 oz    Comment: rarely;; mixed drink   . Drug use: No  . Sexual activity: Yes    Birth control/protection: Surgical

## 2017-01-27 NOTE — Telephone Encounter (Signed)
Call pt and be sure advil is OK

## 2017-02-01 ENCOUNTER — Other Ambulatory Visit: Payer: Self-pay

## 2017-02-01 MED ORDER — IRBESARTAN 300 MG PO TABS: 300.0000 mg | ORAL_TABLET | Freq: Every day | ORAL | 3 refills | Status: DC

## 2017-02-07 ENCOUNTER — Ambulatory Visit (INDEPENDENT_AMBULATORY_CARE_PROVIDER_SITE_OTHER): Payer: 59 | Admitting: Orthopaedic Surgery

## 2017-02-11 ENCOUNTER — Other Ambulatory Visit: Payer: Self-pay | Admitting: Cardiovascular Disease

## 2017-02-11 NOTE — Telephone Encounter (Signed)
REFILL 

## 2017-02-18 ENCOUNTER — Telehealth: Payer: Self-pay

## 2017-02-18 MED ORDER — LOSARTAN POTASSIUM 100 MG PO TABS: 100.0000 mg | ORAL_TABLET | Freq: Every day | ORAL | 3 refills | Status: DC

## 2017-02-18 NOTE — Telephone Encounter (Signed)
Patient reports increased vision problems since starting irbesartan. Discussed with MCr - change to losartan 100 mg daily.  Rx(s) sent to pharmacy electronically.

## 2017-02-24 ENCOUNTER — Telehealth (INDEPENDENT_AMBULATORY_CARE_PROVIDER_SITE_OTHER): Payer: Self-pay | Admitting: Physical Medicine and Rehabilitation

## 2017-02-24 NOTE — Telephone Encounter (Signed)
Has she tried their website for discount card?  Or try [email or web address]?  If still too high would need OV to talk

## 2017-02-24 NOTE — Telephone Encounter (Signed)
Patient is going to try [email or web address] and will call us back.

## 2017-02-28 ENCOUNTER — Other Ambulatory Visit (INDEPENDENT_AMBULATORY_CARE_PROVIDER_SITE_OTHER): Payer: Self-pay | Admitting: Physical Medicine and Rehabilitation

## 2017-02-28 DIAGNOSIS — M792 Neuralgia and neuritis, unspecified: Secondary | ICD-10-CM

## 2017-02-28 MED ORDER — PREGABALIN 150 MG PO CAPS: ORAL_CAPSULE | ORAL | 4 refills | Status: DC

## 2017-02-28 NOTE — Progress Notes (Signed)
Filled out paperwork for [place] discount on Lyrica, wrote new spript for 90 day supply wit 4 refills.

## 2017-03-09 DIAGNOSIS — J329 Chronic sinusitis, unspecified: Secondary | ICD-10-CM | POA: Diagnosis not present

## 2017-03-09 DIAGNOSIS — E119 Type 2 diabetes mellitus without complications: Secondary | ICD-10-CM | POA: Diagnosis not present

## 2017-03-09 DIAGNOSIS — Z6841 Body Mass Index (BMI) 40.0 and over, adult: Secondary | ICD-10-CM | POA: Diagnosis not present

## 2017-03-17 ENCOUNTER — Telehealth (INDEPENDENT_AMBULATORY_CARE_PROVIDER_SITE_OTHER): Payer: Self-pay | Admitting: Physical Medicine and Rehabilitation

## 2017-03-17 NOTE — Telephone Encounter (Signed)
Yes ok 

## 2017-03-17 NOTE — Telephone Encounter (Signed)
Scheduled for 03/31/17 at 1600.

## 2017-03-23 IMAGING — CR DG UGI W/ KUB
1 series · 1 of 1 positions shown · non-contrast
Comparison: None.

CLINICAL DATA: Preop for gastric sleeve surgery.

EXAM:
UPPER GI SERIES WITH KUB
TECHNIQUE: After obtaining a scout radiograph a routine upper GI series was
performed using thin barium.
FLUOROSCOPY TIME:  Fluoroscopy Time:  1 minutes 20 seconds.
Radiation Exposure Index (if provided by the fluoroscopic device):
32.2 mGy
Number of Acquired Spot Images: 7

[t abdomen supine]
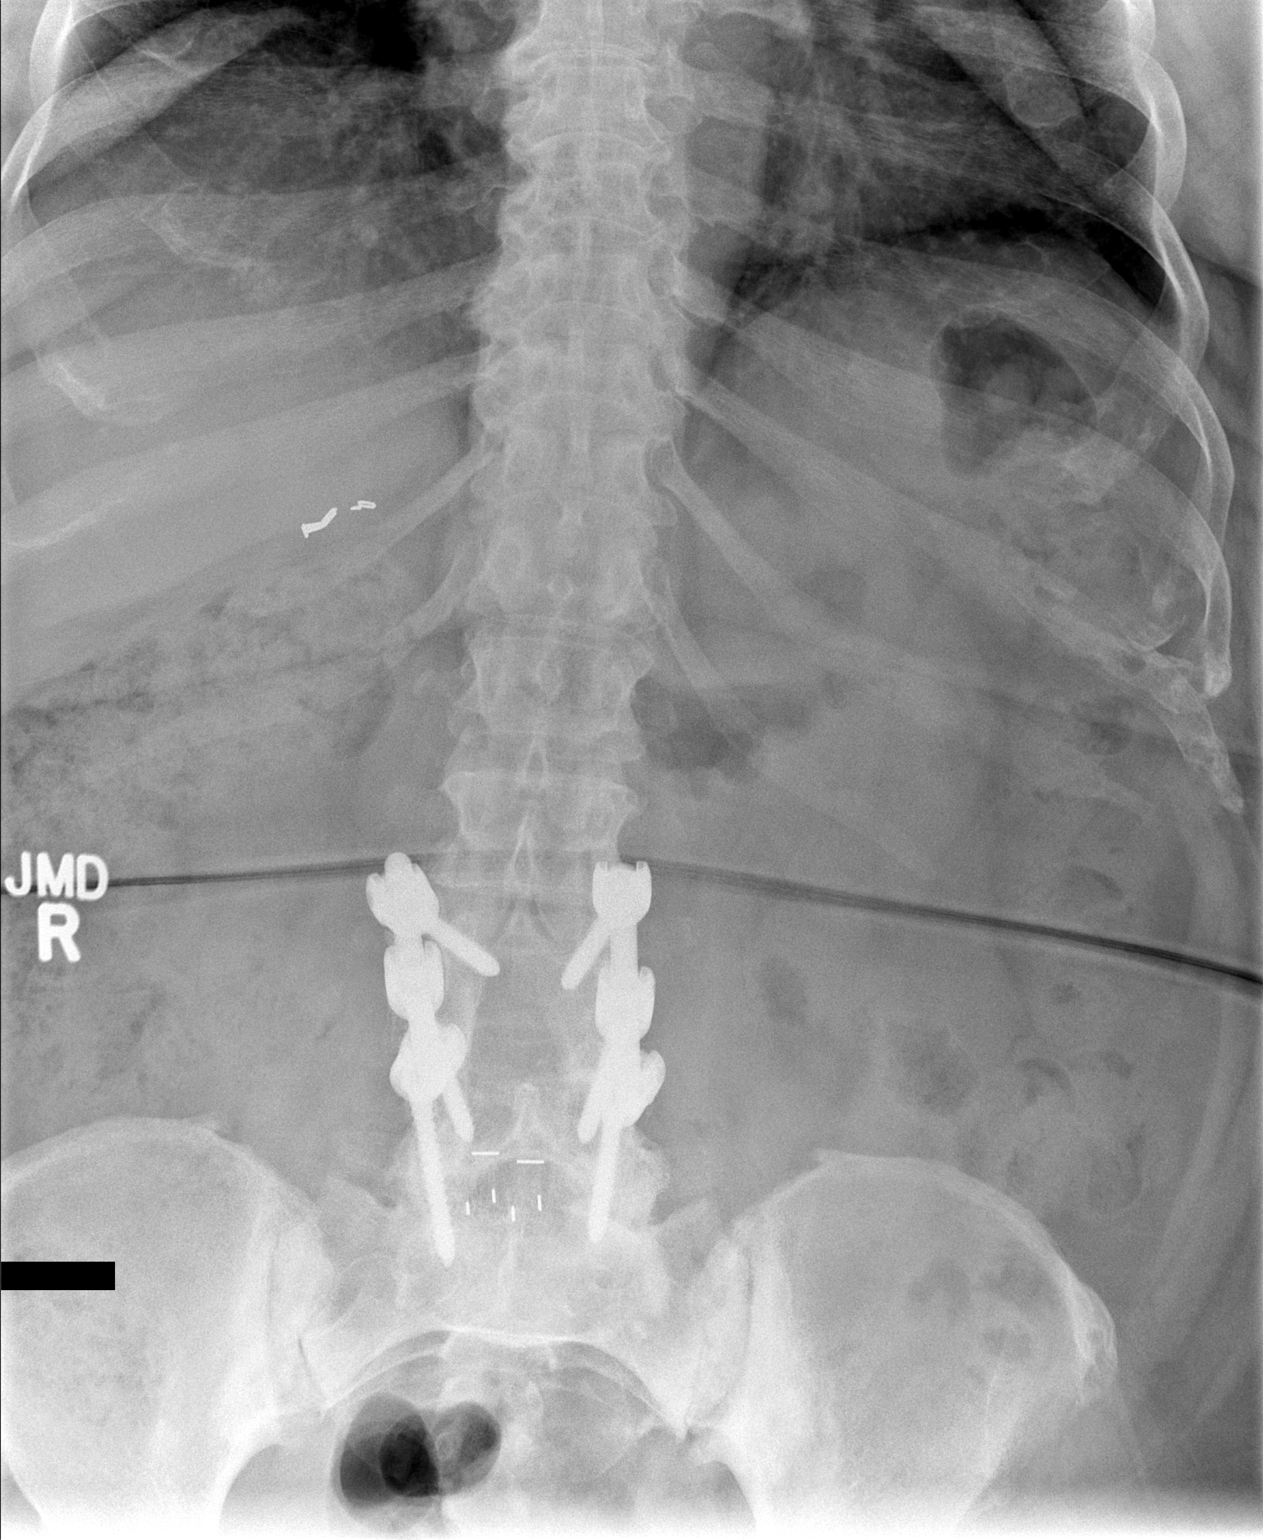

[1 of 1 positions shown; findings below may reference images not displayed]

FINDINGS: Scout view of the abdomen shows a normal bowel gas pattern. Surgical
clips in the right upper quadrant. L3-L5 posterior lumbar interbody
fusion.

Single contrast examination of the upper gastrointestinal tract
shows normal esophageal motility. No esophageal fold thickening,
stricture or obstruction. Stomach is unremarkable.
IMPRESSION: Normal exam.

## 2017-03-23 IMAGING — DX DG CHEST 2V
2 series · 2 of 2 positions shown · non-contrast
Comparison: Chest x-ray 01/14/2016.

CLINICAL DATA: Gastric surgery.  Preoperative exam .

EXAM:
CHEST  2 VIEW

[chest pa]
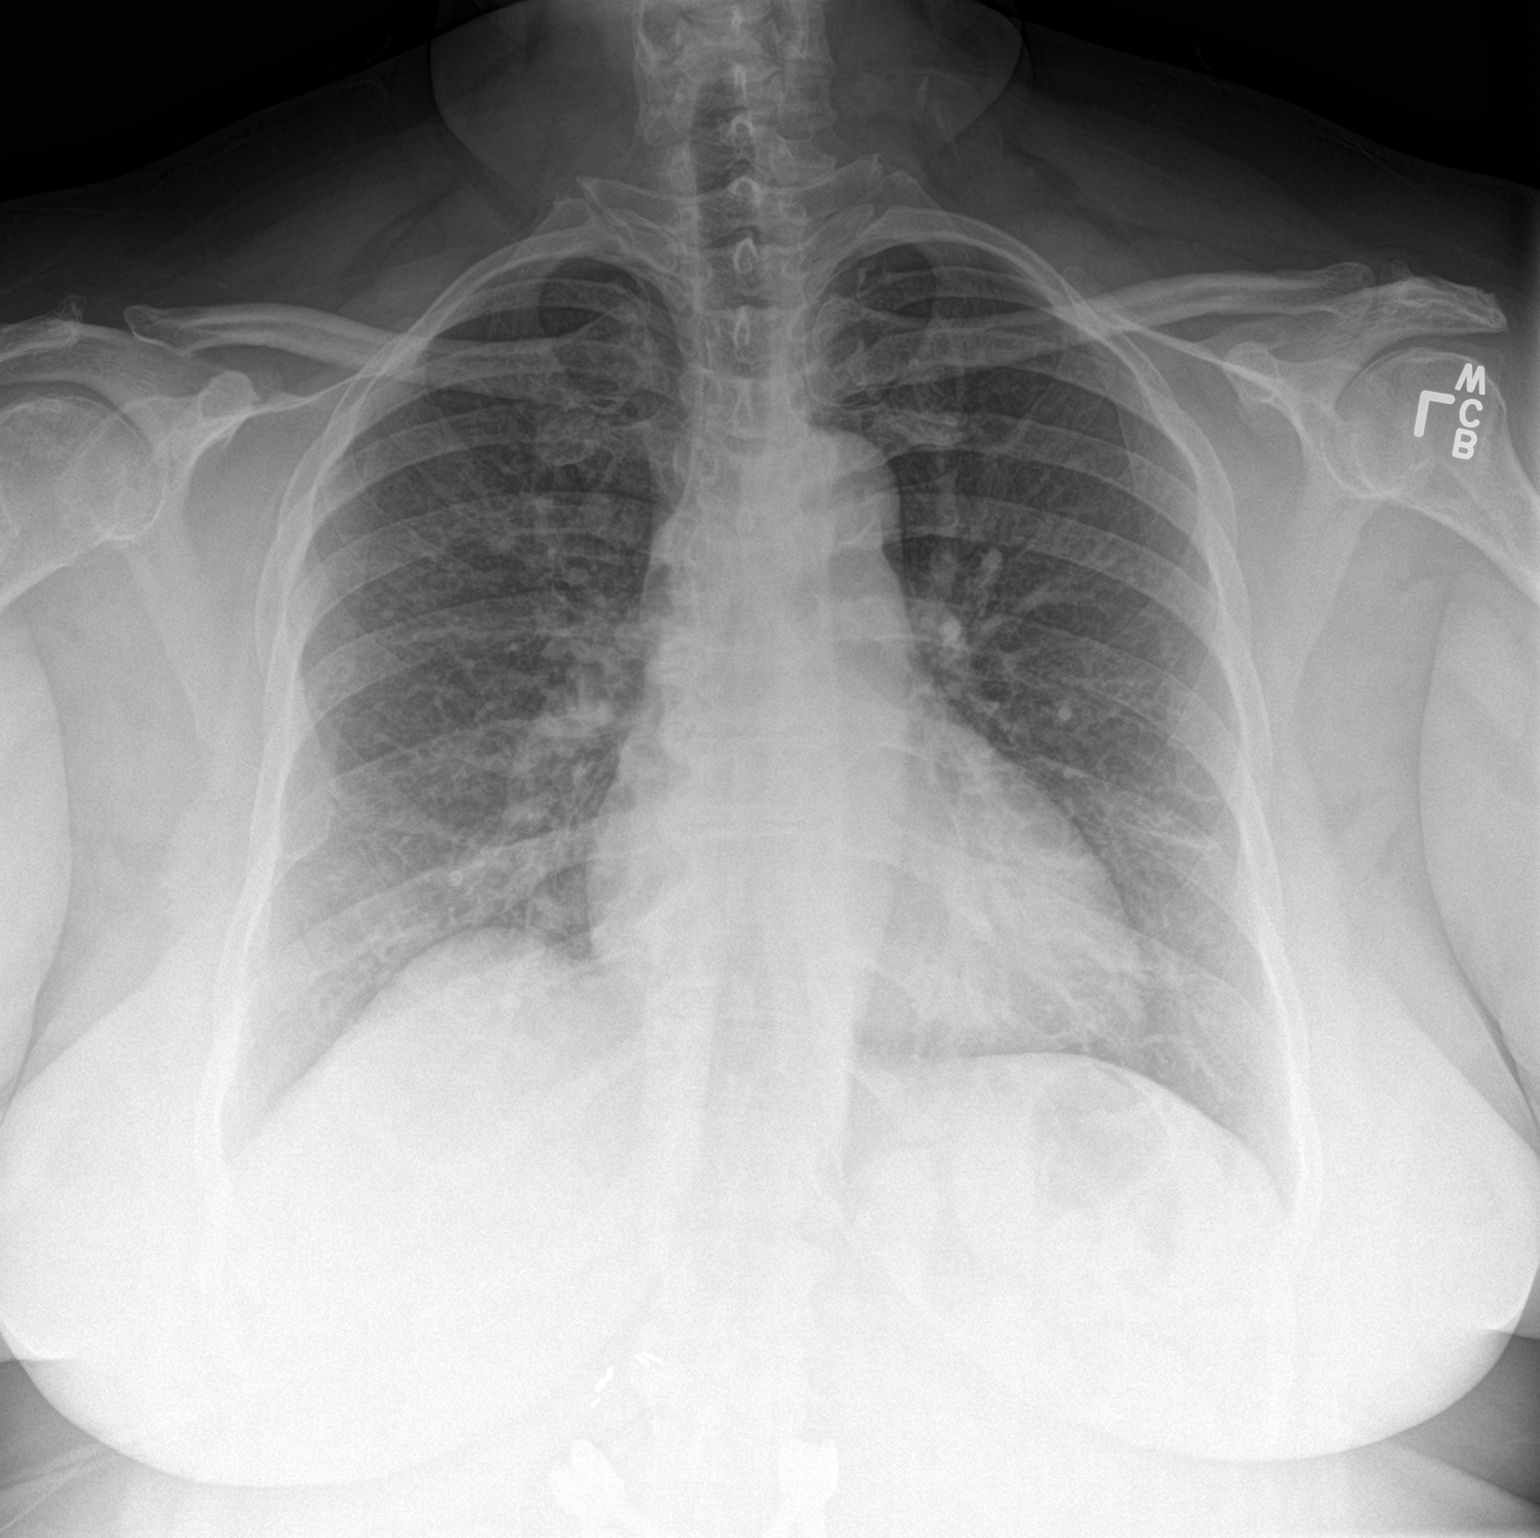

[chest lat]
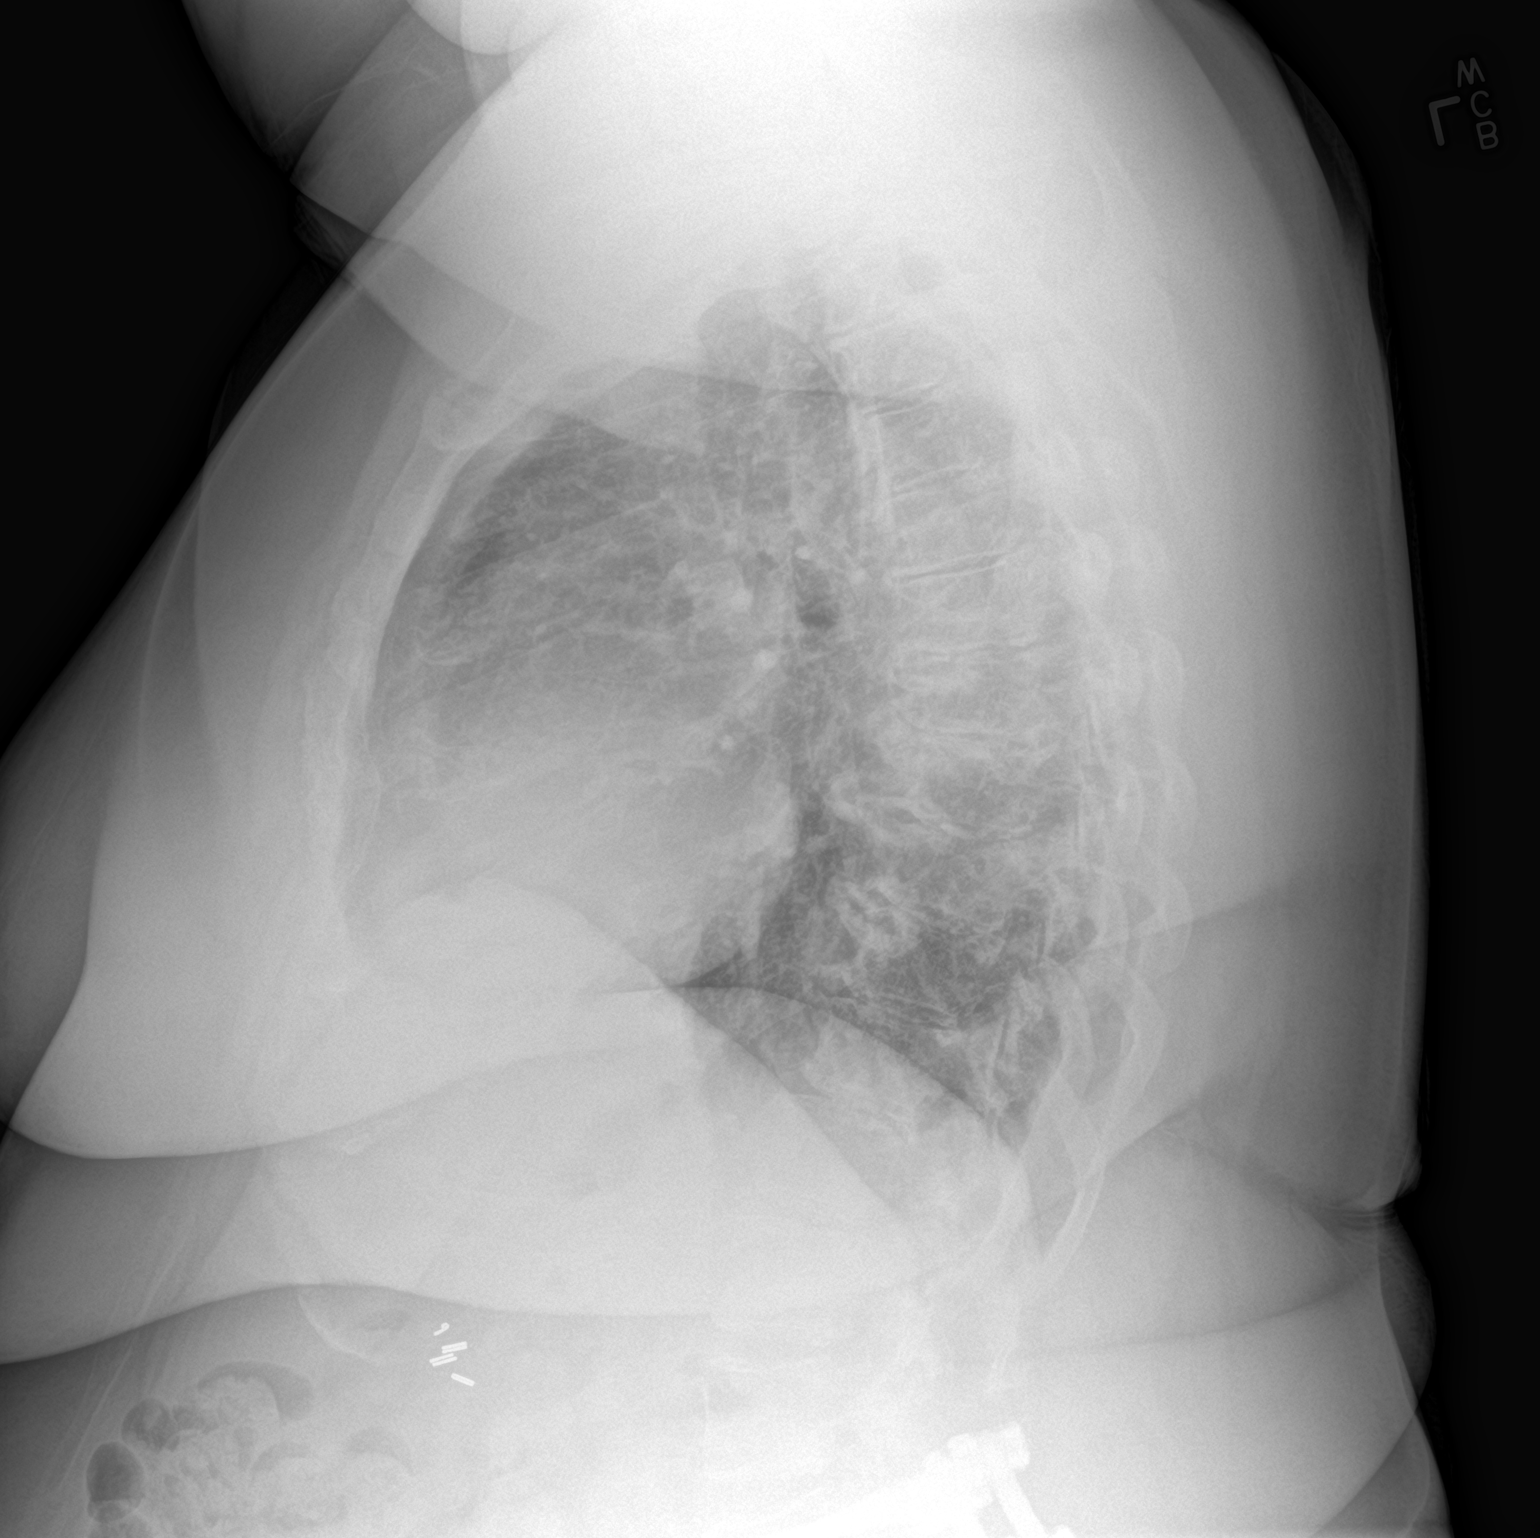

[2 of 2 positions shown; findings below may reference images not displayed]

FINDINGS: Mediastinum hilar structures are normal. Heart size normal patch
that stable mild cardiomegaly. Mild pulmonary vascular prominence.
No evidence of overt pulmonary edema. Previously identified
infiltrate on chest x-ray of 01/14/2016 has cleared. Mild basilar
subsegmental atelectasis . No pneumothorax. No acute bony
abnormality .
IMPRESSION: 1. Interim clearing of prominent right pulmonary infiltrate. Mild
basilar subsegmental atelectasis.

2. Mild cardiomegaly with mild pulmonary venous congestion.

## 2017-03-31 ENCOUNTER — Encounter (INDEPENDENT_AMBULATORY_CARE_PROVIDER_SITE_OTHER): Payer: Self-pay | Admitting: Physical Medicine and Rehabilitation

## 2017-03-31 ENCOUNTER — Ambulatory Visit (INDEPENDENT_AMBULATORY_CARE_PROVIDER_SITE_OTHER): Payer: Self-pay

## 2017-03-31 ENCOUNTER — Ambulatory Visit (INDEPENDENT_AMBULATORY_CARE_PROVIDER_SITE_OTHER): Payer: 59 | Admitting: Physical Medicine and Rehabilitation

## 2017-03-31 DIAGNOSIS — G894 Chronic pain syndrome: Secondary | ICD-10-CM | POA: Diagnosis not present

## 2017-03-31 DIAGNOSIS — H538 Other visual disturbances: Secondary | ICD-10-CM

## 2017-03-31 DIAGNOSIS — Z9884 Bariatric surgery status: Secondary | ICD-10-CM | POA: Diagnosis not present

## 2017-03-31 DIAGNOSIS — M961 Postlaminectomy syndrome, not elsewhere classified: Secondary | ICD-10-CM

## 2017-03-31 DIAGNOSIS — M461 Sacroiliitis, not elsewhere classified: Secondary | ICD-10-CM

## 2017-03-31 NOTE — Progress Notes (Signed)
 .  Numeric Pain Rating Scale and Functional Assessment Average Pain 7   In the last MONTH (on 0-10 scale) has pain interfered with the following?  1. General activity like being  able to carry out your everyday physical activities such as walking, climbing stairs, carrying groceries, or moving a chair?  Rating(6)   -Dye Allergies.  

## 2017-03-31 NOTE — Progress Notes (Signed)
[name] - 57 y.o. female MRN [id number]  Date of birth: 02/29/1960  Office Visit Note: Visit Date: 03/31/2017 PCP: Velna Hatchet, MD Referred by: Velna Hatchet, MD  Subjective: Chief Complaint  Patient presents with  . Lower Back - Pain  . Right Leg - Pain   HPI: [name] is a 57 year old female that I have not seen since August of last year.  She is followed by Dr. Durward Fortes in our office for her bilateral knee arthritis.  Since I have seen her she has had gastric sleeve bypass surgery for weight loss and has lost over 40 pounds.  She has been somewhat has had lower lumbar fusion down to L5.  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  She continues to have worsening low back pain across the buttock area.  This is worse with sitting for prolonged periods but also standing.  Is really worse going from sit to stand.  She reports her onset of pain about a month ago from prior levels.  She says she was doing fairly well after the last injection which was bilateral sacroiliac joint injections.  We have tried injections of the facet joints below the fusion but is very difficult to do her body habitus and low-lying L5 segment.  Nonetheless she comes in today with no specific injury no red flag complaints.  No radicular pain.  She has no numbness tingling or paresthesia.  She does report starting to take the Lyrica again after we were able to obtain some financing for her.  Overall he is hopeful that Lyrica will go generic at some point.  The Lyrica does seem to help her quite a bit.  She continues to take meloxicam as well.  The meloxicam is followed by her primary care physician.  She does have a history of some hypertension and pulmonary hypertension as well as obstructive sleep apnea etc.  In terms of the Lyrica she actually did note today that she has had some blurry vision and she is actually seeing her eye doctor soon.  She was unsure of the  timing of the blurry vision but has been on Lyrica for quite some time.  She does take a fairly decent dose of Lyrica daily.  1 of the known side effects is blurry vision.  She has not had any swelling.    Review of Systems  Constitutional: Negative for chills, fever, malaise/fatigue and weight loss.  HENT: Negative for hearing loss and sinus pain.   Eyes: Positive for blurred vision. Negative for double vision and photophobia.  Respiratory: Negative for cough and shortness of breath.   Cardiovascular: Negative for chest pain, palpitations and leg swelling.  Gastrointestinal: Negative for abdominal pain, nausea and vomiting.  Genitourinary: Negative for flank pain.  Musculoskeletal: Positive for back pain and joint pain. Negative for myalgias.  Skin: Negative for itching and rash.  Neurological: Negative for tremors, focal weakness and weakness.  Endo/Heme/Allergies: Negative.   Psychiatric/Behavioral: Negative for depression.  All other systems reviewed and are negative.  Otherwise per HPI.  Assessment & Plan: Visit Diagnoses:  1. Sacroiliitis ([place])   2. Post laminectomy syndrome   3. Chronic pain syndrome   4. Blurry vision, bilateral   5. Morbid obesity ([place])   6. 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  She had some arthritic changes below the fusion level and she has had sacroiliac joint changes and sclerosis as well.  Bilateral sacroiliac joint injections have been very beneficial.  We did repeat those today.  She continues to take Lyrica for these issues as well and this is been very beneficial as long as she gets it with some financing as it is quite expensive through her insurance.  Interestingly she reported blurry vision today no other real changes in her vision.  She has gone follow-up with her  eye doctor.  I would take recommendations from them obviously we could look at reducing the Lyrica to see if the blurry vision goes away.  We may want to do that anyway I talked to her about reducing it just to 1 dose instead of 2.  We can see if that changed her vision.  Other avenues for similar medication would be gabapentin versus Topamax both of which she has tried but it may be worth trying again.  She might do well with another type of medication more of a norepinephrine modifying medication.  Regardless right now I think she is doing well with intermittent sacroiliac joint injections and she will follow-up with her eye doctor and will see what happens there.  She will continue to follow-up with Dr. Durward Fortes for her knees.    Meds & Orders: No orders of the defined types were placed in this encounter.   Orders Placed This Encounter  Procedures  . Sacroiliac Joint Inj  . Sacroiliac Joint Inj  . XR C-ARM NO REPORT    Follow-up: Return if symptoms worsen or fail to improve.   Procedures: Sacroiliac Joint Inj on 03/31/2017 4:25 PM Indications: pain and diagnostic evaluation Details: 22 G 3.5 in needle, fluoroscopy-guided posterior approach Medications: 2 mL bupivacaine 0.5 %; 40 mg methylPREDNISolone acetate 40 MG/ML Outcome: tolerated well, no immediate complications  There was excellent flow of contrast producing a partial arthrogram of the sacroiliac joint.  Procedure, treatment alternatives, risks and benefits explained, specific risks discussed. Consent was given by the patient. Immediately prior to procedure a time out was called to verify the correct patient, procedure, equipment, support staff and site/side marked as required. Patient was prepped and draped in the usual sterile fashion.   Sacroiliac Joint Inj on 03/31/2017 4:25 PM Indications: pain and diagnostic evaluation Details: 22 G 3.5 in needle, fluoroscopy-guided posterior approach Medications: 2 mL bupivacaine 0.5 %; 40 mg  methylPREDNISolone acetate 40 MG/ML Outcome: tolerated well, no immediate complications  There was excellent flow of contrast producing a partial arthrogram of the sacroiliac joint.  Procedure, treatment alternatives, risks and benefits explained, specific risks discussed. Consent was given by the patient. Immediately prior to procedure a time out was called to verify the correct patient, procedure, equipment, support staff and site/side marked as required. Patient was prepped and draped in the usual sterile fashion.      No notes on file   Clinical History: Lumbar spine MRI dated 06/11/2014 IMPRESSION: Interval fusion L3-4 L4-5. Posterior decompression at these levels without stenosis.  Progressive facet degeneration and mild spinal stenosis at L2-3.  Progressive facet degeneration on the left at L5-S1 with left foraminal encroachment.    She reports that she quit smoking about 26 years ago. Her smoking use included cigarettes. She has a 30.00 pack-year smoking history. she has never used smokeless tobacco. No results for input(s): HGBA1C, LABURIC in the last 8760 hours.  Objective:  VS:  HT:    WT:   BMI:     BP:   HR: bpm  TEMP: ( )  RESP:  Physical Exam  Constitutional: She is oriented to person, place, and time. She appears well-developed and well-nourished.  Obese but has obviously lost some weight  Eyes: Conjunctivae and EOM are normal. Pupils are equal, round, and reactive to light.  Cardiovascular: Normal rate and intact distal pulses.  Pulmonary/Chest: Effort normal.  Abdominal: Soft. She exhibits no distension.  Musculoskeletal:  Patient is slow to rise from a seated position does have pain with extension.  She does have a positive Fortin finger test bilaterally.  She does have an equivocally positive Patrick's exam bilaterally.  She has no pain over the greater trochanters and she has good distal strength.  Neurological: She is alert and oriented to person,  place, and time. She exhibits normal muscle tone.  Skin: Skin is warm and dry. No rash noted. No erythema.  Psychiatric: She has a normal mood and affect. Her behavior is normal.  Nursing note and vitals reviewed.   Ortho Exam Imaging: No results found.  Past Medical/Family/Surgical/Social History: Medications & Allergies reviewed per EMR, new medications updated. Patient Active Problem List   Diagnosis Date Noted  . Bilateral primary osteoarthritis of knee 12/27/2016  . 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 Prediabetes 12/03/2015  . OSA on CPAP 02/19/2015  . Morbid obesity ([place]) 10/09/2014  . Pulmonary HTN ([place]) 05/12/2013  . Bilateral lower extremity edema 05/12/2013  . Morbid obesity with BMI of 50.0-59.9, adult ([place])   . Back pain, hx of prior back surgery 12/09/2011  . Pancreatitis, mild, CT negative 12/08/2011  . Fatty liver 12/08/2011  . Substernal chest pain, suspected secondary to acute pancreatitis 12/06/2011  . Hypertension 12/06/2011  . Hypokalemia 12/06/2011  . Hypothyroidism 12/06/2011  . GERD (gastroesophageal reflux disease) 12/06/2011  . Anxiety 12/06/2011  . BV (bacterial vaginosis)   . Hyperlipidemia    Past Medical History:  Diagnosis Date  . Anemia when she was young  . Arthritis   . Congestion of nasal sinus   . Constipation    takes Colace every other day  . DDD (degenerative disc disease), lumbar   . Diabetes mellitus without complication ([place])    per pt had been diagnosed but then "number started going down so my doctor said i didnt have diabetes"  . Dry eyes    uses Systane Eye drops daily as needed  . GERD (gastroesophageal reflux disease) 12/06/2011   takes Nexium daily  . Headache(784.0)    occasionally-d/t congestion  . History of bronchitis 6-7 yrs ago  . Hyperlipidemia   . Hypertension    takes Metoprolol and Diovan daily  . Hypothyroidism 12/06/2011   takes Synthroid daily  . Joint pain   . Multiple  allergies    takes Zyrtec daily;uses Nasonex daily as needed  . Nocturia   . Numbness    weakness-right hand  . OSA on CPAP   . Pancreatitis   . Peripheral edema    takes Furosemide daily  . Shingles   . Urinary frequency   . Venous insufficiency of leg    Family History  Problem Relation Age of Onset  . Hypertension Mother   . Hypertension Father   . Hypertension Sister   . Kidney disease Sister        [facility] TRANSPLANT  . Thyroid disease Sister   . Thyroid disease Sister   .  Hypertension Sister   . Breast cancer Maternal Aunt        >50; passed away from it  . Breast cancer Maternal Aunt        >50   Past Surgical History:  Procedure Laterality Date  . ABDOMINAL HYSTERECTOMY     uterine prolapse  . BACK SURGERY     fusion  . CARDIAC CATHETERIZATION  2007   NORMAL; catheter-induced spasm; per pat had this done d/t chest pain that was r/o as related to GERD, now on nexium denies any chest pain  . CHOLECYSTECTOMY    . COLONOSCOPY    . ESOPHAGOGASTRODUODENOSCOPY  5-60yrs ago  . FOOT SURGERY Bilateral   . LAPAROSCOPIC GASTRIC SLEEVE RESECTION N/A 08/23/2016   Procedure: LAPAROSCOPIC GASTRIC SLEEVE RESECTION WITH UPPER ENDO;  Surgeon: Johnathan Hausen, MD;  Location: [facility];  Service: General;  Laterality: N/A;  . SHOULDER ARTHROSCOPY WITH ROTATOR CUFF REPAIR AND SUBACROMIAL DECOMPRESSION Right 08/23/2013   Procedure: RIGHT SHOULDER ARTHROSCOPY WITH  SUBACROMIAL DECOMPRESSION DISTAL CLAVICLE RESECTION AND  ROTATOR CUFF REPAIR ;  Surgeon: Marin Shutter, MD;  Location: [facility];  Service: Orthopedics;  Laterality: Right;  . TUBAL LIGATION     Social History   Occupational History    Employer: [employer]    Comment: [employer]  Tobacco Use  . Smoking status: Former Smoker    Packs/day: 0.75    Years: 40.00    Pack years: 30.00    Types: Cigarettes    Last attempt to quit: 1993    Years since quitting: 26.2  . Smokeless tobacco: Never Used  . Tobacco comment: quit smoking in  1993  Substance and Sexual Activity  . Alcohol use: Yes    Alcohol/week: 0.0 oz    Comment: rarely;; mixed drink   . Drug use: No  . Sexual activity: Yes    Birth control/protection: Surgical

## 2017-03-31 NOTE — Patient Instructions (Signed)

## 2017-04-04 DIAGNOSIS — Z0100 Encounter for examination of eyes and vision without abnormal findings: Secondary | ICD-10-CM | POA: Diagnosis not present

## 2017-04-07 ENCOUNTER — Other Ambulatory Visit: Payer: Self-pay | Admitting: Cardiovascular Disease

## 2017-04-08 ENCOUNTER — Encounter (INDEPENDENT_AMBULATORY_CARE_PROVIDER_SITE_OTHER): Payer: Self-pay | Admitting: Physical Medicine and Rehabilitation

## 2017-04-08 MED ORDER — METHYLPREDNISOLONE ACETATE 40 MG/ML IJ SUSP
40.0000 mg | INTRAMUSCULAR | Status: AC | PRN
  Administered 2017-03-31: 40 mg via INTRA_ARTICULAR

## 2017-04-08 MED ORDER — BUPIVACAINE HCL 0.5 % IJ SOLN
2.0000 mL | INTRAMUSCULAR | Status: AC | PRN
  Administered 2017-03-31: 2 mL via INTRA_ARTICULAR

## 2017-04-12 DIAGNOSIS — G4733 Obstructive sleep apnea (adult) (pediatric): Secondary | ICD-10-CM | POA: Diagnosis not present

## 2017-04-12 DIAGNOSIS — R339 Retention of urine, unspecified: Secondary | ICD-10-CM | POA: Diagnosis not present

## 2017-04-15 ENCOUNTER — Other Ambulatory Visit: Payer: Self-pay

## 2017-04-21 ENCOUNTER — Telehealth: Payer: Self-pay | Admitting: Rheumatology

## 2017-04-21 NOTE — Telephone Encounter (Signed)
Patient left a message 3/22 stating she went to Mauri Reading at [facility]. She was told she had dry eyes, but nothing else. If we need records, patient states we can request them from eye care center, and they would send them. Patient states they did feel like dry eyes could cause some sight issues.

## 2017-04-22 ENCOUNTER — Other Ambulatory Visit: Payer: Self-pay

## 2017-04-25 MED ORDER — POTASSIUM CHLORIDE CRYS ER 20 MEQ PO TBCR: 20.0000 meq | EXTENDED_RELEASE_TABLET | Freq: Every day | ORAL | 3 refills | Status: DC

## 2017-05-04 ENCOUNTER — Telehealth: Payer: Self-pay

## 2017-05-04 MED ORDER — LISINOPRIL 20 MG PO TABS: 20.0000 mg | ORAL_TABLET | Freq: Every day | ORAL | 3 refills | Status: DC

## 2017-05-04 NOTE — Telephone Encounter (Signed)
Rx(s) sent to pharmacy electronically.  

## 2017-05-04 NOTE — Telephone Encounter (Signed)
-----   Message from Sanda Klein, MD sent at 05/04/2017  2:01 PM EDT ----- Caren Griffins has a new rash and that might be the losartan.  Let's stop it and put her on lisinopril 20 mg once daily, please ask Erasmo Downer or [place] to check her blood pressure next week. Thanks [employer]

## 2017-05-11 ENCOUNTER — Ambulatory Visit (INDEPENDENT_AMBULATORY_CARE_PROVIDER_SITE_OTHER): Payer: 59 | Admitting: Orthopedic Surgery

## 2017-05-11 ENCOUNTER — Encounter (INDEPENDENT_AMBULATORY_CARE_PROVIDER_SITE_OTHER): Payer: Self-pay | Admitting: Orthopedic Surgery

## 2017-05-11 VITALS — BP 126/68 | HR 68 | Resp 16 | Ht 63.5 in | Wt 270.0 lb

## 2017-05-11 DIAGNOSIS — M1712 Unilateral primary osteoarthritis, left knee: Secondary | ICD-10-CM

## 2017-05-11 MED ORDER — DICLOFENAC SODIUM 1 % TD GEL: 2.0000 g | Freq: Three times a day (TID) | TRANSDERMAL | 1 refills | Status: DC | PRN

## 2017-05-11 MED ORDER — METHYLPREDNISOLONE ACETATE 40 MG/ML IJ SUSP
80.0000 mg | INTRAMUSCULAR | Status: AC | PRN
  Administered 2017-05-11: 80 mg

## 2017-05-11 MED ORDER — BUPIVACAINE HCL 0.5 % IJ SOLN
2.0000 mL | INTRAMUSCULAR | Status: AC | PRN
  Administered 2017-05-11: 2 mL via INTRA_ARTICULAR

## 2017-05-11 MED ORDER — LIDOCAINE HCL 1 % IJ SOLN
2.0000 mL | INTRAMUSCULAR | Status: AC | PRN
  Administered 2017-05-11: 2 mL

## 2017-05-11 NOTE — Progress Notes (Signed)
Q   Office Visit Note   Patient: [name]           Date of Birth: [birth year]           MRN: [id number] Visit Date: 05/11/2017              Requested by: Velna Hatchet, MD [address] [place], [place] [address] PCP: Velna Hatchet, MD   Assessment & Plan: Visit Diagnoses:  1. Unilateral primary osteoarthritis, left knee     Plan:  #1: Corticosteroid injection to the left knee with improvement in pain #2: Follow back up as needed #3: She will try Voltaren gel when she is not taking her meloxicam  Follow-Up Instructions: No follow-ups on file.   Orders:  Orders Placed This Encounter  Procedures  . Large Joint Inj: L knee   Meds ordered this encounter  Medications  . diclofenac sodium (VOLTAREN) 1 % GEL    Sig: Apply 2-4 g topically 3 (three) times daily as needed. Not to be used with meloxicam    Dispense:  3 Tube    Refill:  1    Order Specific Question:   Supervising Provider    Answer:   Garald Balding [[id number]]      Procedures: Large Joint Inj: L knee on 05/11/2017 1:34 PM Indications: pain and diagnostic evaluation Details: 25 G 1.5 in needle, anteromedial approach  Arthrogram: No  Medications: 2 mL lidocaine 1 %; 2 mL bupivacaine 0.5 %; 80 mg methylPREDNISolone acetate 40 MG/ML Procedure, treatment alternatives, risks and benefits explained, specific risks discussed. Consent was given by the patient. Patient was prepped and draped in the usual sterile fashion.       Clinical Data: No additional findings.   Subjective: Chief Complaint  Patient presents with  . Left Knee - Pain  . Knee Pain    Left knee pain x 1 month, swelling, popping, clicking, grinding noises, difficulty walking, difficulty sleeping at night, no injury, not diabetic, no surgery to left knee,     HPI  [name] is a very pleasant 57 year old female seen today for evaluation of her left knee.  She has had left knee pain for several years now.  X-rays dated back in May  2018 revealed advanced osteoarthritis of both knees.  She had a recent cortisone injection in December and had done well for short period of time.  She would like to consider another corticosteroid injection.  She is also had evaluation of flex agenic which was only minimal effective.  She has had a gastric sleeve procedure and has lost 40 pounds.    Review of Systems  Constitutional: Positive for activity change.  HENT: Negative for trouble swallowing.   Eyes: Negative for pain.  Respiratory: Positive for shortness of breath.   Cardiovascular: Positive for leg swelling.  Gastrointestinal: Positive for constipation.  Endocrine: Negative for cold intolerance.  Genitourinary: Negative for difficulty urinating.  Musculoskeletal: Positive for joint swelling.  Skin: Positive for rash.  Allergic/Immunologic: Negative for food allergies.  Neurological: Negative for weakness.  Hematological: Bruises/bleeds easily.  Psychiatric/Behavioral: Positive for sleep disturbance.     Objective: Vital Signs: BP 126/68 (BP Location: Right Arm, Patient Position: Sitting, Cuff Size: Normal)   Pulse 68   Resp 16   Ht 5' 3.5" (1.613 m)   Wt 270 lb (122.5 kg)   BMI 47.08 kg/m   Physical Exam  Constitutional: She is oriented to person, place, and time. She appears well-developed and well-nourished.  HENT:  Head: Normocephalic and atraumatic.  Eyes: Pupils are equal, round, and reactive to light. EOM are normal.  Pulmonary/Chest: Effort normal.  Neurological: She is alert and oriented to person, place, and time.  Skin: Skin is warm and dry.  Psychiatric: She has a normal mood and affect. Her behavior is normal. Judgment and thought content normal.    Ortho Exam  Exam today is quite difficult secondary to the size of her knee.  She does have tenderness to palpation along the medial joint line more than lateral.  Unable to determine whether she has an effusion.  She does have near full extension  and flexes about 95 degrees.  She appears to have more varus positioning of the knee.  Neurovascular intact distally.   Specialty Comments:  No specialty comments available.  Imaging: No results found.   PMFS History: Patient Active Problem List   Diagnosis Date Noted  . Bilateral primary osteoarthritis of knee 12/27/2016  . 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 Prediabetes 12/03/2015  . OSA on CPAP 02/19/2015  . Morbid obesity ([place]) 10/09/2014  . Pulmonary HTN ([place]) 05/12/2013  . Bilateral lower extremity edema 05/12/2013  . Morbid obesity with BMI of 50.0-59.9, adult ([place])   . Back pain, hx of prior back surgery 12/09/2011  . Pancreatitis, mild, CT negative 12/08/2011  . Fatty liver 12/08/2011  . Substernal chest pain, suspected secondary to acute pancreatitis 12/06/2011  . Hypertension 12/06/2011  . Hypokalemia 12/06/2011  . Hypothyroidism 12/06/2011  . GERD (gastroesophageal reflux disease) 12/06/2011  . Anxiety 12/06/2011  . BV (bacterial vaginosis)   . Hyperlipidemia    Past Medical History:  Diagnosis Date  . Anemia when she was young  . Arthritis   . Congestion of nasal sinus   . Constipation    takes Colace every other day  . DDD (degenerative disc disease), lumbar   . Diabetes mellitus without complication ([place])    per pt had been diagnosed but then "number started going down so my doctor said i didnt have diabetes"  . Dry eyes    uses Systane Eye drops daily as needed  . GERD (gastroesophageal reflux disease) 12/06/2011   takes Nexium daily  . Headache(784.0)    occasionally-d/t congestion  . History of bronchitis 6-7 yrs ago  . Hyperlipidemia   . Hypertension    takes Metoprolol and Diovan daily  . Hypothyroidism 12/06/2011   takes Synthroid daily  . Joint pain   . Multiple allergies    takes Zyrtec daily;uses Nasonex daily as needed  . Nocturia   . Numbness    weakness-right hand  . OSA on CPAP   . Pancreatitis   .  Peripheral edema    takes Furosemide daily  . Shingles   . Urinary frequency   . Venous insufficiency of leg     Family History  Problem Relation Age of Onset  . Hypertension Mother   . Hypertension Father   . Hypertension Sister   . Kidney disease Sister        [facility] TRANSPLANT  . Thyroid disease Sister   . Thyroid disease Sister   . Hypertension Sister   . Breast cancer Maternal Aunt        >50; passed away from it  . Breast cancer Maternal Aunt        >50    Past Surgical History:  Procedure Laterality Date  . ABDOMINAL HYSTERECTOMY     uterine prolapse  . BACK SURGERY  fusion  . CARDIAC SURGERY    . CHOLECYSTECTOMY    . COLONOSCOPY    . ESOPHAGOGASTRODUODENOSCOPY  5-44yrs ago  . FOOT SURGERY Bilateral   . KNEE ARTHROSCOPY    . LAPAROSCOPIC GASTRIC SLEEVE RESECTION N/A 08/23/2016   Procedure: LAPAROSCOPIC GASTRIC SLEEVE RESECTION WITH UPPER ENDO;  Surgeon: Johnathan Hausen, MD;  [facility]: [facility];  Service: General;  Laterality: N/A;  . SHOULDER ARTHROSCOPY WITH ROTATOR CUFF REPAIR AND SUBACROMIAL DECOMPRESSION Right 08/23/2013   Procedure: RIGHT SHOULDER ARTHROSCOPY WITH  SUBACROMIAL DECOMPRESSION DISTAL CLAVICLE RESECTION AND  ROTATOR CUFF REPAIR ;  Surgeon: Marin Shutter, MD;  Location: [facility];  Service: Orthopedics;  Laterality: Right;  . TUBAL LIGATION     Social History   Occupational History    Employer: [employer]    Comment: [employer]  Tobacco Use  . Smoking status: Former Smoker    Packs/day: 0.75    Years: 40.00    Pack years: 30.00    Types: Cigarettes    Last attempt to quit: 1993    Years since quitting: 26.3  . Smokeless tobacco: Never Used  . Tobacco comment: quit smoking in 1993  Substance and Sexual Activity  . Alcohol use: Yes    Alcohol/week: 0.0 oz    Comment: rarely;; mixed drink   . Drug use: No  . Sexual activity: Yes    Birth control/protection: Surgical

## 2017-05-12 ENCOUNTER — Ambulatory Visit (INDEPENDENT_AMBULATORY_CARE_PROVIDER_SITE_OTHER): Payer: 59 | Admitting: Pharmacist Clinician (PhC)/ Clinical Pharmacy Specialist

## 2017-05-12 VITALS — BP 124/82 | HR 72

## 2017-05-12 DIAGNOSIS — I10 Essential (primary) hypertension: Secondary | ICD-10-CM | POA: Diagnosis not present

## 2017-05-12 NOTE — Progress Notes (Signed)
05/12/2017 [name] 02-27-1960 [id number]   HPI:  [name] is a 57 y.o. female patient of Dr Sallyanne Kuster, with a PMH below who presents today for hypertension clinic evaluation.  In addition to hypertension, her medical history is significant for hyperlipidemia, OSA (on CPAP), pulmonary hypertension, GERD, hypothyroidism and obesity.  She had gastric sleeve surgery last July and has lost 40 pounds so far.   She feels like the weight loss has slowed up some recently because of osteoarthritis in her knees preventing her from walking much.  Currently she takes meloxicam 15 mg daily and states that without it she has difficulty moving about.    Today she has no complaints of chest pain or shortness of breath.  She does endorse occasional tightness across her chest, but states that it has never been worrisome.  Some swelling in her legs, although she reports that it is much improved recently and has not used furosemide lately.  Blood Pressure Goal:  130/80  Current Medications:  Lisinopril 20 mg qd   Metoprolol 25 mg bid   Furosemide 60 mg prn swelling  Family Hx:  Father died at 29, alcoholism  Mother still living at 14, some heart disease, but doing well  1 sister with hypertension since in her 32s  2 children 35,31, both with hypertension  Social Hx:  No tobacco, quit in 44; occasional social drink (wine, occasional margarita); 1 cup of coffee per day; quit sodas after surgery  Diet:  Gets lunch at work, occasionally eats out 2-3 times per week; breakfast is [place] sausage, peanut butter crackers  Exercise:  Water aerobics - sometimes; unable to do much right now due to arthritic knees  Home BP readings:  No home cuff; works in our office  Intolerances:   Developed rash after starting losartan and new eye drop at same time.   Previously on irbesartan and valsartan - switched d/t recall shortages   CrCl cannot be calculated (Patient's most recent lab result  is older than the maximum 21 days allowed.).  Wt Readings from Last 3 Encounters:  05/11/17 270 lb (122.5 kg)  01/27/17 275 lb (124.7 kg)  12/27/16 274 lb (124.3 kg)   BP Readings from Last 3 Encounters:  05/12/17 124/82  05/11/17 126/68  12/27/16 (!) 153/82   Pulse Readings from Last 3 Encounters:  05/12/17 72  05/11/17 68  12/27/16 85    Current Outpatient Medications  Medication Sig Dispense Refill  . albuterol (PROVENTIL HFA;VENTOLIN HFA) 108 (90 BASE) MCG/ACT inhaler Inhale 1-2 puffs into the lungs every 6 (six) hours as needed for wheezing or shortness of breath. 1 Inhaler 2  . aspirin EC 81 MG tablet Take 81 mg by mouth daily.     . Biotin 5000 MCG CAPS Take 5,000 mcg by mouth daily.    . diazepam (VALIUM) 5 MG tablet Take 0.5-1 tablets (2.5-5 mg total) by mouth every 6 (six) hours as needed for muscle spasms or sedation. (Patient not taking: Reported on 05/11/2017) 40 tablet 1  . diclofenac sodium (VOLTAREN) 1 % GEL Apply 2-4 g topically 3 (three) times daily as needed. Not to be used with meloxicam 3 Tube 1  . docusate sodium (COLACE) 100 MG capsule Take 100 mg by mouth daily.     Marland Kitchen esomeprazole (NEXIUM) 40 MG capsule Take 40 mg by mouth daily.     . fluticasone (FLONASE) 50 MCG/ACT nasal spray Place 1 spray into both nostrils daily as needed for  allergies.   1  . furosemide (LASIX) 20 MG tablet Take 3 tablets (60 mg total) by mouth daily. NEED OV. (Patient taking differently: Take 60 mg by mouth as needed. NEED OV.) 270 tablet 0  . levothyroxine (SYNTHROID, LEVOTHROID) 75 MCG tablet Take 75 mcg by mouth daily before breakfast.    . lisinopril (PRINIVIL,ZESTRIL) 20 MG tablet Take 1 tablet (20 mg total) by mouth daily. 90 tablet 3  . loratadine (CLARITIN) 10 MG tablet Take 10 mg by mouth daily.    . magnesium oxide (MAG-OX) 400 MG tablet Take 400 mg by mouth daily.    . meloxicam (MOBIC) 15 MG tablet   3  . metoprolol tartrate (LOPRESSOR) 25 MG tablet Take 25 mg by mouth 2  (two) times daily.    . Omega-3 Fatty Acids (FISH OIL) 1000 MG CAPS Take 1,000 mg by mouth daily.    . ondansetron (ZOFRAN ODT) 4 MG disintegrating tablet Take 1 tablet (4 mg total) by mouth every 8 (eight) hours as needed for nausea or vomiting. 20 tablet 0  . Polyethyl Glycol-Propyl Glycol (SYSTANE OP) Place 1 drop into both eyes daily as needed (dry eyes).     . potassium chloride 20 MEQ/15ML (10%) SOLN Take 15 mLs (20 mEq total) by mouth daily. (Patient not taking: Reported on 05/11/2017) 450 mL 0  . potassium chloride SA (KLOR-CON M20) 20 MEQ tablet Take 1 tablet (20 mEq total) by mouth daily. (Patient taking differently: Take 20 mEq by mouth as needed. ) 90 tablet 3  . pregabalin (LYRICA) 150 MG capsule TAKE ONE CAPSULE BY MOUTH 2 TIMES A DAY 180 capsule 4   No current facility-administered medications for this visit.     Allergies  Allergen Reactions  . Metronidazole     Developed pancreatitis after taking this  . Adhesive [Tape] Itching and Rash  . Latex Itching and Rash  . Penicillins Rash    Has patient had a PCN reaction causing immediate rash, facial/tongue/throat swelling, SOB or lightheadedness with hypotension: No Has patient had a PCN reaction causing severe rash involving mucus membranes or skin necrosis: No Has patient had a PCN reaction that required hospitalization No Has patient had a PCN reaction occurring within the last 10 years: No If all of the above answers are "NO", then may proceed with Cephalosporin use.  Marland Kitchen Vesicare [Solifenacin] Rash    Past Medical History:  Diagnosis Date  . Anemia when she was young  . Arthritis   . Congestion of nasal sinus   . Constipation    takes Colace every other day  . DDD (degenerative disc disease), lumbar   . Diabetes mellitus without complication ([place])    per pt had been diagnosed but then "number started going down so my doctor said i didnt have diabetes"  . Dry eyes    uses Systane Eye drops daily as needed  . GERD  (gastroesophageal reflux disease) 12/06/2011   takes Nexium daily  . Headache(784.0)    occasionally-d/t congestion  . History of bronchitis 6-7 yrs ago  . Hyperlipidemia   . Hypertension    takes Metoprolol and Diovan daily  . Hypothyroidism 12/06/2011   takes Synthroid daily  . Joint pain   . Multiple allergies    takes Zyrtec daily;uses Nasonex daily as needed  . Nocturia   . Numbness    weakness-right hand  . OSA on CPAP   . Pancreatitis   . Peripheral edema    takes Furosemide daily  .  Shingles   . Urinary frequency   . Venous insufficiency of leg     Blood pressure 124/82, pulse 72.  Hypertension Patient with essential hypertension, apparently well controlled on lisinopril and metoprolol.  I will have her repeat BMET, as her last was in October.   Otherwise she will continue with current medications and stop in the CVRR clinic twice weekly for the next month to monitor blood pressure.    Tommy Medal PharmD CPP [facility] [address] [place], [place] [address] [phone]

## 2017-05-12 NOTE — Patient Instructions (Addendum)
  Your blood pressure today is 124/82  Check your blood pressure at the office twice weekly for about a month  Take your BP meds as follows:  Continue all current medications  Bring all of your meds, your BP cuff and your record of home blood pressures to your next appointment.  Exercise as you're able, try to walk approximately 30 minutes per day.  Keep salt intake to a minimum, especially watch canned and prepared boxed foods.  Eat more fresh fruits and vegetables and fewer canned items.  Avoid eating in fast food restaurants.    HOW TO TAKE YOUR BLOOD PRESSURE: . Rest 5 minutes before taking your blood pressure. .  Don't smoke or drink caffeinated beverages for at least 30 minutes before. . Take your blood pressure before (not after) you eat. . Sit comfortably with your back supported and both feet on the floor (don't cross your legs). . Elevate your arm to heart level on a table or a desk. . Use the proper sized cuff. It should fit smoothly and snugly around your bare upper arm. There should be enough room to slip a fingertip under the cuff. The bottom edge of the cuff should be 1 inch above the crease of the elbow. . Ideally, take 3 measurements at one sitting and record the average.

## 2017-05-12 NOTE — Assessment & Plan Note (Signed)
Patient with essential hypertension, apparently well controlled on lisinopril and metoprolol.  I will have her repeat BMET, as her last was in October.   Otherwise she will continue with current medications and stop in the CVRR clinic twice weekly for the next month to monitor blood pressure.

## 2017-05-13 LAB — BASIC METABOLIC PANEL
BUN/Creatinine Ratio: 16 (ref 9–23)
BUN: 14 mg/dL (ref 6–24)
CHLORIDE: 103 mmol/L (ref 96–106)
CO2: 27 mmol/L (ref 20–29)
Calcium: 9.3 mg/dL (ref 8.7–10.2)
Creatinine, Ser: 0.85 mg/dL (ref 0.57–1.00)
GFR, EST AFRICAN AMERICAN: 88 mL/min/{1.73_m2} (ref >59)
GFR, EST NON AFRICAN AMERICAN: 76 mL/min/{1.73_m2} (ref >59)
Glucose: 77 mg/dL (ref 65–99)
POTASSIUM: 5 mmol/L (ref 3.5–5.2)
SODIUM: 143 mmol/L (ref 134–144)

## 2017-06-03 DIAGNOSIS — R05 Cough: Secondary | ICD-10-CM | POA: Diagnosis not present

## 2017-06-03 DIAGNOSIS — Z6841 Body Mass Index (BMI) 40.0 and over, adult: Secondary | ICD-10-CM | POA: Diagnosis not present

## 2017-06-03 DIAGNOSIS — J181 Lobar pneumonia, unspecified organism: Secondary | ICD-10-CM | POA: Diagnosis not present

## 2017-06-08 ENCOUNTER — Encounter: Payer: Self-pay | Admitting: Obstetrics and Gynecology

## 2017-06-08 DIAGNOSIS — Z0000 Encounter for general adult medical examination without abnormal findings: Secondary | ICD-10-CM | POA: Diagnosis not present

## 2017-06-13 ENCOUNTER — Encounter (INDEPENDENT_AMBULATORY_CARE_PROVIDER_SITE_OTHER): Payer: Self-pay | Admitting: Orthopaedic Surgery

## 2017-06-13 ENCOUNTER — Ambulatory Visit (INDEPENDENT_AMBULATORY_CARE_PROVIDER_SITE_OTHER): Payer: 59 | Admitting: Orthopaedic Surgery

## 2017-06-13 VITALS — BP 120/81 | HR 56 | Resp 18 | Ht 63.0 in | Wt 267.0 lb

## 2017-06-13 DIAGNOSIS — M1710 Unilateral primary osteoarthritis, unspecified knee: Secondary | ICD-10-CM | POA: Diagnosis not present

## 2017-06-13 DIAGNOSIS — M170 Bilateral primary osteoarthritis of knee: Secondary | ICD-10-CM

## 2017-06-13 MED ORDER — LIDOCAINE HCL 1 % IJ SOLN
2.0000 mL | INTRAMUSCULAR | Status: AC | PRN
  Administered 2017-06-13: 2 mL

## 2017-06-13 MED ORDER — BUPIVACAINE HCL 0.5 % IJ SOLN
2.0000 mL | INTRAMUSCULAR | Status: AC | PRN
  Administered 2017-06-13: 2 mL via INTRA_ARTICULAR

## 2017-06-13 MED ORDER — METHYLPREDNISOLONE ACETATE 40 MG/ML IJ SUSP
80.0000 mg | INTRAMUSCULAR | Status: AC | PRN
  Administered 2017-06-13: 80 mg

## 2017-06-13 NOTE — Progress Notes (Signed)
Office Visit Note   Patient: [name]           Date of Birth: 1960-08-05           MRN: [id number] Visit Date: 06/13/2017              Requested by: Stacey Hatchet, MD [address] [place], [place] [address] PCP: Stacey Hatchet, MD   Assessment & Plan: Visit Diagnoses:  1. Bilateral primary osteoarthritis of knee     Plan: End-stage osteoarthritis both knees.  Injection left knee a month ago.  Patient wishes to have an injection of the right knee.  This was performed without difficulty.  Will precertify Visco supplementation after long discussion.  Also had long discussion regarding her weight with a BMI of 47  Follow-Up Instructions: Return if symptoms worsen or fail to improve.   Orders:  Orders Placed This Encounter  Procedures  . Large Joint Inj: R knee   No orders of the defined types were placed in this encounter.     Procedures: Large Joint Inj: R knee on 06/13/2017 10:11 AM Indications: pain and diagnostic evaluation Details: 25 G 1.5 in needle, anteromedial approach  Arthrogram: No  Medications: 2 mL lidocaine 1 %; 2 mL bupivacaine 0.5 %; 80 mg methylPREDNISolone acetate 40 MG/ML Procedure, treatment alternatives, risks and benefits explained, specific risks discussed. Consent was given by the patient. Immediately prior to procedure a time out was called to verify the correct patient, procedure, equipment, support staff and site/side marked as required. Patient was prepped and draped in the usual sterile fashion.       Clinical Data: No additional findings.   Subjective: Chief Complaint  Patient presents with  . Right Knee - Pain  . New Patient (Initial Visit)    RIGHT KNEE PAIN, GETTING WORSE THE LAST TWO WEEKS NO INJURY. LAST KNEE INJ IN RIGHT KNEE ABOUT 7 MO AGO  History of bilateral advanced osteoarthritis of the knees.  Cortisone injection last month left knee with good relief.  Wishes to have a cortisone injection right knee.  Mrs.  [name] is "prediabetic"  HPI  Review of Systems  Constitutional: Negative for fatigue and fever.  HENT: Negative for ear pain.   Eyes: Negative for pain.  Respiratory: Negative for cough and shortness of breath.   Cardiovascular: Negative for leg swelling.  Gastrointestinal: Positive for diarrhea. Negative for constipation.  Genitourinary: Negative for difficulty urinating.  Musculoskeletal: Positive for back pain. Negative for neck pain.  Skin: Positive for rash.  Allergic/Immunologic: Negative for food allergies.  Neurological: Negative for weakness and numbness.  Hematological: Bruises/bleeds easily.  Psychiatric/Behavioral: Positive for sleep disturbance.     Objective: Vital Signs: BP 120/81 (BP Location: Left Arm, Patient Position: Sitting, Cuff Size: Normal)   Pulse (!) 56   Resp 18   Ht 5\' 3"  (1.6 m)   Wt 267 lb (121.1 kg)   BMI 47.30 kg/m   Physical Exam  Constitutional: She is oriented to person, place, and time. She appears well-developed and well-nourished.  HENT:  Mouth/Throat: Oropharynx is clear and moist.  Eyes: Pupils are equal, round, and reactive to light. EOM are normal.  Pulmonary/Chest: Effort normal.  Neurological: She is alert and oriented to person, place, and time.  Skin: Skin is warm and dry.  Psychiatric: She has a normal mood and affect. Her behavior is normal.    Ortho Exam awake alert and oriented x3.  Comfortable sitting.  Large knees.  Full extension right  knee flexed about 100 degrees.  No instability.  Predominately medial joint pain today.  Difficult to tell if there was an effusion.  No instability.  No calf pain.  No obvious distal edema.  Neurovascular exam intact  Specialty Comments:  No specialty comments available.  Imaging: No results found.   PMFS History: Patient Active Problem List   Diagnosis Date Noted  . Bilateral primary osteoarthritis of knee 12/27/2016  . 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 Prediabetes 12/03/2015  . OSA on CPAP 02/19/2015  . Morbid obesity ([place]) 10/09/2014  . Pulmonary HTN ([place]) 05/12/2013  . Bilateral lower extremity edema 05/12/2013  . Morbid obesity with BMI of 50.0-59.9, adult ([place])   . Back pain, hx of prior back surgery 12/09/2011  . Pancreatitis, mild, CT negative 12/08/2011  . Fatty liver 12/08/2011  . Substernal chest pain, suspected secondary to acute pancreatitis 12/06/2011  . Hypertension 12/06/2011  . Hypokalemia 12/06/2011  . Hypothyroidism 12/06/2011  . GERD (gastroesophageal reflux disease) 12/06/2011  . Anxiety 12/06/2011  . BV (bacterial vaginosis)   . Hyperlipidemia    Past Medical History:  Diagnosis Date  . Anemia when she was young  . Arthritis   . Congestion of nasal sinus   . Constipation    takes Colace every other day  . DDD (degenerative disc disease), lumbar   . Diabetes mellitus without complication ([place])    per pt had been diagnosed but then "number started going down so my doctor said i didnt have diabetes"  . Dry eyes    uses Systane Eye drops daily as needed  . GERD (gastroesophageal reflux disease) 12/06/2011   takes Nexium daily  . Headache(784.0)    occasionally-d/t congestion  . History of bronchitis 6-7 yrs ago  . Hyperlipidemia   . Hypertension    takes Metoprolol and Diovan daily  . Hypothyroidism 12/06/2011   takes Synthroid daily  . Joint pain   . Multiple allergies    takes Zyrtec daily;uses Nasonex daily as needed  . Nocturia   . Numbness    weakness-right hand  . OSA on CPAP   . Pancreatitis   . Peripheral edema    takes Furosemide daily  . Shingles   . Urinary frequency   . Venous insufficiency of leg     Family History  Problem Relation Age of Onset  . Hypertension Mother   . Hypertension Father   . Hypertension Sister   . Kidney disease Sister        [facility] TRANSPLANT  . Thyroid disease Sister   . Thyroid disease Sister   . Hypertension Sister   . Breast cancer Maternal  Aunt        >50; passed away from it  . Breast cancer Maternal Aunt        >50    Past Surgical History:  Procedure Laterality Date  . ABDOMINAL HYSTERECTOMY     uterine prolapse  . BACK SURGERY     fusion  . CARDIAC SURGERY    . CHOLECYSTECTOMY    . COLONOSCOPY    . ESOPHAGOGASTRODUODENOSCOPY  5-33yrs ago  . FOOT SURGERY Bilateral   . KNEE ARTHROSCOPY    . LAPAROSCOPIC GASTRIC SLEEVE RESECTION N/A 08/23/2016   Procedure: LAPAROSCOPIC GASTRIC SLEEVE RESECTION WITH UPPER ENDO;  Surgeon: Johnathan Hausen, MD;  Location: [facility];  Service: General;  Laterality: N/A;  . SHOULDER ARTHROSCOPY WITH ROTATOR CUFF REPAIR AND SUBACROMIAL DECOMPRESSION Right 08/23/2013   Procedure: RIGHT SHOULDER  ARTHROSCOPY WITH  SUBACROMIAL DECOMPRESSION DISTAL CLAVICLE RESECTION AND  ROTATOR CUFF REPAIR ;  Surgeon: Marin Shutter, MD;  Location: [facility];  Service: Orthopedics;  Laterality: Right;  . TUBAL LIGATION     Social History   Occupational History    Employer: [employer]    Comment: [employer]  Tobacco Use  . Smoking status: Former Smoker    Packs/day: 0.75    Years: 40.00    Pack years: 30.00    Types: Cigarettes    Last attempt to quit: 1993    Years since quitting: 26.3  . Smokeless tobacco: Never Used  . Tobacco comment: quit smoking in 1993  Substance and Sexual Activity  . Alcohol use: Yes    Alcohol/week: 0.0 oz    Comment: rarely;; mixed drink   . Drug use: No  . Sexual activity: Yes    Birth control/protection: Surgical

## 2017-06-14 DIAGNOSIS — M25561 Pain in right knee: Secondary | ICD-10-CM | POA: Diagnosis not present

## 2017-06-14 DIAGNOSIS — J181 Lobar pneumonia, unspecified organism: Secondary | ICD-10-CM | POA: Diagnosis not present

## 2017-06-14 DIAGNOSIS — E1169 Type 2 diabetes mellitus with other specified complication: Secondary | ICD-10-CM | POA: Diagnosis not present

## 2017-06-14 DIAGNOSIS — E7849 Other hyperlipidemia: Secondary | ICD-10-CM | POA: Diagnosis not present

## 2017-06-14 DIAGNOSIS — E038 Other specified hypothyroidism: Secondary | ICD-10-CM | POA: Diagnosis not present

## 2017-06-14 DIAGNOSIS — Z23 Encounter for immunization: Secondary | ICD-10-CM | POA: Diagnosis not present

## 2017-06-14 DIAGNOSIS — Z0000 Encounter for general adult medical examination without abnormal findings: Secondary | ICD-10-CM | POA: Diagnosis not present

## 2017-06-14 DIAGNOSIS — I872 Venous insufficiency (chronic) (peripheral): Secondary | ICD-10-CM | POA: Diagnosis not present

## 2017-06-14 DIAGNOSIS — Z1389 Encounter for screening for other disorder: Secondary | ICD-10-CM | POA: Diagnosis not present

## 2017-06-14 DIAGNOSIS — I10 Essential (primary) hypertension: Secondary | ICD-10-CM | POA: Diagnosis not present

## 2017-06-14 DIAGNOSIS — M461 Sacroiliitis, not elsewhere classified: Secondary | ICD-10-CM | POA: Diagnosis not present

## 2017-06-17 ENCOUNTER — Telehealth (INDEPENDENT_AMBULATORY_CARE_PROVIDER_SITE_OTHER): Payer: Self-pay | Admitting: Orthopaedic Surgery

## 2017-06-17 NOTE — Telephone Encounter (Signed)
Records release form faxed to patient, per her request [phone]

## 2017-06-21 ENCOUNTER — Encounter: Payer: Self-pay | Admitting: Obstetrics and Gynecology

## 2017-06-21 DIAGNOSIS — R61 Generalized hyperhidrosis: Secondary | ICD-10-CM | POA: Diagnosis not present

## 2017-06-22 ENCOUNTER — Telehealth (INDEPENDENT_AMBULATORY_CARE_PROVIDER_SITE_OTHER): Payer: Self-pay | Admitting: Orthopaedic Surgery

## 2017-06-22 NOTE — Telephone Encounter (Signed)
Patient called checking if we received her signed release form. I told her that I have not seen anything.  I gave her [phone] fax and she will refax it

## 2017-06-28 ENCOUNTER — Other Ambulatory Visit: Payer: Self-pay

## 2017-06-28 ENCOUNTER — Ambulatory Visit: Payer: 59 | Admitting: Obstetrics and Gynecology

## 2017-06-28 ENCOUNTER — Encounter: Payer: Self-pay | Admitting: Obstetrics and Gynecology

## 2017-06-28 ENCOUNTER — Ambulatory Visit (INDEPENDENT_AMBULATORY_CARE_PROVIDER_SITE_OTHER): Payer: 59 | Admitting: Obstetrics and Gynecology

## 2017-06-28 VITALS — BP 128/64 | HR 56 | Resp 18 | Ht 62.75 in | Wt 271.0 lb

## 2017-06-28 DIAGNOSIS — R232 Flushing: Secondary | ICD-10-CM

## 2017-06-28 DIAGNOSIS — N763 Subacute and chronic vulvitis: Secondary | ICD-10-CM | POA: Diagnosis not present

## 2017-06-28 DIAGNOSIS — Z01419 Encounter for gynecological examination (general) (routine) without abnormal findings: Secondary | ICD-10-CM

## 2017-06-28 MED ORDER — GABAPENTIN 100 MG PO CAPS: 100.0000 mg | ORAL_CAPSULE | Freq: Three times a day (TID) | ORAL | 1 refills | Status: DC

## 2017-06-28 MED ORDER — BETAMETHASONE VALERATE 0.1 % EX OINT: TOPICAL_OINTMENT | CUTANEOUS | 0 refills | Status: DC

## 2017-06-28 NOTE — Progress Notes (Signed)
57 y.o. [id number] DivorcedAfrican AmericanF here for annual exam.  H/O Hysterectomy in her early 13's, still has her ovaries. She c/o vasomotor symptoms for the last 5-6 years, worse in the last month. Having hot flashes most days, can last for a while. Not currently having night sweats.  She c/o of a discoloration of her vulva whitening, has itching off and on. She has a cream to use. This has been going on for a long time. Not sexually active.      No LMP recorded. Patient has had a hysterectomy.          Sexually active: No.  The current method of family planning is status post hysterectomy.    Exercising: No.  The patient does not participate in regular exercise at present. Smoker:  Former smoker  Health Maintenance: Pap:  2015 WNL per patient  History of abnormal Pap:  no MMG:  11-23-16 WNL  Colonoscopy:  2013 normal per patient  BMD:   unsure TDaP:  03-25-13 Gardasil: N/A   reports that she quit smoking about 26 years ago. Her smoking use included cigarettes. She has a 30.00 pack-year smoking history. She has never used smokeless tobacco. She reports that she drinks alcohol. She reports that she does not use drugs. Works in [employer]. 2 kids, local. 3 grandchildren (62, 95 and 56).   Past Medical History:  Diagnosis Date  . Anemia when she was young  . Arthritis   . Congestion of nasal sinus   . Constipation    takes Colace every other day  . DDD (degenerative disc disease), lumbar   . Diabetes mellitus without complication ([place])    per pt had been diagnosed but then "number started going down so my doctor said i didnt have diabetes"  . Dry eyes    uses Systane Eye drops daily as needed  . GERD (gastroesophageal reflux disease) 12/06/2011   takes Nexium daily  . Headache(784.0)    occasionally-d/t congestion  . History of bronchitis 6-7 yrs ago  . Hyperlipidemia   . Hypertension    takes Metoprolol and Diovan daily  . Hypothyroidism 12/06/2011   takes Synthroid  daily  . Joint pain   . Multiple allergies    takes Zyrtec daily;uses Nasonex daily as needed  . Nocturia   . Numbness    weakness-right hand  . OSA on CPAP   . Pancreatitis   . Peripheral edema    takes Furosemide daily  . Shingles   . Urinary frequency   . Venous insufficiency of leg     Past Surgical History:  Procedure Laterality Date  . ABDOMINAL HYSTERECTOMY     uterine prolapse  . BACK SURGERY     fusion  . CARDIAC SURGERY    . CHOLECYSTECTOMY    . COLONOSCOPY    . ESOPHAGOGASTRODUODENOSCOPY  5-51yrs ago  . FOOT SURGERY Bilateral   . KNEE ARTHROSCOPY    . LAPAROSCOPIC GASTRIC SLEEVE RESECTION N/A 08/23/2016   Procedure: LAPAROSCOPIC GASTRIC SLEEVE RESECTION WITH UPPER ENDO;  Surgeon: Johnathan Hausen, MD;  Location: [facility];  Service: General;  Laterality: N/A;  . SHOULDER ARTHROSCOPY WITH ROTATOR CUFF REPAIR AND SUBACROMIAL DECOMPRESSION Right 08/23/2013   Procedure: RIGHT SHOULDER ARTHROSCOPY WITH  SUBACROMIAL DECOMPRESSION DISTAL CLAVICLE RESECTION AND  ROTATOR CUFF REPAIR ;  Surgeon: Marin Shutter, MD;  Location: [facility];  Service: Orthopedics;  Laterality: Right;  . TUBAL LIGATION      Current Outpatient Medications  Medication Sig Dispense Refill  .  albuterol (PROVENTIL HFA;VENTOLIN HFA) 108 (90 BASE) MCG/ACT inhaler Inhale 1-2 puffs into the lungs every 6 (six) hours as needed for wheezing or shortness of breath. 1 Inhaler 2  . aspirin EC 81 MG tablet Take 81 mg by mouth daily.     . Biotin 5000 MCG CAPS Take 5,000 mcg by mouth daily.    . diazepam (VALIUM) 5 MG tablet Take 0.5-1 tablets (2.5-5 mg total) by mouth every 6 (six) hours as needed for muscle spasms or sedation. 40 tablet 1  . docusate sodium (COLACE) 100 MG capsule Take 100 mg by mouth daily.     Marland Kitchen esomeprazole (NEXIUM) 40 MG capsule Take 40 mg by mouth daily.     . fluticasone (FLONASE) 50 MCG/ACT nasal spray Place 1 spray into both nostrils daily as needed for allergies.   1  . furosemide (LASIX) 20  MG tablet Take 3 tablets (60 mg total) by mouth daily. NEED OV. (Patient taking differently: Take 60 mg by mouth as needed. NEED OV.) 270 tablet 0  . levothyroxine (SYNTHROID, LEVOTHROID) 75 MCG tablet Take 75 mcg by mouth daily before breakfast.    . lisinopril (PRINIVIL,ZESTRIL) 20 MG tablet Take 1 tablet (20 mg total) by mouth daily. 90 tablet 3  . loratadine (CLARITIN) 10 MG tablet Take 10 mg by mouth daily.    . magnesium oxide (MAG-OX) 400 MG tablet Take 400 mg by mouth daily.    . meloxicam (MOBIC) 15 MG tablet   3  . metoprolol tartrate (LOPRESSOR) 25 MG tablet Take 25 mg by mouth 2 (two) times daily.    . Omega-3 Fatty Acids (FISH OIL) 1000 MG CAPS Take 1,000 mg by mouth daily.    . ondansetron (ZOFRAN ODT) 4 MG disintegrating tablet Take 1 tablet (4 mg total) by mouth every 8 (eight) hours as needed for nausea or vomiting. 20 tablet 0  . Polyethyl Glycol-Propyl Glycol (SYSTANE OP) Place 1 drop into both eyes daily as needed (dry eyes).     . potassium chloride SA (KLOR-CON M20) 20 MEQ tablet Take 1 tablet (20 mEq total) by mouth daily. (Patient taking differently: Take 20 mEq by mouth as needed. ) 90 tablet 3  . pregabalin (LYRICA) 150 MG capsule TAKE ONE CAPSULE BY MOUTH 2 TIMES A DAY 180 capsule 4  . vitamin C (ASCORBIC ACID) 500 MG tablet Take 500 mg by mouth daily.     No current facility-administered medications for this visit.     Family History  Problem Relation Age of Onset  . Hypertension Mother   . Hypertension Father   . Hypertension Sister   . Kidney disease Sister        [facility] TRANSPLANT  . Thyroid disease Sister   . Thyroid disease Sister   . Hypertension Sister   . Breast cancer Maternal Aunt        >50; passed away from it  . Breast cancer Maternal Aunt        >50  2 out of 4 maternal aunts with breast cancer. Both in their 29's.   Review of Systems  Constitutional: Negative.   HENT: Negative.   Eyes: Negative.   Respiratory: Negative.    Cardiovascular: Negative.   Gastrointestinal: Negative.   Endocrine: Negative.   Genitourinary:       Vaginal itching and irritation   Musculoskeletal: Negative.   Skin: Negative.   Allergic/Immunologic: Negative.   Neurological: Negative.   Psychiatric/Behavioral: Negative.   She c/o that her urinary stream  isn't straight.   Exam:   BP 128/64 (BP Location: Right Wrist, Patient Position: Sitting, Cuff Size: Normal)   Pulse (!) 56   Resp 18   Ht 5' 2.75" (1.594 m)   Wt 271 lb (122.9 kg)   BMI 48.39 kg/m   Weight change: @WEIGHTCHANGE @ Height:   Height: 5' 2.75" (159.4 cm)  Ht Readings from Last 3 Encounters:  06/28/17 5' 2.75" (1.594 m)  06/13/17 5\' 3"  (1.6 m)  05/11/17 5' 3.5" (1.613 m)    General appearance: alert, cooperative and appears stated age Head: Normocephalic, without obvious abnormality, atraumatic Neck: no adenopathy, supple, symmetrical, trachea midline and thyroid normal to inspection and palpation Lungs: clear to auscultation bilaterally Cardiovascular: regular rate and rhythm Breasts: normal appearance, no masses or tenderness Abdomen: soft, non-tender; non distended,  no masses,  no organomegaly Extremities: extremities normal, atraumatic, no cyanosis or edema Skin: Skin color, texture, turgor normal. No rashes or lesions Lymph nodes: Cervical, supraclavicular, and axillary nodes normal. No abnormal inguinal nodes palpated Neurologic: Grossly normal   Pelvic: External genitalia:  Loss of pigmentation on the inner vulva and perianal region. No significant agglutination. On the right inner labia majora, lateral to the loss of pigmentation is a more distinct area of whitening/irritation.               Urethra:  normal appearing urethra with no masses, tenderness or lesions              Bartholins and Skenes: normal                 Vagina: normal appearing vagina with normal color and discharge, no lesions              Cervix: absent                Bimanual Exam:  Uterus:  uterus absent              Adnexa: no mass, fullness, tenderness               Rectovaginal: Confirms               Anus:  normal sphincter tone, no lesions  Chaperone was present for exam.  A:  Well Woman with normal exam  Multiple medical issues, managed by primary and  Cardiology  Hot flashes  Vulvitis  P:   No pap needed  Mammogram UTD  Discussed options for managing her hotflashes. She is not the best candidate for estrogen.   Would like to try gabapentin, will start with 100 mg TID (she will adjust up to TID as needed), f/u in 2 weeks  Discussed breast self exam  Discussed calcium and vit D intake  Labs with primary  Affirm  Treat with steroid ointment  F/U in 2 weeks, if area on inner right labia majora hasn't improved, will biopsy.   Labs from primary reviewed, TSH from mid may was suppressed and from last week was normal. She will discuss this with her primary. Her thyroid being off could contribute to hot flashes.

## 2017-06-28 NOTE — Addendum Note (Signed)
Addended by: Susanne Greenhouse E on: 06/28/2017 02:19 PM   Modules accepted: Orders

## 2017-06-28 NOTE — Patient Instructions (Addendum)
You can try Estroven for hot flashes.   Menopause Menopause is the normal time of life when menstrual periods stop completely. Menopause is complete when you have missed 12 consecutive menstrual periods. It usually occurs between the ages of 35 years and 16 years. Very rarely does a woman develop menopause before the age of 33 years. At menopause, your ovaries stop producing the female hormones estrogen and progesterone. This can cause undesirable symptoms and also affect your health. Sometimes the symptoms may occur 4-5 years before the menopause begins. There is no relationship between menopause and:  Oral contraceptives.  Number of children you had.  Race.  The age your menstrual periods started (menarche).  Heavy smokers and very thin women may develop menopause earlier in life. What are the causes?  The ovaries stop producing the female hormones estrogen and progesterone. Other causes include:  Surgery to remove both ovaries.  The ovaries stop functioning for no known reason.  Tumors of the pituitary gland in the brain.  Medical disease that affects the ovaries and hormone production.  Radiation treatment to the abdomen or pelvis.  Chemotherapy that affects the ovaries.  What are the signs or symptoms?  Hot flashes.  Night sweats.  Decrease in sex drive.  Vaginal dryness and thinning of the vagina causing painful intercourse.  Dryness of the skin and developing wrinkles.  Headaches.  Tiredness.  Irritability.  Memory problems.  Weight gain.  Bladder infections.  Hair growth of the face and chest.  Infertility. More serious symptoms include:  Loss of bone (osteoporosis) causing breaks (fractures).  Depression.  Hardening and narrowing of the arteries (atherosclerosis) causing heart attacks and strokes.  How is this diagnosed?  When the menstrual periods have stopped for 12 straight months.  Physical exam.  Hormone studies of the blood. How  is this treated? There are many treatment choices and nearly as many questions about them. The decisions to treat or not to treat menopausal changes is an individual choice made with your health care provider. Your health care provider can discuss the treatments with you. Together, you can decide which treatment will work best for you. Your treatment choices may include:  Hormone therapy (estrogen and progesterone).  Non-hormonal medicines.  Treating the individual symptoms with medicine (for example antidepressants for depression).  Herbal medicines that may help specific symptoms.  Counseling by a psychiatrist or psychologist.  Group therapy.  Lifestyle changes including: ? Eating healthy. ? Regular exercise. ? Limiting caffeine and alcohol. ? Stress management and meditation.  No treatment.  Follow these instructions at home:  Take the medicine your health care provider gives you as directed.  Get plenty of sleep and rest.  Exercise regularly.  Eat a diet that contains calcium (good for the bones) and soy products (acts like estrogen hormone).  Avoid alcoholic beverages.  Do not smoke.  If you have hot flashes, dress in layers.  Take supplements, calcium, and vitamin D to strengthen bones.  You can use over-the-counter lubricants or moisturizers for vaginal dryness.  Group therapy is sometimes very helpful.  Acupuncture may be helpful in some cases. Contact a health care provider if:  You are not sure you are in menopause.  You are having menopausal symptoms and need advice and treatment.  You are still having menstrual periods after age 54 years.  You have pain with intercourse.  Menopause is complete (no menstrual period for 12 months) and you develop vaginal bleeding.  You need a referral to a specialist (  gynecologist, psychiatrist, or psychologist) for treatment. Get help right away if:  You have severe depression.  You have excessive vaginal  bleeding.  You fell and think you have a broken bone.  You have pain when you urinate.  You develop leg or chest pain.  You have a fast pounding heart beat (palpitations).  You have severe headaches.  You develop vision problems.  You feel a lump in your breast.  You have abdominal pain or severe indigestion. This information is not intended to replace advice given to you by your health care provider. Make sure you discuss any questions you have with your health care provider. Document Released: 04/03/2003 Document Revised: 06/19/2015 Document Reviewed: 08/10/2012 Elsevier Interactive Patient Education  2017 [place] AND DIET:  We recommended that you start or continue a regular exercise program for good health. Regular exercise means any activity that makes your heart beat faster and makes you sweat.  We recommend exercising at least 30 minutes per day at least 3 days a week, preferably 4 or 5.  We also recommend a diet low in fat and sugar.  Inactivity, poor dietary choices and obesity can cause diabetes, heart attack, stroke, and kidney damage, among others.    ALCOHOL AND SMOKING:  Women should limit their alcohol intake to no more than 7 drinks/beers/glasses of wine (combined, not each!) per week. Moderation of alcohol intake to this level decreases your risk of breast cancer and liver damage. And of course, no recreational drugs are part of a healthy lifestyle.  And absolutely no smoking or even second hand smoke. Most people know smoking can cause heart and lung diseases, but did you know it also contributes to weakening of your bones? Aging of your skin?  Yellowing of your teeth and nails?  CALCIUM AND VITAMIN D:  Adequate intake of calcium and Vitamin D are recommended.  The recommendations for exact amounts of these supplements seem to change often, but generally speaking 600 mg of calcium (either carbonate or citrate) and 800 units of Vitamin D per day seems  prudent. Certain women may benefit from higher intake of Vitamin D.  If you are among these women, your doctor will have told you during your visit.    PAP SMEARS:  Pap smears, to check for cervical cancer or precancers,  have traditionally been done yearly, although recent scientific advances have shown that most women can have pap smears less often.  However, every woman still should have a physical exam from her gynecologist every year. It will include a breast check, inspection of the vulva and vagina to check for abnormal growths or skin changes, a visual exam of the cervix, and then an exam to evaluate the size and shape of the uterus and ovaries.  And after 57 years of age, a rectal exam is indicated to check for rectal cancers. We will also provide age appropriate advice regarding health maintenance, like when you should have certain vaccines, screening for sexually transmitted diseases, bone density testing, colonoscopy, mammograms, etc.   MAMMOGRAMS:  All women over 15 years old should have a yearly mammogram. Many facilities now offer a "3D" mammogram, which may cost around $50 extra out of pocket. If possible,  we recommend you accept the option to have the 3D mammogram performed.  It both reduces the number of women who will be called back for extra views which then turn out to be normal, and it is better than the routine mammogram at detecting  truly abnormal areas.    COLONOSCOPY:  Colonoscopy to screen for colon cancer is recommended for all women at age 43.  We know, you hate the idea of the prep.  We agree, BUT, having colon cancer and not knowing it is worse!!  Colon cancer so often starts as a polyp that can be seen and removed at colonscopy, which can quite literally save your life!  And if your first colonoscopy is normal and you have no family history of colon cancer, most women don't have to have it again for 10 years.  Once every ten years, you can do something that may end up saving  your life, right?  We will be happy to help you get it scheduled when you are ready.  Be sure to check your insurance coverage so you understand how much it will cost.  It may be covered as a preventative service at no cost, but you should check your particular policy.

## 2017-06-29 ENCOUNTER — Telehealth: Payer: Self-pay | Admitting: *Deleted

## 2017-06-29 DIAGNOSIS — E038 Other specified hypothyroidism: Secondary | ICD-10-CM | POA: Diagnosis not present

## 2017-06-29 LAB — VAGINITIS/VAGINOSIS, DNA PROBE
Candida Species: NEGATIVE
Gardnerella vaginalis: POSITIVE — AB
Trichomonas vaginosis: NEGATIVE

## 2017-06-29 MED ORDER — CLINDAMYCIN PHOSPHATE 2 % VA CREA: 1.0000 | TOPICAL_CREAM | Freq: Every day | VAGINAL | 0 refills | Status: AC

## 2017-06-29 NOTE — Telephone Encounter (Signed)
Notes recorded by Burnice Logan, RN on 06/29/2017 at 11:34 AM EDT Left message to call Sharee Pimple at [phone].

## 2017-06-29 NOTE — Telephone Encounter (Signed)
Spoke with patient, advised as seen below per Dr. Talbert Nan. Patient reports pancreatitis from use of metronidazole and declines metrogel. Rx for cleocin to verified pharmacy on file. Patient verbalizes understanding and is agreeable, will close encounter.

## 2017-06-29 NOTE — Telephone Encounter (Signed)
-----   Message from Salvadore Dom, MD sent at 06/29/2017 11:08 AM EDT ----- Please inform the patient that her vaginitis probe was + for BV and treat Cleocin cream, 1 applicator vaginally qhs x 7 days. The chart reports an allergy to flagyl, can you please find out what her reaction was. If it was just stomach upset then instead of cleocin I would treat with metrogel, 1 applicator vaginally qhs x 5 days.

## 2017-07-04 DIAGNOSIS — J181 Lobar pneumonia, unspecified organism: Secondary | ICD-10-CM | POA: Diagnosis not present

## 2017-07-05 DIAGNOSIS — E1169 Type 2 diabetes mellitus with other specified complication: Secondary | ICD-10-CM | POA: Diagnosis not present

## 2017-07-08 ENCOUNTER — Encounter (INDEPENDENT_AMBULATORY_CARE_PROVIDER_SITE_OTHER): Payer: Self-pay | Admitting: Radiology

## 2017-07-08 NOTE — Progress Notes (Unsigned)
Can you please call patient and sched Euflexxa injections bilateral knees with Dr Durward Fortes?   [employer] will cover 80%, so patient [name] be responsible for approx $350 per knee OOP for all injections. We will buy and bill, no PA needed.

## 2017-07-14 ENCOUNTER — Encounter: Payer: Self-pay | Admitting: Obstetrics and Gynecology

## 2017-07-14 ENCOUNTER — Ambulatory Visit (INDEPENDENT_AMBULATORY_CARE_PROVIDER_SITE_OTHER): Payer: 59 | Admitting: Obstetrics and Gynecology

## 2017-07-14 ENCOUNTER — Other Ambulatory Visit: Payer: Self-pay

## 2017-07-14 VITALS — BP 102/60 | HR 56 | Resp 18 | Wt 270.0 lb

## 2017-07-14 DIAGNOSIS — N763 Subacute and chronic vulvitis: Secondary | ICD-10-CM | POA: Diagnosis not present

## 2017-07-14 DIAGNOSIS — R232 Flushing: Secondary | ICD-10-CM

## 2017-07-14 NOTE — Progress Notes (Signed)
GYNECOLOGY  VISIT   HPI: 57 y.o.   Divorced  [place] American  female   [phone] with No LMP recorded. Patient has had a hysterectomy.   here for follow up on hot flashes and vulvitis. The patient was started on gabapentin earlier this month. It is helping, she is just taking one tablet at bedtime. She is having less hot flashes and they are now tolerable. No night sweats (wasn't an issue previously) She was treated with steroid ointment for the vulvitis and with cleocin for BV (allergy to flagyl). There was a focal area of concern on the right inner labia majora.  At the moment she is fine, no itching or irritation.  GYNECOLOGIC HISTORY: No LMP recorded. Patient has had a hysterectomy. Contraception:hysterectomy  Menopausal hormone therapy: none         OB History    Gravida  2   Para  2   Term  2   Preterm      AB      Living  2     SAB      TAB      Ectopic      Multiple      Live Births  2              Patient Active Problem List   Diagnosis Date Noted  . Bilateral primary osteoarthritis of knee 12/27/2016  . 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 Prediabetes 12/03/2015  . OSA on CPAP 02/19/2015  . Morbid obesity ([place]) 10/09/2014  . Pulmonary HTN ([place]) 05/12/2013  . Bilateral lower extremity edema 05/12/2013  . Morbid obesity with BMI of 50.0-59.9, adult ([place])   . Back pain, hx of prior back surgery 12/09/2011  . Pancreatitis, mild, CT negative 12/08/2011  . Fatty liver 12/08/2011  . Substernal chest pain, suspected secondary to acute pancreatitis 12/06/2011  . Hypertension 12/06/2011  . Hypokalemia 12/06/2011  . Hypothyroidism 12/06/2011  . GERD (gastroesophageal reflux disease) 12/06/2011  . Anxiety 12/06/2011  . BV (bacterial vaginosis)   . Hyperlipidemia     Past Medical History:  Diagnosis Date  . Anemia when she was young  . Arthritis   . Congestion of nasal sinus   . Constipation    takes Colace every other day  .  DDD (degenerative disc disease), lumbar   . Diabetes mellitus without complication ([place])    per pt had been diagnosed but then "number started going down so my doctor said i didnt have diabetes"  . Dry eyes    uses Systane Eye drops daily as needed  . GERD (gastroesophageal reflux disease) 12/06/2011   takes Nexium daily  . Headache(784.0)    occasionally-d/t congestion  . Heart murmur   . History of bronchitis 6-7 yrs ago  . Hyperlipidemia   . Hypertension    takes Metoprolol and Diovan daily  . Hypothyroidism 12/06/2011   takes Synthroid daily  . Joint pain   . Multiple allergies    takes Zyrtec daily;uses Nasonex daily as needed  . Nocturia   . Numbness    weakness-right hand  . OSA on CPAP   . Pancreatitis   . Peripheral edema    takes Furosemide daily  . Shingles   . Urinary frequency   . Venous insufficiency of leg     Past Surgical History:  Procedure Laterality Date  . ABDOMINAL HYSTERECTOMY     uterine prolapse  . BACK SURGERY     fusion  .  CARDIAC SURGERY    . CHOLECYSTECTOMY    . COLONOSCOPY    . ESOPHAGOGASTRODUODENOSCOPY  5-26yrs ago  . FOOT SURGERY Bilateral   . KNEE ARTHROSCOPY    . LAPAROSCOPIC GASTRIC SLEEVE RESECTION N/A 08/23/2016   Procedure: LAPAROSCOPIC GASTRIC SLEEVE RESECTION WITH UPPER ENDO;  Surgeon: Johnathan Hausen, MD;  [facility];  Service: General;  Laterality: N/A;  . SHOULDER ARTHROSCOPY WITH ROTATOR CUFF REPAIR AND SUBACROMIAL DECOMPRESSION Right 08/23/2013   Procedure: RIGHT SHOULDER ARTHROSCOPY WITH  SUBACROMIAL DECOMPRESSION DISTAL CLAVICLE RESECTION AND  ROTATOR CUFF REPAIR ;  Surgeon: Marin Shutter, MD;  Location: [facility];  Service: Orthopedics;  Laterality: Right;  . TUBAL LIGATION      Current Outpatient Medications  Medication Sig Dispense Refill  . albuterol (PROVENTIL HFA;VENTOLIN HFA) 108 (90 BASE) MCG/ACT inhaler Inhale 1-2 puffs into the lungs every 6 (six) hours as needed for wheezing or shortness of breath. 1  Inhaler 2  . aspirin EC 81 MG tablet Take 81 mg by mouth daily.     . betamethasone valerate ointment (VALISONE) 0.1 % Apply a pea sized amount topically BID for up to 2 weeks. Not for daily long term use. 30 g 0  . Biotin 5000 MCG CAPS Take 5,000 mcg by mouth daily.    . diazepam (VALIUM) 5 MG tablet Take 0.5-1 tablets (2.5-5 mg total) by mouth every 6 (six) hours as needed for muscle spasms or sedation. 40 tablet 1  . docusate sodium (COLACE) 100 MG capsule Take 100 mg by mouth daily.     Marland Kitchen esomeprazole (NEXIUM) 40 MG capsule Take 40 mg by mouth daily.     . fluticasone (FLONASE) 50 MCG/ACT nasal spray Place 1 spray into both nostrils daily as needed for allergies.   1  . furosemide (LASIX) 20 MG tablet Take 3 tablets (60 mg total) by mouth daily. NEED OV. (Patient taking differently: Take 60 mg by mouth as needed. NEED OV.) 270 tablet 0  . gabapentin (NEURONTIN) 100 MG capsule Take 1 capsule (100 mg total) by mouth 3 (three) times daily. 90 capsule 1  . levothyroxine (SYNTHROID, LEVOTHROID) 75 MCG tablet Take 75 mcg by mouth daily before breakfast.    . lisinopril (PRINIVIL,ZESTRIL) 20 MG tablet Take 1 tablet (20 mg total) by mouth daily. 90 tablet 3  . loratadine (CLARITIN) 10 MG tablet Take 10 mg by mouth daily.    . magnesium oxide (MAG-OX) 400 MG tablet Take 400 mg by mouth daily.    . meloxicam (MOBIC) 15 MG tablet   3  . metoprolol tartrate (LOPRESSOR) 25 MG tablet Take 25 mg by mouth 2 (two) times daily.    . Omega-3 Fatty Acids (FISH OIL) 1000 MG CAPS Take 1,000 mg by mouth daily.    . ondansetron (ZOFRAN ODT) 4 MG disintegrating tablet Take 1 tablet (4 mg total) by mouth every 8 (eight) hours as needed for nausea or vomiting. 20 tablet 0  . Polyethyl Glycol-Propyl Glycol (SYSTANE OP) Place 1 drop into both eyes daily as needed (dry eyes).     . potassium chloride SA (KLOR-CON M20) 20 MEQ tablet Take 1 tablet (20 mEq total) by mouth daily. (Patient taking differently: Take 20 mEq by  mouth as needed. ) 90 tablet 3  . pregabalin (LYRICA) 150 MG capsule TAKE ONE CAPSULE BY MOUTH 2 TIMES A DAY 180 capsule 4  . vitamin C (ASCORBIC ACID) 500 MG tablet Take 500 mg by mouth daily.     No  current facility-administered medications for this visit.      ALLERGIES: Metronidazole; Adhesive [tape]; Latex; Penicillins; and Vesicare [solifenacin]  Family History  Problem Relation Age of Onset  . Hypertension Mother   . Hypertension Father   . Hypertension Sister   . Kidney disease Sister        [facility] TRANSPLANT  . Thyroid disease Sister   . Thyroid disease Sister   . Hypertension Sister   . Breast cancer Maternal Aunt        >50; passed away from it  . Breast cancer Maternal Aunt        >50    Social History   Socioeconomic History  . Marital status: Divorced    Spouse name: Not on file  . Number of children: 2  . Years of education: College  . Highest education level: Not on file  Occupational History    Employer: [employer]    Comment: [employer]  Social Needs  . Financial resource strain: Not on file  . Food insecurity:    Worry: Not on file    Inability: Not on file  . Transportation needs:    Medical: Not on file    Non-medical: Not on file  Tobacco Use  . Smoking status: Former Smoker    Packs/day: 0.75    Years: 40.00    Pack years: 30.00    Types: Cigarettes    Last attempt to quit: 1993    Years since quitting: 26.4  . Smokeless tobacco: Never Used  . Tobacco comment: quit smoking in 1993  Substance and Sexual Activity  . Alcohol use: Yes    Alcohol/week: 0.0 oz    Comment: rarely;; mixed drink   . Drug use: No  . Sexual activity: Not Currently    Partners: Male    Birth control/protection: Surgical  Lifestyle  . Physical activity:    Days per week: Not on file    Minutes per session: Not on file  . Stress: Not on file  Relationships  . Social connections:    Talks on phone: Not on file    Gets together: Not on file    Attends religious  service: Not on file    Active member of club or organization: Not on file    Attends meetings of clubs or organizations: Not on file    Relationship status: Not on file  . Intimate partner violence:    Fear of current or ex partner: Not on file    Emotionally abused: Not on file    Physically abused: Not on file    Forced sexual activity: Not on file  Other Topics Concern  . Not on file  Social History Narrative   Caffeine: Coffee    Review of Systems  Constitutional: Negative.   HENT: Negative.   Eyes: Negative.   Respiratory: Negative.   Cardiovascular: Negative.   Gastrointestinal: Negative.   Genitourinary:       Night urination   Musculoskeletal: Negative.   Skin:       Hair loss  Neurological: Negative.   Endo/Heme/Allergies: Negative.   Psychiatric/Behavioral: Negative.     PHYSICAL EXAMINATION:    BP 102/60 (BP Location: Right Wrist, Patient Position: Sitting, Cuff Size: Normal)   Pulse (!) 56   Resp 18   Wt 270 lb (122.5 kg)   BMI 48.21 kg/m     General appearance: alert, cooperative and appears stated age   Pelvic: External genitalia:  Loss of pigmentation of the  inner vulva and perianal region. On the right inner labia majora lateral to the loss of pigmentation is another area of decreased pigmentation, this area has improved with steroid ointment. Previously seen whitening has improved.                Urethra:  normal appearing urethra with no masses, tenderness or lesions              Bartholins and Skenes: normal                   Chaperone was present for exam.  ASSESSMENT Hot flashes, much improved with 100 gm of gabapentin at night Vulvitis, improved with treatment of BV and steroid ointment    PLAN If needed she can progressively increase her gabapentin to 1 tablet 3 x a day (can go up from that if needed). She was given 90 tablets with one refill earlier this month. She will call if she needs more Discussed vulvar skin care, use vaseline as  needed, can use steroids sparingly Return with recurrent vulvar c/o    An After Visit Summary was printed and given to the patient.

## 2017-07-18 DIAGNOSIS — G4733 Obstructive sleep apnea (adult) (pediatric): Secondary | ICD-10-CM | POA: Diagnosis not present

## 2017-07-18 DIAGNOSIS — R339 Retention of urine, unspecified: Secondary | ICD-10-CM | POA: Diagnosis not present

## 2017-07-25 ENCOUNTER — Ambulatory Visit (INDEPENDENT_AMBULATORY_CARE_PROVIDER_SITE_OTHER): Payer: 59 | Admitting: Orthopaedic Surgery

## 2017-07-25 ENCOUNTER — Encounter (INDEPENDENT_AMBULATORY_CARE_PROVIDER_SITE_OTHER): Payer: Self-pay | Admitting: Orthopaedic Surgery

## 2017-07-25 VITALS — BP 143/89 | HR 79 | Ht 63.6 in | Wt 270.0 lb

## 2017-07-25 DIAGNOSIS — M170 Bilateral primary osteoarthritis of knee: Secondary | ICD-10-CM | POA: Diagnosis not present

## 2017-07-25 DIAGNOSIS — M1712 Unilateral primary osteoarthritis, left knee: Secondary | ICD-10-CM

## 2017-07-25 DIAGNOSIS — M1711 Unilateral primary osteoarthritis, right knee: Secondary | ICD-10-CM

## 2017-07-25 MED ORDER — SODIUM HYALURONATE (VISCOSUP) 20 MG/2ML IX SOSY
20.0000 mg | PREFILLED_SYRINGE | INTRA_ARTICULAR | Status: AC | PRN
  Administered 2017-07-25: 20 mg via INTRA_ARTICULAR

## 2017-07-25 NOTE — Progress Notes (Signed)
Office Visit Note   Patient: [name]           Date of Birth: 06/24/1960           MRN: [id number] Visit Date: 07/25/2017              Requested by: Velna Hatchet, MD [address] [place],  [address] PCP: Velna Hatchet, MD   Assessment & Plan: Visit Diagnoses:  1. Bilateral primary osteoarthritis of knee     Plan: Start Euflexxa injections today.  Complete series over the next 2 weeks  Follow-Up Instructions: Return in about 1 week (around 08/01/2017).   Orders:  Orders Placed This Encounter  Procedures  . Large Joint Inj: L knee  . Large Joint Inj: R knee   No orders of the defined types were placed in this encounter.     Procedures: Large Joint Inj: L knee on 07/25/2017 1:21 PM Indications: pain and joint swelling Details: 25 G 1.5 in needle, anteromedial approach  Arthrogram: No  Medications: 20 mg Sodium Hyaluronate 20 MG/2ML Outcome: tolerated well, no immediate complications Procedure, treatment alternatives, risks and benefits explained, specific risks discussed. Consent was given by the patient. Immediately prior to procedure a time out was called to verify the correct patient, procedure, equipment, support staff and site/side marked as required. Patient was prepped and draped in the usual sterile fashion.   Large Joint Inj: R knee on 07/25/2017 1:21 PM Indications: pain and joint swelling Details: 25 G 1.5 in needle  Arthrogram: No  Medications: 20 mg Sodium Hyaluronate 20 MG/2ML Outcome: tolerated well, no immediate complications Procedure, treatment alternatives, risks and benefits explained, specific risks discussed. Consent was given by the patient. Immediately prior to procedure a time out was called to verify the correct patient, procedure, equipment, support staff and site/side marked as required. Patient was prepped and draped in the usual sterile fashion.       Clinical Data: No additional findings.   Subjective: Chief  Complaint  Patient presents with  . Follow-up    # 1 EUFLEXXA BI L AT KNEE INJ    HPI  Review of Systems  Constitutional: Negative for fatigue and fever.  HENT: Negative for ear pain.   Eyes: Negative for pain.  Respiratory: Negative for cough and shortness of breath.   Cardiovascular: Positive for leg swelling.  Gastrointestinal: Positive for constipation. Negative for diarrhea.  Genitourinary: Negative for difficulty urinating.  Musculoskeletal: Positive for back pain. Negative for neck pain.  Skin: Positive for rash.  Allergic/Immunologic: Negative for food allergies.  Hematological: Bruises/bleeds easily.  Psychiatric/Behavioral: Positive for sleep disturbance.     Objective: Vital Signs: BP (!) 143/89 (BP Location: Left Arm, Patient Position: Sitting, Cuff Size: Normal)   Pulse 79   Ht 5' 3.6" (1.615 m)   Wt 270 lb (122.5 kg)   BMI 46.93 kg/m   Physical Exam  Ortho Exam  Large knees.  No obvious effusion.  Full extension about 100 degrees of flexion. neither knee was hot or swollen  Specialty Comments:  No specialty comments available.  Imaging: No results found.   PMFS History: Patient Active Problem List   Diagnosis Date Noted  . Bilateral primary osteoarthritis of knee 12/27/2016  . 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 Prediabetes 12/03/2015  . OSA on CPAP 02/19/2015  . Morbid obesity ([place]) 10/09/2014  . Pulmonary HTN ([place]) 05/12/2013  . Bilateral lower extremity edema 05/12/2013  . Morbid obesity with BMI of 50.0-59.9,  adult [facility])   . Back pain, hx of prior back surgery 12/09/2011  . Pancreatitis, mild, CT negative 12/08/2011  . Fatty liver 12/08/2011  . Substernal chest pain, suspected secondary to acute pancreatitis 12/06/2011  . Hypertension 12/06/2011  . Hypokalemia 12/06/2011  . Hypothyroidism 12/06/2011  . GERD (gastroesophageal reflux disease) 12/06/2011  . Anxiety 12/06/2011  . BV (bacterial vaginosis)   .  Hyperlipidemia    Past Medical History:  Diagnosis Date  . Anemia when she was young  . Arthritis   . Congestion of nasal sinus   . Constipation    takes Colace every other day  . DDD (degenerative disc disease), lumbar   . Diabetes mellitus without complication ([place])    per pt had been diagnosed but then "number started going down so my doctor said i didnt have diabetes"  . Dry eyes    uses Systane Eye drops daily as needed  . GERD (gastroesophageal reflux disease) 12/06/2011   takes Nexium daily  . Headache(784.0)    occasionally-d/t congestion  . Heart murmur   . History of bronchitis 6-7 yrs ago  . Hyperlipidemia   . Hypertension    takes Metoprolol and Diovan daily  . Hypothyroidism 12/06/2011   takes Synthroid daily  . Joint pain   . Multiple allergies    takes Zyrtec daily;uses Nasonex daily as needed  . Nocturia   . Numbness    weakness-right hand  . OSA on CPAP   . Pancreatitis   . Peripheral edema    takes Furosemide daily  . Shingles   . Urinary frequency   . Venous insufficiency of leg     Family History  Problem Relation Age of Onset  . Hypertension Mother   . Hypertension Father   . Hypertension Sister   . Kidney disease Sister        [facility] TRANSPLANT  . Thyroid disease Sister   . Thyroid disease Sister   . Hypertension Sister   . Breast cancer Maternal Aunt        >50; passed away from it  . Breast cancer Maternal Aunt        >50    Past Surgical History:  Procedure Laterality Date  . ABDOMINAL HYSTERECTOMY     uterine prolapse  . BACK SURGERY     fusion  . CARDIAC SURGERY    . CHOLECYSTECTOMY    . COLONOSCOPY    . ESOPHAGOGASTRODUODENOSCOPY  5-70yrs ago  . FOOT SURGERY Bilateral   . KNEE ARTHROSCOPY    . LAPAROSCOPIC GASTRIC SLEEVE RESECTION N/A 08/23/2016   Procedure: LAPAROSCOPIC GASTRIC SLEEVE RESECTION WITH UPPER ENDO;  Surgeon: Johnathan Hausen, MD;  [facility];  Service: General;  Laterality: N/A;  . SHOULDER ARTHROSCOPY  WITH ROTATOR CUFF REPAIR AND SUBACROMIAL DECOMPRESSION Right 08/23/2013   Procedure: RIGHT SHOULDER ARTHROSCOPY WITH  SUBACROMIAL DECOMPRESSION DISTAL CLAVICLE RESECTION AND  ROTATOR CUFF REPAIR ;  Surgeon: Marin Shutter, MD;  Location: [facility];  Service: Orthopedics;  Laterality: Right;  . TUBAL LIGATION     Social History   Occupational History    Employer: [employer]    Comment: [employer]  Tobacco Use  . Smoking status: Former Smoker    Packs/day: 0.75    Years: 40.00    Pack years: 30.00    Types: Cigarettes    Last attempt to quit: 1993    Years since quitting: 26.5  . Smokeless tobacco: Never Used  . Tobacco comment: quit smoking in 1993  Substance and Sexual Activity  . Alcohol use: Yes    Alcohol/week: 0.0 oz    Comment: rarely;; mixed drink   . Drug use: No  . Sexual activity: Not Currently    Partners: Male    Birth control/protection: Surgical

## 2017-08-01 ENCOUNTER — Ambulatory Visit (INDEPENDENT_AMBULATORY_CARE_PROVIDER_SITE_OTHER): Payer: 59 | Admitting: Orthopaedic Surgery

## 2017-08-01 ENCOUNTER — Encounter (INDEPENDENT_AMBULATORY_CARE_PROVIDER_SITE_OTHER): Payer: Self-pay | Admitting: Orthopaedic Surgery

## 2017-08-01 VITALS — BP 149/91 | HR 75 | Ht 63.5 in | Wt 270.0 lb

## 2017-08-01 DIAGNOSIS — M170 Bilateral primary osteoarthritis of knee: Secondary | ICD-10-CM

## 2017-08-01 MED ORDER — SODIUM HYALURONATE (VISCOSUP) 20 MG/2ML IX SOSY
20.0000 mg | PREFILLED_SYRINGE | INTRA_ARTICULAR | Status: AC | PRN
  Administered 2017-08-01: 20 mg via INTRA_ARTICULAR

## 2017-08-01 NOTE — Progress Notes (Signed)
Office Visit Note   Patient: [name]           Date of Birth: July 08, 1960           MRN: [id number] Visit Date: 08/01/2017              Requested by: Velna Hatchet, MD [address] [place], [place] [address] PCP: Velna Hatchet, MD   Assessment & Plan: Visit Diagnoses:  1. Bilateral primary osteoarthritis of knee     Plan: Second Euflexxa injection both knees today.  No problems after the first injections last week  Follow-Up Instructions: Return in about 1 week (around 08/08/2017).   Orders:  No orders of the defined types were placed in this encounter.  No orders of the defined types were placed in this encounter.     Procedures: Large Joint Inj: bilateral knee on 08/01/2017 4:12 PM Indications: pain and joint swelling Details: 25 G 1.5 in needle, anteromedial approach  Arthrogram: No  Medications (Right): 20 mg Sodium Hyaluronate 20 MG/2ML Medications (Left): 20 mg Sodium Hyaluronate 20 MG/2ML Outcome: tolerated well, no immediate complications Procedure, treatment alternatives, risks and benefits explained, specific risks discussed. Consent was given by the patient. Immediately prior to procedure a time out was called to verify the correct patient, procedure, equipment, support staff and site/side marked as required. Patient was prepped and draped in the usual sterile fashion.       Clinical Data: No additional findings.   Subjective: No chief complaint on file. Initiated Euflexxa injections last week without problem.  We will proceed with second injection today  HPI  Review of Systems   Objective: Vital Signs: BP (!) 149/91 (BP Location: Right Arm, Patient Position: Sitting, Cuff Size: Normal)   Pulse 75   Ht 5' 3.5" (1.613 m)   Wt 270 lb (122.5 kg)   BMI 47.08 kg/m   Physical Exam  Ortho Exam awake alert and oriented x3.  Comfortable sitting.  Large knees but no obvious effusion.  Predominant medial joint pain.  Range of motion 0 to  about 100 degrees without instability.  No calf pain.  No distal edema  Specialty Comments:  No specialty comments available.  Imaging: No results found.   PMFS History: Patient Active Problem List   Diagnosis Date Noted  . Bilateral primary osteoarthritis of knee 12/27/2016  . 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 Prediabetes 12/03/2015  . OSA on CPAP 02/19/2015  . Morbid obesity ([place]) 10/09/2014  . Pulmonary HTN ([place]) 05/12/2013  . Bilateral lower extremity edema 05/12/2013  . Morbid obesity with BMI of 50.0-59.9, adult ([place])   . Back pain, hx of prior back surgery 12/09/2011  . Pancreatitis, mild, CT negative 12/08/2011  . Fatty liver 12/08/2011  . Substernal chest pain, suspected secondary to acute pancreatitis 12/06/2011  . Hypertension 12/06/2011  . Hypokalemia 12/06/2011  . Hypothyroidism 12/06/2011  . GERD (gastroesophageal reflux disease) 12/06/2011  . Anxiety 12/06/2011  . BV (bacterial vaginosis)   . Hyperlipidemia    Past Medical History:  Diagnosis Date  . Anemia when she was young  . Arthritis   . Congestion of nasal sinus   . Constipation    takes Colace every other day  . DDD (degenerative disc disease), lumbar   . Diabetes mellitus without complication ([place])    per pt had been diagnosed but then "number started going down so my doctor said i didnt have diabetes"  . Dry eyes    uses Systane  Eye drops daily as needed  . GERD (gastroesophageal reflux disease) 12/06/2011   takes Nexium daily  . Headache(784.0)    occasionally-d/t congestion  . Heart murmur   . History of bronchitis 6-7 yrs ago  . Hyperlipidemia   . Hypertension    takes Metoprolol and Diovan daily  . Hypothyroidism 12/06/2011   takes Synthroid daily  . Joint pain   . Multiple allergies    takes Zyrtec daily;uses Nasonex daily as needed  . Nocturia   . Numbness    weakness-right hand  . OSA on CPAP   . Pancreatitis   . Peripheral edema    takes  Furosemide daily  . Shingles   . Urinary frequency   . Venous insufficiency of leg     Family History  Problem Relation Age of Onset  . Hypertension Mother   . Hypertension Father   . Hypertension Sister   . Kidney disease Sister        [facility] TRANSPLANT  . Thyroid disease Sister   . Thyroid disease Sister   . Hypertension Sister   . Breast cancer Maternal Aunt        >50; passed away from it  . Breast cancer Maternal Aunt        >50    Past Surgical History:  Procedure Laterality Date  . ABDOMINAL HYSTERECTOMY     uterine prolapse  . BACK SURGERY     fusion  . CARDIAC SURGERY    . CHOLECYSTECTOMY    . COLONOSCOPY    . ESOPHAGOGASTRODUODENOSCOPY  5-7yrs ago  . FOOT SURGERY Bilateral   . KNEE ARTHROSCOPY    . LAPAROSCOPIC GASTRIC SLEEVE RESECTION N/A 08/23/2016   Procedure: LAPAROSCOPIC GASTRIC SLEEVE RESECTION WITH UPPER ENDO;  Surgeon: Johnathan Hausen, MD;  [facility];  Service: General;  Laterality: N/A;  . SHOULDER ARTHROSCOPY WITH ROTATOR CUFF REPAIR AND SUBACROMIAL DECOMPRESSION Right 08/23/2013   Procedure: RIGHT SHOULDER ARTHROSCOPY WITH  SUBACROMIAL DECOMPRESSION DISTAL CLAVICLE RESECTION AND  ROTATOR CUFF REPAIR ;  Surgeon: Marin Shutter, MD;  Location: [facility];  Service: Orthopedics;  Laterality: Right;  . TUBAL LIGATION     Social History   Occupational History    Employer: [employer]    Comment: [employer]  Tobacco Use  . Smoking status: Former Smoker    Packs/day: 0.75    Years: 40.00    Pack years: 30.00    Types: Cigarettes    Last attempt to quit: 1993    Years since quitting: 26.5  . Smokeless tobacco: Never Used  . Tobacco comment: quit smoking in 1993  Substance and Sexual Activity  . Alcohol use: Yes    Alcohol/week: 0.0 oz    Comment: rarely;; mixed drink   . Drug use: No  . Sexual activity: Not Currently    Partners: Male    Birth control/protection: Surgical     Garald Balding, MD   Note - This record has been created using  [employer].  Chart creation errors have been sought, but may not always  have been located. Such creation errors do not reflect on  the standard of medical care.

## 2017-08-08 ENCOUNTER — Ambulatory Visit (INDEPENDENT_AMBULATORY_CARE_PROVIDER_SITE_OTHER): Payer: 59 | Admitting: Orthopaedic Surgery

## 2017-08-09 ENCOUNTER — Encounter (INDEPENDENT_AMBULATORY_CARE_PROVIDER_SITE_OTHER): Payer: Self-pay | Admitting: Orthopaedic Surgery

## 2017-08-09 ENCOUNTER — Ambulatory Visit (INDEPENDENT_AMBULATORY_CARE_PROVIDER_SITE_OTHER): Payer: 59 | Admitting: Orthopaedic Surgery

## 2017-08-09 VITALS — BP 116/72 | HR 59 | Ht 63.0 in | Wt 270.0 lb

## 2017-08-09 DIAGNOSIS — M170 Bilateral primary osteoarthritis of knee: Secondary | ICD-10-CM

## 2017-08-09 MED ORDER — SODIUM HYALURONATE (VISCOSUP) 20 MG/2ML IX SOSY
20.0000 mg | PREFILLED_SYRINGE | INTRA_ARTICULAR | Status: AC | PRN
  Administered 2017-08-09: 20 mg via INTRA_ARTICULAR

## 2017-08-09 NOTE — Progress Notes (Signed)
Office Visit Note   Patient: [name]           Date of Birth: 04/10/1960           MRN: [id number] Visit Date: 08/09/2017              Requested by: Velna Hatchet, MD [address] [place], [place] [address] PCP: Velna Hatchet, MD   Assessment & Plan: Visit Diagnoses:  1. Bilateral primary osteoarthritis of knee     Plan: Third Euflexxa injection today.  Follow-up as needed.  Follow-Up Instructions: Return if symptoms worsen or fail to improve.   Orders:  No orders of the defined types were placed in this encounter.  No orders of the defined types were placed in this encounter.     Procedures: Large Joint Inj: bilateral knee on 08/09/2017 3:44 PM Indications: pain and joint swelling Details: 25 G 1.5 in needle, anteromedial approach  Arthrogram: No  Medications (Right): 20 mg Sodium Hyaluronate 20 MG/2ML Medications (Left): 20 mg Sodium Hyaluronate 20 MG/2ML Outcome: tolerated well, no immediate complications Procedure, treatment alternatives, risks and benefits explained, specific risks discussed. Consent was given by the patient. Immediately prior to procedure a time out was called to verify the correct patient, procedure, equipment, support staff and site/side marked as required. Patient was prepped and draped in the usual sterile fashion.       Clinical Data: No additional findings.   Subjective: Chief Complaint  Patient presents with  . Follow-up    # 3 euflexxa bil lat knee injections  Some improvement of pain after the first 2 injections.  No complications  HPI  Review of Systems   Objective: Vital Signs: BP 116/72 (BP Location: Right Arm, Patient Position: Sitting, Cuff Size: Normal)   Pulse (!) 59   Ht 5\' 3"  (1.6 m)   Wt 270 lb (122.5 kg)   BMI 47.83 kg/m   Physical Exam  Ortho Exam both knees were not effused.  No increased heat or warmth  Specialty Comments:  No specialty comments available.  Imaging: No results  found.   PMFS History: Patient Active Problem List   Diagnosis Date Noted  . Bilateral primary osteoarthritis of knee 12/27/2016  . 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 Prediabetes 12/03/2015  . OSA on CPAP 02/19/2015  . Morbid obesity ([place]) 10/09/2014  . Pulmonary HTN ([place]) 05/12/2013  . Bilateral lower extremity edema 05/12/2013  . Morbid obesity with BMI of 50.0-59.9, adult ([place])   . Back pain, hx of prior back surgery 12/09/2011  . Pancreatitis, mild, CT negative 12/08/2011  . Fatty liver 12/08/2011  . Substernal chest pain, suspected secondary to acute pancreatitis 12/06/2011  . Hypertension 12/06/2011  . Hypokalemia 12/06/2011  . Hypothyroidism 12/06/2011  . GERD (gastroesophageal reflux disease) 12/06/2011  . Anxiety 12/06/2011  . BV (bacterial vaginosis)   . Hyperlipidemia    Past Medical History:  Diagnosis Date  . Anemia when she was young  . Arthritis   . Congestion of nasal sinus   . Constipation    takes Colace every other day  . DDD (degenerative disc disease), lumbar   . Diabetes mellitus without complication ([place])    per pt had been diagnosed but then "number started going down so my doctor said i didnt have diabetes"  . Dry eyes    uses Systane Eye drops daily as needed  . GERD (gastroesophageal reflux disease) 12/06/2011   takes Nexium daily  . Headache(784.0)  occasionally-d/t congestion  . Heart murmur   . History of bronchitis 6-7 yrs ago  . Hyperlipidemia   . Hypertension    takes Metoprolol and Diovan daily  . Hypothyroidism 12/06/2011   takes Synthroid daily  . Joint pain   . Multiple allergies    takes Zyrtec daily;uses Nasonex daily as needed  . Nocturia   . Numbness    weakness-right hand  . OSA on CPAP   . Pancreatitis   . Peripheral edema    takes Furosemide daily  . Shingles   . Urinary frequency   . Venous insufficiency of leg     Family History  Problem Relation Age of Onset  . Hypertension  Mother   . Hypertension Father   . Hypertension Sister   . Kidney disease Sister        [facility] TRANSPLANT  . Thyroid disease Sister   . Thyroid disease Sister   . Hypertension Sister   . Breast cancer Maternal Aunt        >50; passed away from it  . Breast cancer Maternal Aunt        >50    Past Surgical History:  Procedure Laterality Date  . ABDOMINAL HYSTERECTOMY     uterine prolapse  . BACK SURGERY     fusion  . CARDIAC SURGERY    . CHOLECYSTECTOMY    . COLONOSCOPY    . ESOPHAGOGASTRODUODENOSCOPY  5-17yrs ago  . FOOT SURGERY Bilateral   . KNEE ARTHROSCOPY    . LAPAROSCOPIC GASTRIC SLEEVE RESECTION N/A 08/23/2016   Procedure: LAPAROSCOPIC GASTRIC SLEEVE RESECTION WITH UPPER ENDO;  Surgeon: Johnathan Hausen, MD;  Location: [facility];  Service: General;  Laterality: N/A;  . SHOULDER ARTHROSCOPY WITH ROTATOR CUFF REPAIR AND SUBACROMIAL DECOMPRESSION Right 08/23/2013   Procedure: RIGHT SHOULDER ARTHROSCOPY WITH  SUBACROMIAL DECOMPRESSION DISTAL CLAVICLE RESECTION AND  ROTATOR CUFF REPAIR ;  Surgeon: Marin Shutter, MD;  Location: [facility];  Service: Orthopedics;  Laterality: Right;  . TUBAL LIGATION     Social History   Occupational History    Employer: [employer]    Comment: [employer]  Tobacco Use  . Smoking status: Former Smoker    Packs/day: 0.75    Years: 40.00    Pack years: 30.00    Types: Cigarettes    Last attempt to quit: 1993    Years since quitting: 26.5  . Smokeless tobacco: Never Used  . Tobacco comment: quit smoking in 1993  Substance and Sexual Activity  . Alcohol use: Yes    Alcohol/week: 0.0 oz    Comment: rarely;; mixed drink   . Drug use: No  . Sexual activity: Not Currently    Partners: Male    Birth control/protection: Surgical     Garald Balding, MD   Note - This record has been created using [employer].  Chart creation errors have been sought, but may not always  have been located. Such creation errors do not reflect on  the standard of  medical care.

## 2017-08-19 DIAGNOSIS — Z9884 Bariatric surgery status: Secondary | ICD-10-CM | POA: Diagnosis not present

## 2017-08-25 ENCOUNTER — Telehealth (INDEPENDENT_AMBULATORY_CARE_PROVIDER_SITE_OTHER): Payer: Self-pay | Admitting: Physical Medicine and Rehabilitation

## 2017-08-26 NOTE — Telephone Encounter (Signed)
Yes ok 

## 2017-08-26 NOTE — Telephone Encounter (Signed)
Left message for patient to call back to schedule.  °

## 2017-08-29 ENCOUNTER — Telehealth (INDEPENDENT_AMBULATORY_CARE_PROVIDER_SITE_OTHER): Payer: Self-pay | Admitting: *Deleted

## 2017-08-29 NOTE — Telephone Encounter (Signed)
Patient called back- can you call to try to schedule her? [phone]

## 2017-08-29 NOTE — Telephone Encounter (Signed)
Pt scheduled 09/12/17.

## 2017-09-07 DIAGNOSIS — Z6841 Body Mass Index (BMI) 40.0 and over, adult: Secondary | ICD-10-CM | POA: Diagnosis not present

## 2017-09-07 DIAGNOSIS — M5440 Lumbago with sciatica, unspecified side: Secondary | ICD-10-CM | POA: Diagnosis not present

## 2017-09-07 DIAGNOSIS — M461 Sacroiliitis, not elsewhere classified: Secondary | ICD-10-CM | POA: Diagnosis not present

## 2017-09-08 ENCOUNTER — Ambulatory Visit (INDEPENDENT_AMBULATORY_CARE_PROVIDER_SITE_OTHER): Payer: 59

## 2017-09-08 ENCOUNTER — Encounter: Payer: Self-pay | Admitting: Podiatry

## 2017-09-08 ENCOUNTER — Other Ambulatory Visit: Payer: Self-pay | Admitting: Cardiovascular Disease

## 2017-09-08 ENCOUNTER — Ambulatory Visit (INDEPENDENT_AMBULATORY_CARE_PROVIDER_SITE_OTHER): Payer: 59 | Admitting: Podiatry

## 2017-09-08 DIAGNOSIS — M67472 Ganglion, left ankle and foot: Secondary | ICD-10-CM

## 2017-09-08 NOTE — Patient Instructions (Signed)
Pre-Operative Instructions  Congratulations, you have decided to take an important step towards improving your quality of life.  You can be assured that the doctors and staff at [facility] will be with you every step of the way.  Here are some important things you should know:  1. Plan to be at the surgery center/hospital at least 1 (one) hour prior to your scheduled time, unless otherwise directed by the surgical center/hospital staff.  You must have a responsible adult accompany you, remain during the surgery and drive you home.  Make sure you have directions to the surgical center/hospital to ensure you arrive on time. 2. If you are having surgery at [facility] or [facility] hospitals, you will need a copy of your medical history and physical form from your family physician within one month prior to the date of surgery. We will give you a form for your primary physician to complete.  3. We make every effort to accommodate the date you request for surgery.  However, there are times where surgery dates or times have to be moved.  We will contact you as soon as possible if a change in schedule is required.   4. No aspirin/ibuprofen for one week before surgery.  If you are on aspirin, any non-steroidal anti-inflammatory medications (Mobic, Aleve, Ibuprofen) should not be taken seven (7) days prior to your surgery.  You make take Tylenol for pain prior to surgery.  5. Medications - If you are taking daily heart and blood pressure medications, seizure, reflux, allergy, asthma, anxiety, pain or diabetes medications, make sure you notify the surgery center/hospital before the day of surgery so they can tell you which medications you should take or avoid the day of surgery. 6. No food or drink after midnight the night before surgery unless directed otherwise by surgical center/hospital staff. 7. No alcoholic beverages 24-hours prior to surgery.  No smoking 24-hours prior or 24-hours after  surgery. 8. Wear loose pants or shorts. They should be loose enough to fit over bandages, boots, and casts. 9. Don't wear slip-on shoes. Sneakers are preferred. 10. Bring your boot with you to the surgery center/hospital.  Also bring crutches or a walker if your physician has prescribed it for you.  If you do not have this equipment, it will be provided for you after surgery. 11. If you have not been contacted by the surgery center/hospital by the day before your surgery, call to confirm the date and time of your surgery. 12. Leave-time from work may vary depending on the type of surgery you have.  Appropriate arrangements should be made prior to surgery with your employer. 13. Prescriptions will be provided immediately following surgery by your doctor.  Fill these as soon as possible after surgery and take the medication as directed. Pain medications will not be refilled on weekends and must be approved by the doctor. 14. Remove nail polish on the operative foot and avoid getting pedicures prior to surgery. 15. Wash the night before surgery.  The night before surgery wash the foot and leg well with water and the antibacterial soap provided. Be sure to pay special attention to beneath the toenails and in between the toes.  Wash for at least three (3) minutes. Rinse thoroughly with water and dry well with a towel.  Perform this wash unless told not to do so by your physician.  Enclosed: 1 Ice pack (please put in freezer the night before surgery)   1 Hibiclens skin cleaner     Pre-op instructions  If you have any questions regarding the instructions, please do not hesitate to call our office.  [place]: [address], [place], [place] [address] -- [phone]  [place]: [address] [place], [place] [address] -- [phone]  [place]: [address]  [place], [place] [address] -- [phone]  [place]: [address], [place], [place] [address] -- [phone]  Website:  [email or web address] 

## 2017-09-08 NOTE — Progress Notes (Signed)
She presents today after having not seen her since June of last year.  She is complaining of painful knot to the dorsal aspect of the left foot which is been coming up for several years and is started to result in numbness and tenderness to the second and first toe of the left foot.  She is also complaining of pain on palpation to the first metatarsal phalangeal joint of the left foot.  To some degree the second toe of the left foot.  I have reviewed her past medical history medications allergy surgeries social history and review of systems.  Objective: Vital signs are stable alert and oriented x3.  Pulses are palpable.  Neurologic sensorium is intact.  Degenerative flexors are intact.  Muscle strength is normal symmetrical bilateral.  Orthopedic evaluation demonstrates dorsal exostosis with overlying soft tissue mass more than likely dorsal excess doses with a small ganglion.  This is confirmed on radiograph.  She has mild hallux valgus deformity of the left foot with hammertoe deformity second digit left foot.  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  Plan: Discussed etiology pathology conservative versus surgical therapies at this point I feel that a dorsal tarsal exostectomy an Austin bunion repair and a possible second metatarsal osteotomy is necessary for correction of this deformity.  She understands and is amenable to it.  We went over the consent form today line by line number by number given her abdominal score she self it regarding these procedures I answered them to the best my ability layman's terms she understands is amenable to it and signed all 3 pages a consent form.  I also discussed the possible complications which may include but not limited to postop pain bleeding swelling infection recurrence need further surgery overcorrection under correction.  We dispensed preop instructions a Cam walker for postop recovery as well as information  regarding the surgery center and the anesthesia group.  Answer all questions best my ability in layman's terms she understood I will follow-up with her in the near future for surgery.

## 2017-09-12 ENCOUNTER — Encounter (INDEPENDENT_AMBULATORY_CARE_PROVIDER_SITE_OTHER): Payer: Self-pay | Admitting: Physical Medicine and Rehabilitation

## 2017-09-12 ENCOUNTER — Ambulatory Visit (INDEPENDENT_AMBULATORY_CARE_PROVIDER_SITE_OTHER): Payer: Self-pay

## 2017-09-12 ENCOUNTER — Ambulatory Visit (INDEPENDENT_AMBULATORY_CARE_PROVIDER_SITE_OTHER): Payer: 59 | Admitting: Physical Medicine and Rehabilitation

## 2017-09-12 DIAGNOSIS — M961 Postlaminectomy syndrome, not elsewhere classified: Secondary | ICD-10-CM | POA: Diagnosis not present

## 2017-09-12 DIAGNOSIS — M461 Sacroiliitis, not elsewhere classified: Secondary | ICD-10-CM | POA: Diagnosis not present

## 2017-09-12 NOTE — Patient Instructions (Signed)

## 2017-09-12 NOTE — Progress Notes (Signed)
NYSA SARIN - 57 y.o. female MRN [id number]  Date of birth: [birth year]  Office Visit Note: Visit Date: 09/12/2017 PCP: Velna Hatchet, MD Referred by: Velna Hatchet, MD  Subjective: Chief Complaint  Patient presents with  . Lower Back - Pain  . Right Leg - Pain  . Left Leg - Pain   HPI: [name] is a 57 year old female that we have known quite well over the last several years.  We still maintain her with medications for more paresthesia and radicular type complaints.  She has had prior lumbar fusion at L3-4 and L4-5.  MRI from 2016 with adjacent level disease of mainly facet arthropathy without stenosis.  Prior sacroiliac joint injections have really helped the most.  She does have sclerosis on bilateral SI joints noted on x-ray.  She is recently seen Dr. Amil Amen in rheumatology for evaluation and this is ongoing.  We are going to repeat the bilateral sacroiliac joint injections today.  She says the last injection did help quite a bit.  Plan next would be to likely repeat lumbar spine MRI at some point.  She will continue with current medications.   ROS Otherwise per HPI.  Assessment & Plan: Visit Diagnoses:  1. Sacroiliitis ([place])   2. Post laminectomy syndrome     Plan: No additional findings.   Meds & Orders: No orders of the defined types were placed in this encounter.   Orders Placed This Encounter  Procedures  . Sacroiliac Joint Inj  . XR C-ARM NO REPORT    Follow-up: Return if symptoms worsen or fail to improve.   Procedures: Bilateral Sacroiliac Joint Inj on 09/12/2017 3:04 PM Indications: pain and diagnostic evaluation Details: 22 G 3.5 in needle, fluoroscopy-guided posterior approach Outcome: tolerated well, no immediate complications  There was excellent flow of contrast producing a partial arthrogram of the sacroiliac joint.  Procedure, treatment alternatives, risks and benefits explained, specific risks discussed. Consent was given by the patient.  Immediately prior to procedure a time out was called to verify the correct patient, procedure, equipment, support staff and site/side marked as required. Patient was prepped and draped in the usual sterile fashion.      No notes on file   Clinical History: Lumbar spine MRI dated 06/11/2014 IMPRESSION: Interval fusion L3-4 L4-5. Posterior decompression at these levels without stenosis.  Progressive facet degeneration and mild spinal stenosis at L2-3.  Progressive facet degeneration on the left at L5-S1 with left foraminal encroachment.    She reports that she quit smoking about 26 years ago. Her smoking use included cigarettes. She has a 30.00 pack-year smoking history. She has never used smokeless tobacco. No results for input(s): HGBA1C, LABURIC in the last 8760 hours.  Objective:  VS:  HT:    WT:   BMI:     BP:   HR: bpm  TEMP: ( )  RESP:  Physical Exam  Ortho Exam Imaging: No results found.  Past Medical/Family/Surgical/Social History: Medications & Allergies reviewed per EMR, new medications updated. Patient Active Problem List   Diagnosis Date Noted  . Bilateral primary osteoarthritis of knee 12/27/2016  . 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 Prediabetes 12/03/2015  . OSA on CPAP 02/19/2015  . Morbid obesity ([place]) 10/09/2014  . Pulmonary HTN ([place]) 05/12/2013  . Bilateral lower extremity edema 05/12/2013  . Morbid obesity with BMI of 50.0-59.9, adult ([place])   . Back pain, hx of prior back surgery 12/09/2011  . Pancreatitis, mild, CT negative 12/08/2011  .  Fatty liver 12/08/2011  . Substernal chest pain, suspected secondary to acute pancreatitis 12/06/2011  . Hypertension 12/06/2011  . Hypokalemia 12/06/2011  . Hypothyroidism 12/06/2011  . GERD (gastroesophageal reflux disease) 12/06/2011  . Anxiety 12/06/2011  . BV (bacterial vaginosis)   . Hyperlipidemia    Past Medical History:  Diagnosis Date  . Anemia when she was young  .  Arthritis   . Congestion of nasal sinus   . Constipation    takes Colace every other day  . DDD (degenerative disc disease), lumbar   . Diabetes mellitus without complication ([place])    per pt had been diagnosed but then "number started going down so my doctor said i didnt have diabetes"  . Dry eyes    uses Systane Eye drops daily as needed  . GERD (gastroesophageal reflux disease) 12/06/2011   takes Nexium daily  . Headache(784.0)    occasionally-d/t congestion  . Heart murmur   . History of bronchitis 6-7 yrs ago  . Hyperlipidemia   . Hypertension    takes Metoprolol and Diovan daily  . Hypothyroidism 12/06/2011   takes Synthroid daily  . Joint pain   . Multiple allergies    takes Zyrtec daily;uses Nasonex daily as needed  . Nocturia   . Numbness    weakness-right hand  . OSA on CPAP   . Pancreatitis   . Peripheral edema    takes Furosemide daily  . Shingles   . Urinary frequency   . Venous insufficiency of leg    Family History  Problem Relation Age of Onset  . Hypertension Mother   . Hypertension Father   . Hypertension Sister   . Kidney disease Sister        [facility] TRANSPLANT  . Thyroid disease Sister   . Thyroid disease Sister   . Hypertension Sister   . Breast cancer Maternal Aunt        >50; passed away from it  . Breast cancer Maternal Aunt        >50   Past Surgical History:  Procedure Laterality Date  . ABDOMINAL HYSTERECTOMY     uterine prolapse  . BACK SURGERY     fusion  . CARDIAC SURGERY    . CHOLECYSTECTOMY    . COLONOSCOPY    . ESOPHAGOGASTRODUODENOSCOPY  5-9yrs ago  . FOOT SURGERY Bilateral   . KNEE ARTHROSCOPY    . LAPAROSCOPIC GASTRIC SLEEVE RESECTION N/A 08/23/2016   Procedure: LAPAROSCOPIC GASTRIC SLEEVE RESECTION WITH UPPER ENDO;  Surgeon: Johnathan Hausen, MD;  Location[facility];  Service: General;  Laterality: N/A;  . SHOULDER ARTHROSCOPY WITH ROTATOR CUFF REPAIR AND SUBACROMIAL DECOMPRESSION Right 08/23/2013   Procedure: RIGHT  SHOULDER ARTHROSCOPY WITH  SUBACROMIAL DECOMPRESSION DISTAL CLAVICLE RESECTION AND  ROTATOR CUFF REPAIR ;  Surgeon: Marin Shutter, MD;  Location: [facility];  Service: Orthopedics;  Laterality: Right;  . TUBAL LIGATION     Social History   Occupational History    Employer: [employer]    Comment: [employer]  Tobacco Use  . Smoking status: Former Smoker    Packs/day: 0.75    Years: 40.00    Pack years: 30.00    Types: Cigarettes    Last attempt to quit: 1993    Years since quitting: 26.6  . Smokeless tobacco: Never Used  . Tobacco comment: quit smoking in 1993  Substance and Sexual Activity  . Alcohol use: Yes    Alcohol/week: 0.0 standard drinks    Comment: rarely;; mixed drink   .  Drug use: No  . Sexual activity: Not Currently    Partners: Male    Birth control/protection: Surgical

## 2017-09-12 NOTE — Progress Notes (Signed)
 .  Numeric Pain Rating Scale and Functional Assessment Average Pain 8   In the last MONTH (on 0-10 scale) has pain interfered with the following?  1. General activity like being  able to carry out your everyday physical activities such as walking, climbing stairs, carrying groceries, or moving a chair?  Rating(5)   , -BT, -Dye Allergies.

## 2017-09-14 ENCOUNTER — Telehealth: Payer: Self-pay | Admitting: *Deleted

## 2017-09-14 NOTE — Telephone Encounter (Signed)
"  I just want to see what surgical center I'm going to have my surgery on October 11.  If you could, give me a call back."

## 2017-09-16 ENCOUNTER — Telehealth: Payer: Self-pay | Admitting: Podiatry

## 2017-09-16 NOTE — Telephone Encounter (Signed)
I'm supposed to be having surgery performed on 11 October. I've been trying to call the surgical coordinator but I cannot get her to give me a call back and I've done called several times. I need to put my FMLA in and I need to have some kind of information, I mean I need to ask some questions to see what I need to do. If you could give me a call back at [phone]. Thank you.

## 2017-09-16 NOTE — Telephone Encounter (Signed)
"  Dr. Milinda Pointer is supposed to schedule my surgery for October 11.  I am trying to get all my stuff in order before I call [facility].  I called the surgical center and they said you all had not scheduled the surgery yet.  I need to know something before I call [facility].  Please give me a call."  I am returning your call.  I do have your paperwork to schedule you for October 11.  I have not sent it to the surgical center but we do have you scheduled.  You can go ahead and make whatever arrangements you need to make.  "I just want to make sure I have all my ducks in a row.  Who do I send the FMLA papers to?"  You need to send those to the attention of Helayne [place].

## 2017-09-16 NOTE — Telephone Encounter (Signed)
I called the pt back to let her know that Renaldo Reel is who does all of our FMLA/Short Term Disability paperwork. I told the pt I wasn't sure how far in advance she fills out the paperwork. Pt stated [employer] faxed something the other day and said they needed the information back within 17 days. I gave the pt Janet's direct phone number of [phone] and the fax number of [phone]. I told the pt that Marcie Bal is only in the office on Wednesday's and Thursday's.

## 2017-09-17 ENCOUNTER — Encounter (HOSPITAL_COMMUNITY): Payer: Self-pay | Admitting: Emergency Medicine

## 2017-09-17 ENCOUNTER — Ambulatory Visit (HOSPITAL_COMMUNITY)
Admission: EM | Admit: 2017-09-17 | Discharge: 2017-09-17 | Disposition: A | Discharge status: Home / Self Care | Payer: 59 | Attending: Internal Medicine | Admitting: Internal Medicine

## 2017-09-17 ENCOUNTER — Other Ambulatory Visit: Payer: Self-pay

## 2017-09-17 DIAGNOSIS — R2243 Localized swelling, mass and lump, lower limb, bilateral: Secondary | ICD-10-CM | POA: Diagnosis not present

## 2017-09-17 DIAGNOSIS — M79604 Pain in right leg: Secondary | ICD-10-CM | POA: Diagnosis not present

## 2017-09-17 DIAGNOSIS — M79605 Pain in left leg: Secondary | ICD-10-CM

## 2017-09-17 LAB — POCT URINALYSIS DIP (DEVICE)
BILIRUBIN URINE: NEGATIVE
Glucose, UA: NEGATIVE mg/dL
Hgb urine dipstick: NEGATIVE
Ketones, ur: NEGATIVE mg/dL
LEUKOCYTES UA: NEGATIVE
NITRITE: NEGATIVE
Protein, ur: NEGATIVE mg/dL
SPECIFIC GRAVITY, URINE: 1.02 (ref 1.005–1.030)
Urobilinogen, UA: 0.2 mg/dL (ref 0.0–1.0)
pH: 5.5 (ref 5.0–8.0)

## 2017-09-17 LAB — POCT I-STAT, CHEM 8
BUN: 17 mg/dL (ref 6–20)
CHLORIDE: 102 mmol/L (ref 98–111)
CREATININE: 0.8 mg/dL (ref 0.44–1.00)
Calcium, Ion: 1.23 mmol/L (ref 1.15–1.40)
Glucose, Bld: 79 mg/dL (ref 70–99)
HEMATOCRIT: 44 % (ref 36.0–46.0)
Hemoglobin: 15 g/dL (ref 12.0–15.0)
POTASSIUM: 4.4 mmol/L (ref 3.5–5.1)
Sodium: 141 mmol/L (ref 135–145)
TCO2: 27 mmol/L (ref 22–32)

## 2017-09-17 NOTE — ED Triage Notes (Signed)
All night patient was having bilateral leg cramping, persistent.  Patient also noticed she is urinating more.

## 2017-09-17 NOTE — Discharge Instructions (Addendum)
Follow up with PMD for further evaluation.

## 2017-09-17 NOTE — ED Provider Notes (Addendum)
[name]    CSN: [id number] Arrival date & time: 09/17/17  1134     History   Chief Complaint Chief Complaint  Patient presents with  . Leg Pain    HPI [name] is a 57 y.o. female.   57 y.o. female presents with bilateral leg pain  That is persistent and worsening X 1 week. Patient states that she has had an increase in lower extremity edema and has been increasing her lasix dosage and as a result has had an increase in leg cramps. Patient states that she increased her potassium prescription however the leg cramps have not improved.  Patient states that she was instructed to come here for fluids.  Condition is acute in nature. Condition is made better by nothing. Condition is made worse by nothing. Patient denies any relief from lasix and potassium taken prior to there arrival at this facility.       Past Medical History:  Diagnosis Date  . Anemia when she was young  . Arthritis   . Congestion of nasal sinus   . Constipation    takes Colace every other day  . DDD (degenerative disc disease), lumbar   . Diabetes mellitus without complication ([place])    per pt had been diagnosed but then "number started going down so my doctor said i didnt have diabetes"  . Dry eyes    uses Systane Eye drops daily as needed  . GERD (gastroesophageal reflux disease) 12/06/2011   takes Nexium daily  . Headache(784.0)    occasionally-d/t congestion  . Heart murmur   . History of bronchitis 6-7 yrs ago  . Hyperlipidemia   . Hypertension    takes Metoprolol and Diovan daily  . Hypothyroidism 12/06/2011   takes Synthroid daily  . Joint pain   . Multiple allergies    takes Zyrtec daily;uses Nasonex daily as needed  . Nocturia   . Numbness    weakness-right hand  . OSA on CPAP   . Pancreatitis   . Peripheral edema    takes Furosemide daily  . Shingles   . Urinary frequency   . Venous insufficiency of leg     Patient Active Problem List   Diagnosis Date Noted   . Bilateral primary osteoarthritis of knee 12/27/2016  . 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 Prediabetes 12/03/2015  . OSA on CPAP 02/19/2015  . Morbid obesity ([place]) 10/09/2014  . Pulmonary HTN ([place]) 05/12/2013  . Bilateral lower extremity edema 05/12/2013  . Morbid obesity with BMI of 50.0-59.9, adult ([place])   . Back pain, hx of prior back surgery 12/09/2011  . Pancreatitis, mild, CT negative 12/08/2011  . Fatty liver 12/08/2011  . Substernal chest pain, suspected secondary to acute pancreatitis 12/06/2011  . Hypertension 12/06/2011  . Hypokalemia 12/06/2011  . Hypothyroidism 12/06/2011  . GERD (gastroesophageal reflux disease) 12/06/2011  . Anxiety 12/06/2011  . BV (bacterial vaginosis)   . Hyperlipidemia     Past Surgical History:  Procedure Laterality Date  . ABDOMINAL HYSTERECTOMY     uterine prolapse  . BACK SURGERY     fusion  . CARDIAC SURGERY    . CHOLECYSTECTOMY    . COLONOSCOPY    . ESOPHAGOGASTRODUODENOSCOPY  5-74yrs ago  . FOOT SURGERY Bilateral   . KNEE ARTHROSCOPY    . LAPAROSCOPIC GASTRIC SLEEVE RESECTION N/A 08/23/2016   Procedure: LAPAROSCOPIC GASTRIC SLEEVE RESECTION WITH UPPER ENDO;  Surgeon: Johnathan Hausen, MD;  Location: [facility];  Service:  General;  Laterality: N/A;  . SHOULDER ARTHROSCOPY WITH ROTATOR CUFF REPAIR AND SUBACROMIAL DECOMPRESSION Right 08/23/2013   Procedure: RIGHT SHOULDER ARTHROSCOPY WITH  SUBACROMIAL DECOMPRESSION DISTAL CLAVICLE RESECTION AND  ROTATOR CUFF REPAIR ;  Surgeon: Marin Shutter, MD;  Location: [facility];  Service: Orthopedics;  Laterality: Right;  . TUBAL LIGATION      OB History    Gravida  2   Para  2   Term  2   Preterm      AB      Living  2     SAB      TAB      Ectopic      Multiple      Live Births  2            Home Medications    Prior to Admission medications   Medication Sig Start Date End Date Taking? Authorizing Provider  albuterol (PROVENTIL HFA;VENTOLIN HFA)  108 (90 BASE) MCG/ACT inhaler Inhale 1-2 puffs into the lungs every 6 (six) hours as needed for wheezing or shortness of breath. 10/09/14   Croitoru, Mihai, MD  aspirin EC 81 MG tablet Take 81 mg by mouth daily.     [provider]  betamethasone valerate ointment (VALISONE) 0.1 % Apply a pea sized amount topically BID for up to 2 weeks. Not for daily long term use. 06/28/17   Salvadore Dom, MD  Biotin 5000 MCG CAPS Take 5,000 mcg by mouth daily.    [provider]  diazepam (VALIUM) 5 MG tablet Take 0.5-1 tablets (2.5-5 mg total) by mouth every 6 (six) hours as needed for muscle spasms or sedation. 08/23/13   Shuford, Olivia Mackie, PA-C  docusate sodium (COLACE) 100 MG capsule Take 100 mg by mouth daily.     [provider]  esomeprazole (NEXIUM) 40 MG capsule Take 40 mg by mouth daily.     [provider]  fluticasone (FLONASE) 50 MCG/ACT nasal spray Place 1 spray into both nostrils daily as needed for allergies.  01/06/16   [provider]  furosemide (LASIX) 20 MG tablet Take 3 tablets (60 mg total) by mouth daily. NEED OV. Patient taking differently: Take 60 mg by mouth as needed. NEED OV. 02/11/17   Croitoru, Dani Gobble, MD  gabapentin (NEURONTIN) 100 MG capsule Take 1 capsule (100 mg total) by mouth 3 (three) times daily. 06/28/17   Salvadore Dom, MD  ibuprofen (ADVIL,MOTRIN) 600 MG tablet  08/09/17   [provider]  levothyroxine (SYNTHROID, LEVOTHROID) 75 MCG tablet Take 75 mcg by mouth daily before breakfast.    [provider]  lisinopril (PRINIVIL,ZESTRIL) 20 MG tablet Take 1 tablet (20 mg total) by mouth daily. 05/04/17 04/29/18  Croitoru, Mihai, MD  loratadine (CLARITIN) 10 MG tablet Take 10 mg by mouth daily.    [provider]  magnesium oxide (MAG-OX) 400 MG tablet Take 400 mg by mouth daily.    [provider]  meloxicam (MOBIC) 15 MG tablet  04/07/17   [provider]  metoprolol tartrate (LOPRESSOR)  25 MG tablet Take 25 mg by mouth 2 (two) times daily.    [provider]  Omega-3 Fatty Acids (FISH OIL) 1000 MG CAPS Take 1,000 mg by mouth daily.    [provider]  ondansetron (ZOFRAN ODT) 4 MG disintegrating tablet Take 1 tablet (4 mg total) by mouth every 8 (eight) hours as needed for nausea or vomiting. 08/25/16   Kalman Drape, PA  Polyethyl Glycol-Propyl Glycol (SYSTANE OP) Place 1 drop into both eyes daily as needed (dry eyes).     [provider]  potassium chloride SA (KLOR-CON M20) 20 MEQ tablet Take 1 tablet (20 mEq total) by mouth daily. Patient taking differently: Take 20 mEq by mouth as needed.  04/25/17 07/24/17  Croitoru, Mihai, MD  pregabalin (LYRICA) 150 MG capsule TAKE ONE CAPSULE BY MOUTH 2 TIMES A DAY 02/28/17   Magnus Sinning, MD  vitamin C (ASCORBIC ACID) 500 MG tablet Take 500 mg by mouth daily.    [provider]    Family History Family History  Problem Relation Age of Onset  . Hypertension Mother   . Hypertension Father   . Hypertension Sister   . Kidney disease Sister        [facility] TRANSPLANT  . Thyroid disease Sister   . Thyroid disease Sister   . Hypertension Sister   . Breast cancer Maternal Aunt        >50; passed away from it  . Breast cancer Maternal Aunt        >50    Social History Social History   Tobacco Use  . Smoking status: Former Smoker    Packs/day: 0.75    Years: 40.00    Pack years: 30.00    Types: Cigarettes    Last attempt to quit: 1993    Years since quitting: 26.6  . Smokeless tobacco: Never Used  . Tobacco comment: quit smoking in 1993  Substance Use Topics  . Alcohol use: Yes    Alcohol/week: 0.0 standard drinks    Comment: rarely;; mixed drink   . Drug use: No     Allergies   Metronidazole; Adhesive [tape]; Latex; Penicillins; and Vesicare [solifenacin]   Review of Systems Review of Systems  Constitutional: Negative for chills and fever.  HENT: Negative for ear pain and sore  throat.   Eyes: Negative for pain and visual disturbance.  Respiratory: Negative for cough and shortness of breath.   Cardiovascular: Negative for chest pain and palpitations.  Gastrointestinal: Negative for abdominal pain and vomiting.  Genitourinary: Negative for dysuria and hematuria.  Musculoskeletal: Negative for arthralgias and back pain.       Bilateral leg cramps.   Skin: Negative for color change and rash.  Neurological: Negative for seizures and syncope.  All other systems reviewed and are negative.    Physical Exam Triage Vital Signs ED Triage Vitals  Enc Vitals Group     BP 09/17/17 1239 129/65     Pulse Rate 09/17/17 1239 61     Resp 09/17/17 1239 20     Temp 09/17/17 1239 97.8 F (36.6 C)     Temp Source 09/17/17 1239 Oral     SpO2 09/17/17 1239 99 %     Weight --      Height --      Head Circumference --      Peak Flow --      Pain Score 09/17/17 1236 8     Pain Loc --      Pain Edu? --      Excl. in [place]? --    No data found.  Updated Vital Signs BP 129/65 (BP Location: Left Arm) Comment (BP Location): regular cuff on left forearm  Pulse 61   Temp 97.8 F (36.6 C) (Oral)   Resp 20   SpO2 99%   Visual Acuity Right Eye Distance:   Left Eye Distance:   Bilateral Distance:  Right Eye Near:   Left Eye Near:    Bilateral Near:     Physical Exam  Constitutional: She is oriented to person, place, and time. She appears well-developed and well-nourished.  HENT:  Head: Normocephalic and atraumatic.  Eyes: Conjunctivae are normal.  Neck: Normal range of motion.  Pulmonary/Chest: Effort normal.  Neurological: She is alert and oriented to person, place, and time.  Psychiatric: She has a normal mood and affect.  Nursing note and vitals reviewed.    UC Treatments / Results  Labs (all labs ordered are listed, but only abnormal results are displayed) Labs Reviewed  POCT URINALYSIS DIP (DEVICE)    EKG None  Radiology No results  found.  Procedures Procedures (including critical care time)  Medications Ordered in UC Medications - No data to display  Initial Impression / Assessment and Plan / UC Course  I have reviewed the triage vital signs and the nursing notes.  Pertinent labs & imaging results that were available during my care of the patient were reviewed by me and considered in my medical decision making (see chart for details).      Final Clinical Impressions(s) / UC Diagnoses   Final diagnoses:  None   Discharge Instructions   None    ED Prescriptions    None     Controlled Substance Prescriptions [place] Controlled Substance Registry consulted? Not Applicable   Jacqualine Mau, NP 09/17/17 1328    Jacqualine Mau, NP 09/17/17 1353

## 2017-09-22 ENCOUNTER — Other Ambulatory Visit: Payer: Self-pay | Admitting: Obstetrics and Gynecology

## 2017-09-22 NOTE — Telephone Encounter (Signed)
Medication refill request: Gabapentin Last AEX:  06/28/2017 Next AEX: 07/06/2018 Last MMG (if hormonal medication request): n/a Refill authorized #[id number], 3 refills, please advise.

## 2017-09-22 NOTE — Telephone Encounter (Signed)
Please call the patient and ask her how she is taking the Gabapentin. She was taking it only at night, but we discussed going up to tid. Please call in a refill until her annual exam in 6/20 with the correct amount to cover what she is taking.

## 2017-09-23 NOTE — Telephone Encounter (Signed)
Patient says she is taking the gabapentin twice a day.  It is helping her.  Refill sent in for gabapentin bid.  Patient aware.

## 2017-10-04 ENCOUNTER — Encounter: Payer: Self-pay | Admitting: Neurology

## 2017-10-06 ENCOUNTER — Ambulatory Visit (INDEPENDENT_AMBULATORY_CARE_PROVIDER_SITE_OTHER): Payer: 59 | Admitting: Neurology

## 2017-10-06 ENCOUNTER — Telehealth (INDEPENDENT_AMBULATORY_CARE_PROVIDER_SITE_OTHER): Payer: Self-pay | Admitting: Physical Medicine and Rehabilitation

## 2017-10-06 ENCOUNTER — Encounter: Payer: Self-pay | Admitting: Neurology

## 2017-10-06 VITALS — BP 116/72 | HR 60 | Ht 63.0 in | Wt 270.0 lb

## 2017-10-06 DIAGNOSIS — G4733 Obstructive sleep apnea (adult) (pediatric): Secondary | ICD-10-CM

## 2017-10-06 DIAGNOSIS — Z9989 Dependence on other enabling machines and devices: Secondary | ICD-10-CM | POA: Diagnosis not present

## 2017-10-06 NOTE — Progress Notes (Signed)
Subjective:    Patient ID: [name] is a 57 y.o. female.  HPI     Interim history:   [name] is a 57 year old right-handed woman with an underlying medical history of hypertension, hyperlipidemia, diabetes, arthritis, hypothyroidism, reflux disease, history of pancreatitis, deemed secondary to medication, degenerative spine disease with low back pain, lower extremity edema and severe obesity, who presents for follow-up consultation of her sleep apnea, well established on CPAP therapy. The patient is unaccompanied today. I last saw her on she reports doing well with CPAP. She had undergone laparoscopic gastric sleeve bariatric surgery on 08/23/2016. She had lost weight. We talked about potentially putting her back in for sleep study to reevaluate after weight loss.   Today, 10/06/2017: I reviewed her CPAP compliance data from 09/05/2017 through 10/04/2017 which is a total of 30 days, during which time she used her CPAP every night with percent used days greater than 4 hours at 97%, indicating excellent compliance with an average usage of 7 hours and 10 minutes, residual AHI 0.6 per hour, leak on the higher side for the 95th percentile at 26.3 mL/m on a pressure of 9 cm with EPR of 3. She reports doing well with her CPAP, up to date with her supplies typically. Using nasal pillows. Weight has plateaued a little bit. She has had recent issues with blurry vision and watery eyes, wonders if it's the Lyrica. She is going to talk to Dr. Ernestina Patches about this. Her GYN started her on low-dose gabapentin for night sweats. She has intermittent lower extremity edema and uses Lasix as needed. She may be due for an eye examination, goes to [facility].  The patient's allergies, current medications, family history, past medical history, past social history, past surgical history and problem list were reviewed and updated as appropriate.    Previously (copied from previous notes for reference):    I saw  her on 08/18/2015, at which time she was doing rather well on CPAP therapy and was fully compliant with it. She had undergone epidural steroid injections in the past for her back pain which were helpful.    I reviewed her CPAP compliance data from 09/05/2016 through 10/04/2016, which is a total of 30 days, during which time she used her CPAP every night with percent used days greater than 4 hours at 100%, indicating superb compliance with an average usage of 7 hours and 43 minutes, residual AHI low at 0.7 per hour, leak acceptable with the 95th percentile at 8.5 L/m on a pressure of 9 cm with EPR of 3.    I first met her on 12/04/2014 at the request of her primary care physician, at which time she reported a family history of OSA, snoring and excessive daytime somnolence. I invited her back for sleep study. She had a split-night sleep study on 01/03/2015. I went over her test results with her in detail today. Baseline sleep efficiency was 68.7% with a latency to sleep of 18 minutes and wake after sleep onset of 45.5 minutes with severe sleep fragmentation noted. She had arousal index of 9 per hour. She had an increased percentage of stage 1 sleep, an increased percentage of slow-wave sleep and REM sleep of 3.6% with a prolonged REM latency. She had no significant PLMS, EKG or EEG changes. She had moderate to loud snoring. Total AHI was 30.1 per hour, average oxygen saturation 91%, nadir was 64%. She was then titrated on CPAP. Sleep efficiency was 72.4% during the  second part of the study with sleep latency of 17.5 minutes and wake after sleep onset of 60 minutes with moderate sleep fragmentation noted. She had slow-wave sleep at 22.1%, REM sleep was 35.6%. Average oxygen saturation was 93%, nadir was 86%. CPAP was titrated from 5 cm to 9 cm. AHI was 1 per hour on the final pressure with supine REM sleep achieved. Based on her test results are prescribed CPAP therapy for home use at a pressure of 9 cm.    In  the interim, she was seen by Cecille Rubin on 02/19/2015, at which time she was fully compliant with CPAP therapy and doing well.    I reviewed her CPAP compliance data from 07/15/2015 through 08/13/2015 which is a total of 30 days during which time she used her machine every night with percent used days greater than 4 hours at 100%, indicating superb compliance with an average usage of 5 hours and 37 minutes, residual AHI 0.5 per hour, leak low with the 95th percentile at 3.4 L/m on a pressure of 9 cm with EPR of 3.    12/04/2014: She reports snoring and excessive daytime somnolence. I reviewed your office note from 11/12/2014, which you kindly included.   She went to the ER on 11/09/14, after she woke up in the middle of the night a couple with SOB and a sense of choking. She has a family history of obstructive sleep apnea in her older sister who uses a CPAP machine. During the emergency room visit she was noted to have more lower extremity swelling. She was given IV Lasix 1 and her maintenance Lasix was increased to 30 mg from 20 mg daily. She had a chest x-ray and then a CT angiogram of her chest which showed: 1. No acute cardiopulmonary disease. Specifically, no evidence of pulmonary embolism to the level of the bilateral subsegmental pulmonary arteries.  2. Borderline cardiomegaly. 3. Coronary artery calcifications. 4. Indeterminate punctate (approximately 2 mm) right middle lobe pulmonary nodule. If the patient is at high risk for bronchogenic carcinoma, follow-up chest CT at 1 year is recommended. If the patient is at low risk, no follow-up is needed. She reports a bedtime of around 10 PM. She falls asleep quickly. She is separated. Currently her 16 year old son lives with her but he is moving out. She has a 77 year old daughter who is on her own. 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 Her rise time is 6:30 AM. She does not wake up rested. Her Epworth sleepiness score is 11 out of 24  today, her fatigue score is 37 out of 63. She has restless leg symptoms. These are intermittent. She has occasional leg cramps as well. She has nocturia, usually once or twice per night. She does not drink caffeine daily. She quit smoking in 1992. She drinks alcohol very rarely.   Her Past Medical History Is Significant For: Past Medical History:  Diagnosis Date  . Anemia when she was young  . Arthritis   . Congestion of nasal sinus   . Constipation    takes Colace every other day  . DDD (degenerative disc disease), lumbar   . Diabetes mellitus without complication ([place])    per pt had been diagnosed but then "number started going down so my doctor said i didnt have diabetes"  . Dry eyes    uses Systane Eye drops daily as needed  . GERD (gastroesophageal reflux disease) 12/06/2011   takes Nexium daily  . Headache(784.0)  occasionally-d/t congestion  . Heart murmur   . History of bronchitis 6-7 yrs ago  . Hyperlipidemia   . Hypertension    takes Metoprolol and Diovan daily  . Hypothyroidism 12/06/2011   takes Synthroid daily  . Joint pain   . Multiple allergies    takes Zyrtec daily;uses Nasonex daily as needed  . Nocturia   . Numbness    weakness-right hand  . OSA on CPAP   . Pancreatitis   . Peripheral edema    takes Furosemide daily  . Shingles   . Urinary frequency   . Venous insufficiency of leg     Her Past Surgical History Is Significant For: Past Surgical History:  Procedure Laterality Date  . ABDOMINAL HYSTERECTOMY     uterine prolapse  . BACK SURGERY     fusion  . CARDIAC SURGERY    . CHOLECYSTECTOMY    . COLONOSCOPY    . ESOPHAGOGASTRODUODENOSCOPY  5-68yr ago  . FOOT SURGERY Bilateral   . KNEE ARTHROSCOPY    . LAPAROSCOPIC GASTRIC SLEEVE RESECTION N/A 08/23/2016   Procedure: LAPAROSCOPIC GASTRIC SLEEVE RESECTION WITH UPPER ENDO;  Surgeon: MJohnathan Hausen MD;  Location: [facility];  Service: General;  Laterality: N/A;  . SHOULDER ARTHROSCOPY WITH ROTATOR  CUFF REPAIR AND SUBACROMIAL DECOMPRESSION Right 08/23/2013   Procedure: RIGHT SHOULDER ARTHROSCOPY WITH  SUBACROMIAL DECOMPRESSION DISTAL CLAVICLE RESECTION AND  ROTATOR CUFF REPAIR ;  Surgeon: KMarin Shutter MD;  [place] [facility]  Service: Orthopedics;  Laterality: Right;  . TUBAL LIGATION      Her Family History Is Significant For: Family History  Problem Relation Age of Onset  . Hypertension Mother   . Hypertension Father   . Hypertension Sister   . Kidney disease Sister        [facility]  . Thyroid disease Sister   . Thyroid disease Sister   . Hypertension Sister   . Breast cancer Maternal Aunt        >50; passed away from it  . Breast cancer Maternal Aunt        >50    Her Social History Is Significant For: Social History   Socioeconomic History  . Marital status: Divorced    Spouse name: Not on file  . Number of children: 2  . Years of education: College  . Highest education level: Not on file  Occupational History    Employer: [employer]    Comment: [employer]  Social Needs  . Financial resource strain: Not on file  . Food insecurity:    Worry: Not on file    Inability: Not on file  . Transportation needs:    Medical: Not on file    Non-medical: Not on file  Tobacco Use  . Smoking status: Former Smoker    Packs/day: 0.75    Years: 40.00    Pack years: 30.00    Types: Cigarettes    Last attempt to quit: 1993    Years since quitting: 26.7  . Smokeless tobacco: Never Used  . Tobacco comment: quit smoking in 1993  Substance and Sexual Activity  . Alcohol use: Yes    Alcohol/week: 0.0 standard drinks    Comment: rarely;; mixed drink   . Drug use: No  . Sexual activity: Not Currently    Partners: Male    Birth control/protection: Surgical  Lifestyle  . Physical activity:    Days per week: Not on file    Minutes per session: Not on file  .  Stress: Not on file  Relationships  . Social connections:    Talks on phone: Not on file    Gets together:  Not on file    Attends religious service: Not on file    Active member of club or organization: Not on file    Attends meetings of clubs or organizations: Not on file    Relationship status: Not on file  Other Topics Concern  . Not on file  Social History Narrative   Caffeine: Coffee    Her Allergies Are:  Allergies  Allergen Reactions  . Metronidazole     Developed pancreatitis after taking this  . Adhesive [Tape] Itching and Rash  . Latex Itching and Rash  . Penicillins Rash    Has patient had a PCN reaction causing immediate rash, facial/tongue/throat swelling, SOB or lightheadedness with hypotension: No Has patient had a PCN reaction causing severe rash involving mucus membranes or skin necrosis: No Has patient had a PCN reaction that required hospitalization No Has patient had a PCN reaction occurring within the last 10 years: No If all of the above answers are "NO", then may proceed with Cephalosporin use.  Marland Kitchen Vesicare [Solifenacin] Rash  :   Her Current Medications Are:  Outpatient Encounter Medications as of 10/06/2017  Medication Sig  . albuterol (PROVENTIL HFA;VENTOLIN HFA) 108 (90 BASE) MCG/ACT inhaler Inhale 1-2 puffs into the lungs every 6 (six) hours as needed for wheezing or shortness of breath.  Marland Kitchen aspirin EC 81 MG tablet Take 81 mg by mouth daily.   . betamethasone valerate ointment (VALISONE) 0.1 % Apply a pea sized amount topically BID for up to 2 weeks. Not for daily long term use.  . Biotin 5000 MCG CAPS Take 5,000 mcg by mouth daily.  . diazepam (VALIUM) 5 MG tablet Take 0.5-1 tablets (2.5-5 mg total) by mouth every 6 (six) hours as needed for muscle spasms or sedation.  . docusate sodium (COLACE) 100 MG capsule Take 100 mg by mouth daily.   Marland Kitchen esomeprazole (NEXIUM) 40 MG capsule Take 40 mg by mouth daily.   . fluticasone (FLONASE) 50 MCG/ACT nasal spray Place 1 spray into both nostrils daily as needed for allergies.   . furosemide (LASIX) 20 MG tablet Take 3  tablets (60 mg total) by mouth daily. NEED OV. (Patient taking differently: Take 60 mg by mouth as needed. NEED OV.)  . gabapentin (NEURONTIN) 100 MG capsule Take 1 capsule (100 mg total) by mouth 2 (two) times daily.  Marland Kitchen ibuprofen (ADVIL,MOTRIN) 600 MG tablet   . levothyroxine (SYNTHROID, LEVOTHROID) 75 MCG tablet Take 75 mcg by mouth daily before breakfast.  . lisinopril (PRINIVIL,ZESTRIL) 20 MG tablet Take 1 tablet (20 mg total) by mouth daily.  Marland Kitchen loratadine (CLARITIN) 10 MG tablet Take 10 mg by mouth daily.  . magnesium oxide (MAG-OX) 400 MG tablet Take 400 mg by mouth daily.  . meloxicam (MOBIC) 15 MG tablet   . metoprolol tartrate (LOPRESSOR) 25 MG tablet Take 25 mg by mouth 2 (two) times daily.  . Omega-3 Fatty Acids (FISH OIL) 1000 MG CAPS Take 1,000 mg by mouth daily.  . ondansetron (ZOFRAN ODT) 4 MG disintegrating tablet Take 1 tablet (4 mg total) by mouth every 8 (eight) hours as needed for nausea or vomiting.  Vladimir Faster Glycol-Propyl Glycol (SYSTANE OP) Place 1 drop into both eyes daily as needed (dry eyes).   . pregabalin (LYRICA) 150 MG capsule TAKE ONE CAPSULE BY MOUTH 2 TIMES A DAY  .  vitamin C (ASCORBIC ACID) 500 MG tablet Take 500 mg by mouth daily.  . potassium chloride SA (KLOR-CON M20) 20 MEQ tablet Take 1 tablet (20 mEq total) by mouth daily. (Patient taking differently: Take 20 mEq by mouth as needed. )   No facility-administered encounter medications on file as of 10/06/2017.   :  Review of Systems:  Out of a complete 14 point review of systems, all are reviewed and negative with the exception of these symptoms as listed below:  Review of Systems  Neurological:       Pt presents today to discuss her cpap. Pt reports that her cpap is going well.    Objective:  Neurological Exam  Physical Exam Physical Examination:   Vitals:   10/06/17 1507  BP: 116/72  Pulse: 60   General Examination: The patient is a very pleasant 57 y.o. female in no acute distress. She  appears well-developed and well-nourished and well groomed.   HEENT:Normocephalic, atraumatic, pupils are equal, round and reactive to light and accommodation. Extraocular tracking is good without limitation to gaze excursion or nystagmus noted. Corrective eyeglasses in place. She has mild bilateral cataracts. Normal smooth pursuit is noted. Hearing is grossly intact. Face is symmetric with normal facial animation and normal facial sensation. Speech is clear with no dysarthria noted. There is no hypophonia. There is no lip, neck/head, jaw or voice tremor. Neck is supple with full range of passive and active motion. There are no carotid bruits on auscultation. Oropharynx exam reveals: mild mouth dryness. Tongue protrudes centrally and palate elevates symmetrically.   Chest:Clear to auscultation without wheezing, rhonchi or crackles noted.  Heart:S1+S2+0, regular and normal without murmurs, rubs or gallops noted.   Abdomen:Soft, non-tender and non-distended with normal bowel sounds appreciated on auscultation.  Extremities:There is trace pitting edema in the distal lower extremities around the ankles.   Skin: Warm and dry without trophic changes noted. There are no varicose veins.  Musculoskeletal: exam reveals no obvious joint deformities, tenderness or joint swelling or erythema.   Neurologically:  Mental status: The patient is awake, alert and oriented in all 4 spheres. Her immediate and remote memory, attention, language skills and fund of knowledge are appropriate. There is no evidence of aphasia, agnosia, apraxia or anomia. Speech is clear with normal prosody and enunciation. Thought process is linear. Mood is normal and affect is normal.  Cranial nerves II - XII are as described above under HEENT exam.  Motor exam: Normal bulk, strength and tone is noted. There is no drift, tremor or rebound. Romberg is negative. Fine motor skills and coordination: grossly intact.  Cerebellar  testing: No dysmetria or intention tremor. There is no truncal or gait ataxia.  Sensory exam: intact to light touch in the upper and lower extremities.  Gait, station and balance: She stands easily. No veering to one side is noted. No leaning to one side is noted. Posture is age-appropriate and stance is narrow based. Gait shows normal stride length and normal pace. No problems turning are noted.   Assessment and Plan:   In summary, CEYLIN DREIBELBIS is a very pleasant 57 year old female with an underlying medical history of hypertension, hyperlipidemia, diabetes, arthritis, hypothyroidism, reflux disease, history of pancreatitis, deemed secondary to medication, degenerative spine disease with low back painwith history of ESIs, lower extremity edema and morbid obesity, whopresents for follow-up consultation of her obstructive sleep apnea, well established on CPAP therapy at 9 cm with full compliance and ongoing good results. She has  undergone weight loss surgery last year. She had a sleep study testing on 01/03/2015. She has ongoing good results with CPAP therapy and is motivated to continue with treatment. She is commended for her excellent compliance. She is encouraged to continue to work on weight loss. She is encouraged to follow-up with her eye doctor for blurry vision and watery eyes. She was told by Dr. Ernestina Patches that it could be related to the Lyrica and she has cut back on it, just takes it at night at this point. Nevertheless, she may also have cataracts that should be monitored and could be in part causing her eye problems. From the sleep apnea standpoint she is doing well, she is advised to follow-up in one year, she can see one of our nurse practitioners. I answered all her questions today and she was in agreement. I spent 20 minutes in total face-to-face time with the patient, more than 50% of which was spent in counseling and coordination of care, reviewing test results, reviewing medication  and discussing or reviewing the diagnosis of OSA, its prognosis and treatment options. Pertinent laboratory and imaging test results that were available during this visit with the patient were reviewed by me and considered in my medical decision making (see chart for details).

## 2017-10-06 NOTE — Patient Instructions (Signed)
Please continue using your CPAP regularly. While your insurance requires that you use CPAP at least 4 hours each night on 70% of the nights, I recommend, that you not skip any nights and use it throughout the night if you can. Getting used to CPAP and staying with the treatment long term does take time and patience and discipline. Untreated obstructive sleep apnea when it is moderate to severe can have an adverse impact on cardiovascular health and raise her risk for heart disease, arrhythmias, hypertension, congestive heart failure, stroke and diabetes. Untreated obstructive sleep apnea causes sleep disruption, nonrestorative sleep, and sleep deprivation. This can have an impact on your day to day functioning and cause daytime sleepiness and impairment of cognitive function, memory loss, mood disturbance, and problems focussing. Using CPAP regularly can improve these symptoms.  Weight has been stable, please work on weight loss.  Have your eyes checked.   Keep up the good work! We can have you see Ward Givens, NP in one year.

## 2017-10-07 ENCOUNTER — Telehealth (INDEPENDENT_AMBULATORY_CARE_PROVIDER_SITE_OTHER): Payer: Self-pay | Admitting: Radiology

## 2017-10-07 NOTE — Telephone Encounter (Signed)
Patient called again requesting for different medication than Lyrica due to problems with eyes.  cb # [phone]

## 2017-10-11 ENCOUNTER — Telehealth (INDEPENDENT_AMBULATORY_CARE_PROVIDER_SITE_OTHER): Payer: Self-pay

## 2017-10-11 ENCOUNTER — Other Ambulatory Visit (INDEPENDENT_AMBULATORY_CARE_PROVIDER_SITE_OTHER): Payer: Self-pay | Admitting: Physical Medicine and Rehabilitation

## 2017-10-11 MED ORDER — PREGABALIN 75 MG PO CAPS: ORAL_CAPSULE | ORAL | 0 refills | Status: DC

## 2017-10-11 NOTE — Telephone Encounter (Signed)
Ok, just need OV at some point to discuss, she can stay on gabapentin

## 2017-10-11 NOTE — Telephone Encounter (Signed)
Please advise 

## 2017-10-11 NOTE — Telephone Encounter (Signed)
She said that she is taking Gabapentin 100 mg BID.

## 2017-10-11 NOTE — Telephone Encounter (Signed)
I have been trying to think what to do with her, we need to wean her off the Lyrica first. She is on 150mg  capsule I believe. I will rx 75mg  caps to take instead for 2 weeks BID then only at night for 1 week.  Instructions on rx.  I need OV with her as well. ? How much gabapentin is she taking?

## 2017-10-11 NOTE — Telephone Encounter (Signed)
Called pharmacy and they will dispense #42.

## 2017-10-11 NOTE — Telephone Encounter (Signed)
[facility] called needing clarification on quantity of Lyrica prescribed for patient. She said that she calculates patient mya need #42. Please call to advise. [phone]

## 2017-10-11 NOTE — Telephone Encounter (Signed)
Yes please call them as I want to wean her off the lyrica so they can fill enough to make RX work out, I wanted 2 per day for 2 weeks  And then one per day for two weeks so 28 + 14 equals 42 - Bingo. I wrote generic 30.

## 2017-10-14 ENCOUNTER — Other Ambulatory Visit: Payer: Self-pay | Admitting: Internal Medicine

## 2017-10-14 DIAGNOSIS — M461 Sacroiliitis, not elsewhere classified: Secondary | ICD-10-CM | POA: Diagnosis not present

## 2017-10-14 DIAGNOSIS — Z01818 Encounter for other preprocedural examination: Secondary | ICD-10-CM | POA: Diagnosis not present

## 2017-10-14 DIAGNOSIS — Z1231 Encounter for screening mammogram for malignant neoplasm of breast: Secondary | ICD-10-CM

## 2017-10-14 DIAGNOSIS — I10 Essential (primary) hypertension: Secondary | ICD-10-CM | POA: Diagnosis not present

## 2017-10-14 DIAGNOSIS — E1169 Type 2 diabetes mellitus with other specified complication: Secondary | ICD-10-CM | POA: Diagnosis not present

## 2017-10-14 DIAGNOSIS — E038 Other specified hypothyroidism: Secondary | ICD-10-CM | POA: Diagnosis not present

## 2017-10-14 DIAGNOSIS — E7849 Other hyperlipidemia: Secondary | ICD-10-CM | POA: Diagnosis not present

## 2017-10-14 DIAGNOSIS — J181 Lobar pneumonia, unspecified organism: Secondary | ICD-10-CM | POA: Diagnosis not present

## 2017-10-14 DIAGNOSIS — K219 Gastro-esophageal reflux disease without esophagitis: Secondary | ICD-10-CM | POA: Diagnosis not present

## 2017-10-14 DIAGNOSIS — Z23 Encounter for immunization: Secondary | ICD-10-CM | POA: Diagnosis not present

## 2017-10-19 ENCOUNTER — Encounter (INDEPENDENT_AMBULATORY_CARE_PROVIDER_SITE_OTHER): Payer: Self-pay | Admitting: Physical Medicine and Rehabilitation

## 2017-10-19 ENCOUNTER — Ambulatory Visit (INDEPENDENT_AMBULATORY_CARE_PROVIDER_SITE_OTHER): Payer: 59 | Admitting: Physical Medicine and Rehabilitation

## 2017-10-19 VITALS — BP 141/86 | HR 56

## 2017-10-19 DIAGNOSIS — H538 Other visual disturbances: Secondary | ICD-10-CM | POA: Diagnosis not present

## 2017-10-19 DIAGNOSIS — M792 Neuralgia and neuritis, unspecified: Secondary | ICD-10-CM

## 2017-10-19 DIAGNOSIS — M961 Postlaminectomy syndrome, not elsewhere classified: Secondary | ICD-10-CM

## 2017-10-19 DIAGNOSIS — G894 Chronic pain syndrome: Secondary | ICD-10-CM

## 2017-10-19 NOTE — Progress Notes (Signed)
Numeric Pain Rating Scale and Functional Assessment Average Pain 4 Pain Right Now 3 My pain is constant, dull and aching Pain is worse with: walking, sitting, inactivity and standing Pain improves with: rest, therapy/exercise, medication and injections   In the last MONTH (on 0-10 scale) has pain interfered with the following?  1. General activity like being  able to carry out your everyday physical activities such as walking, climbing stairs, carrying groceries, or moving a chair?  Rating(4)  2. Relation with others like being able to carry out your usual social activities and roles such as  activities at home, at work and in your community. Rating(0)  3. Enjoyment of life such that you have  been bothered by emotional problems such as feeling anxious, depressed or irritable?  Rating(0)

## 2017-10-19 NOTE — Telephone Encounter (Signed)
error 

## 2017-10-19 NOTE — Progress Notes (Signed)
[name] - 57 y.o. female MRN [id number]  Date of birth: [birth year]  Office Visit Note: Visit Date: 10/19/2017 PCP: Velna Hatchet, MD Referred by: Velna Hatchet, MD  Subjective: Chief Complaint  Patient presents with  . Lower Back - Pain  . Blurred Vision    New onset blurry vision   HPI: [name] is a 57 year old female that we have seen over the last several years with good results from intermittent sacroiliac joint injection at least on a couple of occasions S1 transforaminal injection.  She has had prior lumbar fusion with chronic back pain and chronic pain syndrome.  She had done well taking Lyrica and we had actually bump the dose up over time 150 mg twice a day.  In general she had tolerated this medicine better than other medications.  She had tried gabapentin as well as Topamax in the past.  Her complaints are bilateral low back pain and buttock pain without pain down the legs and no paresthesias.  No focal weakness no new trauma.  Her biggest complaint coming in today was blurry vision that is felt to be related to the Lyrica.  She has seen an ophthalmologist who felt like her eyes were doing well and was no structural problem or nerve problem.  She is not a diabetic but does have a history of hypothyroid.  She also is status post lap banding and is lost 50 pounds.  She tries to stay active with exercise.  She does have some swelling in the legs but that is been ongoing problem she does take a diuretic.  She has had hypertension and pulmonary hypertension.  Over the phone we had reduced her Lyrica down to 75 mg twice per day and the blurry vision has been improved significantly.  Last injection performed a few months ago has done quite well on her back pain is in decent control at this point.  She is taking gabapentin 100 mg twice daily that was given to her for hot flashes.   Review of Systems  Constitutional: Negative for chills, fever, malaise/fatigue and weight  loss.  HENT: Negative for hearing loss and sinus pain.   Eyes: Positive for blurred vision. Negative for double vision and photophobia.  Respiratory: Negative for cough and shortness of breath.   Cardiovascular: Negative for chest pain, palpitations and leg swelling.  Gastrointestinal: Negative for abdominal pain, nausea and vomiting.  Genitourinary: Negative for flank pain.  Musculoskeletal: Positive for back pain. Negative for myalgias.  Skin: Negative for itching and rash.  Neurological: Negative for tremors, focal weakness and weakness.  Endo/Heme/Allergies: Negative.   Psychiatric/Behavioral: Negative for depression.  All other systems reviewed and are negative.  Otherwise per HPI.  Assessment & Plan: Visit Diagnoses:  1. Post laminectomy syndrome   2. Chronic pain syndrome   3. Blurry vision, bilateral   4. Morbid obesity ([place])   5. Neuropathic pain     Plan: Findings:  Chronic recalcitrant low back pain under decent control using Lyrica and intermittent sacroiliac joint injection.  She has been seeing a rheumatologist in work there is ongoing.  She has not been placed on any new medications.  Started having some blurry vision and this was looked at by her eye doctor who determined she had no structural or nerve related issues.  She does not have diabetes.  Blurry vision can be a known side effect of the Lyrica.  We are at a pretty decent dose of Lyrica at 300 mg a  day.  We have reduce this to 75 mg twice a day and the blurry vision has reduced so I think that proves that the source of the symptoms.  I have seen this in the past.  Is typically something that happens early on in the use of the medication.  I think the best plan right now since she is in good control is to keep her at 75 mg twice a day and start to slowly increase the gabapentin with the goal of working up to gabapentin 300 mg 3 times a day while likely reducing the Lyrica.  She does well just on the gabapentin we may not  need the Lyrica.  She reports the other time that she tried gabapentin she may have started to fast and it made her very sleepy and we had a long discussion about that.  For right now she is going to start up in the gabapentin and will take over the prescription for that.  She will stay at Lyrica at the current dose and then will slowly taper off that.  If her vision changes or gets worse she is to let us know.    Meds & Orders: No orders of the defined types were placed in this encounter.  No orders of the defined types were placed in this encounter.   Follow-up: Return if symptoms worsen or fail to improve.   Procedures: No procedures performed  No notes on file   Clinical History: Lumbar spine MRI dated 06/11/2014 IMPRESSION: Interval fusion L3-4 L4-5. Posterior decompression at these levels without stenosis.  Progressive facet degeneration and mild spinal stenosis at L2-3.  Progressive facet degeneration on the left at L5-S1 with left foraminal encroachment.    She reports that she quit smoking about 26 years ago. Her smoking use included cigarettes. She has a 30.00 pack-year smoking history. She has never used smokeless tobacco. No results for input(s): HGBA1C, LABURIC in the last 8760 hours.  Objective:  VS:  HT:    WT:   BMI:     BP:(!) 141/86  HR:(!) 56bpm  TEMP: ( )  RESP:  Physical Exam  Constitutional: She is oriented to person, place, and time. She appears well-developed and well-nourished. No distress.  Obese  HENT:  Head: Normocephalic and atraumatic.  Nose: Nose normal.  Mouth/Throat: Oropharynx is clear and moist.  Eyes: Pupils are equal, round, and reactive to light. Conjunctivae are normal.  Wears glasses  Neck: Normal range of motion. Neck supple. No JVD present.  Cardiovascular: Regular rhythm and intact distal pulses.  Pulmonary/Chest: Effort normal. No respiratory distress.  Abdominal: She exhibits no distension. There is no guarding.    Musculoskeletal:  Patient ambulates without aid.  Somewhat slow to rise from a seated position to full extension.  She is stiff the lumbar spine with some paraspinal tenderness with taut bands.  Pain over the PSIS bilaterally no pain over the greater trochanter and good distal strength.  Neurological: She is alert and oriented to person, place, and time. She exhibits normal muscle tone. Coordination normal.  Skin: Skin is warm. No rash noted. No erythema.  Psychiatric: She has a normal mood and affect. Her behavior is normal.  Nursing note and vitals reviewed.   Ortho Exam Imaging: No results found.  Past Medical/Family/Surgical/Social History: Medications & Allergies reviewed per EMR, new medications updated. Patient Active Problem List   Diagnosis Date Noted  . Bilateral primary osteoarthritis of knee 12/27/2016  . 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 Prediabetes 12/03/2015  . OSA on CPAP 02/19/2015  . Morbid obesity ([place]) 10/09/2014  . Pulmonary HTN ([place]) 05/12/2013  . Bilateral lower extremity edema 05/12/2013  . Morbid obesity with BMI of 50.0-59.9, adult ([place])   . Back pain, hx of prior back surgery 12/09/2011  . Pancreatitis, mild, CT negative 12/08/2011  . Fatty liver 12/08/2011  . Substernal chest pain, suspected secondary to acute pancreatitis 12/06/2011  . Hypertension 12/06/2011  . Hypokalemia 12/06/2011  . Hypothyroidism 12/06/2011  . GERD (gastroesophageal reflux disease) 12/06/2011  . Anxiety 12/06/2011  . BV (bacterial vaginosis)   . Hyperlipidemia    Past Medical History:  Diagnosis Date  . Anemia when she was young  . Arthritis   . Congestion of nasal sinus   . Constipation    takes Colace every other day  . DDD (degenerative disc disease), lumbar   . Diabetes mellitus without complication ([place])    per pt had been diagnosed but then "number started going down so my doctor said i didnt have diabetes"  . Dry eyes    uses Systane  Eye drops daily as needed  . GERD (gastroesophageal reflux disease) 12/06/2011   takes Nexium daily  . Headache(784.0)    occasionally-d/t congestion  . Heart murmur   . History of bronchitis 6-7 yrs ago  . Hyperlipidemia   . Hypertension    takes Metoprolol and Diovan daily  . Hypothyroidism 12/06/2011   takes Synthroid daily  . Joint pain   . Multiple allergies    takes Zyrtec daily;uses Nasonex daily as needed  . Nocturia   . Numbness    weakness-right hand  . OSA on CPAP   . Pancreatitis   . Peripheral edema    takes Furosemide daily  . Shingles   . Urinary frequency   . Venous insufficiency of leg    Family History  Problem Relation Age of Onset  . Hypertension Mother   . Hypertension Father   . Hypertension Sister   . Kidney disease Sister        [facility] TRANSPLANT  . Thyroid disease Sister   . Thyroid disease Sister   . Hypertension Sister   . Breast cancer Maternal Aunt        >50; passed away from it  . Breast cancer Maternal Aunt        >50   Past Surgical History:  Procedure Laterality Date  . ABDOMINAL HYSTERECTOMY     uterine prolapse  . BACK SURGERY     fusion  . CARDIAC SURGERY    . CHOLECYSTECTOMY    . COLONOSCOPY    . ESOPHAGOGASTRODUODENOSCOPY  5-55yrs ago  . FOOT SURGERY Bilateral   . KNEE ARTHROSCOPY    . LAPAROSCOPIC GASTRIC SLEEVE RESECTION N/A 08/23/2016   Procedure: LAPAROSCOPIC GASTRIC SLEEVE RESECTION WITH UPPER ENDO;  Surgeon: Johnathan Hausen, MD;  Location: [facility];  Service: General;  Laterality: N/A;  . SHOULDER ARTHROSCOPY WITH ROTATOR CUFF REPAIR AND SUBACROMIAL DECOMPRESSION Right 08/23/2013   Procedure: RIGHT SHOULDER ARTHROSCOPY WITH  SUBACROMIAL DECOMPRESSION DISTAL CLAVICLE RESECTION AND  ROTATOR CUFF REPAIR ;  Surgeon: Marin Shutter, MD;  Location: [facility];  Service: Orthopedics;  Laterality: Right;  . TUBAL LIGATION     Social History   Occupational History    Employer: [employer]    Comment: [employer]  Tobacco Use  .  Smoking status: Former Smoker    Packs/day: 0.75    Years: 40.00    Pack  years: 30.00    Types: Cigarettes    Last attempt to quit: 1993    Years since quitting: 26.7  . Smokeless tobacco: Never Used  . Tobacco comment: quit smoking in 1993  Substance and Sexual Activity  . Alcohol use: Yes    Alcohol/week: 0.0 standard drinks    Comment: rarely;; mixed drink   . Drug use: No  . Sexual activity: Not Currently    Partners: Male    Birth control/protection: Surgical

## 2017-10-20 ENCOUNTER — Other Ambulatory Visit (INDEPENDENT_AMBULATORY_CARE_PROVIDER_SITE_OTHER): Payer: Self-pay | Admitting: Physical Medicine and Rehabilitation

## 2017-10-20 NOTE — Telephone Encounter (Signed)
Please advise 

## 2017-10-25 ENCOUNTER — Telehealth: Payer: Self-pay | Admitting: *Deleted

## 2017-10-25 NOTE — Telephone Encounter (Signed)
   [facility] Pre-operative Risk Assessment    Request for surgical clearance:  1. What type of surgery is being performed? LEFT FOOT BUNION ECTOMY AND METATARSAL EXOSTECTOMY  2. When is this surgery scheduled? 11-04-17   3. What type of clearance is required (medical clearance vs. Pharmacy clearance to hold med vs. Both)? CARDIAC  4. Are there any medications that need to be held prior to surgery and how long? NA  5. Practice name and name of physician performing surgery? [facility]  6. What is your office phone number [phone]   7.   What is your office fax number [phone]  8.   Anesthesia type (None, local, MAC, general) ? BLOCK   [name] 10/25/2017, 9:14 AM  _________________________________________________________________   (provider comments below)

## 2017-10-25 NOTE — Telephone Encounter (Signed)
   Primary Cardiologist:Mihai Croitoru, MD  Chart reviewed as part of pre-operative protocol coverage. The patient as last seen 09/2014. Needs new provider appointment.   Because of Janiene B Roberts's past medical history and time since last visit, he/she will require a follow-up visit in order to better assess preoperative cardiovascular risk.  Pre-op covering staff: - Please schedule appointment and call patient to inform them. - Please contact requesting surgeon's office via preferred method (i.e, phone, fax) to inform them of need for appointment prior to surgery.  [place], [place]  10/25/2017, 1:41 PM

## 2017-10-25 NOTE — Telephone Encounter (Signed)
Left detailed message for pt re: surgical clearance. Pt will need to call and make an appt in order to get clearance.

## 2017-10-27 ENCOUNTER — Encounter: Payer: Self-pay | Admitting: Cardiovascular Disease

## 2017-10-27 NOTE — Telephone Encounter (Signed)
SPOKE WITH PT AND PT WORKS AT [facility] OFFICE   AND HAD CONTACTED HER PRIMARY CARDIOLOGIST DR VFAWNOPW AND HIS HANDLING THE MATTER.

## 2017-10-31 ENCOUNTER — Telehealth: Payer: Self-pay | Admitting: Podiatry

## 2017-10-31 NOTE — Telephone Encounter (Signed)
I'm having a really bad painful feeling at my right ankle above my arch. I was wondering if Dr. Milinda Pointer could see me to see what is going on. I'm scheduled to have surgery with him on my left foot this Friday. Please call me back at [phone].

## 2017-11-02 ENCOUNTER — Other Ambulatory Visit: Payer: Self-pay | Admitting: Podiatry

## 2017-11-02 ENCOUNTER — Telehealth: Payer: Self-pay | Admitting: *Deleted

## 2017-11-02 MED ORDER — ONDANSETRON HCL 4 MG PO TABS: 4.0000 mg | ORAL_TABLET | Freq: Three times a day (TID) | ORAL | 0 refills | Status: DC | PRN

## 2017-11-02 MED ORDER — CLINDAMYCIN HCL 150 MG PO CAPS: 150.0000 mg | ORAL_CAPSULE | Freq: Three times a day (TID) | ORAL | 0 refills | Status: DC

## 2017-11-02 MED ORDER — OXYCODONE-ACETAMINOPHEN 10-325 MG PO TABS: 1.0000 | ORAL_TABLET | Freq: Three times a day (TID) | ORAL | 0 refills | Status: AC | PRN

## 2017-11-02 NOTE — Telephone Encounter (Signed)
I called and informed the patient that we received medical clearance from Dr. Sallyanne Kuster.  I also informed her that the physician said she could stop taking the aspirin.  She said she stopped it two weeks ago.  She asked if she was supposed to stop taking her arthritis medicine, Meloxicam.  I told her yes, the doctors recommend not taking any anti-inflammatories.  I told her it was okay to take Tylenol if she could tolerate it.  She said she could.

## 2017-11-03 ENCOUNTER — Ambulatory Visit (INDEPENDENT_AMBULATORY_CARE_PROVIDER_SITE_OTHER): Payer: 59

## 2017-11-03 ENCOUNTER — Encounter: Payer: Self-pay | Admitting: Podiatry

## 2017-11-03 ENCOUNTER — Ambulatory Visit (INDEPENDENT_AMBULATORY_CARE_PROVIDER_SITE_OTHER): Payer: 59 | Admitting: Podiatry

## 2017-11-03 ENCOUNTER — Other Ambulatory Visit: Payer: Self-pay | Admitting: Podiatry

## 2017-11-03 DIAGNOSIS — M7751 Other enthesopathy of right foot: Secondary | ICD-10-CM | POA: Diagnosis not present

## 2017-11-03 DIAGNOSIS — M76821 Posterior tibial tendinitis, right leg: Secondary | ICD-10-CM

## 2017-11-03 DIAGNOSIS — M7750 Other enthesopathy of unspecified foot: Secondary | ICD-10-CM

## 2017-11-03 NOTE — Progress Notes (Signed)
She presents today chief complaint of pain to the medial foot right states that is been aching and swelling times a week or so.  No injuries that she knows of.  She is scheduled for surgery tomorrow on the left foot.  Objective: Vital signs are stable she is alert and oriented x3.  Pulses are palpable.  Neurologic sensorium is intact degenerative flexors are intact muscle strength is normal symmetrical.  Orthopedic evaluation demonstrates pain on inversion of the posterior tibial tendon against resistance.  There is some fluctuance within the tendon sheath.  Radiographs taken today do not demonstrate any type of osseous abnormalities other than soft tissue increase in density along the course of the posterior tibial tendon.  Assessment: Posterior tibial tendinitis right mild pes planus right.  Plan: Discussed etiology pathology and surgical therapies at this point time after sterile Betadine skin prep injected 20 mg Kenalog 5 mg Marcaine point maximal tenderness.  Ensure not to inject into the tendon itself tolerated procedure well without complications.  I will follow-up with her in the near future for surgical intervention.  I would like to go ahead and place her in a Tri-Lock brace

## 2017-11-04 ENCOUNTER — Encounter: Payer: Self-pay | Admitting: Podiatry

## 2017-11-04 DIAGNOSIS — M25572 Pain in left ankle and joints of left foot: Secondary | ICD-10-CM | POA: Diagnosis not present

## 2017-11-04 DIAGNOSIS — M21542 Acquired clubfoot, left foot: Secondary | ICD-10-CM | POA: Diagnosis not present

## 2017-11-04 DIAGNOSIS — M216X2 Other acquired deformities of left foot: Secondary | ICD-10-CM | POA: Diagnosis not present

## 2017-11-04 DIAGNOSIS — D1632 Benign neoplasm of short bones of left lower limb: Secondary | ICD-10-CM | POA: Diagnosis not present

## 2017-11-04 DIAGNOSIS — M25775 Osteophyte, left foot: Secondary | ICD-10-CM | POA: Diagnosis not present

## 2017-11-04 DIAGNOSIS — M2012 Hallux valgus (acquired), left foot: Secondary | ICD-10-CM | POA: Diagnosis not present

## 2017-11-04 DIAGNOSIS — M2042 Other hammer toe(s) (acquired), left foot: Secondary | ICD-10-CM | POA: Diagnosis not present

## 2017-11-04 DIAGNOSIS — I10 Essential (primary) hypertension: Secondary | ICD-10-CM | POA: Diagnosis not present

## 2017-11-07 ENCOUNTER — Telehealth: Payer: Self-pay | Admitting: Podiatry

## 2017-11-07 NOTE — Telephone Encounter (Signed)
I called pt and informed of Dr. Stephenie Acres orders and pt states she will just get some applesauce and try to take It crushed. Pt asked how to ice the surgery foot. I told her she could lay the ice on the top or either side of the ankle and that would help with the swelling.

## 2017-11-07 NOTE — Telephone Encounter (Signed)
I had surgery on Friday with Dr. Milinda Pointer. He prescribed clindamycin 150 capsule but they are getting stuck in my chest. Could he prescribe a regular pill? I can be reached at [phone].

## 2017-11-07 NOTE — Telephone Encounter (Signed)
Patient is wanting to switch medication from capsule to a regular pill. She is unable to swallow the capsule. She also has questions about icing her foot. Please give her a call

## 2017-11-07 NOTE — Telephone Encounter (Signed)
Try doxycycline 100mg  bid.  Could try to crush clinda in apple sauce.

## 2017-11-10 ENCOUNTER — Ambulatory Visit (INDEPENDENT_AMBULATORY_CARE_PROVIDER_SITE_OTHER): Payer: Self-pay | Admitting: Podiatry

## 2017-11-10 ENCOUNTER — Telehealth: Payer: Self-pay | Admitting: Podiatry

## 2017-11-10 ENCOUNTER — Ambulatory Visit (INDEPENDENT_AMBULATORY_CARE_PROVIDER_SITE_OTHER): Payer: 59

## 2017-11-10 VITALS — BP 144/66 | HR 66 | Temp 96.6°F

## 2017-11-10 DIAGNOSIS — M76821 Posterior tibial tendinitis, right leg: Secondary | ICD-10-CM | POA: Diagnosis not present

## 2017-11-10 DIAGNOSIS — M67472 Ganglion, left ankle and foot: Secondary | ICD-10-CM

## 2017-11-10 MED ORDER — HYDROMORPHONE HCL 2 MG PO TABS: 2.0000 mg | ORAL_TABLET | ORAL | 0 refills | Status: DC | PRN

## 2017-11-10 NOTE — Telephone Encounter (Signed)
"  I need a refill on my pain medication, sent to [facility]".

## 2017-11-17 ENCOUNTER — Ambulatory Visit (INDEPENDENT_AMBULATORY_CARE_PROVIDER_SITE_OTHER): Payer: 59 | Admitting: Podiatry

## 2017-11-17 DIAGNOSIS — M67472 Ganglion, left ankle and foot: Secondary | ICD-10-CM

## 2017-11-17 DIAGNOSIS — M21619 Bunion of unspecified foot: Secondary | ICD-10-CM

## 2017-11-17 DIAGNOSIS — M2042 Other hammer toe(s) (acquired), left foot: Secondary | ICD-10-CM

## 2017-11-17 MED ORDER — HYDROMORPHONE HCL 2 MG PO TABS: 2.0000 mg | ORAL_TABLET | Freq: Four times a day (QID) | ORAL | 0 refills | Status: DC | PRN

## 2017-11-17 NOTE — Progress Notes (Signed)
Patient is here today for follow-up appointment, surgery performed [facility] bunionectomy with screw, metatarsal osteotomy with screw second toe left foot, dorsal tarsal exostectomy left foot, procedure performed on 11/04/2017.  She states that her foot throbs at times she is having some comp occasions moving around in the boot, right now she is taking her pain medication patient as she needs to to manage her pain.  Noted well-healing surgical sites, all sutures were intact.  There is some mild swelling to the foot and toes, but this is within normal limits for this type of procedure.  There is no erythema, no redness, and no drainage coming from the surgical sites.  Patient was also evaluated by Dr. Milinda Pointer, he reviewed her x-rays and discussed postoperative ambulation with her.  Patient is stable vital signs are also stable, blood pressure 144/66, temperature 96.6.  Advised patient to remain in her boot at all times, keep her foot dry, pain meds were refilled at this visit.  We also discussed signs and symptoms of infection and importance of reporting symptoms immediately.  She is to follow-up next week for reevaluation and possible suture removal.

## 2017-11-20 NOTE — Progress Notes (Signed)
Subjective: [name] is a 57 y.o. is seen today in office s/p [facility] bunionectomy with screw fixation, second metatarsal osteotomy, dorsal exostectomy left foot preformed on 10/25/2017 Dr. Milinda Pointer.  She states that overall her pain is improving and she feels better than she did last week.  She is still in the surgical boot.  She presents here for possible suture removal.  Denies any systemic complaints such as fevers, chills, nausea, vomiting. No calf pain, chest pain, shortness of breath.   Objective: General: No acute distress, AAOx3  DP/PT pulses palpable 2/4, CRT < 3 sec to all digits.  Protective sensation intact. Motor function intact.  LEFT foot: Incision is well coapted without any evidence of dehiscence and sutures are intact. There is no surrounding erythema, ascending cellulitis, fluctuance, crepitus, malodor, drainage/purulence. There is mild edema around the surgical site. There is mild pain along the surgical site.  Incisions appear to be healing well and signs of infection with sutures not quite ready to be removed. No other areas of tenderness to bilateral lower extremities.  No other open lesions or pre-ulcerative lesions.  No pain with calf compression, swelling, warmth, erythema.   Assessment and Plan:  Status post left foot surgery, doing well with no complications   -Treatment options discussed including all alternatives, risks, and complications -Sutures are not quite ready to be removed.  I left an intact today.  Anabolic ointment and a bandage was applied.  Keep the dressing clean, dry, intact. -Remain in the cam boot and weightbearing status per Dr. Milinda Pointer. -Ice/elevation -Pain medication as needed. -Monitor for any clinical signs or symptoms of infection and DVT/PE and directed to call the office immediately should any occur or go to the ER. -Follow-up in 1 week for possible suture removal or sooner if any problems arise. In the meantime, encouraged to call the  office with any questions, concerns, change in symptoms.   Celesta Gentile, DPM

## 2017-11-24 ENCOUNTER — Ambulatory Visit (INDEPENDENT_AMBULATORY_CARE_PROVIDER_SITE_OTHER): Payer: 59 | Admitting: Podiatry

## 2017-11-24 ENCOUNTER — Ambulatory Visit (INDEPENDENT_AMBULATORY_CARE_PROVIDER_SITE_OTHER): Payer: 59

## 2017-11-24 ENCOUNTER — Telehealth (INDEPENDENT_AMBULATORY_CARE_PROVIDER_SITE_OTHER): Payer: Self-pay | Admitting: Physical Medicine and Rehabilitation

## 2017-11-24 DIAGNOSIS — M21619 Bunion of unspecified foot: Secondary | ICD-10-CM

## 2017-11-24 DIAGNOSIS — M2042 Other hammer toe(s) (acquired), left foot: Secondary | ICD-10-CM | POA: Diagnosis not present

## 2017-11-24 MED ORDER — HYDROMORPHONE HCL 2 MG PO TABS: 2.0000 mg | ORAL_TABLET | Freq: Four times a day (QID) | ORAL | 0 refills | Status: DC | PRN

## 2017-11-24 NOTE — Progress Notes (Signed)
She presents today for follow-up of her [place] bunionectomy with screw left.  Second metatarsal osteotomy second left.  And dorsal tarsal exostectomy left.  Date of surgery 11/04/2017 states that she is doing much better as the swelling improves.  Objective: Vital signs are stable alert and oriented x3.  Pulses are palpable.  Neurologic sensorium is intact.  Degenerative flexors are intact.  Muscle strength is normal and symmetrical.  Orthopedic evaluation demonstrates all joints distal to the ankle full range of motion without crepitation.  Radiographs demonstrate well healing osteotomies with internal fixation in good position.  Assessment: Well-healing surgical foot.  Plan: Place her in a compression anklet and a Darco shoe we will follow-up with her in 2 to 3 weeks for another set of x-rays.

## 2017-11-25 ENCOUNTER — Other Ambulatory Visit (INDEPENDENT_AMBULATORY_CARE_PROVIDER_SITE_OTHER): Payer: Self-pay | Admitting: Physical Medicine and Rehabilitation

## 2017-11-25 MED ORDER — GABAPENTIN 300 MG PO CAPS: 300.0000 mg | ORAL_CAPSULE | Freq: Two times a day (BID) | ORAL | 1 refills | Status: DC

## 2017-11-25 NOTE — Telephone Encounter (Signed)
Let her know since she was up 2 in am and 3 pm I switched to 300 mg capsule to take BID

## 2017-11-25 NOTE — Telephone Encounter (Signed)
Called patient to advise  °

## 2017-12-01 ENCOUNTER — Other Ambulatory Visit: Payer: Self-pay | Admitting: Podiatry

## 2017-12-01 ENCOUNTER — Telehealth: Payer: Self-pay | Admitting: Podiatry

## 2017-12-01 MED ORDER — HYDROMORPHONE HCL 4 MG PO TABS: 4.0000 mg | ORAL_TABLET | ORAL | 0 refills | Status: AC | PRN

## 2017-12-01 NOTE — Telephone Encounter (Signed)
Unable to leave a message the mailbox is full. °

## 2017-12-01 NOTE — Telephone Encounter (Signed)
Pt called states she is taking the dilaudid every 6-8 hours and not all the time, continues to have shooting pain through the big toe to the little toe. Pt states she is also taking Meloxicam and icing as needed.

## 2017-12-01 NOTE — Telephone Encounter (Signed)
I'm needing a refill on my dilaudid because I'm about out. My number is [phone] if you need to talk with me. Thank you.

## 2017-12-05 ENCOUNTER — Ambulatory Visit (INDEPENDENT_AMBULATORY_CARE_PROVIDER_SITE_OTHER): Payer: 59 | Admitting: Orthopaedic Surgery

## 2017-12-05 ENCOUNTER — Encounter (INDEPENDENT_AMBULATORY_CARE_PROVIDER_SITE_OTHER): Payer: Self-pay | Admitting: Orthopaedic Surgery

## 2017-12-05 VITALS — BP 132/81 | HR 59 | Ht 63.0 in | Wt 270.0 lb

## 2017-12-05 DIAGNOSIS — M170 Bilateral primary osteoarthritis of knee: Secondary | ICD-10-CM

## 2017-12-05 MED ORDER — BUPIVACAINE HCL 0.5 % IJ SOLN
2.0000 mL | INTRAMUSCULAR | Status: AC | PRN
  Administered 2017-12-05: 2 mL via INTRA_ARTICULAR

## 2017-12-05 MED ORDER — METHYLPREDNISOLONE ACETATE 40 MG/ML IJ SUSP
80.0000 mg | INTRAMUSCULAR | Status: AC | PRN
  Administered 2017-12-05: 80 mg

## 2017-12-05 MED ORDER — LIDOCAINE HCL 1 % IJ SOLN
2.0000 mL | INTRAMUSCULAR | Status: AC | PRN
  Administered 2017-12-05: 2 mL

## 2017-12-05 NOTE — Progress Notes (Signed)
Office Visit Note   Patient: [name]           Date of Birth: 10-Oct-1960           MRN: [id number] Visit Date: 12/05/2017              Requested by: Velna Hatchet, MD [address] [place], [place] [address] PCP: Velna Hatchet, MD   Assessment & Plan: Visit Diagnoses:  1. Bilateral primary osteoarthritis of knee     Plan: Bilateral knee osteoarthritis.  More symptomatic on the right.  Will inject with cortisone.  Office 2 weeks to inject left knee  Follow-Up Instructions: Return in about 2 weeks (around 12/19/2017).   Orders:  No orders of the defined types were placed in this encounter.  No orders of the defined types were placed in this encounter.     Procedures: Large Joint Inj: R knee on 12/05/2017 9:47 AM Indications: pain and diagnostic evaluation Details: 25 G 1.5 in needle, anteromedial approach  Arthrogram: No  Medications: 2 mL lidocaine 1 %; 2 mL bupivacaine 0.5 %; 80 mg methylPREDNISolone acetate 40 MG/ML Procedure, treatment alternatives, risks and benefits explained, specific risks discussed. Consent was given by the patient. Immediately prior to procedure a time out was called to verify the correct patient, procedure, equipment, support staff and site/side marked as required. Patient was prepped and draped in the usual sterile fashion.       Clinical Data: No additional findings.   Subjective: Chief Complaint  Patient presents with  . Follow-up    BI LAT KNEE PAIN 2-3 MO NO INJURY, L AND R  KNEE VISCO INJECTIONS, STEROID INJECTION LAST INJECTION WAS 5 MO AGO,  [name] completed a series of Euflexxa 3 months ago and did well.  Having some recurrent symptoms of arthritis.  Would like to have a cortisone injection in the more symptomatic right knee.  Still working on her weight.  Is prediabetic  HPI  Review of Systems   Objective: Vital Signs: BP 132/81 (BP Location: Left Arm, Patient Position: Sitting, Cuff Size: Normal)    Pulse (!) 59   Ht 5\' 3"  (1.6 m)   Wt 270 lb (122.5 kg)   BMI 47.83 kg/m   Physical Exam  Constitutional: She is oriented to person, place, and time. She appears well-developed and well-nourished.  HENT:  Mouth/Throat: Oropharynx is clear and moist.  Eyes: Pupils are equal, round, and reactive to light. EOM are normal.  Pulmonary/Chest: Effort normal.  Neurological: She is alert and oriented to person, place, and time.  Skin: Skin is warm and dry.  Psychiatric: She has a normal mood and affect. Her behavior is normal.    Ortho Exam large knees.  Possibly small effusion right knee.  Mostly medial joint pain.  Flexed about 95 to 100 degrees without instability.  Full extension.  No instability.  No distal edema.  Good sensation  Specialty Comments:  No specialty comments available.  Imaging: No results found.   PMFS History: Patient Active Problem List   Diagnosis Date Noted  . Bilateral primary osteoarthritis of knee 12/27/2016  . 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 Prediabetes 12/03/2015  . OSA on CPAP 02/19/2015  . Morbid obesity ([place]) 10/09/2014  . Pulmonary HTN ([place]) 05/12/2013  . Bilateral lower extremity edema 05/12/2013  . Morbid obesity with BMI of 50.0-59.9, adult ([place])   . Back pain, hx of prior back surgery 12/09/2011  . Pancreatitis, mild, CT negative 12/08/2011  .  Fatty liver 12/08/2011  . Substernal chest pain, suspected secondary to acute pancreatitis 12/06/2011  . Hypertension 12/06/2011  . Hypokalemia 12/06/2011  . Hypothyroidism 12/06/2011  . GERD (gastroesophageal reflux disease) 12/06/2011  . Anxiety 12/06/2011  . BV (bacterial vaginosis)   . Hyperlipidemia    Past Medical History:  Diagnosis Date  . Anemia when she was young  . Arthritis   . Congestion of nasal sinus   . Constipation    takes Colace every other day  . DDD (degenerative disc disease), lumbar   . Diabetes mellitus without complication ([place])    per pt  had been diagnosed but then "number started going down so my doctor said i didnt have diabetes"  . Dry eyes    uses Systane Eye drops daily as needed  . GERD (gastroesophageal reflux disease) 12/06/2011   takes Nexium daily  . Headache(784.0)    occasionally-d/t congestion  . Heart murmur   . History of bronchitis 6-7 yrs ago  . Hyperlipidemia   . Hypertension    takes Metoprolol and Diovan daily  . Hypothyroidism 12/06/2011   takes Synthroid daily  . Joint pain   . Multiple allergies    takes Zyrtec daily;uses Nasonex daily as needed  . Nocturia   . Numbness    weakness-right hand  . OSA on CPAP   . Pancreatitis   . Peripheral edema    takes Furosemide daily  . Shingles   . Urinary frequency   . Venous insufficiency of leg     Family History  Problem Relation Age of Onset  . Hypertension Mother   . Hypertension Father   . Hypertension Sister   . Kidney disease Sister        [employer] TRANSPLANT  . Thyroid disease Sister   . Thyroid disease Sister   . Hypertension Sister   . Breast cancer Maternal Aunt        >50; passed away from it  . Breast cancer Maternal Aunt        >50    Past Surgical History:  Procedure Laterality Date  . ABDOMINAL HYSTERECTOMY     uterine prolapse  . BACK SURGERY     fusion  . CARDIAC SURGERY    . CHOLECYSTECTOMY    . COLONOSCOPY    . ESOPHAGOGASTRODUODENOSCOPY  5-94yrs ago  . FOOT SURGERY Bilateral   . KNEE ARTHROSCOPY    . LAPAROSCOPIC GASTRIC SLEEVE RESECTION N/A 08/23/2016   Procedure: LAPAROSCOPIC GASTRIC SLEEVE RESECTION WITH UPPER ENDO;  Surgeon: Johnathan Hausen, MD;  Location[facility];  Service: General;  Laterality: N/A;  . SHOULDER ARTHROSCOPY WITH ROTATOR CUFF REPAIR AND SUBACROMIAL DECOMPRESSION Right 08/23/2013   Procedure: RIGHT SHOULDER ARTHROSCOPY WITH  SUBACROMIAL DECOMPRESSION DISTAL CLAVICLE RESECTION AND  ROTATOR CUFF REPAIR ;  Surgeon: Marin Shutter, MD;  Location: [facility];  Service: Orthopedics;  Laterality: Right;  .  TUBAL LIGATION     Social History   Occupational History    Employer: [employer]    Comment: [employer]  Tobacco Use  . Smoking status: Former Smoker    Packs/day: 0.75    Years: 40.00    Pack years: 30.00    Types: Cigarettes    Last attempt to quit: 1993    Years since quitting: 26.8  . Smokeless tobacco: Never Used  . Tobacco comment: quit smoking in 1993  Substance and Sexual Activity  . Alcohol use: Yes    Alcohol/week: 0.0 standard drinks    Comment: rarely;;  mixed drink   . Drug use: No  . Sexual activity: Not Currently    Partners: Male    Birth control/protection: Surgical     Garald Balding, MD   Note - This record has been created using [employer].  Chart creation errors have been sought, but may not always  have been located. Such creation errors do not reflect on  the standard of medical care.

## 2017-12-05 NOTE — Progress Notes (Deleted)
   Procedure Note  Patient: [name]             Date of Birth: Mar 01, 1960           MRN: [id number]             Visit Date: 12/05/2017  Procedures: Visit Diagnoses: Bilateral primary osteoarthritis of knee - Plan: Large Joint Inj: R knee  Large Joint Inj: R knee on 12/05/2017 9:24 AM Indications: pain and diagnostic evaluation Details: 25 G 1.5 in needle, anteromedial approach  Arthrogram: No  Medications: 2 mL bupivacaine 0.5 %; 2 mL lidocaine 1 %; 80 mg methylPREDNISolone acetate 40 MG/ML Procedure, treatment alternatives, risks and benefits explained, specific risks discussed. Consent was given by the patient. Immediately prior to procedure a time out was called to verify the correct patient, procedure, equipment, support staff and site/side marked as required. Patient was prepped and draped in the usual sterile fashion.

## 2017-12-05 NOTE — Progress Notes (Deleted)
Office Visit Note   Patient: [name]           Date of Birth: 1960-05-21           MRN: [id number] Visit Date: 12/05/2017              Requested by: Velna Hatchet, MD [address] [place],  [address] PCP: Velna Hatchet, MD   Assessment & Plan: Visit Diagnoses: No diagnosis found.  Plan: ***  Follow-Up Instructions: No follow-ups on file.   Orders:  No orders of the defined types were placed in this encounter.  No orders of the defined types were placed in this encounter.     Procedures: No procedures performed   Clinical Data: No additional findings.   Subjective: Chief Complaint  Patient presents with  . Follow-up    BI LAT KNEE PAIN 2-3 MO NO INJURY, L AND R  KNEE VISCO INJECTIONS, STEROID INJECTION LAST INJECTION WAS 5 MO AGO,    HPI  Review of Systems  Constitutional: Negative for fatigue and fever.  HENT: Negative for ear pain.   Eyes: Negative for pain.  Respiratory: Negative for cough and shortness of breath.   Cardiovascular: Positive for leg swelling.  Gastrointestinal: Positive for constipation and diarrhea.  Genitourinary: Negative for difficulty urinating.  Musculoskeletal: Positive for back pain. Negative for neck pain.  Skin: Negative for rash.  Allergic/Immunologic: Negative for food allergies.  Neurological: Positive for weakness. Negative for numbness.  Hematological: Bruises/bleeds easily.  Psychiatric/Behavioral: Positive for sleep disturbance.     Objective: Vital Signs: There were no vitals taken for this visit.  Physical Exam  Ortho Exam  Specialty Comments:  No specialty comments available.  Imaging: No results found.   PMFS History: Patient Active Problem List   Diagnosis Date Noted  . Bilateral primary osteoarthritis of knee 12/27/2016  . 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 Prediabetes 12/03/2015  . OSA on CPAP 02/19/2015  . Morbid obesity ([place]) 10/09/2014  .  Pulmonary HTN ([place]) 05/12/2013  . Bilateral lower extremity edema 05/12/2013  . Morbid obesity with BMI of 50.0-59.9, adult ([facility])   . Back pain, hx of prior back surgery 12/09/2011  . Pancreatitis, mild, CT negative 12/08/2011  . Fatty liver 12/08/2011  . Substernal chest pain, suspected secondary to acute pancreatitis 12/06/2011  . Hypertension 12/06/2011  . Hypokalemia 12/06/2011  . Hypothyroidism 12/06/2011  . GERD (gastroesophageal reflux disease) 12/06/2011  . Anxiety 12/06/2011  . BV (bacterial vaginosis)   . Hyperlipidemia    Past Medical History:  Diagnosis Date  . Anemia when she was young  . Arthritis   . Congestion of nasal sinus   . Constipation    takes Colace every other day  . DDD (degenerative disc disease), lumbar   . Diabetes mellitus without complication ([place])    per pt had been diagnosed but then "number started going down so my doctor said i didnt have diabetes"  . Dry eyes    uses Systane Eye drops daily as needed  . GERD (gastroesophageal reflux disease) 12/06/2011   takes Nexium daily  . Headache(784.0)    occasionally-d/t congestion  . Heart murmur   . History of bronchitis 6-7 yrs ago  . Hyperlipidemia   . Hypertension    takes Metoprolol and Diovan daily  . Hypothyroidism 12/06/2011   takes Synthroid daily  . Joint pain   . Multiple allergies    takes Zyrtec daily;uses Nasonex daily as needed  . Nocturia   .  Numbness    weakness-right hand  . OSA on CPAP   . Pancreatitis   . Peripheral edema    takes Furosemide daily  . Shingles   . Urinary frequency   . Venous insufficiency of leg     Family History  Problem Relation Age of Onset  . Hypertension Mother   . Hypertension Father   . Hypertension Sister   . Kidney disease Sister        [facility] TRANSPLANT  . Thyroid disease Sister   . Thyroid disease Sister   . Hypertension Sister   . Breast cancer Maternal Aunt        >50; passed away from it  . Breast cancer Maternal Aunt         >50    Past Surgical History:  Procedure Laterality Date  . ABDOMINAL HYSTERECTOMY     uterine prolapse  . BACK SURGERY     fusion  . CARDIAC SURGERY    . CHOLECYSTECTOMY    . COLONOSCOPY    . ESOPHAGOGASTRODUODENOSCOPY  5-46yrs ago  . FOOT SURGERY Bilateral   . KNEE ARTHROSCOPY    . LAPAROSCOPIC GASTRIC SLEEVE RESECTION N/A 08/23/2016   Procedure: LAPAROSCOPIC GASTRIC SLEEVE RESECTION WITH UPPER ENDO;  Surgeon: Johnathan Hausen, MD;  [facility];  Service: General;  Laterality: N/A;  . SHOULDER ARTHROSCOPY WITH ROTATOR CUFF REPAIR AND SUBACROMIAL DECOMPRESSION Right 08/23/2013   Procedure: RIGHT SHOULDER ARTHROSCOPY WITH  SUBACROMIAL DECOMPRESSION DISTAL CLAVICLE RESECTION AND  ROTATOR CUFF REPAIR ;  Surgeon: Marin Shutter, MD;  Location: [facility];  Service: Orthopedics;  Laterality: Right;  . TUBAL LIGATION     Social History   Occupational History    Employer: [employer]    Comment: [employer]  Tobacco Use  . Smoking status: Former Smoker    Packs/day: 0.75    Years: 40.00    Pack years: 30.00    Types: Cigarettes    Last attempt to quit: 1993    Years since quitting: 26.8  . Smokeless tobacco: Never Used  . Tobacco comment: quit smoking in 1993  Substance and Sexual Activity  . Alcohol use: Yes    Alcohol/week: 0.0 standard drinks    Comment: rarely;; mixed drink   . Drug use: No  . Sexual activity: Not Currently    Partners: Male    Birth control/protection: Surgical

## 2017-12-08 ENCOUNTER — Ambulatory Visit (INDEPENDENT_AMBULATORY_CARE_PROVIDER_SITE_OTHER): Payer: 59

## 2017-12-08 ENCOUNTER — Ambulatory Visit (INDEPENDENT_AMBULATORY_CARE_PROVIDER_SITE_OTHER): Payer: 59 | Admitting: Podiatry

## 2017-12-08 ENCOUNTER — Encounter: Payer: Self-pay | Admitting: Podiatry

## 2017-12-08 DIAGNOSIS — M216X2 Other acquired deformities of left foot: Secondary | ICD-10-CM

## 2017-12-08 DIAGNOSIS — M2012 Hallux valgus (acquired), left foot: Secondary | ICD-10-CM

## 2017-12-08 DIAGNOSIS — M898X7 Other specified disorders of bone, ankle and foot: Secondary | ICD-10-CM | POA: Diagnosis not present

## 2017-12-08 DIAGNOSIS — Z9889 Other specified postprocedural states: Secondary | ICD-10-CM

## 2017-12-11 NOTE — Progress Notes (Signed)
She presents today date of surgery November 04, 2017 status post Liane Comber bunionectomy with screw fixation left second metatarsal osteotomy with screw left and a dorsal tarsal exostectomy left.  She states that is doing better finally.  Denies fever chills nausea vomiting muscle aches pains calf pain back pain chest pain shortness of breath.  Objective: Vital signs are stable she is alert and oriented x3.  Moderate edema to the surgical site.  She still has pain on range of motion of the first metatarsophalangeal joint of the second toe is slightly elevated.  Radiographs taken today demonstrate bone is not healed significantly as of yet capital fragments in good position screws are in good position.  Assessment: Well-healing surgical foot.  Plan: At this point we are going to have to wait for bone healing prior to her going back to work.  I am going to follow-up with her again in another few weeks for another set of x-rays prior to her going back to work.  We need to extend out of work benefits for a bit longer.

## 2017-12-16 ENCOUNTER — Ambulatory Visit
Admission: RE | Admit: 2017-12-16 | Discharge: 2017-12-16 | Disposition: A | Discharge status: Home / Self Care | Payer: 59 | Source: Ambulatory Visit | Attending: Internal Medicine | Admitting: Internal Medicine

## 2017-12-16 DIAGNOSIS — Z1231 Encounter for screening mammogram for malignant neoplasm of breast: Secondary | ICD-10-CM | POA: Diagnosis not present

## 2017-12-19 ENCOUNTER — Encounter (INDEPENDENT_AMBULATORY_CARE_PROVIDER_SITE_OTHER): Payer: Self-pay | Admitting: Orthopaedic Surgery

## 2017-12-19 ENCOUNTER — Encounter (INDEPENDENT_AMBULATORY_CARE_PROVIDER_SITE_OTHER): Payer: Self-pay

## 2017-12-19 ENCOUNTER — Ambulatory Visit (INDEPENDENT_AMBULATORY_CARE_PROVIDER_SITE_OTHER): Payer: 59 | Admitting: Orthopaedic Surgery

## 2017-12-19 VITALS — BP 139/90 | HR 68

## 2017-12-19 DIAGNOSIS — M170 Bilateral primary osteoarthritis of knee: Secondary | ICD-10-CM

## 2017-12-19 MED ORDER — LIDOCAINE HCL 1 % IJ SOLN
2.0000 mL | INTRAMUSCULAR | Status: AC | PRN
  Administered 2017-12-19: 2 mL

## 2017-12-19 MED ORDER — BUPIVACAINE HCL 0.5 % IJ SOLN
2.0000 mL | INTRAMUSCULAR | Status: AC | PRN
  Administered 2017-12-19: 2 mL via INTRA_ARTICULAR

## 2017-12-19 MED ORDER — METHYLPREDNISOLONE ACETATE 40 MG/ML IJ SUSP
80.0000 mg | INTRAMUSCULAR | Status: AC | PRN
  Administered 2017-12-19: 80 mg

## 2017-12-19 NOTE — Progress Notes (Signed)
Office Visit Note   Patient: [name]           Date of Birth: [birth year]           MRN: [id number] Visit Date: 12/19/2017              Requested by: Velna Hatchet, MD [address] [place], [place] [address] PCP: Velna Hatchet, MD   Assessment & Plan: Visit Diagnoses:  1. Bilateral primary osteoarthritis of knee     Plan: Steroid injection left knee.  Excellent result with cortisone injection right knee 2 weeks ago  Follow-Up Instructions: Return if symptoms worsen or fail to improve.   Orders:  No orders of the defined types were placed in this encounter.  No orders of the defined types were placed in this encounter.     Procedures: Large Joint Inj: L knee on 12/19/2017 9:26 AM Indications: pain and diagnostic evaluation Details: 25 G 1.5 in needle, anteromedial approach  Arthrogram: No  Medications: 2 mL lidocaine 1 %; 2 mL bupivacaine 0.5 %; 80 mg methylPREDNISolone acetate 40 MG/ML Procedure, treatment alternatives, risks and benefits explained, specific risks discussed. Consent was given by the patient. Patient was prepped and draped in the usual sterile fashion.       Clinical Data: No additional findings.   Subjective: Chief Complaint  Patient presents with  . Left Knee - Pain, Follow-up    Patient is here to get Left knee injection.  Mrs. [name] was seen several weeks ago bilateral knee pain.  Films demonstrated significant osteoarthritis.  I injected the right knee with cortisone she notes it made a big difference.  She like to have a cortisone injection of her left knee today  HPI  Review of Systems   Objective: Vital Signs: BP 139/90   Pulse 68   Physical Exam  Constitutional: She is oriented to person, place, and time. She appears well-developed and well-nourished.  HENT:  Mouth/Throat: Oropharynx is clear and moist.  Eyes: Pupils are equal, round, and reactive to light. EOM are normal.  Pulmonary/Chest: Effort  normal.  Neurological: She is alert and oriented to person, place, and time.  Skin: Skin is warm and dry.  Psychiatric: She has a normal mood and affect. Her behavior is normal.    Ortho Exam full extension left knee.  No obvious effusion.  Predominant medial joint pain.  Large knees.  Skin intact. knee was not hot warm or red Specialty Comments:  No specialty comments available.  Imaging: No results found.   PMFS History: Patient Active Problem List   Diagnosis Date Noted  . Bilateral primary osteoarthritis of knee 12/27/2016  . 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 Prediabetes 12/03/2015  . OSA on CPAP 02/19/2015  . Morbid obesity ([place]) 10/09/2014  . Pulmonary HTN ([place]) 05/12/2013  . Bilateral lower extremity edema 05/12/2013  . Morbid obesity with BMI of 50.0-59.9, adult ([place])   . Back pain, hx of prior back surgery 12/09/2011  . Pancreatitis, mild, CT negative 12/08/2011  . Fatty liver 12/08/2011  . Substernal chest pain, suspected secondary to acute pancreatitis 12/06/2011  . Hypertension 12/06/2011  . Hypokalemia 12/06/2011  . Hypothyroidism 12/06/2011  . GERD (gastroesophageal reflux disease) 12/06/2011  . Anxiety 12/06/2011  . BV (bacterial vaginosis)   . Hyperlipidemia    Past Medical History:  Diagnosis Date  . Anemia when she was young  . Arthritis   . Congestion of nasal sinus   . Constipation  takes Colace every other day  . DDD (degenerative disc disease), lumbar   . Diabetes mellitus without complication ([place])    per pt had been diagnosed but then "number started going down so my doctor said i didnt have diabetes"  . Dry eyes    uses Systane Eye drops daily as needed  . GERD (gastroesophageal reflux disease) 12/06/2011   takes Nexium daily  . Headache(784.0)    occasionally-d/t congestion  . Heart murmur   . History of bronchitis 6-7 yrs ago  . Hyperlipidemia   . Hypertension    takes Metoprolol and Diovan daily  .  Hypothyroidism 12/06/2011   takes Synthroid daily  . Joint pain   . Multiple allergies    takes Zyrtec daily;uses Nasonex daily as needed  . Nocturia   . Numbness    weakness-right hand  . OSA on CPAP   . Pancreatitis   . Peripheral edema    takes Furosemide daily  . Shingles   . Urinary frequency   . Venous insufficiency of leg     Family History  Problem Relation Age of Onset  . Hypertension Mother   . Hypertension Father   . Hypertension Sister   . Kidney disease Sister        [facility] TRANSPLANT  . Thyroid disease Sister   . Thyroid disease Sister   . Hypertension Sister   . Breast cancer Maternal Aunt        >50; passed away from it  . Breast cancer Maternal Aunt        >50    Past Surgical History:  Procedure Laterality Date  . ABDOMINAL HYSTERECTOMY     uterine prolapse  . BACK SURGERY     fusion  . CARDIAC SURGERY    . CHOLECYSTECTOMY    . COLONOSCOPY    . ESOPHAGOGASTRODUODENOSCOPY  5-50yrs ago  . FOOT SURGERY Bilateral   . KNEE ARTHROSCOPY    . LAPAROSCOPIC GASTRIC SLEEVE RESECTION N/A 08/23/2016   Procedure: LAPAROSCOPIC GASTRIC SLEEVE RESECTION WITH UPPER ENDO;  Surgeon: Johnathan Hausen, MD;  [facility];  Service: General;  Laterality: N/A;  . SHOULDER ARTHROSCOPY WITH ROTATOR CUFF REPAIR AND SUBACROMIAL DECOMPRESSION Right 08/23/2013   Procedure: RIGHT SHOULDER ARTHROSCOPY WITH  SUBACROMIAL DECOMPRESSION DISTAL CLAVICLE RESECTION AND  ROTATOR CUFF REPAIR ;  Surgeon: Marin Shutter, MD;  Location: [facility];  Service: Orthopedics;  Laterality: Right;  . TUBAL LIGATION     Social History   Occupational History    Employer: [employer]    Comment: [employer]  Tobacco Use  . Smoking status: Former Smoker    Packs/day: 0.75    Years: 40.00    Pack years: 30.00    Types: Cigarettes    Last attempt to quit: 1993    Years since quitting: 26.9  . Smokeless tobacco: Never Used  . Tobacco comment: quit smoking in 1993  Substance and Sexual Activity  . Alcohol  use: Yes    Alcohol/week: 0.0 standard drinks    Comment: rarely;; mixed drink   . Drug use: No  . Sexual activity: Not Currently    Partners: Male    Birth control/protection: Surgical     Garald Balding, MD   Note - This record has been created using [employer].  Chart creation errors have been sought, but may not always  have been located. Such creation errors do not reflect on  the standard of medical care.

## 2018-01-03 ENCOUNTER — Telehealth (INDEPENDENT_AMBULATORY_CARE_PROVIDER_SITE_OTHER): Payer: Self-pay | Admitting: Physical Medicine and Rehabilitation

## 2018-01-03 NOTE — Telephone Encounter (Signed)
Scheduled for 12/11 at 1000.

## 2018-01-03 NOTE — Telephone Encounter (Signed)
Yes ok 

## 2018-01-04 ENCOUNTER — Ambulatory Visit (INDEPENDENT_AMBULATORY_CARE_PROVIDER_SITE_OTHER): Payer: 59 | Admitting: Physical Medicine and Rehabilitation

## 2018-01-04 ENCOUNTER — Encounter (INDEPENDENT_AMBULATORY_CARE_PROVIDER_SITE_OTHER): Payer: Self-pay | Admitting: Physical Medicine and Rehabilitation

## 2018-01-04 ENCOUNTER — Ambulatory Visit (INDEPENDENT_AMBULATORY_CARE_PROVIDER_SITE_OTHER): Payer: Self-pay

## 2018-01-04 DIAGNOSIS — M461 Sacroiliitis, not elsewhere classified: Secondary | ICD-10-CM

## 2018-01-04 MED ORDER — METHYLPREDNISOLONE ACETATE 40 MG/ML IJ SUSP
40.0000 mg | INTRAMUSCULAR | Status: AC | PRN
  Administered 2018-01-04: 40 mg via INTRA_ARTICULAR

## 2018-01-04 MED ORDER — BUPIVACAINE HCL 0.5 % IJ SOLN
3.0000 mL | INTRAMUSCULAR | Status: AC | PRN
  Administered 2018-01-04: 3 mL via INTRA_ARTICULAR

## 2018-01-04 NOTE — Progress Notes (Signed)
  Numeric Pain Rating Scale and Functional Assessment Average Pain 8   In the last MONTH (on 0-10 scale) has pain interfered with the following?  1. General activity like being  able to carry out your everyday physical activities such as walking, climbing stairs, carrying groceries, or moving a chair?  Rating(10)   -Dye Allergies.  

## 2018-01-04 NOTE — Progress Notes (Signed)
   [name] - 57 y.o. female MRN [id number]  Date of birth: 1960/10/03  Office Visit Note: Visit Date: 01/04/2018 PCP: Velna Hatchet, MD Referred by: Velna Hatchet, MD  Subjective: Chief Complaint  Patient presents with  . Lower Back - Pain   HPI:  [name] is a 57 y.o. female who comes in today For planned bilateral sacroiliac joint injections.  Last injection performed was in September with really good relief of her symptoms up until recently.  No trauma.  She is dealing with left posterior tibial tendinitis and has had issues with ambulation with that as well as knee pain which she recently saw Dr. Durward Fortes for her knee.  She has lumbar surgery with fusion and we do maintain her on medication for this.  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  Has had some rheumatologic work-up in the past but I do think it something we need to address at some point again.  She may be a good candidate for spinal cord stimulator trial at some point.  ROS Otherwise per HPI.  Assessment & Plan: Visit Diagnoses:  1. Sacroiliitis ([place])     Plan: No additional findings.   Meds & Orders: No orders of the defined types were placed in this encounter.   Orders Placed This Encounter  Procedures  . Sacroiliac Joint Inj  . XR C-ARM NO REPORT    Follow-up: No follow-ups on file.   Procedures: Sacroiliac Joint Inj on 01/04/2018 5:47 PM Indications: pain and diagnostic evaluation Details: 22 G 3.5 in needle, fluoroscopy-guided posterior approach Medications (Right): 3 mL bupivacaine 0.5 %; 40 mg methylPREDNISolone acetate 40 MG/ML Medications (Left): 3 mL bupivacaine 0.5 %; 40 mg methylPREDNISolone acetate 40 MG/ML Outcome: tolerated well, no immediate complications  There was excellent flow of contrast producing a partial arthrogram of the sacroiliac joint.  Procedure, treatment alternatives, risks and benefits  explained, specific risks discussed. Consent was given by the patient. Immediately prior to procedure a time out was called to verify the correct patient, procedure, equipment, support staff and site/side marked as required. Patient was prepped and draped in the usual sterile fashion.      No notes on file   Clinical History: Lumbar spine MRI dated 06/11/2014 IMPRESSION: Interval fusion L3-4 L4-5. Posterior decompression at these levels without stenosis.  Progressive facet degeneration and mild spinal stenosis at L2-3.  Progressive facet degeneration on the left at L5-S1 with left foraminal encroachment.      Objective:  VS:  HT:    WT:   BMI:     BP:   HR: bpm  TEMP: ( )  RESP:  Physical Exam  Musculoskeletal:  Patient ambulates without aid.  She is slow to rise from a seated position.  She does have pain over the PSIS bilaterally.  Painful range of motion of the hip externally but not internally.    Ortho Exam Imaging: Xr C-arm No Report  Result Date: 01/04/2018 Please see Notes tab for imaging impression.

## 2018-01-10 ENCOUNTER — Ambulatory Visit (INDEPENDENT_AMBULATORY_CARE_PROVIDER_SITE_OTHER): Payer: Self-pay | Admitting: Podiatry

## 2018-01-10 ENCOUNTER — Ambulatory Visit (INDEPENDENT_AMBULATORY_CARE_PROVIDER_SITE_OTHER): Payer: 59

## 2018-01-10 ENCOUNTER — Encounter: Payer: Self-pay | Admitting: Podiatry

## 2018-01-10 DIAGNOSIS — M898X7 Other specified disorders of bone, ankle and foot: Secondary | ICD-10-CM

## 2018-01-10 DIAGNOSIS — Z9889 Other specified postprocedural states: Secondary | ICD-10-CM

## 2018-01-10 DIAGNOSIS — M2012 Hallux valgus (acquired), left foot: Secondary | ICD-10-CM

## 2018-01-10 DIAGNOSIS — M216X2 Other acquired deformities of left foot: Secondary | ICD-10-CM

## 2018-01-10 NOTE — Progress Notes (Signed)
[name] presents today date of surgery 11/04/2017 status post [facility] bunionectomy with screw left foot.  Metatarsal osteotomy with screw second left.  Dorsal tarsal exostectomy.  States that is still numb and I have a sharp shooting pain.  Objective: Vital signs are stable alert and oriented x3.  Pulses are palpable.  She still has swelling in the forefoot.  She has good range of motion of the first metatarsophalangeal joint left foot is still quite tender for her.  At this point I feel that she is probably not able to get back to work for another couple of weeks but hopefully should be able to get into a regular shoe since she does present today in her [place] shoe.  Assessment: At this point osseously she has gone on to heal uneventfully but still experiencing considerable pain with surgical foot.  Plan: At this point I would like to go ahead and consider postsurgical physical therapy to see if we can get some of the tenderness and soreness out of the foot.  She will start that immediately over the next few days we are going to continue her out of work until the first of the year.

## 2018-01-20 DIAGNOSIS — G4733 Obstructive sleep apnea (adult) (pediatric): Secondary | ICD-10-CM | POA: Diagnosis not present

## 2018-01-20 DIAGNOSIS — R339 Retention of urine, unspecified: Secondary | ICD-10-CM | POA: Diagnosis not present

## 2018-01-24 ENCOUNTER — Ambulatory Visit (INDEPENDENT_AMBULATORY_CARE_PROVIDER_SITE_OTHER): Payer: 59 | Admitting: Podiatry

## 2018-01-24 ENCOUNTER — Encounter: Payer: Self-pay | Admitting: Podiatry

## 2018-01-24 DIAGNOSIS — M216X2 Other acquired deformities of left foot: Secondary | ICD-10-CM

## 2018-01-24 DIAGNOSIS — M76821 Posterior tibial tendinitis, right leg: Secondary | ICD-10-CM | POA: Diagnosis not present

## 2018-01-24 DIAGNOSIS — Z9889 Other specified postprocedural states: Secondary | ICD-10-CM

## 2018-01-24 DIAGNOSIS — M898X7 Other specified disorders of bone, ankle and foot: Secondary | ICD-10-CM

## 2018-01-24 DIAGNOSIS — M2012 Hallux valgus (acquired), left foot: Secondary | ICD-10-CM

## 2018-01-24 MED ORDER — HYDROMORPHONE HCL 2 MG PO TABS: 2.0000 mg | ORAL_TABLET | ORAL | 0 refills | Status: DC | PRN

## 2018-01-24 NOTE — Progress Notes (Signed)
Presents today for follow-up of the right foot lateral side below the ankle states that the ankle brace is helping a little bit but I am having a flareup.  I would like to consider getting an injection if at all possible.  And I am starting to run out of my pain medicine if I can get a refill on that until this is resolved.  Objective: Vital signs are stable alert and oriented x3.  Pulses are palpable.  She has pain on palpation of the posterior tibial tendon as it inserts on the navicular tuberosity.  Assessment: Resolving posterior tibial tendinitis proximally resulting in posterior tibial tendinitis distally.  Plan: I injected today point of maximal tenderness with 2 mg of dexamethasone and local anesthetic.  Tolerated procedure well without complications extubated skin graft follow-up with her in a couple of months.  Continue to wear ankle brace.

## 2018-02-07 ENCOUNTER — Ambulatory Visit: Payer: 59

## 2018-02-07 ENCOUNTER — Encounter: Payer: Self-pay | Admitting: Podiatry

## 2018-02-07 ENCOUNTER — Ambulatory Visit (INDEPENDENT_AMBULATORY_CARE_PROVIDER_SITE_OTHER): Payer: 59 | Admitting: Podiatry

## 2018-02-07 DIAGNOSIS — M2012 Hallux valgus (acquired), left foot: Secondary | ICD-10-CM

## 2018-02-07 DIAGNOSIS — M216X2 Other acquired deformities of left foot: Secondary | ICD-10-CM

## 2018-02-07 DIAGNOSIS — M898X7 Other specified disorders of bone, ankle and foot: Secondary | ICD-10-CM

## 2018-02-07 DIAGNOSIS — M76821 Posterior tibial tendinitis, right leg: Secondary | ICD-10-CM

## 2018-02-07 DIAGNOSIS — Z9889 Other specified postprocedural states: Secondary | ICD-10-CM

## 2018-02-08 NOTE — Progress Notes (Signed)
She presents today for follow-up of posterior tibial tendinitis of the right foot.  States the left foot is doing okay the right foot is giving her grief the injection really just did not help at all.  Objective: Vital signs are stable she is alert oriented x3 she has edema to the right lower extremity however she has severe pain on palpation of the posterior tibial tendon of the right foot.  Fluctuance is noted within the tendon sheath.   Assessment: Posterior tibial tendinitis cannot rule out a tear at this point  Plan: At this point we will request a MRI for evaluation tear right.  May need to consider physical therapy or surgical intervention.

## 2018-02-10 ENCOUNTER — Telehealth: Payer: Self-pay | Admitting: Podiatry

## 2018-02-10 DIAGNOSIS — M76821 Posterior tibial tendinitis, right leg: Secondary | ICD-10-CM

## 2018-02-10 DIAGNOSIS — M2012 Hallux valgus (acquired), left foot: Secondary | ICD-10-CM

## 2018-02-10 NOTE — Telephone Encounter (Signed)
I informed pt I had ordered the MRI with [facility], and they would call her to schedule to set her appt about 5 business days from today.

## 2018-02-10 NOTE — Addendum Note (Signed)
Addended by: Harriett Sine D on: 02/10/2018 01:25 PM   Modules accepted: Orders

## 2018-02-10 NOTE — Telephone Encounter (Signed)
I reviewed the LOV 02/07/2018 and Dr. Milinda Pointer had wanted to order MRI right ankle, but orders had not been sent to me. Orders to Gretta Arab, RN for pre-cert, faxed to [facility].

## 2018-02-10 NOTE — Telephone Encounter (Signed)
Pt was seen on 1/14 and discussed getting an MRI done. Pt following up to make sure she is still going to be getting an MRI.

## 2018-02-14 DIAGNOSIS — I10 Essential (primary) hypertension: Secondary | ICD-10-CM | POA: Diagnosis not present

## 2018-02-14 DIAGNOSIS — J45998 Other asthma: Secondary | ICD-10-CM | POA: Diagnosis not present

## 2018-02-14 DIAGNOSIS — E1169 Type 2 diabetes mellitus with other specified complication: Secondary | ICD-10-CM | POA: Diagnosis not present

## 2018-02-14 DIAGNOSIS — Z6841 Body Mass Index (BMI) 40.0 and over, adult: Secondary | ICD-10-CM | POA: Diagnosis not present

## 2018-02-14 DIAGNOSIS — M1990 Unspecified osteoarthritis, unspecified site: Secondary | ICD-10-CM | POA: Diagnosis not present

## 2018-02-14 DIAGNOSIS — G4733 Obstructive sleep apnea (adult) (pediatric): Secondary | ICD-10-CM | POA: Diagnosis not present

## 2018-02-14 DIAGNOSIS — E039 Hypothyroidism, unspecified: Secondary | ICD-10-CM | POA: Diagnosis not present

## 2018-02-14 DIAGNOSIS — J309 Allergic rhinitis, unspecified: Secondary | ICD-10-CM | POA: Diagnosis not present

## 2018-02-18 ENCOUNTER — Ambulatory Visit (HOSPITAL_COMMUNITY)
Admission: RE | Admit: 2018-02-18 | Discharge: 2018-02-18 | Disposition: A | Discharge status: Home / Self Care | Payer: 59 | Source: Ambulatory Visit | Attending: Podiatry | Admitting: Podiatry

## 2018-02-18 DIAGNOSIS — M25571 Pain in right ankle and joints of right foot: Secondary | ICD-10-CM | POA: Diagnosis not present

## 2018-02-18 DIAGNOSIS — M76821 Posterior tibial tendinitis, right leg: Secondary | ICD-10-CM | POA: Insufficient documentation

## 2018-02-22 ENCOUNTER — Telehealth: Payer: Self-pay | Admitting: *Deleted

## 2018-02-22 NOTE — Telephone Encounter (Signed)
I informed pt of Dr. Stephenie Acres review of results and request to send copy of MRI disc to radiology specialist for more details to plan treatment, there would be a 10 to 14 day delay in final results and once received we would call with instructions. Pt states understanding.

## 2018-02-22 NOTE — Telephone Encounter (Signed)
-----   Message from Garrel Ridgel, [place] sent at 02/21/2018  7:18 AM EST ----- Send for over read

## 2018-02-22 NOTE — Telephone Encounter (Signed)
Faxed request for MRI disc copy from machine in [facility] to [facility] [place]: Tedra Coupe.

## 2018-02-27 NOTE — Telephone Encounter (Signed)
Received copy of MRI disc and mailed to [facility]. 

## 2018-03-07 DIAGNOSIS — Z0100 Encounter for examination of eyes and vision without abnormal findings: Secondary | ICD-10-CM | POA: Diagnosis not present

## 2018-03-09 ENCOUNTER — Encounter (INDEPENDENT_AMBULATORY_CARE_PROVIDER_SITE_OTHER): Payer: Self-pay | Admitting: Orthopaedic Surgery

## 2018-03-09 ENCOUNTER — Ambulatory Visit (INDEPENDENT_AMBULATORY_CARE_PROVIDER_SITE_OTHER): Payer: 59 | Admitting: Orthopaedic Surgery

## 2018-03-09 ENCOUNTER — Telehealth (INDEPENDENT_AMBULATORY_CARE_PROVIDER_SITE_OTHER): Payer: Self-pay | Admitting: Orthopaedic Surgery

## 2018-03-09 VITALS — BP 116/63 | HR 53 | Ht 63.0 in | Wt 261.0 lb

## 2018-03-09 DIAGNOSIS — M170 Bilateral primary osteoarthritis of knee: Secondary | ICD-10-CM

## 2018-03-09 NOTE — Progress Notes (Signed)
Office Visit Note   Patient: [name]           Date of Birth: [birth year]           MRN: [id number] Visit Date: 03/09/2018              Requested by: Velna Hatchet, MD [address] [place], [place] [address] PCP: Velna Hatchet, MD   Assessment & Plan: Visit Diagnoses:  1. Bilateral primary osteoarthritis of knee     Plan: Will inject left knee with cortisone as it has helped in the past and precertified Visco supplementation for both knees  Follow-Up Instructions: Return precert visco.   Orders:  No orders of the defined types were placed in this encounter.  No orders of the defined types were placed in this encounter.     Procedures: No procedures performed   Clinical Data: No additional findings.   Subjective: Chief Complaint  Patient presents with  . Left Knee - Follow-up  Mrs. [name] has an established diagnosis of osteoarthritis of both knees.  She has had successful cortisone injections in the past and even a course of Visco supplementation over a year ago.  She is having a bit more trouble with the left knee than the right the left knee was last injected about 2-1/2 months ago.  She like to have another cortisone injection and then requested Visco supplementation for both knees  HPI  Review of Systems   Objective: Vital Signs: BP 116/63   Pulse (!) 53   Ht 5\' 3"  (1.6 m)   Wt 261 lb (118.4 kg)   BMI 46.23 kg/m   Physical Exam Constitutional:      Appearance: She is well-developed.  Eyes:     Pupils: Pupils are equal, round, and reactive to light.  Pulmonary:     Effort: Pulmonary effort is normal.  Skin:    General: Skin is warm and dry.  Neurological:     Mental Status: She is alert and oriented to person, place, and time.  Psychiatric:        Behavior: Behavior normal.     Ortho Exam awake alert and oriented x3.  Comfortable sitting.  Slight limp referable to her left knee.  Large legs but I do not think she has an  effusion.  Predominately medial joint pain.  Some patellar crepitation but no pain with compression.  Flexed about 95 200 degrees without instability.  No distal edema Specialty Comments:  No specialty comments available.  Imaging: No results found.   PMFS History: Patient Active Problem List   Diagnosis Date Noted  . Bilateral primary osteoarthritis of knee 12/27/2016  . 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 Prediabetes 12/03/2015  . OSA on CPAP 02/19/2015  . Morbid obesity ([place]) 10/09/2014  . Pulmonary HTN ([place]) 05/12/2013  . Bilateral lower extremity edema 05/12/2013  . Morbid obesity with BMI of 50.0-59.9, adult ([place])   . Back pain, hx of prior back surgery 12/09/2011  . Pancreatitis, mild, CT negative 12/08/2011  . Fatty liver 12/08/2011  . Substernal chest pain, suspected secondary to acute pancreatitis 12/06/2011  . Hypertension 12/06/2011  . Hypokalemia 12/06/2011  . Hypothyroidism 12/06/2011  . Anxiety 12/06/2011  . BV (bacterial vaginosis)   . Hyperlipidemia    Past Medical History:  Diagnosis Date  . Anemia when she was young  . Arthritis   . Congestion of nasal sinus   . Constipation    takes Colace every other day  .  DDD (degenerative disc disease), lumbar   . Diabetes mellitus without complication ([place])    per pt had been diagnosed but then "number started going down so my doctor said i didnt have diabetes"  . Dry eyes    uses Systane Eye drops daily as needed  . GERD (gastroesophageal reflux disease) 12/06/2011   takes Nexium daily  . Headache(784.0)    occasionally-d/t congestion  . Heart murmur   . History of bronchitis 6-7 yrs ago  . Hyperlipidemia   . Hypertension    takes Metoprolol and Diovan daily  . Hypothyroidism 12/06/2011   takes Synthroid daily  . Joint pain   . Multiple allergies    takes Zyrtec daily;uses Nasonex daily as needed  . Nocturia   . Numbness    weakness-right hand  . OSA on CPAP   . Pancreatitis    . Peripheral edema    takes Furosemide daily  . Shingles   . Urinary frequency   . Venous insufficiency of leg     Family History  Problem Relation Age of Onset  . Hypertension Mother   . Hypertension Father   . Hypertension Sister   . Kidney disease Sister        [facility] TRANSPLANT  . Thyroid disease Sister   . Thyroid disease Sister   . Hypertension Sister   . Breast cancer Maternal Aunt        >50; passed away from it  . Breast cancer Maternal Aunt        >50    Past Surgical History:  Procedure Laterality Date  . ABDOMINAL HYSTERECTOMY     uterine prolapse  . BACK SURGERY     fusion  . CARDIAC SURGERY    . CHOLECYSTECTOMY    . COLONOSCOPY    . ESOPHAGOGASTRODUODENOSCOPY  5-71yrs ago  . FOOT SURGERY Bilateral   . KNEE ARTHROSCOPY    . LAPAROSCOPIC GASTRIC SLEEVE RESECTION N/A 08/23/2016   Procedure: LAPAROSCOPIC GASTRIC SLEEVE RESECTION WITH UPPER ENDO;  Surgeon: Johnathan Hausen, MD;  Location: [facility];  Service: General;  Laterality: N/A;  . SHOULDER ARTHROSCOPY WITH ROTATOR CUFF REPAIR AND SUBACROMIAL DECOMPRESSION Right 08/23/2013   Procedure: RIGHT SHOULDER ARTHROSCOPY WITH  SUBACROMIAL DECOMPRESSION DISTAL CLAVICLE RESECTION AND  ROTATOR CUFF REPAIR ;  Surgeon: Marin Shutter, MD;  Location: [place];  Service: Orthopedics;  Laterality: Right;  . TUBAL LIGATION     Social History   Occupational History    Employer: [employer]    Comment: [employer]  Tobacco Use  . Smoking status: Former Smoker    Packs/day: 0.75    Years: 40.00    Pack years: 30.00    Types: Cigarettes    Last attempt to quit: 1993    Years since quitting: 27.1  . Smokeless tobacco: Never Used  . Tobacco comment: quit smoking in 1993  Substance and Sexual Activity  . Alcohol use: Yes    Alcohol/week: 0.0 standard drinks    Comment: rarely;; mixed drink   . Drug use: No  . Sexual activity: Not Currently    Partners: Male    Birth control/protection: Surgical     Garald Balding,  MD   Note - This record has been created using [employer].  Chart creation errors have been sought, but may not always  have been located. Such creation errors do not reflect on  the standard of medical care.

## 2018-03-09 NOTE — Telephone Encounter (Signed)
Apply for visc for bilateral knees for Dr.Whitfield patient.

## 2018-03-09 NOTE — Progress Notes (Deleted)
Office Visit Note   Patient: [name]           Date of Birth: 1961/01/10           MRN: [id number] Visit Date: 03/09/2018              Requested by: Velna Hatchet, MD [address] [place], [place] [address] PCP: Velna Hatchet, MD   Assessment & Plan: Visit Diagnoses: No diagnosis found.  Plan: ***  Follow-Up Instructions: No follow-ups on file.   Orders:  No orders of the defined types were placed in this encounter.  No orders of the defined types were placed in this encounter.     Procedures: No procedures performed   Clinical Data: No additional findings.   Subjective: Chief Complaint  Patient presents with  . Left Knee - Follow-up  Patient presents today for bilateral knee arthritis. She had a left knee cortisone injection on 12/19/17. Patient states that her injection has worn off and is hoping to get another injection today. She is taking meloxicam and tylenol as needed.   HPI  Review of Systems   Objective: Vital Signs: There were no vitals taken for this visit.  Physical Exam  Ortho Exam  Specialty Comments:  No specialty comments available.  Imaging: No results found.   PMFS History: Patient Active Problem List   Diagnosis Date Noted  . Bilateral primary osteoarthritis of knee 12/27/2016  . 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 Prediabetes 12/03/2015  . OSA on CPAP 02/19/2015  . Morbid obesity ([place]) 10/09/2014  . Pulmonary HTN ([place]) 05/12/2013  . Bilateral lower extremity edema 05/12/2013  . Morbid obesity with BMI of 50.0-59.9, adult ([place])   . Back pain, hx of prior back surgery 12/09/2011  . Pancreatitis, mild, CT negative 12/08/2011  . Fatty liver 12/08/2011  . Substernal chest pain, suspected secondary to acute pancreatitis 12/06/2011  . Hypertension 12/06/2011  . Hypokalemia 12/06/2011  . Hypothyroidism 12/06/2011  . Anxiety 12/06/2011  . BV (bacterial vaginosis)   . Hyperlipidemia      Past Medical History:  Diagnosis Date  . Anemia when she was young  . Arthritis   . Congestion of nasal sinus   . Constipation    takes Colace every other day  . DDD (degenerative disc disease), lumbar   . Diabetes mellitus without complication ([place])    per pt had been diagnosed but then "number started going down so my doctor said i didnt have diabetes"  . Dry eyes    uses Systane Eye drops daily as needed  . GERD (gastroesophageal reflux disease) 12/06/2011   takes Nexium daily  . Headache(784.0)    occasionally-d/t congestion  . Heart murmur   . History of bronchitis 6-7 yrs ago  . Hyperlipidemia   . Hypertension    takes Metoprolol and Diovan daily  . Hypothyroidism 12/06/2011   takes Synthroid daily  . Joint pain   . Multiple allergies    takes Zyrtec daily;uses Nasonex daily as needed  . Nocturia   . Numbness    weakness-right hand  . OSA on CPAP   . Pancreatitis   . Peripheral edema    takes Furosemide daily  . Shingles   . Urinary frequency   . Venous insufficiency of leg     Family History  Problem Relation Age of Onset  . Hypertension Mother   . Hypertension Father   . Hypertension Sister   . Kidney disease Sister  [employer] TRANSPLANT  . Thyroid disease Sister   . Thyroid disease Sister   . Hypertension Sister   . Breast cancer Maternal Aunt        >50; passed away from it  . Breast cancer Maternal Aunt        >50    Past Surgical History:  Procedure Laterality Date  . ABDOMINAL HYSTERECTOMY     uterine prolapse  . BACK SURGERY     fusion  . CARDIAC SURGERY    . CHOLECYSTECTOMY    . COLONOSCOPY    . ESOPHAGOGASTRODUODENOSCOPY  5-27yrs ago  . FOOT SURGERY Bilateral   . KNEE ARTHROSCOPY    . LAPAROSCOPIC GASTRIC SLEEVE RESECTION N/A 08/23/2016   Procedure: LAPAROSCOPIC GASTRIC SLEEVE RESECTION WITH UPPER ENDO;  Surgeon: Johnathan Hausen, MD;  Location: [facility];  Service: General;  Laterality: N/A;  . SHOULDER ARTHROSCOPY WITH ROTATOR CUFF  REPAIR AND SUBACROMIAL DECOMPRESSION Right 08/23/2013   Procedure: RIGHT SHOULDER ARTHROSCOPY WITH  SUBACROMIAL DECOMPRESSION DISTAL CLAVICLE RESECTION AND  ROTATOR CUFF REPAIR ;  Surgeon: Marin Shutter, MD;  Location: [place];  Service: Orthopedics;  Laterality: Right;  . TUBAL LIGATION     Social History   Occupational History    Employer: [employer]    Comment: [employer]  Tobacco Use  . Smoking status: Former Smoker    Packs/day: 0.75    Years: 40.00    Pack years: 30.00    Types: Cigarettes    Last attempt to quit: 1993    Years since quitting: 27.1  . Smokeless tobacco: Never Used  . Tobacco comment: quit smoking in 1993  Substance and Sexual Activity  . Alcohol use: Yes    Alcohol/week: 0.0 standard drinks    Comment: rarely;; mixed drink   . Drug use: No  . Sexual activity: Not Currently    Partners: Male    Birth control/protection: Surgical

## 2018-03-10 ENCOUNTER — Ambulatory Visit (INDEPENDENT_AMBULATORY_CARE_PROVIDER_SITE_OTHER): Payer: 59 | Admitting: Orthopaedic Surgery

## 2018-03-14 ENCOUNTER — Encounter: Payer: Self-pay | Admitting: Podiatry

## 2018-03-15 NOTE — Telephone Encounter (Signed)
Noted  

## 2018-03-17 ENCOUNTER — Telehealth (INDEPENDENT_AMBULATORY_CARE_PROVIDER_SITE_OTHER): Payer: Self-pay

## 2018-03-17 NOTE — Telephone Encounter (Signed)
Submitted VOB for Euflexxa series, bilateral knee. °

## 2018-03-20 ENCOUNTER — Telehealth (INDEPENDENT_AMBULATORY_CARE_PROVIDER_SITE_OTHER): Payer: Self-pay

## 2018-03-20 NOTE — Telephone Encounter (Signed)
Please schedule patient an appointment with Dr. Durward Fortes for gel injection.  Thank you.  Approved for Euflexxa series, bilateral knee Buy & Bill Covered at 100% of the allowable amount through her insurance. No Co-pay No PA required

## 2018-03-27 ENCOUNTER — Other Ambulatory Visit (INDEPENDENT_AMBULATORY_CARE_PROVIDER_SITE_OTHER): Payer: Self-pay | Admitting: Physical Medicine and Rehabilitation

## 2018-03-28 NOTE — Telephone Encounter (Signed)
Please advise 

## 2018-04-03 ENCOUNTER — Telehealth (INDEPENDENT_AMBULATORY_CARE_PROVIDER_SITE_OTHER): Payer: Self-pay

## 2018-04-03 NOTE — Telephone Encounter (Signed)
Patient called stating that she wanted to know if she could be worked in for an injection. Complains of LBP with radicular buttock and leg pain. Asked for call back to discuss at [phone]

## 2018-04-03 NOTE — Telephone Encounter (Signed)
Worked in for Tuesday 3/10 at 1445.

## 2018-04-03 NOTE — Telephone Encounter (Signed)
Should be able to do Sacroiliac injections without driver etc - just let her know it would be quick injection "in and out"

## 2018-04-03 NOTE — Telephone Encounter (Signed)
Please advise 

## 2018-04-04 ENCOUNTER — Ambulatory Visit (INDEPENDENT_AMBULATORY_CARE_PROVIDER_SITE_OTHER): Payer: Self-pay

## 2018-04-04 ENCOUNTER — Telehealth: Payer: Self-pay | Admitting: *Deleted

## 2018-04-04 ENCOUNTER — Ambulatory Visit (INDEPENDENT_AMBULATORY_CARE_PROVIDER_SITE_OTHER): Payer: 59 | Admitting: Physical Medicine and Rehabilitation

## 2018-04-04 DIAGNOSIS — M76821 Posterior tibial tendinitis, right leg: Secondary | ICD-10-CM

## 2018-04-04 DIAGNOSIS — M461 Sacroiliitis, not elsewhere classified: Secondary | ICD-10-CM

## 2018-04-04 NOTE — Telephone Encounter (Signed)
At this point because there are no tears in the posterior tibial tendon I would consider physical therapy.  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  I will follow-up with her once physical therapy is complete otherwise surgery may be her only other recourse.

## 2018-04-04 NOTE — Telephone Encounter (Signed)
Left message for pt to call to discuss results. 

## 2018-04-04 NOTE — Progress Notes (Signed)
 .  Numeric Pain Rating Scale and Functional Assessment Average Pain 10   In the last MONTH (on 0-10 scale) has pain interfered with the following?  1. General activity like being  able to carry out your everyday physical activities such as walking, climbing stairs, carrying groceries, or moving a chair?  Rating(9)   -Dye Allergies.  

## 2018-04-04 NOTE — Telephone Encounter (Signed)
Pt called states she is calling for results, missed my call.

## 2018-04-04 NOTE — Telephone Encounter (Signed)
I informed pt of Dr. Stephenie Acres review of the over read MRI and orders for PT. Pt agreed to PT with [facility]. Faxed referral, demographics to [facility].

## 2018-04-04 NOTE — Progress Notes (Signed)
[name] - 58 y.o. female MRN [id number]  Date of birth: 23-Nov-1960  Office Visit Note: Visit Date: 04/04/2018 PCP: Velna Hatchet, MD Referred by: Velna Hatchet, MD  Subjective: Chief Complaint  Patient presents with  . Right Thigh - Pain   HPI: [name] is a 58 y.o. female who comes in today For reevaluation and management of worsening right hip and buttock pain.  Patient is well-known to [place] with a history of lumbar fusion and also history of bilateral sacroiliitis.  She has been doing well with intermittent sacroiliac joint injections.  Last injection was in December and she got almost 100% relief.  She reports right-sided symptoms today without groin pain.  No falls.  No numbness or tingling or paresthesia.  Any kind of movement is making the pain worse.  She spine herself having to walk with a cane.  She has good strength on exam.  We will repeat the injection and see how she does over the next 2 weeks.  This would be a right sacroiliac joint injection diagnostically hopefully therapeutically.  ROS Otherwise per HPI.  Assessment & Plan: Visit Diagnoses: No diagnosis found.  Plan: No additional findings.   Meds & Orders: No orders of the defined types were placed in this encounter.  No orders of the defined types were placed in this encounter.   Follow-up: No follow-ups on file.   Procedures: Sacroiliac Joint Inj on 04/04/2018 2:44 PM Indications: pain and diagnostic evaluation Details: 22 G 3.5 in needle, fluoroscopy-guided posterior approach Medications: 2 mL bupivacaine 0.5 %; 80 mg methylPREDNISolone acetate 80 MG/ML Outcome: tolerated well, no immediate complications  There was excellent flow of contrast producing a partial arthrogram of the sacroiliac joint.  Procedure, treatment alternatives, risks and benefits explained, specific risks discussed. Consent was given by the patient. Immediately prior to procedure a time out was called to  verify the correct patient, procedure, equipment, support staff and site/side marked as required. Patient was prepped and draped in the usual sterile fashion.      No notes on file   Clinical History: Lumbar spine MRI dated 06/11/2014 IMPRESSION: Interval fusion L3-4 L4-5. Posterior decompression at these levels without stenosis.  Progressive facet degeneration and mild spinal stenosis at L2-3.  Progressive facet degeneration on the left at L5-S1 with left foraminal encroachment.    She reports that she quit smoking about 27 years ago. Her smoking use included cigarettes. She has a 30.00 pack-year smoking history. She has never used smokeless tobacco. No results for input(s): HGBA1C, LABURIC in the last 8760 hours.  Objective:  VS:  HT:    WT:   BMI:     BP:   HR: bpm  TEMP: ( )  RESP:  Physical Exam  Ortho Exam Imaging: No results found.  Past Medical/Family/Surgical/Social History: Medications & Allergies reviewed per EMR, new medications updated. Patient Active Problem List   Diagnosis Date Noted  . Bilateral primary osteoarthritis of knee 12/27/2016  . 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 Prediabetes 12/03/2015  . OSA on CPAP 02/19/2015  . Morbid obesity ([place]) 10/09/2014  . Pulmonary HTN ([place]) 05/12/2013  . Bilateral lower extremity edema 05/12/2013  . Morbid obesity with BMI of 50.0-59.9, adult ([place])   . Back pain, hx of prior back surgery 12/09/2011  . Pancreatitis, mild, CT negative 12/08/2011  . Fatty liver 12/08/2011  . Substernal chest pain, suspected secondary to acute pancreatitis 12/06/2011  . Hypertension 12/06/2011  . Hypokalemia  12/06/2011  . Hypothyroidism 12/06/2011  . Anxiety 12/06/2011  . BV (bacterial vaginosis)   . Hyperlipidemia    Past Medical History:  Diagnosis Date  . Anemia when she was young  . Arthritis   . Congestion of nasal sinus   . Constipation    takes Colace every other day  . DDD (degenerative  disc disease), lumbar   . Diabetes mellitus without complication ([place])    per pt had been diagnosed but then "number started going down so my doctor said i didnt have diabetes"  . Dry eyes    uses Systane Eye drops daily as needed  . GERD (gastroesophageal reflux disease) 12/06/2011   takes Nexium daily  . Headache(784.0)    occasionally-d/t congestion  . Heart murmur   . History of bronchitis 6-7 yrs ago  . Hyperlipidemia   . Hypertension    takes Metoprolol and Diovan daily  . Hypothyroidism 12/06/2011   takes Synthroid daily  . Joint pain   . Multiple allergies    takes Zyrtec daily;uses Nasonex daily as needed  . Nocturia   . Numbness    weakness-right hand  . OSA on CPAP   . Pancreatitis   . Peripheral edema    takes Furosemide daily  . Shingles   . Urinary frequency   . Venous insufficiency of leg    Family History  Problem Relation Age of Onset  . Hypertension Mother   . Hypertension Father   . Hypertension Sister   . Kidney disease Sister        [facility] TRANSPLANT  . Thyroid disease Sister   . Thyroid disease Sister   . Hypertension Sister   . Breast cancer Maternal Aunt        >50; passed away from it  . Breast cancer Maternal Aunt        >50   Past Surgical History:  Procedure Laterality Date  . ABDOMINAL HYSTERECTOMY     uterine prolapse  . BACK SURGERY     fusion  . CARDIAC SURGERY    . CHOLECYSTECTOMY    . COLONOSCOPY    . ESOPHAGOGASTRODUODENOSCOPY  5-21yrs ago  . FOOT SURGERY Bilateral   . KNEE ARTHROSCOPY    . LAPAROSCOPIC GASTRIC SLEEVE RESECTION N/A 08/23/2016   Procedure: LAPAROSCOPIC GASTRIC SLEEVE RESECTION WITH UPPER ENDO;  Surgeon: Johnathan Hausen, MD;  [facility];  Service: General;  Laterality: N/A;  . SHOULDER ARTHROSCOPY WITH ROTATOR CUFF REPAIR AND SUBACROMIAL DECOMPRESSION Right 08/23/2013   Procedure: RIGHT SHOULDER ARTHROSCOPY WITH  SUBACROMIAL DECOMPRESSION DISTAL CLAVICLE RESECTION AND  ROTATOR CUFF REPAIR ;  Surgeon:  Marin Shutter, MD;  Location: [place];  Service: Orthopedics;  Laterality: Right;  . TUBAL LIGATION     Social History   Occupational History    Employer: [employer]    Comment: [employer]  Tobacco Use  . Smoking status: Former Smoker    Packs/day: 0.75    Years: 40.00    Pack years: 30.00    Types: Cigarettes    Last attempt to quit: 1993    Years since quitting: 27.2  . Smokeless tobacco: Never Used  . Tobacco comment: quit smoking in 1993  Substance and Sexual Activity  . Alcohol use: Yes    Alcohol/week: 0.0 standard drinks    Comment: rarely;; mixed drink   . Drug use: No  . Sexual activity: Not Currently    Partners: Male    Birth control/protection: Surgical

## 2018-04-04 NOTE — Telephone Encounter (Signed)
Pt called to for over read results.

## 2018-04-05 MED ORDER — BUPIVACAINE HCL 0.5 % IJ SOLN
2.0000 mL | INTRAMUSCULAR | Status: AC | PRN
  Administered 2018-04-04: 2 mL via INTRA_ARTICULAR

## 2018-04-05 MED ORDER — METHYLPREDNISOLONE ACETATE 80 MG/ML IJ SUSP
80.0000 mg | INTRAMUSCULAR | Status: AC | PRN
  Administered 2018-04-04: 80 mg via INTRA_ARTICULAR

## 2018-04-06 ENCOUNTER — Encounter: Payer: Self-pay | Admitting: Physical Therapy

## 2018-04-06 ENCOUNTER — Other Ambulatory Visit: Payer: Self-pay

## 2018-04-06 ENCOUNTER — Ambulatory Visit: Payer: 59 | Attending: Podiatry | Admitting: Physical Therapy

## 2018-04-06 DIAGNOSIS — R2689 Other abnormalities of gait and mobility: Secondary | ICD-10-CM | POA: Diagnosis not present

## 2018-04-06 DIAGNOSIS — M25571 Pain in right ankle and joints of right foot: Secondary | ICD-10-CM | POA: Insufficient documentation

## 2018-04-06 DIAGNOSIS — R600 Localized edema: Secondary | ICD-10-CM | POA: Insufficient documentation

## 2018-04-06 DIAGNOSIS — M6281 Muscle weakness (generalized): Secondary | ICD-10-CM | POA: Diagnosis not present

## 2018-04-06 NOTE — Therapy (Signed)
[place], [place], [address] Phone: [phone]   Fax:  [phone]  Physical Therapy Evaluation  Patient Details  Name: Soo Steelman MRN: [id number] Date of Birth: 1960-08-27 Referring Provider (PT): Tyson Dense DPM   Encounter Date: 04/06/2018  PT End of Session - 04/06/18 1016    Visit Number  1    Number of Visits  13    Date for PT Re-Evaluation  05/18/18    Authorization Type  [facility] [employer]    PT Start Time  0803    PT Stop Time  0844    PT Time Calculation (min)  41 min    Activity Tolerance  Patient tolerated treatment well    Behavior During Therapy  [facility] for tasks assessed/performed       Past Medical History:  Diagnosis Date  . Anemia when she was young  . Arthritis   . Congestion of nasal sinus   . Constipation    takes Colace every other day  . DDD (degenerative disc disease), lumbar   . Diabetes mellitus without complication ([place])    per pt had been diagnosed but then "number started going down so my doctor said i didnt have diabetes"  . Dry eyes    uses Systane Eye drops daily as needed  . GERD (gastroesophageal reflux disease) 12/06/2011   takes Nexium daily  . Headache(784.0)    occasionally-d/t congestion  . Heart murmur   . History of bronchitis 6-7 yrs ago  . Hyperlipidemia   . Hypertension    takes Metoprolol and Diovan daily  . Hypothyroidism 12/06/2011   takes Synthroid daily  . Joint pain   . Multiple allergies    takes Zyrtec daily;uses Nasonex daily as needed  . Nocturia   . Numbness    weakness-right hand  . OSA on CPAP   . Pancreatitis   . Peripheral edema    takes Furosemide daily  . Shingles   . Urinary frequency   . Venous insufficiency of leg     Past Surgical History:  Procedure Laterality Date  . ABDOMINAL HYSTERECTOMY     uterine prolapse  . BACK SURGERY     fusion  . CARDIAC SURGERY    . CHOLECYSTECTOMY    . COLONOSCOPY    .  ESOPHAGOGASTRODUODENOSCOPY  5-50yrs ago  . FOOT SURGERY Bilateral   . KNEE ARTHROSCOPY    . LAPAROSCOPIC GASTRIC SLEEVE RESECTION N/A 08/23/2016   Procedure: LAPAROSCOPIC GASTRIC SLEEVE RESECTION WITH UPPER ENDO;  Surgeon: Johnathan Hausen, MD;  Location: [facility];  Service: General;  Laterality: N/A;  . SHOULDER ARTHROSCOPY WITH ROTATOR CUFF REPAIR AND SUBACROMIAL DECOMPRESSION Right 08/23/2013   Procedure: RIGHT SHOULDER ARTHROSCOPY WITH  SUBACROMIAL DECOMPRESSION DISTAL CLAVICLE RESECTION AND  ROTATOR CUFF REPAIR ;  Surgeon: Marin Shutter, MD;  Location: [facility];  Service: Orthopedics;  Laterality: Right;  . TUBAL LIGATION      There were no vitals filed for this visit.   Subjective Assessment - 04/06/18 1005    Subjective  Pt is a 58 y.o F with CC R foot pain that started 2-3 months ago with no specific onset beside having a surgery on the L foot and could be related to overuse of the RLE. She reports having increased R hip pain that could be related. Since onset she reports pain has progressively worsened.     Patient Stated Goals  to calm down pain     Currently in  Pain?  Yes    Pain Score  7    at worst 10/10, last took meds for pain this AM   Pain Location  Ankle    Pain Orientation  Right    Pain Descriptors / Indicators  Burning    Pain Type  Chronic pain    Pain Onset  More than a month ago    Pain Frequency  Intermittent    Aggravating Factors   standing/ walking, steps    Pain Relieving Factors  medication , ice    Multiple Pain Sites  Yes    Pain Score  10    Pain Location  Hip    Pain Orientation  Right    Pain Descriptors / Indicators  Aching;Sore;Shooting    Pain Type  Chronic pain    Pain Onset  More than a month ago    Pain Frequency  Constant    Aggravating Factors   standing/ walking, direct pressure    Pain Relieving Factors  medication, ice         [facility] PT Assessment - 04/06/18 0846      Assessment   Medical Diagnosis  R posterior tibialis tendinitis     Referring Provider (PT)  Max Hyatt DPM    Onset Date/Surgical Date  --   2-3 months ago   Hand Dominance  Right    Next MD Visit  4-5 weeks    Prior Therapy  yes      Precautions   Precautions  None      Restrictions   Weight Bearing Restrictions  No      Balance Screen   Has the patient fallen in the past 6 months  No      [place] residence    Living Arrangements  Spouse/significant other    Available Help at Discharge  Family    Type of [place] to enter    Entrance Stairs-Number of Steps  3    Entrance Stairs-Rails  Can reach both    [place]  One level    [place] - single point;Walker - 2 wheels      Prior Function   Level of Independence  Independent with basic ADLs;Needs assistance with ADLs;Needs assistance with gait    Vocation  Full time employment    Vocation Requirements  standing/ walking      Cognition   Overall Cognitive Status  Within Functional Limits for tasks assessed      Observation/Other Assessments   Focus on Therapeutic Outcomes (FOTO)   74% limited   Precited 50% limited     Observation/Other Assessments-Edema    Edema  Figure 8      Figure 8 Edema   Figure 8 - Right   53.1    Figure 8 - Left   52      Posture/Postural Control   Posture/Postural Control  Postural limitations    Postural Limitations  Rounded Shoulders;Forward head      ROM / Strength   AROM / PROM / Strength  AROM;PROM;Strength      AROM   AROM Assessment Site  Ankle    Right/Left Ankle  Right;Left    Right Ankle Dorsiflexion  1    Right Ankle Plantar Flexion  55    Right Ankle Inversion  20    Right Ankle Eversion  20    Left Ankle Dorsiflexion  3    Left Ankle Plantar Flexion  40    Left Ankle Inversion  20    Left Ankle Eversion  18      PROM   PROM Assessment Site  Ankle    Right/Left Ankle  Right    Right Ankle Dorsiflexion  8    Right Ankle Plantar Flexion  60       Strength   Strength Assessment Site  Ankle    Right/Left Ankle  Right;Left    Right Ankle Dorsiflexion  4/5    Right Ankle Plantar Flexion  4-/5    Right Ankle Inversion  4-/5    Right Ankle Eversion  4-/5    Left Ankle Dorsiflexion  4+/5    Left Ankle Plantar Flexion  4+/5    Left Ankle Inversion  4+/5    Left Ankle Eversion  4+/5      Palpation   Palpation comment  TTP along the navicular tuberosity and distal posterior tib      Ambulation/Gait   Ambulation/Gait  Yes    Assistive device  Straight cane    Gait Pattern  Step-through pattern;Decreased stride length;Trendelenburg;Antalgic;Decreased stance time - right;Decreased step length - left                Objective measurements completed on examination: See above findings.      [facility] Adult PT Treatment/Exercise - 04/06/18 0846      Manual Therapy   Manual therapy comments  MTPR along the posterior tibialis      Ankle Exercises: Stretches   Gastroc Stretch  2 reps;30 seconds             PT Education - 04/06/18 1015    Education Details  evaluation findings, POC, goals, HEP with proper form/ rationale. proper gait mechanics focusing on heel strike/ toe off taking smaller steps    Person(s) Educated  Patient    Methods  Explanation;Demonstration;Verbal cues;Handout    Comprehension  Verbalized understanding;Returned demonstration;Verbal cues required       PT Short Term Goals - 04/06/18 1112      PT SHORT TERM GOAL #1   Title  Pt to be I with initial HEP    Time  3    Period  Weeks    Status  New    Target Date  04/27/18      PT SHORT TERM GOAL #2   Title  Pt to verbalize/ demo proper posture and gait mechanics to prevent and reduce foot/ and hip pain    Time  3    Period  Weeks    Status  New    Target Date  04/27/18        PT Long Term Goals - 04/06/18 1129      PT LONG TERM GOAL #1   Title  Pt to increase L ankle strength to >/= 4+/5 in all planes to promote ankle stability      Time  6    Period  Weeks    Status  New    Target Date  05/18/18      PT LONG TERM GOAL #2   Title  Pt to be able to walk / stand >/= 60 min and navigate up/down 12 steps reciprocally with </=3/10 pain     Time  6    Period  Weeks    Status  New    Target Date  05/18/18      PT LONG TERM GOAL #3  Title  Increase FOTO limitation to </= 50% limited to demo improvement in function.     Time  6    Period  Weeks    Status  New    Target Date  05/18/18      PT LONG TERM GOAL #4   Title  Pt to be I with all HEP given as of last visit to maintain and progress current level of function.     Time  6    Period  Weeks    Status  New    Target Date  05/18/18             Plan - 04/06/18 1017    Clinical Impression Statement  pt presents to OPPT with CC of R ankle pain 2-3 months ago with no traumatic injury but likely due to overuse from L ankle surgery. She demonstrates functional ROM except for DF and noted concordant pain with all movements. Weakness in the L ankle secondary to pain. She would benefit from physical therapy to decrease R ankle pain, improve ankle ROM and strength, promote efficent gait and maimixze overall function by addressing the deficits listed.     Personal Factors and Comorbidities  Comorbidity 2;Age    Comorbidities  PMHx L ankle surgery. DM    Examination-Activity Limitations  Stand;Stairs    Stability/Clinical Decision Making  Evolving/Moderate complexity    Clinical Decision Making  Moderate    Rehab Potential  Good    PT Frequency  2x / week    PT Duration  6 weeks    PT Treatment/Interventions  ADLs/Self Care Home Management;Cryotherapy;Electrical Stimulation;Iontophoresis 4mg /ml Dexamethasone;Moist Heat;Ultrasound;Therapeutic exercise;Taping;Vasopneumatic Device;Manual techniques;Dry needling;Patient/family education;Therapeutic activities;Passive range of motion    PT Next Visit Plan  review/ update HEP, posterior tib STW, foot intrinsics, assess hip  abductor strengthening, vaso for pain/ swelling PRN, gait training    PT Home Exercise Plan  4-way ankle strength, towel scrunch, calf stretch (seated and standing )    Consulted and Agree with Plan of Care  Patient       Patient will benefit from skilled therapeutic intervention in order to improve the following deficits and impairments:  Pain, Decreased strength, Decreased endurance, Decreased activity tolerance, Postural dysfunction, Improper body mechanics, Decreased range of motion, Abnormal gait, Increased edema, Increased fascial restricitons  Visit Diagnosis: Pain in right ankle and joints of right foot  Other abnormalities of gait and mobility  Localized edema  Muscle weakness (generalized)     Problem List Patient Active Problem List   Diagnosis Date Noted  . Bilateral primary osteoarthritis of knee 12/27/2016  . 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 Prediabetes 12/03/2015  . OSA on CPAP 02/19/2015  . Morbid obesity ([place]) 10/09/2014  . Pulmonary HTN ([place]) 05/12/2013  . Bilateral lower extremity edema 05/12/2013  . Morbid obesity with BMI of 50.0-59.9, adult ([place])   . Back pain, hx of prior back surgery 12/09/2011  . Pancreatitis, mild, CT negative 12/08/2011  . Fatty liver 12/08/2011  . Substernal chest pain, suspected secondary to acute pancreatitis 12/06/2011  . Hypertension 12/06/2011  . Hypokalemia 12/06/2011  . Hypothyroidism 12/06/2011  . Anxiety 12/06/2011  . BV (bacterial vaginosis)   . Hyperlipidemia    Starr Lake PT, DPT, LAT, ATC  04/06/18  11:50 AM      [facility] [address] [place], [place], [address] Phone: [phone]   Fax:  [phone]  Name: Jinan Biggins MRN: [id number] Date  of Birth: [birth year]

## 2018-04-11 ENCOUNTER — Other Ambulatory Visit: Payer: Self-pay

## 2018-04-11 ENCOUNTER — Encounter (INDEPENDENT_AMBULATORY_CARE_PROVIDER_SITE_OTHER): Payer: Self-pay | Admitting: Orthopaedic Surgery

## 2018-04-11 ENCOUNTER — Ambulatory Visit (INDEPENDENT_AMBULATORY_CARE_PROVIDER_SITE_OTHER): Payer: 59 | Admitting: Orthopaedic Surgery

## 2018-04-11 VITALS — BP 116/77 | HR 62 | Ht 64.0 in | Wt 262.0 lb

## 2018-04-11 DIAGNOSIS — M170 Bilateral primary osteoarthritis of knee: Secondary | ICD-10-CM

## 2018-04-11 MED ORDER — SODIUM HYALURONATE (VISCOSUP) 20 MG/2ML IX SOSY
20.0000 mg | PREFILLED_SYRINGE | INTRA_ARTICULAR | Status: AC | PRN
  Administered 2018-04-11: 20 mg via INTRA_ARTICULAR

## 2018-04-11 NOTE — Progress Notes (Signed)
Office Visit Note   Patient: [name]           Date of Birth: [birth year]           MRN: [id number] Visit Date: 04/11/2018              Requested by: Velna Hatchet, MD [address] [place], [place] [address] PCP: Velna Hatchet, MD   Assessment & Plan: Visit Diagnoses:  1. Bilateral primary osteoarthritis of knee     Plan: First Euflexxa injection both knees.  Follow-up weekly for the next 2 weeks to complete the series  Follow-Up Instructions: Return in about 1 week (around 04/18/2018).   Orders:  Orders Placed This Encounter  Procedures  . Large Joint Inj: bilateral knee   No orders of the defined types were placed in this encounter.     Procedures: Large Joint Inj: bilateral knee on 04/11/2018 1:52 PM Indications: pain and joint swelling Details: 25 G 1.5 in needle, anteromedial approach  Arthrogram: No  Medications (Right): 20 mg Sodium Hyaluronate 20 MG/2ML Medications (Left): 20 mg Sodium Hyaluronate 20 MG/2ML Outcome: tolerated well, no immediate complications Procedure, treatment alternatives, risks and benefits explained, specific risks discussed. Consent was given by the patient. Immediately prior to procedure a time out was called to verify the correct patient, procedure, equipment, support staff and site/side marked as required. Patient was prepped and draped in the usual sterile fashion.       Clinical Data: No additional findings.   Subjective: Chief Complaint  Patient presents with  . Left Knee - Pain  . Right Knee - Pain  Patient presents today for the first bilateral Euflexxa injections. She is currently taking Meloxicam and tylenol for pain.  Has significant osteoarthritis of both knees with the prior Euflexxa injections resulting in excellent pain relief  HPI  Review of Systems   Objective: Vital Signs: BP 116/77   Pulse 62   Ht 5\' 4"  (1.626 m)   Wt 262 lb (118.8 kg)   BMI 44.97 kg/m   Physical Exam  Ortho  Exam large knees.  Obvious effusion.  Full extension and flexed about 90 degrees when calf touches thigh.  No instability.  Medial joint pain bilaterally and some patellar crepitation.  Knees were not hot, warm or red  Specialty Comments:  No specialty comments available.  Imaging: No results found.   PMFS History: Patient Active Problem List   Diagnosis Date Noted  . Bilateral primary osteoarthritis of knee 12/27/2016  . 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 Prediabetes 12/03/2015  . OSA on CPAP 02/19/2015  . Morbid obesity ([place]) 10/09/2014  . Pulmonary HTN ([place]) 05/12/2013  . Bilateral lower extremity edema 05/12/2013  . Morbid obesity with BMI of 50.0-59.9, adult ([place])   . Back pain, hx of prior back surgery 12/09/2011  . Pancreatitis, mild, CT negative 12/08/2011  . Fatty liver 12/08/2011  . Substernal chest pain, suspected secondary to acute pancreatitis 12/06/2011  . Hypertension 12/06/2011  . Hypokalemia 12/06/2011  . Hypothyroidism 12/06/2011  . Anxiety 12/06/2011  . BV (bacterial vaginosis)   . Hyperlipidemia    Past Medical History:  Diagnosis Date  . Anemia when she was young  . Arthritis   . Congestion of nasal sinus   . Constipation    takes Colace every other day  . DDD (degenerative disc disease), lumbar   . Diabetes mellitus without complication ([place])    per pt had been diagnosed but then "number started  going down so my doctor said i didnt have diabetes"  . Dry eyes    uses Systane Eye drops daily as needed  . GERD (gastroesophageal reflux disease) 12/06/2011   takes Nexium daily  . Headache(784.0)    occasionally-d/t congestion  . Heart murmur   . History of bronchitis 6-7 yrs ago  . Hyperlipidemia   . Hypertension    takes Metoprolol and Diovan daily  . Hypothyroidism 12/06/2011   takes Synthroid daily  . Joint pain   . Multiple allergies    takes Zyrtec daily;uses Nasonex daily as needed  . Nocturia   . Numbness     weakness-right hand  . OSA on CPAP   . Pancreatitis   . Peripheral edema    takes Furosemide daily  . Shingles   . Urinary frequency   . Venous insufficiency of leg     Family History  Problem Relation Age of Onset  . Hypertension Mother   . Hypertension Father   . Hypertension Sister   . Kidney disease Sister        [facility] TRANSPLANT  . Thyroid disease Sister   . Thyroid disease Sister   . Hypertension Sister   . Breast cancer Maternal Aunt        >50; passed away from it  . Breast cancer Maternal Aunt        >50    Past Surgical History:  Procedure Laterality Date  . ABDOMINAL HYSTERECTOMY     uterine prolapse  . BACK SURGERY     fusion  . CARDIAC SURGERY    . CHOLECYSTECTOMY    . COLONOSCOPY    . ESOPHAGOGASTRODUODENOSCOPY  5-41yrs ago  . FOOT SURGERY Bilateral   . KNEE ARTHROSCOPY    . LAPAROSCOPIC GASTRIC SLEEVE RESECTION N/A 08/23/2016   Procedure: LAPAROSCOPIC GASTRIC SLEEVE RESECTION WITH UPPER ENDO;  Surgeon: Johnathan Hausen, MD;  [facility];  Service: General;  Laterality: N/A;  . SHOULDER ARTHROSCOPY WITH ROTATOR CUFF REPAIR AND SUBACROMIAL DECOMPRESSION Right 08/23/2013   Procedure: RIGHT SHOULDER ARTHROSCOPY WITH  SUBACROMIAL DECOMPRESSION DISTAL CLAVICLE RESECTION AND  ROTATOR CUFF REPAIR ;  Surgeon: Marin Shutter, MD;  Location: [facility];  Service: Orthopedics;  Laterality: Right;  . TUBAL LIGATION     Social History   Occupational History    Employer: [employer]    Comment: [employer]  Tobacco Use  . Smoking status: Former Smoker    Packs/day: 0.75    Years: 40.00    Pack years: 30.00    Types: Cigarettes    Last attempt to quit: 1993    Years since quitting: 27.2  . Smokeless tobacco: Never Used  . Tobacco comment: quit smoking in 1993  Substance and Sexual Activity  . Alcohol use: Yes    Alcohol/week: 0.0 standard drinks    Comment: rarely;; mixed drink   . Drug use: No  . Sexual activity: Not Currently    Partners: Male    Birth  control/protection: Surgical

## 2018-04-12 ENCOUNTER — Other Ambulatory Visit: Payer: Self-pay

## 2018-04-12 ENCOUNTER — Encounter: Payer: Self-pay | Admitting: Physical Therapy

## 2018-04-12 ENCOUNTER — Ambulatory Visit: Payer: 59 | Admitting: Physical Therapy

## 2018-04-12 DIAGNOSIS — M25571 Pain in right ankle and joints of right foot: Secondary | ICD-10-CM

## 2018-04-12 DIAGNOSIS — M6281 Muscle weakness (generalized): Secondary | ICD-10-CM

## 2018-04-12 DIAGNOSIS — R2689 Other abnormalities of gait and mobility: Secondary | ICD-10-CM | POA: Diagnosis not present

## 2018-04-12 DIAGNOSIS — R600 Localized edema: Secondary | ICD-10-CM | POA: Diagnosis not present

## 2018-04-12 NOTE — Therapy (Signed)
[place], [place], [address] Phone: [phone]   Fax:  [phone]  Physical Therapy Treatment  Patient Details  Name: [name] MRN: [id number] Date of Birth: [birth year] Referring Provider (PT): Tyson Dense DPM   Encounter Date: 04/12/2018  PT End of Session - 04/12/18 1631    Visit Number  2    Number of Visits  13    Date for PT Re-Evaluation  05/18/18    Authorization Type  [facility] [employer]    PT Start Time  2951    PT Stop Time  1629    PT Time Calculation (min)  44 min    Activity Tolerance  Patient tolerated treatment well    Behavior During Therapy  [facility] for tasks assessed/performed       Past Medical History:  Diagnosis Date  . Anemia when she was young  . Arthritis   . Congestion of nasal sinus   . Constipation    takes Colace every other day  . DDD (degenerative disc disease), lumbar   . Diabetes mellitus without complication ([place])    per pt had been diagnosed but then "number started going down so my doctor said i didnt have diabetes"  . Dry eyes    uses Systane Eye drops daily as needed  . GERD (gastroesophageal reflux disease) 12/06/2011   takes Nexium daily  . Headache(784.0)    occasionally-d/t congestion  . Heart murmur   . History of bronchitis 6-7 yrs ago  . Hyperlipidemia   . Hypertension    takes Metoprolol and Diovan daily  . Hypothyroidism 12/06/2011   takes Synthroid daily  . Joint pain   . Multiple allergies    takes Zyrtec daily;uses Nasonex daily as needed  . Nocturia   . Numbness    weakness-right hand  . OSA on CPAP   . Pancreatitis   . Peripheral edema    takes Furosemide daily  . Shingles   . Urinary frequency   . Venous insufficiency of leg     Past Surgical History:  Procedure Laterality Date  . ABDOMINAL HYSTERECTOMY     uterine prolapse  . BACK SURGERY     fusion  . CARDIAC SURGERY    . CHOLECYSTECTOMY    . COLONOSCOPY    .  ESOPHAGOGASTRODUODENOSCOPY  5-26yrs ago  . FOOT SURGERY Bilateral   . KNEE ARTHROSCOPY    . LAPAROSCOPIC GASTRIC SLEEVE RESECTION N/A 08/23/2016   Procedure: LAPAROSCOPIC GASTRIC SLEEVE RESECTION WITH UPPER ENDO;  Surgeon: Johnathan Hausen, MD;  Location: [facility];  Service: General;  Laterality: N/A;  . SHOULDER ARTHROSCOPY WITH ROTATOR CUFF REPAIR AND SUBACROMIAL DECOMPRESSION Right 08/23/2013   Procedure: RIGHT SHOULDER ARTHROSCOPY WITH  SUBACROMIAL DECOMPRESSION DISTAL CLAVICLE RESECTION AND  ROTATOR CUFF REPAIR ;  Surgeon: Marin Shutter, MD;  Location: [facility];  Service: Orthopedics;  Laterality: Right;  . TUBAL LIGATION      There were no vitals filed for this visit.  Subjective Assessment - 04/12/18 1552    Subjective  "I am doing my exercises. and they are helping especially the walking is helping"     Currently in Pain?  Yes    Pain Score  2     Pain Orientation  Right    Pain Type  Chronic pain    Pain Onset  More than a month ago    Pain Frequency  Intermittent  [place] Adult PT Treatment/Exercise - 04/12/18 1554      Neuro Re-ed    Neuro Re-ed Details   Toe Yoga Alternating Great Toe and Lesser Toe Extension      Manual Therapy   Manual Therapy  Joint mobilization;Taping    Manual therapy comments  MTPR along posterior Tib Right; Lo Di Arch Support Tape Right Navicular     Joint Mobilization  Talocrural Distraction       Ankle Exercises: Stretches   Gastroc Stretch  2 reps;30 seconds      Ankle Exercises: Seated   [place] Pickup  x 2 RLE only    BAPS  Level 2;Sitting   DF/PF and Inv/Ev, CW/CCW   Other Seated Ankle Exercises  toe yoga 2 x 10 holding 5 sec   tactile cues to promote inhibition     Ankle Exercises: Standing   Other Standing Ankle Exercises  gait training with low dye taping on the arch 2 x 10             PT Education - 04/12/18 1633    Education Details  updated HEP for toe yoga and reviewed previous HEP     Person(s) Educated  Patient    Methods  Explanation;Verbal cues;Handout    Comprehension  Verbalized understanding;Verbal cues required       PT Short Term Goals - 04/06/18 1112      PT SHORT TERM GOAL #1   Title  Pt to be I with initial HEP    Time  3    Period  Weeks    Status  New    Target Date  04/27/18      PT SHORT TERM GOAL #2   Title  Pt to verbalize/ demo proper posture and gait mechanics to prevent and reduce foot/ and hip pain    Time  3    Period  Weeks    Status  New    Target Date  04/27/18        PT Long Term Goals - 04/06/18 1129      PT LONG TERM GOAL #1   Title  Pt to increase L ankle strength to >/= 4+/5 in all planes to promote ankle stability     Time  6    Period  Weeks    Status  New    Target Date  05/18/18      PT LONG TERM GOAL #2   Title  Pt to be able to walk / stand >/= 60 min and navigate up/down 12 steps reciprocally with </=3/10 pain     Time  6    Period  Weeks    Status  New    Target Date  05/18/18      PT LONG TERM GOAL #3   Title  Increase FOTO limitation to </= 50% limited to demo improvement in function.     Time  6    Period  Weeks    Status  New    Target Date  05/18/18      PT LONG TERM GOAL #4   Title  Pt to be I with all HEP given as of last visit to maintain and progress current level of function.     Time  6    Period  Weeks    Status  New    Target Date  05/18/18            Plan - 04/12/18 1631    Clinical  Impression Statement  pt reports consistency with her HEP and notes significant improvement since evaluation. worked on Visual merchandiser to promote intrinsic and Furniture conservator/restorer. trialed arch taping to help support the arch. End of session she reported decreased pain with standing/ walking.     PT Treatment/Interventions  ADLs/Self Care Home Management;Cryotherapy;Electrical Stimulation;Iontophoresis 4mg /ml Dexamethasone;Moist Heat;Ultrasound;Therapeutic exercise;Taping;Vasopneumatic Device;Manual  techniques;Dry needling;Patient/family education;Therapeutic activities;Passive range of motion    PT Next Visit Plan  review/ update HEP, posterior tib STW, foot intrinsics, assess hip abductor strengthening, vaso for pain/ swelling PRN, gait training    PT Home Exercise Plan  4-way ankle strength, towel scrunch, calf stretch (seated and standing ), toe yoga    Consulted and Agree with Plan of Care  Patient       Patient will benefit from skilled therapeutic intervention in order to improve the following deficits and impairments:  Pain, Decreased strength, Decreased endurance, Decreased activity tolerance, Postural dysfunction, Improper body mechanics, Decreased range of motion, Abnormal gait, Increased edema, Increased fascial restricitons  Visit Diagnosis: Pain in right ankle and joints of right foot  Other abnormalities of gait and mobility  Localized edema  Muscle weakness (generalized)     Problem List Patient Active Problem List   Diagnosis Date Noted  . Bilateral primary osteoarthritis of knee 12/27/2016  . 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 Prediabetes 12/03/2015  . OSA on CPAP 02/19/2015  . Morbid obesity ([place]) 10/09/2014  . Pulmonary HTN ([place]) 05/12/2013  . Bilateral lower extremity edema 05/12/2013  . Morbid obesity with BMI of 50.0-59.9, adult ([place])   . Back pain, hx of prior back surgery 12/09/2011  . Pancreatitis, mild, CT negative 12/08/2011  . Fatty liver 12/08/2011  . Substernal chest pain, suspected secondary to acute pancreatitis 12/06/2011  . Hypertension 12/06/2011  . Hypokalemia 12/06/2011  . Hypothyroidism 12/06/2011  . Anxiety 12/06/2011  . BV (bacterial vaginosis)   . Hyperlipidemia    Starr Lake PT, DPT, LAT, ATC  04/12/18  4:34 PM      [facility] [address] [place], [place], [address] Phone: [phone]   Fax:  [phone]  Name: [name] MRN: [id number] Date of Birth: [birth year]

## 2018-04-18 ENCOUNTER — Encounter (INDEPENDENT_AMBULATORY_CARE_PROVIDER_SITE_OTHER): Payer: Self-pay | Admitting: Orthopaedic Surgery

## 2018-04-18 ENCOUNTER — Other Ambulatory Visit: Payer: Self-pay

## 2018-04-18 ENCOUNTER — Ambulatory Visit (INDEPENDENT_AMBULATORY_CARE_PROVIDER_SITE_OTHER): Payer: 59 | Admitting: Orthopaedic Surgery

## 2018-04-18 VITALS — BP 123/72 | HR 57 | Ht 63.5 in | Wt 260.0 lb

## 2018-04-18 DIAGNOSIS — M170 Bilateral primary osteoarthritis of knee: Secondary | ICD-10-CM | POA: Diagnosis not present

## 2018-04-18 MED ORDER — SODIUM HYALURONATE (VISCOSUP) 20 MG/2ML IX SOSY
20.0000 mg | PREFILLED_SYRINGE | INTRA_ARTICULAR | Status: AC | PRN
  Administered 2018-04-18: 20 mg via INTRA_ARTICULAR

## 2018-04-18 NOTE — Progress Notes (Signed)
Office Visit Note   Patient: [name]           Date of Birth: January 25, 1961           MRN: [id number] Visit Date: 04/18/2018              Requested by: Velna Hatchet, MD [address] [place], [place] [address] PCP: Velna Hatchet, MD   Assessment & Plan: Visit Diagnoses:  1. Bilateral primary osteoarthritis of knee     Plan: Second Euflexxa injection both knees.  Return in 1 week to complete the series.  Follow-Up Instructions: Return in about 1 week (around 04/25/2018).   Orders:  Orders Placed This Encounter  Procedures  . Large Joint Inj: bilateral knee   No orders of the defined types were placed in this encounter.     Procedures: Large Joint Inj: bilateral knee on 04/18/2018 8:42 AM Indications: pain and joint swelling Details: 25 G 1.5 in needle, anteromedial approach  Arthrogram: No  Medications (Right): 20 mg Sodium Hyaluronate 20 MG/2ML Medications (Left): 20 mg Sodium Hyaluronate 20 MG/2ML Outcome: tolerated well, no immediate complications Procedure, treatment alternatives, risks and benefits explained, specific risks discussed. Consent was given by the patient. Immediately prior to procedure a time out was called to verify the correct patient, procedure, equipment, support staff and site/side marked as required. Patient was prepped and draped in the usual sterile fashion.       Clinical Data: No additional findings.   Subjective: Chief Complaint  Patient presents with  . Left Knee - Follow-up    Started euflexxa 04/11/18  . Right Knee - Follow-up    Started euflexxa 04/11/18  Patient presents today for the second injection of Euflexxa bilaterally. Patient states that she has already noticed a lot of improvement since the the first injections.   HPI  Review of Systems   Objective: Vital Signs: BP 123/72   Pulse (!) 57   Ht 5' 3.5" (1.613 m)   Wt 260 lb (117.9 kg)   BMI 45.33 kg/m   Physical Exam  Ortho Exam neither knee  was hot red warm or swollen.  No significant medial lateral joint pain.  Full extension.  Specialty Comments:  No specialty comments available.  Imaging: No results found.   PMFS History: Patient Active Problem List   Diagnosis Date Noted  . Bilateral primary osteoarthritis of knee 12/27/2016  . 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 Prediabetes 12/03/2015  . OSA on CPAP 02/19/2015  . Morbid obesity ([place]) 10/09/2014  . Pulmonary HTN ([place]) 05/12/2013  . Bilateral lower extremity edema 05/12/2013  . Morbid obesity with BMI of 50.0-59.9, adult ([place])   . Back pain, hx of prior back surgery 12/09/2011  . Pancreatitis, mild, CT negative 12/08/2011  . Fatty liver 12/08/2011  . Substernal chest pain, suspected secondary to acute pancreatitis 12/06/2011  . Hypertension 12/06/2011  . Hypokalemia 12/06/2011  . Hypothyroidism 12/06/2011  . Anxiety 12/06/2011  . BV (bacterial vaginosis)   . Hyperlipidemia    Past Medical History:  Diagnosis Date  . Anemia when she was young  . Arthritis   . Congestion of nasal sinus   . Constipation    takes Colace every other day  . DDD (degenerative disc disease), lumbar   . Diabetes mellitus without complication ([place])    per pt had been diagnosed but then "number started going down so my doctor said i didnt have diabetes"  . Dry eyes  uses Systane Eye drops daily as needed  . GERD (gastroesophageal reflux disease) 12/06/2011   takes Nexium daily  . Headache(784.0)    occasionally-d/t congestion  . Heart murmur   . History of bronchitis 6-7 yrs ago  . Hyperlipidemia   . Hypertension    takes Metoprolol and Diovan daily  . Hypothyroidism 12/06/2011   takes Synthroid daily  . Joint pain   . Multiple allergies    takes Zyrtec daily;uses Nasonex daily as needed  . Nocturia   . Numbness    weakness-right hand  . OSA on CPAP   . Pancreatitis   . Peripheral edema    takes Furosemide daily  . Shingles   . Urinary  frequency   . Venous insufficiency of leg     Family History  Problem Relation Age of Onset  . Hypertension Mother   . Hypertension Father   . Hypertension Sister   . Kidney disease Sister        [facility] TRANSPLANT  . Thyroid disease Sister   . Thyroid disease Sister   . Hypertension Sister   . Breast cancer Maternal Aunt        >50; passed away from it  . Breast cancer Maternal Aunt        >50    Past Surgical History:  Procedure Laterality Date  . ABDOMINAL HYSTERECTOMY     uterine prolapse  . BACK SURGERY     fusion  . CARDIAC SURGERY    . CHOLECYSTECTOMY    . COLONOSCOPY    . ESOPHAGOGASTRODUODENOSCOPY  5-8yrs ago  . FOOT SURGERY Bilateral   . KNEE ARTHROSCOPY    . LAPAROSCOPIC GASTRIC SLEEVE RESECTION N/A 08/23/2016   Procedure: LAPAROSCOPIC GASTRIC SLEEVE RESECTION WITH UPPER ENDO;  Surgeon: Johnathan Hausen, MD;  [facility];  Service: General;  Laterality: N/A;  . SHOULDER ARTHROSCOPY WITH ROTATOR CUFF REPAIR AND SUBACROMIAL DECOMPRESSION Right 08/23/2013   Procedure: RIGHT SHOULDER ARTHROSCOPY WITH  SUBACROMIAL DECOMPRESSION DISTAL CLAVICLE RESECTION AND  ROTATOR CUFF REPAIR ;  Surgeon: Marin Shutter, MD;  Location: [facility];  Service: Orthopedics;  Laterality: Right;  . TUBAL LIGATION     Social History   Occupational History    Employer: [employer]    Comment: [employer]  Tobacco Use  . Smoking status: Former Smoker    Packs/day: 0.75    Years: 40.00    Pack years: 30.00    Types: Cigarettes    Last attempt to quit: 1993    Years since quitting: 27.2  . Smokeless tobacco: Never Used  . Tobacco comment: quit smoking in 1993  Substance and Sexual Activity  . Alcohol use: Yes    Alcohol/week: 0.0 standard drinks    Comment: rarely;; mixed drink   . Drug use: No  . Sexual activity: Not Currently    Partners: Male    Birth control/protection: Surgical

## 2018-04-19 ENCOUNTER — Ambulatory Visit: Payer: 59 | Admitting: Physical Therapy

## 2018-04-20 ENCOUNTER — Other Ambulatory Visit: Payer: Self-pay | Admitting: Cardiovascular Disease

## 2018-04-20 DIAGNOSIS — G4733 Obstructive sleep apnea (adult) (pediatric): Secondary | ICD-10-CM | POA: Diagnosis not present

## 2018-04-21 ENCOUNTER — Telehealth (INDEPENDENT_AMBULATORY_CARE_PROVIDER_SITE_OTHER): Payer: Self-pay | Admitting: *Deleted

## 2018-04-21 ENCOUNTER — Ambulatory Visit: Payer: 59 | Admitting: Physical Therapy

## 2018-04-24 ENCOUNTER — Other Ambulatory Visit (INDEPENDENT_AMBULATORY_CARE_PROVIDER_SITE_OTHER): Payer: Self-pay | Admitting: Physical Medicine and Rehabilitation

## 2018-04-24 MED ORDER — GABAPENTIN 300 MG PO CAPS: 300.0000 mg | ORAL_CAPSULE | Freq: Two times a day (BID) | ORAL | 1 refills | Status: DC

## 2018-04-24 NOTE — Telephone Encounter (Signed)
Called pt and advised.  

## 2018-04-24 NOTE — Telephone Encounter (Signed)
Did BID dosing for 180 caps which should be 90 days, and did 1 refill

## 2018-04-25 ENCOUNTER — Ambulatory Visit (INDEPENDENT_AMBULATORY_CARE_PROVIDER_SITE_OTHER): Payer: 59 | Admitting: Orthopaedic Surgery

## 2018-04-26 ENCOUNTER — Ambulatory Visit: Payer: 59 | Admitting: Physical Therapy

## 2018-04-27 ENCOUNTER — Telehealth: Payer: Self-pay | Admitting: Physical Therapy

## 2018-04-27 NOTE — Telephone Encounter (Signed)
[name] was contacted today regarding the temporary reduction of OP Rehab Services due to concerns for community transmission of Covid-19.  A voicemail was left detailed the need to cancel all of her upcoming appointments as a result.  Therapist advised the patient to continue to perform their HEP and assured that if they had any questions regarding her POC that she is to contact the main office phone at [facility].

## 2018-04-28 ENCOUNTER — Ambulatory Visit: Payer: 59 | Admitting: Physical Therapy

## 2018-04-28 ENCOUNTER — Ambulatory Visit (INDEPENDENT_AMBULATORY_CARE_PROVIDER_SITE_OTHER): Payer: 59 | Admitting: Orthopaedic Surgery

## 2018-04-28 ENCOUNTER — Other Ambulatory Visit: Payer: Self-pay

## 2018-04-28 ENCOUNTER — Encounter (INDEPENDENT_AMBULATORY_CARE_PROVIDER_SITE_OTHER): Payer: Self-pay | Admitting: Orthopaedic Surgery

## 2018-04-28 VITALS — BP 131/79 | HR 61 | Ht 63.0 in | Wt 260.0 lb

## 2018-04-28 DIAGNOSIS — M1712 Unilateral primary osteoarthritis, left knee: Secondary | ICD-10-CM

## 2018-04-28 DIAGNOSIS — M1711 Unilateral primary osteoarthritis, right knee: Secondary | ICD-10-CM

## 2018-04-28 MED ORDER — SODIUM HYALURONATE (VISCOSUP) 20 MG/2ML IX SOSY
20.0000 mg | PREFILLED_SYRINGE | INTRA_ARTICULAR | Status: AC | PRN
  Administered 2018-04-28: 20 mg via INTRA_ARTICULAR

## 2018-04-28 MED ORDER — LIDOCAINE HCL 1 % IJ SOLN
2.0000 mL | INTRAMUSCULAR | Status: AC | PRN
  Administered 2018-04-28: 2 mL

## 2018-04-28 NOTE — Progress Notes (Signed)
Office Visit Note   Patient: [name]           Date of Birth: 07-Nov-1960           MRN: [id number] Visit Date: 04/28/2018              Requested by: Velna Hatchet, MD [address] [place], [place] [address] PCP: Velna Hatchet, MD   Assessment & Plan: Visit Diagnoses:  1. Unilateral primary osteoarthritis, left knee   2. Unilateral primary osteoarthritis, right knee     Plan:  #1: Bilateral Euflexxa injections were given without difficulty.  Tolerated procedure well. #2: Follow back up as needed  Follow-Up Instructions: Return if symptoms worsen or fail to improve.   Orders:  No orders of the defined types were placed in this encounter.  No orders of the defined types were placed in this encounter.     Procedures: Large Joint Inj: bilateral knee on 04/28/2018 8:39 AM Indications: pain and joint swelling Details: 25 G 1.5 in needle, anteromedial approach  Arthrogram: No  Medications (Right): 2 mL lidocaine 1 %; 20 mg Sodium Hyaluronate 20 MG/2ML Medications (Left): 2 mL lidocaine 1 %; 20 mg Sodium Hyaluronate 20 MG/2ML Outcome: tolerated well, no immediate complications Procedure, treatment alternatives, risks and benefits explained, specific risks discussed. Consent was given by the patient. Immediately prior to procedure a time out was called to verify the correct patient, procedure, equipment, support staff and site/side marked as required. Patient was prepped and draped in the usual sterile fashion.       Clinical Data: No additional findings.   Subjective: Chief Complaint  Patient presents with  . Left Knee - Follow-up    Euflexxa injections started 04/11/18  . Right Knee - Follow-up    Euflexxa injections started 04/11/18  Patient presents today for her third Euflexxa injections bilaterally. She started the injections on 04/11/18. Patient states that she has noticed improvement and doing well.  HPI As above  Review of Systems   Constitutional: Negative for fatigue.  HENT: Negative for ear pain.   Eyes: Negative for pain.  Respiratory: Negative for shortness of breath.   Cardiovascular: Positive for leg swelling.  Gastrointestinal: Positive for constipation. Negative for diarrhea.  Endocrine: Negative for cold intolerance and heat intolerance.  Genitourinary: Negative for difficulty urinating.  Musculoskeletal: Negative for joint swelling.  Skin: Negative for rash.  Allergic/Immunologic: Negative for food allergies.  Neurological: Negative for weakness.  Hematological: Does not bruise/bleed easily.  Psychiatric/Behavioral: Negative for sleep disturbance.     Objective: Vital Signs: BP 131/79   Pulse 61   Ht 5\' 3"  (1.6 m)   Wt 260 lb (117.9 kg)   BMI 46.06 kg/m   Physical Exam Constitutional:      Appearance: Normal appearance. She is well-developed. She is obese.  Eyes:     Pupils: Pupils are equal, round, and reactive to light.  Pulmonary:     Effort: Pulmonary effort is normal.  Skin:    General: Skin is warm and dry.  Neurological:     Mental Status: She is alert and oriented to person, place, and time.  Psychiatric:        Behavior: Behavior normal.     Ortho Exam  Bilateral knees were quite benign.  No reactivity.  No warmth or erythema.  Specialty Comments:  No specialty comments available.  Imaging: No results found.   PMFS History: Current Outpatient Medications  Medication Sig Dispense Refill  . albuterol (PROVENTIL HFA;VENTOLIN  HFA) 108 (90 BASE) MCG/ACT inhaler Inhale 1-2 puffs into the lungs every 6 (six) hours as needed for wheezing or shortness of breath. 1 Inhaler 2  . aspirin EC 81 MG tablet Take 81 mg by mouth daily.     . Azelastine HCl 137 MCG/SPRAY SOLN   3  . betamethasone valerate ointment (VALISONE) 0.1 % Apply a pea sized amount topically BID for up to 2 weeks. Not for daily long term use. 30 g 0  . Biotin 5000 MCG CAPS Take 5,000 mcg by mouth daily.    .  diazepam (VALIUM) 5 MG tablet Take 0.5-1 tablets (2.5-5 mg total) by mouth every 6 (six) hours as needed for muscle spasms or sedation. 40 tablet 1  . docusate sodium (COLACE) 100 MG capsule Take 100 mg by mouth daily.     Marland Kitchen esomeprazole (NEXIUM) 40 MG capsule Take 40 mg by mouth daily.     . fluticasone (FLONASE) 50 MCG/ACT nasal spray Place 1 spray into both nostrils daily as needed for allergies.   1  . furosemide (LASIX) 20 MG tablet Take 3 tablets (60 mg total) by mouth daily. NEED OV. (Patient taking differently: Take 60 mg by mouth as needed. NEED OV.) 270 tablet 0  . gabapentin (NEURONTIN) 300 MG capsule Take 1 capsule (300 mg total) by mouth 2 (two) times daily. 180 capsule 1  . HYDROmorphone (DILAUDID) 2 MG tablet Take 1 tablet (2 mg total) by mouth every 4 (four) hours as needed for severe pain. 30 tablet 0  . ibuprofen (ADVIL,MOTRIN) 600 MG tablet   0  . levothyroxine (SYNTHROID, LEVOTHROID) 75 MCG tablet Take 75 mcg by mouth daily before breakfast.    . lisinopril (PRINIVIL,ZESTRIL) 20 MG tablet Take 1 tablet (20 mg total) by mouth daily. 90 tablet 3  . loratadine (CLARITIN) 10 MG tablet Take 10 mg by mouth daily.    . magnesium oxide (MAG-OX) 400 MG tablet Take 400 mg by mouth daily.    . meloxicam (MOBIC) 15 MG tablet   3  . metoprolol tartrate (LOPRESSOR) 25 MG tablet Take 25 mg by mouth 2 (two) times daily.    . Omega-3 Fatty Acids (FISH OIL) 1000 MG CAPS Take 1,000 mg by mouth daily.    . ondansetron (ZOFRAN) 4 MG tablet Take 1 tablet (4 mg total) by mouth every 8 (eight) hours as needed for nausea or vomiting. 20 tablet 0  . Polyethyl Glycol-Propyl Glycol (SYSTANE OP) Place 1 drop into both eyes daily as needed (dry eyes).     . vitamin C (ASCORBIC ACID) 500 MG tablet Take 500 mg by mouth daily.    . potassium chloride SA (KLOR-CON M20) 20 MEQ tablet Take 1 tablet (20 mEq total) by mouth daily. (Patient taking differently: Take 20 mEq by mouth as needed. ) 90 tablet 3   No  current facility-administered medications for this visit.     Patient Active Problem List   Diagnosis Date Noted  . Bilateral primary osteoarthritis of knee 12/27/2016  . 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 Prediabetes 12/03/2015  . OSA on CPAP 02/19/2015  . Morbid obesity ([place]) 10/09/2014  . Pulmonary HTN ([place]) 05/12/2013  . Bilateral lower extremity edema 05/12/2013  . Morbid obesity with BMI of 50.0-59.9, adult ([place])   . Back pain, hx of prior back surgery 12/09/2011  . Pancreatitis, mild, CT negative 12/08/2011  . Fatty liver 12/08/2011  . Substernal chest pain, suspected secondary to acute pancreatitis 12/06/2011  .  Hypertension 12/06/2011  . Hypokalemia 12/06/2011  . Hypothyroidism 12/06/2011  . Anxiety 12/06/2011  . BV (bacterial vaginosis)   . Hyperlipidemia    Past Medical History:  Diagnosis Date  . Anemia when she was young  . Arthritis   . Congestion of nasal sinus   . Constipation    takes Colace every other day  . DDD (degenerative disc disease), lumbar   . Diabetes mellitus without complication ([place])    per pt had been diagnosed but then "number started going down so my doctor said i didnt have diabetes"  . Dry eyes    uses Systane Eye drops daily as needed  . GERD (gastroesophageal reflux disease) 12/06/2011   takes Nexium daily  . Headache(784.0)    occasionally-d/t congestion  . Heart murmur   . History of bronchitis 6-7 yrs ago  . Hyperlipidemia   . Hypertension    takes Metoprolol and Diovan daily  . Hypothyroidism 12/06/2011   takes Synthroid daily  . Joint pain   . Multiple allergies    takes Zyrtec daily;uses Nasonex daily as needed  . Nocturia   . Numbness    weakness-right hand  . OSA on CPAP   . Pancreatitis   . Peripheral edema    takes Furosemide daily  . Shingles   . Urinary frequency   . Venous insufficiency of leg     Family History  Problem Relation Age of Onset  . Hypertension Mother   .  Hypertension Father   . Hypertension Sister   . Kidney disease Sister        [facility] TRANSPLANT  . Thyroid disease Sister   . Thyroid disease Sister   . Hypertension Sister   . Breast cancer Maternal Aunt        >50; passed away from it  . Breast cancer Maternal Aunt        >50    Past Surgical History:  Procedure Laterality Date  . ABDOMINAL HYSTERECTOMY     uterine prolapse  . BACK SURGERY     fusion  . CARDIAC SURGERY    . CHOLECYSTECTOMY    . COLONOSCOPY    . ESOPHAGOGASTRODUODENOSCOPY  5-32yrs ago  . FOOT SURGERY Bilateral   . KNEE ARTHROSCOPY    . LAPAROSCOPIC GASTRIC SLEEVE RESECTION N/A 08/23/2016   Procedure: LAPAROSCOPIC GASTRIC SLEEVE RESECTION WITH UPPER ENDO;  Surgeon: Johnathan Hausen, MD;  [facility];  Service: General;  Laterality: N/A;  . SHOULDER ARTHROSCOPY WITH ROTATOR CUFF REPAIR AND SUBACROMIAL DECOMPRESSION Right 08/23/2013   Procedure: RIGHT SHOULDER ARTHROSCOPY WITH  SUBACROMIAL DECOMPRESSION DISTAL CLAVICLE RESECTION AND  ROTATOR CUFF REPAIR ;  Surgeon: Marin Shutter, MD;  Location: [place];  Service: Orthopedics;  Laterality: Right;  . TUBAL LIGATION     Social History   Occupational History    Employer: [employer]    Comment: [employer]  Tobacco Use  . Smoking status: Former Smoker    Packs/day: 0.75    Years: 40.00    Pack years: 30.00    Types: Cigarettes    Last attempt to quit: 1993    Years since quitting: 27.2  . Smokeless tobacco: Never Used  . Tobacco comment: quit smoking in 1993  Substance and Sexual Activity  . Alcohol use: Yes    Alcohol/week: 0.0 standard drinks    Comment: rarely;; mixed drink   . Drug use: No  . Sexual activity: Not Currently    Partners: Male    Birth control/protection:  Surgical

## 2018-05-01 ENCOUNTER — Ambulatory Visit: Payer: 59 | Admitting: Physical Therapy

## 2018-05-02 ENCOUNTER — Telehealth: Payer: Self-pay | Admitting: Physical Therapy

## 2018-05-02 NOTE — Telephone Encounter (Signed)
Patient called to discuss telehealth options. She reports at this time her computer is not working. She is doing her exercises. She is interested in a follow up visit when the clinic re-opens Patient advised to call with any questions in the meantime.

## 2018-05-02 NOTE — Telephone Encounter (Deleted)
Patient called to discuss telehealth options. She reports at this time her computer is not working. She is doing her exercises. She is interested in a follow up visit when the clinic re-opens

## 2018-05-02 NOTE — Telephone Encounter (Signed)
Therapy contacted patient regarding tele-health. Patient reports she does not have a computer that works. She will be continue to work on her exercises at home. The clinic will contact her when it is time to get up and running again. She was advised to call with any questions.

## 2018-05-03 ENCOUNTER — Ambulatory Visit: Payer: 59 | Admitting: Physical Therapy

## 2018-05-10 ENCOUNTER — Encounter: Payer: 59 | Admitting: Physical Therapy

## 2018-05-12 ENCOUNTER — Encounter: Payer: 59 | Admitting: Physical Therapy

## 2018-05-16 ENCOUNTER — Ambulatory Visit: Payer: 59 | Admitting: Obstetrics and Gynecology

## 2018-05-16 ENCOUNTER — Encounter: Payer: Self-pay | Admitting: Obstetrics and Gynecology

## 2018-05-16 ENCOUNTER — Ambulatory Visit: Payer: 59 | Admitting: Podiatry

## 2018-05-16 ENCOUNTER — Other Ambulatory Visit: Payer: Self-pay

## 2018-05-16 VITALS — BP 138/76 | HR 76 | Temp 98.2°F | Resp 14 | Wt 267.0 lb

## 2018-05-16 DIAGNOSIS — R3915 Urgency of urination: Secondary | ICD-10-CM | POA: Diagnosis not present

## 2018-05-16 DIAGNOSIS — R82998 Other abnormal findings in urine: Secondary | ICD-10-CM | POA: Diagnosis not present

## 2018-05-16 LAB — POCT URINALYSIS DIPSTICK
Bilirubin, UA: NEGATIVE
Blood, UA: NEGATIVE
Glucose, UA: NEGATIVE
Ketones, UA: NEGATIVE
Leukocytes, UA: NEGATIVE
Nitrite, UA: NEGATIVE
Protein, UA: NEGATIVE
Urobilinogen, UA: 0.2 E.U./dL
pH, UA: 5 (ref 5.0–8.0)

## 2018-05-16 NOTE — Progress Notes (Signed)
GYNECOLOGY  VISIT   HPI: 58 y.o.   Divorced  [place] American  female   [phone] with No LMP recorded. Patient has had a hysterectomy.   here for possible UTI; pressure in lower abdomen, urgency and dark color urine since Sunday.  States her urine is a brownish color this last weekend.  Having lower abdominal discomfort.  No dysuria.   No UTI years.     Drinks a gallon of water per day.  One caffeine per day.   She takes Lasix every 1 - 2 days.  She is not taking daily because she swelling is less.   BM every 2 - 3 days.   One partner for 4 years.  Not active very often.   Urine: Negative  GYNECOLOGIC HISTORY: No LMP recorded. Patient has had a hysterectomy. for prolapse.  Ovaries remain.  Contraception:  Hysterectomy Menopausal hormone therapy:  none Last mammogram:  12/16/17 BIRADS 1 negative/density b Last pap smear:   2015 per patient WNL        OB History    Gravida  2   Para  2   Term  2   Preterm      AB      Living  2     SAB      TAB      Ectopic      Multiple      Live Births  2              Patient Active Problem List   Diagnosis Date Noted  . Bilateral primary osteoarthritis of knee 12/27/2016  . 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 Prediabetes 12/03/2015  . OSA on CPAP 02/19/2015  . Morbid obesity ([place]) 10/09/2014  . Pulmonary HTN ([place]) 05/12/2013  . Bilateral lower extremity edema 05/12/2013  . Morbid obesity with BMI of 50.0-59.9, adult ([place])   . Back pain, hx of prior back surgery 12/09/2011  . Pancreatitis, mild, CT negative 12/08/2011  . Fatty liver 12/08/2011  . Substernal chest pain, suspected secondary to acute pancreatitis 12/06/2011  . Hypertension 12/06/2011  . Hypokalemia 12/06/2011  . Hypothyroidism 12/06/2011  . Anxiety 12/06/2011  . BV (bacterial vaginosis)   . Hyperlipidemia     Past Medical History:  Diagnosis Date  . Anemia when she was young  . Arthritis   . Congestion of  nasal sinus   . Constipation    takes Colace every other day  . DDD (degenerative disc disease), lumbar   . Diabetes mellitus without complication ([place])    per pt had been diagnosed but then "number started going down so my doctor said i didnt have diabetes"  . Dry eyes    uses Systane Eye drops daily as needed  . GERD (gastroesophageal reflux disease) 12/06/2011   takes Nexium daily  . Headache(784.0)    occasionally-d/t congestion  . Heart murmur   . History of bronchitis 6-7 yrs ago  . Hyperlipidemia   . Hypertension    takes Metoprolol and Diovan daily  . Hypothyroidism 12/06/2011   takes Synthroid daily  . Joint pain   . Multiple allergies    takes Zyrtec daily;uses Nasonex daily as needed  . Nocturia   . Numbness    weakness-right hand  . OSA on CPAP   . Pancreatitis   . Peripheral edema    takes Furosemide daily  . Shingles   . Urinary frequency   . Venous insufficiency of leg  Past Surgical History:  Procedure Laterality Date  . ABDOMINAL HYSTERECTOMY     uterine prolapse  . BACK SURGERY     fusion  . CARDIAC SURGERY    . CHOLECYSTECTOMY    . COLONOSCOPY    . ESOPHAGOGASTRODUODENOSCOPY  5-86yrs ago  . FOOT SURGERY Bilateral   . KNEE ARTHROSCOPY    . LAPAROSCOPIC GASTRIC SLEEVE RESECTION N/A 08/23/2016   Procedure: LAPAROSCOPIC GASTRIC SLEEVE RESECTION WITH UPPER ENDO;  Surgeon: Johnathan Hausen, MD;  Location: [facility];  Service: General;  Laterality: N/A;  . SHOULDER ARTHROSCOPY WITH ROTATOR CUFF REPAIR AND SUBACROMIAL DECOMPRESSION Right 08/23/2013   Procedure: RIGHT SHOULDER ARTHROSCOPY WITH  SUBACROMIAL DECOMPRESSION DISTAL CLAVICLE RESECTION AND  ROTATOR CUFF REPAIR ;  Surgeon: Marin Shutter, MD;  Location: [facility];  Service: Orthopedics;  Laterality: Right;  . TUBAL LIGATION      Current Outpatient Medications  Medication Sig Dispense Refill  . albuterol (PROVENTIL HFA;VENTOLIN HFA) 108 (90 BASE) MCG/ACT inhaler Inhale 1-2 puffs into the lungs every 6  (six) hours as needed for wheezing or shortness of breath. 1 Inhaler 2  . aspirin EC 81 MG tablet Take 81 mg by mouth daily.     . Azelastine HCl 137 MCG/SPRAY SOLN   3  . betamethasone valerate ointment (VALISONE) 0.1 % Apply a pea sized amount topically BID for up to 2 weeks. Not for daily long term use. 30 g 0  . Biotin 5000 MCG CAPS Take 5,000 mcg by mouth daily.    . diazepam (VALIUM) 5 MG tablet Take 0.5-1 tablets (2.5-5 mg total) by mouth every 6 (six) hours as needed for muscle spasms or sedation. 40 tablet 1  . docusate sodium (COLACE) 100 MG capsule Take 100 mg by mouth daily.     Marland Kitchen esomeprazole (NEXIUM) 40 MG capsule Take 40 mg by mouth daily.     . fluticasone (FLONASE) 50 MCG/ACT nasal spray Place 1 spray into both nostrils daily as needed for allergies.   1  . furosemide (LASIX) 20 MG tablet Take 3 tablets (60 mg total) by mouth daily. NEED OV. (Patient taking differently: Take 60 mg by mouth as needed. NEED OV.) 270 tablet 0  . gabapentin (NEURONTIN) 300 MG capsule Take 1 capsule (300 mg total) by mouth 2 (two) times daily. 180 capsule 1  . HYDROmorphone (DILAUDID) 2 MG tablet Take 1 tablet (2 mg total) by mouth every 4 (four) hours as needed for severe pain. 30 tablet 0  . ibuprofen (ADVIL,MOTRIN) 600 MG tablet   0  . levothyroxine (SYNTHROID, LEVOTHROID) 75 MCG tablet Take 75 mcg by mouth daily before breakfast.    . lisinopril (PRINIVIL,ZESTRIL) 20 MG tablet Take 1 tablet (20 mg total) by mouth daily. 90 tablet 3  . loratadine (CLARITIN) 10 MG tablet Take 10 mg by mouth daily.    . meloxicam (MOBIC) 15 MG tablet   3  . metoprolol tartrate (LOPRESSOR) 25 MG tablet Take 25 mg by mouth 2 (two) times daily.    . ondansetron (ZOFRAN) 4 MG tablet Take 1 tablet (4 mg total) by mouth every 8 (eight) hours as needed for nausea or vomiting. 20 tablet 0  . Polyethyl Glycol-Propyl Glycol (SYSTANE OP) Place 1 drop into both eyes daily as needed (dry eyes).     . potassium chloride SA  (KLOR-CON M20) 20 MEQ tablet Take 1 tablet (20 mEq total) by mouth daily. (Patient taking differently: Take 20 mEq by mouth as needed. ) 90 tablet 3  .  vitamin C (ASCORBIC ACID) 500 MG tablet Take 500 mg by mouth daily.    . magnesium oxide (MAG-OX) 400 MG tablet Take 400 mg by mouth daily.    . Omega-3 Fatty Acids (FISH OIL) 1000 MG CAPS Take 1,000 mg by mouth daily.     No current facility-administered medications for this visit.      ALLERGIES: Metronidazole; Adhesive [tape]; Latex; Penicillins; and Vesicare [solifenacin]  Family History  Problem Relation Age of Onset  . Hypertension Mother   . Hypertension Father   . Hypertension Sister   . Kidney disease Sister        [facility] TRANSPLANT  . Thyroid disease Sister   . Thyroid disease Sister   . Hypertension Sister   . Breast cancer Maternal Aunt        >50; passed away from it  . Breast cancer Maternal Aunt        >50    Social History   Socioeconomic History  . Marital status: Divorced    Spouse name: Not on file  . Number of children: 2  . Years of education: College  . Highest education level: Not on file  Occupational History    Employer: [employer]    Comment: [employer]  Social Needs  . Financial resource strain: Not on file  . Food insecurity:    Worry: Not on file    Inability: Not on file  . Transportation needs:    Medical: Not on file    Non-medical: Not on file  Tobacco Use  . Smoking status: Former Smoker    Packs/day: 0.75    Years: 40.00    Pack years: 30.00    Types: Cigarettes    Last attempt to quit: 1993    Years since quitting: 27.3  . Smokeless tobacco: Never Used  . Tobacco comment: quit smoking in 1993  Substance and Sexual Activity  . Alcohol use: Yes    Alcohol/week: 0.0 standard drinks    Comment: rarely;; mixed drink   . Drug use: No  . Sexual activity: Not Currently    Partners: Male    Birth control/protection: Surgical  Lifestyle  . Physical activity:    Days per week: Not on  file    Minutes per session: Not on file  . Stress: Not on file  Relationships  . Social connections:    Talks on phone: Not on file    Gets together: Not on file    Attends religious service: Not on file    Active member of club or organization: Not on file    Attends meetings of clubs or organizations: Not on file    Relationship status: Not on file  . Intimate partner violence:    Fear of current or ex partner: Not on file    Emotionally abused: Not on file    Physically abused: Not on file    Forced sexual activity: Not on file  Other Topics Concern  . Not on file  Social History Narrative   Caffeine: Coffee    Review of Systems  Constitutional: Negative.   HENT: Negative.   Eyes: Negative.   Respiratory: Negative.   Cardiovascular: Negative.   Gastrointestinal: Negative.   Endocrine: Negative.   Genitourinary: Positive for urgency.       Pressure in lower abdomen Dark color urine  Musculoskeletal: Negative.   Skin: Negative.   Allergic/Immunologic: Negative.   Neurological: Negative.   Hematological: Negative.   Psychiatric/Behavioral: Negative.  PHYSICAL EXAMINATION:    BP 138/76 (BP Location: Left Arm, Patient Position: Sitting, Cuff Size: Large)   Pulse 76   Temp 98.2 F (36.8 C) (Oral)   Resp 14   Wt 267 lb (121.1 kg)   BMI 47.30 kg/m     General appearance: alert, cooperative and appears stated age   Abdomen: obese, abdomen is soft, non-tender, no masses,  no organomegaly   Pelvic: External genitalia:  Hypopigmentation of inferior vulva.               Urethra:  normal appearing urethra with no masses, tenderness or lesions              Bartholins and Skenes: normal                 Vagina: normal appearing vagina with normal color and discharge, no lesions              Cervix:absent                Bimanual Exam:  Uterus:  absent              Adnexa: no mass, fullness, tenderness              Rectal exam: Yes.  .  Confirms.              Anus:   normal sphincter tone, no lesions  Chaperone was present for exam.  ASSESSMENT  Dark urine.  Abdominal discomfort.  I suspect dehydration.  No evidence of UTI.   PLAN  Reduce caffeine use.  We discussed her lasix as a risk factor for dehydration as well.   She will follow up with her primary as needed. She will return for her annual exam with Dr. Talbert Nan in June and prn.    An After Visit Summary was printed and given to the patient.  __15____ minutes face to face time of which over 50% was spent in counseling.

## 2018-05-16 NOTE — Patient Instructions (Signed)
Dehydration, Adult  Dehydration is a condition in which there is not enough fluid or water in the body. This happens when you lose more fluids than you take in. Important organs, such as the kidneys, brain, and heart, cannot function without a proper amount of fluids. Any loss of fluids from the body can lead to dehydration. Dehydration can range from mild to severe. This condition should be treated right away to prevent it from becoming severe. What are the causes? This condition may be caused by:  Vomiting.  Diarrhea.  Excessive sweating, such as from heat exposure or exercise.  Not drinking enough fluid, especially: ? When ill. ? While doing activity that requires a lot of energy.  Excessive urination.  Fever.  Infection.  Certain medicines, such as medicines that cause the body to lose excess fluid (diuretics).  Inability to access safe drinking water.  Reduced physical ability to get adequate water and food. What increases the risk? This condition is more likely to develop in people:  Who have a poorly controlled long-term (chronic) illness, such as diabetes, heart disease, or kidney disease.  Who are age 65 or older.  Who are disabled.  Who live in a place with high altitude.  Who play endurance sports. What are the signs or symptoms? Symptoms of mild dehydration may include:  Thirst.  Dry lips.  Slightly dry mouth.  Dry, warm skin.  Dizziness. Symptoms of moderate dehydration may include:  Very dry mouth.  Muscle cramps.  Dark urine. Urine may be the color of tea.  Decreased urine production.  Decreased tear production.  Heartbeat that is irregular or faster than normal (palpitations).  Headache.  Light-headedness, especially when you stand up from a sitting position.  Fainting (syncope). Symptoms of severe dehydration may include:  Changes in skin, such as: ? Cold and clammy skin. ? Blotchy (mottled) or pale skin. ? Skin that does  not quickly return to normal after being lightly pinched and released (poor skin turgor).  Changes in body fluids, such as: ? Extreme thirst. ? No tear production. ? Inability to sweat when body temperature is high, such as in hot weather. ? Very little urine production.  Changes in vital signs, such as: ? Weak pulse. ? Pulse that is more than 100 beats a minute when sitting still. ? Rapid breathing. ? Low blood pressure.  Other changes, such as: ? Sunken eyes. ? Cold hands and feet. ? Confusion. ? Lack of energy (lethargy). ? Difficulty waking up from sleep. ? Short-term weight loss. ? Unconsciousness. How is this diagnosed? This condition is diagnosed based on your symptoms and a physical exam. Blood and urine tests may be done to help confirm the diagnosis. How is this treated? Treatment for this condition depends on the severity. Mild or moderate dehydration can often be treated at home. Treatment should be started right away. Do not wait until dehydration becomes severe. Severe dehydration is an emergency and it needs to be treated in a hospital. Treatment for mild dehydration may include:  Drinking more fluids.  Replacing salts and minerals in your blood (electrolytes) that you may have lost. Treatment for moderate dehydration may include:  Drinking an oral rehydration solution (ORS). This is a drink that helps you replace fluids and electrolytes (rehydrate). It can be found at pharmacies and retail stores. Treatment for severe dehydration may include:  Receiving fluids through an IV tube.  Receiving an electrolyte solution through a feeding tube that is passed through your nose and   into your stomach (nasogastric tube, or NG tube).  Correcting any abnormalities in electrolytes.  Treating the underlying cause of dehydration. Follow these instructions at home:  If directed by your health care provider, drink an ORS: ? Make an ORS by following instructions on the  package. ? Start by drinking small amounts, about  cup (120 mL) every 5-10 minutes. ? Slowly increase how much you drink until you have taken the amount recommended by your health care provider.  Drink enough clear fluid to keep your urine clear or pale yellow. If you were told to drink an ORS, finish the ORS first, then start slowly drinking other clear fluids. Drink fluids such as: ? Water. Do not drink only water. Doing that can lead to having too little salt (sodium) in the body (hyponatremia). ? Ice chips. ? Fruit juice that you have added water to (diluted fruit juice). ? Low-calorie sports drinks.  Avoid: ? Alcohol. ? Drinks that contain a lot of sugar. These include high-calorie sports drinks, fruit juice that is not diluted, and soda. ? Caffeine. ? Foods that are greasy or contain a lot of fat or sugar.  Take over-the-counter and prescription medicines only as told by your health care provider.  Do not take sodium tablets. This can lead to having too much sodium in the body (hypernatremia).  Eat foods that contain a healthy balance of electrolytes, such as bananas, oranges, potatoes, tomatoes, and spinach.  Keep all follow-up visits as told by your health care provider. This is important. Contact a health care provider if:  You have abdominal pain that: ? Gets worse. ? Stays in one area (localizes).  You have a rash.  You have a stiff neck.  You are more irritable than usual.  You are sleepier or more difficult to wake up than usual.  You feel weak or dizzy.  You feel very thirsty.  You have urinated only a small amount of very dark urine over 6-8 hours. Get help right away if:  You have symptoms of severe dehydration.  You cannot drink fluids without vomiting.  Your symptoms get worse with treatment.  You have a fever.  You have a severe headache.  You have vomiting or diarrhea that: ? Gets worse. ? Does not go away.  You have blood or green matter  (bile) in your vomit.  You have blood in your stool. This may cause stool to look black and tarry.  You have not urinated in 6-8 hours.  You faint.  Your heart rate while sitting still is over 100 beats a minute.  You have trouble breathing. This information is not intended to replace advice given to you by your health care provider. Make sure you discuss any questions you have with your health care provider. Document Released: 01/11/2005 Document Revised: 08/08/2015 Document Reviewed: 03/07/2015 Elsevier Interactive Patient Education  2019 [employer]  

## 2018-05-26 ENCOUNTER — Telehealth (INDEPENDENT_AMBULATORY_CARE_PROVIDER_SITE_OTHER): Payer: Self-pay | Admitting: Physical Medicine and Rehabilitation

## 2018-05-26 NOTE — Telephone Encounter (Signed)
Ok to repeat 

## 2018-05-26 NOTE — Telephone Encounter (Signed)
Patient is scheduled for 5/27. She is requesting a prescription for a muscle relaxer. She has requested this from her PCP as well and will let us know if they send in an rx.

## 2018-05-29 ENCOUNTER — Other Ambulatory Visit: Payer: Self-pay | Admitting: Physical Medicine and Rehabilitation

## 2018-05-29 NOTE — Telephone Encounter (Signed)
Does she mean for general use or pre-procedure?

## 2018-05-29 NOTE — Telephone Encounter (Signed)
For general use.

## 2018-05-31 ENCOUNTER — Telehealth: Payer: Self-pay | Admitting: Physical Therapy

## 2018-05-31 NOTE — Telephone Encounter (Signed)
Contacted patient to offer an appointment to resume physical therapy services. Left voicemail asking her to return call to clinic if she would like to resume at this time.

## 2018-06-07 ENCOUNTER — Other Ambulatory Visit: Payer: Self-pay

## 2018-06-07 ENCOUNTER — Encounter: Payer: Self-pay | Admitting: Physical Therapy

## 2018-06-07 ENCOUNTER — Ambulatory Visit: Payer: 59 | Attending: Podiatry | Admitting: Physical Therapy

## 2018-06-07 DIAGNOSIS — R600 Localized edema: Secondary | ICD-10-CM | POA: Insufficient documentation

## 2018-06-07 DIAGNOSIS — M25571 Pain in right ankle and joints of right foot: Secondary | ICD-10-CM | POA: Diagnosis not present

## 2018-06-07 DIAGNOSIS — M6281 Muscle weakness (generalized): Secondary | ICD-10-CM | POA: Insufficient documentation

## 2018-06-07 DIAGNOSIS — R2689 Other abnormalities of gait and mobility: Secondary | ICD-10-CM | POA: Insufficient documentation

## 2018-06-07 NOTE — Therapy (Signed)
[place], [place], [address] Phone: [phone]   Fax:  [phone]  Physical Therapy Treatment / Re-certification  Patient Details  Name: [name] MRN: [id number] Date of Birth: [birth year] Referring Provider (PT): Tyson Dense DPM   Encounter Date: 06/07/2018  PT End of Session - 06/07/18 0923    Visit Number  4    Number of Visits  13    Date for PT Re-Evaluation  07/19/18    Authorization Type  [employer]    PT Start Time  0845    PT Stop Time  0923    PT Time Calculation (min)  38 min    Activity Tolerance  Patient tolerated treatment well    Behavior During Therapy  [facility] for tasks assessed/performed       Past Medical History:  Diagnosis Date  . Anemia when she was young  . Arthritis   . Congestion of nasal sinus   . Constipation    takes Colace every other day  . DDD (degenerative disc disease), lumbar   . Diabetes mellitus without complication ([place])    per pt had been diagnosed but then "number started going down so my doctor said i didnt have diabetes"  . Dry eyes    uses Systane Eye drops daily as needed  . GERD (gastroesophageal reflux disease) 12/06/2011   takes Nexium daily  . Headache(784.0)    occasionally-d/t congestion  . Heart murmur   . History of bronchitis 6-7 yrs ago  . Hyperlipidemia   . Hypertension    takes Metoprolol and Diovan daily  . Hypothyroidism 12/06/2011   takes Synthroid daily  . Joint pain   . Multiple allergies    takes Zyrtec daily;uses Nasonex daily as needed  . Nocturia   . Numbness    weakness-right hand  . OSA on CPAP   . Pancreatitis   . Peripheral edema    takes Furosemide daily  . Shingles   . Urinary frequency   . Venous insufficiency of leg     Past Surgical History:  Procedure Laterality Date  . ABDOMINAL HYSTERECTOMY     uterine prolapse  . BACK SURGERY     fusion  . CARDIAC SURGERY    . CHOLECYSTECTOMY    .  COLONOSCOPY    . ESOPHAGOGASTRODUODENOSCOPY  5-64yrs ago  . FOOT SURGERY Bilateral   . KNEE ARTHROSCOPY    . LAPAROSCOPIC GASTRIC SLEEVE RESECTION N/A 08/23/2016   Procedure: LAPAROSCOPIC GASTRIC SLEEVE RESECTION WITH UPPER ENDO;  Surgeon: Johnathan Hausen, MD;  Location: [facility];  Service: General;  Laterality: N/A;  . SHOULDER ARTHROSCOPY WITH ROTATOR CUFF REPAIR AND SUBACROMIAL DECOMPRESSION Right 08/23/2013   Procedure: RIGHT SHOULDER ARTHROSCOPY WITH  SUBACROMIAL DECOMPRESSION DISTAL CLAVICLE RESECTION AND  ROTATOR CUFF REPAIR ;  Surgeon: Marin Shutter, MD;  Location: [place];  Service: Orthopedics;  Laterality: Right;  . TUBAL LIGATION      There were no vitals filed for this visit.  Subjective Assessment - 06/07/18 0848    Subjective  " I do notice my foot is still bothering me, but I do think it is alittle better than it was"     Currently in Pain?  Yes    Pain Score  5     Pain Location  Ankle    Pain Orientation  Right    Pain Type  Chronic pain    Pain Onset  More than a month ago  Pain Frequency  Intermittent    Aggravating Factors   standing/ walking step    Pain Relieving Factors  medication, ice         [facility] PT Assessment - 06/07/18 0001      Assessment   Medical Diagnosis  R posterior tibialis tendinitis    Referring Provider (PT)  Max Hyatt DPM    Hand Dominance  Right      AROM   Right Ankle Dorsiflexion  3    Right Ankle Plantar Flexion  48    Right Ankle Inversion  20    Right Ankle Eversion  23      PROM   Right Ankle Dorsiflexion  10    Right Ankle Plantar Flexion  60      Strength   Overall Strength Comments  posterior tib on the R 4/5 with increased pain during testing    Right Ankle Dorsiflexion  4/5    Right Ankle Plantar Flexion  4/5    Right Ankle Inversion  4/5    Right Ankle Eversion  4/5      Palpation   Palpation comment  TTP along the navicular tuberosity and distal posterior tib                   [facility] Adult PT  Treatment/Exercise - 06/07/18 0903      Exercises   Exercises  Knee/Hip      Knee/Hip Exercises: Sidelying   Other Sidelying Knee/Hip Exercises  R sidelying R hip ER 2 x 15      Manual Therapy   Manual Therapy  Soft tissue mobilization    Manual therapy comments  skilled palaption and monitoring of pt throughout TPDN    Joint Mobilization  Talocrural Distraction       Ankle Exercises: Stretches   Slant Board Stretch  2 reps;30 seconds   gastroc     Ankle Exercises: Supine   Other Supine Ankle Exercises  posterior tib strengthening with manual resistance 2 x 15 focusing on eccentric loading   intermittent verbal cues for proper form      Trigger Point Dry Needling - 06/07/18 0001    Consent Given?  Yes    Education Handout Provided  Yes    Muscles Treated Lower Quadrant  Posterior tibialis    Posterior tibialis Response  Palpable increased muscle length;Twitch response elicited           PT Education - 06/07/18 0856    Education Details  reviewed previously provided HEP. Muscle anatomy and referral patterns. What TPDN is, benefits and what to expect.     Person(s) Educated  Patient    Methods  Explanation;Verbal cues;Handout    Comprehension  Verbalized understanding;Verbal cues required       PT Short Term Goals - 06/07/18 0917      PT SHORT TERM GOAL #1   Title  Pt to be I with initial HEP    Period  Weeks    Status  Achieved      PT SHORT TERM GOAL #2   Title  Pt to verbalize/ demo proper posture and gait mechanics to prevent and reduce foot/ and hip pain    Period  Weeks    Status  Achieved        PT Long Term Goals - 06/07/18 0938      PT LONG TERM GOAL #1   Title  Pt to increase L ankle strength to >/= 4+/5 in all planes  to promote ankle stability     Period  Weeks    Status  On-going    Target Date  07/19/18      PT LONG TERM GOAL #2   Title  Pt to be able to walk / stand >/= 60 min and navigate up/down 12 steps reciprocally with </=3/10 pain      Period  Weeks    Status  On-going    Target Date  07/19/18      PT LONG TERM GOAL #3   Title  Increase FOTO limitation to </= 50% limited to demo improvement in function.     Time  6    Period  Weeks    Status  Unable to assess    Target Date  07/19/18      PT LONG TERM GOAL #4   Target Date  07/19/18            Plan - 06/07/18 6283    Clinical Impression Statement  pt reports consistency with her exercises at home and states she is doing better but conitnues to have pain. she is making good progress toward her goals.  educated and pt consented for TPDN focusing on tibialis posterior followed with IASTM and ankle mobs. she responded well to strengthening of the hip to promote distal LE control. plan to continue with current POC to decrease foot pain/ tightness and promote endurance and overall maximizing her function.     Rehab Potential  Good    PT Frequency  1x / week    PT Duration  6 weeks    PT Treatment/Interventions  ADLs/Self Care Home Management;Cryotherapy;Electrical Stimulation;Iontophoresis 4mg /ml Dexamethasone;Moist Heat;Ultrasound;Therapeutic exercise;Taping;Vasopneumatic Device;Manual techniques;Dry needling;Patient/family education;Therapeutic activities;Passive range of motion    PT Next Visit Plan  posterior tib STW, foot intrinsics, hip ER and abductor strengthening, vaso for pain/ swelling PRN, gait training, response to TPDN    PT Home Exercise Plan  4-way ankle strength, towel scrunch, calf stretch (seated and standing ), toe yoga, hip ER in R sidelying    Consulted and Agree with Plan of Care  Patient       Patient will benefit from skilled therapeutic intervention in order to improve the following deficits and impairments:  Pain, Decreased strength, Decreased endurance, Decreased activity tolerance, Postural dysfunction, Improper body mechanics, Decreased range of motion, Abnormal gait, Increased edema, Increased fascial restricitons  Visit  Diagnosis: Pain in right ankle and joints of right foot  Other abnormalities of gait and mobility  Localized edema  Muscle weakness (generalized)     Problem List Patient Active Problem List   Diagnosis Date Noted  . Bilateral primary osteoarthritis of knee 12/27/2016  . 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 Prediabetes 12/03/2015  . OSA on CPAP 02/19/2015  . Morbid obesity ([place]) 10/09/2014  . Pulmonary HTN ([place]) 05/12/2013  . Bilateral lower extremity edema 05/12/2013  . Morbid obesity with BMI of 50.0-59.9, adult ([place])   . Back pain, hx of prior back surgery 12/09/2011  . Pancreatitis, mild, CT negative 12/08/2011  . Fatty liver 12/08/2011  . Substernal chest pain, suspected secondary to acute pancreatitis 12/06/2011  . Hypertension 12/06/2011  . Hypokalemia 12/06/2011  . Hypothyroidism 12/06/2011  . Anxiety 12/06/2011  . BV (bacterial vaginosis)   . Hyperlipidemia    Starr Lake PT, DPT, LAT, ATC  06/07/18  9:29 AM      [facility] [address] [place], [place], [address] Phone: [phone]  Fax:  [phone]  Name: Harini Dearmond MRN: [id number] Date of Birth: 1960/04/26

## 2018-06-12 DIAGNOSIS — Z0000 Encounter for general adult medical examination without abnormal findings: Secondary | ICD-10-CM | POA: Diagnosis not present

## 2018-06-15 ENCOUNTER — Encounter: Payer: Self-pay | Admitting: Physical Therapy

## 2018-06-15 ENCOUNTER — Other Ambulatory Visit: Payer: Self-pay

## 2018-06-15 ENCOUNTER — Ambulatory Visit: Payer: 59 | Admitting: Physical Therapy

## 2018-06-15 DIAGNOSIS — R600 Localized edema: Secondary | ICD-10-CM | POA: Diagnosis not present

## 2018-06-15 DIAGNOSIS — M6281 Muscle weakness (generalized): Secondary | ICD-10-CM | POA: Diagnosis not present

## 2018-06-15 DIAGNOSIS — R2689 Other abnormalities of gait and mobility: Secondary | ICD-10-CM | POA: Diagnosis not present

## 2018-06-15 DIAGNOSIS — M25571 Pain in right ankle and joints of right foot: Secondary | ICD-10-CM | POA: Diagnosis not present

## 2018-06-15 NOTE — Therapy (Signed)
[place], [place], [address] Phone: [phone]   Fax:  [phone]  Physical Therapy Treatment  Patient Details  Name: [name] MRN: [id number] Date of Birth: [birth year] Referring Provider (PT): Tyson Dense DPM   Encounter Date: 06/15/2018  PT End of Session - 06/15/18 0753    Visit Number  5    Number of Visits  13    Date for PT Re-Evaluation  07/19/18    Authorization Type  [facility] [employer]    PT Start Time  0755    PT Stop Time  0840    PT Time Calculation (min)  45 min    Activity Tolerance  Patient tolerated treatment well    Behavior During Therapy  [facility] for tasks assessed/performed       Past Medical History:  Diagnosis Date  . Anemia when she was young  . Arthritis   . Congestion of nasal sinus   . Constipation    takes Colace every other day  . DDD (degenerative disc disease), lumbar   . Diabetes mellitus without complication ([place])    per pt had been diagnosed but then "number started going down so my doctor said i didnt have diabetes"  . Dry eyes    uses Systane Eye drops daily as needed  . GERD (gastroesophageal reflux disease) 12/06/2011   takes Nexium daily  . Headache(784.0)    occasionally-d/t congestion  . Heart murmur   . History of bronchitis 6-7 yrs ago  . Hyperlipidemia   . Hypertension    takes Metoprolol and Diovan daily  . Hypothyroidism 12/06/2011   takes Synthroid daily  . Joint pain   . Multiple allergies    takes Zyrtec daily;uses Nasonex daily as needed  . Nocturia   . Numbness    weakness-right hand  . OSA on CPAP   . Pancreatitis   . Peripheral edema    takes Furosemide daily  . Shingles   . Urinary frequency   . Venous insufficiency of leg     Past Surgical History:  Procedure Laterality Date  . ABDOMINAL HYSTERECTOMY     uterine prolapse  . BACK SURGERY     fusion  . CARDIAC SURGERY    . CHOLECYSTECTOMY    . COLONOSCOPY    .  ESOPHAGOGASTRODUODENOSCOPY  5-67yrs ago  . FOOT SURGERY Bilateral   . KNEE ARTHROSCOPY    . LAPAROSCOPIC GASTRIC SLEEVE RESECTION N/A 08/23/2016   Procedure: LAPAROSCOPIC GASTRIC SLEEVE RESECTION WITH UPPER ENDO;  Surgeon: Johnathan Hausen, MD;  Location: [facility];  Service: General;  Laterality: N/A;  . SHOULDER ARTHROSCOPY WITH ROTATOR CUFF REPAIR AND SUBACROMIAL DECOMPRESSION Right 08/23/2013   Procedure: RIGHT SHOULDER ARTHROSCOPY WITH  SUBACROMIAL DECOMPRESSION DISTAL CLAVICLE RESECTION AND  ROTATOR CUFF REPAIR ;  Surgeon: Marin Shutter, MD;  Location: [place];  Service: Orthopedics;  Laterality: Right;  . TUBAL LIGATION      There were no vitals filed for this visit.  Subjective Assessment - 06/15/18 0801    Subjective  "I think I am doing better the DN did seem to help"    Currently in Pain?  Yes    Pain Score  3     Pain Location  Ankle    Pain Orientation  Right    Pain Descriptors / Indicators  Aching    Pain Type  Chronic pain    Pain Onset  More than a month ago    Pain Frequency  Intermittent    Aggravating Factors   prolonged standing, walking         [facility] PT Assessment - 06/15/18 0754      Assessment   Medical Diagnosis  R posterior tibialis tendinitis    Referring Provider (PT)  Max Hyatt DPM                   [facility] Adult PT Treatment/Exercise - 06/15/18 0755      Knee/Hip Exercises: Aerobic   Nustep  L7 x 5 min   with UE/Le     Knee/Hip Exercises: Standing   Hip Abduction  2 sets;15 reps;Knee straight;Both    Hip Extension  2 sets;15 reps;Knee straight;Both      Manual Therapy   Manual Therapy  Soft tissue mobilization;Taping;Other (comment)    Manual therapy comments  skilled palaption and monitoring of pt throughout TPDN    Soft tissue mobilization  IASTM along the posterior tib     Other Manual Therapy  Arch support taping      Ankle Exercises: Stretches   Slant Board Stretch  2 reps;30 seconds      Ankle Exercises: Supine   T-Band   4-way 1 x 20 ea. with green theraband       Trigger Point Dry Needling - 06/15/18 0001    Consent Given?  Yes    Education Handout Provided  Previously provided    Posterior tibialis Response  Palpable increased muscle length;Twitch response elicited        Balance Exercises - 06/15/18 0834      Balance Exercises: Standing   Standing Eyes Opened  Narrow base of support (BOS);3 reps;30 secs    Standing Eyes Closed  Narrow base of support (BOS);2 reps;30 secs        PT Education - 06/15/18 0823    Education Details  reviewed 4 way ankle strengthening with upgraded theraband resistance, updated HEP    Person(s) Educated  Patient    Methods  Explanation;Verbal cues    Comprehension  Verbalized understanding;Verbal cues required       PT Short Term Goals - 06/07/18 0917      PT SHORT TERM GOAL #1   Title  Pt to be I with initial HEP    Period  Weeks    Status  Achieved      PT SHORT TERM GOAL #2   Title  Pt to verbalize/ demo proper posture and gait mechanics to prevent and reduce foot/ and hip pain    Period  Weeks    Status  Achieved        PT Long Term Goals - 06/07/18 0973      PT LONG TERM GOAL #1   Title  Pt to increase L ankle strength to >/= 4+/5 in all planes to promote ankle stability     Period  Weeks    Status  On-going    Target Date  07/19/18      PT LONG TERM GOAL #2   Title  Pt to be able to walk / stand >/= 60 min and navigate up/down 12 steps reciprocally with </=3/10 pain     Period  Weeks    Status  On-going    Target Date  07/19/18      PT LONG TERM GOAL #3   Title  Increase FOTO limitation to </= 50% limited to demo improvement in function.     Time  6    Period  Weeks  Status  Unable to assess    Target Date  07/19/18      PT LONG TERM GOAL #4   Target Date  07/19/18            Plan - 06/15/18 0807    Clinical Impression Statement  continued TPDN focusing on the posterior tib followed with IASTM and arch support taping.  continued strengtheing of the ankle and hip to promote distal LE control. pt noted soreness at end of session but reported it did fell better with standing/ walking.     PT Treatment/Interventions  ADLs/Self Care Home Management;Cryotherapy;Electrical Stimulation;Iontophoresis 4mg /ml Dexamethasone;Moist Heat;Ultrasound;Therapeutic exercise;Taping;Vasopneumatic Device;Manual techniques;Dry needling;Patient/family education;Therapeutic activities;Passive range of motion    PT Next Visit Plan  posterior tib STW, foot intrinsics, hip ER and abductor strengthening,  gait training PRN, response to TPDN    PT Home Exercise Plan  4-way ankle strength, towel scrunch, calf stretch (seated and standing ), toe yoga, hip ER in R sidelying, standing hip abduction/ extension, corner balacne    Consulted and Agree with Plan of Care  Patient       Patient will benefit from skilled therapeutic intervention in order to improve the following deficits and impairments:  Pain, Decreased strength, Decreased endurance, Decreased activity tolerance, Postural dysfunction, Improper body mechanics, Decreased range of motion, Abnormal gait, Increased edema, Increased fascial restricitons  Visit Diagnosis: Pain in right ankle and joints of right foot  Other abnormalities of gait and mobility  Localized edema  Muscle weakness (generalized)     Problem List Patient Active Problem List   Diagnosis Date Noted  . Bilateral primary osteoarthritis of knee 12/27/2016  . 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 Prediabetes 12/03/2015  . OSA on CPAP 02/19/2015  . Morbid obesity ([place]) 10/09/2014  . Pulmonary HTN ([place]) 05/12/2013  . Bilateral lower extremity edema 05/12/2013  . Morbid obesity with BMI of 50.0-59.9, adult ([place])   . Back pain, hx of prior back surgery 12/09/2011  . Pancreatitis, mild, CT negative 12/08/2011  . Fatty liver 12/08/2011  . Substernal chest pain, suspected secondary to acute  pancreatitis 12/06/2011  . Hypertension 12/06/2011  . Hypokalemia 12/06/2011  . Hypothyroidism 12/06/2011  . Anxiety 12/06/2011  . BV (bacterial vaginosis)   . Hyperlipidemia    Starr Lake PT, DPT, LAT, ATC  06/15/18  8:43 AM      [facility] [address] [place], [place], [address] Phone: [phone]   Fax:  [phone]  Name: [name] MRN: [id number] Date of Birth: [birth year]

## 2018-06-20 DIAGNOSIS — M1990 Unspecified osteoarthritis, unspecified site: Secondary | ICD-10-CM | POA: Diagnosis not present

## 2018-06-20 DIAGNOSIS — E1169 Type 2 diabetes mellitus with other specified complication: Secondary | ICD-10-CM | POA: Diagnosis not present

## 2018-06-20 DIAGNOSIS — G4733 Obstructive sleep apnea (adult) (pediatric): Secondary | ICD-10-CM | POA: Diagnosis not present

## 2018-06-20 DIAGNOSIS — K760 Fatty (change of) liver, not elsewhere classified: Secondary | ICD-10-CM | POA: Diagnosis not present

## 2018-06-20 DIAGNOSIS — I10 Essential (primary) hypertension: Secondary | ICD-10-CM | POA: Diagnosis not present

## 2018-06-20 DIAGNOSIS — K219 Gastro-esophageal reflux disease without esophagitis: Secondary | ICD-10-CM | POA: Diagnosis not present

## 2018-06-20 DIAGNOSIS — Z1331 Encounter for screening for depression: Secondary | ICD-10-CM | POA: Diagnosis not present

## 2018-06-20 DIAGNOSIS — Z0000 Encounter for general adult medical examination without abnormal findings: Secondary | ICD-10-CM | POA: Diagnosis not present

## 2018-06-20 DIAGNOSIS — E785 Hyperlipidemia, unspecified: Secondary | ICD-10-CM | POA: Diagnosis not present

## 2018-06-22 ENCOUNTER — Ambulatory Visit: Payer: 59 | Admitting: Physical Medicine and Rehabilitation

## 2018-06-23 ENCOUNTER — Encounter: Payer: Self-pay | Admitting: Physical Therapy

## 2018-06-23 ENCOUNTER — Other Ambulatory Visit: Payer: Self-pay

## 2018-06-23 ENCOUNTER — Ambulatory Visit: Payer: 59 | Admitting: Physical Therapy

## 2018-06-23 DIAGNOSIS — M6281 Muscle weakness (generalized): Secondary | ICD-10-CM | POA: Diagnosis not present

## 2018-06-23 DIAGNOSIS — R2689 Other abnormalities of gait and mobility: Secondary | ICD-10-CM | POA: Diagnosis not present

## 2018-06-23 DIAGNOSIS — R600 Localized edema: Secondary | ICD-10-CM | POA: Diagnosis not present

## 2018-06-23 DIAGNOSIS — M25571 Pain in right ankle and joints of right foot: Secondary | ICD-10-CM

## 2018-06-23 NOTE — Therapy (Signed)
[place], [place], [address] Phone: [phone]   Fax:  [phone]  Physical Therapy Treatment  Patient Details  Name: Maven Rosander MRN: [id number] Date of Birth: [birth year] Referring Provider (PT): Tyson Dense DPM   Encounter Date: 06/23/2018  PT End of Session - 06/23/18 0844    Visit Number  6    Number of Visits  13    Date for PT Re-Evaluation  07/19/18    Authorization Type  [facility]    PT Start Time  0845    PT Stop Time  0924    PT Time Calculation (min)  39 min    Activity Tolerance  Patient tolerated treatment well    Behavior During Therapy  [facility] for tasks assessed/performed       Past Medical History:  Diagnosis Date  . Anemia when she was young  . Arthritis   . Congestion of nasal sinus   . Constipation    takes Colace every other day  . DDD (degenerative disc disease), lumbar   . Diabetes mellitus without complication ([place])    per pt had been diagnosed but then "number started going down so my doctor said i didnt have diabetes"  . Dry eyes    uses Systane Eye drops daily as needed  . GERD (gastroesophageal reflux disease) 12/06/2011   takes Nexium daily  . Headache(784.0)    occasionally-d/t congestion  . Heart murmur   . History of bronchitis 6-7 yrs ago  . Hyperlipidemia   . Hypertension    takes Metoprolol and Diovan daily  . Hypothyroidism 12/06/2011   takes Synthroid daily  . Joint pain   . Multiple allergies    takes Zyrtec daily;uses Nasonex daily as needed  . Nocturia   . Numbness    weakness-right hand  . OSA on CPAP   . Pancreatitis   . Peripheral edema    takes Furosemide daily  . Shingles   . Urinary frequency   . Venous insufficiency of leg     Past Surgical History:  Procedure Laterality Date  . ABDOMINAL HYSTERECTOMY     uterine prolapse  . BACK SURGERY     fusion  . CARDIAC SURGERY    . CHOLECYSTECTOMY    . COLONOSCOPY    .  ESOPHAGOGASTRODUODENOSCOPY  5-54yrs ago  . FOOT SURGERY Bilateral   . KNEE ARTHROSCOPY    . LAPAROSCOPIC GASTRIC SLEEVE RESECTION N/A 08/23/2016   Procedure: LAPAROSCOPIC GASTRIC SLEEVE RESECTION WITH UPPER ENDO;  Surgeon: Johnathan Hausen, MD;  Location: [facility];  Service: General;  Laterality: N/A;  . SHOULDER ARTHROSCOPY WITH ROTATOR CUFF REPAIR AND SUBACROMIAL DECOMPRESSION Right 08/23/2013   Procedure: RIGHT SHOULDER ARTHROSCOPY WITH  SUBACROMIAL DECOMPRESSION DISTAL CLAVICLE RESECTION AND  ROTATOR CUFF REPAIR ;  Surgeon: Marin Shutter, MD;  Location: [facility];  Service: Orthopedics;  Laterality: Right;  . TUBAL LIGATION      There were no vitals filed for this visit.  Subjective Assessment - 06/23/18 0845    Subjective  "I am doing better, the pain is 2/10"    Currently in Pain?  Yes    Pain Score  2     Pain Orientation  Right                       [facility] Adult PT Treatment/Exercise - 06/23/18 0001      Knee/Hip Exercises: Aerobic   Nustep  L6 x 7  min LE only      Knee/Hip Exercises: Standing   Hip Abduction  2 sets;20 reps;Knee straight    Hip Extension  2 sets;20 reps;Knee straight    Forward Step Up  2 sets;10 reps;Both;Step Height: 6"    Step Down  2 sets;10 reps;Step Height: 6";Both      Knee/Hip Exercises: Seated   Long Arc Quad  1 set;10 reps;Both;AROM    Other Seated Knee/Hip Exercises  anterior pelvic tilt 1 x 10    Sit to Sand  1 set;10 reps;without UE support      Ankle Exercises: Seated   Other Seated Ankle Exercises  heel raises 1 x 20       Ankle Exercises: Stretches   Slant Board Stretch  2 reps;30 seconds      Ankle Exercises: Standing   Other Standing Ankle Exercises  arch builder exercise on airex pad 2 x 20    demonstraton for proper form         Balance Exercises - 06/23/18 0907      Balance Exercises: Standing   Standing Eyes Opened  Narrow base of support (BOS);Foam/compliant surface;2 reps;30 secs    Standing Eyes Closed   Narrow base of support (BOS);Foam/compliant surface;2 reps;30 secs        PT Education - 06/23/18 0857    Education Details  updated HEP     Person(s) Educated  Patient    Methods  Explanation;Verbal cues;Handout    Comprehension  Verbalized understanding;Verbal cues required       PT Short Term Goals - 06/07/18 0917      PT SHORT TERM GOAL #1   Title  Pt to be I with initial HEP    Period  Weeks    Status  Achieved      PT SHORT TERM GOAL #2   Title  Pt to verbalize/ demo proper posture and gait mechanics to prevent and reduce foot/ and hip pain    Period  Weeks    Status  Achieved        PT Long Term Goals - 06/07/18 7035      PT LONG TERM GOAL #1   Title  Pt to increase L ankle strength to >/= 4+/5 in all planes to promote ankle stability     Period  Weeks    Status  On-going    Target Date  07/19/18      PT LONG TERM GOAL #2   Title  Pt to be able to walk / stand >/= 60 min and navigate up/down 12 steps reciprocally with </=3/10 pain     Period  Weeks    Status  On-going    Target Date  07/19/18      PT LONG TERM GOAL #3   Title  Increase FOTO limitation to </= 50% limited to demo improvement in function.     Time  6    Period  Weeks    Status  Unable to assess    Target Date  07/19/18      PT LONG TERM GOAL #4   Target Date  07/19/18            Plan - 06/23/18 0093    Clinical Impression Statement  focused session on strengthening working into endurance of bil LE which she did well with. She does fatigue quickly with step up/down. plan to see pt back in 2 weeks to assess progress without PT and if she is doing well  then plan to d/C    PT Treatment/Interventions  ADLs/Self Care Home Management;Cryotherapy;Electrical Stimulation;Iontophoresis 4mg /ml Dexamethasone;Moist Heat;Ultrasound;Therapeutic exercise;Taping;Vasopneumatic Device;Manual techniques;Dry needling;Patient/family education;Therapeutic activities;Passive range of motion    PT Next Visit  Plan  posterior tib STW, foot intrinsics, hip ER and abductor strengthening,  gait training PRN, response to TPDN    PT Home Exercise Plan  4-way ankle strength, towel scrunch, calf stretch (seated and standing ), toe yoga, hip ER in R sidelying, standing hip abduction/ extension, corner balacne    Consulted and Agree with Plan of Care  Patient       Patient will benefit from skilled therapeutic intervention in order to improve the following deficits and impairments:  Pain, Decreased strength, Decreased endurance, Decreased activity tolerance, Postural dysfunction, Improper body mechanics, Decreased range of motion, Abnormal gait, Increased edema, Increased fascial restricitons  Visit Diagnosis: Pain in right ankle and joints of right foot  Other abnormalities of gait and mobility  Localized edema  Muscle weakness (generalized)     Problem List Patient Active Problem List   Diagnosis Date Noted  . Bilateral primary osteoarthritis of knee 12/27/2016  . 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 Prediabetes 12/03/2015  . OSA on CPAP 02/19/2015  . Morbid obesity ([place]) 10/09/2014  . Pulmonary HTN ([place]) 05/12/2013  . Bilateral lower extremity edema 05/12/2013  . Morbid obesity with BMI of 50.0-59.9, adult ([place])   . Back pain, hx of prior back surgery 12/09/2011  . Pancreatitis, mild, CT negative 12/08/2011  . Fatty liver 12/08/2011  . Substernal chest pain, suspected secondary to acute pancreatitis 12/06/2011  . Hypertension 12/06/2011  . Hypokalemia 12/06/2011  . Hypothyroidism 12/06/2011  . Anxiety 12/06/2011  . BV (bacterial vaginosis)   . Hyperlipidemia    Starr Lake PT, DPT, LAT, ATC  06/23/18  9:26 AM      [facility] [address] [place], [place], [address] Phone: [phone]   Fax:  [phone]  Name: Cynia Abruzzo MRN: [id number] Date of Birth: 1960/06/25

## 2018-06-26 ENCOUNTER — Ambulatory Visit: Payer: 59 | Admitting: Physical Medicine and Rehabilitation

## 2018-07-06 ENCOUNTER — Ambulatory Visit (INDEPENDENT_AMBULATORY_CARE_PROVIDER_SITE_OTHER): Payer: 59 | Admitting: Obstetrics and Gynecology

## 2018-07-06 ENCOUNTER — Encounter: Payer: Self-pay | Admitting: Obstetrics and Gynecology

## 2018-07-06 ENCOUNTER — Other Ambulatory Visit: Payer: Self-pay

## 2018-07-06 VITALS — BP 114/82 | HR 88 | Temp 97.5°F | Ht 63.0 in | Wt 263.2 lb

## 2018-07-06 DIAGNOSIS — Z01419 Encounter for gynecological examination (general) (routine) without abnormal findings: Secondary | ICD-10-CM | POA: Diagnosis not present

## 2018-07-06 DIAGNOSIS — Z113 Encounter for screening for infections with a predominantly sexual mode of transmission: Secondary | ICD-10-CM | POA: Diagnosis not present

## 2018-07-06 NOTE — Patient Instructions (Signed)

## 2018-07-06 NOTE — Progress Notes (Signed)
58 y.o. G36P2002 Divorced Black or [place] American Not Hispanic or Latino female here for annual exam.  Wants STD testing. She has a h/o a hysterectomy, still has her ovaries.  Sexually active, 2 partners in the last year.   She has issues with intermittent vulvitis. Uses steroids intermittently. Not currently bothersome.   She is on gabapentin for vasomotor symptoms, mostly controlling them.  She had surgery on her left foot, still sore. Has tendonitis in her other foot. Going to PT    No LMP recorded. Patient has had a hysterectomy.          Sexually active: Yes.    The current method of family planning is status post hysterectomy.    Exercising: Yes.    stretching Smoker:  no  Health Maintenance: Pap:  2015 WNL per patient  History of abnormal Pap:  no MMG:  12/19/2017 Bir[facility] 1 negative Colonoscopy:  2013 normal per patient  BMD:   Unsure TDaP:  03-25-13 Gardasil: N/A   reports that she quit smoking about 27 years ago. Her smoking use included cigarettes. She has a 30.00 pack-year smoking history. She has never used smokeless tobacco. She reports current alcohol use. She reports that she does not use drugs. Rare ETOH. Works in [employer]. 2 kids, local. 3 grandchildren  Past Medical History:  Diagnosis Date  . Anemia when she was young  . Arthritis   . Congestion of nasal sinus   . Constipation    takes Colace every other day  . DDD (degenerative disc disease), lumbar   . Diabetes mellitus without complication ([place])    per pt had been diagnosed but then "number started going down so my doctor said i didnt have diabetes"  . Dry eyes    uses Systane Eye drops daily as needed  . GERD (gastroesophageal reflux disease) 12/06/2011   takes Nexium daily  . Headache(784.0)    occasionally-d/t congestion  . Heart murmur   . History of bronchitis 6-7 yrs ago  . Hyperlipidemia   . Hypertension    takes Metoprolol and Diovan daily  . Hypothyroidism 12/06/2011   takes  Synthroid daily  . Joint pain   . Multiple allergies    takes Zyrtec daily;uses Nasonex daily as needed  . Nocturia   . Numbness    weakness-right hand  . OSA on CPAP   . Pancreatitis   . Peripheral edema    takes Furosemide daily  . Shingles   . Urinary frequency   . Venous insufficiency of leg     Past Surgical History:  Procedure Laterality Date  . ABDOMINAL HYSTERECTOMY     uterine prolapse  . BACK SURGERY     fusion  . CARDIAC SURGERY    . CHOLECYSTECTOMY    . COLONOSCOPY    . ESOPHAGOGASTRODUODENOSCOPY  5-85yrs ago  . FOOT SURGERY Bilateral   . KNEE ARTHROSCOPY    . LAPAROSCOPIC GASTRIC SLEEVE RESECTION N/A 08/23/2016   Procedure: LAPAROSCOPIC GASTRIC SLEEVE RESECTION WITH UPPER ENDO;  Surgeon: Johnathan Hausen, MD;  Location: [facility];  Service: General;  Laterality: N/A;  . SHOULDER ARTHROSCOPY WITH ROTATOR CUFF REPAIR AND SUBACROMIAL DECOMPRESSION Right 08/23/2013   Procedure: RIGHT SHOULDER ARTHROSCOPY WITH  SUBACROMIAL DECOMPRESSION DISTAL CLAVICLE RESECTION AND  ROTATOR CUFF REPAIR ;  Surgeon: Marin Shutter, MD;  Location: [facility];  Service: Orthopedics;  Laterality: Right;  . TUBAL LIGATION      Current Outpatient Medications  Medication Sig Dispense Refill  . albuterol (  PROVENTIL HFA;VENTOLIN HFA) 108 (90 BASE) MCG/ACT inhaler Inhale 1-2 puffs into the lungs every 6 (six) hours as needed for wheezing or shortness of breath. 1 Inhaler 2  . aspirin EC 81 MG tablet Take 81 mg by mouth daily.     . Azelastine HCl 137 MCG/SPRAY SOLN   3  . betamethasone valerate ointment (VALISONE) 0.1 % Apply a pea sized amount topically BID for up to 2 weeks. Not for daily long term use. 30 g 0  . Biotin 5000 MCG CAPS Take 5,000 mcg by mouth daily.    . diazepam (VALIUM) 5 MG tablet Take 0.5-1 tablets (2.5-5 mg total) by mouth every 6 (six) hours as needed for muscle spasms or sedation. 40 tablet 1  . docusate sodium (COLACE) 100 MG capsule Take 100 mg by mouth daily.     Marland Kitchen  esomeprazole (NEXIUM) 40 MG capsule Take 40 mg by mouth daily.     . fluticasone (FLONASE) 50 MCG/ACT nasal spray Place 1 spray into both nostrils daily as needed for allergies.   1  . furosemide (LASIX) 20 MG tablet Take 3 tablets (60 mg total) by mouth daily. NEED OV. (Patient taking differently: Take 60 mg by mouth as needed. NEED OV.) 270 tablet 0  . gabapentin (NEURONTIN) 300 MG capsule Take 1 capsule (300 mg total) by mouth 2 (two) times daily. 180 capsule 1  . HYDROmorphone (DILAUDID) 2 MG tablet Take 1 tablet (2 mg total) by mouth every 4 (four) hours as needed for severe pain. 30 tablet 0  . ibuprofen (ADVIL,MOTRIN) 600 MG tablet   0  . levothyroxine (SYNTHROID, LEVOTHROID) 75 MCG tablet Take 75 mcg by mouth daily before breakfast.    . lisinopril (PRINIVIL,ZESTRIL) 20 MG tablet Take 1 tablet (20 mg total) by mouth daily. 90 tablet 3  . loratadine (CLARITIN) 10 MG tablet Take 10 mg by mouth daily.    . magnesium oxide (MAG-OX) 400 MG tablet Take 400 mg by mouth daily.    . meloxicam (MOBIC) 15 MG tablet   3  . metoprolol tartrate (LOPRESSOR) 25 MG tablet Take 25 mg by mouth 2 (two) times daily.    . Omega-3 Fatty Acids (FISH OIL) 1000 MG CAPS Take 1,000 mg by mouth daily.    . ondansetron (ZOFRAN) 4 MG tablet Take 1 tablet (4 mg total) by mouth every 8 (eight) hours as needed for nausea or vomiting. 20 tablet 0  . Polyethyl Glycol-Propyl Glycol (SYSTANE OP) Place 1 drop into both eyes daily as needed (dry eyes).     . vitamin C (ASCORBIC ACID) 500 MG tablet Take 500 mg by mouth daily.    Marland Kitchen VITAMIN D PO Take by mouth.    . potassium chloride SA (KLOR-CON M20) 20 MEQ tablet Take 1 tablet (20 mEq total) by mouth daily. (Patient taking differently: Take 20 mEq by mouth as needed. ) 90 tablet 3   No current facility-administered medications for this visit.     Family History  Problem Relation Age of Onset  . Hypertension Mother   . Hypertension Father   . Hypertension Sister   . Kidney  disease Sister        [facility] TRANSPLANT  . Thyroid disease Sister   . Thyroid disease Sister   . Hypertension Sister   . Breast cancer Maternal Aunt        >50; passed away from it  . Breast cancer Maternal Aunt        >50  Review of Systems  Constitutional: Negative.   HENT: Negative.   Eyes: Negative.   Respiratory: Negative.   Cardiovascular: Negative.   Gastrointestinal: Negative.   Endocrine: Negative.   Genitourinary: Negative.   Musculoskeletal: Negative.   Skin: Negative.   Allergic/Immunologic: Negative.   Neurological: Negative.   Hematological: Negative.   Psychiatric/Behavioral: Negative.     Exam:   BP 114/82 (BP Location: Left Arm, Patient Position: Sitting, Cuff Size: Large)   Pulse 88   Temp (!) 97.5 F (36.4 C) (Skin)   Ht 5\' 3"  (1.6 m)   Wt 263 lb 3.2 oz (119.4 kg)   BMI 46.62 kg/m   Weight change: @WEIGHTCHANGE @ Height:   Height: 5\' 3"  (160 cm)  Ht Readings from Last 3 Encounters:  07/06/18 5\' 3"  (1.6 m)  04/28/18 5\' 3"  (1.6 m)  04/18/18 5' 3.5" (1.613 m)    General appearance: alert, cooperative and appears stated age Head: Normocephalic, without obvious abnormality, atraumatic Neck: no adenopathy, supple, symmetrical, trachea midline and thyroid normal to inspection and palpation Lungs: clear to auscultation bilaterally Cardiovascular: regular rate and rhythm Breasts: normal appearance, no masses or tenderness Abdomen: soft, non-tender; non distended,  no masses,  no organomegaly Extremities: extremities normal, atraumatic, no cyanosis or edema Skin: Skin color, texture, turgor normal. No rashes or lesions Lymph nodes: Cervical, supraclavicular, and axillary nodes normal. No abnormal inguinal nodes palpated Neurologic: Grossly normal   Pelvic: External genitalia:  no lesions, diffuse whitening of the vulva. No lesions, no fissures, no significant agglutination              Urethra:  normal appearing urethra with no masses, tenderness or  lesions              Bartholins and Skenes: normal                 Vagina: normal appearing vagina with normal color and discharge, no lesions              Cervix: absent               Bimanual Exam:  Uterus:  uterus absent              Adnexa: no mass, fullness, tenderness               Rectovaginal: Confirms               Anus:  normal sphincter tone, no lesions  Chaperone was present for exam.  A:  Well Woman with normal exam  Intermittent issues with vulvitis, currently feels okay  Vasomotor symptoms  Multiple medical issues, followed by primary MD  P:   No pap needed  On gabapentin for vasomotor symptoms (primary prescribed it last)  Mammogram and colonoscopy are UTD  Discussed breast self exam  Discussed calcium and vit D intake  Intermittent use of topical steroids, uses Vaseline as needed  Screening STD

## 2018-07-07 ENCOUNTER — Ambulatory Visit: Payer: 59 | Attending: Podiatry | Admitting: Physical Therapy

## 2018-07-07 DIAGNOSIS — M25571 Pain in right ankle and joints of right foot: Secondary | ICD-10-CM | POA: Diagnosis not present

## 2018-07-07 DIAGNOSIS — M6281 Muscle weakness (generalized): Secondary | ICD-10-CM | POA: Insufficient documentation

## 2018-07-07 DIAGNOSIS — R600 Localized edema: Secondary | ICD-10-CM | POA: Diagnosis not present

## 2018-07-07 DIAGNOSIS — R2689 Other abnormalities of gait and mobility: Secondary | ICD-10-CM | POA: Insufficient documentation

## 2018-07-07 LAB — RPR: RPR Ser Ql: NONREACTIVE

## 2018-07-07 LAB — HIV ANTIBODY (ROUTINE TESTING W REFLEX): HIV Screen 4th Generation wRfx: NONREACTIVE

## 2018-07-07 NOTE — Therapy (Signed)
[place], [place], [address] Phone: [phone]   Fax:  [phone]  Physical Therapy Treatment / Discharge summary  Patient Details  Name: [name] MRN: [id number] Date of Birth: [birth year] Referring Provider (PT): Tyson Dense DPM   Encounter Date: 07/07/2018  PT End of Session - 07/07/18 1101    Visit Number  7    Number of Visits  13    Date for PT Re-Evaluation  07/19/18    Authorization Type  [facility] [employer]    PT Start Time  1102    PT Stop Time  1128    PT Time Calculation (min)  26 min    Activity Tolerance  Patient tolerated treatment well    Behavior During Therapy  [facility] for tasks assessed/performed       Past Medical History:  Diagnosis Date  . Anemia when she was young  . Arthritis   . Congestion of nasal sinus   . Constipation    takes Colace every other day  . DDD (degenerative disc disease), lumbar   . Diabetes mellitus without complication ([place])    per pt had been diagnosed but then "number started going down so my doctor said i didnt have diabetes"  . Dry eyes    uses Systane Eye drops daily as needed  . GERD (gastroesophageal reflux disease) 12/06/2011   takes Nexium daily  . Headache(784.0)    occasionally-d/t congestion  . Heart murmur   . History of bronchitis 6-7 yrs ago  . Hyperlipidemia   . Hypertension    takes Metoprolol and Diovan daily  . Hypothyroidism 12/06/2011   takes Synthroid daily  . Joint pain   . Multiple allergies    takes Zyrtec daily;uses Nasonex daily as needed  . Nocturia   . Numbness    weakness-right hand  . OSA on CPAP   . Pancreatitis   . Peripheral edema    takes Furosemide daily  . Shingles   . Urinary frequency   . Venous insufficiency of leg     Past Surgical History:  Procedure Laterality Date  . ABDOMINAL HYSTERECTOMY     uterine prolapse  . BACK SURGERY     fusion  . CARDIAC SURGERY    . CHOLECYSTECTOMY    .  COLONOSCOPY    . ESOPHAGOGASTRODUODENOSCOPY  5-53yr ago  . FOOT SURGERY Bilateral   . KNEE ARTHROSCOPY    . LAPAROSCOPIC GASTRIC SLEEVE RESECTION N/A 08/23/2016   Procedure: LAPAROSCOPIC GASTRIC SLEEVE RESECTION WITH UPPER ENDO;  Surgeon: MJohnathan Hausen MD;  Location: [facility];  Service: General;  Laterality: N/A;  . SHOULDER ARTHROSCOPY WITH ROTATOR CUFF REPAIR AND SUBACROMIAL DECOMPRESSION Right 08/23/2013   Procedure: RIGHT SHOULDER ARTHROSCOPY WITH  SUBACROMIAL DECOMPRESSION DISTAL CLAVICLE RESECTION AND  ROTATOR CUFF REPAIR ;  Surgeon: KMarin Shutter MD;  [place]  Service: Orthopedics;  Laterality: Right;  . TUBAL LIGATION      There were no vitals filed for this visit.  Subjective Assessment - 07/07/18 1104    Subjective  "I am doing pretty good. I have been on it more today so I am little more sore but otherwise I am doing pretty good"    Currently in Pain?  Yes    Pain Score  4     Pain Location  Ankle    Pain Orientation  Right    Pain Descriptors / Indicators  Sore    Pain Type  Chronic pain  [facility] PT Assessment - 07/07/18 0001      Assessment   Medical Diagnosis  R posterior tibialis tendinitis    Referring Provider (PT)  Max Hyatt DPM      Observation/Other Assessments   Focus on Therapeutic Outcomes (FOTO)   58% limitation      AROM   Right Ankle Dorsiflexion  10    Right Ankle Plantar Flexion  50    Right Ankle Inversion  18    Right Ankle Eversion  10      Strength   Right Ankle Dorsiflexion  5/5    Right Ankle Plantar Flexion  5/5    Right Ankle Inversion  4+/5    Right Ankle Eversion  4+/5                   [facility] Adult PT Treatment/Exercise - 07/07/18 0001      Knee/Hip Exercises: Standing   Hip Abduction  1 set;20 reps;Knee straight    Abduction Limitations  with blue theraband    Hip Extension  1 set;20 reps;Knee straight;Stengthening;Both   blue theraband     Ankle Exercises: Seated   Other Seated Ankle Exercises   4- way ankle strength 1 x 20 ea direction    with blue theraband            PT Education - 07/07/18 1106    Education Details  reviewed HEP, answering questions. discussed importance of continued strengthening to promote strengthening/ endurance    Person(s) Educated  Patient    Methods  Explanation;Verbal cues    Comprehension  Verbalized understanding;Verbal cues required       PT Short Term Goals - 06/07/18 0917      PT SHORT TERM GOAL #1   Title  Pt to be I with initial HEP    Period  Weeks    Status  Achieved      PT SHORT TERM GOAL #2   Title  Pt to verbalize/ demo proper posture and gait mechanics to prevent and reduce foot/ and hip pain    Period  Weeks    Status  Achieved        PT Long Term Goals - 07/07/18 1112      PT LONG TERM GOAL #1   Title  Pt to increase L ankle strength to >/= 4+/5 in all planes to promote ankle stability     Period  Weeks    Status  Achieved      PT LONG TERM GOAL #2   Title  Pt to be able to walk / stand >/= 60 min and navigate up/down 12 steps reciprocally with </=3/10 pain     Period  Weeks    Status  Achieved      PT LONG TERM GOAL #3   Title  Increase FOTO limitation to </= 50% limited to demo improvement in function.     Time  6    Period  Weeks    Status  Partially Met      PT LONG TERM GOAL #4   Title  Pt to be I with all HEP given as of last visit to maintain and progress current level of function.     Period  Weeks    Status  Achieved            Plan - 07/07/18 1128    Clinical Impression Statement  pt reported doing well with her exericses but does report soreness mostly with prolonged  walking/ standing likely related to fatigue / challenges with endurance. Following ankle / hip strengthening she noted pain reduced from 4/10 to 1-2/10. She has improved her ROM and strength compared to previous measures. she met or partially met all goals today. She is able to maintain and progress current level of function  independenlty and will be discharged from PT today.    PT Treatment/Interventions  ADLs/Self Care Home Management;Cryotherapy;Electrical Stimulation;Iontophoresis 40m/ml Dexamethasone;Moist Heat;Ultrasound;Therapeutic exercise;Taping;Vasopneumatic Device;Manual techniques;Dry needling;Patient/family education;Therapeutic activities;Passive range of motion    PT Next Visit Plan  D/C    PT Home Exercise Plan  4-way ankle strength, towel scrunch, calf stretch (seated and standing ), toe yoga, hip ER in R sidelying, standing hip abduction/ extension, corner balacne    Consulted and Agree with Plan of Care  Patient       Patient will benefit from skilled therapeutic intervention in order to improve the following deficits and impairments:  Pain, Decreased strength, Decreased endurance, Decreased activity tolerance, Postural dysfunction, Improper body mechanics, Decreased range of motion, Abnormal gait, Increased edema, Increased fascial restricitons  Visit Diagnosis: Pain in right ankle and joints of right foot  Other abnormalities of gait and mobility  Localized edema  Muscle weakness (generalized)     Problem List Patient Active Problem List   Diagnosis Date Noted  . Bilateral primary osteoarthritis of knee 12/27/2016  . 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 Prediabetes 12/03/2015  . OSA on CPAP 02/19/2015  . Morbid obesity ([place] 10/09/2014  . Pulmonary HTN ([place] 05/12/2013  . Bilateral lower extremity edema 05/12/2013  . Morbid obesity with BMI of 50.0-59.9, adult ([place]   . Back pain, hx of prior back surgery 12/09/2011  . Pancreatitis, mild, CT negative 12/08/2011  . Fatty liver 12/08/2011  . Substernal chest pain, suspected secondary to acute pancreatitis 12/06/2011  . Hypertension 12/06/2011  . Hypokalemia 12/06/2011  . Hypothyroidism 12/06/2011  . Anxiety 12/06/2011  . BV (bacterial vaginosis)   . Hyperlipidemia     LStarr Lake6/12/2018,  11:31 AM  [facility] [address]Phone: [phone]  Fax:  [phone] Name: CBria SparrMRN: [id number] of Birth: 1May 25, 1962    PHYSICAL THERAPY DISCHARGE SUMMARY  Visits from Start of Care: 7  Current functional level related to goals / functional outcomes: See goals, FOTO 58% limited   Remaining deficits: Intermittent soreness noted with prolonged walking/ standing but does report decreased pain with exercises    Education / Equipment: HEP, theraband, posture, balance  Plan: Patient agrees to discharge.  Patient goals were partially met. Patient is being discharged due to meeting the stated rehab goals.  ?????         Nel Stoneking PT, DPT, LAT, ATC  07/07/18  11:34 AM

## 2018-07-10 ENCOUNTER — Ambulatory Visit: Payer: Self-pay

## 2018-07-10 ENCOUNTER — Encounter: Payer: Self-pay | Admitting: Physical Medicine and Rehabilitation

## 2018-07-10 ENCOUNTER — Ambulatory Visit (INDEPENDENT_AMBULATORY_CARE_PROVIDER_SITE_OTHER): Payer: 59 | Admitting: Physical Medicine and Rehabilitation

## 2018-07-10 ENCOUNTER — Other Ambulatory Visit: Payer: Self-pay

## 2018-07-10 DIAGNOSIS — G894 Chronic pain syndrome: Secondary | ICD-10-CM

## 2018-07-10 DIAGNOSIS — M461 Sacroiliitis, not elsewhere classified: Secondary | ICD-10-CM | POA: Diagnosis not present

## 2018-07-10 DIAGNOSIS — M961 Postlaminectomy syndrome, not elsewhere classified: Secondary | ICD-10-CM

## 2018-07-10 NOTE — Progress Notes (Signed)
   [name] - 58 y.o. female MRN [id number]  Date of birth: 03-06-1960  Office Visit Note: Visit Date: 07/10/2018 PCP: Velna Hatchet, MD Referred by: Velna Hatchet, MD  Subjective: No chief complaint on file.  HPI:  [name] is a 58 y.o. female who comes in today For planned repeat bilateral sacroiliac joint injections.  She is getting 3 to 4 months relief with the sacroiliac joint injections.  She has had prior lumbar fusion with increased lordosis but not fused to the sacrum.  Even despite that she has had degenerative changes of both sacroiliac joints.  She has been reviewed and evaluated by rheumatology not really found to have much in the way of rheumatologic disease.  This is likely arthritic changes to the joints because of the fusion and her weight.  She unfortunately is morbidly obese.  She has had to switch from Lyrica to gabapentin but the gabapentin seems to be helping fairly well.  We have been trying to increase the dose and monitor that.  She did get good relief with the ablation.  She might be a candidate for prolotherapy of the SI joints versus radiofrequency ablation.  ROS Otherwise per HPI.  Assessment & Plan: Visit Diagnoses:  1. Sacroiliitis ([place])     Plan: No additional findings.   Meds & Orders: No orders of the defined types were placed in this encounter.   Orders Placed This Encounter  Procedures  . Sacroiliac Joint Inj  . XR C-ARM NO REPORT    Follow-up: No follow-ups on file.   Procedures: Sacroiliac Joint Inj on 07/10/2018 1:01 PM Indications: pain and diagnostic evaluation Details: 22 G 3.5 in needle, fluoroscopy-guided posterior approach Medications (Right): 2 mL bupivacaine 0.5 %; 60 mg triamcinolone acetonide 40 MG/ML Medications (Left): 2 mL bupivacaine 0.5 %; 60 mg triamcinolone acetonide 40 MG/ML Outcome: tolerated well, no immediate complications  There was excellent flow of contrast producing a partial arthrogram  of the sacroiliac joint.  Procedure, treatment alternatives, risks and benefits explained, specific risks discussed. Consent was given by the patient. Immediately prior to procedure a time out was called to verify the correct patient, procedure, equipment, support staff and site/side marked as required. Patient was prepped and draped in the usual sterile fashion.      No notes on file   Clinical History: Lumbar spine MRI dated 06/11/2014 IMPRESSION: Interval fusion L3-4 L4-5. Posterior decompression at these levels without stenosis.  Progressive facet degeneration and mild spinal stenosis at L2-3.  Progressive facet degeneration on the left at L5-S1 with left foraminal encroachment.      Objective:  VS:  HT:    WT:   BMI:     BP:   HR: bpm  TEMP: ( )  RESP:  Physical Exam Musculoskeletal:     Comments: Patient has positive Fortin finger sign bilaterally and pain with external rotation of the hips.  No distal weakness     Ortho Exam Imaging: No results found.

## 2018-07-10 NOTE — Progress Notes (Signed)
 .  Numeric Pain Rating Scale and Functional Assessment Average Pain 9   In the last MONTH (on 0-10 scale) has pain interfered with the following?  1. General activity like being  able to carry out your everyday physical activities such as walking, climbing stairs, carrying groceries, or moving a chair?  Rating(8)    -Dye Allergies.

## 2018-07-12 LAB — CHLAMYDIA/GONOCOCCUS/TRICHOMONAS, NAA
Chlamydia by NAA: NEGATIVE
Gonococcus by NAA: NEGATIVE
Trich vag by NAA: NEGATIVE

## 2018-07-17 DIAGNOSIS — G4733 Obstructive sleep apnea (adult) (pediatric): Secondary | ICD-10-CM | POA: Diagnosis not present

## 2018-07-25 ENCOUNTER — Ambulatory Visit: Payer: 59 | Admitting: Podiatry

## 2018-07-25 ENCOUNTER — Encounter: Payer: Self-pay | Admitting: Podiatry

## 2018-07-25 ENCOUNTER — Other Ambulatory Visit: Payer: Self-pay

## 2018-07-25 VITALS — Temp 97.2°F

## 2018-07-25 DIAGNOSIS — M76821 Posterior tibial tendinitis, right leg: Secondary | ICD-10-CM

## 2018-07-25 NOTE — Progress Notes (Signed)
She presents today for follow-up of posterior tibial tendon and states that she has pain in her left ankle.  She states that the right posterior tibial tendon is doing so much better she has no pain over there whatsoever and has started to walk much better.  She is also stating that she has pain in the anterior medial aspect of the left ankle as she points to it.  States that seems to be most troublesome after being on her feet for a while.  Objective: Vital signs are stable she alert and oriented x3.  Pulses are palpable.  Right foot is nontender and does not demonstrate any fluctuance within the tendon sheath.  Pulses remain strong and palpable in this area.  Left foot demonstrates tenderness on palpation anteromedial gutter no crepitation on range of motion.  Subtalar joint is full and she has no tenderness on palpation of sinus tarsi.  Assessment: Ankle joint capsulitis left.  Well-healing plantar fasciitis and posterior tibial tendinitis right.  Plan: Injected the left ankle today with 20 mg Kenalog 5 mg Marcaine after Betadine skin prep.  Tolerated procedure without complications follow-up with her in 1 month

## 2018-07-31 ENCOUNTER — Other Ambulatory Visit: Payer: Self-pay | Admitting: Cardiovascular Disease

## 2018-08-25 MED ORDER — BUPIVACAINE HCL 0.5 % IJ SOLN
2.0000 mL | INTRAMUSCULAR | Status: AC | PRN
  Administered 2018-07-10: 2 mL via INTRA_ARTICULAR

## 2018-08-25 MED ORDER — TRIAMCINOLONE ACETONIDE 40 MG/ML IJ SUSP
60.0000 mg | INTRAMUSCULAR | Status: AC | PRN
  Administered 2018-07-10: 60 mg via INTRA_ARTICULAR

## 2018-08-25 MED ORDER — TRIAMCINOLONE ACETONIDE 40 MG/ML IJ SUSP
60.0000 mg | INTRAMUSCULAR | Status: AC | PRN
  Administered 2018-07-10: 13:00:00 60 mg via INTRA_ARTICULAR

## 2018-08-28 ENCOUNTER — Telehealth: Payer: Self-pay | Admitting: Physical Medicine and Rehabilitation

## 2018-08-28 NOTE — Telephone Encounter (Signed)
If last beneficial then ok

## 2018-08-29 NOTE — Telephone Encounter (Signed)
Scheduled for 8/12

## 2018-09-06 ENCOUNTER — Ambulatory Visit: Payer: Self-pay

## 2018-09-06 ENCOUNTER — Encounter: Payer: Self-pay | Admitting: Physical Medicine and Rehabilitation

## 2018-09-06 ENCOUNTER — Other Ambulatory Visit: Payer: Self-pay

## 2018-09-06 ENCOUNTER — Ambulatory Visit (INDEPENDENT_AMBULATORY_CARE_PROVIDER_SITE_OTHER): Payer: 59 | Admitting: Physical Medicine and Rehabilitation

## 2018-09-06 DIAGNOSIS — M961 Postlaminectomy syndrome, not elsewhere classified: Secondary | ICD-10-CM

## 2018-09-06 DIAGNOSIS — M461 Sacroiliitis, not elsewhere classified: Secondary | ICD-10-CM

## 2018-09-06 DIAGNOSIS — G894 Chronic pain syndrome: Secondary | ICD-10-CM

## 2018-09-06 NOTE — Progress Notes (Signed)
 .  Numeric Pain Rating Scale and Functional Assessment Average Pain 9   In the last MONTH (on 0-10 scale) has pain interfered with the following?  1. General activity like being  able to carry out your everyday physical activities such as walking, climbing stairs, carrying groceries, or moving a chair?  Rating(8)    -Dye Allergies.

## 2018-09-14 DIAGNOSIS — E039 Hypothyroidism, unspecified: Secondary | ICD-10-CM | POA: Diagnosis not present

## 2018-09-14 DIAGNOSIS — K760 Fatty (change of) liver, not elsewhere classified: Secondary | ICD-10-CM | POA: Diagnosis not present

## 2018-09-14 DIAGNOSIS — K5909 Other constipation: Secondary | ICD-10-CM | POA: Diagnosis not present

## 2018-09-14 DIAGNOSIS — E1169 Type 2 diabetes mellitus with other specified complication: Secondary | ICD-10-CM | POA: Diagnosis not present

## 2018-10-04 DIAGNOSIS — Z23 Encounter for immunization: Secondary | ICD-10-CM | POA: Diagnosis not present

## 2018-10-12 DIAGNOSIS — G4733 Obstructive sleep apnea (adult) (pediatric): Secondary | ICD-10-CM | POA: Diagnosis not present

## 2018-10-16 ENCOUNTER — Other Ambulatory Visit: Payer: Self-pay

## 2018-10-19 ENCOUNTER — Ambulatory Visit: Payer: 59 | Admitting: Adult Health

## 2018-10-19 ENCOUNTER — Ambulatory Visit (INDEPENDENT_AMBULATORY_CARE_PROVIDER_SITE_OTHER): Payer: 59 | Admitting: Family Medicine

## 2018-10-19 ENCOUNTER — Other Ambulatory Visit: Payer: Self-pay

## 2018-10-19 ENCOUNTER — Encounter: Payer: Self-pay | Admitting: Family Medicine

## 2018-10-19 VITALS — BP 123/74 | HR 64 | Temp 97.1°F | Ht 63.0 in | Wt 267.4 lb

## 2018-10-19 DIAGNOSIS — G4733 Obstructive sleep apnea (adult) (pediatric): Secondary | ICD-10-CM

## 2018-10-19 DIAGNOSIS — Z9989 Dependence on other enabling machines and devices: Secondary | ICD-10-CM | POA: Diagnosis not present

## 2018-10-19 NOTE — Progress Notes (Addendum)
PATIENT: [name] DOB: [birth year]  REASON FOR VISIT: follow up HISTORY FROM: patient  Chief Complaint  Patient presents with  . Follow-up    Yearly f/u. Alone. Rm 9. No new concerns at this time.      HISTORY OF PRESENT ILLNESS: Today 10/19/18 [name] is a 58 y.o. female here today for follow up for OSA on CPAP.  He reports that she is doing very well on CPAP therapy.  She recently did switch to a nasal pillow.  She feels that this is much better for her.  She has not noted a leak since changing her mask.  She continues to work on weight loss.  She has lost about 50 pounds in the past 2 years.  Compliance report dated 09/18/2018 through 10/17/2018 reveals that she is using CPAP every night for compliance 100%.  29 of the last 30 days she used CPAP greater than 4 hours for compliance of 97%.  Average usage was 7 hours and 51 minutes.  AHI was 0.6 on 9 cm of water and an EPR of 3.  There was a leak noted in the 95th percentile of 37.0.  HISTORY: (copied from Dr Guadelupe Sabin note on 10/06/2017)  [name] is a 58 year old right-handed woman with an underlying medical history of hypertension, hyperlipidemia, diabetes, arthritis, hypothyroidism, reflux disease, history of pancreatitis, deemed secondary to medication, degenerative spine disease with low back pain, lower extremity edema and severe obesity, who presents for follow-up consultation of her sleep apnea, well established on CPAP therapy. The patient is unaccompanied today. I last saw her on she reports doing well with CPAP. She had undergone laparoscopic gastric sleeve bariatric surgery on 08/23/2016. She had lost weight. We talked about potentially putting her back in for sleep study to reevaluate after weight loss.  Today, 10/06/2017: I reviewed her CPAP compliance data from 09/05/2017 through 10/04/2017 which is a total of 30 days, during which time she used her CPAP every night with percent used days greater  than 4 hours at 97%, indicating excellent compliance with an average usage of 7 hours and 10 minutes, residual AHI 0.6 per hour, leak on the higher side for the 95th percentile at 26.3 mL/m on a pressure of 9 cm with EPR of 3. She reports doing well with her CPAP, up to date with her supplies typically. Using nasal pillows. Weight has plateaued a little bit. She has had recent issues with blurry vision and watery eyes, wonders if it's the Lyrica. She is going to talk to Dr. Ernestina Patches about this. Her GYN started her on low-dose gabapentin for night sweats. She has intermittent lower extremity edema and uses Lasix as needed. She may be due for an eye examination, goes to [facility] care.  The patient's allergies, current medications, family history, past medical history, past social history, past surgical history and problem list were reviewed and updated as appropriate.  Previously (copied from previous notes for reference):   I saw her on 08/18/2015, at which time she was doing rather well on CPAP therapy and was fully compliant with it. She had undergone epidural steroid injections in the past for her back pain which were helpful.  I reviewed her CPAP compliance data from 09/05/2016 through 10/04/2016, which is a total of 30 days, during which time she used her CPAP every night with percent used days greater than 4 hours at 100%, indicating superb compliance with an average usage of 7 hours and 43 minutes, residual  AHI low at 0.7 per hour, leak acceptable with the 95th percentile at 8.5 L/m on a pressure of 9 cm with EPR of 3.   I first met her on 12/04/2014 at the request of her primary care physician, at which time she reported a family history of OSA, snoring and excessive daytime somnolence. I invited her back for sleep study. She had a split-night sleep study on 01/03/2015. I went over her test results with her in detail today. Baseline sleep efficiency was 68.7% with a latency to sleep of 18  minutes and wake after sleep onset of 45.5 minutes with severe sleep fragmentation noted. She had arousal index of 9 per hour. She had an increased percentage of stage 1 sleep, an increased percentage of slow-wave sleep and REM sleep of 3.6% with a prolonged REM latency. She had no significant PLMS, EKG or EEG changes. She had moderate to loud snoring. Total AHI was 30.1 per hour, average oxygen saturation 91%, nadir was 64%. She was then titrated on CPAP. Sleep efficiency was 72.4% during the second part of the study with sleep latency of 17.5 minutes and wake after sleep onset of 60 minutes with moderate sleep fragmentation noted. She had slow-wave sleep at 22.1%, REM sleep was 35.6%. Average oxygen saturation was 93%, nadir was 86%. CPAP was titrated from 5 cm to 9 cm. AHI was 1 per hour on the final pressure with supine REM sleep achieved. Based on her test results are prescribed CPAP therapy for home use at a pressure of 9 cm.   In the interim, she was seen by Cecille Rubin on 02/19/2015, at which time she was fully compliant with CPAP therapy and doing well.   I reviewed her CPAP compliance data from 07/15/2015 through 08/13/2015 which is a total of 30 days during which time she used her machine every night with percent used days greater than 4 hours at 100%, indicating superb compliance with an average usage of 5 hours and 37 minutes, residual AHI 0.5 per hour, leak low with the 95th percentile at 3.4 L/m on a pressure of 9 cm with EPR of 3.   12/04/2014: She reports snoring and excessive daytime somnolence. I reviewed your office note from 11/12/2014, which you kindly included.   She went to the ER on 11/09/14, after she woke up in the middle of the night a couple with SOB and a sense of choking. She has a family history of obstructive sleep apnea in her older sister who uses a CPAP machine. During the emergency room visit she was noted to have more lower extremity swelling. She was given IV  Lasix 1 and her maintenance Lasix was increased to 30 mg from 20 mg daily. She had a chest x-ray and then a CT angiogram of her chest which showed: 1. No acute cardiopulmonary disease. Specifically, no evidence of pulmonary embolism to the level of the bilateral subsegmental pulmonary arteries.  2. Borderline cardiomegaly. 3. Coronary artery calcifications. 4. Indeterminate punctate (approximately 2 mm) right middle lobe pulmonary nodule. If the patient is at high risk for bronchogenic carcinoma, follow-up chest CT at 1 year is recommended. If the patient is at low risk, no follow-up is needed. She reports a bedtime of around 10 PM. She falls asleep quickly. She is separated. Currently her 71 year old son lives with her but he is moving out. She has a 53 year old daughter who is on her own. 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 Her rise time  is 6:30 AM. She does not wake up rested. Her Epworth sleepiness score is 11 out of 24 today, her fatigue score is 37 out of 63. She has restless leg symptoms. These are intermittent. She has occasional leg cramps as well. She has nocturia, usually once or twice per night. She does not drink caffeine daily. She quit smoking in 1992. She drinks alcohol very rarely.    REVIEW OF SYSTEMS: Out of a complete 14 system review of symptoms, the patient complains only of the following symptoms, constipation, and all other reviewed systems are negative.  Epworth sleepiness scale: 6  ALLERGIES: Allergies  Allergen Reactions  . Metronidazole     Developed pancreatitis after taking this  . Adhesive [Tape] Itching and Rash  . Latex Itching and Rash  . Penicillins Rash    Has patient had a PCN reaction causing immediate rash, facial/tongue/throat swelling, SOB or lightheadedness with hypotension: No Has patient had a PCN reaction causing severe rash involving mucus membranes or skin necrosis: No Has patient had a PCN reaction that required hospitalization No  Has patient had a PCN reaction occurring within the last 10 years: No If all of the above answers are "NO", then may proceed with Cephalosporin use.  Marland Kitchen Vesicare [Solifenacin] Rash    HOME MEDICATIONS: Outpatient Medications Prior to Visit  Medication Sig Dispense Refill  . albuterol (PROVENTIL HFA;VENTOLIN HFA) 108 (90 BASE) MCG/ACT inhaler Inhale 1-2 puffs into the lungs every 6 (six) hours as needed for wheezing or shortness of breath. 1 Inhaler 2  . aspirin EC 81 MG tablet Take 81 mg by mouth daily.     . Azelastine HCl 137 MCG/SPRAY SOLN   3  . betamethasone valerate ointment (VALISONE) 0.1 % Apply a pea sized amount topically BID for up to 2 weeks. Not for daily long term use. 30 g 0  . Biotin 5000 MCG CAPS Take 5,000 mcg by mouth daily.    . diazepam (VALIUM) 5 MG tablet Take 0.5-1 tablets (2.5-5 mg total) by mouth every 6 (six) hours as needed for muscle spasms or sedation. 40 tablet 1  . docusate sodium (COLACE) 100 MG capsule Take 100 mg by mouth daily.     Marland Kitchen esomeprazole (NEXIUM) 40 MG capsule Take 40 mg by mouth daily.     . fluticasone (FLONASE) 50 MCG/ACT nasal spray Place 1 spray into both nostrils daily as needed for allergies.   1  . furosemide (LASIX) 20 MG tablet Take 3 tablets (60 mg total) by mouth daily. NEED OV. (Patient taking differently: Take 60 mg by mouth as needed. NEED OV.) 270 tablet 0  . gabapentin (NEURONTIN) 300 MG capsule Take 1 capsule (300 mg total) by mouth 2 (two) times daily. 180 capsule 1  . ibuprofen (ADVIL,MOTRIN) 600 MG tablet   0  . levothyroxine (SYNTHROID, LEVOTHROID) 75 MCG tablet Take 75 mcg by mouth daily before breakfast.    . lisinopril (PRINIVIL,ZESTRIL) 20 MG tablet Take 1 tablet (20 mg total) by mouth daily. 90 tablet 3  . loratadine (CLARITIN) 10 MG tablet Take 10 mg by mouth daily.    . magnesium oxide (MAG-OX) 400 MG tablet Take 400 mg by mouth daily.    . meloxicam (MOBIC) 15 MG tablet   3  . metoprolol tartrate (LOPRESSOR) 25 MG  tablet Take 25 mg by mouth 2 (two) times daily.    . Omega-3 Fatty Acids (FISH OIL) 1000 MG CAPS Take 1,000 mg by mouth daily.    Marland Kitchen  ondansetron (ZOFRAN) 4 MG tablet Take 1 tablet (4 mg total) by mouth every 8 (eight) hours as needed for nausea or vomiting. 20 tablet 0  . Polyethyl Glycol-Propyl Glycol (SYSTANE OP) Place 1 drop into both eyes daily as needed (dry eyes).     . potassium chloride SA (K-DUR) 20 MEQ tablet TAKE 1 TABLET (20 MEQ TOTAL) BY MOUTH DAILY. 90 tablet 1  . vitamin C (ASCORBIC ACID) 500 MG tablet Take 500 mg by mouth daily.    Marland Kitchen VITAMIN D PO Take by mouth.     No facility-administered medications prior to visit.     PAST MEDICAL HISTORY: Past Medical History:  Diagnosis Date  . Anemia when she was young  . Arthritis   . Congestion of nasal sinus   . Constipation    takes Colace every other day  . DDD (degenerative disc disease), lumbar   . Diabetes mellitus without complication ([place])    per pt had been diagnosed but then "number started going down so my doctor said i didnt have diabetes"  . Dry eyes    uses Systane Eye drops daily as needed  . GERD (gastroesophageal reflux disease) 12/06/2011   takes Nexium daily  . Headache(784.0)    occasionally-d/t congestion  . Heart murmur   . History of bronchitis 6-7 yrs ago  . Hyperlipidemia   . Hypertension    takes Metoprolol and Diovan daily  . Hypothyroidism 12/06/2011   takes Synthroid daily  . Joint pain   . Multiple allergies    takes Zyrtec daily;uses Nasonex daily as needed  . Nocturia   . Numbness    weakness-right hand  . OSA on CPAP   . Pancreatitis   . Peripheral edema    takes Furosemide daily  . Shingles   . Urinary frequency   . Venous insufficiency of leg     PAST SURGICAL HISTORY: Past Surgical History:  Procedure Laterality Date  . ABDOMINAL HYSTERECTOMY     uterine prolapse  . BACK SURGERY     fusion  . CARDIAC SURGERY    . CHOLECYSTECTOMY    . COLONOSCOPY    .  ESOPHAGOGASTRODUODENOSCOPY  5-11yr ago  . FOOT SURGERY Bilateral   . KNEE ARTHROSCOPY    . LAPAROSCOPIC GASTRIC SLEEVE RESECTION N/A 08/23/2016   Procedure: LAPAROSCOPIC GASTRIC SLEEVE RESECTION WITH UPPER ENDO;  Surgeon: MJohnathan Hausen MD;  Location: [facility];  Service: General;  Laterality: N/A;  . SHOULDER ARTHROSCOPY WITH ROTATOR CUFF REPAIR AND SUBACROMIAL DECOMPRESSION Right 08/23/2013   Procedure: RIGHT SHOULDER ARTHROSCOPY WITH  SUBACROMIAL DECOMPRESSION DISTAL CLAVICLE RESECTION AND  ROTATOR CUFF REPAIR ;  Surgeon: KMarin Shutter MD;  Location: [facility]  Service: Orthopedics;  Laterality: Right;  . TUBAL LIGATION      FAMILY HISTORY: Family History  Problem Relation Age of Onset  . Hypertension Mother   . Hypertension Father   . Hypertension Sister   . Kidney disease Sister        [facility]  . Thyroid disease Sister   . Thyroid disease Sister   . Hypertension Sister   . Breast cancer Maternal Aunt        >50; passed away from it  . Breast cancer Maternal Aunt        >50    SOCIAL HISTORY: Social History   Socioeconomic History  . Marital status: Divorced    Spouse name: Not on file  . Number of children: 2  . Years of education:  College  . Highest education level: Not on file  Occupational History    Employer: [employer]    Comment: [employer]  Social Needs  . Financial resource strain: Not on file  . Food insecurity    Worry: Not on file    Inability: Not on file  . Transportation needs    Medical: Not on file    Non-medical: Not on file  Tobacco Use  . Smoking status: Former Smoker    Packs/day: 0.75    Years: 40.00    Pack years: 30.00    Types: Cigarettes    Quit date: 1993    Years since quitting: 27.7  . Smokeless tobacco: Never Used  . Tobacco comment: quit smoking in 1993  Substance and Sexual Activity  . Alcohol use: Yes    Alcohol/week: 0.0 standard drinks    Comment: rarely;; mixed drink   . Drug use: No  . Sexual activity: Yes     Partners: Male    Birth control/protection: Surgical    Comment: Hysterectomy  Lifestyle  . Physical activity    Days per week: Not on file    Minutes per session: Not on file  . Stress: Not on file  Relationships  . Social Herbalist on phone: Not on file    Gets together: Not on file    Attends religious service: Not on file    Active member of club or organization: Not on file    Attends meetings of clubs or organizations: Not on file    Relationship status: Not on file  . Intimate partner violence    Fear of current or ex partner: Not on file    Emotionally abused: Not on file    Physically abused: Not on file    Forced sexual activity: Not on file  Other Topics Concern  . Not on file  Social History Narrative   Caffeine: Coffee      PHYSICAL EXAM  Vitals:   10/19/18 1441  BP: 123/74  Pulse: 64  Temp: (!) 97.1 F (36.2 C)  TempSrc: Oral  Weight: 267 lb 6.4 oz (121.3 kg)  Height: 5' 3"  (1.6 m)   Body mass index is 47.37 kg/m.  Generalized: Well developed, in no acute distress  Cardiology: normal rate and rhythm, no murmur noted Respiratory: Clear to auscultation bilaterally Neurological examination  Mentation: Alert oriented to time, place, history taking. Follows all commands speech and language fluent Cranial nerve II-XII: Pupils were equal round reactive to light. Extraocular movements were full, visual field were full on confrontational test. . Motor: The motor testing reveals 5 over 5 strength of all 4 extremities. Good symmetric motor tone is noted throughout.  Gait and station: Gait is normal.    DIAGNOSTIC DATA (LABS, IMAGING, TESTING) - I reviewed patient records, labs, notes, testing and imaging myself where available.  No flowsheet data found.   Lab Results  Component Value Date   WBC 10.0 08/25/2016   HGB 15.0 09/17/2017   HCT 44.0 09/17/2017   MCV 82.9 08/25/2016   PLT 279 08/25/2016      Component Value Date/Time   NA 141  09/17/2017 1331   NA 143 05/12/2017 1349   K 4.4 09/17/2017 1331   CL 102 09/17/2017 1331   CO2 27 05/12/2017 1349   GLUCOSE 79 09/17/2017 1331   BUN 17 09/17/2017 1331   BUN 14 05/12/2017 1349   CREATININE 0.80 09/17/2017 1331   CREATININE 0.78 08/20/2015 0853  CALCIUM 9.3 05/12/2017 1349   PROT 8.2 (H) 08/18/2016 1546   ALBUMIN 4.1 08/18/2016 1546   AST 32 08/18/2016 1546   ALT 23 08/18/2016 1546   ALKPHOS 94 08/18/2016 1546   BILITOT 0.5 08/18/2016 1546   GFRNONAA 76 05/12/2017 1349   GFRNONAA 86 08/20/2015 0853   GFRAA 88 05/12/2017 1349   GFRAA >89 08/20/2015 0853   Lab Results  Component Value Date   CHOL 167 06/19/2013   HDL 60 06/19/2013   LDLCALC 84 06/19/2013   TRIG 115 06/19/2013   CHOLHDL 2.8 06/19/2013   No results found for: HGBA1C No results found for: VITAMINB12 Lab Results  Component Value Date   TSH 1.891 10/14/2014       ASSESSMENT AND PLAN 58 y.o. year old female  has a past medical history of Anemia (when she was young), Arthritis, Congestion of nasal sinus, Constipation, DDD (degenerative disc disease), lumbar, Diabetes mellitus without complication ([place]), Dry eyes, GERD (gastroesophageal reflux disease) (12/06/2011), Headache(784.0), Heart murmur, History of bronchitis (6-7 yrs ago), Hyperlipidemia, Hypertension, Hypothyroidism (12/06/2011), Joint pain, Multiple allergies, Nocturia, Numbness, OSA on CPAP, Pancreatitis, Peripheral edema, Shingles, Urinary frequency, and Venous insufficiency of leg. here with     ICD-10-CM   1. OSA on CPAP  G47.33    Z99.89      Ratasha is doing well on CPAP therapy.  Compliance report reveals excellent compliance.  There was some concern of a leak.  She has recently changed to nasal pillow and feels that she is getting a more secure fit.  She was advised to ensure that headgear straps are snug and mask is well fitting.  She should call for any continued concerns of leak.  She was encouraged to continue using CPAP  therapy nightly and for greater than 4 hours each night.  Risk of untreated sleep apnea discussed.  No orders of the defined types were placed in this encounter.    No orders of the defined types were placed in this encounter.     I spent 15 minutes with the patient. 50% of this time was spent counseling and educating patient on plan of care and medications.    Debbora Presto, FNP-C 10/19/2018, 2:48 PM [facility] [address], [place], [place] [address] [phone]  I reviewed the above note and documentation by the Nurse Practitioner and agree with the history, exam, assessment and plan as outlined above. I was immediately available for consultation. [name], MD, PhD [facility])

## 2018-10-19 NOTE — Patient Instructions (Signed)
Continue CPAP usage nightly and for greater than 4 hours each night.  I suggest making sure that straps are good and tight and mask is fitting well to ensure there is no significant leak.  Please call if concerns of leak continue.  Follow up in 1 year, sooner if needed   Sleep Apnea Sleep apnea affects breathing during sleep. It causes breathing to stop for a short time or to become shallow. It can also increase the risk of:  Heart attack.  Stroke.  Being very overweight (obese).  Diabetes.  Heart failure.  Irregular heartbeat. The goal of treatment is to help you breathe normally again. What are the causes? There are three kinds of sleep apnea:  Obstructive sleep apnea. This is caused by a blocked or collapsed airway.  Central sleep apnea. This happens when the brain does not send the right signals to the muscles that control breathing.  Mixed sleep apnea. This is a combination of obstructive and central sleep apnea. The most common cause of this condition is a collapsed or blocked airway. This can happen if:  Your throat muscles are too relaxed.  Your tongue and tonsils are too large.  You are overweight.  Your airway is too small. What increases the risk?  Being overweight.  Smoking.  Having a small airway.  Being older.  Being female.  Drinking alcohol.  Taking medicines to calm yourself (sedatives or tranquilizers).  Having family members with the condition. What are the signs or symptoms?  Trouble staying asleep.  Being sleepy or tired during the day.  Getting angry a lot.  Loud snoring.  Headaches in the morning.  Not being able to focus your mind (concentrate).  Forgetting things.  Less interest in sex.  Mood swings.  Personality changes.  Feelings of sadness (depression).  Waking up a lot during the night to pee (urinate).  Dry mouth.  Sore throat. How is this diagnosed?  Your medical history.  A physical exam.  A test  that is done when you are sleeping (sleep study). The test is most often done in a sleep lab but may also be done at home. How is this treated?   Sleeping on your side.  Using a medicine to get rid of mucus in your nose (decongestant).  Avoiding the use of alcohol, medicines to help you relax, or certain pain medicines (narcotics).  Losing weight, if needed.  Changing your diet.  Not smoking.  Using a machine to open your airway while you sleep, such as: ? An oral appliance. This is a mouthpiece that shifts your lower jaw forward. ? A CPAP device. This device blows air through a mask when you breathe out (exhale). ? An EPAP device. This has valves that you put in each nostril. ? A BPAP device. This device blows air through a mask when you breathe in (inhale) and breathe out.  Having surgery if other treatments do not work. It is important to get treatment for sleep apnea. Without treatment, it can lead to:  High blood pressure.  Coronary artery disease.  In men, not being able to have an erection (impotence).  Reduced thinking ability. Follow these instructions at home: Lifestyle  Make changes that your doctor recommends.  Eat a healthy diet.  Lose weight if needed.  Avoid alcohol, medicines to help you relax, and some pain medicines.  Do not use any products that contain nicotine or tobacco, such as cigarettes, e-cigarettes, and chewing tobacco. If you need help quitting,  ask your doctor. General instructions  Take over-the-counter and prescription medicines only as told by your doctor.  If you were given a machine to use while you sleep, use it only as told by your doctor.  If you are having surgery, make sure to tell your doctor you have sleep apnea. You may need to bring your device with you.  Keep all follow-up visits as told by your doctor. This is important. Contact a doctor if:  The machine that you were given to use during sleep bothers you or does not  seem to be working.  You do not get better.  You get worse. Get help right away if:  Your chest hurts.  You have trouble breathing in enough air.  You have an uncomfortable feeling in your back, arms, or stomach.  You have trouble talking.  One side of your body feels weak.  A part of your face is hanging down. These symptoms may be an emergency. Do not wait to see if the symptoms will go away. Get medical help right away. Call your local emergency services (911 in the [place] Do not drive yourself to the hospital. Summary  This condition affects breathing during sleep.  The most common cause is a collapsed or blocked airway.  The goal of treatment is to help you breathe normally while you sleep. This information is not intended to replace advice given to you by your health care provider. Make sure you discuss any questions you have with your health care provider. Document Released: 10/21/2007 Document Revised: 10/28/2017 Document Reviewed: 09/06/2017 Elsevier Patient Education  2020 [employer].

## 2018-10-23 DIAGNOSIS — M461 Sacroiliitis, not elsewhere classified: Secondary | ICD-10-CM | POA: Diagnosis not present

## 2018-10-23 MED ORDER — BUPIVACAINE HCL 0.5 % IJ SOLN
2.0000 mL | INTRAMUSCULAR | Status: AC | PRN
  Administered 2018-10-23: 05:00:00 2 mL via INTRA_ARTICULAR

## 2018-10-23 MED ORDER — METHYLPREDNISOLONE ACETATE 80 MG/ML IJ SUSP
80.0000 mg | INTRAMUSCULAR | Status: AC | PRN
  Administered 2018-10-23: 80 mg via INTRA_ARTICULAR

## 2018-10-23 NOTE — Progress Notes (Signed)
   [name] - 59 y.o. female MRN [id number]  Date of birth: [birth year]  Office Visit Note: Visit Date: 09/06/2018 PCP: Velna Hatchet, MD Referred by: Velna Hatchet, MD  Subjective: No chief complaint on file.  HPI:  [name] is a 58 y.o. female who comes in today For planned diagnostic and therapeutic bilateral sacroiliac joint injections.  She actually gets decent relief with intermittent very infrequent sacroiliac joint injection coupled with medication management.  She has had prior lower lumbar fusion.  She has had a lot of degenerative change in both sacroiliac joints.  She has been followed by rheumatology.  Her case is complicated by morbid obesity.  She has had really no new issues.  She reports pain over the last several weeks despite her normal ability to change activity and she has had physical therapy she continues with home exercises.  She has had no focal weakness she has had no bowel or bladder changes no red flag complaints.  No paresthesias.  ROS Otherwise per HPI.  Assessment & Plan: Visit Diagnoses:  1. Sacroiliitis ([place])   2. Post laminectomy syndrome   3. Chronic pain syndrome     Plan: No additional findings.   Meds & Orders: No orders of the defined types were placed in this encounter.   Orders Placed This Encounter  Procedures  . Sacroiliac Joint Inj  . XR C-ARM NO REPORT    Follow-up: No follow-ups on file.   Procedures: Sacroiliac Joint Inj on 10/23/2018 5:25 AM Indications: pain and diagnostic evaluation Details: 22 G 3.5 in needle, fluoroscopy-guided posterior approach Medications (Right): 80 mg methylPREDNISolone acetate 80 MG/ML; 2 mL bupivacaine 0.5 % Medications (Left): 80 mg methylPREDNISolone acetate 80 MG/ML; 2 mL bupivacaine 0.5 % Outcome: tolerated well, no immediate complications  There was excellent flow of contrast producing a partial arthrogram of the sacroiliac joint.  Procedure, treatment alternatives,  risks and benefits explained, specific risks discussed. Consent was given by the patient. Immediately prior to procedure a time out was called to verify the correct patient, procedure, equipment, support staff and site/side marked as required. Patient was prepped and draped in the usual sterile fashion.      No notes on file   Clinical History: Lumbar spine MRI dated 06/11/2014 IMPRESSION: Interval fusion L3-4 L4-5. Posterior decompression at these levels without stenosis.  Progressive facet degeneration and mild spinal stenosis at L2-3.  Progressive facet degeneration on the left at L5-S1 with left foraminal encroachment.      Objective:  VS:  HT:    WT:   BMI:     BP:   HR: bpm  TEMP: ( )  RESP:  Physical Exam  Ortho Exam Imaging: No results found.

## 2018-11-14 ENCOUNTER — Telehealth: Payer: Self-pay | Admitting: Physical Medicine and Rehabilitation

## 2018-11-14 ENCOUNTER — Other Ambulatory Visit: Payer: Self-pay | Admitting: Internal Medicine

## 2018-11-14 DIAGNOSIS — Z1231 Encounter for screening mammogram for malignant neoplasm of breast: Secondary | ICD-10-CM

## 2018-11-14 DIAGNOSIS — M961 Postlaminectomy syndrome, not elsewhere classified: Secondary | ICD-10-CM

## 2018-11-14 NOTE — Telephone Encounter (Signed)
Yes, use diagnosis for post laminectomy syndrome, lumbar radic - lumbar MRI.

## 2018-11-14 NOTE — Telephone Encounter (Signed)
MRI ordered. Called patient to advise, but no answer and voicemail has not been set up.

## 2018-12-01 ENCOUNTER — Telehealth: Payer: Self-pay | Admitting: Physical Medicine and Rehabilitation

## 2018-12-01 ENCOUNTER — Other Ambulatory Visit: Payer: Self-pay | Admitting: Physical Medicine and Rehabilitation

## 2018-12-01 MED ORDER — METHOCARBAMOL 500 MG PO TABS: 500.0000 mg | ORAL_TABLET | Freq: Three times a day (TID) | ORAL | 0 refills | Status: DC | PRN

## 2018-12-01 NOTE — Telephone Encounter (Signed)
Robaxin(methocarbamal) sent in - if she prefers something else let me know

## 2018-12-01 NOTE — Telephone Encounter (Signed)
Left message to advise

## 2018-12-07 DIAGNOSIS — E1169 Type 2 diabetes mellitus with other specified complication: Secondary | ICD-10-CM | POA: Diagnosis not present

## 2018-12-11 ENCOUNTER — Ambulatory Visit
Admission: RE | Admit: 2018-12-11 | Discharge: 2018-12-11 | Disposition: A | Discharge status: Home / Self Care | Payer: 59 | Source: Ambulatory Visit | Attending: Physical Medicine and Rehabilitation | Admitting: Physical Medicine and Rehabilitation

## 2018-12-11 ENCOUNTER — Other Ambulatory Visit: Payer: Self-pay

## 2018-12-11 DIAGNOSIS — M48061 Spinal stenosis, lumbar region without neurogenic claudication: Secondary | ICD-10-CM | POA: Diagnosis not present

## 2018-12-11 DIAGNOSIS — I10 Essential (primary) hypertension: Secondary | ICD-10-CM | POA: Diagnosis not present

## 2018-12-11 DIAGNOSIS — J309 Allergic rhinitis, unspecified: Secondary | ICD-10-CM | POA: Diagnosis not present

## 2018-12-11 DIAGNOSIS — M961 Postlaminectomy syndrome, not elsewhere classified: Secondary | ICD-10-CM

## 2018-12-11 DIAGNOSIS — Z1331 Encounter for screening for depression: Secondary | ICD-10-CM | POA: Diagnosis not present

## 2018-12-11 DIAGNOSIS — E1169 Type 2 diabetes mellitus with other specified complication: Secondary | ICD-10-CM | POA: Diagnosis not present

## 2018-12-11 DIAGNOSIS — M5126 Other intervertebral disc displacement, lumbar region: Secondary | ICD-10-CM | POA: Diagnosis not present

## 2018-12-11 DIAGNOSIS — M1990 Unspecified osteoarthritis, unspecified site: Secondary | ICD-10-CM | POA: Diagnosis not present

## 2018-12-11 DIAGNOSIS — E785 Hyperlipidemia, unspecified: Secondary | ICD-10-CM | POA: Diagnosis not present

## 2018-12-13 ENCOUNTER — Encounter: Payer: Self-pay | Admitting: Physical Medicine and Rehabilitation

## 2018-12-13 ENCOUNTER — Ambulatory Visit (INDEPENDENT_AMBULATORY_CARE_PROVIDER_SITE_OTHER): Payer: 59 | Admitting: Physical Medicine and Rehabilitation

## 2018-12-13 ENCOUNTER — Other Ambulatory Visit: Payer: Self-pay

## 2018-12-13 VITALS — BP 126/79 | HR 63

## 2018-12-13 DIAGNOSIS — G8929 Other chronic pain: Secondary | ICD-10-CM

## 2018-12-13 DIAGNOSIS — M461 Sacroiliitis, not elsewhere classified: Secondary | ICD-10-CM | POA: Diagnosis not present

## 2018-12-13 DIAGNOSIS — M48061 Spinal stenosis, lumbar region without neurogenic claudication: Secondary | ICD-10-CM

## 2018-12-13 DIAGNOSIS — M961 Postlaminectomy syndrome, not elsewhere classified: Secondary | ICD-10-CM | POA: Diagnosis not present

## 2018-12-13 DIAGNOSIS — M5441 Lumbago with sciatica, right side: Secondary | ICD-10-CM | POA: Diagnosis not present

## 2018-12-13 NOTE — Progress Notes (Signed)
  Numeric Pain Rating Scale and Functional Assessment Average Pain 8   In the last MONTH (on 0-10 scale) has pain interfered with the following?  1. General activity like being  able to carry out your everyday physical activities such as walking, climbing stairs, carrying groceries, or moving a chair?  Rating(9)   .  

## 2018-12-14 MED ORDER — BACLOFEN 10 MG PO TABS: ORAL_TABLET | ORAL | 0 refills | Status: DC

## 2018-12-18 ENCOUNTER — Ambulatory Visit (INDEPENDENT_AMBULATORY_CARE_PROVIDER_SITE_OTHER): Payer: 59 | Admitting: Physical Medicine and Rehabilitation

## 2018-12-18 ENCOUNTER — Ambulatory Visit: Payer: Self-pay

## 2018-12-18 ENCOUNTER — Encounter: Payer: Self-pay | Admitting: Physical Medicine and Rehabilitation

## 2018-12-18 ENCOUNTER — Other Ambulatory Visit: Payer: Self-pay

## 2018-12-18 VITALS — BP 149/93 | HR 63

## 2018-12-18 DIAGNOSIS — M961 Postlaminectomy syndrome, not elsewhere classified: Secondary | ICD-10-CM

## 2018-12-18 DIAGNOSIS — M48062 Spinal stenosis, lumbar region with neurogenic claudication: Secondary | ICD-10-CM

## 2018-12-18 DIAGNOSIS — M5416 Radiculopathy, lumbar region: Secondary | ICD-10-CM

## 2018-12-18 MED ORDER — DEXAMETHASONE SODIUM PHOSPHATE 10 MG/ML IJ SOLN
15.0000 mg | Freq: Once | INTRAMUSCULAR | Status: AC
  Administered 2018-12-18: 15 mg

## 2018-12-18 NOTE — Progress Notes (Signed)
.  Numeric Pain Rating Scale and Functional Assessment Average Pain 7   In the last MONTH (on 0-10 scale) has pain interfered with the following?  1. General activity like being  able to carry out your everyday physical activities such as walking, climbing stairs, carrying groceries, or moving a chair?  Rating(8)   +Driver, -BT, -Dye Allergies.  

## 2018-12-19 ENCOUNTER — Telehealth: Payer: Self-pay | Admitting: Physical Medicine and Rehabilitation

## 2018-12-19 NOTE — Progress Notes (Signed)
[name] - 58 y.o. female MRN [id number]  Date of birth: [birth year]  Office Visit Note: Visit Date: 12/18/2018 PCP: Velna Hatchet, MD Referred by: Velna Hatchet, MD  Subjective: Chief Complaint  Patient presents with  . Lower Back - Pain  . Right Leg - Pain  . Left Leg - Pain   HPI:  [name] is a 58 y.o. female who comes in today For planned right L2 transforaminal epidural steroid injection above her lumbar fusion.  Please see our prior evaluation and management note for further details and justification.  ROS Otherwise per HPI.  Assessment & Plan: Visit Diagnoses:  1. Lumbar radiculopathy   2. Spinal stenosis of lumbar region with neurogenic claudication   3. Post laminectomy syndrome     Plan: No additional findings.   Meds & Orders:  Meds ordered this encounter  Medications  . dexamethasone (DECADRON) injection 15 mg    Orders Placed This Encounter  Procedures  . XR C-ARM NO REPORT  . Epidural Steroid injection    Follow-up: Return if symptoms worsen or fail to improve.   Procedures: No procedures performed  Lumbosacral Transforaminal Epidural Steroid Injection - Sub-Pedicular Approach with Fluoroscopic Guidance  Patient: [name]      Date of Birth: [birth year] MRN: [id number] PCP: Velna Hatchet, MD      Visit Date: 12/18/2018   Universal Protocol:    Date/Time: 12/18/2018  Consent Given By: the patient  Position: PRONE  Additional Comments: Vital signs were monitored before and after the procedure. Patient was prepped and draped in the usual sterile fashion. The correct patient, procedure, and site was verified.   Injection Procedure Details:  Procedure Site One Meds Administered:  Meds ordered this encounter  Medications  . dexamethasone (DECADRON) injection 15 mg    Laterality: Right  Location/Site:  L2-L3  Needle size: 44 G  Needle type: Spinal  Needle Placement: Transforaminal   Findings:    -Comments: Excellent flow of contrast along the nerve and into the epidural space.  Procedure Details: After squaring off the end-plates to get a true AP view, the C-arm was positioned so that an oblique view of the foramen as noted above was visualized. The target area is just inferior to the "nose of the scotty dog" or sub pedicular. The soft tissues overlying this structure were infiltrated with 2-3 ml. of 1% Lidocaine without Epinephrine.  The spinal needle was inserted toward the target using a "trajectory" view along the fluoroscope beam.  Under AP and lateral visualization, the needle was advanced so it did not puncture dura and was located close the 6 O'Clock position of the pedical in AP tracterory. Biplanar projections were used to confirm position. Aspiration was confirmed to be negative for CSF and/or blood. A 1-2 ml. volume of Isovue-250 was injected and flow of contrast was noted at each level. Radiographs were obtained for documentation purposes.   After attaining the desired flow of contrast documented above, a 0.5 to 1.0 ml test dose of 0.25% Marcaine was injected into each respective transforaminal space.  The patient was observed for 90 seconds post injection.  After no sensory deficits were reported, and normal lower extremity motor function was noted,   the above injectate was administered so that equal amounts of the injectate were placed at each foramen (level) into the transforaminal epidural space.   Additional Comments:  The patient tolerated the procedure well Dressing: 2 x 2 sterile gauze and Band-Aid  Post-procedure details: Patient was observed during the procedure. Post-procedure instructions were reviewed.  Patient left the clinic in stable condition.     Clinical History: Lumbar spine MRI dated 06/11/2014 IMPRESSION: Interval fusion L3-4 L4-5. Posterior decompression at these levels without stenosis.  Progressive facet degeneration and  mild spinal stenosis at L2-3.  Progressive facet degeneration on the left at L5-S1 with left foraminal encroachment.      Objective:  VS:  HT:    WT:   BMI:     BP:(!) 149/93  HR:63bpm  TEMP: ( )  RESP:  Physical Exam  Ortho Exam Imaging: Xr C-arm No Report  Result Date: 12/18/2018 Please see Notes tab for imaging impression.

## 2018-12-19 NOTE — Telephone Encounter (Signed)
Called patient to advise  °

## 2018-12-19 NOTE — Telephone Encounter (Signed)
It is the dexamethasone - we use in higher lumbar injections due to safety issues but it is stronger and works faster. The headache and BP will normalize but see PCP is right answer.

## 2018-12-19 NOTE — Procedures (Signed)
Lumbosacral Transforaminal Epidural Steroid Injection - Sub-Pedicular Approach with Fluoroscopic Guidance  Patient: [name]      Date of Birth: [birth year] MRN: [id number] PCP: Velna Hatchet, MD      Visit Date: 12/18/2018   Universal Protocol:    Date/Time: 12/18/2018  Consent Given By: the patient  Position: PRONE  Additional Comments: Vital signs were monitored before and after the procedure. Patient was prepped and draped in the usual sterile fashion. The correct patient, procedure, and site was verified.   Injection Procedure Details:  Procedure Site One Meds Administered:  Meds ordered this encounter  Medications  . dexamethasone (DECADRON) injection 15 mg    Laterality: Right  Location/Site:  L2-L3  Needle size: 55 G  Needle type: Spinal  Needle Placement: Transforaminal  Findings:    -Comments: Excellent flow of contrast along the nerve and into the epidural space.  Procedure Details: After squaring off the end-plates to get a true AP view, the C-arm was positioned so that an oblique view of the foramen as noted above was visualized. The target area is just inferior to the "nose of the scotty dog" or sub pedicular. The soft tissues overlying this structure were infiltrated with 2-3 ml. of 1% Lidocaine without Epinephrine.  The spinal needle was inserted toward the target using a "trajectory" view along the fluoroscope beam.  Under AP and lateral visualization, the needle was advanced so it did not puncture dura and was located close the 6 O'Clock position of the pedical in AP tracterory. Biplanar projections were used to confirm position. Aspiration was confirmed to be negative for CSF and/or blood. A 1-2 ml. volume of Isovue-250 was injected and flow of contrast was noted at each level. Radiographs were obtained for documentation purposes.   After attaining the desired flow of contrast documented above, a 0.5 to 1.0 ml test dose of 0.25%  Marcaine was injected into each respective transforaminal space.  The patient was observed for 90 seconds post injection.  After no sensory deficits were reported, and normal lower extremity motor function was noted,   the above injectate was administered so that equal amounts of the injectate were placed at each foramen (level) into the transforaminal epidural space.   Additional Comments:  The patient tolerated the procedure well Dressing: 2 x 2 sterile gauze and Band-Aid    Post-procedure details: Patient was observed during the procedure. Post-procedure instructions were reviewed.  Patient left the clinic in stable condition.

## 2019-01-01 DIAGNOSIS — Z20818 Contact with and (suspected) exposure to other bacterial communicable diseases: Secondary | ICD-10-CM | POA: Diagnosis not present

## 2019-01-01 DIAGNOSIS — J329 Chronic sinusitis, unspecified: Secondary | ICD-10-CM | POA: Diagnosis not present

## 2019-01-03 ENCOUNTER — Ambulatory Visit
Admission: RE | Admit: 2019-01-03 | Discharge: 2019-01-03 | Disposition: A | Discharge status: Home / Self Care | Payer: 59 | Source: Ambulatory Visit | Attending: Internal Medicine | Admitting: Internal Medicine

## 2019-01-03 ENCOUNTER — Other Ambulatory Visit: Payer: Self-pay

## 2019-01-03 DIAGNOSIS — Z1231 Encounter for screening mammogram for malignant neoplasm of breast: Secondary | ICD-10-CM | POA: Diagnosis not present

## 2019-01-14 IMAGING — MG DIGITAL SCREENING BILATERAL MAMMOGRAM WITH TOMO AND CAD
8 series · 8 of 24 positions shown · non-contrast
Comparison: Previous exam(s).

CLINICAL DATA: Screening.

EXAM:
DIGITAL SCREENING BILATERAL MAMMOGRAM WITH TOMO AND CAD

[L MLO synth-2D]
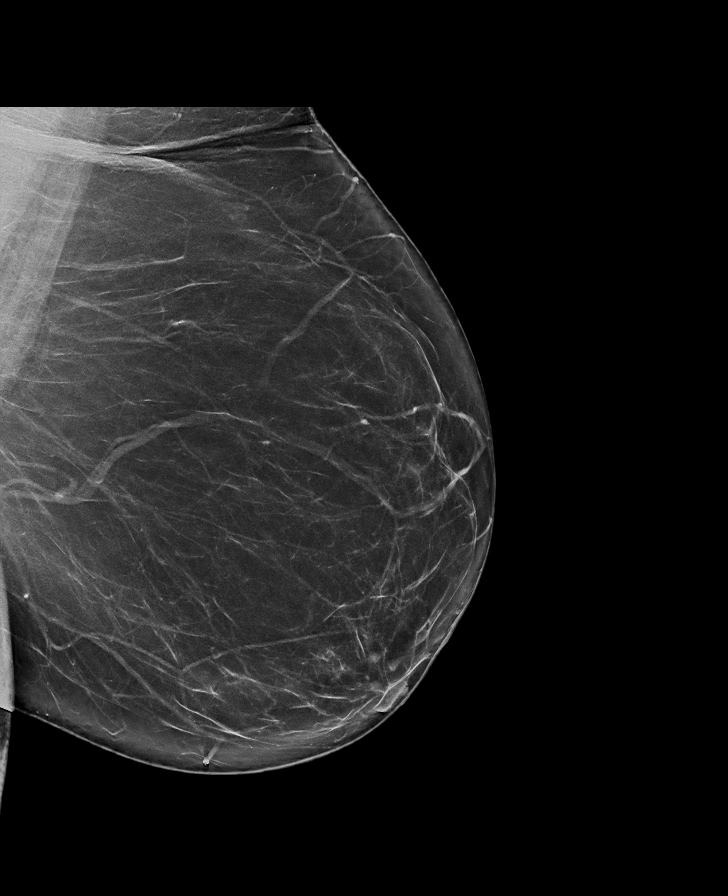

[R CC synth-2D]
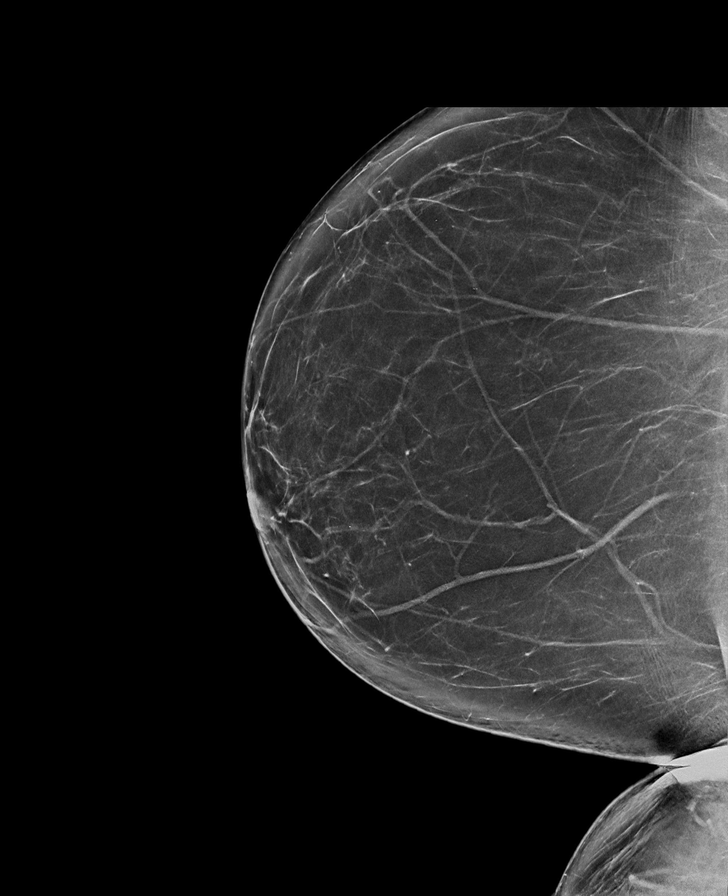

[L CC synth-2D]
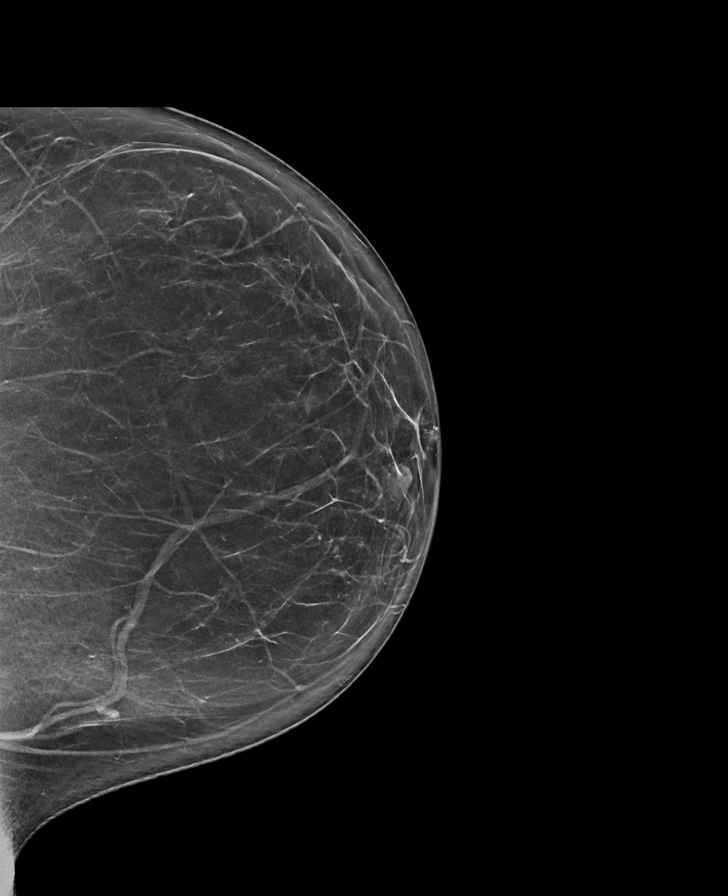

[R MLO synth-2D]
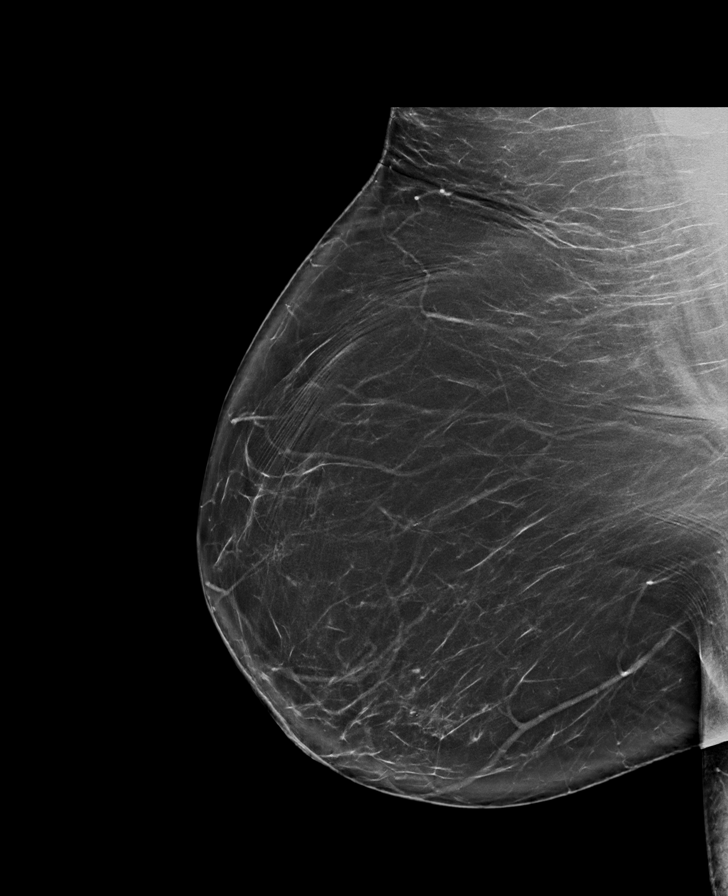

[L CC tomo · tomo slice 37/74.0]
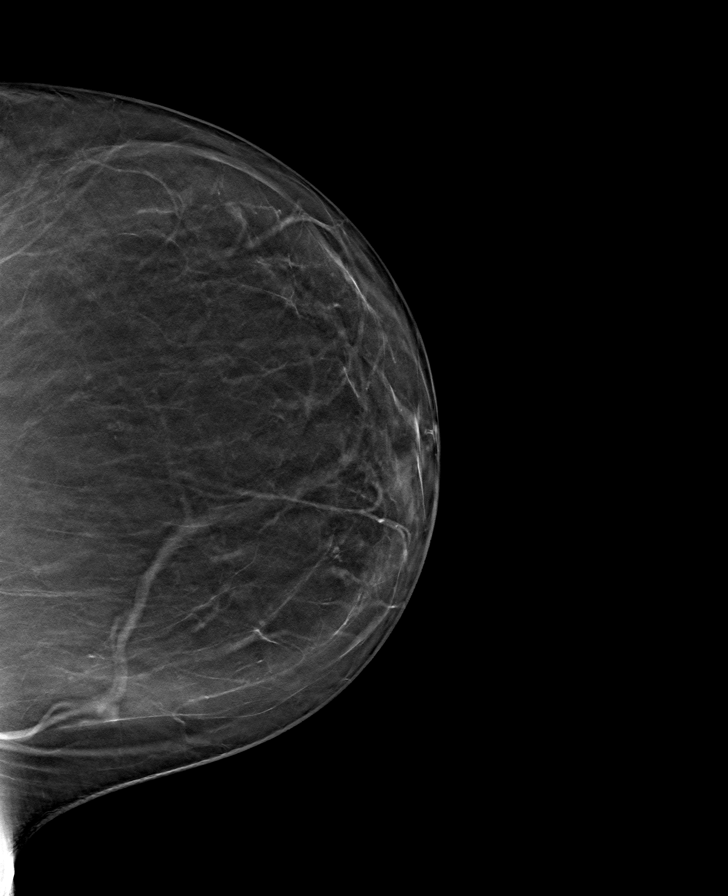

[R MLO tomo · tomo slice 45/90.0]
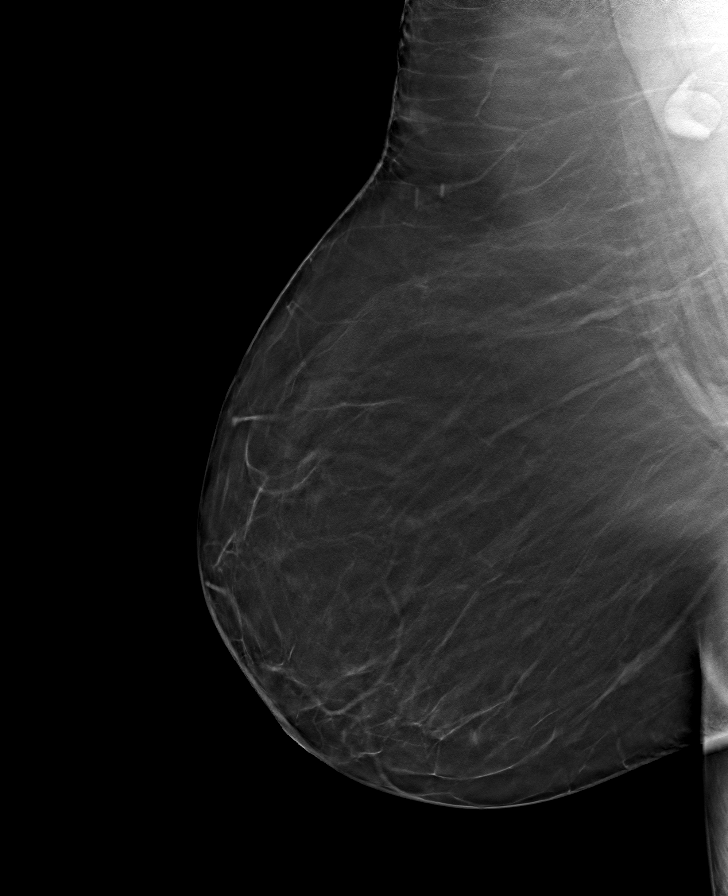

[R CC tomo · tomo slice 41/82.0]
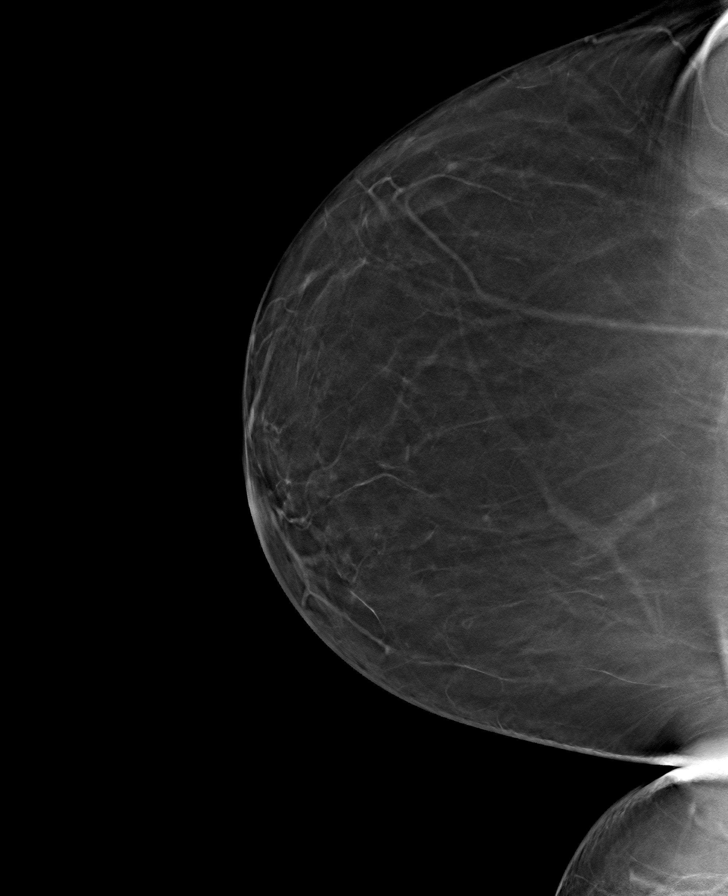

[L MLO tomo · tomo slice 44/87.0]
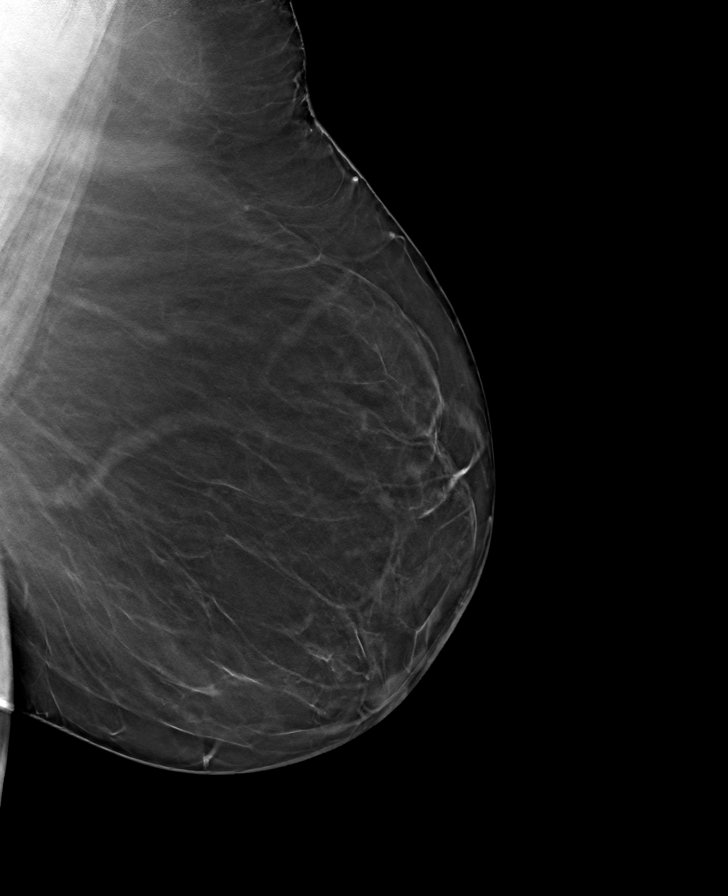

[8 of 24 positions shown; findings below may reference images not displayed]

ACR Breast Density Category b: There are scattered areas of
fibroglandular density.
FINDINGS: There are no findings suspicious for malignancy. Images were
processed with CAD.
IMPRESSION: No mammographic evidence of malignancy. A result letter of this
screening mammogram will be mailed directly to the patient.

RECOMMENDATION:
Screening mammogram in one year. (Code:[id number])

BI-RADS CATEGORY  1: Negative.

## 2019-01-16 ENCOUNTER — Encounter: Payer: Self-pay | Admitting: Orthopaedic Surgery

## 2019-01-16 ENCOUNTER — Other Ambulatory Visit: Payer: Self-pay

## 2019-01-16 ENCOUNTER — Ambulatory Visit (INDEPENDENT_AMBULATORY_CARE_PROVIDER_SITE_OTHER): Payer: 59 | Admitting: Orthopaedic Surgery

## 2019-01-16 VITALS — Ht 63.5 in | Wt 263.0 lb

## 2019-01-16 DIAGNOSIS — M170 Bilateral primary osteoarthritis of knee: Secondary | ICD-10-CM | POA: Diagnosis not present

## 2019-01-16 MED ORDER — METHYLPREDNISOLONE ACETATE 40 MG/ML IJ SUSP
80.0000 mg | INTRAMUSCULAR | Status: AC | PRN
  Administered 2019-01-16: 80 mg via INTRA_ARTICULAR

## 2019-01-16 MED ORDER — LIDOCAINE HCL 1 % IJ SOLN
2.0000 mL | INTRAMUSCULAR | Status: AC | PRN
  Administered 2019-01-16: 2 mL

## 2019-01-16 MED ORDER — BUPIVACAINE HCL 0.5 % IJ SOLN
2.0000 mL | INTRAMUSCULAR | Status: AC | PRN
  Administered 2019-01-16: 2 mL via INTRA_ARTICULAR

## 2019-01-16 NOTE — Progress Notes (Signed)
Office Visit Note   Patient: [name]           Date of Birth: 12-Nov-1960           MRN: [id number] Visit Date: 01/16/2019              Requested by: Velna Hatchet, MD [address] [place],  [place] [address] PCP: Velna Hatchet, MD   Assessment & Plan: Visit Diagnoses:  1. Bilateral primary osteoarthritis of knee     Plan: Recurrent symptoms of osteoarthritis left knee.  Will inject with cortisone and see back as needed again have discussed her weight with a BMI of 45  Follow-Up Instructions: Return if symptoms worsen or fail to improve.   Orders:  Orders Placed This Encounter  Procedures  . Large Joint Inj: L knee   No orders of the defined types were placed in this encounter.     Procedures: Large Joint Inj: L knee on 01/16/2019 4:05 PM Indications: pain and diagnostic evaluation Details: 25 G 1.5 in needle, anteromedial approach  Arthrogram: No  Medications: 2 mL lidocaine 1 %; 2 mL bupivacaine 0.5 %; 80 mg methylPREDNISolone acetate 40 MG/ML Procedure, treatment alternatives, risks and benefits explained, specific risks discussed. Consent was given by the patient. Patient was prepped and draped in the usual sterile fashion.       Clinical Data: No additional findings.   Subjective: Chief Complaint  Patient presents with  . Left Knee - Pain  Patient presents today for recurrent left knee pain. She finished the Euflexxa in April of 2020. She said that her knee started hurting again two months ago. Her pain is usually on and off, but seems to be worsening. She is taking tylenol for pain. She said that her knee will catch and give way occasionally. She is wanting to get a cortisone injection today.   HPI  Review of Systems  Constitutional: Negative for fatigue.  HENT: Negative for ear pain.   Eyes: Negative for pain.  Respiratory: Negative for shortness of breath.   Cardiovascular: Positive for leg swelling.  Gastrointestinal: Negative  for constipation and diarrhea.  Endocrine: Negative for cold intolerance and heat intolerance.  Genitourinary: Negative for difficulty urinating.  Musculoskeletal: Negative for joint swelling.  Skin: Negative for rash.  Allergic/Immunologic: Negative for food allergies.  Neurological: Negative for weakness.  Hematological: Bruises/bleeds easily.  Psychiatric/Behavioral: Positive for sleep disturbance.     Objective: Vital Signs: Ht 5' 3.5" (1.613 m)   Wt 263 lb (119.3 kg)   BMI 45.86 kg/m   Physical Exam Constitutional:      Appearance: She is well-developed.  Eyes:     Pupils: Pupils are equal, round, and reactive to light.  Pulmonary:     Effort: Pulmonary effort is normal.  Skin:    General: Skin is warm and dry.  Neurological:     Mental Status: She is alert and oriented to person, place, and time.  Psychiatric:        Behavior: Behavior normal.     Ortho Exam awake alert and oriented x3.  Comfortable sitting.  Left knee is large.  Might have a small effusion.  Predominately medial joint pain.  Minimal patellar crepitation.  No malalignment.  Neurologically intact  Specialty Comments:  No specialty comments available.  Imaging: No results found.   PMFS History: Patient Active Problem List   Diagnosis Date Noted  . Lumbar radiculopathy 12/18/2018  . Spinal stenosis of lumbar region with neurogenic claudication 12/18/2018  .  Post laminectomy syndrome 12/18/2018  . Bilateral primary osteoarthritis of knee 12/27/2016  . 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 Prediabetes 12/03/2015  . OSA on CPAP 02/19/2015  . Morbid obesity ([place]) 10/09/2014  . Pulmonary HTN ([place]) 05/12/2013  . Bilateral lower extremity edema 05/12/2013  . Morbid obesity with BMI of 50.0-59.9, adult ([place])   . Back pain, hx of prior back surgery 12/09/2011  . Pancreatitis, mild, CT negative 12/08/2011  . Fatty liver 12/08/2011  . Substernal chest pain, suspected secondary  to acute pancreatitis 12/06/2011  . Hypertension 12/06/2011  . Hypokalemia 12/06/2011  . Hypothyroidism 12/06/2011  . Anxiety 12/06/2011  . BV (bacterial vaginosis)   . Hyperlipidemia    Past Medical History:  Diagnosis Date  . Anemia when she was young  . Arthritis   . Congestion of nasal sinus   . Constipation    takes Colace every other day  . DDD (degenerative disc disease), lumbar   . Diabetes mellitus without complication ([place])    per pt had been diagnosed but then "number started going down so my doctor said i didnt have diabetes"  . Dry eyes    uses Systane Eye drops daily as needed  . GERD (gastroesophageal reflux disease) 12/06/2011   takes Nexium daily  . Headache(784.0)    occasionally-d/t congestion  . Heart murmur   . History of bronchitis 6-7 yrs ago  . Hyperlipidemia   . Hypertension    takes Metoprolol and Diovan daily  . Hypothyroidism 12/06/2011   takes Synthroid daily  . Joint pain   . Multiple allergies    takes Zyrtec daily;uses Nasonex daily as needed  . Nocturia   . Numbness    weakness-right hand  . OSA on CPAP   . Pancreatitis   . Peripheral edema    takes Furosemide daily  . Shingles   . Urinary frequency   . Venous insufficiency of leg     Family History  Problem Relation Age of Onset  . Hypertension Mother   . Hypertension Father   . Hypertension Sister   . Kidney disease Sister        [facility] TRANSPLANT  . Thyroid disease Sister   . Thyroid disease Sister   . Hypertension Sister   . Breast cancer Maternal Aunt        >50; passed away from it  . Breast cancer Maternal Aunt        >50    Past Surgical History:  Procedure Laterality Date  . ABDOMINAL HYSTERECTOMY     uterine prolapse  . CARDIAC SURGERY    . CHOLECYSTECTOMY    . COLONOSCOPY    . ESOPHAGOGASTRODUODENOSCOPY  5-62yrs ago  . FOOT SURGERY Bilateral   . KNEE ARTHROSCOPY    . LAPAROSCOPIC GASTRIC SLEEVE RESECTION N/A 08/23/2016   Procedure: LAPAROSCOPIC GASTRIC  SLEEVE RESECTION WITH UPPER ENDO;  Surgeon: Johnathan Hausen, MD;  Location: [facility];  Service: General;  Laterality: N/A;  . POSTERIOR LUMBAR FUSION  03/10/2009   L3-4 and L4-4 fusion, Dr. Leeroy Cha  . SHOULDER ARTHROSCOPY WITH ROTATOR CUFF REPAIR AND SUBACROMIAL DECOMPRESSION Right 08/23/2013   Procedure: RIGHT SHOULDER ARTHROSCOPY WITH  SUBACROMIAL DECOMPRESSION DISTAL CLAVICLE RESECTION AND  ROTATOR CUFF REPAIR ;  Surgeon: Marin Shutter, MD;  Location: [facility];  Service: Orthopedics;  Laterality: Right;  . TUBAL LIGATION     Social History   Occupational History    Employer: [employer]    Comment: [employer]  Tobacco Use  . Smoking status: Former Smoker    Packs/day: 0.75    Years: 40.00    Pack years: 30.00    Types: Cigarettes    Quit date: 1993    Years since quitting: 27.9  . Smokeless tobacco: Never Used  . Tobacco comment: quit smoking in 1993  Substance and Sexual Activity  . Alcohol use: Yes    Alcohol/week: 0.0 standard drinks    Comment: rarely;; mixed drink   . Drug use: No  . Sexual activity: Yes    Partners: Male    Birth control/protection: Surgical    Comment: Hysterectomy

## 2019-01-30 ENCOUNTER — Other Ambulatory Visit: Payer: Self-pay

## 2019-01-30 ENCOUNTER — Encounter: Payer: Self-pay | Admitting: Orthopaedic Surgery

## 2019-01-30 ENCOUNTER — Ambulatory Visit (INDEPENDENT_AMBULATORY_CARE_PROVIDER_SITE_OTHER): Payer: 59 | Admitting: Orthopaedic Surgery

## 2019-01-30 ENCOUNTER — Telehealth: Payer: Self-pay | Admitting: Orthopaedic Surgery

## 2019-01-30 VITALS — Ht 63.5 in | Wt 263.0 lb

## 2019-01-30 DIAGNOSIS — M170 Bilateral primary osteoarthritis of knee: Secondary | ICD-10-CM | POA: Diagnosis not present

## 2019-01-30 MED ORDER — BUPIVACAINE HCL 0.5 % IJ SOLN
2.0000 mL | INTRAMUSCULAR | Status: AC | PRN
  Administered 2019-01-30: 2 mL via INTRA_ARTICULAR

## 2019-01-30 MED ORDER — METHYLPREDNISOLONE ACETATE 40 MG/ML IJ SUSP
80.0000 mg | INTRAMUSCULAR | Status: AC | PRN
  Administered 2019-01-30: 80 mg via INTRA_ARTICULAR

## 2019-01-30 MED ORDER — LIDOCAINE HCL 1 % IJ SOLN
2.0000 mL | INTRAMUSCULAR | Status: AC | PRN
  Administered 2019-01-30: 2 mL

## 2019-01-30 NOTE — Telephone Encounter (Signed)
Please get precert for bilateral visco injections. This is Dr.Whitfield's patient.

## 2019-01-30 NOTE — Progress Notes (Signed)
Office Visit Note   Patient: [name]           Date of Birth: 1960-11-13           MRN: [id number] Visit Date: 01/30/2019              Requested by: Velna Hatchet, MD [address] [place],  [place] [address] PCP: Velna Hatchet, MD   Assessment & Plan: Visit Diagnoses:  1. Bilateral primary osteoarthritis of knee     Plan: Several week status post repeat cortisone injection left knee and doing well.  Will inject right knee with cortisone and precertified Visco supplementation last Euflexxa injections were in April and she has done quite well. This patient is diagnosed with osteoarthritis of the knee(s).    Radiographs show evidence of joint space narrowing, osteophytes, subchondral sclerosis and/or subchondral cysts.  This patient has knee pain which interferes with functional and activities of daily living.    This patient has experienced inadequate response, adverse effects and/or intolerance with conservative treatments such as acetaminophen, NSAIDS, topical creams, physical therapy or regular exercise, knee bracing and/or weight loss.   This patient has experienced inadequate response or has a contraindication to intra articular steroid injections for at least 3 months.   This patient is not scheduled to have a total knee replacement within 6 months of starting treatment with viscosupplementation.     Follow-Up Instructions: Return Precertified Visco.   Orders:  Orders Placed This Encounter  Procedures  . Large Joint Inj: R knee   No orders of the defined types were placed in this encounter.     Procedures: Large Joint Inj: R knee on 01/30/2019 3:02 PM Indications: pain and diagnostic evaluation Details: 25 G 1.5 in needle, anteromedial approach  Arthrogram: No  Medications: 2 mL lidocaine 1 %; 2 mL bupivacaine 0.5 %; 80 mg methylPREDNISolone acetate 40 MG/ML Procedure, treatment alternatives, risks and benefits explained, specific risks  discussed. Consent was given by the patient. Immediately prior to procedure a time out was called to verify the correct patient, procedure, equipment, support staff and site/side marked as required. Patient was prepped and draped in the usual sterile fashion.       Clinical Data: No additional findings.   Subjective: Chief Complaint  Patient presents with  . Right Knee - Pain  . Left Knee - Pain  Patient presents today for follow up on her knees. She was here on 01/16/2019 and had her left knee injected with cortisone. She is wanting to have her right knee injected today. She states that her left knee is feeling much better.  Has had prior viscosupplementation of both knees with Euflexxa.  Her last injections were in April and she would like to have them again as she did so well.  HPI  Review of Systems   Objective: Vital Signs: Ht 5' 3.5" (1.613 m)   Wt 263 lb (119.3 kg)   BMI 45.86 kg/m   Physical Exam Constitutional:      Appearance: She is well-developed.  Eyes:     Pupils: Pupils are equal, round, and reactive to light.  Pulmonary:     Effort: Pulmonary effort is normal.  Skin:    General: Skin is warm and dry.  Neurological:     Mental Status: She is alert and oriented to person, place, and time.  Psychiatric:        Behavior: Behavior normal.     Ortho Exam awake alert and oriented x3.  Comfortable sitting.  No pain relative to her left knee that was injected with cortisone 2 weeks ago.  Predominately medial joint pain right knee.  Full extension of flexion about 95 degrees when thigh touches calf.  No instability.  No obvious effusion Specialty Comments:  No specialty comments available.  Imaging: No results found.   PMFS History: Patient Active Problem List   Diagnosis Date Noted  . Lumbar radiculopathy 12/18/2018  . Spinal stenosis of lumbar region with neurogenic claudication 12/18/2018  . Post laminectomy syndrome 12/18/2018  . Bilateral primary  osteoarthritis of knee 12/27/2016  . 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 Prediabetes 12/03/2015  . OSA on CPAP 02/19/2015  . Morbid obesity ([place]) 10/09/2014  . Pulmonary HTN ([place]) 05/12/2013  . Bilateral lower extremity edema 05/12/2013  . Morbid obesity with BMI of 50.0-59.9, adult ([place])   . Back pain, hx of prior back surgery 12/09/2011  . Pancreatitis, mild, CT negative 12/08/2011  . Fatty liver 12/08/2011  . Substernal chest pain, suspected secondary to acute pancreatitis 12/06/2011  . Hypertension 12/06/2011  . Hypokalemia 12/06/2011  . Hypothyroidism 12/06/2011  . Anxiety 12/06/2011  . BV (bacterial vaginosis)   . Hyperlipidemia    Past Medical History:  Diagnosis Date  . Anemia when she was young  . Arthritis   . Congestion of nasal sinus   . Constipation    takes Colace every other day  . DDD (degenerative disc disease), lumbar   . Diabetes mellitus without complication ([place])    per pt had been diagnosed but then "number started going down so my doctor said i didnt have diabetes"  . Dry eyes    uses Systane Eye drops daily as needed  . GERD (gastroesophageal reflux disease) 12/06/2011   takes Nexium daily  . Headache(784.0)    occasionally-d/t congestion  . Heart murmur   . History of bronchitis 6-7 yrs ago  . Hyperlipidemia   . Hypertension    takes Metoprolol and Diovan daily  . Hypothyroidism 12/06/2011   takes Synthroid daily  . Joint pain   . Multiple allergies    takes Zyrtec daily;uses Nasonex daily as needed  . Nocturia   . Numbness    weakness-right hand  . OSA on CPAP   . Pancreatitis   . Peripheral edema    takes Furosemide daily  . Shingles   . Urinary frequency   . Venous insufficiency of leg     Family History  Problem Relation Age of Onset  . Hypertension Mother   . Hypertension Father   . Hypertension Sister   . Kidney disease Sister        [facility] TRANSPLANT  . Thyroid disease Sister   . Thyroid disease  Sister   . Hypertension Sister   . Breast cancer Maternal Aunt        >50; passed away from it  . Breast cancer Maternal Aunt        >50    Past Surgical History:  Procedure Laterality Date  . ABDOMINAL HYSTERECTOMY     uterine prolapse  . CARDIAC SURGERY    . CHOLECYSTECTOMY    . COLONOSCOPY    . ESOPHAGOGASTRODUODENOSCOPY  5-64yrs ago  . FOOT SURGERY Bilateral   . KNEE ARTHROSCOPY    . LAPAROSCOPIC GASTRIC SLEEVE RESECTION N/A 08/23/2016   Procedure: LAPAROSCOPIC GASTRIC SLEEVE RESECTION WITH UPPER ENDO;  Surgeon: Johnathan Hausen, MD;  Location: [facility];  Service: General;  Laterality: N/A;  . POSTERIOR  LUMBAR FUSION  03/10/2009   L3-4 and L4-4 fusion, Dr. Leeroy Cha  . SHOULDER ARTHROSCOPY WITH ROTATOR CUFF REPAIR AND SUBACROMIAL DECOMPRESSION Right 08/23/2013   Procedure: RIGHT SHOULDER ARTHROSCOPY WITH  SUBACROMIAL DECOMPRESSION DISTAL CLAVICLE RESECTION AND  ROTATOR CUFF REPAIR ;  Surgeon: Marin Shutter, MD;  Location: [facility];  Service: Orthopedics;  Laterality: Right;  . TUBAL LIGATION     Social History   Occupational History    Employer: [employer]    Comment: [employer]  Tobacco Use  . Smoking status: Former Smoker    Packs/day: 0.75    Years: 40.00    Pack years: 30.00    Types: Cigarettes    Quit date: 1993    Years since quitting: 28.0  . Smokeless tobacco: Never Used  . Tobacco comment: quit smoking in 1993  Substance and Sexual Activity  . Alcohol use: Yes    Alcohol/week: 0.0 standard drinks    Comment: rarely;; mixed drink   . Drug use: No  . Sexual activity: Yes    Partners: Male    Birth control/protection: Surgical    Comment: Hysterectomy

## 2019-01-31 DIAGNOSIS — G4733 Obstructive sleep apnea (adult) (pediatric): Secondary | ICD-10-CM | POA: Diagnosis not present

## 2019-02-01 NOTE — Telephone Encounter (Signed)
Noted  

## 2019-02-06 ENCOUNTER — Other Ambulatory Visit: Payer: Self-pay | Admitting: Cardiovascular Disease

## 2019-02-06 ENCOUNTER — Other Ambulatory Visit (INDEPENDENT_AMBULATORY_CARE_PROVIDER_SITE_OTHER): Payer: Self-pay | Admitting: Physical Medicine and Rehabilitation

## 2019-02-06 NOTE — Telephone Encounter (Signed)
Please advise 

## 2019-02-07 ENCOUNTER — Telehealth: Payer: Self-pay

## 2019-02-07 NOTE — Telephone Encounter (Signed)
Submitted VOB for Euflexxa series, bilateral knee. °

## 2019-02-08 ENCOUNTER — Telehealth: Payer: Self-pay | Admitting: Physical Medicine and Rehabilitation

## 2019-02-08 ENCOUNTER — Other Ambulatory Visit: Payer: Self-pay | Admitting: Physical Medicine and Rehabilitation

## 2019-02-08 MED ORDER — GABAPENTIN 300 MG PO CAPS: 300.0000 mg | ORAL_CAPSULE | Freq: Three times a day (TID) | ORAL | 1 refills | Status: DC

## 2019-02-08 NOTE — Telephone Encounter (Signed)
Left message to advise

## 2019-02-08 NOTE — Telephone Encounter (Signed)
Please advise 

## 2019-02-08 NOTE — Telephone Encounter (Signed)
Sent in for TID with qty 270 - 3 month supply. Einar Pheasant to Pharm on record - [facility]

## 2019-02-09 ENCOUNTER — Telehealth: Payer: Self-pay

## 2019-02-09 ENCOUNTER — Other Ambulatory Visit: Payer: Self-pay

## 2019-02-09 NOTE — Telephone Encounter (Signed)
Tried calling patient to schedule appointment for gel injection with Dr. Durward Fortes, but no answer and VM was to full to leave a message.  Approved for Euflexxa series, bilateral knee. [place] Must meet deductible first Covered at 100% of the allowable amount. Co-pay of $60.00 No PA required

## 2019-02-20 ENCOUNTER — Other Ambulatory Visit: Payer: Self-pay | Admitting: *Deleted

## 2019-02-20 MED ORDER — FUROSEMIDE 20 MG PO TABS: 60.0000 mg | ORAL_TABLET | ORAL | 3 refills | Status: DC | PRN

## 2019-03-05 ENCOUNTER — Encounter (HOSPITAL_COMMUNITY): Payer: Self-pay

## 2019-03-07 ENCOUNTER — Other Ambulatory Visit: Payer: Self-pay | Admitting: Cardiovascular Disease

## 2019-03-07 MED ORDER — FUROSEMIDE 20 MG PO TABS: 60.0000 mg | ORAL_TABLET | ORAL | 3 refills | Status: DC | PRN

## 2019-03-21 ENCOUNTER — Other Ambulatory Visit: Payer: Self-pay

## 2019-03-21 ENCOUNTER — Ambulatory Visit (INDEPENDENT_AMBULATORY_CARE_PROVIDER_SITE_OTHER): Payer: 59 | Admitting: Physical Medicine and Rehabilitation

## 2019-03-21 ENCOUNTER — Encounter: Payer: Self-pay | Admitting: Physical Medicine and Rehabilitation

## 2019-03-21 VITALS — BP 130/85 | HR 64 | Ht 64.0 in | Wt 263.0 lb

## 2019-03-21 DIAGNOSIS — G894 Chronic pain syndrome: Secondary | ICD-10-CM | POA: Diagnosis not present

## 2019-03-21 DIAGNOSIS — M5416 Radiculopathy, lumbar region: Secondary | ICD-10-CM

## 2019-03-21 DIAGNOSIS — M961 Postlaminectomy syndrome, not elsewhere classified: Secondary | ICD-10-CM

## 2019-03-21 NOTE — Progress Notes (Signed)
.  Numeric Pain Rating Scale and Functional Assessment Average Pain 7 Pain Right Now 6 My pain is constant and aching Pain is worse with: sitting Pain improves with: therapy/exercise   In the last MONTH (on 0-10 scale) has pain interfered with the following?  1. General activity like being  able to carry out your everyday physical activities such as walking, climbing stairs, carrying groceries, or moving a chair?  Rating(8)  2. Relation with others like being able to carry out your usual social activities and roles such as  activities at home, at work and in your community. Rating(8)  3. Enjoyment of life such that you have  been bothered by emotional problems such as feeling anxious, depressed or irritable?  Rating(4)

## 2019-03-22 ENCOUNTER — Other Ambulatory Visit: Payer: Self-pay | Admitting: Physical Medicine and Rehabilitation

## 2019-03-22 DIAGNOSIS — M961 Postlaminectomy syndrome, not elsewhere classified: Secondary | ICD-10-CM

## 2019-03-22 DIAGNOSIS — M5416 Radiculopathy, lumbar region: Secondary | ICD-10-CM

## 2019-03-22 NOTE — Progress Notes (Signed)
[name] - 59 y.o. female MRN [id number]  Date of birth: May 01, 1960  Office Visit Note: Visit Date: 03/21/2019 PCP: Velna Hatchet, MD Referred by: Velna Hatchet, MD  Subjective: Chief Complaint  Patient presents with  . Lower Back - Pain  . Right Leg - Pain  . Left Leg - Pain   HPI: [name] is a 59 y.o. female who comes in today For evaluation management of chronic worsening low back pain with referral pattern to the buttocks and legs.  This is a chronic problem with exacerbation over the last several months.  We did complete an injection which was a transforaminal epidural steroid injection above her fusion in November with good relief for a month or so.  She has had sacroiliac joint injections in the past with some relief.  She has had prior lumbar posterior fusion L3-4 and L4-5 by Dr. Joya Salm in 2011 I believe.  Her case is complicated by morbid obesity and prediabetes but she is on no medications for diabetes.  Her pain is severe she rates this as a 7 out of 10 but really finds difficulty doing daily activities her life enjoyment.  She has had multiple rounds of physical therapy chiropractic care in the remote past and medication management.  We have kept her on a combination of Lyrica and then gabapentin.  At this point she really has tried and failed all manner of conservative and nonconservative care including lumbar fusion and differential injections as well as medication management.  She continues to home exercise and stretch when she can.  She denies any focal weakness or bowel or bladder changes.  Any new issues really that would cause any concern.  She did have MRI updated and this is reviewed with her again using spine models and imaging.  It is detailed below.  Review of Systems  Constitutional: Negative for chills, fever, malaise/fatigue and weight loss.  HENT: Negative for hearing loss and sinus pain.   Eyes: Negative for blurred vision, double  vision and photophobia.  Respiratory: Negative for cough and shortness of breath.   Cardiovascular: Negative for chest pain, palpitations and leg swelling.  Gastrointestinal: Negative for abdominal pain, nausea and vomiting.  Genitourinary: Negative for flank pain.  Musculoskeletal: Positive for back pain and joint pain. Negative for myalgias.  Skin: Negative for itching and rash.  Neurological: Negative for tremors, focal weakness and weakness.  Endo/Heme/Allergies: Negative.   Psychiatric/Behavioral: Negative for depression.  All other systems reviewed and are negative.  Otherwise per HPI.  Assessment & Plan: Visit Diagnoses:  1. Lumbar radiculopathy   2. Post laminectomy syndrome   3. Chronic pain syndrome     Plan: Findings:  Chronic severe unrelenting low back pain recalcitrant to all manner of conservative care and nonconservative care this point.  She gets some temporary relief at times with injection but nothing long-term.  Medications have helped improve the symptoms but not to a great deal and more recently these have not helped at all.  Her full chart can be reviewed looking at all manner that is been used to try to treat her.  I do think at this point she is a candidate for spinal cord stimulator trial and implant.  This would be a procedure of last resort.  She would be a difficult case Duda body habitus but I think we should at least give her a chance to try to see if we can get that done.  She does have on last  MRI some narrowing at the T9-10 region but upon review it does look like he probably would have room there.  I do not think at this point we need thoracic MRI.  We will get her scheduled for neuropsych evaluation for free trial and implantation of spinal cord stimulator trial.  We would then try to seek preauthorization for the trial.  The patient does want to proceed with this and she was given a lot of information today and we talked at great length about her condition.      Meds & Orders: No orders of the defined types were placed in this encounter.   Orders Placed This Encounter  Procedures  . Neuropsychological testing    Follow-up: Return if symptoms worsen or fail to improve.   Procedures: No procedures performed  No notes on file   Clinical History: Lumbar spine MRI dated 06/11/2014 IMPRESSION: Interval fusion L3-4 L4-5. Posterior decompression at these levels without stenosis.  Progressive facet degeneration and mild spinal stenosis at L2-3.  Progressive facet degeneration on the left at L5-S1 with left foraminal encroachment.    She reports that she quit smoking about 28 years ago. Her smoking use included cigarettes. She has a 30.00 pack-year smoking history. She has never used smokeless tobacco. No results for input(s): HGBA1C, LABURIC in the last 8760 hours.  Objective:  VS:  HT:5\' 4"  (162.6 cm)   WT:263 lb (119.3 kg)  BMI:45.12    BP:130/85  HR:64bpm  TEMP: ( )  RESP:  Physical Exam Vitals and nursing note reviewed.  Constitutional:      General: She is not in acute distress.    Appearance: Normal appearance. She is well-developed. She is obese.  HENT:     Head: Normocephalic and atraumatic.     Nose: Nose normal.     Mouth/Throat:     Mouth: Mucous membranes are moist.     Pharynx: Oropharynx is clear.  Eyes:     Conjunctiva/sclera: Conjunctivae normal.     Pupils: Pupils are equal, round, and reactive to light.  Cardiovascular:     Rate and Rhythm: Regular rhythm.  Pulmonary:     Effort: Pulmonary effort is normal. No respiratory distress.  Abdominal:     General: There is no distension.     Palpations: Abdomen is soft.     Tenderness: There is no guarding.  Musculoskeletal:     Cervical back: Normal range of motion and neck supple.     Right lower leg: No edema.     Left lower leg: No edema.     Comments: Patient slow to stand from a seated position to full extension she does have some pain with extension  and facet loading.  She has pain across the PSIS bilaterally.  Pain over the greater trochanters but not exquisite pain.  No pain with hip rotation she has good distal strength.  Skin:    General: Skin is warm and dry.     Findings: No erythema or rash.  Neurological:     General: No focal deficit present.     Mental Status: She is alert and oriented to person, place, and time.     Motor: No abnormal muscle tone.     Coordination: Coordination normal.     Gait: Gait normal.  Psychiatric:        Mood and Affect: Mood normal.        Behavior: Behavior normal.        Thought Content: Thought content  normal.     Ortho Exam Imaging: No results found.  Past Medical/Family/Surgical/Social History: Medications & Allergies reviewed per EMR, new medications updated. Patient Active Problem List   Diagnosis Date Noted  . Lumbar radiculopathy 12/18/2018  . Spinal stenosis of lumbar region with neurogenic claudication 12/18/2018  . Post laminectomy syndrome 12/18/2018  . Bilateral primary osteoarthritis of knee 12/27/2016  . 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 Prediabetes 12/03/2015  . OSA on CPAP 02/19/2015  . Morbid obesity ([place]) 10/09/2014  . Pulmonary HTN ([place]) 05/12/2013  . Bilateral lower extremity edema 05/12/2013  . Morbid obesity with BMI of 50.0-59.9, adult ([place])   . Back pain, hx of prior back surgery 12/09/2011  . Pancreatitis, mild, CT negative 12/08/2011  . Fatty liver 12/08/2011  . Substernal chest pain, suspected secondary to acute pancreatitis 12/06/2011  . Hypertension 12/06/2011  . Hypokalemia 12/06/2011  . Hypothyroidism 12/06/2011  . Anxiety 12/06/2011  . BV (bacterial vaginosis)   . Hyperlipidemia    Past Medical History:  Diagnosis Date  . Anemia when she was young  . Arthritis   . Congestion of nasal sinus   . Constipation    takes Colace every other day  . DDD (degenerative disc disease), lumbar   . Diabetes mellitus without  complication ([place])    per pt had been diagnosed but then "number started going down so my doctor said i didnt have diabetes"  . Dry eyes    uses Systane Eye drops daily as needed  . GERD (gastroesophageal reflux disease) 12/06/2011   takes Nexium daily  . Headache(784.0)    occasionally-d/t congestion  . Heart murmur   . History of bronchitis 6-7 yrs ago  . Hyperlipidemia   . Hypertension    takes Metoprolol and Diovan daily  . Hypothyroidism 12/06/2011   takes Synthroid daily  . Joint pain   . Multiple allergies    takes Zyrtec daily;uses Nasonex daily as needed  . Nocturia   . Numbness    weakness-right hand  . OSA on CPAP   . Pancreatitis   . Peripheral edema    takes Furosemide daily  . Shingles   . Urinary frequency   . Venous insufficiency of leg    Family History  Problem Relation Age of Onset  . Hypertension Mother   . Hypertension Father   . Hypertension Sister   . Kidney disease Sister        [facility] TRANSPLANT  . Thyroid disease Sister   . Thyroid disease Sister   . Hypertension Sister   . Breast cancer Maternal Aunt        >50; passed away from it  . Breast cancer Maternal Aunt        >50   Past Surgical History:  Procedure Laterality Date  . ABDOMINAL HYSTERECTOMY     uterine prolapse  . CARDIAC SURGERY    . CHOLECYSTECTOMY    . COLONOSCOPY    . ESOPHAGOGASTRODUODENOSCOPY  5-57yrs ago  . FOOT SURGERY Bilateral   . KNEE ARTHROSCOPY    . LAPAROSCOPIC GASTRIC SLEEVE RESECTION N/A 08/23/2016   Procedure: LAPAROSCOPIC GASTRIC SLEEVE RESECTION WITH UPPER ENDO;  Surgeon: Johnathan Hausen, MD;  Location: [facility];  Service: General;  Laterality: N/A;  . POSTERIOR LUMBAR FUSION  03/10/2009   L3-4 and L4-4 fusion, Dr. Leeroy Cha  . SHOULDER ARTHROSCOPY WITH ROTATOR CUFF REPAIR AND SUBACROMIAL DECOMPRESSION Right 08/23/2013   Procedure: RIGHT SHOULDER ARTHROSCOPY WITH  SUBACROMIAL DECOMPRESSION DISTAL CLAVICLE RESECTION  AND  ROTATOR CUFF REPAIR ;  Surgeon:  Marin Shutter, MD;  Location: [facility];  Service: Orthopedics;  Laterality: Right;  . TUBAL LIGATION     Social History   Occupational History    Employer: [employer]    Comment: [employer]  Tobacco Use  . Smoking status: Former Smoker    Packs/day: 0.75    Years: 40.00    Pack years: 30.00    Types: Cigarettes    Quit date: 1993    Years since quitting: 28.1  . Smokeless tobacco: Never Used  . Tobacco comment: quit smoking in 1993  Substance and Sexual Activity  . Alcohol use: Yes    Alcohol/week: 0.0 standard drinks    Comment: rarely;; mixed drink   . Drug use: No  . Sexual activity: Yes    Partners: Male    Birth control/protection: Surgical    Comment: Hysterectomy

## 2019-03-27 ENCOUNTER — Other Ambulatory Visit: Payer: Self-pay | Admitting: Physical Medicine and Rehabilitation

## 2019-03-27 NOTE — Telephone Encounter (Signed)
Please Advise

## 2019-04-02 ENCOUNTER — Telehealth: Payer: Self-pay | Admitting: *Deleted

## 2019-04-03 NOTE — Telephone Encounter (Signed)
If that helped can repeat

## 2019-04-03 NOTE — Telephone Encounter (Signed)
Called pt and she is scheduled for 04/20/19 at 1015a

## 2019-04-06 DIAGNOSIS — Z0100 Encounter for examination of eyes and vision without abnormal findings: Secondary | ICD-10-CM | POA: Diagnosis not present

## 2019-04-06 DIAGNOSIS — G4733 Obstructive sleep apnea (adult) (pediatric): Secondary | ICD-10-CM | POA: Diagnosis not present

## 2019-04-13 DIAGNOSIS — R202 Paresthesia of skin: Secondary | ICD-10-CM | POA: Diagnosis not present

## 2019-04-13 DIAGNOSIS — I7300 Raynaud's syndrome without gangrene: Secondary | ICD-10-CM | POA: Diagnosis not present

## 2019-04-13 DIAGNOSIS — E1169 Type 2 diabetes mellitus with other specified complication: Secondary | ICD-10-CM | POA: Diagnosis not present

## 2019-04-19 ENCOUNTER — Encounter: Payer: Self-pay | Admitting: Physical Medicine and Rehabilitation

## 2019-04-19 NOTE — Progress Notes (Signed)
[name] - 59 y.o. female MRN [id number]  Date of birth: 20-Jun-1960  Office Visit Note: Visit Date: 12/13/2018 PCP: Velna Hatchet, MD Referred by: Velna Hatchet, MD  Subjective: Chief Complaint  Patient presents with  . Lower Back - Pain   HPI: [name] is a 59 y.o. female who comes in today Reevaluation management of mostly axial low back pain some referral into the right buttock and hip region.  No groin pain.  Her history with me is quite well-documented we have seen her for many years for a combination of S1 transforaminal injection and sacroiliac joint injection with intermittent good relief.  We have also had medication management with good results with Lyrica but she ended up not tolerating that after a while.  She has done okay with gabapentin.  We ordered an MRI of the lumbar spine as an update when she had continued worsening pain because of her old MRI was from 2016.  This MRI is reviewed with her today and I did review the notes.  She does have adjacent level disease above the fusion at L3-4 but is really only mild central canal narrowing but some foraminal narrowing.  Below the fusion at L5-S1 the radiologist says there is a fusion here but is just because she has this big curvature with lordosis.  At least from a surgical standpoint she has not been fused at L5-S1.  She does have degenerative changes of both sacroiliac joint shown on prior x-rays.  She has had no red flag complaints of focal weakness but feels weak at times.  Pain is worse with walking and going from sit to stand.  Her average pain is 8 out of 10.  Review of Systems  Constitutional: Negative for chills, fever, malaise/fatigue and weight loss.  HENT: Negative for hearing loss and sinus pain.   Eyes: Negative for blurred vision, double vision and photophobia.  Respiratory: Negative for cough and shortness of breath.   Cardiovascular: Negative for chest pain, palpitations and leg  swelling.  Gastrointestinal: Negative for abdominal pain, nausea and vomiting.  Genitourinary: Negative for flank pain.  Musculoskeletal: Positive for back pain and joint pain. Negative for myalgias.  Skin: Negative for itching and rash.  Neurological: Negative for tremors, focal weakness and weakness.  Endo/Heme/Allergies: Negative.   Psychiatric/Behavioral: Negative for depression.  All other systems reviewed and are negative.  Otherwise per HPI.  Assessment & Plan: Visit Diagnoses:  1. Chronic right-sided low back pain with right-sided sciatica   2. Foraminal stenosis of lumbar region   3. Post laminectomy syndrome   4. Sacroiliitis (HCC)     Plan: Findings:  Chronic worsening low back pain right buttock pain with history of lumbar fusion L3-4 L4-5 with new MRI update not showing any great level of stenosis other than foraminal stenosis at L2-3.  She continues to have some degenerative changes of the sacroiliac joints with intermittent relief with sacroiliac joint injection.  She has been and is being followed by rheumatology.  Her case is complicated by morbid obesity.  She has had gastric sleeve surgery in the past.  At this point we do want to try some baclofen for her.  I also want to try a right L2 transforaminal injection at the level of foraminal stenosis.  Consider.  Consideration in the future should be given to spinal cord stimulator trial.    Meds & Orders:  Meds ordered this encounter  Medications  . DISCONTD: baclofen (LIORESAL) 10 MG tablet  Sig: Take 1/2 to 1 by mouth every 8hrs as needed for spasm    Dispense:  60 tablet    Refill:  0   No orders of the defined types were placed in this encounter.   Follow-up: Return for Right L2-3 transforaminal epidural.   Procedures: No procedures performed  No notes on file   Clinical History: MRI LUMBAR SPINE WITHOUT CONTRAST  TECHNIQUE: Multiplanar, multisequence MR imaging of the lumbar spine was performed. No  intravenous contrast was administered.  COMPARISON:  MRI of the lumbar spine without contrast 06/11/2014  FINDINGS: Segmentation: 5 non rib-bearing lumbar type vertebral bodies are present. The lowest fully formed vertebral body is L5.  Alignment:  No significant listhesis or curvature is present.  Vertebrae: Marrow signal and vertebral body heights are maintained. T1 shortening at L5 suggests hemangioma. This is stable.  Conus medullaris and cauda equina: Conus extends to the L1-2 level. Conus and cauda equina appear normal.  Paraspinal and other soft tissues: Within normal limits  Disc levels:  T11-12: Mild disc bulging is present. There is no significant stenosis.  T12-L1: Mild disc bulging is present. No significant stenosis is present.  L1-2: Mild disc bulging is present. There is no significant stenosis. Mild facet hypertrophy is noted bilaterally.  L2-3: Advanced facet hypertrophy has progressed. A broad-based disc protrusion is noted. Mild central canal stenosis has progressed. Moderate bilateral foraminal stenosis has progressed, left greater than right.  L3-4: Wide laminectomy and solid fusion are stable. There is no recurrent stenosis.  L4-5: Wide laminectomy is present. No residual recurrent stenosis is present. Chronic posterior epidural fluid collection is stable, non-compressive.  L5-S1: Laminectomy is noted. No residual recurrent stenosis is present.  IMPRESSION: 1. Progressive adjacent level disease at L2-3 with mild central canal stenosis and moderate bilateral foraminal stenosis, left greater than right. 2. Stable fusion at L3-4, L4-5, and L5-S1 without significant residual or recurrent stenosis at these levels.   Electronically Signed   By: San Morelle M.D.   On: 12/11/2018 15:00   She reports that she quit smoking about 28 years ago. Her smoking use included cigarettes. She has a 30.00 pack-year smoking history.  She has never used smokeless tobacco. No results for input(s): HGBA1C, LABURIC in the last 8760 hours.  Objective:  VS:  HT:    WT:   BMI:     BP:126/79  HR:63bpm  TEMP: ( )  RESP:  Physical Exam Vitals and nursing note reviewed.  Constitutional:      General: She is not in acute distress.    Appearance: Normal appearance. She is well-developed. She is obese. She is not ill-appearing.  HENT:     Head: Normocephalic and atraumatic.  Eyes:     Conjunctiva/sclera: Conjunctivae normal.     Pupils: Pupils are equal, round, and reactive to light.  Cardiovascular:     Rate and Rhythm: Normal rate.     Pulses: Normal pulses.  Pulmonary:     Effort: Pulmonary effort is normal.  Musculoskeletal:     Right lower leg: No edema.     Left lower leg: No edema.     Comments: Patient is slow to rise from a seated position.  She has pain with facet loading and stiffness with extension.  Some pain over the right PSIS and she does have a positive Fortin finger sign on the right.  She does have some equivocal Patrick's more right than left.  Good distal strength.  Skin:    General:  Skin is warm and dry.     Findings: No erythema or rash.  Neurological:     General: No focal deficit present.     Mental Status: She is alert and oriented to person, place, and time.     Sensory: No sensory deficit.     Motor: No abnormal muscle tone.     Coordination: Coordination normal.     Gait: Gait normal.  Psychiatric:        Mood and Affect: Mood normal.        Behavior: Behavior normal.     Ortho Exam Imaging: No results found.  Past Medical/Family/Surgical/Social History: Medications & Allergies reviewed per EMR, new medications updated. Patient Active Problem List   Diagnosis Date Noted  . Lumbar radiculopathy 12/18/2018  . Spinal stenosis of lumbar region with neurogenic claudication 12/18/2018  . Post laminectomy syndrome 12/18/2018  . Bilateral primary osteoarthritis of knee 12/27/2016  .  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 Prediabetes 12/03/2015  . OSA on CPAP 02/19/2015  . Morbid obesity ([place]) 10/09/2014  . Pulmonary HTN ([place]) 05/12/2013  . Bilateral lower extremity edema 05/12/2013  . Morbid obesity with BMI of 50.0-59.9, adult ([place])   . Back pain, hx of prior back surgery 12/09/2011  . Pancreatitis, mild, CT negative 12/08/2011  . Fatty liver 12/08/2011  . Substernal chest pain, suspected secondary to acute pancreatitis 12/06/2011  . Hypertension 12/06/2011  . Hypokalemia 12/06/2011  . Hypothyroidism 12/06/2011  . Anxiety 12/06/2011  . BV (bacterial vaginosis)   . Hyperlipidemia    Past Medical History:  Diagnosis Date  . Anemia when she was young  . Arthritis   . Congestion of nasal sinus   . Constipation    takes Colace every other day  . DDD (degenerative disc disease), lumbar   . Diabetes mellitus without complication ([place])    per pt had been diagnosed but then "number started going down so my doctor said i didnt have diabetes"  . Dry eyes    uses Systane Eye drops daily as needed  . GERD (gastroesophageal reflux disease) 12/06/2011   takes Nexium daily  . Headache(784.0)    occasionally-d/t congestion  . Heart murmur   . History of bronchitis 6-7 yrs ago  . Hyperlipidemia   . Hypertension    takes Metoprolol and Diovan daily  . Hypothyroidism 12/06/2011   takes Synthroid daily  . Joint pain   . Multiple allergies    takes Zyrtec daily;uses Nasonex daily as needed  . Nocturia   . Numbness    weakness-right hand  . OSA on CPAP   . Pancreatitis   . Peripheral edema    takes Furosemide daily  . Shingles   . Urinary frequency   . Venous insufficiency of leg    Family History  Problem Relation Age of Onset  . Hypertension Mother   . Hypertension Father   . Hypertension Sister   . Kidney disease Sister        [facility] TRANSPLANT  . Thyroid disease Sister   . Thyroid disease Sister   . Hypertension Sister   .  Breast cancer Maternal Aunt        >50; passed away from it  . Breast cancer Maternal Aunt        >50   Past Surgical History:  Procedure Laterality Date  . ABDOMINAL HYSTERECTOMY     uterine prolapse  . CARDIAC SURGERY    . CHOLECYSTECTOMY    .  COLONOSCOPY    . ESOPHAGOGASTRODUODENOSCOPY  5-28yrs ago  . FOOT SURGERY Bilateral   . KNEE ARTHROSCOPY    . LAPAROSCOPIC GASTRIC SLEEVE RESECTION N/A 08/23/2016   Procedure: LAPAROSCOPIC GASTRIC SLEEVE RESECTION WITH UPPER ENDO;  Surgeon: Johnathan Hausen, MD;  Location: [facility];  Service: General;  Laterality: N/A;  . POSTERIOR LUMBAR FUSION  03/10/2009   L3-4 and L3-4 fusion, Dr. Leeroy Cha  . SHOULDER ARTHROSCOPY WITH ROTATOR CUFF REPAIR AND SUBACROMIAL DECOMPRESSION Right 08/23/2013   Procedure: RIGHT SHOULDER ARTHROSCOPY WITH  SUBACROMIAL DECOMPRESSION DISTAL CLAVICLE RESECTION AND  ROTATOR CUFF REPAIR ;  Surgeon: Marin Shutter, MD;  Location: [facility];  Service: Orthopedics;  Laterality: Right;  . TUBAL LIGATION     Social History   Occupational History    Employer: [employer]    Comment: [employer]  Tobacco Use  . Smoking status: Former Smoker    Packs/day: 0.75    Years: 40.00    Pack years: 30.00    Types: Cigarettes    Quit date: 1993    Years since quitting: 28.2  . Smokeless tobacco: Never Used  . Tobacco comment: quit smoking in 1993  Substance and Sexual Activity  . Alcohol use: Yes    Alcohol/week: 0.0 standard drinks    Comment: rarely;; mixed drink   . Drug use: No  . Sexual activity: Yes    Partners: Male    Birth control/protection: Surgical    Comment: Hysterectomy

## 2019-04-20 ENCOUNTER — Ambulatory Visit: Payer: Self-pay

## 2019-04-20 ENCOUNTER — Other Ambulatory Visit: Payer: Self-pay

## 2019-04-20 ENCOUNTER — Encounter: Payer: Self-pay | Admitting: Physical Medicine and Rehabilitation

## 2019-04-20 ENCOUNTER — Ambulatory Visit (INDEPENDENT_AMBULATORY_CARE_PROVIDER_SITE_OTHER): Payer: 59 | Admitting: Physical Medicine and Rehabilitation

## 2019-04-20 DIAGNOSIS — M461 Sacroiliitis, not elsewhere classified: Secondary | ICD-10-CM

## 2019-04-20 NOTE — Progress Notes (Signed)
[name] - 59 y.o. female MRN [id number]  Date of birth: 1960-02-24  Office Visit Note: Visit Date: 04/20/2019 PCP: Velna Hatchet, MD Referred by: Velna Hatchet, MD  Subjective: Chief Complaint  Patient presents with  . Lower Back - Pain   HPI:  [name] is a 60 y.o. female who comes in today For planned bilateral sacroiliac joint injections with fluoroscopic guidance.  Patient has lumbar fusion with fairly current MRI showing some adjacent level disease above the fusion but mild with moderate foraminal narrowing but no central canal narrowing.  She has pain consistent with sacroiliac joint dysfunction and sacroiliitis bilaterally.  X-ray imaging does show bilateral degenerative changes of both sacroiliac joints.  She is following with rheumatology.  ROS Otherwise per HPI.  Assessment & Plan: Visit Diagnoses:  1. Sacroiliitis ([place])     Plan: No additional findings.   Meds & Orders: No orders of the defined types were placed in this encounter.   Orders Placed This Encounter  Procedures  . Sacroiliac Joint Inj  . XR C-ARM NO REPORT    Follow-up: No follow-ups on file.   Procedures: Sacroiliac Joint Inj (Bilateral) on 04/20/2019 10:24 AM Indications: pain and diagnostic evaluation Details: 22 G 3.5 in needle, fluoroscopy-guided posterior approach Outcome: tolerated well, no immediate complications  There was excellent flow of contrast producing a partial arthrogram of the sacroiliac joint.  Procedure, treatment alternatives, risks and benefits explained, specific risks discussed. Consent was given by the patient. Immediately prior to procedure a time out was called to verify the correct patient, procedure, equipment, support staff and site/side marked as required. Patient was prepped and draped in the usual sterile fashion.      No notes on file   Clinical History: MRI LUMBAR SPINE WITHOUT CONTRAST  TECHNIQUE: Multiplanar, multisequence  MR imaging of the lumbar spine was performed. No intravenous contrast was administered.  COMPARISON:  MRI of the lumbar spine without contrast 06/11/2014  FINDINGS: Segmentation: 5 non rib-bearing lumbar type vertebral bodies are present. The lowest fully formed vertebral body is L5.  Alignment:  No significant listhesis or curvature is present.  Vertebrae: Marrow signal and vertebral body heights are maintained. T1 shortening at L5 suggests hemangioma. This is stable.  Conus medullaris and cauda equina: Conus extends to the L1-2 level. Conus and cauda equina appear normal.  Paraspinal and other soft tissues: Within normal limits  Disc levels:  T11-12: Mild disc bulging is present. There is no significant stenosis.  T12-L1: Mild disc bulging is present. No significant stenosis is present.  L1-2: Mild disc bulging is present. There is no significant stenosis. Mild facet hypertrophy is noted bilaterally.  L2-3: Advanced facet hypertrophy has progressed. A broad-based disc protrusion is noted. Mild central canal stenosis has progressed. Moderate bilateral foraminal stenosis has progressed, left greater than right.  L3-4: Wide laminectomy and solid fusion are stable. There is no recurrent stenosis.  L4-5: Wide laminectomy is present. No residual recurrent stenosis is present. Chronic posterior epidural fluid collection is stable, non-compressive.  L5-S1: Laminectomy is noted. No residual recurrent stenosis is present.  IMPRESSION: 1. Progressive adjacent level disease at L2-3 with mild central canal stenosis and moderate bilateral foraminal stenosis, left greater than right. 2. Stable fusion at L3-4, L4-5, and L5-S1 without significant residual or recurrent stenosis at these levels.   Electronically Signed   By: San Morelle M.D.   On: 12/11/2018 15:00     Objective:  VS:  HT:  WT:   BMI:     BP:   HR: bpm  TEMP: ( )  RESP:    Physical Exam  Ortho Exam Imaging: No results found.

## 2019-04-20 NOTE — Progress Notes (Signed)
  Numeric Pain Rating Scale and Functional Assessment Average Pain 9   In the last MONTH (on 0-10 scale) has pain interfered with the following?  1. General activity like being  able to carry out your everyday physical activities such as walking, climbing stairs, carrying groceries, or moving a chair?  Rating(9)   -Driver, - -Dye Allergies.

## 2019-04-28 ENCOUNTER — Other Ambulatory Visit: Payer: Self-pay | Admitting: Cardiovascular Disease

## 2019-05-03 DIAGNOSIS — Z01818 Encounter for other preprocedural examination: Secondary | ICD-10-CM | POA: Diagnosis not present

## 2019-05-03 DIAGNOSIS — G894 Chronic pain syndrome: Secondary | ICD-10-CM | POA: Diagnosis not present

## 2019-05-04 DIAGNOSIS — G894 Chronic pain syndrome: Secondary | ICD-10-CM | POA: Insufficient documentation

## 2019-05-10 ENCOUNTER — Telehealth: Payer: Self-pay

## 2019-05-10 ENCOUNTER — Telehealth: Payer: Self-pay | Admitting: Orthopaedic Surgery

## 2019-05-10 NOTE — Telephone Encounter (Signed)
Per April back in January.Marland KitchenMarland KitchenMarland KitchenTried calling patient to schedule appointment for gel injection with Dr. Durward Fortes, but no answer and VM was to full to leave a message.   Approved for Euflexxa series, bilateral knee. [place] Must meet deductible first Covered at 100% of the allowable amount. Co-pay of $60.00 No PA required

## 2019-05-10 NOTE — Telephone Encounter (Signed)
Called pt and informed and scheduled appointments

## 2019-05-10 NOTE — Telephone Encounter (Signed)
Ok .  If [name] has had them in the past then has to be at least 6 months from her last injection

## 2019-05-10 NOTE — Telephone Encounter (Signed)
Patient called. She would like to know if she can get gel injections in her knee. Her call back number is [phone]

## 2019-05-10 NOTE — Telephone Encounter (Signed)
Please advise 

## 2019-05-10 NOTE — Telephone Encounter (Signed)
Please precert for bilateral Euflexxa injections. This is Dr.Whitfield's patient. Thanks! 

## 2019-05-10 NOTE — Telephone Encounter (Signed)
Spoke with patient. She has been notified that we are getting preauth for the injections and will call to schedule when we get authorization

## 2019-05-23 ENCOUNTER — Other Ambulatory Visit: Payer: Self-pay

## 2019-05-23 ENCOUNTER — Encounter: Payer: Self-pay | Admitting: Orthopaedic Surgery

## 2019-05-23 ENCOUNTER — Ambulatory Visit (INDEPENDENT_AMBULATORY_CARE_PROVIDER_SITE_OTHER): Payer: 59 | Admitting: Orthopaedic Surgery

## 2019-05-23 VITALS — Ht 64.0 in | Wt 263.0 lb

## 2019-05-23 DIAGNOSIS — M170 Bilateral primary osteoarthritis of knee: Secondary | ICD-10-CM

## 2019-05-23 MED ORDER — SODIUM HYALURONATE (VISCOSUP) 20 MG/2ML IX SOSY
20.0000 mg | PREFILLED_SYRINGE | INTRA_ARTICULAR | Status: AC | PRN
  Administered 2019-05-23: 20 mg via INTRA_ARTICULAR

## 2019-05-23 NOTE — Progress Notes (Signed)
Office Visit Note   Patient: [name]           Date of Birth: 1960-07-23           MRN: [id number] Visit Date: 05/23/2019              Requested by: Velna Hatchet, MD [address] [place],  [place] [address] PCP: Velna Hatchet, MD   Assessment & Plan: Visit Diagnoses:  1. Bilateral primary osteoarthritis of knee     Plan: First Euflexxa injections both knees.  Return weekly for the next 2 weeks to complete the series  Follow-Up Instructions: Return in about 1 week (around 05/30/2019).   Orders:  Orders Placed This Encounter  Procedures  . Large Joint Inj: bilateral knee   No orders of the defined types were placed in this encounter.     Procedures: Large Joint Inj: bilateral knee on 05/23/2019 4:25 PM Indications: pain and joint swelling Details: 25 G 1.5 in needle, anteromedial approach  Arthrogram: No  Medications (Right): 20 mg Sodium Hyaluronate 20 MG/2ML Medications (Left): 20 mg Sodium Hyaluronate 20 MG/2ML Outcome: tolerated well, no immediate complications Procedure, treatment alternatives, risks and benefits explained, specific risks discussed. Consent was given by the patient. Immediately prior to procedure a time out was called to verify the correct patient, procedure, equipment, support staff and site/side marked as required. Patient was prepped and draped in the usual sterile fashion.       Clinical Data: No additional findings.   Subjective: Chief Complaint  Patient presents with  . Right Knee - Pain    Bilateral Euflexxa series starting 05/23/2019  . Left Knee - Pain  Patient presents today for the first injection of the Euflexxa series bilaterally.   HPI  Review of Systems   Objective: Vital Signs: Ht 5\' 4"  (1.626 m)   Wt 263 lb (119.3 kg)   BMI 45.14 kg/m   Physical Exam  Ortho Exam large knees but no obvious effusion.  Some mild tenderness along the medial aspect of the knees.  Full extension and flexed probably  95 degrees  Specialty Comments:  No specialty comments available.  Imaging: No results found.   PMFS History: Patient Active Problem List   Diagnosis Date Noted  . Lumbar radiculopathy 12/18/2018  . Spinal stenosis of lumbar region with neurogenic claudication 12/18/2018  . Post laminectomy syndrome 12/18/2018  . Bilateral primary osteoarthritis of knee 12/27/2016  . 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 Prediabetes 12/03/2015  . OSA on CPAP 02/19/2015  . Morbid obesity ([place]) 10/09/2014  . Pulmonary HTN ([place]) 05/12/2013  . Bilateral lower extremity edema 05/12/2013  . Morbid obesity with BMI of 50.0-59.9, adult ([place])   . Back pain, hx of prior back surgery 12/09/2011  . Pancreatitis, mild, CT negative 12/08/2011  . Fatty liver 12/08/2011  . Substernal chest pain, suspected secondary to acute pancreatitis 12/06/2011  . Hypertension 12/06/2011  . Hypokalemia 12/06/2011  . Hypothyroidism 12/06/2011  . Anxiety 12/06/2011  . BV (bacterial vaginosis)   . Hyperlipidemia    Past Medical History:  Diagnosis Date  . Anemia when she was young  . Arthritis   . Congestion of nasal sinus   . Constipation    takes Colace every other day  . DDD (degenerative disc disease), lumbar   . Diabetes mellitus without complication ([place])    per pt had been diagnosed but then "number started going down so my doctor said i didnt have diabetes"  .  Dry eyes    uses Systane Eye drops daily as needed  . GERD (gastroesophageal reflux disease) 12/06/2011   takes Nexium daily  . Headache(784.0)    occasionally-d/t congestion  . Heart murmur   . History of bronchitis 6-7 yrs ago  . Hyperlipidemia   . Hypertension    takes Metoprolol and Diovan daily  . Hypothyroidism 12/06/2011   takes Synthroid daily  . Joint pain   . Multiple allergies    takes Zyrtec daily;uses Nasonex daily as needed  . Nocturia   . Numbness    weakness-right hand  . OSA on CPAP   . Pancreatitis    . Peripheral edema    takes Furosemide daily  . Shingles   . Urinary frequency   . Venous insufficiency of leg     Family History  Problem Relation Age of Onset  . Hypertension Mother   . Hypertension Father   . Hypertension Sister   . Kidney disease Sister        [facility] TRANSPLANT  . Thyroid disease Sister   . Thyroid disease Sister   . Hypertension Sister   . Breast cancer Maternal Aunt        >50; passed away from it  . Breast cancer Maternal Aunt        >50    Past Surgical History:  Procedure Laterality Date  . ABDOMINAL HYSTERECTOMY     uterine prolapse  . CARDIAC SURGERY    . CHOLECYSTECTOMY    . COLONOSCOPY    . ESOPHAGOGASTRODUODENOSCOPY  5-12yrs ago  . FOOT SURGERY Bilateral   . KNEE ARTHROSCOPY    . LAPAROSCOPIC GASTRIC SLEEVE RESECTION N/A 08/23/2016   Procedure: LAPAROSCOPIC GASTRIC SLEEVE RESECTION WITH UPPER ENDO;  Surgeon: Johnathan Hausen, MD;  Location: [facility];  Service: General;  Laterality: N/A;  . POSTERIOR LUMBAR FUSION  03/10/2009   L3-4 and L3-4 fusion, Dr. Leeroy Cha  . SHOULDER ARTHROSCOPY WITH ROTATOR CUFF REPAIR AND SUBACROMIAL DECOMPRESSION Right 08/23/2013   Procedure: RIGHT SHOULDER ARTHROSCOPY WITH  SUBACROMIAL DECOMPRESSION DISTAL CLAVICLE RESECTION AND  ROTATOR CUFF REPAIR ;  Surgeon: Marin Shutter, MD;  Location: [facility];  Service: Orthopedics;  Laterality: Right;  . TUBAL LIGATION     Social History   Occupational History    Employer: [employer]    Comment: [employer]  Tobacco Use  . Smoking status: Former Smoker    Packs/day: 0.75    Years: 40.00    Pack years: 30.00    Types: Cigarettes    Quit date: 1993    Years since quitting: 28.3  . Smokeless tobacco: Never Used  . Tobacco comment: quit smoking in 1993  Substance and Sexual Activity  . Alcohol use: Yes    Alcohol/week: 0.0 standard drinks    Comment: rarely;; mixed drink   . Drug use: No  . Sexual activity: Yes    Partners: Male    Birth control/protection: Surgical      Comment: Hysterectomy

## 2019-05-31 ENCOUNTER — Encounter: Payer: Self-pay | Admitting: Orthopaedic Surgery

## 2019-05-31 ENCOUNTER — Other Ambulatory Visit: Payer: Self-pay

## 2019-05-31 ENCOUNTER — Ambulatory Visit (INDEPENDENT_AMBULATORY_CARE_PROVIDER_SITE_OTHER): Payer: 59 | Admitting: Orthopaedic Surgery

## 2019-05-31 VITALS — Ht 64.0 in | Wt 263.0 lb

## 2019-05-31 DIAGNOSIS — M170 Bilateral primary osteoarthritis of knee: Secondary | ICD-10-CM

## 2019-05-31 MED ORDER — SODIUM HYALURONATE (VISCOSUP) 20 MG/2ML IX SOSY
20.0000 mg | PREFILLED_SYRINGE | INTRA_ARTICULAR | Status: AC | PRN
  Administered 2019-05-31: 20 mg via INTRA_ARTICULAR

## 2019-05-31 MED ORDER — SODIUM HYALURONATE (VISCOSUP) 20 MG/2ML IX SOSY
20.0000 mg | PREFILLED_SYRINGE | INTRA_ARTICULAR | Status: AC | PRN
  Administered 2019-05-31: 16:00:00 20 mg via INTRA_ARTICULAR

## 2019-05-31 NOTE — Progress Notes (Signed)
Office Visit Note   Patient: [name]           Date of Birth: [birth year]           MRN: [id number] Visit Date: 05/31/2019              Requested by: Velna Hatchet, MD [address] [place],  [place] [address] PCP: Velna Hatchet, MD   Assessment & Plan: Visit Diagnoses:  1. Bilateral primary osteoarthritis of knee     Plan: Second Euflexxa injection both knees.  Return next week to complete the series of 3.  No problems referable to the first injection  Follow-Up Instructions: Return in about 1 week (around 06/07/2019).   Orders:  Orders Placed This Encounter  Procedures  . Large Joint Inj: bilateral knee   No orders of the defined types were placed in this encounter.     Procedures: Large Joint Inj: bilateral knee on 05/31/2019 4:21 PM Indications: pain and joint swelling Details: 25 G 1.5 in needle, anteromedial approach  Arthrogram: No  Medications (Right): 20 mg Sodium Hyaluronate 20 MG/2ML Medications (Left): 20 mg Sodium Hyaluronate 20 MG/2ML Outcome: tolerated well, no immediate complications Procedure, treatment alternatives, risks and benefits explained, specific risks discussed. Consent was given by the patient. Immediately prior to procedure a time out was called to verify the correct patient, procedure, equipment, support staff and site/side marked as required. Patient was prepped and draped in the usual sterile fashion.       Clinical Data: No additional findings.   Subjective: Chief Complaint  Patient presents with  . Right Knee - Pain    Euflexxa series bilaterally  . Left Knee - Pain  Patient presents today for the second injections of the Euflexxa series. Doing well and wishes to continue.  HPI  Review of Systems   Objective: Vital Signs: Ht 5\' 4"  (1.626 m)   Wt 263 lb (119.3 kg)   BMI 45.14 kg/m   Physical Exam  Ortho Exam large knees.  No specific tenderness.  No erythema or ecchymosis  Specialty Comments:    No specialty comments available.  Imaging: No results found.   PMFS History: Patient Active Problem List   Diagnosis Date Noted  . Lumbar radiculopathy 12/18/2018  . Spinal stenosis of lumbar region with neurogenic claudication 12/18/2018  . Post laminectomy syndrome 12/18/2018  . Bilateral primary osteoarthritis of knee 12/27/2016  . 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 Prediabetes 12/03/2015  . OSA on CPAP 02/19/2015  . Morbid obesity ([place]) 10/09/2014  . Pulmonary HTN ([place]) 05/12/2013  . Bilateral lower extremity edema 05/12/2013  . Morbid obesity with BMI of 50.0-59.9, adult ([place])   . Back pain, hx of prior back surgery 12/09/2011  . Pancreatitis, mild, CT negative 12/08/2011  . Fatty liver 12/08/2011  . Substernal chest pain, suspected secondary to acute pancreatitis 12/06/2011  . Hypertension 12/06/2011  . Hypokalemia 12/06/2011  . Hypothyroidism 12/06/2011  . Anxiety 12/06/2011  . BV (bacterial vaginosis)   . Hyperlipidemia    Past Medical History:  Diagnosis Date  . Anemia when she was young  . Arthritis   . Congestion of nasal sinus   . Constipation    takes Colace every other day  . DDD (degenerative disc disease), lumbar   . Diabetes mellitus without complication ([place])    per pt had been diagnosed but then "number started going down so my doctor said i didnt have diabetes"  . Dry eyes  uses Systane Eye drops daily as needed  . GERD (gastroesophageal reflux disease) 12/06/2011   takes Nexium daily  . Headache(784.0)    occasionally-d/t congestion  . Heart murmur   . History of bronchitis 6-7 yrs ago  . Hyperlipidemia   . Hypertension    takes Metoprolol and Diovan daily  . Hypothyroidism 12/06/2011   takes Synthroid daily  . Joint pain   . Multiple allergies    takes Zyrtec daily;uses Nasonex daily as needed  . Nocturia   . Numbness    weakness-right hand  . OSA on CPAP   . Pancreatitis   . Peripheral edema    takes  Furosemide daily  . Shingles   . Urinary frequency   . Venous insufficiency of leg     Family History  Problem Relation Age of Onset  . Hypertension Mother   . Hypertension Father   . Hypertension Sister   . Kidney disease Sister        [facility] TRANSPLANT  . Thyroid disease Sister   . Thyroid disease Sister   . Hypertension Sister   . Breast cancer Maternal Aunt        >50; passed away from it  . Breast cancer Maternal Aunt        >50    Past Surgical History:  Procedure Laterality Date  . ABDOMINAL HYSTERECTOMY     uterine prolapse  . CARDIAC SURGERY    . CHOLECYSTECTOMY    . COLONOSCOPY    . ESOPHAGOGASTRODUODENOSCOPY  5-31yrs ago  . FOOT SURGERY Bilateral   . KNEE ARTHROSCOPY    . LAPAROSCOPIC GASTRIC SLEEVE RESECTION N/A 08/23/2016   Procedure: LAPAROSCOPIC GASTRIC SLEEVE RESECTION WITH UPPER ENDO;  Surgeon: Johnathan Hausen, MD;  Location: [facility];  Service: General;  Laterality: N/A;  . POSTERIOR LUMBAR FUSION  03/10/2009   L3-4 and L3-4 fusion, Dr. Leeroy Cha  . SHOULDER ARTHROSCOPY WITH ROTATOR CUFF REPAIR AND SUBACROMIAL DECOMPRESSION Right 08/23/2013   Procedure: RIGHT SHOULDER ARTHROSCOPY WITH  SUBACROMIAL DECOMPRESSION DISTAL CLAVICLE RESECTION AND  ROTATOR CUFF REPAIR ;  Surgeon: Marin Shutter, MD;  Location: [place];  Service: Orthopedics;  Laterality: Right;  . TUBAL LIGATION     Social History   Occupational History    Employer: [employer]    Comment: [employer]  Tobacco Use  . Smoking status: Former Smoker    Packs/day: 0.75    Years: 40.00    Pack years: 30.00    Types: Cigarettes    Quit date: 1993    Years since quitting: 28.3  . Smokeless tobacco: Never Used  . Tobacco comment: quit smoking in 1993  Substance and Sexual Activity  . Alcohol use: Yes    Alcohol/week: 0.0 standard drinks    Comment: rarely;; mixed drink   . Drug use: No  . Sexual activity: Yes    Partners: Male    Birth control/protection: Surgical    Comment: Hysterectomy

## 2019-06-07 ENCOUNTER — Other Ambulatory Visit: Payer: Self-pay

## 2019-06-07 ENCOUNTER — Ambulatory Visit (INDEPENDENT_AMBULATORY_CARE_PROVIDER_SITE_OTHER): Payer: 59 | Admitting: Orthopaedic Surgery

## 2019-06-07 ENCOUNTER — Encounter: Payer: Self-pay | Admitting: Orthopaedic Surgery

## 2019-06-07 DIAGNOSIS — M170 Bilateral primary osteoarthritis of knee: Secondary | ICD-10-CM

## 2019-06-07 MED ORDER — SODIUM HYALURONATE (VISCOSUP) 20 MG/2ML IX SOSY
20.0000 mg | PREFILLED_SYRINGE | INTRA_ARTICULAR | Status: AC | PRN
  Administered 2019-06-07: 20 mg via INTRA_ARTICULAR

## 2019-06-07 NOTE — Progress Notes (Signed)
Office Visit Note   Patient: [name]           Date of Birth: 1960/05/01           MRN: [id number] Visit Date: 06/07/2019              Requested by: Velna Hatchet, MD [address] [place],  [place] [address] PCP: Velna Hatchet, MD   Assessment & Plan: Visit Diagnoses:  1. Bilateral primary osteoarthritis of knee     Plan: Third and final Euflexxa injections both knees.  [name] relates that it is helping on the left and she is not so sure on the right.  Would like to see her back in a month  Follow-Up Instructions: Return if symptoms worsen or fail to improve.   Orders:  No orders of the defined types were placed in this encounter.  No orders of the defined types were placed in this encounter.     Procedures: Large Joint Inj: bilateral knee on 06/07/2019 4:04 PM Indications: pain and joint swelling Details: 25 G 1.5 in needle, anteromedial approach  Arthrogram: No  Medications (Right): 20 mg Sodium Hyaluronate 20 MG/2ML Medications (Left): 20 mg Sodium Hyaluronate 20 MG/2ML Outcome: tolerated well, no immediate complications Procedure, treatment alternatives, risks and benefits explained, specific risks discussed. Consent was given by the patient. Immediately prior to procedure a time out was called to verify the correct patient, procedure, equipment, support staff and site/side marked as required. Patient was prepped and draped in the usual sterile fashion.       Clinical Data: No additional findings.   Subjective: Chief Complaint  Patient presents with  . Left Knee - Follow-up  . Right Knee - Follow-up  Has had 2 Euflexxa injections in both of her knees.  [name] relates that the left knee is feeling better but she is not sure about the right.  No fever or chills  HPI  Review of Systems   Objective: Vital Signs: There were no vitals taken for this visit.  Physical Exam  Ortho Exam large knees.  Difficult to be sure there  is no effusion mild medial joint pain on the right.  None on the left.  Full extension flexed about 95 degrees based on the size of her legs.  Specialty Comments:  No specialty comments available.  Imaging: No results found.   PMFS History: Patient Active Problem List   Diagnosis Date Noted  . Lumbar radiculopathy 12/18/2018  . Spinal stenosis of lumbar region with neurogenic claudication 12/18/2018  . Post laminectomy syndrome 12/18/2018  . Bilateral primary osteoarthritis of knee 12/27/2016  . 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 Prediabetes 12/03/2015  . OSA on CPAP 02/19/2015  . Morbid obesity ([place]) 10/09/2014  . Pulmonary HTN ([place]) 05/12/2013  . Bilateral lower extremity edema 05/12/2013  . Morbid obesity with BMI of 50.0-59.9, adult ([place])   . Back pain, hx of prior back surgery 12/09/2011  . Pancreatitis, mild, CT negative 12/08/2011  . Fatty liver 12/08/2011  . Substernal chest pain, suspected secondary to acute pancreatitis 12/06/2011  . Hypertension 12/06/2011  . Hypokalemia 12/06/2011  . Hypothyroidism 12/06/2011  . Anxiety 12/06/2011  . BV (bacterial vaginosis)   . Hyperlipidemia    Past Medical History:  Diagnosis Date  . Anemia when she was young  . Arthritis   . Congestion of nasal sinus   . Constipation    takes Colace every other day  . DDD (degenerative disc  disease), lumbar   . Diabetes mellitus without complication ([place])    per pt had been diagnosed but then "number started going down so my doctor said i didnt have diabetes"  . Dry eyes    uses Systane Eye drops daily as needed  . GERD (gastroesophageal reflux disease) 12/06/2011   takes Nexium daily  . Headache(784.0)    occasionally-d/t congestion  . Heart murmur   . History of bronchitis 6-7 yrs ago  . Hyperlipidemia   . Hypertension    takes Metoprolol and Diovan daily  . Hypothyroidism 12/06/2011   takes Synthroid daily  . Joint pain   . Multiple allergies     takes Zyrtec daily;uses Nasonex daily as needed  . Nocturia   . Numbness    weakness-right hand  . OSA on CPAP   . Pancreatitis   . Peripheral edema    takes Furosemide daily  . Shingles   . Urinary frequency   . Venous insufficiency of leg     Family History  Problem Relation Age of Onset  . Hypertension Mother   . Hypertension Father   . Hypertension Sister   . Kidney disease Sister        [facility] TRANSPLANT  . Thyroid disease Sister   . Thyroid disease Sister   . Hypertension Sister   . Breast cancer Maternal Aunt        >50; passed away from it  . Breast cancer Maternal Aunt        >50    Past Surgical History:  Procedure Laterality Date  . ABDOMINAL HYSTERECTOMY     uterine prolapse  . CARDIAC SURGERY    . CHOLECYSTECTOMY    . COLONOSCOPY    . ESOPHAGOGASTRODUODENOSCOPY  5-74yrs ago  . FOOT SURGERY Bilateral   . KNEE ARTHROSCOPY    . LAPAROSCOPIC GASTRIC SLEEVE RESECTION N/A 08/23/2016   Procedure: LAPAROSCOPIC GASTRIC SLEEVE RESECTION WITH UPPER ENDO;  Surgeon: Johnathan Hausen, MD;  [facility];  Service: General;  Laterality: N/A;  . POSTERIOR LUMBAR FUSION  03/10/2009   L3-4 and L3-4 fusion, Dr. Leeroy Cha  . SHOULDER ARTHROSCOPY WITH ROTATOR CUFF REPAIR AND SUBACROMIAL DECOMPRESSION Right 08/23/2013   Procedure: RIGHT SHOULDER ARTHROSCOPY WITH  SUBACROMIAL DECOMPRESSION DISTAL CLAVICLE RESECTION AND  ROTATOR CUFF REPAIR ;  Surgeon: Marin Shutter, MD;  Location: [place];  Service: Orthopedics;  Laterality: Right;  . TUBAL LIGATION     Social History   Occupational History    Employer: [employer]    Comment: [employer]  Tobacco Use  . Smoking status: Former Smoker    Packs/day: 0.75    Years: 40.00    Pack years: 30.00    Types: Cigarettes    Quit date: 1993    Years since quitting: 28.3  . Smokeless tobacco: Never Used  . Tobacco comment: quit smoking in 1993  Substance and Sexual Activity  . Alcohol use: Yes    Alcohol/week: 0.0 standard drinks     Comment: rarely;; mixed drink   . Drug use: No  . Sexual activity: Yes    Partners: Male    Birth control/protection: Surgical    Comment: Hysterectomy     Garald Balding, MD   Note - This record has been created using [employer].  Chart creation errors have been sought, but may not always  have been located. Such creation errors do not reflect on  the standard of medical care.

## 2019-06-09 ENCOUNTER — Other Ambulatory Visit: Payer: Self-pay | Admitting: Physical Medicine and Rehabilitation

## 2019-06-11 NOTE — Telephone Encounter (Signed)
Please advise 

## 2019-06-15 DIAGNOSIS — G514 Facial myokymia: Secondary | ICD-10-CM | POA: Diagnosis not present

## 2019-06-21 ENCOUNTER — Other Ambulatory Visit: Payer: Self-pay | Admitting: Physical Medicine and Rehabilitation

## 2019-06-21 DIAGNOSIS — F411 Generalized anxiety disorder: Secondary | ICD-10-CM

## 2019-06-21 MED ORDER — DIAZEPAM 5 MG PO TABS: ORAL_TABLET | ORAL | 0 refills | Status: DC

## 2019-06-21 NOTE — Progress Notes (Signed)
Pre-procedure diazepam ordered for pre-operative anxiety.  

## 2019-06-22 ENCOUNTER — Other Ambulatory Visit (HOSPITAL_COMMUNITY): Payer: Self-pay | Admitting: Internal Medicine

## 2019-06-29 DIAGNOSIS — G4733 Obstructive sleep apnea (adult) (pediatric): Secondary | ICD-10-CM | POA: Diagnosis not present

## 2019-07-06 ENCOUNTER — Other Ambulatory Visit: Payer: Self-pay

## 2019-07-06 DIAGNOSIS — Z9884 Bariatric surgery status: Secondary | ICD-10-CM | POA: Diagnosis not present

## 2019-07-09 ENCOUNTER — Ambulatory Visit (INDEPENDENT_AMBULATORY_CARE_PROVIDER_SITE_OTHER): Payer: 59 | Admitting: Obstetrics and Gynecology

## 2019-07-09 ENCOUNTER — Other Ambulatory Visit: Payer: Self-pay

## 2019-07-09 ENCOUNTER — Encounter: Payer: Self-pay | Admitting: Obstetrics and Gynecology

## 2019-07-09 VITALS — BP 124/64 | HR 101 | Temp 97.9°F | Ht 62.0 in | Wt 266.6 lb

## 2019-07-09 DIAGNOSIS — N8111 Cystocele, midline: Secondary | ICD-10-CM | POA: Diagnosis not present

## 2019-07-09 DIAGNOSIS — Z01419 Encounter for gynecological examination (general) (routine) without abnormal findings: Secondary | ICD-10-CM

## 2019-07-09 DIAGNOSIS — B372 Candidiasis of skin and nail: Secondary | ICD-10-CM | POA: Diagnosis not present

## 2019-07-09 MED ORDER — NYSTATIN 100000 UNIT/GM EX CREA
1.0000 | TOPICAL_CREAM | Freq: Two times a day (BID) | CUTANEOUS | 1 refills | Status: DC
Start: 2019-07-09 — End: 2021-08-11

## 2019-07-09 NOTE — Progress Notes (Addendum)
59 y.o. G65P2002 Divorced Black or [place] American Not Hispanic or Latino female here for annual exam.  Not sexually active in the last year. No bowel or bladder c/o.     H/O hysterectomy, still has her ovaries.   H/O intermittent vulvitis, prn use of steroids. Improved over time.   On gabapentin for vasomotor symptoms and pain. Still hurting a lot. Is supposed to get a neural stimulator.  She has trouble falling asleep and if she wakes up it's hard to get back to sleep secondary to the discomfort. Not able to exercise right now.   She has some darkening of the skin under her panus.   No LMP recorded. Patient has had a hysterectomy.          Sexually active: No.  The current method of family planning is post menopausal status.    Exercising: No.  The patient does not participate in regular exercise at present. Smoker:  no  Health Maintenance: Pap: 2015 normal per patient  History of abnormal Pap:  no MMG:  01/04/19 density A Bi-rads 1 neg  BMD:  Few years ago normal per patient. Colonoscopy: 2013 normal per patient  TDaP:  03/25/13 Gardasil: N/A   reports that she quit smoking about 28 years ago. Her smoking use included cigarettes. She has a 30.00 pack-year smoking history. She has never used smokeless tobacco. She reports current alcohol use. She reports that she does not use drugs. Rare ETOH. Works in [employer]. 2 kids, local. 3 grandchildren (13,16 and 53).  Past Medical History:  Diagnosis Date  . Anemia when she was young  . Arthritis   . Congestion of nasal sinus   . Constipation    takes Colace every other day  . DDD (degenerative disc disease), lumbar   . Diabetes mellitus without complication ([place])    per pt had been diagnosed but then "number started going down so my doctor said i didnt have diabetes"  . Dry eyes    uses Systane Eye drops daily as needed  . GERD (gastroesophageal reflux disease) 12/06/2011   takes Nexium daily  . Headache(784.0)     occasionally-d/t congestion  . Heart murmur   . History of bronchitis 6-7 yrs ago  . Hyperlipidemia   . Hypertension    takes Metoprolol and Diovan daily  . Hypothyroidism 12/06/2011   takes Synthroid daily  . Joint pain   . Multiple allergies    takes Zyrtec daily;uses Nasonex daily as needed  . Nocturia   . Numbness    weakness-right hand  . OSA on CPAP   . Pancreatitis   . Peripheral edema    takes Furosemide daily  . Shingles   . Urinary frequency   . Venous insufficiency of leg     Past Surgical History:  Procedure Laterality Date  . ABDOMINAL HYSTERECTOMY     uterine prolapse  . CARDIAC SURGERY    . CHOLECYSTECTOMY    . COLONOSCOPY    . ESOPHAGOGASTRODUODENOSCOPY  5-62yrs ago  . FOOT SURGERY Bilateral   . KNEE ARTHROSCOPY    . LAPAROSCOPIC GASTRIC SLEEVE RESECTION N/A 08/23/2016   Procedure: LAPAROSCOPIC GASTRIC SLEEVE RESECTION WITH UPPER ENDO;  Surgeon: Johnathan Hausen, MD;  [facility];  Service: General;  Laterality: N/A;  . POSTERIOR LUMBAR FUSION  03/10/2009   L3-4 and L3-4 fusion, Dr. Leeroy Cha  . SHOULDER ARTHROSCOPY WITH ROTATOR CUFF REPAIR AND SUBACROMIAL DECOMPRESSION Right 08/23/2013   Procedure: RIGHT SHOULDER ARTHROSCOPY WITH  SUBACROMIAL  DECOMPRESSION DISTAL CLAVICLE RESECTION AND  ROTATOR CUFF REPAIR ;  Surgeon: Marin Shutter, MD;  Location: [place];  Service: Orthopedics;  Laterality: Right;  . TUBAL LIGATION      Current Outpatient Medications  Medication Sig Dispense Refill  . albuterol (PROVENTIL HFA;VENTOLIN HFA) 108 (90 BASE) MCG/ACT inhaler Inhale 1-2 puffs into the lungs every 6 (six) hours as needed for wheezing or shortness of breath. 1 Inhaler 2  . aspirin EC 81 MG tablet Take 81 mg by mouth daily.     . Azelastine HCl 137 MCG/SPRAY SOLN   3  . baclofen (LIORESAL) 10 MG tablet TAKE 1/2 TO 1 TABLET BY MOUTH EVERY 8 HOURS AS NEEDED FOR SPASMS 60 tablet 0  . betamethasone valerate ointment (VALISONE) 0.1 % Apply a pea sized amount  topically BID for up to 2 weeks. Not for daily long term use. 30 g 0  . diazepam (VALIUM) 5 MG tablet Take 0.5-1 tablets (2.5-5 mg total) by mouth every 6 (six) hours as needed for muscle spasms or sedation. 40 tablet 1  . diazepam (VALIUM) 5 MG tablet Take 1 by mouth 1 hour  pre-procedure with very light food. May bring 2nd tablet to appointment. 2 tablet 0  . esomeprazole (NEXIUM) 40 MG capsule Take 40 mg by mouth daily.     . fluticasone (FLONASE) 50 MCG/ACT nasal spray Place 1 spray into both nostrils daily as needed for allergies.   1  . furosemide (LASIX) 20 MG tablet Take 3 tablets (60 mg total) by mouth as needed. NEED OV. 90 tablet 3  . gabapentin (NEURONTIN) 300 MG capsule Take 1 capsule (300 mg total) by mouth 3 (three) times daily. 270 capsule 1  . ibuprofen (ADVIL,MOTRIN) 600 MG tablet   0  . levothyroxine (SYNTHROID, LEVOTHROID) 75 MCG tablet Take 75 mcg by mouth daily before breakfast.    . LINZESS 72 MCG capsule Take 72 mcg by mouth daily.    Marland Kitchen lisinopril (ZESTRIL) 20 MG tablet TAKE 1 TABLET BY MOUTH DAILY. 90 tablet 0  . loratadine (CLARITIN) 10 MG tablet Take 10 mg by mouth daily.    . magnesium oxide (MAG-OX) 400 MG tablet Take 400 mg by mouth daily.    . meloxicam (MOBIC) 15 MG tablet   3  . methocarbamol (ROBAXIN) 500 MG tablet TAKE 1 TABLET BY MOUTH EVERY 8 HOURS AS NEEDED FOR MUSCLE SPASMS 60 tablet 0  . metoprolol tartrate (LOPRESSOR) 25 MG tablet Take 25 mg by mouth 2 (two) times daily.    . Omega-3 Fatty Acids (FISH OIL) 1000 MG CAPS Take 1,000 mg by mouth daily.    . ondansetron (ZOFRAN) 4 MG tablet Take 1 tablet (4 mg total) by mouth every 8 (eight) hours as needed for nausea or vomiting. 20 tablet 0  . vitamin C (ASCORBIC ACID) 500 MG tablet Take 500 mg by mouth daily.    Marland Kitchen VITAMIN D PO Take by mouth.    . potassium chloride SA (K-DUR) 20 MEQ tablet TAKE 1 TABLET (20 MEQ TOTAL) BY MOUTH DAILY. 90 tablet 1   No current facility-administered medications for this  visit.    Family History  Problem Relation Age of Onset  . Hypertension Mother   . Hypertension Father   . Hypertension Sister   . Kidney disease Sister        [facility] TRANSPLANT  . Thyroid disease Sister   . Thyroid disease Sister   . Hypertension Sister   . Breast cancer  Maternal Aunt        >50; passed away from it  . Breast cancer Maternal Aunt        >50    Review of Systems  Musculoskeletal: Positive for back pain.       Knee pain     Exam:   BP 124/64   Pulse (!) 101   Temp 97.9 F (36.6 C)   Ht 5\' 2"  (1.575 m)   Wt 266 lb 9.6 oz (120.9 kg)   SpO2 98%   BMI 48.76 kg/m   Weight change: @WEIGHTCHANGE @ Height:   Height: 5\' 2"  (157.5 cm)  Ht Readings from Last 3 Encounters:  07/09/19 5\' 2"  (1.575 m)  05/31/19 5\' 4"  (1.626 m)  05/23/19 5\' 4"  (1.626 m)    General appearance: alert, cooperative and appears stated age Head: Normocephalic, without obvious abnormality, atraumatic Neck: no adenopathy, supple, symmetrical, trachea midline and thyroid normal to inspection and palpation Lungs: clear to auscultation bilaterally Cardiovascular: regular rate and rhythm Breasts: normal appearance, no masses or tenderness Abdomen: soft, non-tender; non distended,  no masses,  no organomegaly Extremities: extremities normal, atraumatic, no cyanosis or edema Skin: Skin color, texture, turgor normal. No rashes or lesions Lymph nodes: Cervical, supraclavicular, and axillary nodes normal. No abnormal inguinal nodes palpated Neurologic: Grossly normal   Pelvic: External genitalia:  no lesions              Urethra:  normal appearing urethra with no masses, tenderness or lesions              Bartholins and Skenes: normal                 Vagina: normal appearing vagina with normal color and discharge, no lesions. Grade 1-2 cystocele with valsalva               Cervix: absent               Bimanual Exam:  Uterus:  uterus absent              Adnexa: no mass, fullness,  tenderness               Rectovaginal: Confirms               Anus:  normal sphincter tone, no lesions  Terence Lux chaperoned for the exam.  A:  Well Woman with normal exam  Candida intertrigo  Cystocele, not bothersome  BMI 48, s/p gastric sleeve surgery several years ago  Chronic back pain.  P:   No pap needed  Labs with primary  Colonoscopy and mammogram UTD  Discussed breast self exam  Discussed calcium and vit D intake  Nystatin for prn use  Information on cystocele given   Addendum: under her breasts and panus the skin is darkened.

## 2019-07-09 NOTE — Patient Instructions (Addendum)
EXERCISE AND DIET:  We recommended that you start or continue a regular exercise program for good health. Regular exercise means any activity that makes your heart beat faster and makes you sweat.  We recommend exercising at least 30 minutes per day at least 3 days a week, preferably 4 or 5.  We also recommend a diet low in fat and sugar.  Inactivity, poor dietary choices and obesity can cause diabetes, heart attack, stroke, and kidney damage, among others.    ALCOHOL AND SMOKING:  Women should limit their alcohol intake to no more than 7 drinks/beers/glasses of wine (combined, not each!) per week. Moderation of alcohol intake to this level decreases your risk of breast cancer and liver damage. And of course, no recreational drugs are part of a healthy lifestyle.  And absolutely no smoking or even second hand smoke. Most people know smoking can cause heart and lung diseases, but did you know it also contributes to weakening of your bones? Aging of your skin?  Yellowing of your teeth and nails?  CALCIUM AND VITAMIN D:  Adequate intake of calcium and Vitamin D are recommended.  The recommendations for exact amounts of these supplements seem to change often, but generally speaking 1,200 mg of calcium (between diet and supplement) and 800 units of Vitamin D per day seems prudent. Certain women may benefit from higher intake of Vitamin D.  If you are among these women, your doctor will have told you during your visit.    PAP SMEARS:  Pap smears, to check for cervical cancer or precancers,  have traditionally been done yearly, although recent scientific advances have shown that most women can have pap smears less often.  However, every woman still should have a physical exam from her gynecologist every year. It will include a breast check, inspection of the vulva and vagina to check for abnormal growths or skin changes, a visual exam of the cervix, and then an exam to evaluate the size and shape of the uterus and  ovaries.  And after 59 years of age, a rectal exam is indicated to check for rectal cancers. We will also provide age appropriate advice regarding health maintenance, like when you should have certain vaccines, screening for sexually transmitted diseases, bone density testing, colonoscopy, mammograms, etc.   MAMMOGRAMS:  All women over 40 years old should have a yearly mammogram. Many facilities now offer a "3D" mammogram, which may cost around $50 extra out of pocket. If possible,  we recommend you accept the option to have the 3D mammogram performed.  It both reduces the number of women who will be called back for extra views which then turn out to be normal, and it is better than the routine mammogram at detecting truly abnormal areas.    COLON CANCER SCREENING: Now recommend starting at age 45. At this time colonoscopy is not covered for routine screening until 50. There are take home tests that can be done between 45-49.   COLONOSCOPY:  Colonoscopy to screen for colon cancer is recommended for all women at age 50.  We know, you hate the idea of the prep.  We agree, BUT, having colon cancer and not knowing it is worse!!  Colon cancer so often starts as a polyp that can be seen and removed at colonscopy, which can quite literally save your life!  And if your first colonoscopy is normal and you have no family history of colon cancer, most women don't have to have it again for   10 years.  Once every ten years, you can do something that may end up saving your life, right?  We will be happy to help you get it scheduled when you are ready.  Be sure to check your insurance coverage so you understand how much it will cost.  It may be covered as a preventative service at no cost, but you should check your particular policy.      Breast Self-Awareness Breast self-awareness means being familiar with how your breasts look and feel. It involves checking your breasts regularly and reporting any changes to your  health care provider. Practicing breast self-awareness is important. A change in your breasts can be a sign of a serious medical problem. Being familiar with how your breasts look and feel allows you to find any problems early, when treatment is more likely to be successful. All women should practice breast self-awareness, including women who have had breast implants. How to do a breast self-exam One way to learn what is normal for your breasts and whether your breasts are changing is to do a breast self-exam. To do a breast self-exam: Look for Changes  1. Remove all the clothing above your waist. 2. Stand in front of a mirror in a room with good lighting. 3. Put your hands on your hips. 4. Push your hands firmly downward. 5. Compare your breasts in the mirror. Look for differences between them (asymmetry), such as: ? Differences in shape. ? Differences in size. ? Puckers, dips, and bumps in one breast and not the other. 6. Look at each breast for changes in your skin, such as: ? Redness. ? Scaly areas. 7. Look for changes in your nipples, such as: ? Discharge. ? Bleeding. ? Dimpling. ? Redness. ? A change in position. Feel for Changes Carefully feel your breasts for lumps and changes. It is best to do this while lying on your back on the floor and again while sitting or standing in the shower or tub with soapy water on your skin. Feel each breast in the following way:  Place the arm on the side of the breast you are examining above your head.  Feel your breast with the other hand.  Start in the nipple area and make  inch (2 cm) overlapping circles to feel your breast. Use the pads of your three middle fingers to do this. Apply light pressure, then medium pressure, then firm pressure. The light pressure will allow you to feel the tissue closest to the skin. The medium pressure will allow you to feel the tissue that is a little deeper. The firm pressure will allow you to feel the tissue  close to the ribs.  Continue the overlapping circles, moving downward over the breast until you feel your ribs below your breast.  Move one finger-width toward the center of the body. Continue to use the  inch (2 cm) overlapping circles to feel your breast as you move slowly up toward your collarbone.  Continue the up and down exam using all three pressures until you reach your armpit.  Write Down What You Find  Write down what is normal for each breast and any changes that you find. Keep a written record with breast changes or normal findings for each breast. By writing this information down, you do not need to depend only on memory for size, tenderness, or location. Write down where you are in your menstrual cycle, if you are still menstruating. If you are having trouble noticing differences   in your breasts, do not get discouraged. With time you will become more familiar with the variations in your breasts and more comfortable with the exam. How often should I examine my breasts? Examine your breasts every month. If you are breastfeeding, the best time to examine your breasts is after a feeding or after using a breast pump. If you menstruate, the best time to examine your breasts is 5-7 days after your period is over. During your period, your breasts are lumpier, and it may be more difficult to notice changes. When should I see my health care provider? See your health care provider if you notice:  A change in shape or size of your breasts or nipples.  A change in the skin of your breast or nipples, such as a reddened or scaly area.  Unusual discharge from your nipples.  A lump or thick area that was not there before.  Pain in your breasts.  Anything that concerns you.  About Cystocele  Overview  The pelvic organs, including the bladder, are normally supported by pelvic floor muscles and ligaments.  When these muscles and ligaments are stretched, weakened or torn, the wall between  the bladder and the vagina sags or herniates causing a prolapse, sometimes called a cystocele.  This condition may cause discomfort and problems with emptying the bladder.  It can be present in various stages.  Some people are not aware of the changes.  Others may notice changes at the vaginal opening or a feeling of the bladder dropping outside the body.  Causes of a Cystocele  A cystocele is usually caused by muscle straining or stretching during childbirth.  In addition, cystocele is more common after menopause, because the hormone estrogen helps keep the elastic tissues around the pelvic organs strong.  A cystocele is more likely to occur when levels of estrogen decrease.  Other causes include: heavy lifting, chronic coughing, previous pelvic surgery and obesity.  Symptoms  A bladder that has dropped from its normal position may cause: unwanted urine leakage (stress incontinence), frequent urination or urge to urinate, incomplete emptying of the bladder (not feeling bladder relief after emptying), pain or discomfort in the vagina, pelvis, groin, lower back or lower abdomen and frequent urinary tract infections.  Mild cases may not cause any symptoms.  Treatment Options  Pelvic floor (Kegel) exercises:  Strength training the muscles in your genital area  Behavioral changes: Treating and preventing constipation, taking time to empty your bladder properly, learning to lift properly and/or avoid heavy lifting when possible, stopping smoking, avoiding weight gain and treating a chronic cough or bronchitis.  A pessary: A vaginal support device is sometimes used to help pelvic support caused by muscle and ligament changes.  Surgery: Surgical repair may be necessary if symptoms cannot be managed with exercise, behavioral changes and a pessary.  Surgery is usually considered for severe cases.   2007, [employer] Skin Yeast Infection  A skin yeast infection is a condition in which  there is an overgrowth of yeast (candida) that normally lives on the skin. This condition usually occurs in areas of the skin that are constantly warm and moist, such as the armpits or the groin. What are the causes? This condition is caused by a change in the normal balance of the yeast and bacteria that live on the skin. What increases the risk? You are more likely to develop this condition if you:  Are obese.  Are pregnant.  Take birth control pills.  Have  diabetes.  Take antibiotic medicines.  Take steroid medicines.  Are malnourished.  Have a weak body defense system (immune system).  Are 2 years of age or older.  Wear tight clothing. What are the signs or symptoms? The most common symptom of this condition is itchiness in the affected area. Other symptoms include:  Red, swollen area of the skin.  Bumps on the skin. How is this diagnosed?  This condition is diagnosed with a medical history and physical exam.  Your health care provider may check for yeast by taking light scrapings of the skin to be viewed under a microscope. How is this treated? This condition is treated with medicine. Medicines may be prescribed or be available over the counter. The medicines may be:  Taken by mouth (orally).  Applied as a cream or powder to your skin. Follow these instructions at home:   Take or apply over-the-counter and prescription medicines only as told by your health care provider.  Maintain a healthy weight. If you need help losing weight, talk with your health care provider.  Keep your skin clean and dry.  If you have diabetes, keep your blood sugar under control.  Keep all follow-up visits as told by your health care provider. This is important. Contact a health care provider if:  Your symptoms go away and then return.  Your symptoms do not get better with treatment.  Your symptoms get worse.  Your rash spreads.  You have a fever or chills.  You have  new symptoms.  You have new warmth or redness of your skin. Summary  A skin yeast infection is a condition in which there is an overgrowth of yeast (candida) that normally lives on the skin. This condition is caused by a change in the normal balance of the yeast and bacteria that live on the skin.  Take or apply over-the-counter and prescription medicines only as told by your health care provider.  Keep your skin clean and dry.  Contact a health care provider if your symptoms do not get better with treatment. This information is not intended to replace advice given to you by your health care provider. Make sure you discuss any questions you have with your health care provider. Document Revised: 05/31/2017 Document Reviewed: 05/31/2017 [employer] Patient Education  [place].

## 2019-07-10 DIAGNOSIS — Z0000 Encounter for general adult medical examination without abnormal findings: Secondary | ICD-10-CM | POA: Diagnosis not present

## 2019-07-11 ENCOUNTER — Ambulatory Visit: Payer: Self-pay

## 2019-07-11 ENCOUNTER — Encounter: Payer: Self-pay | Admitting: Physical Medicine and Rehabilitation

## 2019-07-11 ENCOUNTER — Other Ambulatory Visit: Payer: Self-pay

## 2019-07-11 ENCOUNTER — Ambulatory Visit (INDEPENDENT_AMBULATORY_CARE_PROVIDER_SITE_OTHER): Payer: 59 | Admitting: Physical Medicine and Rehabilitation

## 2019-07-11 VITALS — BP 117/80 | HR 67

## 2019-07-11 DIAGNOSIS — G894 Chronic pain syndrome: Secondary | ICD-10-CM

## 2019-07-11 DIAGNOSIS — M961 Postlaminectomy syndrome, not elsewhere classified: Secondary | ICD-10-CM

## 2019-07-11 DIAGNOSIS — M5416 Radiculopathy, lumbar region: Secondary | ICD-10-CM | POA: Diagnosis not present

## 2019-07-11 MED ORDER — CEPHALEXIN 500 MG PO CAPS: 500.0000 mg | ORAL_CAPSULE | Freq: Two times a day (BID) | ORAL | 0 refills | Status: DC

## 2019-07-11 NOTE — Procedures (Signed)
Advanced programming analysis performed with the [employer] technical representative with physician input and  monitoring. [employer] FAST and contour programming with  combined therapy initiated. 

## 2019-07-11 NOTE — Progress Notes (Signed)
[name] - 59 y.o. female MRN [id number]  Date of birth: 1961-01-18  Office Visit Note: Visit Date: 07/11/2019 PCP: Velna Hatchet, MD Referred by: Velna Hatchet, MD  Subjective: Chief Complaint  Patient presents with  . Lower Back - Pain   HPI: [name] is a 59 y.o. female who comes in today For planned spinal cord stimulator stimulator trial for chronic recalcitrant low back and bilateral buttock and leg pain.  Please see our prior evaluation notes for further details and justification.  She really has failed all manner of conservative nonconservative care including lumbar instrumented fusion from L3-L5.  After psychological 3 stimulator evaluation and preauthorization we are proceeding with the trial today.  Prophylactic dose of Keflex was prescribed.  Patient with penicillin allergy but not allergy to Keflex.  Pretrial planning shows small disc herniation at T9-10 with moderate narrowing of the canal.  6 inch introducer would be utilized and try to enter at T11-12.  Review of Systems  Musculoskeletal: Positive for back pain and joint pain.  All other systems reviewed and are negative.  Otherwise per HPI.  Assessment & Plan: Visit Diagnoses:  1. Lumbar radiculopathy   2. Post laminectomy syndrome   3. Chronic pain syndrome     Plan: Findings:  Successful trial lead placement as dictated in the procedure note.  Patient will return as scheduled for lead pull and determination of going forward with implant.    Meds & Orders:  Meds ordered this encounter  Medications  . cephALEXin (KEFLEX) 500 MG capsule    Sig: Take 1 capsule (500 mg total) by mouth 2 (two) times daily.    Dispense:  14 capsule    Refill:  0    Orders Placed This Encounter  Procedures  . Spinal Cord Stimulator - Analysis  . Spinal Cord Stimulator - Placement  . XR C-ARM NO REPORT    Follow-up: Return in about 1 week (around 07/18/2019) for Lead Pull.   Procedures: No  procedures performed  Spinal Cord Stimulator Trial  Patient: [name]      Date of Birth: [birth year] MRN: [id number] PCP: Velna Hatchet, MD      Visit Date: 07/11/2019   Universal Protocol:    Date/Time: 07/10/2108:13 AM  Consent Given By: the patient  Position: PRONE  Additional Comments: Vital signs were monitored before and after the procedure. Patient was prepped and draped in the usual sterile fashion. The correct patient, procedure, and site was verified.   Injection Procedure Details:  Procedure Site One Meds Administered:  Meds ordered this encounter  Medications  . cephALEXin (KEFLEX) 500 MG capsule    Sig: Take 1 capsule (500 mg total) by mouth 2 (two) times daily.    Dispense:  14 capsule    Refill:  0     Location/Site: Leads were placed midline at the upper mostly at the top of the T6 vertebral body just at the superior endplate and then spanning 3 vertebral bodies with the inferior lead at the top of the T9 vertebral body.   Needle size: 14 gauge   Needle type: 6 in. Standard Epidural Needle  Needle Placement: T12-L1 Dorsal epidural space  Findings:  -  -Comments: Excellent paresthesia coverage was obtained in all of the areas of the patient's normal pain  Procedure Details: After localization of the T12-L1 epidural space and the overlying soft tissue, the needle was introduced three bodies below this level to produce an adequate angle for epidural  penetration. The soft tissue was infiltrated with 2-3 mls. of 1% Lidocaine without Epinephrine. Using the posterolateral approach, the #14 gauge 6 in introducer needle was inserted down to the posterior aspect of the L1 lamina and then "walked off" the superior aspect of this lamina into the T12-L1 epidural space. The epidural space was localized with loss of resistance and negative aspirate for CSF or blood. The [employer] (16) electrode stimulation lead was passed up so that just  the superior most lead was at the location noted above while maintaining the lead in the midline.  Patient did have some nerve type sensations with turning the angle lead from left to right.  After several adjustments and moving through the more stenotic area around T9 the patient did well after that and could steer the lead without difficulty.   The pulse generator system was adjusted and programmed by myself and the company technical representative by varying the stimulator sites, rates, pulse amplitude, and duration. Adequate coverage was obtained as described above.  Subsequent to this, the introducer needle was removed and the spinal cord stimulator trial lead was fastened to the skin using steri-strips and strain reduction loop. The lead wire was then connected and Tegaderm was applied to produce an occlusive dressing. The patient was then brought out of the procedure room and was given a portable stimulator.  The patient will follow-up in three to seven days to determine whether this was efficacious.  Oral prophylactic antibiotic, Keflex,  was prescribed as noted.   Additional Comments:  The patient tolerated the procedure well Dressing: As noted above    Post-procedure details: Patient was observed during the procedure. Post-procedure instructions were reviewed.  Patient left the clinic in stable condition.     Advanced programming analysis performed with the [facility] with physician input and  monitoring.  [employer] FAST and Consulting civil engineer with  combined therapy initiated.     Clinical History: MRI LUMBAR SPINE WITHOUT CONTRAST  TECHNIQUE: Multiplanar, multisequence MR imaging of the lumbar spine was performed. No intravenous contrast was administered.  COMPARISON:  MRI of the lumbar spine without contrast 06/11/2014  FINDINGS: Segmentation: 5 non rib-bearing lumbar type vertebral bodies are present. The lowest fully  formed vertebral body is L5.  Alignment:  No significant listhesis or curvature is present.  Vertebrae: Marrow signal and vertebral body heights are maintained. T1 shortening at L5 suggests hemangioma. This is stable.  Conus medullaris and cauda equina: Conus extends to the L1-2 level. Conus and cauda equina appear normal.  Paraspinal and other soft tissues: Within normal limits  Disc levels:  T11-12: Mild disc bulging is present. There is no significant stenosis.  T12-L1: Mild disc bulging is present. No significant stenosis is present.  L1-2: Mild disc bulging is present. There is no significant stenosis. Mild facet hypertrophy is noted bilaterally.  L2-3: Advanced facet hypertrophy has progressed. A broad-based disc protrusion is noted. Mild central canal stenosis has progressed. Moderate bilateral foraminal stenosis has progressed, left greater than right.  L3-4: Wide laminectomy and solid fusion are stable. There is no recurrent stenosis.  L4-5: Wide laminectomy is present. No residual recurrent stenosis is present. Chronic posterior epidural fluid collection is stable, non-compressive.  L5-S1: Laminectomy is noted. No residual recurrent stenosis is present.  IMPRESSION: 1. Progressive adjacent level disease at L2-3 with mild central canal stenosis and moderate bilateral foraminal stenosis, left greater than right. 2. Stable fusion at L3-4, L4-5, and L5-S1 without significant residual  or recurrent stenosis at these levels.   Electronically Signed   By: San Morelle M.D.   On: 12/11/2018 15:00   She reports that she quit smoking about 28 years ago. Her smoking use included cigarettes. She has a 30.00 pack-year smoking history. She has never used smokeless tobacco. No results for input(s): HGBA1C, LABURIC in the last 8760 hours.  Objective:  VS:  HT:    WT:   BMI:     BP:117/80  HR:67bpm  TEMP: ( )  RESP:  Physical Exam Vitals and  nursing note reviewed.  Constitutional:      General: She is not in acute distress.    Appearance: Normal appearance. She is well-developed. She is obese. She is not ill-appearing.  HENT:     Head: Normocephalic and atraumatic.  Eyes:     Conjunctiva/sclera: Conjunctivae normal.     Pupils: Pupils are equal, round, and reactive to light.  Cardiovascular:     Rate and Rhythm: Normal rate.     Pulses: Normal pulses.  Pulmonary:     Effort: Pulmonary effort is normal.  Musculoskeletal:     Right lower leg: No edema.     Left lower leg: No edema.     Comments: Patient ambulates with a cane she is slow to rise from seated position she has good distal strength without clonus.  Skin:    General: Skin is warm and dry.     Findings: No erythema or rash.  Neurological:     General: No focal deficit present.     Mental Status: She is alert and oriented to person, place, and time.     Sensory: No sensory deficit.     Motor: No abnormal muscle tone.     Coordination: Coordination normal.     Gait: Gait normal.  Psychiatric:        Mood and Affect: Mood normal.        Behavior: Behavior normal.     Ortho Exam  Imaging: XR C-ARM NO REPORT  Result Date: 07/11/2019 Please see Notes tab for imaging impression.   Past Medical/Family/Surgical/Social History: Medications & Allergies reviewed per EMR, new medications updated. Patient Active Problem List   Diagnosis Date Noted  . Chronic pain syndrome 05/04/2019  . Lumbar radiculopathy 12/18/2018  . Spinal stenosis of lumbar region with neurogenic claudication 12/18/2018  . Post laminectomy syndrome 12/18/2018  . Bilateral primary osteoarthritis of knee 12/27/2016  . 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 Prediabetes 12/03/2015  . OSA on CPAP 02/19/2015  . Morbid obesity ([place]) 10/09/2014  . Pulmonary HTN ([place]) 05/12/2013  . Bilateral lower extremity edema 05/12/2013  . Morbid obesity with BMI of 50.0-59.9, adult  ([place])   . Back pain, hx of prior back surgery 12/09/2011  . Pancreatitis, mild, CT negative 12/08/2011  . Fatty liver 12/08/2011  . Substernal chest pain, suspected secondary to acute pancreatitis 12/06/2011  . Hypertension 12/06/2011  . Hypokalemia 12/06/2011  . Hypothyroidism 12/06/2011  . Anxiety 12/06/2011  . BV (bacterial vaginosis)   . Hyperlipidemia    Past Medical History:  Diagnosis Date  . Anemia when she was young  . Arthritis   . Congestion of nasal sinus   . Constipation    takes Colace every other day  . DDD (degenerative disc disease), lumbar   . Diabetes mellitus without complication ([place])    per pt had been diagnosed but then "number started going down so my doctor said i didnt have  diabetes"  . Dry eyes    uses Systane Eye drops daily as needed  . GERD (gastroesophageal reflux disease) 12/06/2011   takes Nexium daily  . Headache(784.0)    occasionally-d/t congestion  . Heart murmur   . History of bronchitis 6-7 yrs ago  . Hyperlipidemia   . Hypertension    takes Metoprolol and Diovan daily  . Hypothyroidism 12/06/2011   takes Synthroid daily  . Joint pain   . Multiple allergies    takes Zyrtec daily;uses Nasonex daily as needed  . Nocturia   . Numbness    weakness-right hand  . OSA on CPAP   . Pancreatitis   . Peripheral edema    takes Furosemide daily  . Shingles   . Urinary frequency   . Venous insufficiency of leg    Family History  Problem Relation Age of Onset  . Hypertension Mother   . Hypertension Father   . Hypertension Sister   . Kidney disease Sister        [facility] TRANSPLANT  . Thyroid disease Sister   . Thyroid disease Sister   . Hypertension Sister   . Breast cancer Maternal Aunt        >50; passed away from it  . Breast cancer Maternal Aunt        >50   Past Surgical History:  Procedure Laterality Date  . ABDOMINAL HYSTERECTOMY     uterine prolapse  . CARDIAC SURGERY    . CHOLECYSTECTOMY    . COLONOSCOPY    .  ESOPHAGOGASTRODUODENOSCOPY  5-10yrs ago  . FOOT SURGERY Bilateral   . KNEE ARTHROSCOPY    . LAPAROSCOPIC GASTRIC SLEEVE RESECTION N/A 08/23/2016   Procedure: LAPAROSCOPIC GASTRIC SLEEVE RESECTION WITH UPPER ENDO;  Surgeon: Johnathan Hausen, MD;  Location: [facility];  Service: General;  Laterality: N/A;  . POSTERIOR LUMBAR FUSION  03/10/2009   L3-4 and L3-4 fusion, Dr. Leeroy Cha  . SHOULDER ARTHROSCOPY WITH ROTATOR CUFF REPAIR AND SUBACROMIAL DECOMPRESSION Right 08/23/2013   Procedure: RIGHT SHOULDER ARTHROSCOPY WITH  SUBACROMIAL DECOMPRESSION DISTAL CLAVICLE RESECTION AND  ROTATOR CUFF REPAIR ;  Surgeon: Marin Shutter, MD;  Location: [facility];  Service: Orthopedics;  Laterality: Right;  . TUBAL LIGATION     Social History   Occupational History    Employer: [employer]    Comment: [employer]  Tobacco Use  . Smoking status: Former Smoker    Packs/day: 0.75    Years: 40.00    Pack years: 30.00    Types: Cigarettes    Quit date: 1993    Years since quitting: 28.4  . Smokeless tobacco: Never Used  . Tobacco comment: quit smoking in 1993  Vaping Use  . Vaping Use: Never used  Substance and Sexual Activity  . Alcohol use: Yes    Alcohol/week: 0.0 standard drinks    Comment: rarely;; mixed drink   . Drug use: No  . Sexual activity: Yes    Partners: Male    Birth control/protection: Surgical    Comment: Hysterectomy

## 2019-07-11 NOTE — Progress Notes (Signed)
Bilateral buttock and pain in posterior thighs. Right knee pain. Pain in both feet. Itching/ rash with adhesive and bandaids. Numeric Pain Rating Scale and Functional Assessment Average Pain 9   In the last MONTH (on 0-10 scale) has pain interfered with the following?  1. General activity like being  able to carry out your everyday physical activities such as walking, climbing stairs, carrying groceries, or moving a chair?  Rating(9)   +Driver, -BT, -Dye Allergies.

## 2019-07-11 NOTE — Procedures (Signed)
Spinal Cord Stimulator Trial  Patient: [name]      Date of Birth: 06-17-1960 MRN: [id number] PCP: Velna Hatchet, MD      Visit Date: 07/11/2019   Universal Protocol:    Date/Time: 07/10/2108:13 AM  Consent Given By: the patient  Position: PRONE  Additional Comments: Vital signs were monitored before and after the procedure. Patient was prepped and draped in the usual sterile fashion. The correct patient, procedure, and site was verified.   Injection Procedure Details:  Procedure Site One Meds Administered:  Meds ordered this encounter  Medications  . cephALEXin (KEFLEX) 500 MG capsule    Sig: Take 1 capsule (500 mg total) by mouth 2 (two) times daily.    Dispense:  14 capsule    Refill:  0     Location/Site: Leads were placed midline at the upper mostly at the top of the T6 vertebral body just at the superior endplate and then spanning 3 vertebral bodies with the inferior lead at the top of the T9 vertebral body.   Needle size: 14 gauge   Needle type: 6 in. Standard Epidural Needle  Needle Placement: T12-L1 Dorsal epidural space  Findings:  -  -Comments: Excellent paresthesia coverage was obtained in all of the areas of the patient's normal pain  Procedure Details: After localization of the T12-L1 epidural space and the overlying soft tissue, the needle was introduced three bodies below this level to produce an adequate angle for epidural penetration. The soft tissue was infiltrated with 2-3 mls. of 1% Lidocaine without Epinephrine. Using the posterolateral approach, the #14 gauge 6 in introducer needle was inserted down to the posterior aspect of the L1 lamina and then "walked off" the superior aspect of this lamina into the T12-L1 epidural space. The epidural space was localized with loss of resistance and negative aspirate for CSF or blood. The [employer] (16) electrode stimulation lead was passed up so that just the superior most lead  was at the location noted above while maintaining the lead in the midline.  Patient did have some nerve type sensations with turning the angle lead from left to right.  After several adjustments and moving through the more stenotic area around T9 the patient did well after that and could steer the lead without difficulty.   The pulse generator system was adjusted and programmed by myself and the company technical representative by varying the stimulator sites, rates, pulse amplitude, and duration. Adequate coverage was obtained as described above.  Subsequent to this, the introducer needle was removed and the spinal cord stimulator trial lead was fastened to the skin using steri-strips and strain reduction loop. The lead wire was then connected and Tegaderm was applied to produce an occlusive dressing. The patient was then brought out of the procedure room and was given a portable stimulator.  The patient will follow-up in three to seven days to determine whether this was efficacious.  Oral prophylactic antibiotic, Keflex,  was prescribed as noted.   Additional Comments:  The patient tolerated the procedure well Dressing: As noted above    Post-procedure details: Patient was observed during the procedure. Post-procedure instructions were reviewed.  Patient left the clinic in stable condition.

## 2019-07-12 ENCOUNTER — Ambulatory Visit: Payer: 59 | Admitting: Orthopaedic Surgery

## 2019-07-17 ENCOUNTER — Ambulatory Visit: Payer: Self-pay

## 2019-07-17 ENCOUNTER — Encounter: Payer: Self-pay | Admitting: Physical Medicine and Rehabilitation

## 2019-07-17 ENCOUNTER — Ambulatory Visit (INDEPENDENT_AMBULATORY_CARE_PROVIDER_SITE_OTHER): Payer: 59 | Admitting: Physical Medicine and Rehabilitation

## 2019-07-17 ENCOUNTER — Other Ambulatory Visit: Payer: Self-pay

## 2019-07-17 DIAGNOSIS — M5416 Radiculopathy, lumbar region: Secondary | ICD-10-CM

## 2019-07-17 DIAGNOSIS — G894 Chronic pain syndrome: Secondary | ICD-10-CM

## 2019-07-17 DIAGNOSIS — M961 Postlaminectomy syndrome, not elsewhere classified: Secondary | ICD-10-CM

## 2019-07-17 NOTE — Progress Notes (Signed)
Patient reports about 70% relief with trial.

## 2019-07-17 NOTE — Progress Notes (Signed)
[name] - 59 y.o. female MRN [id number]  Date of birth: 1960/01/27  Office Visit Note: Visit Date: 07/17/2019 PCP: Velna Hatchet, MD Referred by: Velna Hatchet, MD  Subjective: Chief Complaint  Patient presents with  . Lower Back - Pain   HPI: [name] is a 59 y.o. female who comes in today For spinal cord stimulator lead pull.  She reports overall being very happy with the relief of pain that she did receive during the trial.  She reports 70% relief but overall is very happy with the results.  She does want to proceed with permanent implant.  I did take x-rays today of her thoracic spine to see the placement of the leads.  Kindra from [employer] and reported the patient was feeling a little bit stimulation lower than where when she left but it was still adequate coverage.  It does appear from the picture that she did get some lead migration caudally about 1 vertebral body.  ROS Otherwise per HPI.  Assessment & Plan: Visit Diagnoses:  1. Lumbar radiculopathy   2. Post laminectomy syndrome   3. Chronic pain syndrome     Plan: Findings:  Patient reports 70% pain relief during the spinal cord stimulator trial which has been far above what she has had with injections and medication.  She does not proceed with permanent implant.  Referral was placed for this.  She did get small amount of lead migration caudally which I feel like is probably due to body habitus.  She still was able to get good stimulation.  Even though she reports 70% her ability she reports of being very happy with the results.    Meds & Orders: No orders of the defined types were placed in this encounter.   Orders Placed This Encounter  Procedures  . XR Lumbar Spine 2-3 Views  . Ambulatory referral to Anesthesiology    Follow-up: Return if symptoms worsen or fail to improve.   Procedures: Quick procedure note: Patient was placed prone on the exam table. The external  stimulator/generator was unfastened from the leads. The outer Tegaderm was removed from the bandage carefully. There was no noted erythema of the skin or swelling or induration or drainage. There had been some bloody discharge in the gauze pad. Both trial leads were pulled without difficulty and without discomfort. There was no drainage from the insertion site. Band-Aid was applied.  Clinical History: MRI LUMBAR SPINE WITHOUT CONTRAST  TECHNIQUE: Multiplanar, multisequence MR imaging of the lumbar spine was performed. No intravenous contrast was administered.  COMPARISON:  MRI of the lumbar spine without contrast 06/11/2014  FINDINGS: Segmentation: 5 non rib-bearing lumbar type vertebral bodies are present. The lowest fully formed vertebral body is L5.  Alignment:  No significant listhesis or curvature is present.  Vertebrae: Marrow signal and vertebral body heights are maintained. T1 shortening at L5 suggests hemangioma. This is stable.  Conus medullaris and cauda equina: Conus extends to the L1-2 level. Conus and cauda equina appear normal.  Paraspinal and other soft tissues: Within normal limits  Disc levels:  T11-12: Mild disc bulging is present. There is no significant stenosis.  T12-L1: Mild disc bulging is present. No significant stenosis is present.  L1-2: Mild disc bulging is present. There is no significant stenosis. Mild facet hypertrophy is noted bilaterally.  L2-3: Advanced facet hypertrophy has progressed. A broad-based disc protrusion is noted. Mild central canal stenosis has progressed. Moderate bilateral foraminal stenosis has progressed, left greater than  right.  L3-4: Wide laminectomy and solid fusion are stable. There is no recurrent stenosis.  L4-5: Wide laminectomy is present. No residual recurrent stenosis is present. Chronic posterior epidural fluid collection is stable, non-compressive.  L5-S1: Laminectomy is noted. No residual  recurrent stenosis is present.  IMPRESSION: 1. Progressive adjacent level disease at L2-3 with mild central canal stenosis and moderate bilateral foraminal stenosis, left greater than right. 2. Stable fusion at L3-4, L4-5, and L5-S1 without significant residual or recurrent stenosis at these levels.   Electronically Signed   By: San Morelle M.D.   On: 12/11/2018 15:00   She reports that she quit smoking about 28 years ago. Her smoking use included cigarettes. She has a 30.00 pack-year smoking history. She has never used smokeless tobacco. No results for input(s): HGBA1C, LABURIC in the last 8760 hours.  Objective:  VS:  HT:    WT:   BMI:     BP:   HR: bpm  TEMP: ( )  RESP:  Physical Exam  Ortho Exam  Imaging: No results found.  Past Medical/Family/Surgical/Social History: Medications & Allergies reviewed per EMR, new medications updated. Patient Active Problem List   Diagnosis Date Noted  . Chronic pain syndrome 05/04/2019  . Lumbar radiculopathy 12/18/2018  . Spinal stenosis of lumbar region with neurogenic claudication 12/18/2018  . Post laminectomy syndrome 12/18/2018  . Bilateral primary osteoarthritis of knee 12/27/2016  . 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 Prediabetes 12/03/2015  . OSA on CPAP 02/19/2015  . Morbid obesity ([place]) 10/09/2014  . Pulmonary HTN ([place]) 05/12/2013  . Bilateral lower extremity edema 05/12/2013  . Morbid obesity with BMI of 50.0-59.9, adult ([place])   . Back pain, hx of prior back surgery 12/09/2011  . Pancreatitis, mild, CT negative 12/08/2011  . Fatty liver 12/08/2011  . Substernal chest pain, suspected secondary to acute pancreatitis 12/06/2011  . Hypertension 12/06/2011  . Hypokalemia 12/06/2011  . Hypothyroidism 12/06/2011  . Anxiety 12/06/2011  . BV (bacterial vaginosis)   . Hyperlipidemia    Past Medical History:  Diagnosis Date  . Anemia when she was young  . Arthritis   . Congestion of  nasal sinus   . Constipation    takes Colace every other day  . DDD (degenerative disc disease), lumbar   . Diabetes mellitus without complication ([place])    per pt had been diagnosed but then "number started going down so my doctor said i didnt have diabetes"  . Dry eyes    uses Systane Eye drops daily as needed  . GERD (gastroesophageal reflux disease) 12/06/2011   takes Nexium daily  . Headache(784.0)    occasionally-d/t congestion  . Heart murmur   . History of bronchitis 6-7 yrs ago  . Hyperlipidemia   . Hypertension    takes Metoprolol and Diovan daily  . Hypothyroidism 12/06/2011   takes Synthroid daily  . Joint pain   . Multiple allergies    takes Zyrtec daily;uses Nasonex daily as needed  . Nocturia   . Numbness    weakness-right hand  . OSA on CPAP   . Pancreatitis   . Peripheral edema    takes Furosemide daily  . Shingles   . Urinary frequency   . Venous insufficiency of leg    Family History  Problem Relation Age of Onset  . Hypertension Mother   . Hypertension Father   . Hypertension Sister   . Kidney disease Sister        [employer] D[employer]  TRANSPLANT  . Thyroid disease Sister   . Thyroid disease Sister   . Hypertension Sister   . Breast cancer Maternal Aunt        >50; passed away from it  . Breast cancer Maternal Aunt        >50   Past Surgical History:  Procedure Laterality Date  . ABDOMINAL HYSTERECTOMY     uterine prolapse  . CARDIAC SURGERY    . CHOLECYSTECTOMY    . COLONOSCOPY    . ESOPHAGOGASTRODUODENOSCOPY  5-52yrs ago  . FOOT SURGERY Bilateral   . KNEE ARTHROSCOPY    . LAPAROSCOPIC GASTRIC SLEEVE RESECTION N/A 08/23/2016   Procedure: LAPAROSCOPIC GASTRIC SLEEVE RESECTION WITH UPPER ENDO;  Surgeon: Johnathan Hausen, MD;  Location: [facility];  Service: General;  Laterality: N/A;  . POSTERIOR LUMBAR FUSION  03/10/2009   L3-4 and L3-4 fusion, Dr. Leeroy Cha  . SHOULDER ARTHROSCOPY WITH ROTATOR CUFF REPAIR AND SUBACROMIAL DECOMPRESSION Right 08/23/2013     Procedure: RIGHT SHOULDER ARTHROSCOPY WITH  SUBACROMIAL DECOMPRESSION DISTAL CLAVICLE RESECTION AND  ROTATOR CUFF REPAIR ;  Surgeon: Marin Shutter, MD;  Location: [facility];  Service: Orthopedics;  Laterality: Right;  . TUBAL LIGATION     Social History   Occupational History    Employer: [employer]    Comment: [employer]  Tobacco Use  . Smoking status: Former Smoker    Packs/day: 0.75    Years: 40.00    Pack years: 30.00    Types: Cigarettes    Quit date: 1993    Years since quitting: 28.4  . Smokeless tobacco: Never Used  . Tobacco comment: quit smoking in 1993  Vaping Use  . Vaping Use: Never used  Substance and Sexual Activity  . Alcohol use: Yes    Alcohol/week: 0.0 standard drinks    Comment: rarely;; mixed drink   . Drug use: No  . Sexual activity: Yes    Partners: Male    Birth control/protection: Surgical    Comment: Hysterectomy

## 2019-07-18 ENCOUNTER — Encounter: Payer: Self-pay | Admitting: Orthopaedic Surgery

## 2019-07-18 ENCOUNTER — Ambulatory Visit (INDEPENDENT_AMBULATORY_CARE_PROVIDER_SITE_OTHER): Payer: 59 | Admitting: Orthopaedic Surgery

## 2019-07-18 VITALS — Ht 62.0 in

## 2019-07-18 DIAGNOSIS — M170 Bilateral primary osteoarthritis of knee: Secondary | ICD-10-CM

## 2019-07-18 DIAGNOSIS — M1711 Unilateral primary osteoarthritis, right knee: Secondary | ICD-10-CM | POA: Diagnosis not present

## 2019-07-18 MED ORDER — METHYLPREDNISOLONE ACETATE 40 MG/ML IJ SUSP
80.0000 mg | INTRAMUSCULAR | Status: AC | PRN
  Administered 2019-07-18: 80 mg via INTRA_ARTICULAR

## 2019-07-18 MED ORDER — BUPIVACAINE HCL 0.5 % IJ SOLN
2.0000 mL | INTRAMUSCULAR | Status: AC | PRN
Start: 2019-07-18 — End: 2019-07-18
  Administered 2019-07-18: 2 mL via INTRA_ARTICULAR

## 2019-07-18 MED ORDER — LIDOCAINE HCL 1 % IJ SOLN
2.0000 mL | INTRAMUSCULAR | Status: AC | PRN
  Administered 2019-07-18: 2 mL

## 2019-07-18 NOTE — Progress Notes (Signed)
Office Visit Note   Patient: [name]           Date of Birth: 02/20/1960           MRN: [id number] Visit Date: 07/18/2019              Requested by: Velna Hatchet, MD [address] [place],  [place] [address] PCP: Velna Hatchet, MD   Assessment & Plan: Visit Diagnoses:  1. Bilateral primary osteoarthritis of knee   2. Morbid obesity ([place])     Plan: [name] recently finished a course of Euflexxa in both knees.  She notes that it made a difference on the left but "not on the right".  I will inject the right knee with cortisone today.  Her BMI is approximately 48.  We had a long discussion regarding need for weight loss and consideration of both knee replacement in the future.  She is also being followed by Dr. Ernestina Patches for the issue with her back and is in the spinal stimulator trial  Follow-Up Instructions: Return if symptoms worsen or fail to improve.   Orders:  Orders Placed This Encounter  Procedures  . Large Joint Inj: R knee   No orders of the defined types were placed in this encounter.     Procedures: Large Joint Inj: R knee on 07/18/2019 3:46 PM Indications: pain and diagnostic evaluation Details: 25 G 1.5 in needle, anteromedial approach  Arthrogram: No  Medications: 2 mL lidocaine 1 %; 2 mL bupivacaine 0.5 %; 80 mg methylPREDNISolone acetate 40 MG/ML Procedure, treatment alternatives, risks and benefits explained, specific risks discussed. Consent was given by the patient. Immediately prior to procedure a time out was called to verify the correct patient, procedure, equipment, support staff and site/side marked as required. Patient was prepped and draped in the usual sterile fashion.       Clinical Data: No additional findings.   Subjective: Chief Complaint  Patient presents with  . Right Knee - Follow-up, Pain    She states that she can't tell if the euflexxa done any good for her.  She states that her pain is on medial side.  She states that it feel like it has fluid in the posterior side.    HPI  Review of Systems   Objective: Vital Signs: Ht 5\' 2"  (1.575 m)   BMI 48.76 kg/m   Physical Exam Constitutional:      Appearance: She is well-developed.  Eyes:     Pupils: Pupils are equal, round, and reactive to light.  Pulmonary:     Effort: Pulmonary effort is normal.  Skin:    General: Skin is warm and dry.  Neurological:     Mental Status: She is alert and oriented to person, place, and time.  Psychiatric:        Behavior: Behavior normal.     Ortho Exam large knees.  Having mostly trouble with her right knee with medial joint pain.  Difficult to know if there is an effusion.  Full extension flexed about 95 degrees without instability.  Some patellar crepitation.  No calf pain.  No obvious popliteal fullness Specialty Comments:  No specialty comments available.  Imaging: No results found.   PMFS History: Patient Active Problem List   Diagnosis Date Noted  . Chronic pain syndrome 05/04/2019  . Lumbar radiculopathy 12/18/2018  . Spinal stenosis of lumbar region with neurogenic claudication 12/18/2018  . Post laminectomy syndrome 12/18/2018  . Bilateral primary osteoarthritis of knee 12/27/2016  .  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 Prediabetes 12/03/2015  . OSA on CPAP 02/19/2015  . Morbid obesity ([place]) 10/09/2014  . Pulmonary HTN ([place]) 05/12/2013  . Bilateral lower extremity edema 05/12/2013  . Morbid obesity with BMI of 50.0-59.9, adult ([place])   . Back pain, hx of prior back surgery 12/09/2011  . Pancreatitis, mild, CT negative 12/08/2011  . Fatty liver 12/08/2011  . Substernal chest pain, suspected secondary to acute pancreatitis 12/06/2011  . Hypertension 12/06/2011  . Hypokalemia 12/06/2011  . Hypothyroidism 12/06/2011  . Anxiety 12/06/2011  . BV (bacterial vaginosis)   . Hyperlipidemia    Past Medical History:  Diagnosis Date  . Anemia when she was  young  . Arthritis   . Congestion of nasal sinus   . Constipation    takes Colace every other day  . DDD (degenerative disc disease), lumbar   . Diabetes mellitus without complication ([place])    per pt had been diagnosed but then "number started going down so my doctor said i didnt have diabetes"  . Dry eyes    uses Systane Eye drops daily as needed  . GERD (gastroesophageal reflux disease) 12/06/2011   takes Nexium daily  . Headache(784.0)    occasionally-d/t congestion  . Heart murmur   . History of bronchitis 6-7 yrs ago  . Hyperlipidemia   . Hypertension    takes Metoprolol and Diovan daily  . Hypothyroidism 12/06/2011   takes Synthroid daily  . Joint pain   . Multiple allergies    takes Zyrtec daily;uses Nasonex daily as needed  . Nocturia   . Numbness    weakness-right hand  . OSA on CPAP   . Pancreatitis   . Peripheral edema    takes Furosemide daily  . Shingles   . Urinary frequency   . Venous insufficiency of leg     Family History  Problem Relation Age of Onset  . Hypertension Mother   . Hypertension Father   . Hypertension Sister   . Kidney disease Sister        [facility] TRANSPLANT  . Thyroid disease Sister   . Thyroid disease Sister   . Hypertension Sister   . Breast cancer Maternal Aunt        >50; passed away from it  . Breast cancer Maternal Aunt        >50    Past Surgical History:  Procedure Laterality Date  . ABDOMINAL HYSTERECTOMY     uterine prolapse  . CARDIAC SURGERY    . CHOLECYSTECTOMY    . COLONOSCOPY    . ESOPHAGOGASTRODUODENOSCOPY  5-20yrs ago  . FOOT SURGERY Bilateral   . KNEE ARTHROSCOPY    . LAPAROSCOPIC GASTRIC SLEEVE RESECTION N/A 08/23/2016   Procedure: LAPAROSCOPIC GASTRIC SLEEVE RESECTION WITH UPPER ENDO;  Surgeon: Johnathan Hausen, MD;  Location: [facility];  Service: General;  Laterality: N/A;  . POSTERIOR LUMBAR FUSION  03/10/2009   L3-4 and L3-4 fusion, Dr. Leeroy Cha  . SHOULDER ARTHROSCOPY WITH ROTATOR CUFF REPAIR AND  SUBACROMIAL DECOMPRESSION Right 08/23/2013   Procedure: RIGHT SHOULDER ARTHROSCOPY WITH  SUBACROMIAL DECOMPRESSION DISTAL CLAVICLE RESECTION AND  ROTATOR CUFF REPAIR ;  Surgeon: Marin Shutter, MD;  Location: [facility];  Service: Orthopedics;  Laterality: Right;  . TUBAL LIGATION     Social History   Occupational History    Employer: [employer]    Comment: [employer]  Tobacco Use  . Smoking status: Former Smoker    Packs/day: 0.75  Years: 40.00    Pack years: 30.00    Types: Cigarettes    Quit date: 61    Years since quitting: 28.4  . Smokeless tobacco: Never Used  . Tobacco comment: quit smoking in 1993  Vaping Use  . Vaping Use: Never used  Substance and Sexual Activity  . Alcohol use: Yes    Alcohol/week: 0.0 standard drinks    Comment: rarely;; mixed drink   . Drug use: No  . Sexual activity: Yes    Partners: Male    Birth control/protection: Surgical    Comment: Hysterectomy     Garald Balding, MD   Note - This record has been created using [employer].  Chart creation errors have been sought, but may not always  have been located. Such creation errors do not reflect on  the standard of medical care.

## 2019-07-23 DIAGNOSIS — E785 Hyperlipidemia, unspecified: Secondary | ICD-10-CM | POA: Diagnosis not present

## 2019-07-23 DIAGNOSIS — R918 Other nonspecific abnormal finding of lung field: Secondary | ICD-10-CM | POA: Diagnosis not present

## 2019-07-23 DIAGNOSIS — Z0000 Encounter for general adult medical examination without abnormal findings: Secondary | ICD-10-CM | POA: Diagnosis not present

## 2019-07-23 DIAGNOSIS — K219 Gastro-esophageal reflux disease without esophagitis: Secondary | ICD-10-CM | POA: Diagnosis not present

## 2019-07-23 DIAGNOSIS — Z1331 Encounter for screening for depression: Secondary | ICD-10-CM | POA: Diagnosis not present

## 2019-07-23 DIAGNOSIS — G4733 Obstructive sleep apnea (adult) (pediatric): Secondary | ICD-10-CM | POA: Diagnosis not present

## 2019-07-23 DIAGNOSIS — E039 Hypothyroidism, unspecified: Secondary | ICD-10-CM | POA: Diagnosis not present

## 2019-07-23 DIAGNOSIS — E1169 Type 2 diabetes mellitus with other specified complication: Secondary | ICD-10-CM | POA: Diagnosis not present

## 2019-07-23 DIAGNOSIS — I10 Essential (primary) hypertension: Secondary | ICD-10-CM | POA: Diagnosis not present

## 2019-08-02 ENCOUNTER — Other Ambulatory Visit: Payer: Self-pay | Admitting: Cardiovascular Disease

## 2019-08-02 ENCOUNTER — Other Ambulatory Visit (HOSPITAL_COMMUNITY): Payer: Self-pay | Admitting: Internal Medicine

## 2019-08-07 DIAGNOSIS — I10 Essential (primary) hypertension: Secondary | ICD-10-CM | POA: Diagnosis not present

## 2019-08-07 DIAGNOSIS — M961 Postlaminectomy syndrome, not elsewhere classified: Secondary | ICD-10-CM | POA: Diagnosis not present

## 2019-08-07 DIAGNOSIS — Z6841 Body Mass Index (BMI) 40.0 and over, adult: Secondary | ICD-10-CM | POA: Diagnosis not present

## 2019-08-10 DIAGNOSIS — G894 Chronic pain syndrome: Secondary | ICD-10-CM | POA: Diagnosis not present

## 2019-08-10 DIAGNOSIS — M961 Postlaminectomy syndrome, not elsewhere classified: Secondary | ICD-10-CM | POA: Diagnosis not present

## 2019-08-20 ENCOUNTER — Other Ambulatory Visit: Payer: Self-pay | Admitting: Physical Medicine and Rehabilitation

## 2019-08-20 NOTE — Telephone Encounter (Signed)
Please advise 

## 2019-09-06 ENCOUNTER — Other Ambulatory Visit: Payer: Self-pay

## 2019-09-06 ENCOUNTER — Encounter: Payer: Self-pay | Admitting: Cardiovascular Disease

## 2019-09-06 ENCOUNTER — Ambulatory Visit (INDEPENDENT_AMBULATORY_CARE_PROVIDER_SITE_OTHER): Payer: 59 | Admitting: Cardiovascular Disease

## 2019-09-06 VITALS — BP 157/86 | HR 97 | Ht 63.0 in | Wt 264.8 lb

## 2019-09-06 DIAGNOSIS — I10 Essential (primary) hypertension: Secondary | ICD-10-CM | POA: Diagnosis not present

## 2019-09-06 DIAGNOSIS — G894 Chronic pain syndrome: Secondary | ICD-10-CM

## 2019-09-06 NOTE — Progress Notes (Signed)
Patient ID: [name], female   DOB: [birth year], 59 y.o.   MRN: [id number]      Cardiology Office Note   Date:  09/07/2019   ID:  [name], DOB 09/07/1960, MRN [id number]  PCP:  Velna Hatchet, MD  Cardiologist:   Sanda Klein, MD   Chief Complaint  Patient presents with  . Hypertension      History of Present Illness: [name] is a 59 y.o. female who presents for  Follow-up for hypertension and hyperlipidemia in the setting of morbid obesity.  She is generally doing well without any cardiovascular complaints.  She had labs with Dr. Ardeth Perfect at [facility].  She recently had a lumbar spine stimulator implanted for chronic pain.  She walks with a cane due to pain in her left knee.  Her blood pressure is quite high today and is not clear whether this is related to pain.  She is not feeling any worse today than usual.  The patient specifically denies any chest pain at rest exertion, dyspnea at rest or with exertion, orthopnea, paroxysmal nocturnal dyspnea, syncope, palpitations, focal neurological deficits, intermittent claudication, lower extremity edema, unexplained weight gain, cough, hemoptysis or wheezing.  She occasionally requires furosemide for lower extremity swelling.  On the average she takes it about 6 times a month.  She has not taken any in the last 2 weeks.  She has a long-time history of hyperlipidemia and hypertension and has super morbid obesity, despite about 30 lb weight loss after gastric sleeve bariatric surgery. She underwent coronary angiography in 2007 and did not have evidence of coronary artery disease. 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 In 2013 she was hospitalized with chest pain, apparently related to drug-induced pancreatitis (metronidazole). In 2015 she underwent an echocardiogram that is essentially normal except for mild diastolic dysfunction.  She had a cholecystectomy in her 70s and steatohepatitis by imaging studies. She has gastroesophageal reflux disease which is occasionally symptomatic. She has multiple arthralgias and needs to take a nonsteroidal anti-inflammatory drug on a regular basis. She has chronic low back pain despite lumbar spine surgery in 2012. She had a lumbar spine stimulator implanted in July 2021.  Past Medical History:  Diagnosis Date  . Anemia when she was young  . Arthritis   . Congestion of nasal sinus   . Constipation    takes Colace every other day  . DDD (degenerative disc disease), lumbar   . Diabetes mellitus without complication ([place])    per pt had been diagnosed but then "number started going down so my doctor said i didnt have diabetes"  . Dry eyes    uses Systane Eye drops daily as needed  . GERD (gastroesophageal reflux disease) 12/06/2011   takes Nexium daily  . Headache(784.0)    occasionally-d/t congestion  . Heart murmur   . History of bronchitis 6-7 yrs ago  . Hyperlipidemia   . Hypertension    takes Metoprolol and Diovan daily  . Hypothyroidism 12/06/2011   takes Synthroid daily  . Joint pain   . Multiple allergies    takes Zyrtec daily;uses Nasonex daily as needed  . Nocturia   . Numbness    weakness-right hand  . OSA on CPAP   . Pancreatitis   . Peripheral edema    takes Furosemide daily  . Shingles   . Urinary frequency   . Venous insufficiency of leg     Past Surgical  History:  Procedure Laterality Date  . ABDOMINAL HYSTERECTOMY     uterine prolapse  . CARDIAC SURGERY    . CHOLECYSTECTOMY    . COLONOSCOPY    . ESOPHAGOGASTRODUODENOSCOPY  5-97yrs ago  . FOOT SURGERY Bilateral   . KNEE ARTHROSCOPY    . LAPAROSCOPIC GASTRIC SLEEVE RESECTION N/A 08/23/2016   Procedure: LAPAROSCOPIC GASTRIC SLEEVE RESECTION WITH UPPER ENDO;  Surgeon: Johnathan Hausen, MD;  Location: [facility];  Service: General;  Laterality: N/A;  . POSTERIOR LUMBAR FUSION  03/10/2009   L3-4 and L3-4  fusion, Dr. Leeroy Cha  . SHOULDER ARTHROSCOPY WITH ROTATOR CUFF REPAIR AND SUBACROMIAL DECOMPRESSION Right 08/23/2013   Procedure: RIGHT SHOULDER ARTHROSCOPY WITH  SUBACROMIAL DECOMPRESSION DISTAL CLAVICLE RESECTION AND  ROTATOR CUFF REPAIR ;  Surgeon: Marin Shutter, MD;  Location: [facility];  Service: Orthopedics;  Laterality: Right;  . TUBAL LIGATION       Current Outpatient Medications  Medication Sig Dispense Refill  . albuterol (PROVENTIL HFA;VENTOLIN HFA) 108 (90 BASE) MCG/ACT inhaler Inhale 1-2 puffs into the lungs every 6 (six) hours as needed for wheezing or shortness of breath. 1 Inhaler 2  . aspirin EC 81 MG tablet Take 81 mg by mouth daily.     . Azelastine HCl 137 MCG/SPRAY SOLN   3  . baclofen (LIORESAL) 10 MG tablet TAKE 1/2 TO 1 TABLET BY MOUTH EVERY 8 HOURS AS NEEDED FOR SPASMS 60 tablet 0  . betamethasone valerate ointment (VALISONE) 0.1 % Apply a pea sized amount topically BID for up to 2 weeks. Not for daily long term use. 30 g 0  . cephALEXin (KEFLEX) 500 MG capsule Take 1 capsule (500 mg total) by mouth 2 (two) times daily. 14 capsule 0  . diazepam (VALIUM) 5 MG tablet Take 0.5-1 tablets (2.5-5 mg total) by mouth every 6 (six) hours as needed for muscle spasms or sedation. 40 tablet 1  . diazepam (VALIUM) 5 MG tablet Take 1 by mouth 1 hour  pre-procedure with very light food. May bring 2nd tablet to appointment. 2 tablet 0  . esomeprazole (NEXIUM) 40 MG capsule Take 40 mg by mouth daily.     . fluticasone (FLONASE) 50 MCG/ACT nasal spray Place 1 spray into both nostrils daily as needed for allergies.   1  . furosemide (LASIX) 20 MG tablet Take 3 tablets (60 mg total) by mouth as needed. NEED OV. 90 tablet 3  . gabapentin (NEURONTIN) 300 MG capsule Take 1 capsule (300 mg total) by mouth 3 (three) times daily. 270 capsule 1  . ibuprofen (ADVIL,MOTRIN) 600 MG tablet   0  . levothyroxine (SYNTHROID, LEVOTHROID) 75 MCG tablet Take 75 mcg by mouth daily before breakfast.    .  LINZESS 72 MCG capsule Take 72 mcg by mouth daily.    Marland Kitchen lisinopril (ZESTRIL) 20 MG tablet Take 1 tablet (20 mg total) by mouth daily. 90 tablet 0  . loratadine (CLARITIN) 10 MG tablet Take 10 mg by mouth daily.    . magnesium oxide (MAG-OX) 400 MG tablet Take 400 mg by mouth daily.    . meloxicam (MOBIC) 15 MG tablet   3  . methocarbamol (ROBAXIN) 500 MG tablet TAKE 1 TABLET BY MOUTH EVERY 8 HOURS AS NEEDED FOR MUSCLE SPASMS 60 tablet 0  . metoprolol tartrate (LOPRESSOR) 25 MG tablet Take 25 mg by mouth 2 (two) times daily.    Marland Kitchen nystatin cream (MYCOSTATIN) Apply 1 application topically 2 (two) times daily. Apply to affected area BID  for up to 7 days. 30 g 1  . Omega-3 Fatty Acids (FISH OIL) 1000 MG CAPS Take 1,000 mg by mouth daily.    . ondansetron (ZOFRAN) 4 MG tablet Take 1 tablet (4 mg total) by mouth every 8 (eight) hours as needed for nausea or vomiting. 20 tablet 0  . vitamin C (ASCORBIC ACID) 500 MG tablet Take 500 mg by mouth daily.    Marland Kitchen VITAMIN D PO Take by mouth.    . potassium chloride SA (K-DUR) 20 MEQ tablet TAKE 1 TABLET (20 MEQ TOTAL) BY MOUTH DAILY. 90 tablet 1   No current facility-administered medications for this visit.    Allergies:   Metronidazole, Adhesive [tape], Latex, Penicillins, and Vesicare [solifenacin]    Social History:  The patient  reports that she quit smoking about 28 years ago. Her smoking use included cigarettes. She has a 30.00 pack-year smoking history. She has never used smokeless tobacco. She reports current alcohol use. She reports that she does not use drugs.   Family History:  The patient's family history includes Breast cancer in her maternal aunt and maternal aunt; Hypertension in her father, mother, sister, and sister; Kidney disease in her sister; Thyroid disease in her sister and sister.    ROS:  Please see the history of present illness.    All other systems are reviewed and are negative.   PHYSICAL EXAM: VS:  BP (!) 157/86   Pulse 97    Ht 5\' 3"  (1.6 m)   Wt 264 lb 12.8 oz (120.1 kg)   SpO2 97%   BMI 46.91 kg/m    General: Alert, oriented x3, no distress, morbidly obese Head: no evidence of trauma, PERRL, EOMI, no exophtalmos or lid lag, no myxedema, no xanthelasma; normal ears, nose and oropharynx Neck: normal jugular venous pulsations and no hepatojugular reflux; brisk carotid pulses without delay and no carotid bruits Chest: clear to auscultation, no signs of consolidation by percussion or palpation, normal fremitus, symmetrical and full respiratory excursions Cardiovascular: normal position and quality of the apical impulse, regular rhythm, normal first and second heart sounds, no murmurs, rubs or gallops Abdomen: no tenderness or distention, no masses by palpation, no abnormal pulsatility or arterial bruits, normal bowel sounds, no hepatosplenomegaly Extremities: no clubbing, cyanosis or edema; 2+ radial, ulnar and brachial pulses bilaterally; 2+ right femoral, posterior tibial and dorsalis pedis pulses; 2+ left femoral, posterior tibial and dorsalis pedis pulses; no subclavian or femoral bruits Neurological: grossly nonfocal Psych: Normal mood and affect  EKG:  EKG  ordered today.  It shows normal sinus rhythm, nonspecific T wave inversion in leads V2-V5, otherwise normal.  QTc 450 ms   Recent Labs: No results found for requested labs within last 8760 hours.  09/15/2018 TSH 2.24, normal liver function tests 10/14/2017 hemoglobin 12.2, creatinine 0.7, potassium 4.4  Lipid Panel    Component Value Date/Time   CHOL 167 06/19/2013 0813   TRIG 115 06/19/2013 0813   HDL 60 06/19/2013 0813   CHOLHDL 2.8 06/19/2013 0813   VLDL 23 06/19/2013 0813   LDLCALC 84 06/19/2013 0813  06/08/2017 total cholesterol 203, HDL 83, LDL 103, triglycerides 87    Wt Readings from Last 3 Encounters:  09/06/19 264 lb 12.8 oz (120.1 kg)  07/09/19 266 lb 9.6 oz (120.9 kg)  05/31/19 263 lb (119.3 kg)     ASSESSMENT AND PLAN:  1.  Essential hypertension   2. Morbid obesity ([place])   3. Chronic pain syndrome  1. HTN: Blood pressure is too high today.  We will have our clinical pharmacist check her blood pressure for a few more times next week before we decide whether we should change her antihypertensive regimen.  It is possible that pain from her back and knee are interfering with an accurate evaluation of her resting blood pressure.  She has not had problems with edema and has not taken furosemide in the last 2 weeks.  A regular dose of diuretic is probably the next appropriate addition to her antihypertensive regimen which already contains lisinopril and metoprolol.  Calcium channel blockers are likely to worsen the edema. 2.  Morbid obesity: Despite previous gastric sleeve surgery.  Encourage renewed attempts to restrict calories, especially carbohydrates and saturated fat, since she is unable to exercise. 3.  Chronic back pain and left knee pain    Current medicines are reviewed at length with the patient today.  The patient does not have concerns regarding medicines.  The following changes have been made:  no change  Labs/ tests ordered today include:   Orders Placed This Encounter  Procedures  . EKG 12-Lead    Patient Instructions  Medication Instructions:  No changes *If you need a refill on your cardiac medications before your next appointment, please call your pharmacy*   Lab Work: None ordered If you have labs (blood work) drawn today and your tests are completely normal, you will receive your results only by: Marland Kitchen MyChart Message (if you have MyChart) OR . A paper copy in the mail If you have any lab test that is abnormal or we need to change your treatment, we will call you to review the results.   Testing/Procedures: None ordered   Follow-Up: At [facility], you and your health needs are our priority.  As part of our continuing mission to provide you with exceptional heart care, we have  created designated Provider Care Teams.  These Care Teams include your primary Cardiologist (physician) and Advanced Practice Providers (APPs -  Physician Assistants and Nurse Practitioners) who all work together to provide you with the care you need, when you need it.  We recommend signing up for the patient portal called "MyChart".  Sign up information is provided on this After Visit Summary.  MyChart is used to connect with patients for Virtual Visits (Telemedicine).  Patients are able to view lab/test results, encounter notes, upcoming appointments, etc.  Non-urgent messages can be sent to your provider as well.   To learn more about what you can do with MyChart, go to [email or web address].    Other Instructions Please check your blood pressure 3-4 times next week and let Dr. Sallyanne Kuster know the results. Adjustments may be made accordingly.     Signed, Sanda Klein, MD  09/07/2019 9:31 PM    Sanda Klein, MD, [facility] [phone] office [phone] pager

## 2019-09-06 NOTE — Patient Instructions (Signed)
Medication Instructions:  No changes *If you need a refill on your cardiac medications before your next appointment, please call your pharmacy*   Lab Work: None ordered If you have labs (blood work) drawn today and your tests are completely normal, you will receive your results only by: Marland Kitchen MyChart Message (if you have MyChart) OR . A paper copy in the mail If you have any lab test that is abnormal or we need to change your treatment, we will call you to review the results.   Testing/Procedures: None ordered   Follow-Up: At [facility], you and your health needs are our priority.  As part of our continuing mission to provide you with exceptional heart care, we have created designated Provider Care Teams.  These Care Teams include your primary Cardiologist (physician) and Advanced Practice Providers (APPs -  Physician Assistants and Nurse Practitioners) who all work together to provide you with the care you need, when you need it.  We recommend signing up for the patient portal called "MyChart".  Sign up information is provided on this After Visit Summary.  MyChart is used to connect with patients for Virtual Visits (Telemedicine).  Patients are able to view lab/test results, encounter notes, upcoming appointments, etc.  Non-urgent messages can be sent to your provider as well.   To learn more about what you can do with MyChart, go to [email or web address].    Other Instructions Please check your blood pressure 3-4 times next week and let Dr. Sallyanne Kuster know the results. Adjustments may be made accordingly.

## 2019-09-07 ENCOUNTER — Encounter: Payer: Self-pay | Admitting: Cardiovascular Disease

## 2019-09-10 DIAGNOSIS — E119 Type 2 diabetes mellitus without complications: Secondary | ICD-10-CM | POA: Diagnosis not present

## 2019-09-10 DIAGNOSIS — G4733 Obstructive sleep apnea (adult) (pediatric): Secondary | ICD-10-CM | POA: Diagnosis not present

## 2019-09-10 DIAGNOSIS — Z1152 Encounter for screening for COVID-19: Secondary | ICD-10-CM | POA: Diagnosis not present

## 2019-09-10 DIAGNOSIS — J019 Acute sinusitis, unspecified: Secondary | ICD-10-CM | POA: Diagnosis not present

## 2019-09-10 DIAGNOSIS — R05 Cough: Secondary | ICD-10-CM | POA: Diagnosis not present

## 2019-09-11 DIAGNOSIS — Z9689 Presence of other specified functional implants: Secondary | ICD-10-CM | POA: Diagnosis not present

## 2019-09-20 DIAGNOSIS — Z9689 Presence of other specified functional implants: Secondary | ICD-10-CM | POA: Diagnosis not present

## 2019-09-21 DIAGNOSIS — G4733 Obstructive sleep apnea (adult) (pediatric): Secondary | ICD-10-CM | POA: Diagnosis not present

## 2019-09-25 ENCOUNTER — Other Ambulatory Visit: Payer: Self-pay | Admitting: Physical Medicine and Rehabilitation

## 2019-09-25 NOTE — Telephone Encounter (Signed)
Please advise 

## 2019-10-02 ENCOUNTER — Other Ambulatory Visit: Payer: Self-pay

## 2019-10-02 ENCOUNTER — Encounter: Payer: Self-pay | Admitting: Orthopaedic Surgery

## 2019-10-02 ENCOUNTER — Ambulatory Visit (INDEPENDENT_AMBULATORY_CARE_PROVIDER_SITE_OTHER): Payer: 59 | Admitting: Orthopaedic Surgery

## 2019-10-02 VITALS — Ht 62.0 in | Wt 262.0 lb

## 2019-10-02 DIAGNOSIS — M170 Bilateral primary osteoarthritis of knee: Secondary | ICD-10-CM

## 2019-10-02 DIAGNOSIS — M1711 Unilateral primary osteoarthritis, right knee: Secondary | ICD-10-CM | POA: Diagnosis not present

## 2019-10-02 MED ORDER — LIDOCAINE HCL 1 % IJ SOLN
2.0000 mL | INTRAMUSCULAR | Status: AC | PRN
  Administered 2019-10-02: 2 mL

## 2019-10-02 MED ORDER — BUPIVACAINE HCL 0.5 % IJ SOLN
2.0000 mL | INTRAMUSCULAR | Status: AC | PRN
  Administered 2019-10-02: 2 mL via INTRA_ARTICULAR

## 2019-10-02 MED ORDER — METHYLPREDNISOLONE ACETATE 40 MG/ML IJ SUSP
80.0000 mg | INTRAMUSCULAR | Status: AC | PRN
  Administered 2019-10-02: 80 mg via INTRA_ARTICULAR

## 2019-10-02 NOTE — Progress Notes (Signed)
Office Visit Note   Patient: [name]           Date of Birth: March 13, 1960           MRN: [id number] Visit Date: 10/02/2019              Requested by: Velna Hatchet, MD [address] [place],  [place] [address] PCP: Velna Hatchet, MD   Assessment & Plan: Visit Diagnoses:  1. Bilateral primary osteoarthritis of knee     Plan: Advanced osteoarthritis both knees.  [name] has had a course of viscosupplementation bilaterally with minimal relief.  She like a cortisone injection in her right knee today and like to follow-up in 2 weeks to inject the left knee.  She does have advanced osteoarthritis but has a BMI of 48.  She is working on her weight Follow-Up Instructions: Return if symptoms worsen or fail to improve.   Orders:  Orders Placed This Encounter  Procedures  . Large Joint Inj: R knee   No orders of the defined types were placed in this encounter.     Procedures: Large Joint Inj: R knee on 10/02/2019 3:40 PM Indications: pain and diagnostic evaluation Details: 25 G 1.5 in needle, anteromedial approach  Arthrogram: No  Medications: 2 mL lidocaine 1 %; 2 mL bupivacaine 0.5 %; 80 mg methylPREDNISolone acetate 40 MG/ML Procedure, treatment alternatives, risks and benefits explained, specific risks discussed. Consent was given by the patient. Immediately prior to procedure a time out was called to verify the correct patient, procedure, equipment, support staff and site/side marked as required. Patient was prepped and draped in the usual sterile fashion.       Clinical Data: No additional findings.   Subjective: Chief Complaint  Patient presents with  . Right Knee - Pain  Patient presents today for her right knee recurrent pain. She was last seen on 07/18/2019 and received a cortisone injection. Patient states that the injection helped for about a month and then the pain returned. Her pain is worse medially. She has tried Tylenol and over the  counter rubs, but nothing really seems to help. She is wanting to get another cortisone injection today.   HPI  Review of Systems   Objective: Vital Signs: Ht 5\' 2"  (1.575 m)   Wt 262 lb (118.8 kg)   BMI 47.92 kg/m   Physical Exam  Ortho Exam awake alert and oriented x3.  Comfortable sitting right knee without obvious effusion but does have large knees.  Mostly medial joint pain.  Full extension flexed about 9095 degrees without instability.  Some patella crepitation but none with patella compression.  No lateral joint pain.  No calf discomfort  Specialty Comments:  No specialty comments available.  Imaging: No results found.   PMFS History: Patient Active Problem List   Diagnosis Date Noted  . Chronic pain syndrome 05/04/2019  . Lumbar radiculopathy 12/18/2018  . Spinal stenosis of lumbar region with neurogenic claudication 12/18/2018  . Post laminectomy syndrome 12/18/2018  . Bilateral primary osteoarthritis of knee 12/27/2016  . 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 Prediabetes 12/03/2015  . OSA on CPAP 02/19/2015  . Morbid obesity ([place]) 10/09/2014  . Pulmonary HTN ([place]) 05/12/2013  . Bilateral lower extremity edema 05/12/2013  . Morbid obesity with BMI of 50.0-59.9, adult ([place])   . Back pain, hx of prior back surgery 12/09/2011  . Pancreatitis, mild, CT negative 12/08/2011  . Fatty liver 12/08/2011  . Substernal chest pain, suspected  secondary to acute pancreatitis 12/06/2011  . Hypertension 12/06/2011  . Hypokalemia 12/06/2011  . Hypothyroidism 12/06/2011  . Anxiety 12/06/2011  . BV (bacterial vaginosis)   . Hyperlipidemia    Past Medical History:  Diagnosis Date  . Anemia when she was young  . Arthritis   . Congestion of nasal sinus   . Constipation    takes Colace every other day  . DDD (degenerative disc disease), lumbar   . Diabetes mellitus without complication ([place])    per pt had been diagnosed but then "number started going  down so my doctor said i didnt have diabetes"  . Dry eyes    uses Systane Eye drops daily as needed  . GERD (gastroesophageal reflux disease) 12/06/2011   takes Nexium daily  . Headache(784.0)    occasionally-d/t congestion  . Heart murmur   . History of bronchitis 6-7 yrs ago  . Hyperlipidemia   . Hypertension    takes Metoprolol and Diovan daily  . Hypothyroidism 12/06/2011   takes Synthroid daily  . Joint pain   . Multiple allergies    takes Zyrtec daily;uses Nasonex daily as needed  . Nocturia   . Numbness    weakness-right hand  . OSA on CPAP   . Pancreatitis   . Peripheral edema    takes Furosemide daily  . Shingles   . Urinary frequency   . Venous insufficiency of leg     Family History  Problem Relation Age of Onset  . Hypertension Mother   . Hypertension Father   . Hypertension Sister   . Kidney disease Sister        [facility] TRANSPLANT  . Thyroid disease Sister   . Thyroid disease Sister   . Hypertension Sister   . Breast cancer Maternal Aunt        >50; passed away from it  . Breast cancer Maternal Aunt        >50    Past Surgical History:  Procedure Laterality Date  . ABDOMINAL HYSTERECTOMY     uterine prolapse  . CARDIAC SURGERY    . CHOLECYSTECTOMY    . COLONOSCOPY    . ESOPHAGOGASTRODUODENOSCOPY  5-27yrs ago  . FOOT SURGERY Bilateral   . KNEE ARTHROSCOPY    . LAPAROSCOPIC GASTRIC SLEEVE RESECTION N/A 08/23/2016   Procedure: LAPAROSCOPIC GASTRIC SLEEVE RESECTION WITH UPPER ENDO;  Surgeon: Johnathan Hausen, MD;  Location[facility];  Service: General;  Laterality: N/A;  . POSTERIOR LUMBAR FUSION  03/10/2009   L3-4 and L3-4 fusion, Dr. Leeroy Cha  . SHOULDER ARTHROSCOPY WITH ROTATOR CUFF REPAIR AND SUBACROMIAL DECOMPRESSION Right 08/23/2013   Procedure: RIGHT SHOULDER ARTHROSCOPY WITH  SUBACROMIAL DECOMPRESSION DISTAL CLAVICLE RESECTION AND  ROTATOR CUFF REPAIR ;  Surgeon: Marin Shutter, MD;  Location: [facility];  Service: Orthopedics;  Laterality: Right;   . TUBAL LIGATION     Social History   Occupational History    Employer: [employer]    Comment: [employer]  Tobacco Use  . Smoking status: Former Smoker    Packs/day: 0.75    Years: 40.00    Pack years: 30.00    Types: Cigarettes    Quit date: 1993    Years since quitting: 28.7  . Smokeless tobacco: Never Used  . Tobacco comment: quit smoking in 1993  Vaping Use  . Vaping Use: Never used  Substance and Sexual Activity  . Alcohol use: Yes    Alcohol/week: 0.0 standard drinks    Comment: rarely;; mixed drink   .  Drug use: No  . Sexual activity: Yes    Partners: Male    Birth control/protection: Surgical    Comment: Hysterectomy

## 2019-10-04 ENCOUNTER — Other Ambulatory Visit: Payer: Self-pay | Admitting: Physical Medicine and Rehabilitation

## 2019-10-04 NOTE — Telephone Encounter (Signed)
Please advise 

## 2019-10-18 DIAGNOSIS — M961 Postlaminectomy syndrome, not elsewhere classified: Secondary | ICD-10-CM | POA: Diagnosis not present

## 2019-10-18 DIAGNOSIS — Z9689 Presence of other specified functional implants: Secondary | ICD-10-CM | POA: Diagnosis not present

## 2019-10-23 ENCOUNTER — Ambulatory Visit (INDEPENDENT_AMBULATORY_CARE_PROVIDER_SITE_OTHER): Payer: 59 | Admitting: Orthopaedic Surgery

## 2019-10-23 ENCOUNTER — Encounter: Payer: Self-pay | Admitting: Orthopaedic Surgery

## 2019-10-23 ENCOUNTER — Other Ambulatory Visit: Payer: Self-pay

## 2019-10-23 VITALS — Ht 62.0 in | Wt 262.0 lb

## 2019-10-23 DIAGNOSIS — M1711 Unilateral primary osteoarthritis, right knee: Secondary | ICD-10-CM | POA: Diagnosis not present

## 2019-10-23 DIAGNOSIS — M1712 Unilateral primary osteoarthritis, left knee: Secondary | ICD-10-CM

## 2019-10-23 DIAGNOSIS — M170 Bilateral primary osteoarthritis of knee: Secondary | ICD-10-CM

## 2019-10-23 MED ORDER — METHYLPREDNISOLONE ACETATE 40 MG/ML IJ SUSP
80.0000 mg | INTRAMUSCULAR | Status: AC | PRN
  Administered 2019-10-23: 80 mg via INTRA_ARTICULAR

## 2019-10-23 MED ORDER — BUPIVACAINE HCL 0.25 % IJ SOLN
2.0000 mL | INTRAMUSCULAR | Status: AC | PRN
  Administered 2019-10-23: 2 mL via INTRA_ARTICULAR

## 2019-10-23 MED ORDER — LIDOCAINE HCL 1 % IJ SOLN
2.0000 mL | INTRAMUSCULAR | Status: AC | PRN
  Administered 2019-10-23: 2 mL

## 2019-10-23 NOTE — Progress Notes (Signed)
Office Visit Note   Patient: [name]           Date of Birth: May 22, 1960           MRN: [id number] Visit Date: 10/23/2019              Requested by: Velna Hatchet, MD [address] [place],  [place] [address] PCP: Velna Hatchet, MD   Assessment & Plan: Visit Diagnoses:  1. Bilateral primary osteoarthritis of knee     Plan: Follow up PRN   Follow-Up Instructions: Return if symptoms worsen or fail to improve.   Orders:  No orders of the defined types were placed in this encounter.  No orders of the defined types were placed in this encounter.     Procedures: Large Joint Inj: L knee on 10/23/2019 5:42 PM Indications: pain and diagnostic evaluation Details: 25 G 1.5 in needle, anteromedial approach  Arthrogram: No  Medications: 2 mL lidocaine 1 %; 80 mg methylPREDNISolone acetate 40 MG/ML; 2 mL bupivacaine 0.25 % Outcome: tolerated well, no immediate complications Procedure, treatment alternatives, risks and benefits explained, specific risks discussed. Consent was given by the patient. Immediately prior to procedure a time out was called to verify the correct patient, procedure, equipment, support staff and site/side marked as required. Patient was prepped and draped in the usual sterile fashion.       Clinical Data: No additional findings.   Subjective: Chief Complaint  Patient presents with  . Right Knee - Follow-up  . Left Knee - Follow-up   HPI Patient presents today for follow up on her left and right knee. She received a right knee cortisone injection on 10/02/2019. Patient states that the injection in her right knee helped, but has noticed the pain is starting to return. She is wanting to have the left knee injected today.   Review of Systems  Constitutional: Negative for fatigue.  HENT: Negative for ear pain.   Eyes: Negative for pain.  Respiratory: Negative for shortness of breath.   Cardiovascular: Negative for leg swelling.    Gastrointestinal: Positive for constipation.  Endocrine: Negative for cold intolerance and heat intolerance.  Genitourinary: Negative for difficulty urinating.  Musculoskeletal: Positive for joint swelling.  Skin: Negative for rash.  Allergic/Immunologic: Negative for food allergies.  Neurological: Negative for weakness.  Hematological: Does not bruise/bleed easily.  Psychiatric/Behavioral: Negative for sleep disturbance.     Objective: Vital Signs: Ht 5\' 2"  (1.575 m)   Wt 262 lb (118.8 kg)   BMI 47.92 kg/m   Physical Exam Constitutional:      Appearance: She is well-developed.  Eyes:     Pupils: Pupils are equal, round, and reactive to light.  Pulmonary:     Effort: Pulmonary effort is normal.  Skin:    General: Skin is warm and dry.  Neurological:     Mental Status: She is alert and oriented to person, place, and time.  Psychiatric:        Behavior: Behavior normal.     Ortho Exam  Medial joint line pain.  Full extension flexed about 90-95 degrees without instability.  Some patella crepitation but none with patella compression.  No lateral joint pain.    Specialty Comments:  No specialty comments available.  Imaging: No results found.   PMFS History: Current Outpatient Medications  Medication Sig Dispense Refill  . albuterol (PROVENTIL HFA;VENTOLIN HFA) 108 (90 BASE) MCG/ACT inhaler Inhale 1-2 puffs into the lungs every 6 (six) hours as needed for wheezing  or shortness of breath. 1 Inhaler 2  . aspirin EC 81 MG tablet Take 81 mg by mouth daily.     . Azelastine HCl 137 MCG/SPRAY SOLN   3  . baclofen (LIORESAL) 10 MG tablet TAKE 1/2 TO 1 TABLET BY MOUTH EVERY 8 HOURS AS NEEDED FOR SPASMS 60 tablet 0  . betamethasone valerate ointment (VALISONE) 0.1 % Apply a pea sized amount topically BID for up to 2 weeks. Not for daily long term use. 30 g 0  . cephALEXin (KEFLEX) 500 MG capsule Take 1 capsule (500 mg total) by mouth 2 (two) times daily. 14 capsule 0  .  diazepam (VALIUM) 5 MG tablet Take 0.5-1 tablets (2.5-5 mg total) by mouth every 6 (six) hours as needed for muscle spasms or sedation. 40 tablet 1  . diazepam (VALIUM) 5 MG tablet Take 1 by mouth 1 hour  pre-procedure with very light food. May bring 2nd tablet to appointment. 2 tablet 0  . esomeprazole (NEXIUM) 40 MG capsule Take 40 mg by mouth daily.     . fluticasone (FLONASE) 50 MCG/ACT nasal spray Place 1 spray into both nostrils daily as needed for allergies.   1  . furosemide (LASIX) 20 MG tablet Take 3 tablets (60 mg total) by mouth as needed. NEED OV. 90 tablet 3  . gabapentin (NEURONTIN) 300 MG capsule TAKE 1 CAPSULE BY MOUTH THREE TIMES DAILY 270 capsule 1  . ibuprofen (ADVIL,MOTRIN) 600 MG tablet   0  . levothyroxine (SYNTHROID, LEVOTHROID) 75 MCG tablet Take 75 mcg by mouth daily before breakfast.    . LINZESS 72 MCG capsule Take 72 mcg by mouth daily.    Marland Kitchen lisinopril (ZESTRIL) 20 MG tablet Take 1 tablet (20 mg total) by mouth daily. 90 tablet 0  . loratadine (CLARITIN) 10 MG tablet Take 10 mg by mouth daily.    . magnesium oxide (MAG-OX) 400 MG tablet Take 400 mg by mouth daily.    . meloxicam (MOBIC) 15 MG tablet   3  . methocarbamol (ROBAXIN) 500 MG tablet TAKE 1 TABLET BY MOUTH EVERY 8 HOURS AS NEEDED FOR MUSCLE SPASMS 60 tablet 0  . metoprolol tartrate (LOPRESSOR) 25 MG tablet Take 25 mg by mouth 2 (two) times daily.    Marland Kitchen nystatin cream (MYCOSTATIN) Apply 1 application topically 2 (two) times daily. Apply to affected area BID for up to 7 days. 30 g 1  . Omega-3 Fatty Acids (FISH OIL) 1000 MG CAPS Take 1,000 mg by mouth daily.    . ondansetron (ZOFRAN) 4 MG tablet Take 1 tablet (4 mg total) by mouth every 8 (eight) hours as needed for nausea or vomiting. 20 tablet 0  . vitamin C (ASCORBIC ACID) 500 MG tablet Take 500 mg by mouth daily.    Marland Kitchen VITAMIN D PO Take by mouth.    . potassium chloride SA (K-DUR) 20 MEQ tablet TAKE 1 TABLET (20 MEQ TOTAL) BY MOUTH DAILY. 90 tablet 1   No  current facility-administered medications for this visit.    Patient Active Problem List   Diagnosis Date Noted  . Chronic pain syndrome 05/04/2019  . Lumbar radiculopathy 12/18/2018  . Spinal stenosis of lumbar region with neurogenic claudication 12/18/2018  . Post laminectomy syndrome 12/18/2018  . Bilateral primary osteoarthritis of knee 12/27/2016  . 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 Prediabetes 12/03/2015  . OSA on CPAP 02/19/2015  . Morbid obesity ([place]) 10/09/2014  . Pulmonary HTN ([place]) 05/12/2013  . Bilateral  lower extremity edema 05/12/2013  . Morbid obesity with BMI of 50.0-59.9, adult ([place])   . Back pain, hx of prior back surgery 12/09/2011  . Pancreatitis, mild, CT negative 12/08/2011  . Fatty liver 12/08/2011  . Substernal chest pain, suspected secondary to acute pancreatitis 12/06/2011  . Hypertension 12/06/2011  . Hypokalemia 12/06/2011  . Hypothyroidism 12/06/2011  . Anxiety 12/06/2011  . BV (bacterial vaginosis)   . Hyperlipidemia    Past Medical History:  Diagnosis Date  . Anemia when she was young  . Arthritis   . Congestion of nasal sinus   . Constipation    takes Colace every other day  . DDD (degenerative disc disease), lumbar   . Diabetes mellitus without complication ([place])    per pt had been diagnosed but then "number started going down so my doctor said i didnt have diabetes"  . Dry eyes    uses Systane Eye drops daily as needed  . GERD (gastroesophageal reflux disease) 12/06/2011   takes Nexium daily  . Headache(784.0)    occasionally-d/t congestion  . Heart murmur   . History of bronchitis 6-7 yrs ago  . Hyperlipidemia   . Hypertension    takes Metoprolol and Diovan daily  . Hypothyroidism 12/06/2011   takes Synthroid daily  . Joint pain   . Multiple allergies    takes Zyrtec daily;uses Nasonex daily as needed  . Nocturia   . Numbness    weakness-right hand  . OSA on CPAP   . Pancreatitis   . Peripheral  edema    takes Furosemide daily  . Shingles   . Urinary frequency   . Venous insufficiency of leg     Family History  Problem Relation Age of Onset  . Hypertension Mother   . Hypertension Father   . Hypertension Sister   . Kidney disease Sister        [facility] TRANSPLANT  . Thyroid disease Sister   . Thyroid disease Sister   . Hypertension Sister   . Breast cancer Maternal Aunt        >50; passed away from it  . Breast cancer Maternal Aunt        >50    Past Surgical History:  Procedure Laterality Date  . ABDOMINAL HYSTERECTOMY     uterine prolapse  . CARDIAC SURGERY    . CHOLECYSTECTOMY    . COLONOSCOPY    . ESOPHAGOGASTRODUODENOSCOPY  5-51yrs ago  . FOOT SURGERY Bilateral   . KNEE ARTHROSCOPY    . LAPAROSCOPIC GASTRIC SLEEVE RESECTION N/A 08/23/2016   Procedure: LAPAROSCOPIC GASTRIC SLEEVE RESECTION WITH UPPER ENDO;  Surgeon: Johnathan Hausen, MD;  Location: [facility];  Service: General;  Laterality: N/A;  . POSTERIOR LUMBAR FUSION  03/10/2009   L3-4 and L3-4 fusion, Dr. Leeroy Cha  . SHOULDER ARTHROSCOPY WITH ROTATOR CUFF REPAIR AND SUBACROMIAL DECOMPRESSION Right 08/23/2013   Procedure: RIGHT SHOULDER ARTHROSCOPY WITH  SUBACROMIAL DECOMPRESSION DISTAL CLAVICLE RESECTION AND  ROTATOR CUFF REPAIR ;  Surgeon: Marin Shutter, MD;  Location: [facility];  Service: Orthopedics;  Laterality: Right;  . TUBAL LIGATION     Social History   Occupational History    Employer: [employer]    Comment: [employer]  Tobacco Use  . Smoking status: Former Smoker    Packs/day: 0.75    Years: 40.00    Pack years: 30.00    Types: Cigarettes    Quit date: 1993    Years since quitting: 28.7  . Smokeless tobacco: Never  Used  . Tobacco comment: quit smoking in 1993  Vaping Use  . Vaping Use: Never used  Substance and Sexual Activity  . Alcohol use: Yes    Alcohol/week: 0.0 standard drinks    Comment: rarely;; mixed drink   . Drug use: No  . Sexual activity: Yes    Partners: Male    Birth  control/protection: Surgical    Comment: Hysterectomy

## 2019-10-25 ENCOUNTER — Telehealth: Payer: Self-pay

## 2019-10-25 NOTE — Telephone Encounter (Signed)
Spoke with patient. Patient reports increased hot flashes and trouble sleeping, would like to discuss HRT. She is currently on gabapentin prescribed by orthopedic, no change in symptoms. H/o hysterectomy, still has ovaries.   AEX 07/09/19  Patient request [place] visit. MyChart visit scheduled for 11/13/19 at 11:30am w/ Dr. Talbert Nan. Patient is agreeable to date and time.   Encounter closed.

## 2019-10-25 NOTE — Telephone Encounter (Signed)
Patient is calling in regards to "having hot flases, and wanting the doctor to call in estrogen". Patient stated she can be reached on cell or at work number [phone].

## 2019-11-02 ENCOUNTER — Other Ambulatory Visit (HOSPITAL_COMMUNITY): Payer: Self-pay | Admitting: Internal Medicine

## 2019-11-02 ENCOUNTER — Other Ambulatory Visit: Payer: Self-pay | Admitting: Cardiovascular Disease

## 2019-11-05 ENCOUNTER — Other Ambulatory Visit: Payer: Self-pay | Admitting: Cardiovascular Disease

## 2019-11-05 ENCOUNTER — Telehealth: Payer: Self-pay | Admitting: Pharmacist

## 2019-11-05 DIAGNOSIS — I10 Essential (primary) hypertension: Secondary | ICD-10-CM

## 2019-11-05 MED ORDER — LISINOPRIL 40 MG PO TABS
40.0000 mg | ORAL_TABLET | Freq: Every day | ORAL | 1 refills | Status: DC
Start: 2019-11-05 — End: 2019-11-05

## 2019-11-05 NOTE — Telephone Encounter (Signed)
Blood pressure 168/102 while at work (Patient of DR Croitoru)  Instructed to increase lisinopril dose to 40mg  daily and increase hydration.   BMET repeat in 2 weeks

## 2019-11-05 NOTE — Telephone Encounter (Signed)
Thanks. Agree with increased lisinopril.

## 2019-11-07 ENCOUNTER — Other Ambulatory Visit (HOSPITAL_COMMUNITY): Payer: Self-pay | Admitting: Registered Nurse

## 2019-11-13 ENCOUNTER — Encounter: Payer: Self-pay | Admitting: Obstetrics and Gynecology

## 2019-11-13 ENCOUNTER — Telehealth (INDEPENDENT_AMBULATORY_CARE_PROVIDER_SITE_OTHER): Payer: 59 | Admitting: Obstetrics and Gynecology

## 2019-11-13 DIAGNOSIS — E7849 Other hyperlipidemia: Secondary | ICD-10-CM | POA: Diagnosis not present

## 2019-11-13 DIAGNOSIS — N951 Menopausal and female climacteric states: Secondary | ICD-10-CM

## 2019-11-13 DIAGNOSIS — I10 Essential (primary) hypertension: Secondary | ICD-10-CM | POA: Diagnosis not present

## 2019-11-13 NOTE — Progress Notes (Signed)
Virtual Visit via Video Note  I connected with [name] on 11/13/19 at 11:30 AM EDT by a video enabled telemedicine application and verified that I am speaking with the correct person using two identifiers.  Location: Patient: Work Secondary school teacher: Marketing executive at [employer]   I discussed the limitations of evaluation and management by telemedicine and the availability of in person appointments. The patient expressed understanding and agreed to proceed.  GYNECOLOGY  VISIT   HPI: 59 y.o.   Divorced Black or [place] American Not Hispanic or Latino  female   [phone] with No LMP recorded. Patient has had a hysterectomy.   here for    C/o hot flashes almost every day. Gets very sweaty during the day, all of a sudden. Occurs with activity. Having hot flashes about 5 days a week. Lasts for 5-10 minutes. Typically one time a day, can be more if she is really active.  She does drink iced coffee 3 x a week.  She is on Gabapentin 300 mg 3 x a day.   She has hypertension, reports her most recent BP was 162/102.   Not really having trouble at night.    GYNECOLOGIC HISTORY: No LMP recorded. Patient has had a hysterectomy. Contraception: PMP Menopausal hormone therapy: none        OB History    Gravida  2   Para  2   Term  2   Preterm      AB      Living  2     SAB      TAB      Ectopic      Multiple      Live Births  2              Patient Active Problem List   Diagnosis Date Noted  . Chronic pain syndrome 05/04/2019  . Lumbar radiculopathy 12/18/2018  . Spinal stenosis of lumbar region with neurogenic claudication 12/18/2018  . Post laminectomy syndrome 12/18/2018  . Bilateral primary osteoarthritis of knee 12/27/2016  . 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 Prediabetes 12/03/2015  . OSA on CPAP 02/19/2015  . Morbid obesity ([place]) 10/09/2014  . Pulmonary HTN ([place]) 05/12/2013  . Bilateral lower extremity edema  05/12/2013  . Morbid obesity with BMI of 50.0-59.9, adult ([place])   . Back pain, hx of prior back surgery 12/09/2011  . Pancreatitis, mild, CT negative 12/08/2011  . Fatty liver 12/08/2011  . Substernal chest pain, suspected secondary to acute pancreatitis 12/06/2011  . Hypertension 12/06/2011  . Hypokalemia 12/06/2011  . Hypothyroidism 12/06/2011  . Anxiety 12/06/2011  . BV (bacterial vaginosis)   . Hyperlipidemia     Past Medical History:  Diagnosis Date  . Anemia when she was young  . Arthritis   . Congestion of nasal sinus   . Constipation    takes Colace every other day  . DDD (degenerative disc disease), lumbar   . Diabetes mellitus without complication ([place])    per pt had been diagnosed but then "number started going down so my doctor said i didnt have diabetes"  . Dry eyes    uses Systane Eye drops daily as needed  . GERD (gastroesophageal reflux disease) 12/06/2011   takes Nexium daily  . Headache(784.0)    occasionally-d/t congestion  . Heart murmur   . History of bronchitis 6-7 yrs ago  . Hyperlipidemia   . Hypertension    takes Metoprolol and Diovan daily  .  Hypothyroidism 12/06/2011   takes Synthroid daily  . Joint pain   . Multiple allergies    takes Zyrtec daily;uses Nasonex daily as needed  . Nocturia   . Numbness    weakness-right hand  . OSA on CPAP   . Pancreatitis   . Peripheral edema    takes Furosemide daily  . Shingles   . Urinary frequency   . Venous insufficiency of leg     Past Surgical History:  Procedure Laterality Date  . ABDOMINAL HYSTERECTOMY     uterine prolapse  . CARDIAC SURGERY    . CHOLECYSTECTOMY    . COLONOSCOPY    . ESOPHAGOGASTRODUODENOSCOPY  5-7yrs ago  . FOOT SURGERY Bilateral   . KNEE ARTHROSCOPY    . LAPAROSCOPIC GASTRIC SLEEVE RESECTION N/A 08/23/2016   Procedure: LAPAROSCOPIC GASTRIC SLEEVE RESECTION WITH UPPER ENDO;  Surgeon: Johnathan Hausen, MD;  Location[facility];  Service: General;  Laterality: N/A;  .  POSTERIOR LUMBAR FUSION  03/10/2009   L3-4 and L3-4 fusion, Dr. Leeroy Cha  . SHOULDER ARTHROSCOPY WITH ROTATOR CUFF REPAIR AND SUBACROMIAL DECOMPRESSION Right 08/23/2013   Procedure: RIGHT SHOULDER ARTHROSCOPY WITH  SUBACROMIAL DECOMPRESSION DISTAL CLAVICLE RESECTION AND  ROTATOR CUFF REPAIR ;  Surgeon: Marin Shutter, MD;  Location: [facility];  Service: Orthopedics;  Laterality: Right;  . TUBAL LIGATION      Current Outpatient Medications  Medication Sig Dispense Refill  . albuterol (PROVENTIL HFA;VENTOLIN HFA) 108 (90 BASE) MCG/ACT inhaler Inhale 1-2 puffs into the lungs every 6 (six) hours as needed for wheezing or shortness of breath. 1 Inhaler 2  . aspirin EC 81 MG tablet Take 81 mg by mouth daily.     . Azelastine HCl 137 MCG/SPRAY SOLN   3  . baclofen (LIORESAL) 10 MG tablet TAKE 1/2 TO 1 TABLET BY MOUTH EVERY 8 HOURS AS NEEDED FOR SPASMS 60 tablet 0  . betamethasone valerate ointment (VALISONE) 0.1 % Apply a pea sized amount topically BID for up to 2 weeks. Not for daily long term use. 30 g 0  . cephALEXin (KEFLEX) 500 MG capsule Take 1 capsule (500 mg total) by mouth 2 (two) times daily. 14 capsule 0  . diazepam (VALIUM) 5 MG tablet Take 0.5-1 tablets (2.5-5 mg total) by mouth every 6 (six) hours as needed for muscle spasms or sedation. 40 tablet 1  . diazepam (VALIUM) 5 MG tablet Take 1 by mouth 1 hour  pre-procedure with very light food. May bring 2nd tablet to appointment. 2 tablet 0  . esomeprazole (NEXIUM) 40 MG capsule Take 40 mg by mouth daily.     . fluticasone (FLONASE) 50 MCG/ACT nasal spray Place 1 spray into both nostrils daily as needed for allergies.   1  . furosemide (LASIX) 20 MG tablet Take 3 tablets (60 mg total) by mouth as needed. NEED OV. 90 tablet 3  . gabapentin (NEURONTIN) 300 MG capsule TAKE 1 CAPSULE BY MOUTH THREE TIMES DAILY 270 capsule 1  . ibuprofen (ADVIL,MOTRIN) 600 MG tablet   0  . levothyroxine (SYNTHROID, LEVOTHROID) 75 MCG tablet Take 75 mcg by mouth  daily before breakfast.    . LINZESS 72 MCG capsule Take 72 mcg by mouth daily.    Marland Kitchen lisinopril (ZESTRIL) 40 MG tablet Take 1 tablet (40 mg total) by mouth daily. 90 tablet 1  . loratadine (CLARITIN) 10 MG tablet Take 10 mg by mouth daily.    . magnesium oxide (MAG-OX) 400 MG tablet Take 400 mg by  mouth daily.    . meloxicam (MOBIC) 15 MG tablet   3  . methocarbamol (ROBAXIN) 500 MG tablet TAKE 1 TABLET BY MOUTH EVERY 8 HOURS AS NEEDED FOR MUSCLE SPASMS 60 tablet 0  . metoprolol tartrate (LOPRESSOR) 25 MG tablet Take 25 mg by mouth 2 (two) times daily.    Marland Kitchen nystatin cream (MYCOSTATIN) Apply 1 application topically 2 (two) times daily. Apply to affected area BID for up to 7 days. 30 g 1  . Omega-3 Fatty Acids (FISH OIL) 1000 MG CAPS Take 1,000 mg by mouth daily.    . ondansetron (ZOFRAN) 4 MG tablet Take 1 tablet (4 mg total) by mouth every 8 (eight) hours as needed for nausea or vomiting. 20 tablet 0  . potassium chloride SA (K-DUR) 20 MEQ tablet TAKE 1 TABLET (20 MEQ TOTAL) BY MOUTH DAILY. 90 tablet 1  . vitamin C (ASCORBIC ACID) 500 MG tablet Take 500 mg by mouth daily.    Marland Kitchen VITAMIN D PO Take by mouth.     No current facility-administered medications for this visit.     ALLERGIES: Metronidazole, Adhesive [tape], Latex, Penicillins, and Vesicare [solifenacin]  Family History  Problem Relation Age of Onset  . Hypertension Mother   . Hypertension Father   . Hypertension Sister   . Kidney disease Sister        [facility] TRANSPLANT  . Thyroid disease Sister   . Thyroid disease Sister   . Hypertension Sister   . Breast cancer Maternal Aunt        >50; passed away from it  . Breast cancer Maternal Aunt        >50    Social History   Socioeconomic History  . Marital status: Divorced    Spouse name: Not on file  . Number of children: 2  . Years of education: College  . Highest education level: Not on file  Occupational History    Employer: [employer]    Comment: [employer]  Tobacco  Use  . Smoking status: Former Smoker    Packs/day: 0.75    Years: 40.00    Pack years: 30.00    Types: Cigarettes    Quit date: 1993    Years since quitting: 28.8  . Smokeless tobacco: Never Used  . Tobacco comment: quit smoking in 1993  Vaping Use  . Vaping Use: Never used  Substance and Sexual Activity  . Alcohol use: Yes    Alcohol/week: 0.0 standard drinks    Comment: rarely;; mixed drink   . Drug use: No  . Sexual activity: Yes    Partners: Male    Birth control/protection: Surgical    Comment: Hysterectomy  Other Topics Concern  . Not on file  Social History Narrative   Caffeine: Coffee   Social Determinants of Health   Financial Resource Strain:   . Difficulty of Paying Living Expenses: Not on file  Food Insecurity:   . Worried About Charity fundraiser in the Last Year: Not on file  . Ran Out of Food in the Last Year: Not on file  Transportation Needs:   . Lack of Transportation (Medical): Not on file  . Lack of Transportation (Non-Medical): Not on file  Physical Activity:   . Days of Exercise per Week: Not on file  . Minutes of Exercise per Session: Not on file  Stress:   . Feeling of Stress : Not on file  Social Connections:   . Frequency of Communication with Friends and  Family: Not on file  . Frequency of Social Gatherings with Friends and Family: Not on file  . Attends Religious Services: Not on file  . Active Member of Clubs or Organizations: Not on file  . Attends Archivist Meetings: Not on file  . Marital Status: Not on file  Intimate Partner Violence:   . Fear of Current or Ex-Partner: Not on file  . Emotionally Abused: Not on file  . Physically Abused: Not on file  . Sexually Abused: Not on file    ROS  PHYSICAL EXAMINATION:    There were no vitals taken for this visit.     ASSESSMENT Hot flashes, she is already on Gabapentin 300 mg TID.   Multiple medical problems, including obesity, HTN, hyperlipidemia    PLAN Given her  cardiac risks, I don't think she is a good candidate for ERT. We discussed avoiding triggers and behavior changes to help with vasomotor symptoms. We discussed trying Estroven. She will try the behavior changes and Estroven. If that doesn't work, we could consider increasing her gabapentin slightly or adding an SSRI (don't typically add to gabapentin, but she needs the gabapentin for pain).    Over 20 minutes were spent in total patient care  Salvadore Dom, MD

## 2019-11-13 NOTE — Patient Instructions (Signed)
You can try Estroven for hot flashes   Menopause Menopause is the normal time of life when menstrual periods stop completely. It is usually confirmed by 12 months without a menstrual period. The transition to menopause (perimenopause) most often happens between the ages of 82 and 6. During perimenopause, hormone levels change in your body, which can cause symptoms and affect your health. Menopause may increase your risk for:  Loss of bone (osteoporosis), which causes bone breaks (fractures).  Depression.  Hardening and narrowing of the arteries (atherosclerosis), which can cause heart attacks and strokes. What are the causes? This condition is usually caused by a natural change in hormone levels that happens as you get older. The condition may also be caused by surgery to remove both ovaries (bilateral oophorectomy). What increases the risk? This condition is more likely to start at an earlier age if you have certain medical conditions or treatments, including:  A tumor of the pituitary gland in the brain.  A disease that affects the ovaries and hormone production.  Radiation treatment for cancer.  Certain cancer treatments, such as chemotherapy or hormone (anti-estrogen) therapy.  Heavy smoking and excessive alcohol use.  Family history of early menopause. This condition is also more likely to develop earlier in women who are very thin. What are the signs or symptoms? Symptoms of this condition include:  Hot flashes.  Irregular menstrual periods.  Night sweats.  Changes in feelings about sex. This could be a decrease in sex drive or an increased comfort around your sexuality.  Vaginal dryness and thinning of the vaginal walls. This may cause painful intercourse.  Dryness of the skin and development of wrinkles.  Headaches.  Problems sleeping (insomnia).  Mood swings or irritability.  Memory problems.  Weight gain.  Hair growth on the face and chest.  Bladder  infections or problems with urinating. How is this diagnosed? This condition is diagnosed based on your medical history, a physical exam, your age, your menstrual history, and your symptoms. Hormone tests may also be done. How is this treated? In some cases, no treatment is needed. You and your health care provider should make a decision together about whether treatment is necessary. Treatment will be based on your individual condition and preferences. Treatment for this condition focuses on managing symptoms. Treatment may include:  Menopausal hormone therapy (MHT).  Medicines to treat specific symptoms or complications.  Acupuncture.  Vitamin or herbal supplements. Before starting treatment, make sure to let your health care provider know if you have a personal or family history of:  Heart disease.  Breast cancer.  Blood clots.  Diabetes.  Osteoporosis. Follow these instructions at home: Lifestyle  Do not use any products that contain nicotine or tobacco, such as cigarettes and e-cigarettes. If you need help quitting, ask your health care provider.  Get at least 30 minutes of physical activity on 5 or more days each week.  Avoid alcoholic and caffeinated beverages, as well as spicy foods. This may help prevent hot flashes.  Get 7-8 hours of sleep each night.  If you have hot flashes, try: ? Dressing in layers. ? Avoiding things that may trigger hot flashes, such as spicy food, warm places, or stress. ? Taking slow, deep breaths when a hot flash starts. ? Keeping a fan in your home and office.  Find ways to manage stress, such as deep breathing, meditation, or journaling.  Consider going to group therapy with other women who are having menopause symptoms. Ask your health  care provider about recommended group therapy meetings. Eating and drinking  Eat a healthy, balanced diet that contains whole grains, lean protein, low-fat dairy, and plenty of fruits and  vegetables.  Your health care provider may recommend adding more soy to your diet. Foods that contain soy include tofu, tempeh, and soy milk.  Eat plenty of foods that contain calcium and vitamin D for bone health. Items that are rich in calcium include low-fat milk, yogurt, beans, almonds, sardines, broccoli, and kale. Medicines  Take over-the-counter and prescription medicines only as told by your health care provider.  Talk with your health care provider before starting any herbal supplements. If prescribed, take vitamins and supplements as told by your health care provider. These may include: ? Calcium. Women age 5 and older should get 1,200 mg (milligrams) of calcium every day. ? Vitamin D. Women need 600-800 International Units of vitamin D each day. ? Vitamins B12 and B6. Aim for 50 micrograms of B12 and 1.5 mg of B6 each day. General instructions  Keep track of your menstrual periods, including: ? When they occur. ? How heavy they are and how long they last. ? How much time passes between periods.  Keep track of your symptoms, noting when they start, how often you have them, and how long they last.  Use vaginal lubricants or moisturizers to help with vaginal dryness and improve comfort during sex.  Keep all follow-up visits as told by your health care provider. This is important. This includes any group therapy or counseling. Contact a health care provider if:  You are still having menstrual periods after age 85.  You have pain during sex.  You have not had a period for 12 months and you develop vaginal bleeding. Get help right away if:  You have: ? Severe depression. ? Excessive vaginal bleeding. ? Pain when you urinate. ? A fast or irregular heart beat (palpitations). ? Severe headaches. ? Abdomen (abdominal) pain or severe indigestion.  You fell and you think you have a broken bone.  You develop leg or chest pain.  You develop vision problems.  You feel a  lump in your breast. Summary  Menopause is the normal time of life when menstrual periods stop completely. It is usually confirmed by 12 months without a menstrual period.  The transition to menopause (perimenopause) most often happens between the ages of 47 and 71.  Symptoms can be managed through medicines, lifestyle changes, and complementary therapies such as acupuncture.  Eat a balanced diet that is rich in nutrients to promote bone health and heart health and to manage symptoms during menopause. This information is not intended to replace advice given to you by your health care provider. Make sure you discuss any questions you have with your health care provider. Document Revised: 12/24/2016 Document Reviewed: 02/14/2016 Elsevier Patient Education  2020 [employer].

## 2019-11-20 ENCOUNTER — Other Ambulatory Visit: Payer: Self-pay | Admitting: Internal Medicine

## 2019-11-20 DIAGNOSIS — Z1231 Encounter for screening mammogram for malignant neoplasm of breast: Secondary | ICD-10-CM

## 2019-11-21 DIAGNOSIS — I10 Essential (primary) hypertension: Secondary | ICD-10-CM | POA: Diagnosis not present

## 2019-11-21 LAB — BASIC METABOLIC PANEL
BUN/Creatinine Ratio: 19 (ref 9–23)
BUN: 13 mg/dL (ref 6–24)
CO2: 24 mmol/L (ref 20–29)
Calcium: 9.6 mg/dL (ref 8.7–10.2)
Chloride: 102 mmol/L (ref 96–106)
Creatinine, Ser: 0.7 mg/dL (ref 0.57–1.00)
GFR calc Af Amer: 110 mL/min/{1.73_m2} (ref >59)
GFR calc non Af Amer: 95 mL/min/{1.73_m2} (ref >59)
Glucose: 82 mg/dL (ref 65–99)
Potassium: 4.4 mmol/L (ref 3.5–5.2)
Sodium: 139 mmol/L (ref 134–144)

## 2019-11-29 ENCOUNTER — Other Ambulatory Visit: Payer: Self-pay

## 2019-11-29 ENCOUNTER — Encounter: Payer: Self-pay | Admitting: Orthopaedic Surgery

## 2019-11-29 ENCOUNTER — Ambulatory Visit: Payer: 59 | Admitting: Orthopaedic Surgery

## 2019-11-29 VITALS — Ht 62.0 in | Wt 264.0 lb

## 2019-11-29 DIAGNOSIS — M170 Bilateral primary osteoarthritis of knee: Secondary | ICD-10-CM | POA: Diagnosis not present

## 2019-11-29 MED ORDER — BUPIVACAINE HCL 0.5 % IJ SOLN
2.0000 mL | INTRAMUSCULAR | Status: AC | PRN
  Administered 2019-11-29: 2 mL via INTRA_ARTICULAR

## 2019-11-29 MED ORDER — LIDOCAINE HCL 1 % IJ SOLN
2.0000 mL | INTRAMUSCULAR | Status: AC | PRN
  Administered 2019-11-29: 2 mL

## 2019-11-29 NOTE — Progress Notes (Signed)
Office Visit Note   Patient: [name]           Date of Birth: 10/02/1960           MRN: [id number] Visit Date: 11/29/2019              Requested by: Velna Hatchet, MD [address] [place],  [place] [address] PCP: Velna Hatchet, MD   Assessment & Plan: Visit Diagnoses:  1. Bilateral primary osteoarthritis of knee     Plan: End-stage osteoarthritis both knees.  She has had cortisone and viscosupplementation in the past.  She has had both knees injected within the past several months but really having a difficult time with the right knee.  Will reinject that today  Follow-Up Instructions: Return if symptoms worsen or fail to improve.   Orders:  Orders Placed This Encounter  Procedures  . Large Joint Inj: R knee   No orders of the defined types were placed in this encounter.     Procedures: Large Joint Inj: R knee on 11/29/2019 3:48 PM Indications: pain and diagnostic evaluation Details: 25 G 1.5 in needle, anterolateral approach  Arthrogram: No  Medications: 2 mL lidocaine 1 %; 2 mL bupivacaine 0.5 %  2 mL betamethasone Procedure, treatment alternatives, risks and benefits explained, specific risks discussed. Consent was given by the patient. Immediately prior to procedure a time out was called to verify the correct patient, procedure, equipment, support staff and site/side marked as required. Patient was prepped and draped in the usual sterile fashion.       Clinical Data: No additional findings.   Subjective: Chief Complaint  Patient presents with  . Right Knee - Pain  Patient presents today for recurrent chronic right knee pain. She was here almost two months ago and received a right knee injection. She said that it helped, but did not last long. She has been hurting again for at least two weeks. She wants to get another cortisone injection today.   HPI  Review of Systems   Objective: Vital Signs: Ht 5\' 2"  (1.575 m)   Wt 264 lb (119.7  kg)   BMI 48.29 kg/m   Physical Exam Constitutional:      Appearance: She is well-developed.  Eyes:     Pupils: Pupils are equal, round, and reactive to light.  Pulmonary:     Effort: Pulmonary effort is normal.  Skin:    General: Skin is warm and dry.  Neurological:     Mental Status: She is alert and oriented to person, place, and time.  Psychiatric:        Behavior: Behavior normal.     Ortho Exam awake alert and oriented x3.  Comfortable sitting.  BMI is 48.  Large legs.  Not sure there was an effusion of her right knee.  Mostly lateral joint pain today.  Some patella crepitation but no pain with compression.  No instability.  Full extension and flexed about 100 degrees.  Specialty Comments:  No specialty comments available.  Imaging: No results found.   PMFS History: Patient Active Problem List   Diagnosis Date Noted  . Chronic pain syndrome 05/04/2019  . Lumbar radiculopathy 12/18/2018  . Spinal stenosis of lumbar region with neurogenic claudication 12/18/2018  . Post laminectomy syndrome 12/18/2018  . Bilateral primary osteoarthritis of knee 12/27/2016  . 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 Prediabetes 12/03/2015  . OSA on CPAP 02/19/2015  . Morbid obesity ([place]) 10/09/2014  . Pulmonary  HTN ([place]) 05/12/2013  . Bilateral lower extremity edema 05/12/2013  . Morbid obesity with BMI of 50.0-59.9, adult ([place])   . Back pain, hx of prior back surgery 12/09/2011  . Pancreatitis, mild, CT negative 12/08/2011  . Fatty liver 12/08/2011  . Substernal chest pain, suspected secondary to acute pancreatitis 12/06/2011  . Hypertension 12/06/2011  . Hypokalemia 12/06/2011  . Hypothyroidism 12/06/2011  . Anxiety 12/06/2011  . BV (bacterial vaginosis)   . Hyperlipidemia    Past Medical History:  Diagnosis Date  . Anemia when she was young  . Arthritis   . Congestion of nasal sinus   . Constipation    takes Colace every other day  . DDD  (degenerative disc disease), lumbar   . Diabetes mellitus without complication ([facility])    per pt had been diagnosed but then "number started going down so my doctor said i didnt have diabetes"  . Dry eyes    uses Systane Eye drops daily as needed  . GERD (gastroesophageal reflux disease) 12/06/2011   takes Nexium daily  . Headache(784.0)    occasionally-d/t congestion  . Heart murmur   . History of bronchitis 6-7 yrs ago  . Hyperlipidemia   . Hypertension    takes Metoprolol and Diovan daily  . Hypothyroidism 12/06/2011   takes Synthroid daily  . Joint pain   . Multiple allergies    takes Zyrtec daily;uses Nasonex daily as needed  . Nocturia   . Numbness    weakness-right hand  . OSA on CPAP   . Pancreatitis   . Peripheral edema    takes Furosemide daily  . Shingles   . Urinary frequency   . Venous insufficiency of leg     Family History  Problem Relation Age of Onset  . Hypertension Mother   . Hypertension Father   . Hypertension Sister   . Kidney disease Sister        [facility] TRANSPLANT  . Thyroid disease Sister   . Thyroid disease Sister   . Hypertension Sister   . Breast cancer Maternal Aunt        >50; passed away from it  . Breast cancer Maternal Aunt        >50    Past Surgical History:  Procedure Laterality Date  . ABDOMINAL HYSTERECTOMY     uterine prolapse  . CARDIAC SURGERY    . CHOLECYSTECTOMY    . COLONOSCOPY    . ESOPHAGOGASTRODUODENOSCOPY  5-47yrs ago  . FOOT SURGERY Bilateral   . KNEE ARTHROSCOPY    . LAPAROSCOPIC GASTRIC SLEEVE RESECTION N/A 08/23/2016   Procedure: LAPAROSCOPIC GASTRIC SLEEVE RESECTION WITH UPPER ENDO;  Surgeon: Johnathan Hausen, MD;  Location: [facility];  Service: General;  Laterality: N/A;  . POSTERIOR LUMBAR FUSION  03/10/2009   L3-4 and L3-4 fusion, Dr. Leeroy Cha  . SHOULDER ARTHROSCOPY WITH ROTATOR CUFF REPAIR AND SUBACROMIAL DECOMPRESSION Right 08/23/2013   Procedure: RIGHT SHOULDER ARTHROSCOPY WITH  SUBACROMIAL  DECOMPRESSION DISTAL CLAVICLE RESECTION AND  ROTATOR CUFF REPAIR ;  Surgeon: Marin Shutter, MD;  Location: [facility];  Service: Orthopedics;  Laterality: Right;  . TUBAL LIGATION     Social History   Occupational History    Employer: [employer]    Comment: [employer]  Tobacco Use  . Smoking status: Former Smoker    Packs/day: 0.75    Years: 40.00    Pack years: 30.00    Types: Cigarettes    Quit date: 1993    Years since  quitting: 28.8  . Smokeless tobacco: Never Used  . Tobacco comment: quit smoking in 1993  Vaping Use  . Vaping Use: Never used  Substance and Sexual Activity  . Alcohol use: Yes    Alcohol/week: 0.0 standard drinks    Comment: rarely;; mixed drink   . Drug use: No  . Sexual activity: Yes    Partners: Male    Birth control/protection: Surgical    Comment: Hysterectomy

## 2019-12-14 DIAGNOSIS — G4733 Obstructive sleep apnea (adult) (pediatric): Secondary | ICD-10-CM | POA: Diagnosis not present

## 2019-12-18 ENCOUNTER — Other Ambulatory Visit: Payer: Self-pay

## 2019-12-18 ENCOUNTER — Encounter: Payer: Self-pay | Admitting: Podiatry

## 2019-12-18 ENCOUNTER — Other Ambulatory Visit (HOSPITAL_COMMUNITY): Payer: Self-pay | Admitting: Internal Medicine

## 2019-12-18 ENCOUNTER — Ambulatory Visit (INDEPENDENT_AMBULATORY_CARE_PROVIDER_SITE_OTHER): Payer: 59 | Admitting: Podiatry

## 2019-12-18 DIAGNOSIS — M778 Other enthesopathies, not elsewhere classified: Secondary | ICD-10-CM

## 2019-12-18 MED ORDER — TRIAMCINOLONE ACETONIDE 40 MG/ML IJ SUSP
20.0000 mg | Freq: Once | INTRAMUSCULAR | Status: AC
  Administered 2019-12-18: 20 mg

## 2019-12-18 NOTE — Progress Notes (Signed)
She presents today states that her left dorsal lateral foot has been bothering her across the top.  She has a history of osteoarthritis and has had surgery dorsally.  States that it started hurting intermittently at first and then has become more consistent over the last few days.  Objective: Vital signs are stable alert oriented x3.  Pulses are palpable.  On palpation we can feel dorsal exostoses and she has pain on frontal plane range of motion in the midfoot.  Assessment dorsolateral capsulitis with osteoarthritis.  Plan: Injected 15 mg of Kenalog and local anesthetic along the joint line third fourth and fifth TMT joints.  Tolerated procedure well we did discuss the use of Voltaren gel.  She will follow up with me on an as-needed basis.

## 2019-12-25 ENCOUNTER — Other Ambulatory Visit: Payer: Self-pay | Admitting: Physical Medicine and Rehabilitation

## 2019-12-25 NOTE — Telephone Encounter (Signed)
Please advise 

## 2020-01-14 ENCOUNTER — Ambulatory Visit: Payer: 59

## 2020-01-16 ENCOUNTER — Other Ambulatory Visit: Payer: Self-pay

## 2020-01-16 ENCOUNTER — Ambulatory Visit
Admission: RE | Admit: 2020-01-16 | Discharge: 2020-01-16 | Disposition: A | Discharge status: Home / Self Care | Payer: 59 | Source: Ambulatory Visit | Attending: Internal Medicine | Admitting: Internal Medicine

## 2020-01-16 DIAGNOSIS — Z1231 Encounter for screening mammogram for malignant neoplasm of breast: Secondary | ICD-10-CM

## 2020-01-22 ENCOUNTER — Other Ambulatory Visit: Payer: Self-pay | Admitting: Internal Medicine

## 2020-01-22 DIAGNOSIS — R928 Other abnormal and inconclusive findings on diagnostic imaging of breast: Secondary | ICD-10-CM

## 2020-01-30 ENCOUNTER — Other Ambulatory Visit: Payer: Self-pay

## 2020-01-30 ENCOUNTER — Ambulatory Visit (INDEPENDENT_AMBULATORY_CARE_PROVIDER_SITE_OTHER): Payer: 59 | Admitting: Orthopaedic Surgery

## 2020-01-30 ENCOUNTER — Encounter: Payer: Self-pay | Admitting: Orthopaedic Surgery

## 2020-01-30 VITALS — Ht 63.0 in | Wt 259.0 lb

## 2020-01-30 DIAGNOSIS — M1711 Unilateral primary osteoarthritis, right knee: Secondary | ICD-10-CM | POA: Diagnosis not present

## 2020-01-30 DIAGNOSIS — M170 Bilateral primary osteoarthritis of knee: Secondary | ICD-10-CM

## 2020-01-30 MED ORDER — BUPIVACAINE HCL 0.25 % IJ SOLN
2.0000 mL | INTRAMUSCULAR | Status: AC | PRN
  Administered 2020-01-30: 2 mL via INTRA_ARTICULAR

## 2020-01-30 NOTE — Progress Notes (Signed)
Office Visit Note   Patient: [name]           Date of Birth: 04/18/1960           MRN: [id number] Visit Date: 01/30/2020              Requested by: Alysia Penna, MD [address] [place],  [place] [address] PCP: Alysia Penna, MD   Assessment & Plan: Visit Diagnoses:  1. Bilateral primary osteoarthritis of knee     Plan: Recurrent symptoms of osteoarthritis right knee predominantly medially.  Will reinject with betamethasone.  Also discussed weight loss with her BMI of approximately 46  Follow-Up Instructions: Return if symptoms worsen or fail to improve.   Orders:  Orders Placed This Encounter  Procedures  . Large Joint Inj: R knee   No orders of the defined types were placed in this encounter.     Procedures: Large Joint Inj: R knee on 01/30/2020 4:34 PM Indications: pain and diagnostic evaluation Details: 25 G 1.5 in needle, anteromedial approach  Arthrogram: No  Medications: 2 mL bupivacaine 0.25 %  2 mL betamethasone injected with Marcaine medial aspect right knee Procedure, treatment alternatives, risks and benefits explained, specific risks discussed. Consent was given by the patient. Immediately prior to procedure a time out was called to verify the correct patient, procedure, equipment, support staff and site/side marked as required. Patient was prepped and draped in the usual sterile fashion.       Clinical Data: No additional findings.   Subjective: Chief Complaint  Patient presents with  . Right Knee - Pain  Patient presents with continued chronic knee pain. She had a cortisone injection on 11/29/2019 which she states helped some, but did not last long.  She is taking Meloxicam and using a topical rub with no relief. She requests repeat injection today.  HPI  Review of Systems   Objective: Vital Signs: Ht 5\' 3"  (1.6 m)   Wt 259 lb (117.5 kg)   BMI 45.88 kg/m   Physical Exam Constitutional:      Appearance: She is  well-developed and well-nourished.  HENT:     Mouth/Throat:     Mouth: Oropharynx is clear and moist.  Eyes:     Extraocular Movements: EOM normal.     Pupils: Pupils are equal, round, and reactive to light.  Pulmonary:     Effort: Pulmonary effort is normal.  Skin:    General: Skin is warm and dry.  Neurological:     Mental Status: She is alert and oriented to person, place, and time.  Psychiatric:        Mood and Affect: Mood and affect normal.        Behavior: Behavior normal.     Ortho Exam awake alert and oriented x3.  Comfortable sitting and in no acute distress.  Large knees.  Having predominately medial joint pain right knee..  No obvious effusion.  Full extension about 95 degrees of flexion when calf touches thigh  Specialty Comments:  No specialty comments available.  Imaging: No results found.   PMFS History: Patient Active Problem List   Diagnosis Date Noted  . Chronic pain syndrome 05/04/2019  . Lumbar radiculopathy 12/18/2018  . Spinal stenosis of lumbar region with neurogenic claudication 12/18/2018  . Post laminectomy syndrome 12/18/2018  . Bilateral primary osteoarthritis of knee 12/27/2016  . 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 Prediabetes 12/03/2015  . OSA on CPAP 02/19/2015  . Morbid obesity (  [facility]) 10/09/2014  . Pulmonary HTN ([facility]) 05/12/2013  . Bilateral lower extremity edema 05/12/2013  . Morbid obesity with BMI of 50.0-59.9, adult ([facility])   . Back pain, hx of prior back surgery 12/09/2011  . Pancreatitis, mild, CT negative 12/08/2011  . Fatty liver 12/08/2011  . Substernal chest pain, suspected secondary to acute pancreatitis 12/06/2011  . Hypertension 12/06/2011  . Hypokalemia 12/06/2011  . Hypothyroidism 12/06/2011  . Anxiety 12/06/2011  . BV (bacterial vaginosis)   . Hyperlipidemia    Past Medical History:  Diagnosis Date  . Anemia when she was young  . Arthritis   . Congestion of nasal sinus   . Constipation     takes Colace every other day  . DDD (degenerative disc disease), lumbar   . Diabetes mellitus without complication ([facility])    per pt had been diagnosed but then "number started going down so my doctor said i didnt have diabetes"  . Dry eyes    uses Systane Eye drops daily as needed  . GERD (gastroesophageal reflux disease) 12/06/2011   takes Nexium daily  . Headache(784.0)    occasionally-d/t congestion  . Heart murmur   . History of bronchitis 6-7 yrs ago  . Hyperlipidemia   . Hypertension    takes Metoprolol and Diovan daily  . Hypothyroidism 12/06/2011   takes Synthroid daily  . Joint pain   . Multiple allergies    takes Zyrtec daily;uses Nasonex daily as needed  . Nocturia   . Numbness    weakness-right hand  . OSA on CPAP   . Pancreatitis   . Peripheral edema    takes Furosemide daily  . Shingles   . Urinary frequency   . Venous insufficiency of leg     Family History  Problem Relation Age of Onset  . Hypertension Mother   . Hypertension Father   . Hypertension Sister   . Kidney disease Sister        [facility] TRANSPLANT  . Thyroid disease Sister   . Thyroid disease Sister   . Hypertension Sister   . Breast cancer Maternal Aunt        >50; passed away from it  . Breast cancer Maternal Aunt        >50    Past Surgical History:  Procedure Laterality Date  . ABDOMINAL HYSTERECTOMY     uterine prolapse  . CARDIAC SURGERY    . CHOLECYSTECTOMY    . COLONOSCOPY    . ESOPHAGOGASTRODUODENOSCOPY  5-42yrs ago  . FOOT SURGERY Bilateral   . KNEE ARTHROSCOPY    . LAPAROSCOPIC GASTRIC SLEEVE RESECTION N/A 08/23/2016   Procedure: LAPAROSCOPIC GASTRIC SLEEVE RESECTION WITH UPPER ENDO;  Surgeon: Luretha Murphy, MD;  Location: [facility];  Service: General;  Laterality: N/A;  . POSTERIOR LUMBAR FUSION  03/10/2009   L3-4 and L3-4 fusion, Dr. Hilda Lias  . SHOULDER ARTHROSCOPY WITH ROTATOR CUFF REPAIR AND SUBACROMIAL DECOMPRESSION Right 08/23/2013   Procedure: RIGHT SHOULDER  ARTHROSCOPY WITH  SUBACROMIAL DECOMPRESSION DISTAL CLAVICLE RESECTION AND  ROTATOR CUFF REPAIR ;  Surgeon: Senaida Lange, MD;  Location: [facility];  Service: Orthopedics;  Laterality: Right;  . TUBAL LIGATION     Social History   Occupational History    Employer: [employer]    Comment: [employer]  Tobacco Use  . Smoking status: Former Smoker    Packs/day: 0.75    Years: 40.00    Pack years: 30.00    Types: Cigarettes    Quit date: 1993  Years since quitting: 29.0  . Smokeless tobacco: Never Used  . Tobacco comment: quit smoking in 1993  Vaping Use  . Vaping Use: Never used  Substance and Sexual Activity  . Alcohol use: Yes    Alcohol/week: 0.0 standard drinks    Comment: rarely;; mixed drink   . Drug use: No  . Sexual activity: Yes    Partners: Male    Birth control/protection: Surgical    Comment: Hysterectomy

## 2020-01-31 ENCOUNTER — Other Ambulatory Visit (HOSPITAL_COMMUNITY): Payer: Self-pay | Admitting: Internal Medicine

## 2020-02-03 ENCOUNTER — Other Ambulatory Visit: Payer: Self-pay

## 2020-02-03 ENCOUNTER — Ambulatory Visit (HOSPITAL_COMMUNITY)
Admission: EM | Admit: 2020-02-03 | Discharge: 2020-02-03 | Disposition: A | Discharge status: Home / Self Care | Payer: 59 | Attending: Family Medicine | Admitting: Family Medicine

## 2020-02-03 ENCOUNTER — Other Ambulatory Visit (HOSPITAL_COMMUNITY): Payer: Self-pay | Admitting: Family Medicine

## 2020-02-03 DIAGNOSIS — Z20822 Contact with and (suspected) exposure to covid-19: Secondary | ICD-10-CM | POA: Diagnosis not present

## 2020-02-03 DIAGNOSIS — X58XXXA Exposure to other specified factors, initial encounter: Secondary | ICD-10-CM | POA: Diagnosis not present

## 2020-02-03 DIAGNOSIS — R519 Headache, unspecified: Secondary | ICD-10-CM | POA: Diagnosis not present

## 2020-02-03 DIAGNOSIS — S161XXA Strain of muscle, fascia and tendon at neck level, initial encounter: Secondary | ICD-10-CM | POA: Diagnosis not present

## 2020-02-03 DIAGNOSIS — I10 Essential (primary) hypertension: Secondary | ICD-10-CM | POA: Insufficient documentation

## 2020-02-03 LAB — SARS CORONAVIRUS 2 (TAT 6-24 HRS): SARS Coronavirus 2: NEGATIVE

## 2020-02-03 MED ORDER — PREDNISONE 20 MG PO TABS: 40.0000 mg | ORAL_TABLET | Freq: Every day | ORAL | 0 refills | Status: DC

## 2020-02-03 NOTE — Discharge Instructions (Addendum)
Take your prescribed muscle relaxers as needed for neck soreness/stiffness

## 2020-02-03 NOTE — ED Triage Notes (Signed)
Pt reports for the last week and a half she has had a HA and high BP. Pt reports she has been taking her BP meds.

## 2020-02-04 NOTE — ED Provider Notes (Signed)
[facility]    CSN: [id number] Arrival date & time: 02/03/20  1050      History   Chief Complaint Chief Complaint  Patient presents with  . Headache  . Hypertension    HPI [name] is a 60 y.o. female.   Here today with over a week of frontal headache that feels like squeezing around her temples and left sided neck soreness worst with movement. Denies visual changes, dizziness, mental status changes, syncope, injury to area. Not trying anything OTC for sxs. She states her BP has been a bit high the past few days and wonders if this is causing her headache. Has been taking her medications faithfully. No known hx of headache disorders.        Past Medical History:  Diagnosis Date  . Anemia when she was young  . Arthritis   . Congestion of nasal sinus   . Constipation    takes Colace every other day  . DDD (degenerative disc disease), lumbar   . Diabetes mellitus without complication ([place])    per pt had been diagnosed but then "number started going down so my doctor said i didnt have diabetes"  . Dry eyes    uses Systane Eye drops daily as needed  . GERD (gastroesophageal reflux disease) 12/06/2011   takes Nexium daily  . Headache(784.0)    occasionally-d/t congestion  . Heart murmur   . History of bronchitis 6-7 yrs ago  . Hyperlipidemia   . Hypertension    takes Metoprolol and Diovan daily  . Hypothyroidism 12/06/2011   takes Synthroid daily  . Joint pain   . Multiple allergies    takes Zyrtec daily;uses Nasonex daily as needed  . Nocturia   . Numbness    weakness-right hand  . OSA on CPAP   . Pancreatitis   . Peripheral edema    takes Furosemide daily  . Shingles   . Urinary frequency   . Venous insufficiency of leg     Patient Active Problem List   Diagnosis Date Noted  . Chronic pain syndrome 05/04/2019  . Lumbar radiculopathy 12/18/2018  . Spinal stenosis of lumbar region with neurogenic claudication 12/18/2018  . Post  laminectomy syndrome 12/18/2018  . Bilateral primary osteoarthritis of knee 12/27/2016  . 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 Prediabetes 12/03/2015  . OSA on CPAP 02/19/2015  . Morbid obesity ([place]) 10/09/2014  . Pulmonary HTN ([place]) 05/12/2013  . Bilateral lower extremity edema 05/12/2013  . Morbid obesity with BMI of 50.0-59.9, adult ([place])   . Back pain, hx of prior back surgery 12/09/2011  . Pancreatitis, mild, CT negative 12/08/2011  . Fatty liver 12/08/2011  . Substernal chest pain, suspected secondary to acute pancreatitis 12/06/2011  . Hypertension 12/06/2011  . Hypokalemia 12/06/2011  . Hypothyroidism 12/06/2011  . Anxiety 12/06/2011  . BV (bacterial vaginosis)   . Hyperlipidemia     Past Surgical History:  Procedure Laterality Date  . ABDOMINAL HYSTERECTOMY     uterine prolapse  . CARDIAC SURGERY    . CHOLECYSTECTOMY    . COLONOSCOPY    . ESOPHAGOGASTRODUODENOSCOPY  5-52yrs ago  . FOOT SURGERY Bilateral   . KNEE ARTHROSCOPY    . LAPAROSCOPIC GASTRIC SLEEVE RESECTION N/A 08/23/2016   Procedure: LAPAROSCOPIC GASTRIC SLEEVE RESECTION WITH UPPER ENDO;  Surgeon: Johnathan Hausen, MD;  Location: [facility];  Service: General;  Laterality: N/A;  . POSTERIOR LUMBAR FUSION  03/10/2009   L3-4 and L3-4 fusion, Dr.  Leeroy Cha  . SHOULDER ARTHROSCOPY WITH ROTATOR CUFF REPAIR AND SUBACROMIAL DECOMPRESSION Right 08/23/2013   Procedure: RIGHT SHOULDER ARTHROSCOPY WITH  SUBACROMIAL DECOMPRESSION DISTAL CLAVICLE RESECTION AND  ROTATOR CUFF REPAIR ;  Surgeon: Marin Shutter, MD;  Location: [facility];  Service: Orthopedics;  Laterality: Right;  . TUBAL LIGATION      OB History    Gravida  2   Para  2   Term  2   Preterm      AB      Living  2     SAB      IAB      Ectopic      Multiple      Live Births  2            Home Medications    Prior to Admission medications   Medication Sig Start Date End Date Taking? Authorizing Provider   albuterol (PROVENTIL HFA;VENTOLIN HFA) 108 (90 BASE) MCG/ACT inhaler Inhale 1-2 puffs into the lungs every 6 (six) hours as needed for wheezing or shortness of breath. 10/09/14  Yes Croitoru, Mihai, MD  aspirin EC 81 MG tablet Take 81 mg by mouth daily.    Yes [provider]  Azelastine HCl 137 MCG/SPRAY SOLN  10/14/17  Yes [provider]  baclofen (LIORESAL) 10 MG tablet TAKE 1/2-1 TABLET BY MOUTH EVERY 8 HOURS AS NEEDED FOR SPASMS 12/25/19  Yes Magnus Sinning, MD  betamethasone valerate ointment (VALISONE) 0.1 % Apply a pea sized amount topically BID for up to 2 weeks. Not for daily long term use. 06/28/17  Yes Salvadore Dom, MD  diazepam (VALIUM) 5 MG tablet Take 0.5-1 tablets (2.5-5 mg total) by mouth every 6 (six) hours as needed for muscle spasms or sedation. 08/23/13  Yes Shuford, Olivia Mackie, PA-C  diazepam (VALIUM) 5 MG tablet Take 1 by mouth 1 hour  pre-procedure with very light food. May bring 2nd tablet to appointment. 06/21/19  Yes Magnus Sinning, MD  esomeprazole (NEXIUM) 40 MG capsule Take 40 mg by mouth daily.    Yes [provider]  fluticasone (FLONASE) 50 MCG/ACT nasal spray Place 1 spray into both nostrils daily as needed for allergies.  01/06/16  Yes [provider]  furosemide (LASIX) 20 MG tablet Take 3 tablets (60 mg total) by mouth as needed. NEED OV. 03/07/19  Yes Croitoru, Mihai, MD  gabapentin (NEURONTIN) 300 MG capsule TAKE 1 CAPSULE BY MOUTH THREE TIMES DAILY 09/25/19  Yes Magnus Sinning, MD  ibuprofen (ADVIL,MOTRIN) 600 MG tablet  08/09/17  Yes [provider]  levothyroxine (SYNTHROID, LEVOTHROID) 75 MCG tablet Take 75 mcg by mouth daily before breakfast.   Yes [provider]  LINZESS 72 MCG capsule Take 72 mcg by mouth daily. 05/26/19  Yes [provider]  lisinopril (ZESTRIL) 40 MG tablet Take 1 tablet (40 mg total) by mouth daily. 11/05/19  Yes Croitoru, Mihai, MD  loratadine (CLARITIN) 10 MG tablet Take  10 mg by mouth daily.   Yes [provider]  magnesium oxide (MAG-OX) 400 MG tablet Take 400 mg by mouth daily.   Yes [provider]  meloxicam (MOBIC) 15 MG tablet  04/07/17  Yes [provider]  methocarbamol (ROBAXIN) 500 MG tablet TAKE 1 TABLET BY MOUTH EVERY 8 HOURS AS NEEDED FOR MUSCLE SPASMS 10/04/19  Yes Magnus Sinning, MD  metoprolol tartrate (LOPRESSOR) 25 MG tablet Take 25 mg by mouth 2 (two) times daily.   Yes [provider]  nystatin cream (MYCOSTATIN) Apply 1 application topically 2 (two) times daily. Apply to affected area BID for up to 7 days. 07/09/19  Yes Salvadore Dom, MD  Omega-3 Fatty Acids (FISH OIL) 1000 MG CAPS Take 1,000 mg by mouth daily.   Yes [provider]  ondansetron (ZOFRAN) 4 MG tablet Take 1 tablet (4 mg total) by mouth every 8 (eight) hours as needed for nausea or vomiting. 11/02/17  Yes Hyatt, Max T, DPM  predniSONE (DELTASONE) 20 MG tablet Take 2 tablets (40 mg total) by mouth daily with breakfast. 02/03/20  Yes Volney American, PA-C  vitamin C (ASCORBIC ACID) 500 MG tablet Take 500 mg by mouth daily.   Yes [provider]  VITAMIN D PO Take by mouth.   Yes [provider]  cephALEXin (KEFLEX) 500 MG capsule Take 1 capsule (500 mg total) by mouth 2 (two) times daily. 07/11/19   Magnus Sinning, MD  potassium chloride SA (K-DUR) 20 MEQ tablet TAKE 1 TABLET (20 MEQ TOTAL) BY MOUTH DAILY. 08/01/18 10/30/18  Croitoru, Dani Gobble, MD    Family History Family History  Problem Relation Age of Onset  . Hypertension Mother   . Hypertension Father   . Hypertension Sister   . Kidney disease Sister        [facility] TRANSPLANT  . Thyroid disease Sister   . Thyroid disease Sister   . Hypertension Sister   . Breast cancer Maternal Aunt        >50; passed away from it  . Breast cancer Maternal Aunt        >50    Social History Social History   Tobacco Use  . Smoking status: Former Smoker     Packs/day: 0.75    Years: 40.00    Pack years: 30.00    Types: Cigarettes    Quit date: 1993    Years since quitting: 29.0  . Smokeless tobacco: Never Used  . Tobacco comment: quit smoking in 1993  Vaping Use  . Vaping Use: Never used  Substance Use Topics  . Alcohol use: Yes    Alcohol/week: 0.0 standard drinks    Comment: rarely;; mixed drink   . Drug use: No     Allergies   Metronidazole, Adhesive [tape], Latex, Penicillins, and Vesicare [solifenacin]   Review of Systems Review of Systems PER HPI   Physical Exam Triage Vital Signs ED Triage Vitals  Enc Vitals Group     BP 02/03/20 1222 (!) 157/83     Pulse Rate 02/03/20 1222 (!) 55     Resp 02/03/20 1222 18     Temp 02/03/20 1222 97.7 F (36.5 C)     Temp Source 02/03/20 1222 Oral     SpO2 02/03/20 1222 100 %     Weight --      Height --      Head Circumference --      Peak Flow --      Pain Score 02/03/20 1224 10     Pain Loc --      Pain Edu? --      Excl. in [place]? --    No data found.  Updated Vital Signs BP (!) 157/83 (BP Location: Right Arm)   Pulse (!) 55   Temp 97.7 F (36.5 C) (Oral)   Resp 18   SpO2 100%   Visual Acuity Right Eye Distance:   Left Eye Distance:   Bilateral Distance:    Right Eye Near:   Left  Eye Near:    Bilateral Near:     Physical Exam Vitals and nursing note reviewed.  Constitutional:      Appearance: Normal appearance. She is not ill-appearing.  HENT:     Head: Atraumatic.     Right Ear: Tympanic membrane normal.     Left Ear: Tympanic membrane normal.     Nose: Nose normal.     Mouth/Throat:     Mouth: Mucous membranes are moist.     Pharynx: Oropharynx is clear.  Eyes:     Extraocular Movements: Extraocular movements intact.     Conjunctiva/sclera: Conjunctivae normal.     Pupils: Pupils are equal, round, and reactive to light.  Cardiovascular:     Rate and Rhythm: Normal rate and regular rhythm.     Heart sounds: Normal heart sounds.  Pulmonary:      Effort: Pulmonary effort is normal.     Breath sounds: Normal breath sounds.  Musculoskeletal:        General: Tenderness (ttp left SCM and trapezius) present. Normal range of motion.     Cervical back: Normal range of motion and neck supple.  Skin:    General: Skin is warm and dry.  Neurological:     General: No focal deficit present.     Mental Status: She is alert and oriented to person, place, and time.  Psychiatric:        Mood and Affect: Mood normal.        Thought Content: Thought content normal.        Judgment: Judgment normal.      UC Treatments / Results  Labs (all labs ordered are listed, but only abnormal results are displayed) Labs Reviewed  SARS CORONAVIRUS 2 (TAT 6-24 HRS)    EKG   Radiology No results found.  Procedures Procedures (including critical care time)  Medications Ordered in UC Medications - No data to display  Initial Impression / Assessment and Plan / UC Course  I have reviewed the triage vital signs and the nursing notes.  Pertinent labs & imaging results that were available during my care of the patient were reviewed by me and considered in my medical decision making (see chart for details).     BP here mildly elevated, but low suspicion for this causing her headaches given quality and location. Did discuss close PCP f/u for re-evaluation of her BPs and regimen. Suspect her headache is related to tension or sinus pressure, has muscle relaxers at home that she is instructed to take and will also start prednisone burst, massage, heat, stress control. Strict ED precautions for worsening sxs.   Final Clinical Impressions(s) / UC Diagnoses   Final diagnoses:  Acute nonintractable headache, unspecified headache type  Strain of neck muscle, initial encounter  Essential hypertension     Discharge Instructions     Take your prescribed muscle relaxers as needed for neck soreness/stiffness    ED Prescriptions    Medication Sig  Dispense Auth. Provider   predniSONE (DELTASONE) 20 MG tablet Take 2 tablets (40 mg total) by mouth daily with breakfast. 10 tablet Volney American, [place]     PDMP not reviewed this encounter.   Volney American, [place] 02/04/20 1840

## 2020-02-06 ENCOUNTER — Other Ambulatory Visit: Payer: Self-pay

## 2020-02-06 ENCOUNTER — Ambulatory Visit
Admission: RE | Admit: 2020-02-06 | Discharge: 2020-02-06 | Disposition: A | Discharge status: Home / Self Care | Payer: 59 | Source: Ambulatory Visit | Attending: Internal Medicine | Admitting: Internal Medicine

## 2020-02-06 ENCOUNTER — Other Ambulatory Visit: Payer: Self-pay | Admitting: Internal Medicine

## 2020-02-06 DIAGNOSIS — R928 Other abnormal and inconclusive findings on diagnostic imaging of breast: Secondary | ICD-10-CM | POA: Diagnosis not present

## 2020-02-06 DIAGNOSIS — N6322 Unspecified lump in the left breast, upper inner quadrant: Secondary | ICD-10-CM | POA: Diagnosis not present

## 2020-02-06 DIAGNOSIS — N63 Unspecified lump in unspecified breast: Secondary | ICD-10-CM

## 2020-02-08 ENCOUNTER — Other Ambulatory Visit (HOSPITAL_COMMUNITY): Payer: Self-pay | Admitting: Internal Medicine

## 2020-02-08 ENCOUNTER — Other Ambulatory Visit: Payer: 59

## 2020-02-08 DIAGNOSIS — Z7189 Other specified counseling: Secondary | ICD-10-CM | POA: Diagnosis not present

## 2020-02-08 DIAGNOSIS — R928 Other abnormal and inconclusive findings on diagnostic imaging of breast: Secondary | ICD-10-CM | POA: Diagnosis not present

## 2020-02-08 DIAGNOSIS — M5136 Other intervertebral disc degeneration, lumbar region: Secondary | ICD-10-CM | POA: Diagnosis not present

## 2020-02-08 DIAGNOSIS — M25512 Pain in left shoulder: Secondary | ICD-10-CM | POA: Diagnosis not present

## 2020-02-08 DIAGNOSIS — J0110 Acute frontal sinusitis, unspecified: Secondary | ICD-10-CM | POA: Diagnosis not present

## 2020-02-08 DIAGNOSIS — E1169 Type 2 diabetes mellitus with other specified complication: Secondary | ICD-10-CM | POA: Diagnosis not present

## 2020-02-08 DIAGNOSIS — I10 Essential (primary) hypertension: Secondary | ICD-10-CM | POA: Diagnosis not present

## 2020-02-27 ENCOUNTER — Encounter: Payer: Self-pay | Admitting: Orthopaedic Surgery

## 2020-02-27 ENCOUNTER — Ambulatory Visit: Payer: 59 | Admitting: Orthopaedic Surgery

## 2020-02-27 ENCOUNTER — Other Ambulatory Visit: Payer: Self-pay

## 2020-02-27 VITALS — Ht 63.0 in | Wt 259.0 lb

## 2020-02-27 DIAGNOSIS — M170 Bilateral primary osteoarthritis of knee: Secondary | ICD-10-CM

## 2020-02-27 NOTE — Progress Notes (Signed)
Office Visit Note   Patient: [name]           Date of Birth: 1960/10/14           MRN: [id number] Visit Date: 02/27/2020              Requested by: Velna Hatchet, MD [address] [place],  [place] [address] PCP: Velna Hatchet, MD   Assessment & Plan: Visit Diagnoses:  1. Bilateral primary osteoarthritis of knee     Plan: [name] injured her right knee falling backwards after being tangled up with her dog.  She does have evidence of osteoarthritis by prior films and had a cortisone injection with good relief about a month ago.  She is not using any ambulatory aid and by exam does not have an effusion.  I do not think x-rays are necessary as I think she is just had an exacerbation of her arthritis.  She will try over-the-counter medicines and Voltaren gel.  Check back in a month for consideration of another injection if no better.  Again urged her to work on her weight  Follow-Up Instructions: Return if symptoms worsen or fail to improve.   Orders:  No orders of the defined types were placed in this encounter.  No orders of the defined types were placed in this encounter.     Procedures: No procedures performed   Clinical Data: No additional findings.   Subjective: Chief Complaint  Patient presents with  . Right Knee - Pain  Patient presents today for her right knee. She was here last in January of this year. She has been doing well until she slipped on ice three weeks ago and fell onto her left knee. She said that she did not fall onto her knees, but had to kneel on her right knee to get up. She has been having increased pain since. She said that it feels like it will give way while walking. She is taking Tylenol and Meloxicam for pain relief.   HPI  Review of Systems   Objective: Vital Signs: Ht 5\' 3"  (1.6 m)   Wt 259 lb (117.5 kg)   BMI 45.88 kg/m   Physical Exam Constitutional:      Appearance: She is well-developed and  well-nourished.  HENT:     Mouth/Throat:     Mouth: Oropharynx is clear and moist.  Eyes:     Extraocular Movements: EOM normal.     Pupils: Pupils are equal, round, and reactive to light.  Pulmonary:     Effort: Pulmonary effort is normal.  Skin:    General: Skin is warm and dry.  Neurological:     Mental Status: She is alert and oriented to person, place, and time.  Psychiatric:        Mood and Affect: Mood and affect normal.        Behavior: Behavior normal.     Ortho Exam awake alert and oriented x3.  Comfortable sitting.  Large knees but no obvious effusion right knee.  No localized areas of tenderness except minimally over the patella and the patella tendon.  Full quick active extension.  Flexed 90 degrees similar to the left knee.  No instability or tenderness over LCL or MCL.  Negative anterior drawer sign.  Skin intact.  No bruising.  No calf pain or distal edema  Specialty Comments:  No specialty comments available.  Imaging: No results found.   PMFS History: Patient Active Problem List   Diagnosis  Date Noted  . Chronic pain syndrome 05/04/2019  . Lumbar radiculopathy 12/18/2018  . Spinal stenosis of lumbar region with neurogenic claudication 12/18/2018  . Post laminectomy syndrome 12/18/2018  . Bilateral primary osteoarthritis of knee 12/27/2016  . 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 Prediabetes 12/03/2015  . OSA on CPAP 02/19/2015  . Morbid obesity ([place]) 10/09/2014  . Pulmonary HTN ([place]) 05/12/2013  . Bilateral lower extremity edema 05/12/2013  . Morbid obesity with BMI of 50.0-59.9, adult ([place])   . Back pain, hx of prior back surgery 12/09/2011  . Pancreatitis, mild, CT negative 12/08/2011  . Fatty liver 12/08/2011  . Substernal chest pain, suspected secondary to acute pancreatitis 12/06/2011  . Hypertension 12/06/2011  . Hypokalemia 12/06/2011  . Hypothyroidism 12/06/2011  . Anxiety 12/06/2011  . BV (bacterial vaginosis)   .  Hyperlipidemia    Past Medical History:  Diagnosis Date  . Anemia when she was young  . Arthritis   . Congestion of nasal sinus   . Constipation    takes Colace every other day  . DDD (degenerative disc disease), lumbar   . Diabetes mellitus without complication ([place])    per pt had been diagnosed but then "number started going down so my doctor said i didnt have diabetes"  . Dry eyes    uses Systane Eye drops daily as needed  . GERD (gastroesophageal reflux disease) 12/06/2011   takes Nexium daily  . Headache(784.0)    occasionally-d/t congestion  . Heart murmur   . History of bronchitis 6-7 yrs ago  . Hyperlipidemia   . Hypertension    takes Metoprolol and Diovan daily  . Hypothyroidism 12/06/2011   takes Synthroid daily  . Joint pain   . Multiple allergies    takes Zyrtec daily;uses Nasonex daily as needed  . Nocturia   . Numbness    weakness-right hand  . OSA on CPAP   . Pancreatitis   . Peripheral edema    takes Furosemide daily  . Shingles   . Urinary frequency   . Venous insufficiency of leg     Family History  Problem Relation Age of Onset  . Hypertension Mother   . Hypertension Father   . Hypertension Sister   . Kidney disease Sister        [facility] TRANSPLANT  . Thyroid disease Sister   . Thyroid disease Sister   . Hypertension Sister   . Breast cancer Maternal Aunt        >50; passed away from it  . Breast cancer Maternal Aunt        >50    Past Surgical History:  Procedure Laterality Date  . ABDOMINAL HYSTERECTOMY     uterine prolapse  . CARDIAC SURGERY    . CHOLECYSTECTOMY    . COLONOSCOPY    . ESOPHAGOGASTRODUODENOSCOPY  5-6yrs ago  . FOOT SURGERY Bilateral   . KNEE ARTHROSCOPY    . LAPAROSCOPIC GASTRIC SLEEVE RESECTION N/A 08/23/2016   Procedure: LAPAROSCOPIC GASTRIC SLEEVE RESECTION WITH UPPER ENDO;  Surgeon: Johnathan Hausen, MD;  Location: [facility];  Service: General;  Laterality: N/A;  . POSTERIOR LUMBAR FUSION  03/10/2009   L3-4 and L3-4  fusion, Dr. Leeroy Cha  . SHOULDER ARTHROSCOPY WITH ROTATOR CUFF REPAIR AND SUBACROMIAL DECOMPRESSION Right 08/23/2013   Procedure: RIGHT SHOULDER ARTHROSCOPY WITH  SUBACROMIAL DECOMPRESSION DISTAL CLAVICLE RESECTION AND  ROTATOR CUFF REPAIR ;  Surgeon: Marin Shutter, MD;  Location: [facility];  Service: Orthopedics;  Laterality: Right;  .  TUBAL LIGATION     Social History   Occupational History    Employer: [employer]    Comment: [employer]  Tobacco Use  . Smoking status: Former Smoker    Packs/day: 0.75    Years: 40.00    Pack years: 30.00    Types: Cigarettes    Quit date: 1993    Years since quitting: 29.1  . Smokeless tobacco: Never Used  . Tobacco comment: quit smoking in 1993  Vaping Use  . Vaping Use: Never used  Substance and Sexual Activity  . Alcohol use: Yes    Alcohol/week: 0.0 standard drinks    Comment: rarely;; mixed drink   . Drug use: No  . Sexual activity: Yes    Partners: Male    Birth control/protection: Surgical    Comment: Hysterectomy

## 2020-02-28 DIAGNOSIS — H2513 Age-related nuclear cataract, bilateral: Secondary | ICD-10-CM | POA: Diagnosis not present

## 2020-03-07 DIAGNOSIS — G4733 Obstructive sleep apnea (adult) (pediatric): Secondary | ICD-10-CM | POA: Diagnosis not present

## 2020-03-18 ENCOUNTER — Other Ambulatory Visit: Payer: Self-pay | Admitting: Physical Medicine and Rehabilitation

## 2020-03-18 DIAGNOSIS — M961 Postlaminectomy syndrome, not elsewhere classified: Secondary | ICD-10-CM

## 2020-03-18 DIAGNOSIS — G894 Chronic pain syndrome: Secondary | ICD-10-CM

## 2020-03-18 DIAGNOSIS — M5416 Radiculopathy, lumbar region: Secondary | ICD-10-CM

## 2020-03-31 DIAGNOSIS — G4733 Obstructive sleep apnea (adult) (pediatric): Secondary | ICD-10-CM | POA: Diagnosis not present

## 2020-04-01 ENCOUNTER — Other Ambulatory Visit: Payer: Self-pay

## 2020-04-01 ENCOUNTER — Ambulatory Visit: Payer: 59 | Admitting: Orthopaedic Surgery

## 2020-04-01 ENCOUNTER — Telehealth: Payer: Self-pay

## 2020-04-01 ENCOUNTER — Encounter: Payer: Self-pay | Admitting: Orthopaedic Surgery

## 2020-04-01 DIAGNOSIS — M1711 Unilateral primary osteoarthritis, right knee: Secondary | ICD-10-CM

## 2020-04-01 DIAGNOSIS — M170 Bilateral primary osteoarthritis of knee: Secondary | ICD-10-CM

## 2020-04-01 MED ORDER — LIDOCAINE HCL 1 % IJ SOLN
2.0000 mL | INTRAMUSCULAR | Status: AC | PRN
Start: 2020-04-01 — End: 2020-04-01
  Administered 2020-04-01: 2 mL

## 2020-04-01 MED ORDER — BUPIVACAINE HCL 0.25 % IJ SOLN
2.0000 mL | INTRAMUSCULAR | Status: AC | PRN
  Administered 2020-04-01: 2 mL via INTRA_ARTICULAR

## 2020-04-01 NOTE — Telephone Encounter (Signed)
Noted  

## 2020-04-01 NOTE — Progress Notes (Signed)
Office Visit Note   Patient: [name]           Date of Birth: 07/17/1960           MRN: [id number] Visit Date: 04/01/2020              Requested by: Velna Hatchet, MD [address] [place],  [place] [address] PCP: Velna Hatchet, MD   Assessment & Plan: Visit Diagnoses:  1. Bilateral primary osteoarthritis of knee     Plan: Recurrent symptoms of right knee osteoarthritis.  Has evidence of bilateral knee osteoarthritis by prior films.  Will inject the more symptomatic right knee with cortisone today and pre-CERT viscosupplementation for both knees .discussed the procedure at length  Follow-Up Instructions: Return Pre-CERT viscosupplementation both knees.   Orders:  Orders Placed This Encounter  Procedures  . Large Joint Inj: R knee   No orders of the defined types were placed in this encounter.     Procedures: Large Joint Inj: R knee on 04/01/2020 4:04 PM Indications: pain and diagnostic evaluation Details: 25 G 1.5 in needle, anteromedial approach  Arthrogram: No  Medications: 2 mL lidocaine 1 %; 2 mL bupivacaine 0.25 %  12 mg betamethasone injected with Xylocaine and Marcaine right knee Procedure, treatment alternatives, risks and benefits explained, specific risks discussed. Consent was given by the patient. Immediately prior to procedure a time out was called to verify the correct patient, procedure, equipment, support staff and site/side marked as required. Patient was prepped and draped in the usual sterile fashion.       Clinical Data: No additional findings.   Subjective: Chief Complaint  Patient presents with  . Right Knee - Pain  Patient presents today for right knee pain. She received her last cortisone injection on 01/30/2020. She is here today in hopes of getting her knee injected again.  HPI  Review of Systems   Objective: Vital Signs: There were no vitals taken for this visit.  Physical Exam Constitutional:       Appearance: She is well-developed and well-nourished.  HENT:     Mouth/Throat:     Mouth: Oropharynx is clear and moist.  Eyes:     Extraocular Movements: EOM normal.     Pupils: Pupils are equal, round, and reactive to light.  Pulmonary:     Effort: Pulmonary effort is normal.  Skin:    General: Skin is warm and dry.  Neurological:     Mental Status: She is alert and oriented to person, place, and time.  Psychiatric:        Mood and Affect: Mood and affect normal.        Behavior: Behavior normal.     Ortho Exam right knee with predominant medial joint pain.  No effusion.  Full extension about 100 degrees of flexion without instability.  Little bit of patella crepitation.  Specialty Comments:  No specialty comments available.  Imaging: No results found.   PMFS History: Patient Active Problem List   Diagnosis Date Noted  . Chronic pain syndrome 05/04/2019  . Lumbar radiculopathy 12/18/2018  . Spinal stenosis of lumbar region with neurogenic claudication 12/18/2018  . Post laminectomy syndrome 12/18/2018  . Bilateral primary osteoarthritis of knee 12/27/2016  . 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 Prediabetes 12/03/2015  . OSA on CPAP 02/19/2015  . Morbid obesity ([place]) 10/09/2014  . Pulmonary HTN ([place]) 05/12/2013  . Bilateral lower extremity edema 05/12/2013  . Morbid obesity with BMI of 50.0-59.9,  adult [facility])   . Back pain, hx of prior back surgery 12/09/2011  . Pancreatitis, mild, CT negative 12/08/2011  . Fatty liver 12/08/2011  . Substernal chest pain, suspected secondary to acute pancreatitis 12/06/2011  . Hypertension 12/06/2011  . Hypokalemia 12/06/2011  . Hypothyroidism 12/06/2011  . Anxiety 12/06/2011  . BV (bacterial vaginosis)   . Hyperlipidemia    Past Medical History:  Diagnosis Date  . Anemia when she was young  . Arthritis   . Congestion of nasal sinus   . Constipation    takes Colace every other day  . DDD  (degenerative disc disease), lumbar   . Diabetes mellitus without complication ([place])    per pt had been diagnosed but then "number started going down so my doctor said i didnt have diabetes"  . Dry eyes    uses Systane Eye drops daily as needed  . GERD (gastroesophageal reflux disease) 12/06/2011   takes Nexium daily  . Headache(784.0)    occasionally-d/t congestion  . Heart murmur   . History of bronchitis 6-7 yrs ago  . Hyperlipidemia   . Hypertension    takes Metoprolol and Diovan daily  . Hypothyroidism 12/06/2011   takes Synthroid daily  . Joint pain   . Multiple allergies    takes Zyrtec daily;uses Nasonex daily as needed  . Nocturia   . Numbness    weakness-right hand  . OSA on CPAP   . Pancreatitis   . Peripheral edema    takes Furosemide daily  . Shingles   . Urinary frequency   . Venous insufficiency of leg     Family History  Problem Relation Age of Onset  . Hypertension Mother   . Hypertension Father   . Hypertension Sister   . Kidney disease Sister        [facility] TRANSPLANT  . Thyroid disease Sister   . Thyroid disease Sister   . Hypertension Sister   . Breast cancer Maternal Aunt        >50; passed away from it  . Breast cancer Maternal Aunt        >50    Past Surgical History:  Procedure Laterality Date  . ABDOMINAL HYSTERECTOMY     uterine prolapse  . CARDIAC SURGERY    . CHOLECYSTECTOMY    . COLONOSCOPY    . ESOPHAGOGASTRODUODENOSCOPY  5-10yrs ago  . FOOT SURGERY Bilateral   . KNEE ARTHROSCOPY    . LAPAROSCOPIC GASTRIC SLEEVE RESECTION N/A 08/23/2016   Procedure: LAPAROSCOPIC GASTRIC SLEEVE RESECTION WITH UPPER ENDO;  Surgeon: Johnathan Hausen, MD;  [facility];  Service: General;  Laterality: N/A;  . POSTERIOR LUMBAR FUSION  03/10/2009   L3-4 and L3-4 fusion, Dr. Leeroy Cha  . SHOULDER ARTHROSCOPY WITH ROTATOR CUFF REPAIR AND SUBACROMIAL DECOMPRESSION Right 08/23/2013   Procedure: RIGHT SHOULDER ARTHROSCOPY WITH  SUBACROMIAL  DECOMPRESSION DISTAL CLAVICLE RESECTION AND  ROTATOR CUFF REPAIR ;  Surgeon: Marin Shutter, MD;  Location: [facility];  Service: Orthopedics;  Laterality: Right;  . TUBAL LIGATION     Social History   Occupational History    Employer: [employer]    Comment: [employer]  Tobacco Use  . Smoking status: Former Smoker    Packs/day: 0.75    Years: 40.00    Pack years: 30.00    Types: Cigarettes    Quit date: 1993    Years since quitting: 29.2  . Smokeless tobacco: Never Used  . Tobacco comment: quit smoking in [place]  Use  . Vaping Use: Never used  Substance and Sexual Activity  . Alcohol use: Yes    Alcohol/week: 0.0 standard drinks    Comment: rarely;; mixed drink   . Drug use: No  . Sexual activity: Yes    Partners: Male    Birth control/protection: Surgical    Comment: Hysterectomy

## 2020-04-01 NOTE — Telephone Encounter (Signed)
Please precert for bilateral visco injections. This is Dr.Whitfield's patient. Thanks!

## 2020-04-02 ENCOUNTER — Telehealth: Payer: Self-pay

## 2020-04-02 NOTE — Telephone Encounter (Signed)
Submitted VOB for Euflexxa, bilateral knee. Pending BV.

## 2020-04-04 ENCOUNTER — Other Ambulatory Visit: Payer: Self-pay | Admitting: Physical Medicine and Rehabilitation

## 2020-04-04 ENCOUNTER — Other Ambulatory Visit (HOSPITAL_COMMUNITY): Payer: Self-pay | Admitting: Internal Medicine

## 2020-04-07 NOTE — Telephone Encounter (Signed)
Dr. Ernestina Patches is out of the office until 3/21. Patient also sees Dr. Durward Fortes. Would Dr. Durward Fortes be able to advise on this refill?

## 2020-04-08 ENCOUNTER — Telehealth: Payer: Self-pay

## 2020-04-08 NOTE — Telephone Encounter (Signed)
Approved for Euflexxa series, bilateral knee. Buy & Bill Covered at 100% of the allowable amount. Co-pay of $60.00 No PA required  Appt. 04/15/2020 with Dr. Durward Fortes

## 2020-04-09 DIAGNOSIS — I10 Essential (primary) hypertension: Secondary | ICD-10-CM | POA: Diagnosis not present

## 2020-04-09 DIAGNOSIS — Z9689 Presence of other specified functional implants: Secondary | ICD-10-CM | POA: Diagnosis not present

## 2020-04-09 DIAGNOSIS — Z6841 Body Mass Index (BMI) 40.0 and over, adult: Secondary | ICD-10-CM | POA: Diagnosis not present

## 2020-04-10 ENCOUNTER — Other Ambulatory Visit: Payer: Self-pay | Admitting: Orthopaedic Surgery

## 2020-04-10 NOTE — Telephone Encounter (Signed)
Ok to refill 

## 2020-04-14 ENCOUNTER — Other Ambulatory Visit (HOSPITAL_COMMUNITY): Payer: Self-pay | Admitting: Internal Medicine

## 2020-04-15 ENCOUNTER — Other Ambulatory Visit: Payer: Self-pay

## 2020-04-15 ENCOUNTER — Encounter: Payer: Self-pay | Admitting: Orthopaedic Surgery

## 2020-04-15 ENCOUNTER — Ambulatory Visit (INDEPENDENT_AMBULATORY_CARE_PROVIDER_SITE_OTHER): Payer: 59 | Admitting: Orthopaedic Surgery

## 2020-04-15 VITALS — Ht 63.0 in | Wt 259.0 lb

## 2020-04-15 DIAGNOSIS — M170 Bilateral primary osteoarthritis of knee: Secondary | ICD-10-CM | POA: Diagnosis not present

## 2020-04-15 MED ORDER — SODIUM HYALURONATE (VISCOSUP) 20 MG/2ML IX SOSY
20.0000 mg | PREFILLED_SYRINGE | INTRA_ARTICULAR | Status: AC | PRN
  Administered 2020-04-15: 20 mg via INTRA_ARTICULAR

## 2020-04-15 NOTE — Progress Notes (Signed)
Office Visit Note   Patient: [name]           Date of Birth: [birth year]           MRN: [id number] Visit Date: 04/15/2020              Requested by: Velna Hatchet, MD [address] [place],  [place] [address] PCP: Velna Hatchet, MD   Assessment & Plan: Visit Diagnoses:  1. Bilateral primary osteoarthritis of knee     Plan: First Euflexxa injection both knees.  Return weekly for the next 2 weeks to complete the series  Follow-Up Instructions: Return in about 1 week (around 04/22/2020).   Orders:  Orders Placed This Encounter  Procedures  . Large Joint Inj: bilateral knee   No orders of the defined types were placed in this encounter.     Procedures: Large Joint Inj: bilateral knee on 04/15/2020 4:18 PM Indications: pain and joint swelling Details: 25 G 1.5 in needle, anteromedial approach  Arthrogram: No  Medications (Right): 20 mg Sodium Hyaluronate 20 MG/2ML Medications (Left): 20 mg Sodium Hyaluronate 20 MG/2ML Outcome: tolerated well, no immediate complications Procedure, treatment alternatives, risks and benefits explained, specific risks discussed. Consent was given by the patient. Immediately prior to procedure a time out was called to verify the correct patient, procedure, equipment, support staff and site/side marked as required. Patient was prepped and draped in the usual sterile fashion.       Clinical Data: No additional findings.   Subjective: Chief Complaint  Patient presents with  . Left Knee - Pain    Bilateral Euflexxa started 04/15/2020  . Right Knee - Pain  Patient presents today for the first Euflexxa injections in both knees.   HPI  Review of Systems   Objective: Vital Signs: Ht 5\' 3"  (1.6 m)   Wt 259 lb (117.5 kg)   BMI 45.88 kg/m   Physical Exam  Ortho Exam neither knee was hot red warm or swollen.  Large knees.  Predominate mild medial joint pain  Specialty Comments:  No specialty comments  available.  Imaging: No results found.   PMFS History: Patient Active Problem List   Diagnosis Date Noted  . Chronic pain syndrome 05/04/2019  . Lumbar radiculopathy 12/18/2018  . Spinal stenosis of lumbar region with neurogenic claudication 12/18/2018  . Post laminectomy syndrome 12/18/2018  . Bilateral primary osteoarthritis of knee 12/27/2016  . 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 Prediabetes 12/03/2015  . OSA on CPAP 02/19/2015  . Morbid obesity ([place]) 10/09/2014  . Pulmonary HTN ([place]) 05/12/2013  . Bilateral lower extremity edema 05/12/2013  . Morbid obesity with BMI of 50.0-59.9, adult ([place])   . Back pain, hx of prior back surgery 12/09/2011  . Pancreatitis, mild, CT negative 12/08/2011  . Fatty liver 12/08/2011  . Substernal chest pain, suspected secondary to acute pancreatitis 12/06/2011  . Hypertension 12/06/2011  . Hypokalemia 12/06/2011  . Hypothyroidism 12/06/2011  . Anxiety 12/06/2011  . BV (bacterial vaginosis)   . Hyperlipidemia    Past Medical History:  Diagnosis Date  . Anemia when she was young  . Arthritis   . Congestion of nasal sinus   . Constipation    takes Colace every other day  . DDD (degenerative disc disease), lumbar   . Diabetes mellitus without complication ([place])    per pt had been diagnosed but then "number started going down so my doctor said i didnt have diabetes"  . Dry eyes  uses Systane Eye drops daily as needed  . GERD (gastroesophageal reflux disease) 12/06/2011   takes Nexium daily  . Headache(784.0)    occasionally-d/t congestion  . Heart murmur   . History of bronchitis 6-7 yrs ago  . Hyperlipidemia   . Hypertension    takes Metoprolol and Diovan daily  . Hypothyroidism 12/06/2011   takes Synthroid daily  . Joint pain   . Multiple allergies    takes Zyrtec daily;uses Nasonex daily as needed  . Nocturia   . Numbness    weakness-right hand  . OSA on CPAP   . Pancreatitis   . Peripheral edema     takes Furosemide daily  . Shingles   . Urinary frequency   . Venous insufficiency of leg     Family History  Problem Relation Age of Onset  . Hypertension Mother   . Hypertension Father   . Hypertension Sister   . Kidney disease Sister        [facility] TRANSPLANT  . Thyroid disease Sister   . Thyroid disease Sister   . Hypertension Sister   . Breast cancer Maternal Aunt        >50; passed away from it  . Breast cancer Maternal Aunt        >50    Past Surgical History:  Procedure Laterality Date  . ABDOMINAL HYSTERECTOMY     uterine prolapse  . CARDIAC SURGERY    . CHOLECYSTECTOMY    . COLONOSCOPY    . ESOPHAGOGASTRODUODENOSCOPY  5-71yrs ago  . FOOT SURGERY Bilateral   . KNEE ARTHROSCOPY    . LAPAROSCOPIC GASTRIC SLEEVE RESECTION N/A 08/23/2016   Procedure: LAPAROSCOPIC GASTRIC SLEEVE RESECTION WITH UPPER ENDO;  Surgeon: Johnathan Hausen, MD;  [facility];  Service: General;  Laterality: N/A;  . POSTERIOR LUMBAR FUSION  03/10/2009   L3-4 and L3-4 fusion, Dr. Leeroy Cha  . SHOULDER ARTHROSCOPY WITH ROTATOR CUFF REPAIR AND SUBACROMIAL DECOMPRESSION Right 08/23/2013   Procedure: RIGHT SHOULDER ARTHROSCOPY WITH  SUBACROMIAL DECOMPRESSION DISTAL CLAVICLE RESECTION AND  ROTATOR CUFF REPAIR ;  Surgeon: Marin Shutter, MD;  Location: [facility];  Service: Orthopedics;  Laterality: Right;  . TUBAL LIGATION     Social History   Occupational History    Employer: [employer]    Comment: [employer]  Tobacco Use  . Smoking status: Former Smoker    Packs/day: 0.75    Years: 40.00    Pack years: 30.00    Types: Cigarettes    Quit date: 1993    Years since quitting: 29.2  . Smokeless tobacco: Never Used  . Tobacco comment: quit smoking in 1993  Vaping Use  . Vaping Use: Never used  Substance and Sexual Activity  . Alcohol use: Yes    Alcohol/week: 0.0 standard drinks    Comment: rarely;; mixed drink   . Drug use: No  . Sexual activity: Yes    Partners: Male    Birth  control/protection: Surgical    Comment: Hysterectomy

## 2020-04-22 ENCOUNTER — Ambulatory Visit (INDEPENDENT_AMBULATORY_CARE_PROVIDER_SITE_OTHER): Payer: 59 | Admitting: Orthopaedic Surgery

## 2020-04-22 ENCOUNTER — Other Ambulatory Visit: Payer: Self-pay

## 2020-04-22 ENCOUNTER — Encounter: Payer: Self-pay | Admitting: Orthopaedic Surgery

## 2020-04-22 DIAGNOSIS — M170 Bilateral primary osteoarthritis of knee: Secondary | ICD-10-CM | POA: Diagnosis not present

## 2020-04-22 MED ORDER — SODIUM HYALURONATE (VISCOSUP) 20 MG/2ML IX SOSY
20.0000 mg | PREFILLED_SYRINGE | INTRA_ARTICULAR | Status: AC | PRN
  Administered 2020-04-22: 20 mg via INTRA_ARTICULAR

## 2020-04-22 NOTE — Progress Notes (Signed)
Office Visit Note   Patient: [name]           Date of Birth: [birth year]           MRN: [id number] Visit Date: 04/22/2020              Requested by: Velna Hatchet, MD [address] [place],  [place] [address] PCP: Velna Hatchet, MD   Assessment & Plan: Visit Diagnoses:  1. Bilateral primary osteoarthritis of knee     Plan: Second Euflexxa injection both knees.  Return next week to complete the series of 3  Follow-Up Instructions: Return in about 1 week (around 04/29/2020).   Orders:  Orders Placed This Encounter  Procedures  . Large Joint Inj: bilateral knee   No orders of the defined types were placed in this encounter.     Procedures: Large Joint Inj: bilateral knee on 04/22/2020 4:04 PM Indications: pain and joint swelling Details: 25 G 1.5 in needle, anteromedial approach  Arthrogram: No  Medications (Right): 20 mg Sodium Hyaluronate 20 MG/2ML Medications (Left): 20 mg Sodium Hyaluronate 20 MG/2ML Outcome: tolerated well, no immediate complications Procedure, treatment alternatives, risks and benefits explained, specific risks discussed. Consent was given by the patient. Immediately prior to procedure a time out was called to verify the correct patient, procedure, equipment, support staff and site/side marked as required. Patient was prepped and draped in the usual sterile fashion.       Clinical Data: No additional findings.   Subjective: Chief Complaint  Patient presents with  . Right Knee - Follow-up    Bilateral Euflexxa injections started 04/15/20  . Left Knee - Follow-up  Patient presents today for the second Euflexxa injections bilaterally. She started the series 04/15/2020.   HPI  Review of Systems   Objective: Vital Signs: There were no vitals taken for this visit.  Physical Exam  Ortho Exam knees were not hot red warm or swollen.  No effusion.  No significant pain  Specialty Comments:  No specialty comments  available.  Imaging: No results found.   PMFS History: Patient Active Problem List   Diagnosis Date Noted  . Chronic pain syndrome 05/04/2019  . Lumbar radiculopathy 12/18/2018  . Spinal stenosis of lumbar region with neurogenic claudication 12/18/2018  . Post laminectomy syndrome 12/18/2018  . Bilateral primary osteoarthritis of knee 12/27/2016  . 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 Prediabetes 12/03/2015  . OSA on CPAP 02/19/2015  . Morbid obesity ([place]) 10/09/2014  . Pulmonary HTN ([place]) 05/12/2013  . Bilateral lower extremity edema 05/12/2013  . Morbid obesity with BMI of 50.0-59.9, adult ([place])   . Back pain, hx of prior back surgery 12/09/2011  . Pancreatitis, mild, CT negative 12/08/2011  . Fatty liver 12/08/2011  . Substernal chest pain, suspected secondary to acute pancreatitis 12/06/2011  . Hypertension 12/06/2011  . Hypokalemia 12/06/2011  . Hypothyroidism 12/06/2011  . Anxiety 12/06/2011  . BV (bacterial vaginosis)   . Hyperlipidemia    Past Medical History:  Diagnosis Date  . Anemia when she was young  . Arthritis   . Congestion of nasal sinus   . Constipation    takes Colace every other day  . DDD (degenerative disc disease), lumbar   . Diabetes mellitus without complication ([place])    per pt had been diagnosed but then "number started going down so my doctor said i didnt have diabetes"  . Dry eyes    uses Systane Eye drops daily as needed  .  GERD (gastroesophageal reflux disease) 12/06/2011   takes Nexium daily  . Headache(784.0)    occasionally-d/t congestion  . Heart murmur   . History of bronchitis 6-7 yrs ago  . Hyperlipidemia   . Hypertension    takes Metoprolol and Diovan daily  . Hypothyroidism 12/06/2011   takes Synthroid daily  . Joint pain   . Multiple allergies    takes Zyrtec daily;uses Nasonex daily as needed  . Nocturia   . Numbness    weakness-right hand  . OSA on CPAP   . Pancreatitis   . Peripheral edema     takes Furosemide daily  . Shingles   . Urinary frequency   . Venous insufficiency of leg     Family History  Problem Relation Age of Onset  . Hypertension Mother   . Hypertension Father   . Hypertension Sister   . Kidney disease Sister        [facility] TRANSPLANT  . Thyroid disease Sister   . Thyroid disease Sister   . Hypertension Sister   . Breast cancer Maternal Aunt        >50; passed away from it  . Breast cancer Maternal Aunt        >50    Past Surgical History:  Procedure Laterality Date  . ABDOMINAL HYSTERECTOMY     uterine prolapse  . CARDIAC SURGERY    . CHOLECYSTECTOMY    . COLONOSCOPY    . ESOPHAGOGASTRODUODENOSCOPY  5-25yrs ago  . FOOT SURGERY Bilateral   . KNEE ARTHROSCOPY    . LAPAROSCOPIC GASTRIC SLEEVE RESECTION N/A 08/23/2016   Procedure: LAPAROSCOPIC GASTRIC SLEEVE RESECTION WITH UPPER ENDO;  Surgeon: Johnathan Hausen, MD;  [facility];  Service: General;  Laterality: N/A;  . POSTERIOR LUMBAR FUSION  03/10/2009   L3-4 and L3-4 fusion, Dr. Leeroy Cha  . SHOULDER ARTHROSCOPY WITH ROTATOR CUFF REPAIR AND SUBACROMIAL DECOMPRESSION Right 08/23/2013   Procedure: RIGHT SHOULDER ARTHROSCOPY WITH  SUBACROMIAL DECOMPRESSION DISTAL CLAVICLE RESECTION AND  ROTATOR CUFF REPAIR ;  Surgeon: Marin Shutter, MD;  Location: [facility];  Service: Orthopedics;  Laterality: Right;  . TUBAL LIGATION     Social History   Occupational History    Employer: [employer]    Comment: [employer]  Tobacco Use  . Smoking status: Former Smoker    Packs/day: 0.75    Years: 40.00    Pack years: 30.00    Types: Cigarettes    Quit date: 1993    Years since quitting: 29.2  . Smokeless tobacco: Never Used  . Tobacco comment: quit smoking in 1993  Vaping Use  . Vaping Use: Never used  Substance and Sexual Activity  . Alcohol use: Yes    Alcohol/week: 0.0 standard drinks    Comment: rarely;; mixed drink   . Drug use: No  . Sexual activity: Yes    Partners: Male    Birth  control/protection: Surgical    Comment: Hysterectomy

## 2020-04-29 ENCOUNTER — Ambulatory Visit (INDEPENDENT_AMBULATORY_CARE_PROVIDER_SITE_OTHER): Payer: 59 | Admitting: Family Medicine

## 2020-04-29 ENCOUNTER — Other Ambulatory Visit: Payer: Self-pay

## 2020-04-29 DIAGNOSIS — M1712 Unilateral primary osteoarthritis, left knee: Secondary | ICD-10-CM

## 2020-04-29 DIAGNOSIS — M1711 Unilateral primary osteoarthritis, right knee: Secondary | ICD-10-CM

## 2020-04-29 DIAGNOSIS — M170 Bilateral primary osteoarthritis of knee: Secondary | ICD-10-CM

## 2020-04-29 NOTE — Progress Notes (Signed)
Office Visit Note   Patient: [name]           Date of Birth: 1960/03/11           MRN: [id number] Visit Date: 04/29/2020 Requested by: Velna Hatchet, MD [address] [place],  [place] [address] PCP: Velna Hatchet, MD  Subjective: Chief Complaint  Patient presents with  . Right Knee - Follow-up    Bilateral knee Euflexxa injections #3 - [facility] patient  . Left Knee - Follow-up    HPI: He is here for Euflexxa No. 3 for bilateral knee DJD.  She is already noticing improvement.                ROS:   All other systems were reviewed and are negative.  Objective: Vital Signs: There were no vitals taken for this visit.  Physical Exam:  General:  Alert and oriented, in no acute distress. Pulm:  Breathing unlabored. Psy:  Normal mood, congruent affect. Skin: No erythema Knees: Trace effusion bilaterally.  Imaging: No results found.  Assessment & Plan: 1.  Bilateral knee osteoarthritis -Euflexxa No. 3 given today for each knee.  Follow-up as needed.     Procedures: Bilateral knee injections: After sterile prep with Betadine, injected 3 cc 0.25% bupivacaine and Euflexxa from lateral midpatellar approach, a flash of clear yellow synovial fluid was obtained prior to each injection.       PMFS History: Patient Active Problem List   Diagnosis Date Noted  . Chronic pain syndrome 05/04/2019  . Lumbar radiculopathy 12/18/2018  . Spinal stenosis of lumbar region with neurogenic claudication 12/18/2018  . Post laminectomy syndrome 12/18/2018  . Bilateral primary osteoarthritis of knee 12/27/2016  . 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 Prediabetes 12/03/2015  . OSA on CPAP 02/19/2015  . Morbid obesity ([place]) 10/09/2014  . Pulmonary HTN ([place]) 05/12/2013  . Bilateral lower extremity edema 05/12/2013  . Morbid obesity with BMI of 50.0-59.9, adult ([place])   . Back pain, hx of prior back surgery 12/09/2011  . Pancreatitis, mild, CT  negative 12/08/2011  . Fatty liver 12/08/2011  . Substernal chest pain, suspected secondary to acute pancreatitis 12/06/2011  . Hypertension 12/06/2011  . Hypokalemia 12/06/2011  . Hypothyroidism 12/06/2011  . Anxiety 12/06/2011  . BV (bacterial vaginosis)   . Hyperlipidemia    Past Medical History:  Diagnosis Date  . Anemia when she was young  . Arthritis   . Congestion of nasal sinus   . Constipation    takes Colace every other day  . DDD (degenerative disc disease), lumbar   . Diabetes mellitus without complication ([place])    per pt had been diagnosed but then "number started going down so my doctor said i didnt have diabetes"  . Dry eyes    uses Systane Eye drops daily as needed  . GERD (gastroesophageal reflux disease) 12/06/2011   takes Nexium daily  . Headache(784.0)    occasionally-d/t congestion  . Heart murmur   . History of bronchitis 6-7 yrs ago  . Hyperlipidemia   . Hypertension    takes Metoprolol and Diovan daily  . Hypothyroidism 12/06/2011   takes Synthroid daily  . Joint pain   . Multiple allergies    takes Zyrtec daily;uses Nasonex daily as needed  . Nocturia   . Numbness    weakness-right hand  . OSA on CPAP   . Pancreatitis   . Peripheral edema    takes Furosemide daily  . Shingles   .  Urinary frequency   . Venous insufficiency of leg     Family History  Problem Relation Age of Onset  . Hypertension Mother   . Hypertension Father   . Hypertension Sister   . Kidney disease Sister        [facility] TRANSPLANT  . Thyroid disease Sister   . Thyroid disease Sister   . Hypertension Sister   . Breast cancer Maternal Aunt        >50; passed away from it  . Breast cancer Maternal Aunt        >50    Past Surgical History:  Procedure Laterality Date  . ABDOMINAL HYSTERECTOMY     uterine prolapse  . CARDIAC SURGERY    . CHOLECYSTECTOMY    . COLONOSCOPY    . ESOPHAGOGASTRODUODENOSCOPY  5-69yrs ago  . FOOT SURGERY Bilateral   . KNEE ARTHROSCOPY     . LAPAROSCOPIC GASTRIC SLEEVE RESECTION N/A 08/23/2016   Procedure: LAPAROSCOPIC GASTRIC SLEEVE RESECTION WITH UPPER ENDO;  Surgeon: Johnathan Hausen, MD;  [facility];  Service: General;  Laterality: N/A;  . POSTERIOR LUMBAR FUSION  03/10/2009   L3-4 and L3-4 fusion, Dr. Leeroy Cha  . SHOULDER ARTHROSCOPY WITH ROTATOR CUFF REPAIR AND SUBACROMIAL DECOMPRESSION Right 08/23/2013   Procedure: RIGHT SHOULDER ARTHROSCOPY WITH  SUBACROMIAL DECOMPRESSION DISTAL CLAVICLE RESECTION AND  ROTATOR CUFF REPAIR ;  Surgeon: Marin Shutter, MD;  Location: [facility];  Service: Orthopedics;  Laterality: Right;  . TUBAL LIGATION     Social History   Occupational History    Employer:     Comment: [employer]  Tobacco Use  . Smoking status: Former Smoker    Packs/day: 0.75    Years: 40.00    Pack years: 30.00    Types: Cigarettes    Quit date: 1993    Years since quitting: 29.2  . Smokeless tobacco: Never Used  . Tobacco comment: quit smoking in 1993  Vaping Use  . Vaping Use: Never used  Substance and Sexual Activity  . Alcohol use: Yes    Alcohol/week: 0.0 standard drinks    Comment: rarely;; mixed drink   . Drug use: No  . Sexual activity: Yes    Partners: Male    Birth control/protection: Surgical    Comment: Hysterectomy

## 2020-04-30 ENCOUNTER — Other Ambulatory Visit: Payer: Self-pay | Admitting: Cardiovascular Disease

## 2020-04-30 MED FILL — Meloxicam Tab 15 MG: ORAL | 90 days supply | Qty: 90 | Fill #0 | Status: AC

## 2020-04-30 MED FILL — Esomeprazole Magnesium Cap Delayed Release 40 MG (Base Eq): ORAL | 90 days supply | Qty: 90 | Fill #0 | Status: AC

## 2020-04-30 MED FILL — Metoprolol Tartrate Tab 25 MG: ORAL | 90 days supply | Qty: 180 | Fill #0 | Status: AC

## 2020-04-30 MED FILL — Levothyroxine Sodium Tab 75 MCG: ORAL | 90 days supply | Qty: 90 | Fill #0 | Status: CN

## 2020-04-30 MED FILL — Methocarbamol Tab 500 MG: ORAL | 20 days supply | Qty: 60 | Fill #0 | Status: AC

## 2020-04-30 MED FILL — Baclofen Tab 10 MG: ORAL | 20 days supply | Qty: 60 | Fill #0 | Status: AC

## 2020-05-01 ENCOUNTER — Other Ambulatory Visit (HOSPITAL_COMMUNITY): Payer: Self-pay

## 2020-05-02 ENCOUNTER — Other Ambulatory Visit (HOSPITAL_COMMUNITY): Payer: Self-pay

## 2020-05-02 MED ORDER — LISINOPRIL 40 MG PO TABS
ORAL_TABLET | Freq: Every day | ORAL | 1 refills | Status: DC
  Filled 2020-05-02: qty 90, 90d supply, fill #0
  Filled 2020-08-04: qty 90, 90d supply, fill #1

## 2020-05-06 ENCOUNTER — Other Ambulatory Visit (HOSPITAL_COMMUNITY): Payer: Self-pay

## 2020-05-28 ENCOUNTER — Other Ambulatory Visit (HOSPITAL_COMMUNITY): Payer: Self-pay

## 2020-05-29 ENCOUNTER — Other Ambulatory Visit (HOSPITAL_COMMUNITY): Payer: Self-pay

## 2020-05-30 DIAGNOSIS — Z9884 Bariatric surgery status: Secondary | ICD-10-CM | POA: Diagnosis not present

## 2020-06-05 ENCOUNTER — Ambulatory Visit: Payer: 59 | Admitting: Orthopaedic Surgery

## 2020-06-06 ENCOUNTER — Ambulatory Visit: Payer: 59 | Admitting: Family Medicine

## 2020-06-06 ENCOUNTER — Other Ambulatory Visit (HOSPITAL_COMMUNITY): Payer: Self-pay

## 2020-06-06 ENCOUNTER — Other Ambulatory Visit: Payer: Self-pay

## 2020-06-06 ENCOUNTER — Encounter: Payer: Self-pay | Admitting: Family Medicine

## 2020-06-06 DIAGNOSIS — M170 Bilateral primary osteoarthritis of knee: Secondary | ICD-10-CM | POA: Diagnosis not present

## 2020-06-06 MED ORDER — HYDROCODONE-ACETAMINOPHEN 5-325 MG PO TABS
1.0000 | ORAL_TABLET | Freq: Four times a day (QID) | ORAL | 0 refills | Status: DC | PRN
  Filled 2020-06-06: qty 12, 3d supply, fill #0

## 2020-06-06 NOTE — Progress Notes (Signed)
Office Visit Note   Patient: [name]           Date of Birth: 16-Jul-1960           MRN: [id number] Visit Date: 06/06/2020 Requested by: Velna Hatchet, MD [address] [place],  [place] [address] PCP: Velna Hatchet, MD  Subjective: Chief Complaint  Patient presents with  . Right Knee - Pain    Requesting cortisone injections. Had the last of the series of Euflexxa injections (bil) on 04/29/20. A couple of weeks ago the pain has returned, mainly in the right knee.  . Left Knee - Pain    HPI: She is here with recurrent bilateral knee pain.  Gel injections helped about a week at most.  Right 1 hurts more than left, but she would like cortisone injections in both.  She has done better with those in the past then with gel injections.                ROS:   All other systems were reviewed and are negative.  Objective: Vital Signs: There were no vitals taken for this visit.  Physical Exam:  General:  Alert and oriented, in no acute distress. Pulm:  Breathing unlabored. Psy:  Normal mood, congruent affect. Skin: No erythema or warmth Knees: 1+ effusion bilaterally.  Full active extension, flexion of about 120 degrees.  Imaging: No results found.  Assessment & Plan: 1.  Bilateral knee osteoarthritis -Steroid injections given today.  Follow-up as needed.     Procedures: Bilateral knee injections: After sterile prep with Betadine, injected 3 cc 1% lidocaine without epinephrine and 40 mg Depo-Medrol from superolateral approach.       PMFS History: Patient Active Problem List   Diagnosis Date Noted  . Chronic pain syndrome 05/04/2019  . Lumbar radiculopathy 12/18/2018  . Spinal stenosis of lumbar region with neurogenic claudication 12/18/2018  . Post laminectomy syndrome 12/18/2018  . Bilateral primary osteoarthritis of knee 12/27/2016  . 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 Prediabetes 12/03/2015  . OSA on CPAP 02/19/2015  .  Morbid obesity ([place]) 10/09/2014  . Pulmonary HTN ([place]) 05/12/2013  . Bilateral lower extremity edema 05/12/2013  . Morbid obesity with BMI of 50.0-59.9, adult ([place])   . Back pain, hx of prior back surgery 12/09/2011  . Pancreatitis, mild, CT negative 12/08/2011  . Fatty liver 12/08/2011  . Substernal chest pain, suspected secondary to acute pancreatitis 12/06/2011  . Hypertension 12/06/2011  . Hypokalemia 12/06/2011  . Hypothyroidism 12/06/2011  . Anxiety 12/06/2011  . BV (bacterial vaginosis)   . Hyperlipidemia    Past Medical History:  Diagnosis Date  . Anemia when she was young  . Arthritis   . Congestion of nasal sinus   . Constipation    takes Colace every other day  . DDD (degenerative disc disease), lumbar   . Diabetes mellitus without complication ([place])    per pt had been diagnosed but then "number started going down so my doctor said i didnt have diabetes"  . Dry eyes    uses Systane Eye drops daily as needed  . GERD (gastroesophageal reflux disease) 12/06/2011   takes Nexium daily  . Headache(784.0)    occasionally-d/t congestion  . Heart murmur   . History of bronchitis 6-7 yrs ago  . Hyperlipidemia   . Hypertension    takes Metoprolol and Diovan daily  . Hypothyroidism 12/06/2011   takes Synthroid daily  . Joint pain   . Multiple  allergies    takes Zyrtec daily;uses Nasonex daily as needed  . Nocturia   . Numbness    weakness-right hand  . OSA on CPAP   . Pancreatitis   . Peripheral edema    takes Furosemide daily  . Shingles   . Urinary frequency   . Venous insufficiency of leg     Family History  Problem Relation Age of Onset  . Hypertension Mother   . Hypertension Father   . Hypertension Sister   . Kidney disease Sister        [facility] TRANSPLANT  . Thyroid disease Sister   . Thyroid disease Sister   . Hypertension Sister   . Breast cancer Maternal Aunt        >50; passed away from it  . Breast cancer Maternal Aunt        >50    Past  Surgical History:  Procedure Laterality Date  . ABDOMINAL HYSTERECTOMY     uterine prolapse  . CARDIAC SURGERY    . CHOLECYSTECTOMY    . COLONOSCOPY    . ESOPHAGOGASTRODUODENOSCOPY  5-84yrs ago  . FOOT SURGERY Bilateral   . KNEE ARTHROSCOPY    . LAPAROSCOPIC GASTRIC SLEEVE RESECTION N/A 08/23/2016   Procedure: LAPAROSCOPIC GASTRIC SLEEVE RESECTION WITH UPPER ENDO;  Surgeon: Johnathan Hausen, MD;  Location: [facility];  Service: General;  Laterality: N/A;  . POSTERIOR LUMBAR FUSION  03/10/2009   L3-4 and L3-4 fusion, Dr. Leeroy Cha  . SHOULDER ARTHROSCOPY WITH ROTATOR CUFF REPAIR AND SUBACROMIAL DECOMPRESSION Right 08/23/2013   Procedure: RIGHT SHOULDER ARTHROSCOPY WITH  SUBACROMIAL DECOMPRESSION DISTAL CLAVICLE RESECTION AND  ROTATOR CUFF REPAIR ;  Surgeon: Marin Shutter, MD;  Location: [facility];  Service: Orthopedics;  Laterality: Right;  . TUBAL LIGATION     Social History   Occupational History    Employer: [employer]    Comment: [employer]  Tobacco Use  . Smoking status: Former Smoker    Packs/day: 0.75    Years: 40.00    Pack years: 30.00    Types: Cigarettes    Quit date: 1993    Years since quitting: 29.3  . Smokeless tobacco: Never Used  . Tobacco comment: quit smoking in 1993  Vaping Use  . Vaping Use: Never used  Substance and Sexual Activity  . Alcohol use: Yes    Alcohol/week: 0.0 standard drinks    Comment: rarely;; mixed drink   . Drug use: No  . Sexual activity: Yes    Partners: Male    Birth control/protection: Surgical    Comment: Hysterectomy

## 2020-06-24 DIAGNOSIS — G4733 Obstructive sleep apnea (adult) (pediatric): Secondary | ICD-10-CM | POA: Diagnosis not present

## 2020-06-24 MED FILL — Methocarbamol Tab 500 MG: ORAL | 20 days supply | Qty: 60 | Fill #1 | Status: AC

## 2020-06-24 MED FILL — Levothyroxine Sodium Tab 75 MCG: ORAL | 90 days supply | Qty: 90 | Fill #0 | Status: AC

## 2020-06-25 ENCOUNTER — Other Ambulatory Visit (HOSPITAL_COMMUNITY): Payer: Self-pay

## 2020-06-26 ENCOUNTER — Other Ambulatory Visit (HOSPITAL_COMMUNITY): Payer: Self-pay

## 2020-07-08 ENCOUNTER — Other Ambulatory Visit (HOSPITAL_COMMUNITY): Payer: Self-pay

## 2020-07-08 DIAGNOSIS — R413 Other amnesia: Secondary | ICD-10-CM | POA: Diagnosis not present

## 2020-07-08 DIAGNOSIS — R519 Headache, unspecified: Secondary | ICD-10-CM | POA: Diagnosis not present

## 2020-07-08 DIAGNOSIS — E1169 Type 2 diabetes mellitus with other specified complication: Secondary | ICD-10-CM | POA: Diagnosis not present

## 2020-07-08 DIAGNOSIS — J0110 Acute frontal sinusitis, unspecified: Secondary | ICD-10-CM | POA: Diagnosis not present

## 2020-07-08 DIAGNOSIS — I10 Essential (primary) hypertension: Secondary | ICD-10-CM | POA: Diagnosis not present

## 2020-07-08 MED ORDER — DOXYCYCLINE HYCLATE 100 MG PO CAPS
ORAL_CAPSULE | ORAL | 0 refills | Status: DC
  Filled 2020-07-08: qty 14, 7d supply, fill #0

## 2020-07-09 ENCOUNTER — Ambulatory Visit: Payer: 59 | Admitting: Obstetrics and Gynecology

## 2020-07-30 ENCOUNTER — Ambulatory Visit (INDEPENDENT_AMBULATORY_CARE_PROVIDER_SITE_OTHER): Payer: 59 | Admitting: Orthopaedic Surgery

## 2020-07-30 ENCOUNTER — Encounter: Payer: Self-pay | Admitting: Orthopaedic Surgery

## 2020-07-30 ENCOUNTER — Other Ambulatory Visit: Payer: Self-pay

## 2020-07-30 VITALS — Ht 63.0 in | Wt 259.0 lb

## 2020-07-30 DIAGNOSIS — M170 Bilateral primary osteoarthritis of knee: Secondary | ICD-10-CM

## 2020-07-30 DIAGNOSIS — M1711 Unilateral primary osteoarthritis, right knee: Secondary | ICD-10-CM | POA: Diagnosis not present

## 2020-07-30 MED ORDER — LIDOCAINE HCL 1 % IJ SOLN
2.0000 mL | INTRAMUSCULAR | Status: AC | PRN
  Administered 2020-07-30: 2 mL

## 2020-07-30 MED ORDER — BUPIVACAINE HCL 0.25 % IJ SOLN
2.0000 mL | INTRAMUSCULAR | Status: AC | PRN
  Administered 2020-07-30: 2 mL via INTRA_ARTICULAR

## 2020-07-30 MED ORDER — METHYLPREDNISOLONE ACETATE 40 MG/ML IJ SUSP
80.0000 mg | INTRAMUSCULAR | Status: AC | PRN
  Administered 2020-07-30: 80 mg via INTRA_ARTICULAR

## 2020-07-30 NOTE — Progress Notes (Signed)
Office Visit Note   Patient: [name]           Date of Birth: 04/29/1960           MRN: [id number] Visit Date: 07/30/2020              Requested by: Velna Hatchet, MD [address] [place],  [place] [address] PCP: Velna Hatchet, MD   Assessment & Plan: Visit Diagnoses:  1. Bilateral primary osteoarthritis of knee     Plan: Recurrent symptoms of osteoarthritis right knee.  Will reinject with Depo-Medrol and monitor response  Follow-Up Instructions: Return if symptoms worsen or fail to improve.   Orders:  Orders Placed This Encounter  Procedures   Large Joint Inj: R knee   No orders of the defined types were placed in this encounter.     Procedures: Large Joint Inj: R knee on 07/30/2020 3:53 PM Indications: pain and diagnostic evaluation Details: 25 G 1.5 in needle, anteromedial approach  Arthrogram: No  Medications: 2 mL lidocaine 1 %; 80 mg methylPREDNISolone acetate 40 MG/ML; 2 mL bupivacaine 0.25 % Procedure, treatment alternatives, risks and benefits explained, specific risks discussed. Consent was given by the patient. Immediately prior to procedure a time out was called to verify the correct patient, procedure, equipment, support staff and site/side marked as required. Patient was prepped and draped in the usual sterile fashion.      Clinical Data: No additional findings.   Subjective: Chief Complaint  Patient presents with   Right Knee - Pain  Patient presents today for recurrent right knee pain. She was here in May and had both her knees injected with Dr.Hilts. She states that the injection helped, but the pain returned a week ago. She was given hydrocodone by Dr.Hilts and does take those as needed.   HPI  Review of Systems   Objective: Vital Signs: Ht 5\' 3"  (1.6 m)   Wt 259 lb (117.5 kg)   BMI 45.88 kg/m   Physical Exam Constitutional:      Appearance: She is well-developed.  Eyes:     Pupils: Pupils are equal, round, and  reactive to light.  Pulmonary:     Effort: Pulmonary effort is normal.  Skin:    General: Skin is warm and dry.  Neurological:     Mental Status: She is alert and oriented to person, place, and time.  Psychiatric:        Behavior: Behavior normal.    Ortho Exam large knees BMI 46.  No obvious effusion.  Mostly medial joint pain right knee.  Full extension flexed about 90 degrees 1 thigh with touch calf.  No instability.  Some patella crepitation but no pain with compression  Specialty Comments:  No specialty comments available.  Imaging: No results found.   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 08/23/2016   Prediabetes 12/03/2015   OSA on CPAP 02/19/2015   Morbid obesity ([place]) 10/09/2014   Pulmonary HTN ([place]) 05/12/2013   Bilateral lower extremity edema 05/12/2013   Morbid obesity with BMI of 50.0-59.9, adult ([place])    Back pain, hx of prior back surgery 12/09/2011   Pancreatitis, mild, CT negative 12/08/2011   Fatty liver 12/08/2011  Substernal chest pain, suspected secondary to acute pancreatitis 12/06/2011   Hypertension 12/06/2011   Hypokalemia 12/06/2011   Hypothyroidism 12/06/2011   Anxiety 12/06/2011   BV (bacterial vaginosis)    Hyperlipidemia    Past Medical History:  Diagnosis Date   Anemia when she was young   Arthritis    Congestion of nasal sinus    Constipation    takes Colace every other day   DDD (degenerative disc disease), lumbar    Diabetes mellitus without complication ([facility])    per pt had been diagnosed but then "number started going down so my doctor said i didnt have diabetes"   Dry eyes    uses Systane Eye drops daily as needed    GERD (gastroesophageal reflux disease) 12/06/2011   takes Nexium daily   Headache(784.0)    occasionally-d/t congestion   Heart murmur    History of bronchitis 6-7 yrs ago   Hyperlipidemia    Hypertension    takes Metoprolol and Diovan daily   Hypothyroidism 12/06/2011   takes Synthroid daily   Joint pain    Multiple allergies    takes Zyrtec daily;uses Nasonex daily as needed   Nocturia    Numbness    weakness-right hand   OSA on CPAP    Pancreatitis    Peripheral edema    takes Furosemide daily   Shingles    Urinary frequency    Venous insufficiency of leg     Family History  Problem Relation Age of Onset   Hypertension Mother    Hypertension Father    Hypertension Sister    Kidney disease Sister        KIDNEL TRANSPLANT   Thyroid disease Sister    Thyroid disease Sister    Hypertension Sister    Breast cancer Maternal Aunt        >50; passed away from it   Breast cancer Maternal Aunt        >50    Past Surgical History:  Procedure Laterality Date   ABDOMINAL HYSTERECTOMY     uterine prolapse   CARDIAC SURGERY     CHOLECYSTECTOMY     COLONOSCOPY     ESOPHAGOGASTRODUODENOSCOPY  5-97yrs ago   FOOT SURGERY Bilateral    KNEE ARTHROSCOPY     LAPAROSCOPIC GASTRIC SLEEVE RESECTION N/A 08/23/2016   Procedure: LAPAROSCOPIC GASTRIC SLEEVE RESECTION WITH UPPER ENDO;  Surgeon: Johnathan Hausen, MD;  [facility];  Service: General;  Laterality: N/A;   POSTERIOR LUMBAR FUSION  03/10/2009   L3-4 and L3-4 fusion, Dr. Leeroy Cha   SHOULDER ARTHROSCOPY WITH ROTATOR CUFF REPAIR AND SUBACROMIAL DECOMPRESSION Right 08/23/2013   Procedure: RIGHT SHOULDER ARTHROSCOPY WITH  SUBACROMIAL DECOMPRESSION DISTAL CLAVICLE RESECTION AND  ROTATOR CUFF REPAIR ;  Surgeon: Marin Shutter, MD;  Location: [facility];  Service: Orthopedics;  Laterality: Right;   TUBAL LIGATION     Social History   Occupational History    Employer: [employer]    Comment: [employer]  Tobacco Use   Smoking status:  Former    Packs/day: 0.75    Years: 40.00    Pack years: 30.00    Types: Cigarettes    Quit date: 1993    Years since quitting: 29.5   Smokeless tobacco: Never   Tobacco comments:    quit smoking in 1993  Vaping Use   Vaping Use: Never used  Substance and Sexual Activity   Alcohol use: Yes    Alcohol/week: 0.0 standard drinks  Comment: rarely;; mixed drink    Drug use: No   Sexual activity: Yes    Partners: Male    Birth control/protection: Surgical    Comment: Hysterectomy

## 2020-08-04 MED FILL — Meloxicam Tab 15 MG: ORAL | 90 days supply | Qty: 90 | Fill #1 | Status: AC

## 2020-08-04 MED FILL — Gabapentin Cap 300 MG: ORAL | 90 days supply | Qty: 270 | Fill #0 | Status: AC

## 2020-08-04 MED FILL — Metoprolol Tartrate Tab 25 MG: ORAL | 90 days supply | Qty: 180 | Fill #1 | Status: AC

## 2020-08-05 ENCOUNTER — Other Ambulatory Visit (HOSPITAL_COMMUNITY): Payer: Self-pay

## 2020-08-06 ENCOUNTER — Other Ambulatory Visit: Payer: Self-pay | Admitting: Obstetrics and Gynecology

## 2020-08-06 ENCOUNTER — Other Ambulatory Visit: Payer: Self-pay | Admitting: *Deleted

## 2020-08-06 DIAGNOSIS — I10 Essential (primary) hypertension: Secondary | ICD-10-CM

## 2020-08-06 DIAGNOSIS — E039 Hypothyroidism, unspecified: Secondary | ICD-10-CM

## 2020-08-06 DIAGNOSIS — N63 Unspecified lump in unspecified breast: Secondary | ICD-10-CM

## 2020-08-07 ENCOUNTER — Other Ambulatory Visit: Payer: Self-pay | Admitting: Obstetrics and Gynecology

## 2020-08-07 ENCOUNTER — Other Ambulatory Visit: Payer: Self-pay

## 2020-08-07 ENCOUNTER — Ambulatory Visit
Admission: RE | Admit: 2020-08-07 | Discharge: 2020-08-07 | Disposition: A | Discharge status: Home / Self Care | Payer: 59 | Source: Ambulatory Visit | Attending: Internal Medicine | Admitting: Internal Medicine

## 2020-08-07 DIAGNOSIS — N63 Unspecified lump in unspecified breast: Secondary | ICD-10-CM

## 2020-08-07 DIAGNOSIS — R928 Other abnormal and inconclusive findings on diagnostic imaging of breast: Secondary | ICD-10-CM | POA: Diagnosis not present

## 2020-08-07 DIAGNOSIS — E039 Hypothyroidism, unspecified: Secondary | ICD-10-CM | POA: Diagnosis not present

## 2020-08-07 DIAGNOSIS — I10 Essential (primary) hypertension: Secondary | ICD-10-CM | POA: Diagnosis not present

## 2020-08-07 DIAGNOSIS — N6322 Unspecified lump in the left breast, upper inner quadrant: Secondary | ICD-10-CM | POA: Diagnosis not present

## 2020-08-07 LAB — COMPREHENSIVE METABOLIC PANEL
ALT: 14 IU/L (ref 0–32)
AST: 19 IU/L (ref 0–40)
Albumin/Globulin Ratio: 1.3 (ref 1.2–2.2)
Albumin: 4.3 g/dL (ref 3.8–4.9)
Alkaline Phosphatase: 153 IU/L — ABNORMAL HIGH (ref 44–121)
BUN/Creatinine Ratio: 21 (ref 12–28)
BUN: 17 mg/dL (ref 8–27)
Bilirubin Total: 0.2 mg/dL (ref 0.0–1.2)
CO2: 25 mmol/L (ref 20–29)
Calcium: 9.5 mg/dL (ref 8.7–10.3)
Chloride: 102 mmol/L (ref 96–106)
Creatinine, Ser: 0.81 mg/dL (ref 0.57–1.00)
Globulin, Total: 3.2 g/dL (ref 1.5–4.5)
Glucose: 91 mg/dL (ref 65–99)
Potassium: 4.5 mmol/L (ref 3.5–5.2)
Sodium: 141 mmol/L (ref 134–144)
Total Protein: 7.5 g/dL (ref 6.0–8.5)
eGFR: 83 mL/min/{1.73_m2} (ref >59)

## 2020-08-07 LAB — CBC
Hematocrit: 41 % (ref 34.0–46.6)
Hemoglobin: 13.1 g/dL (ref 11.1–15.9)
MCH: 26.8 pg (ref 26.6–33.0)
MCHC: 32 g/dL (ref 31.5–35.7)
MCV: 84 fL (ref 79–97)
Platelets: 344 10*3/uL (ref 150–450)
RBC: 4.89 x10E6/uL (ref 3.77–5.28)
RDW: 13.5 % (ref 11.7–15.4)
WBC: 6.9 10*3/uL (ref 3.4–10.8)

## 2020-08-07 LAB — LIPID PANEL
Chol/HDL Ratio: 2.9 ratio (ref 0.0–4.4)
Cholesterol, Total: 214 mg/dL — ABNORMAL HIGH (ref 100–199)
HDL: 74 mg/dL (ref >39)
LDL Chol Calc (NIH): 125 mg/dL — ABNORMAL HIGH (ref 0–99)
Triglycerides: 88 mg/dL (ref 0–149)
VLDL Cholesterol Cal: 15 mg/dL (ref 5–40)

## 2020-08-07 LAB — TSH: TSH: 1.1 u[IU]/mL (ref 0.450–4.500)

## 2020-08-08 LAB — URINALYSIS
Bilirubin, UA: NEGATIVE
Glucose, UA: NEGATIVE
Ketones, UA: NEGATIVE
Leukocytes,UA: NEGATIVE
Nitrite, UA: NEGATIVE
Protein,UA: NEGATIVE
RBC, UA: NEGATIVE
Specific Gravity, UA: 1.019 (ref 1.005–1.030)
Urobilinogen, Ur: 0.2 mg/dL (ref 0.2–1.0)
pH, UA: 6 (ref 5.0–7.5)

## 2020-08-08 LAB — HEMOGLOBIN A1C
Est. average glucose Bld gHb Est-mCnc: 126 mg/dL
Hgb A1c MFr Bld: 6 % — ABNORMAL HIGH (ref 4.8–5.6)

## 2020-08-08 LAB — MICROALBUMIN / CREATININE URINE RATIO
Creatinine, Urine: 122.8 mg/dL
Microalb/Creat Ratio: 3 mg/g creat (ref 0–29)
Microalbumin, Urine: 3.5 ug/mL

## 2020-08-13 ENCOUNTER — Other Ambulatory Visit (HOSPITAL_COMMUNITY): Payer: Self-pay

## 2020-08-13 DIAGNOSIS — J0110 Acute frontal sinusitis, unspecified: Secondary | ICD-10-CM | POA: Diagnosis not present

## 2020-08-13 DIAGNOSIS — I10 Essential (primary) hypertension: Secondary | ICD-10-CM | POA: Diagnosis not present

## 2020-08-13 DIAGNOSIS — Z1331 Encounter for screening for depression: Secondary | ICD-10-CM | POA: Diagnosis not present

## 2020-08-13 DIAGNOSIS — E785 Hyperlipidemia, unspecified: Secondary | ICD-10-CM | POA: Diagnosis not present

## 2020-08-13 DIAGNOSIS — R42 Dizziness and giddiness: Secondary | ICD-10-CM | POA: Diagnosis not present

## 2020-08-13 DIAGNOSIS — Z1339 Encounter for screening examination for other mental health and behavioral disorders: Secondary | ICD-10-CM | POA: Diagnosis not present

## 2020-08-13 DIAGNOSIS — Z0000 Encounter for general adult medical examination without abnormal findings: Secondary | ICD-10-CM | POA: Diagnosis not present

## 2020-08-13 DIAGNOSIS — R059 Cough, unspecified: Secondary | ICD-10-CM | POA: Diagnosis not present

## 2020-08-13 DIAGNOSIS — T7840XA Allergy, unspecified, initial encounter: Secondary | ICD-10-CM | POA: Diagnosis not present

## 2020-08-13 DIAGNOSIS — E1169 Type 2 diabetes mellitus with other specified complication: Secondary | ICD-10-CM | POA: Diagnosis not present

## 2020-08-13 LAB — VITAMIN D 1,25 DIHYDROXY
Vitamin D 1, 25 (OH)2 Total: 53 pg/mL
Vitamin D2 1, 25 (OH)2: <10 pg/mL
Vitamin D3 1, 25 (OH)2: 53 pg/mL

## 2020-08-13 MED ORDER — FLUTICASONE PROPIONATE 50 MCG/ACT NA SUSP
NASAL | 4 refills | Status: DC
  Filled 2020-08-13: qty 16, 60d supply, fill #0
  Filled 2021-03-30: qty 16, 60d supply, fill #1

## 2020-08-13 MED ORDER — OLMESARTAN MEDOXOMIL 20 MG PO TABS
20.0000 mg | ORAL_TABLET | Freq: Every day | ORAL | 3 refills | Status: DC
  Filled 2020-08-13: qty 90, 90d supply, fill #0
  Filled 2020-10-22: qty 90, 90d supply, fill #1

## 2020-08-15 ENCOUNTER — Other Ambulatory Visit (HOSPITAL_COMMUNITY): Payer: Self-pay

## 2020-08-18 ENCOUNTER — Other Ambulatory Visit: Payer: Self-pay | Admitting: Physical Medicine and Rehabilitation

## 2020-08-18 MED FILL — Esomeprazole Magnesium Cap Delayed Release 40 MG (Base Eq): ORAL | 90 days supply | Qty: 90 | Fill #1 | Status: AC

## 2020-08-19 ENCOUNTER — Other Ambulatory Visit (HOSPITAL_COMMUNITY): Payer: Self-pay

## 2020-08-19 DIAGNOSIS — Z1212 Encounter for screening for malignant neoplasm of rectum: Secondary | ICD-10-CM | POA: Diagnosis not present

## 2020-08-19 MED ORDER — BACLOFEN 10 MG PO TABS
ORAL_TABLET | ORAL | 3 refills | Status: DC
  Filled 2020-08-19: qty 60, 20d supply, fill #0
  Filled 2020-09-11: qty 60, 20d supply, fill #1
  Filled 2020-12-18 – 2021-02-03 (×2): qty 60, 20d supply, fill #2
  Filled 2021-02-28: qty 60, 20d supply, fill #3

## 2020-08-19 NOTE — Telephone Encounter (Signed)
Please advise 

## 2020-08-26 ENCOUNTER — Other Ambulatory Visit (HOSPITAL_BASED_OUTPATIENT_CLINIC_OR_DEPARTMENT_OTHER): Payer: Self-pay

## 2020-09-11 ENCOUNTER — Other Ambulatory Visit: Payer: Self-pay | Admitting: Family Medicine

## 2020-09-11 ENCOUNTER — Other Ambulatory Visit: Payer: Self-pay | Admitting: Physical Medicine and Rehabilitation

## 2020-09-11 ENCOUNTER — Other Ambulatory Visit (HOSPITAL_COMMUNITY): Payer: Self-pay

## 2020-09-11 MED ORDER — HYDROCODONE-ACETAMINOPHEN 5-325 MG PO TABS
1.0000 | ORAL_TABLET | Freq: Four times a day (QID) | ORAL | 0 refills | Status: DC | PRN
  Filled 2020-09-11: qty 12, 3d supply, fill #0

## 2020-09-11 MED ORDER — METHOCARBAMOL 500 MG PO TABS
ORAL_TABLET | Freq: Three times a day (TID) | ORAL | 3 refills | Status: DC | PRN
  Filled 2020-09-11: qty 60, 20d supply, fill #0
  Filled 2020-11-05: qty 60, 20d supply, fill #1
  Filled 2021-01-01: qty 60, 20d supply, fill #2
  Filled 2021-02-28: qty 60, 20d supply, fill #3

## 2020-09-11 NOTE — Telephone Encounter (Signed)
Please advise 

## 2020-09-16 ENCOUNTER — Ambulatory Visit: Payer: 59 | Admitting: Obstetrics and Gynecology

## 2020-09-16 DIAGNOSIS — G4733 Obstructive sleep apnea (adult) (pediatric): Secondary | ICD-10-CM | POA: Diagnosis not present

## 2020-10-01 ENCOUNTER — Other Ambulatory Visit (HOSPITAL_COMMUNITY): Payer: Self-pay

## 2020-10-01 MED FILL — Levothyroxine Sodium Tab 75 MCG: ORAL | 90 days supply | Qty: 90 | Fill #1 | Status: AC

## 2020-10-22 MED FILL — Metoprolol Tartrate Tab 25 MG: ORAL | 90 days supply | Qty: 180 | Fill #2 | Status: AC

## 2020-10-22 MED FILL — Meloxicam Tab 15 MG: ORAL | 90 days supply | Qty: 90 | Fill #2 | Status: AC

## 2020-10-23 ENCOUNTER — Other Ambulatory Visit (HOSPITAL_COMMUNITY): Payer: Self-pay

## 2020-10-24 ENCOUNTER — Other Ambulatory Visit (HOSPITAL_COMMUNITY): Payer: Self-pay

## 2020-10-29 ENCOUNTER — Other Ambulatory Visit (HOSPITAL_COMMUNITY): Payer: Self-pay

## 2020-11-05 ENCOUNTER — Other Ambulatory Visit: Payer: Self-pay | Admitting: Orthopaedic Surgery

## 2020-11-05 MED FILL — Esomeprazole Magnesium Cap Delayed Release 40 MG (Base Eq): ORAL | 90 days supply | Qty: 90 | Fill #2 | Status: AC

## 2020-11-06 ENCOUNTER — Other Ambulatory Visit (HOSPITAL_COMMUNITY): Payer: Self-pay

## 2020-11-06 MED ORDER — GABAPENTIN 300 MG PO CAPS
ORAL_CAPSULE | Freq: Three times a day (TID) | ORAL | 1 refills | Status: DC
  Filled 2020-11-06: qty 270, 90d supply, fill #0
  Filled 2021-02-02: qty 270, 90d supply, fill #1

## 2020-11-06 NOTE — Telephone Encounter (Signed)
Needs to check with family MD for gabapentin refills

## 2020-11-11 ENCOUNTER — Ambulatory Visit: Payer: 59 | Admitting: Orthopaedic Surgery

## 2020-11-11 ENCOUNTER — Encounter: Payer: Self-pay | Admitting: Orthopaedic Surgery

## 2020-11-11 ENCOUNTER — Other Ambulatory Visit: Payer: Self-pay

## 2020-11-11 DIAGNOSIS — M170 Bilateral primary osteoarthritis of knee: Secondary | ICD-10-CM | POA: Diagnosis not present

## 2020-11-11 MED ORDER — LIDOCAINE HCL 1 % IJ SOLN
2.0000 mL | INTRAMUSCULAR | Status: AC | PRN
  Administered 2020-11-11: 2 mL

## 2020-11-11 MED ORDER — METHYLPREDNISOLONE ACETATE 40 MG/ML IJ SUSP
80.0000 mg | INTRAMUSCULAR | Status: AC | PRN
Start: 2020-11-11 — End: 2020-11-11
  Administered 2020-11-11: 80 mg via INTRA_ARTICULAR

## 2020-11-11 MED ORDER — METHYLPREDNISOLONE ACETATE 40 MG/ML IJ SUSP
80.0000 mg | INTRAMUSCULAR | Status: AC | PRN
  Administered 2020-11-11: 80 mg via INTRA_ARTICULAR

## 2020-11-11 NOTE — Progress Notes (Signed)
Office Visit Note   Patient: [name]           Date of Birth: [birth year]           MRN: [id number] Visit Date: 11/11/2020              Requested by: Velna Hatchet, MD [address] [place],  [place] [address] PCP: Velna Hatchet, MD   Assessment & Plan: Visit Diagnoses: No diagnosis found.  Plan: Patient has a history of bilateral knee osteoarthritis.  She is done very well with steroid injections and states that she got longer relief this time.  She was injected without any difficulty.  We will follow-up as needed.  Follow-Up Instructions: No follow-ups on file.   Orders:  No orders of the defined types were placed in this encounter.  No orders of the defined types were placed in this encounter.     Procedures: Large Joint Inj: bilateral knee on 11/11/2020 3:29 PM Details: 25 G 1.5 in needle, anteromedial approach  Arthrogram: No  Medications (Right): 2 mL lidocaine 1 %; 80 mg methylPREDNISolone acetate 40 MG/ML Medications (Left): 2 mL lidocaine 1 %; 80 mg methylPREDNISolone acetate 40 MG/ML  After obtaining verbal consent and sterilely prepping her bilateral anterior medial aspect of her knees with Betadine and alcohol she was injected with a 25-gauge needle.  She tolerated the procedure well Band-Aids were applied     Clinical Data: No additional findings.   Subjective: Chief Complaint  Patient presents with   Right Knee - Pain   Left Knee - Pain  Patient presents today for follow up on her recurrent chronic bilateral knee pain. She had her right knee injected with cortisone in July. She is here today wanting to have both knees injected with cortisone.  HPI patient is a pleasant 60 year old woman with a history of bilateral knee pain.  She has had steroid injections in the past and done quite well requesting that today.  She had no problems with previous injections  Review of Systems  All other systems reviewed and are  negative.   Objective: Vital Signs: There were no vitals taken for this visit.  Physical Exam Neurological:     General: No focal deficit present.     Mental Status: She is alert.  Psychiatric:        Mood and Affect: Mood normal.    Ortho Exam Examination bilateral knees no effusion no redness no signs of cellulitis.  She has painful range of motion of both knees with crepitus. Specialty Comments:  No specialty comments available.  Imaging: No results found.   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 08/23/2016   Prediabetes 12/03/2015   OSA on CPAP 02/19/2015   Morbid obesity ([place]) 10/09/2014   Pulmonary HTN ([place]) 05/12/2013   Bilateral lower extremity edema 05/12/2013   Morbid obesity with BMI of 50.0-59.9, adult ([place])    Back pain, hx of prior back surgery 12/09/2011   Pancreatitis, mild, CT negative 12/08/2011   Fatty liver 12/08/2011   Substernal chest pain, suspected secondary to acute pancreatitis 12/06/2011   Hypertension 12/06/2011   Hypokalemia 12/06/2011   Hypothyroidism 12/06/2011   Anxiety 12/06/2011   BV (bacterial vaginosis)  Hyperlipidemia    Past Medical History:  Diagnosis Date   Anemia when she was young   Arthritis    Congestion of nasal sinus    Constipation    takes Colace every other day   DDD (degenerative disc disease), lumbar    Diabetes mellitus without complication ([place])    per pt had been diagnosed but then "number started going down so my doctor said i didnt have diabetes"   Dry eyes    uses Systane Eye drops daily as needed   GERD (gastroesophageal reflux disease) 12/06/2011   takes Nexium daily   Headache(784.0)     occasionally-d/t congestion   Heart murmur    History of bronchitis 6-7 yrs ago   Hyperlipidemia    Hypertension    takes Metoprolol and Diovan daily   Hypothyroidism 12/06/2011   takes Synthroid daily   Joint pain    Multiple allergies    takes Zyrtec daily;uses Nasonex daily as needed   Nocturia    Numbness    weakness-right hand   OSA on CPAP    Pancreatitis    Peripheral edema    takes Furosemide daily   Shingles    Urinary frequency    Venous insufficiency of leg     Family History  Problem Relation Age of Onset   Hypertension Mother    Hypertension Father    Hypertension Sister    Kidney disease Sister        KIDNEL TRANSPLANT   Thyroid disease Sister    Thyroid disease Sister    Hypertension Sister    Breast cancer Maternal Aunt        >50; passed away from it   Breast cancer Maternal Aunt        >50    Past Surgical History:  Procedure Laterality Date   ABDOMINAL HYSTERECTOMY     uterine prolapse   CARDIAC SURGERY     CHOLECYSTECTOMY     COLONOSCOPY     ESOPHAGOGASTRODUODENOSCOPY  5-68yrs ago   FOOT SURGERY Bilateral    KNEE ARTHROSCOPY     LAPAROSCOPIC GASTRIC SLEEVE RESECTION N/A 08/23/2016   Procedure: LAPAROSCOPIC GASTRIC SLEEVE RESECTION WITH UPPER ENDO;  Surgeon: Johnathan Hausen, MD;  [facility];  Service: General;  Laterality: N/A;   POSTERIOR LUMBAR FUSION  03/10/2009   L3-4 and L3-4 fusion, Dr. Leeroy Cha   SHOULDER ARTHROSCOPY WITH ROTATOR CUFF REPAIR AND SUBACROMIAL DECOMPRESSION Right 08/23/2013   Procedure: RIGHT SHOULDER ARTHROSCOPY WITH  SUBACROMIAL DECOMPRESSION DISTAL CLAVICLE RESECTION AND  ROTATOR CUFF REPAIR ;  Surgeon: Marin Shutter, MD;  Location: [facility];  Service: Orthopedics;  Laterality: Right;   TUBAL LIGATION     Social History   Occupational History    Employer: [employer]    Comment: [employer]  Tobacco Use   Smoking status: Former    Packs/day: 0.75    Years: 40.00    Pack years: 30.00    Types: Cigarettes     Quit date: 1993    Years since quitting: 29.8   Smokeless tobacco: Never   Tobacco comments:    quit smoking in 1993  Vaping Use   Vaping Use: Never used  Substance and Sexual Activity   Alcohol use: Yes    Alcohol/week: 0.0 standard drinks    Comment: rarely;; mixed drink    Drug use: No   Sexual activity: Yes    Partners: Male    Birth control/protection: Surgical    Comment: Hysterectomy

## 2020-11-19 ENCOUNTER — Other Ambulatory Visit (HOSPITAL_COMMUNITY): Payer: Self-pay

## 2020-11-19 MED FILL — Azelastine HCl Nasal Spray 0.1% (137 MCG/SPRAY): NASAL | 30 days supply | Qty: 30 | Fill #0 | Status: AC

## 2020-11-19 MED FILL — Fluticasone Propionate Nasal Susp 50 MCG/ACT: NASAL | 30 days supply | Qty: 16 | Fill #0 | Status: AC

## 2020-11-20 ENCOUNTER — Other Ambulatory Visit (HOSPITAL_COMMUNITY): Payer: Self-pay

## 2020-11-25 ENCOUNTER — Telehealth: Payer: Self-pay | Admitting: Family Medicine

## 2020-11-25 ENCOUNTER — Other Ambulatory Visit (HOSPITAL_COMMUNITY): Payer: Self-pay

## 2020-11-25 MED ORDER — DIAZEPAM 5 MG PO TABS
ORAL_TABLET | ORAL | 1 refills | Status: DC
  Filled 2020-11-25: qty 30, 30d supply, fill #0
  Filled 2021-04-10: qty 30, 30d supply, fill #1

## 2020-11-25 NOTE — Telephone Encounter (Signed)
Pt called, pressure for CPAP machine causing nose to stink and lot of nasal infections. Would like a call from the nurse to discuss if pressure can be decreased.

## 2020-11-25 NOTE — Telephone Encounter (Signed)
Tried calling pt back, VM not set up. Pt has not been seen since 2020. Has appt 12/25/20.  Is she making sure to clean machine regularly? Has she reached out to DME company about this to see if they can help?

## 2020-11-26 NOTE — Telephone Encounter (Signed)
Called pt. She would like to try different mask/see if pressure needs to be changed. Feels this is the issue. Did advise she would need to come in for appt first to be evaluated. She will bring machine to appt. Scheduled earlier f/u for 12/01/20 at 8am w/ AL,NP. Asked she check in by 745am. Cx 12/25/20 appt since she will be coming in sooner.

## 2020-11-27 NOTE — Progress Notes (Signed)
Chief Complaint  Patient presents with   Obstructive Sleep Apnea    Rm 1, alone. Here for cpap f/u. Pt reports her pressure is to high, has been having a lot of sinus infection and sneezing. Would like to try a different mask. Has been getting her supplies from [facility].      HISTORY OF PRESENT ILLNESS:  12/01/20 ALL:  [name] is a 60 y.o. female here today for follow up for OSA on CPAP. She was last seen 09/2018. She reports continued use of CPAP. She feels that pressure may be too strong for her. She was using a friend's unused nasal mask but not sure it is the correct size. She has noted large air leak. She reports recurrent sneezing and sinus infections. She reports cleaning mask every 3 days and tubing every other week. She is eligible for a new CPAP. Set up 12/2014.      HISTORY (copied from previous note)  10/19/18 [name] is a 60 y.o. female here today for follow up for OSA on CPAP.  He reports that she is doing very well on CPAP therapy.  She recently did switch to a nasal pillow.  She feels that this is much better for her.  She has not noted a leak since changing her mask.  She continues to work on weight loss.  She has lost about 50 pounds in the past 2 years.   Compliance report dated 09/18/2018 through 10/17/2018 reveals that she is using CPAP every night for compliance 100%.  29 of the last 30 days she used CPAP greater than 4 hours for compliance of 97%.  Average usage was 7 hours and 51 minutes.  AHI was 0.6 on 9 cm of water and an EPR of 3.  There was a leak noted in the 95th percentile of 37.0.   HISTORY: (copied from Dr Guadelupe Sabin note on 10/06/2017)   [name] is a 60 year old right-handed woman with an underlying medical history of hypertension, hyperlipidemia, diabetes, arthritis, hypothyroidism, reflux disease, history of pancreatitis, deemed secondary to medication, degenerative spine disease with low back pain, lower extremity edema and severe  obesity, who presents for follow-up consultation of her sleep apnea, well established on CPAP therapy. The patient is unaccompanied today. I last saw her on she reports doing well with CPAP. She had undergone laparoscopic gastric sleeve bariatric surgery on 08/23/2016. She had lost weight. We talked about potentially putting her back in for sleep study to reevaluate after weight loss.   Today, 10/06/2017: I reviewed her CPAP compliance data from 09/05/2017 through 10/04/2017 which is a total of 30 days, during which time she used her CPAP every night with percent used days greater than 4 hours at 97%, indicating excellent compliance with an average usage of 7 hours and 10 minutes, residual AHI 0.6 per hour, leak on the higher side for the 95th percentile at 26.3 mL/m on a pressure of 9 cm with EPR of 3. She reports doing well with her CPAP, up to date with her supplies typically. Using nasal pillows. Weight has plateaued a little bit. She has had recent issues with blurry vision and watery eyes, wonders if it's the Lyrica. She is going to talk to Dr. Ernestina Patches about this. Her GYN started her on low-dose gabapentin for night sweats. She has intermittent lower extremity edema and uses Lasix as needed. She may be due for an eye examination, goes to [facility].   The patient's allergies, current  medications, family history, past medical history, past social history, past surgical history and problem list were reviewed and updated as appropriate.    Previously (copied from previous notes for reference):    I saw her on 08/18/2015, at which time she was doing rather well on CPAP therapy and was fully compliant with it. She had undergone epidural steroid injections in the past for her back pain which were helpful.    I reviewed her CPAP compliance data from 09/05/2016 through 10/04/2016, which is a total of 30 days, during which time she used her CPAP every night with percent used days greater than 4 hours at  100%, indicating superb compliance with an average usage of 7 hours and 43 minutes, residual AHI low at 0.7 per hour, leak acceptable with the 95th percentile at 8.5 L/m on a pressure of 9 cm with EPR of 3.    I first met her on 12/04/2014 at the request of her primary care physician, at which time she reported a family history of OSA, snoring and excessive daytime somnolence. I invited her back for sleep study. She had a split-night sleep study on 01/03/2015. I went over her test results with her in detail today. Baseline sleep efficiency was 68.7% with a latency to sleep of 18 minutes and wake after sleep onset of 45.5 minutes with severe sleep fragmentation noted. She had arousal index of 9 per hour. She had an increased percentage of stage 1 sleep, an increased percentage of slow-wave sleep and REM sleep of 3.6% with a prolonged REM latency. She had no significant PLMS, EKG or EEG changes. She had moderate to loud snoring. Total AHI was 30.1 per hour, average oxygen saturation 91%, nadir was 64%. She was then titrated on CPAP. Sleep efficiency was 72.4% during the second part of the study with sleep latency of 17.5 minutes and wake after sleep onset of 60 minutes with moderate sleep fragmentation noted. She had slow-wave sleep at 22.1%, REM sleep was 35.6%. Average oxygen saturation was 93%, nadir was 86%. CPAP was titrated from 5 cm to 9 cm. AHI was 1 per hour on the final pressure with supine REM sleep achieved. Based on her test results are prescribed CPAP therapy for home use at a pressure of 9 cm.    In the interim, she was seen by Cecille Rubin on 02/19/2015, at which time she was fully compliant with CPAP therapy and doing well.    I reviewed her CPAP compliance data from 07/15/2015 through 08/13/2015 which is a total of 30 days during which time she used her machine every night with percent used days greater than 4 hours at 100%, indicating superb compliance with an average usage of 5 hours and 37  minutes, residual AHI 0.5 per hour, leak low with the 95th percentile at 3.4 L/m on a pressure of 9 cm with EPR of 3.    12/04/2014: She reports snoring and excessive daytime somnolence. I reviewed your office note from 11/12/2014, which you kindly included.    She went to the ER on 11/09/14, after she woke up in the middle of the night a couple with SOB and a sense of choking. She has a family history of obstructive sleep apnea in her older sister who uses a CPAP machine. During the emergency room visit she was noted to have more lower extremity swelling. She was given IV Lasix 1 and her maintenance Lasix was increased to 30 mg from 20 mg daily. She had a  chest x-ray and then a CT angiogram of her chest which showed: 1. No acute cardiopulmonary disease. Specifically, no evidence of pulmonary embolism to the level of the bilateral subsegmental pulmonary arteries.  2. Borderline cardiomegaly. 3. Coronary artery calcifications. 4. Indeterminate punctate (approximately 2 mm) right middle lobe pulmonary nodule. If the patient is at high risk for bronchogenic carcinoma, follow-up chest CT at 1 year is recommended. If the patient is at low risk, no follow-up is needed. She reports a bedtime of around 10 PM. She falls asleep quickly. She is separated. Currently her 72 year old son lives with her but he is moving out. She has a 71 year old daughter who is on her own. 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 Her rise time is 6:30 AM. She does not wake up rested. Her Epworth sleepiness score is 11 out of 24 today, her fatigue score is 37 out of 63. She has restless leg symptoms. These are intermittent. She has occasional leg cramps as well. She has nocturia, usually once or twice per night. She does not drink caffeine daily. She quit smoking in 1992. She drinks alcohol very rarely.    REVIEW OF SYSTEMS: Out of a complete 14 system review of symptoms, the patient complains only of the following symptoms,  sneezing, burning of nose and all other reviewed systems are negative.  ESS: 6   ALLERGIES: Allergies  Allergen Reactions   Metronidazole     Developed pancreatitis after taking this   Adhesive [Tape] Itching and Rash   Latex Itching and Rash   Penicillins Rash    Has patient had a PCN reaction causing immediate rash, facial/tongue/throat swelling, SOB or lightheadedness with hypotension: No Has patient had a PCN reaction causing severe rash involving mucus membranes or skin necrosis: No Has patient had a PCN reaction that required hospitalization No Has patient had a PCN reaction occurring within the last 10 years: No If all of the above answers are "NO", then may proceed with Cephalosporin use.   Vesicare [Solifenacin] Rash     HOME MEDICATIONS: Outpatient Medications Prior to Visit  Medication Sig Dispense Refill   albuterol (PROVENTIL HFA;VENTOLIN HFA) 108 (90 BASE) MCG/ACT inhaler Inhale 1-2 puffs into the lungs every 6 (six) hours as needed for wheezing or shortness of breath. 1 Inhaler 2   Apoaequorin (PREVAGEN PO) Take by mouth daily.     aspirin EC 81 MG tablet Take 81 mg by mouth daily.      Azelastine HCl 137 MCG/SPRAY SOLN PLACE 1 SPRAY IN EACH NOSTRIL ONCE NIGHTLY 30 mL 3   baclofen (LIORESAL) 10 MG tablet TAKE 1/2-1 TABLET BY MOUTH EVERY 8 HOURS AS NEEDED FOR SPASMS 60 tablet 3   betamethasone valerate ointment (VALISONE) 0.1 % Apply a pea sized amount topically BID for up to 2 weeks. Not for daily long term use. 30 g 0   diazepam (VALIUM) 5 MG tablet Take 1 by mouth 1 hour  pre-procedure with very light food. May bring 2nd tablet to appointment. 2 tablet 0   diazepam (VALIUM) 5 MG tablet Take 1 tablet by mouth once daily as needed for neck pain. 30 tablet 1   esomeprazole (NEXIUM) 40 MG capsule TAKE 1 CAPSULE BY MOUTH ONCE DAILY 90 capsule 3   fluticasone (FLONASE) 50 MCG/ACT nasal spray PLACE 2 SPRAYS IN EACH NOSTRIL DAILY AS DIRECTED 16 g 3   fluticasone (FLONASE)  50 MCG/ACT nasal spray Use 1 spray in each nostril daily 48 g 4  furosemide (LASIX) 20 MG tablet Take 3 tablets (60 mg total) by mouth as needed. NEED OV. 90 tablet 3   gabapentin (NEURONTIN) 300 MG capsule TAKE 1 CAPSULE BY MOUTH 3 TIMES DAILY 270 capsule 1   HYDROcodone-acetaminophen (NORCO/VICODIN) 5-325 MG tablet Take 1 tablet by mouth every 6 (six) hours as needed for moderate pain. 12 tablet 0   levothyroxine (SYNTHROID) 75 MCG tablet TAKE 1 TABLET BY MOUTH EVERY MORNING ON AN EMPTY STOMACH 90 tablet 2   lisinopril (ZESTRIL) 40 MG tablet TAKE 1 TABLET BY MOUTH ONCE A DAY 90 tablet 1   loratadine (CLARITIN) 10 MG tablet Take 10 mg by mouth daily.     magnesium oxide (MAG-OX) 400 MG tablet Take 400 mg by mouth daily.     meloxicam (MOBIC) 15 MG tablet TAKE 1 TABLET BY MOUTH ONCE DAILY 90 tablet 3   methocarbamol (ROBAXIN) 500 MG tablet TAKE 1 TABLET BY MOUTH EVERY 8 HOURS AS NEEDED FOR MUSCLE SPASMS 60 tablet 3   metoprolol tartrate (LOPRESSOR) 25 MG tablet TAKE 1 TABLET BY MOUTH 2 TIMES DAILY 180 tablet 3   nystatin cream (MYCOSTATIN) Apply 1 application topically 2 (two) times daily. Apply to affected area BID for up to 7 days. 30 g 1   olmesartan (BENICAR) 20 MG tablet Take 1 tablet (20 mg total) by mouth daily. 90 tablet 3   Omega-3 Fatty Acids (FISH OIL) 1000 MG CAPS Take 1,000 mg by mouth daily.     TURMERIC CURCUMIN PO Take by mouth daily.     vitamin C (ASCORBIC ACID) 500 MG tablet Take 500 mg by mouth daily.     VITAMIN D PO Take by mouth.     doxycycline (VIBRAMYCIN) 100 MG capsule Take 1 capsule by mouth twice daily for 7 days 14 capsule 0   potassium chloride SA (K-DUR) 20 MEQ tablet TAKE 1 TABLET (20 MEQ TOTAL) BY MOUTH DAILY. 90 tablet 1   No facility-administered medications prior to visit.     PAST MEDICAL HISTORY: Past Medical History:  Diagnosis Date   Anemia when she was young   Arthritis    Congestion of nasal sinus    Constipation    takes Colace every other  day   DDD (degenerative disc disease), lumbar    Diabetes mellitus without complication ([place])    per pt had been diagnosed but then "number started going down so my doctor said i didnt have diabetes"   Dry eyes    uses Systane Eye drops daily as needed   GERD (gastroesophageal reflux disease) 12/06/2011   takes Nexium daily   Headache(784.0)    occasionally-d/t congestion   Heart murmur    History of bronchitis 6-7 yrs ago   Hyperlipidemia    Hypertension    takes Metoprolol and Diovan daily   Hypothyroidism 12/06/2011   takes Synthroid daily   Joint pain    Multiple allergies    takes Zyrtec daily;uses Nasonex daily as needed   Nocturia    Numbness    weakness-right hand   OSA on CPAP    Pancreatitis    Peripheral edema    takes Furosemide daily   Shingles    Urinary frequency    Venous insufficiency of leg      PAST SURGICAL HISTORY: Past Surgical History:  Procedure Laterality Date   ABDOMINAL HYSTERECTOMY     uterine prolapse   CARDIAC SURGERY     CHOLECYSTECTOMY     COLONOSCOPY  ESOPHAGOGASTRODUODENOSCOPY  5-18yr ago   FOOT SURGERY Bilateral    KNEE ARTHROSCOPY     LAPAROSCOPIC GASTRIC SLEEVE RESECTION N/A 08/23/2016   Procedure: LAPAROSCOPIC GASTRIC SLEEVE RESECTION WITH UPPER ENDO;  Surgeon: MJohnathan Hausen MD;  Location: [facility];  Service: General;  Laterality: N/A;   POSTERIOR LUMBAR FUSION  03/10/2009   L3-4 and L3-4 fusion, Dr. ELeeroy Cha  SHOULDER ARTHROSCOPY WITH ROTATOR CUFF REPAIR AND SUBACROMIAL DECOMPRESSION Right 08/23/2013   Procedure: RIGHT SHOULDER ARTHROSCOPY WITH  SUBACROMIAL DECOMPRESSION DISTAL CLAVICLE RESECTION AND  ROTATOR CUFF REPAIR ;  Surgeon: KMarin Shutter MD;  Location[facility]  Service: Orthopedics;  Laterality: Right;   TUBAL LIGATION       FAMILY HISTORY: Family History  Problem Relation Age of Onset   Hypertension Mother    Hypertension Father    Hypertension Sister    Kidney disease Sister        KIDNEL  TRANSPLANT   Thyroid disease Sister    Thyroid disease Sister    Hypertension Sister    Breast cancer Maternal Aunt        >50; passed away from it   Breast cancer Maternal Aunt        >50     SOCIAL HISTORY: Social History   Socioeconomic History   Marital status: Divorced    Spouse name: Not on file   Number of children: 2   Years of education: College   Highest education level: Not on file  Occupational History    Employer: [employer]    Comment: [employer]  Tobacco Use   Smoking status: Former    Packs/day: 0.75    Years: 40.00    Pack years: 30.00    Types: Cigarettes    Quit date: 1993    Years since quitting: 29.8   Smokeless tobacco: Never   Tobacco comments:    quit smoking in 1993  Vaping Use   Vaping Use: Never used  Substance and Sexual Activity   Alcohol use: Yes    Alcohol/week: 0.0 standard drinks    Comment: rarely;; mixed drink    Drug use: No   Sexual activity: Yes    Partners: Male    Birth control/protection: Surgical    Comment: Hysterectomy  Other Topics Concern   Not on file  Social History Narrative   Caffeine: Coffee   Social Determinants of Health   Financial Resource Strain: Not on file  Food Insecurity: Not on file  Transportation Needs: Not on file  Physical Activity: Not on file  Stress: Not on file  Social Connections: Not on file  Intimate Partner Violence: Not on file     PHYSICAL EXAM  Vitals:   12/01/20 0801  BP: 139/85  Pulse: 84  Weight: 251 lb 8 oz (114.1 kg)  Height: 5' 2"  (1.575 m)   Body mass index is 46 kg/m.  Generalized: Well developed, in no acute distress  Cardiology: normal rate and rhythm, no murmur auscultated  Respiratory: clear to auscultation bilaterally    Neurological examination  Mentation: Alert oriented to time, place, history taking. Follows all commands speech and language fluent Cranial nerve II-XII: Pupils were equal round reactive to light. Extraocular movements were full, visual  field were full on confrontational test. Facial sensation and strength were normal. Head turning and shoulder shrug  were normal and symmetric. Motor: The motor testing reveals 5 over 5 strength of all 4 extremities. Good symmetric motor tone is noted throughout.  Gait  and station: Gait is normal.    DIAGNOSTIC DATA (LABS, IMAGING, TESTING) - I reviewed patient records, labs, notes, testing and imaging myself where available.  Lab Results  Component Value Date   WBC 6.9 08/06/2020   HGB 13.1 08/06/2020   HCT 41.0 08/06/2020   MCV 84 08/06/2020   PLT 344 08/06/2020      Component Value Date/Time   NA 141 08/06/2020 1439   K 4.5 08/06/2020 1439   CL 102 08/06/2020 1439   CO2 25 08/06/2020 1439   GLUCOSE 91 08/06/2020 1439   GLUCOSE 79 09/17/2017 1331   BUN 17 08/06/2020 1439   CREATININE 0.81 08/06/2020 1439   CREATININE 0.78 08/20/2015 0853   CALCIUM 9.5 08/06/2020 1439   PROT 7.5 08/06/2020 1439   ALBUMIN 4.3 08/06/2020 1439   AST 19 08/06/2020 1439   ALT 14 08/06/2020 1439   ALKPHOS 153 (H) 08/06/2020 1439   BILITOT 0.2 08/06/2020 1439   GFRNONAA 95 11/21/2019 1132   GFRNONAA 86 08/20/2015 0853   GFRAA 110 11/21/2019 1132   GFRAA >89 08/20/2015 0853   Lab Results  Component Value Date   CHOL 214 (H) 08/07/2020   HDL 74 08/07/2020   LDLCALC 125 (H) 08/07/2020   TRIG 88 08/07/2020   CHOLHDL 2.9 08/07/2020   Lab Results  Component Value Date   HGBA1C 6.0 (H) 08/07/2020   No results found for: [id number] Lab Results  Component Value Date   TSH 1.100 08/06/2020    No flowsheet data found.   No flowsheet data found.   ASSESSMENT AND PLAN  60 y.o. year old female  has a past medical history of Anemia (when she was young), Arthritis, Congestion of nasal sinus, Constipation, DDD (degenerative disc disease), lumbar, Diabetes mellitus without complication ([place]), Dry eyes, GERD (gastroesophageal reflux disease) (12/06/2011), Headache(784.0), Heart murmur, History  of bronchitis (6-7 yrs ago), Hyperlipidemia, Hypertension, Hypothyroidism (12/06/2011), Joint pain, Multiple allergies, Nocturia, Numbness, OSA on CPAP, Pancreatitis, Peripheral edema, Shingles, Urinary frequency, and Venous insufficiency of leg. here with    OSA on CPAP - Plan: For home use only DME continuous positive airway pressure (CPAP), For home use only DME continuous positive airway pressure (CPAP), For home use only DME continuous positive airway pressure (CPAP)  Genora reports that she has noted more air leaking and recurrent sneezing with concerns of sinus infections over the last few months. I will send her for mask refitting. We will also order a new CPAP as her machine if 60 years old. We will continue current settings but may consider adjustment in pressure pending mask refitting and tolerance of new machine. She was encouraged to continue using CPAP nightly for at least 4 hours. Current compliance report shows excellent compliance. She will return 31-90 days following receipt of new CPAP machine.    Orders Placed This Encounter  Procedures   For home use only DME continuous positive airway pressure (CPAP)    Supplies    Order Specific Question:   Length of Need    Answer:   Lifetime    Order Specific Question:   Patient has OSA or probable OSA    Answer:   Yes    Order Specific Question:   Is the patient currently using CPAP in the home    Answer:   Yes    Order Specific Question:   Settings    Answer:   Other see comments    Order Specific Question:   CPAP supplies needed  Answer:   Mask, headgear, cushions, filters, heated tubing and water chamber   For home use only DME continuous positive airway pressure (CPAP)    Mask refitting    Order Specific Question:   Length of Need    Answer:   Lifetime    Order Specific Question:   Patient has OSA or probable OSA    Answer:   Yes    Order Specific Question:   Is the patient currently using CPAP in the home    Answer:   Yes     Order Specific Question:   Settings    Answer:   Other see comments    Order Specific Question:   CPAP supplies needed    Answer:   Mask, headgear, cushions, filters, heated tubing and water chamber   For home use only DME continuous positive airway pressure (CPAP)    New CPAP with same settings. Set pressure 9cmH20, EPR 3.    Order Specific Question:   Length of Need    Answer:   Lifetime    Order Specific Question:   Patient has OSA or probable OSA    Answer:   Yes    Order Specific Question:   Is the patient currently using CPAP in the home    Answer:   Yes    Order Specific Question:   Settings    Answer:   Other see comments    Order Specific Question:   CPAP supplies needed    Answer:   Mask, headgear, cushions, filters, heated tubing and water chamber      No orders of the defined types were placed in this encounter.     Debbora Presto, MSN, FNP-C 12/01/2020, 8:31 AM  [facility] [address], [place],  [place] [address] [phone]

## 2020-11-28 ENCOUNTER — Other Ambulatory Visit (HOSPITAL_COMMUNITY): Payer: Self-pay

## 2020-12-01 ENCOUNTER — Ambulatory Visit (INDEPENDENT_AMBULATORY_CARE_PROVIDER_SITE_OTHER): Payer: 59 | Admitting: Family Medicine

## 2020-12-01 ENCOUNTER — Encounter: Payer: Self-pay | Admitting: Family Medicine

## 2020-12-01 VITALS — BP 139/85 | HR 84 | Ht 62.0 in | Wt 251.5 lb

## 2020-12-01 DIAGNOSIS — G4733 Obstructive sleep apnea (adult) (pediatric): Secondary | ICD-10-CM

## 2020-12-01 DIAGNOSIS — Z9989 Dependence on other enabling machines and devices: Secondary | ICD-10-CM | POA: Diagnosis not present

## 2020-12-01 NOTE — Patient Instructions (Addendum)
Please continue using your CPAP regularly. While your insurance requires that you use CPAP at least 4 hours each night on 70% of the nights, I recommend, that you not skip any nights and use it throughout the night if you can. Getting used to CPAP and staying with the treatment long term does take time and patience and discipline. Untreated obstructive sleep apnea when it is moderate to severe can have an adverse impact on cardiovascular health and raise her risk for heart disease, arrhythmias, hypertension, congestive heart failure, stroke and diabetes. Untreated obstructive sleep apnea causes sleep disruption, nonrestorative sleep, and sleep deprivation. This can have an impact on your day to day functioning and cause daytime sleepiness and impairment of cognitive function, memory loss, mood disturbance, and problems focussing. Using CPAP regularly can improve these symptoms.   Please call [facility] to discuss mask refitting. I am also sending orders for a new CPAP machine as yours is greater than 44 years old.   Follow up 31-90 days after receiving your new machine

## 2020-12-18 ENCOUNTER — Other Ambulatory Visit (HOSPITAL_COMMUNITY): Payer: Self-pay

## 2020-12-19 ENCOUNTER — Other Ambulatory Visit (HOSPITAL_COMMUNITY): Payer: Self-pay

## 2020-12-22 ENCOUNTER — Other Ambulatory Visit (HOSPITAL_COMMUNITY): Payer: Self-pay

## 2020-12-22 MED ORDER — LEVOTHYROXINE SODIUM 75 MCG PO TABS
75.0000 ug | ORAL_TABLET | Freq: Every morning | ORAL | 2 refills | Status: DC
  Filled 2020-12-22 – 2021-01-01 (×2): qty 90, 90d supply, fill #0
  Filled 2021-03-28: qty 90, 90d supply, fill #1
  Filled 2021-05-30: qty 90, 90d supply, fill #2

## 2020-12-24 NOTE — Progress Notes (Signed)
60 y.o. G3P2002 Divorced Black or African American Not Hispanic or Latino female here for annual exam.  H/O hysterectomy, still has her ovaries.   H/O cystocele, only notices it on occasion, not bothersome. No urinary c/o. Some issues with constipation, on stool softeners.    On gabapentin for vasomotor symptoms and pain. Her pain is better since she had a nerve stimulator placed for back pain. Still with some knee pain. Vasomotor symptoms are a little better.   Multiple medical problems, including obesity, prediabetes, HTN and hyperlipidemia.  No LMP recorded. Patient has had a hysterectomy.          Sexually active: No.  The current method of family planning is status post hysterectomy.    Exercising: Yes.    The patient does not participate in regular exercise at present. Smoker:  no  Health Maintenance: Pap:  2015 normal per patient  History of abnormal Pap:  no MMG:  08/07/20 Density B Bi-rads 3 probably benign left breast U/S Bi-rads 3 probably benign. F/U scheduled in 1/23. BMD:   a few years ago normal per patient  Colonoscopy: 2013 normal per patient , scheduled for next year.  TDaP:  03/25/13  Gardasil: n/a   reports that she quit smoking about 29 years ago. Her smoking use included cigarettes. She has a 30.00 pack-year smoking history. She has never used smokeless tobacco. She reports current alcohol use. She reports that she does not use drugs.  Just occasional ETOH. Works in [employer]. 2 kids, local. 3 grandchildren (36, 90, 65).  Past Medical History:  Diagnosis Date   Anemia when she was young   Arthritis    Congestion of nasal sinus    Constipation    takes Colace every other day   DDD (degenerative disc disease), lumbar    Diabetes mellitus without complication ([place])    per pt had been diagnosed but then "number started going down so my doctor said i didnt have diabetes"   Dry eyes    uses Systane Eye drops daily as needed   GERD (gastroesophageal reflux  disease) 12/06/2011   takes Nexium daily   Headache(784.0)    occasionally-d/t congestion   Heart murmur    History of bronchitis 6-7 yrs ago   Hyperlipidemia    Hypertension    takes Metoprolol and Diovan daily   Hypothyroidism 12/06/2011   takes Synthroid daily   Joint pain    Multiple allergies    takes Zyrtec daily;uses Nasonex daily as needed   Nocturia    Numbness    weakness-right hand   OSA on CPAP    Pancreatitis    Peripheral edema    takes Furosemide daily   Shingles    Urinary frequency    Venous insufficiency of leg     Past Surgical History:  Procedure Laterality Date   ABDOMINAL HYSTERECTOMY     uterine prolapse   CARDIAC SURGERY     CHOLECYSTECTOMY     COLONOSCOPY     ESOPHAGOGASTRODUODENOSCOPY  5-28yrs ago   FOOT SURGERY Bilateral    KNEE ARTHROSCOPY     LAPAROSCOPIC GASTRIC SLEEVE RESECTION N/A 08/23/2016   Procedure: LAPAROSCOPIC GASTRIC SLEEVE RESECTION WITH UPPER ENDO;  Surgeon: Johnathan Hausen, MD;  Location: [facility];  Service: General;  Laterality: N/A;   POSTERIOR LUMBAR FUSION  03/10/2009   L3-4 and L3-4 fusion, Dr. Leeroy Cha   SHOULDER ARTHROSCOPY WITH ROTATOR CUFF REPAIR AND SUBACROMIAL DECOMPRESSION Right 08/23/2013   Procedure: RIGHT SHOULDER ARTHROSCOPY  WITH  SUBACROMIAL DECOMPRESSION DISTAL CLAVICLE RESECTION AND  ROTATOR CUFF REPAIR ;  Surgeon: Marin Shutter, MD;  Location: [facility];  Service: Orthopedics;  Laterality: Right;   TUBAL LIGATION      Current Outpatient Medications  Medication Sig Dispense Refill   albuterol (PROVENTIL HFA;VENTOLIN HFA) 108 (90 BASE) MCG/ACT inhaler Inhale 1-2 puffs into the lungs every 6 (six) hours as needed for wheezing or shortness of breath. 1 Inhaler 2   Apoaequorin (PREVAGEN PO) Take by mouth daily.     aspirin EC 81 MG tablet Take 81 mg by mouth daily.      baclofen (LIORESAL) 10 MG tablet TAKE 1/2-1 TABLET BY MOUTH EVERY 8 HOURS AS NEEDED FOR SPASMS 60 tablet 3   betamethasone valerate ointment  (VALISONE) 0.1 % Apply a pea sized amount topically BID for up to 2 weeks. Not for daily long term use. 30 g 0   diazepam (VALIUM) 5 MG tablet Take 1 by mouth 1 hour  pre-procedure with very light food. May bring 2nd tablet to appointment. 2 tablet 0   esomeprazole (NEXIUM) 40 MG capsule TAKE 1 CAPSULE BY MOUTH ONCE DAILY 90 capsule 3   fluticasone (FLONASE) 50 MCG/ACT nasal spray Use 1 spray in each nostril daily 48 g 4   furosemide (LASIX) 20 MG tablet Take 3 tablets (60 mg total) by mouth as needed. NEED OV. 90 tablet 3   gabapentin (NEURONTIN) 300 MG capsule TAKE 1 CAPSULE BY MOUTH 3 TIMES DAILY 270 capsule 1   levothyroxine (SYNTHROID) 75 MCG tablet TAKE 1 TABLET BY MOUTH EVERY MORNING ON AN EMPTY STOMACH 90 tablet 2   lisinopril (ZESTRIL) 40 MG tablet TAKE 1 TABLET BY MOUTH ONCE A DAY 90 tablet 1   loratadine (CLARITIN) 10 MG tablet Take 10 mg by mouth daily.     magnesium oxide (MAG-OX) 400 MG tablet Take 400 mg by mouth daily.     meloxicam (MOBIC) 15 MG tablet TAKE 1 TABLET BY MOUTH ONCE DAILY 90 tablet 3   methocarbamol (ROBAXIN) 500 MG tablet TAKE 1 TABLET BY MOUTH EVERY 8 HOURS AS NEEDED FOR MUSCLE SPASMS 60 tablet 3   metoprolol tartrate (LOPRESSOR) 25 MG tablet TAKE 1 TABLET BY MOUTH 2 TIMES DAILY 180 tablet 3   nystatin cream (MYCOSTATIN) Apply 1 application topically 2 (two) times daily. Apply to affected area BID for up to 7 days. 30 g 1   vitamin C (ASCORBIC ACID) 500 MG tablet Take 500 mg by mouth daily.     VITAMIN D PO Take by mouth.     Azelastine HCl 137 MCG/SPRAY SOLN PLACE 1 SPRAY IN EACH NOSTRIL ONCE NIGHTLY 30 mL 3   diazepam (VALIUM) 5 MG tablet Take 1 tablet by mouth once daily as needed for neck pain. 30 tablet 1   fluticasone (FLONASE) 50 MCG/ACT nasal spray PLACE 2 SPRAYS IN EACH NOSTRIL DAILY AS DIRECTED 16 g 3   Omega-3 Fatty Acids (FISH OIL) 1000 MG CAPS Take 1,000 mg by mouth daily. (Patient not taking: Reported on 12/25/2020)     potassium chloride SA (K-DUR)  20 MEQ tablet TAKE 1 TABLET (20 MEQ TOTAL) BY MOUTH DAILY. 90 tablet 1   TURMERIC CURCUMIN PO Take by mouth daily. (Patient not taking: Reported on 12/25/2020)     No current facility-administered medications for this visit.    Family History  Problem Relation Age of Onset   Hypertension Mother    Hypertension Father    Hypertension Sister  Kidney disease Sister        Scheryl Darter TRANSPLANT   Thyroid disease Sister    Thyroid disease Sister    Hypertension Sister    Breast cancer Maternal Aunt        >50; passed away from it   Breast cancer Maternal Aunt        >50    Review of Systems  All other systems reviewed and are negative.  Exam:   BP (!) 150/78   Pulse 62   Ht 5\' 3"  (1.6 m)   Wt 255 lb (115.7 kg)   SpO2 100%   BMI 45.17 kg/m   Weight change: @WEIGHTCHANGE @ Height:   Height: 5\' 3"  (160 cm)  Ht Readings from Last 3 Encounters:  12/25/20 5\' 3"  (1.6 m)  12/01/20 5\' 2"  (1.575 m)  07/30/20 5\' 3"  (1.6 m)    General appearance: alert, cooperative and appears stated age Head: Normocephalic, without obvious abnormality, atraumatic Neck: no adenopathy, supple, symmetrical, trachea midline and thyroid normal to inspection and palpation Lungs: clear to auscultation bilaterally Cardiovascular: regular rate and rhythm Breasts: normal appearance, no masses or tenderness Abdomen: soft, non-tender; non distended,  no masses,  no organomegaly Extremities: extremities normal, atraumatic, no cyanosis or edema Skin: Skin color, texture, turgor normal. No rashes or lesions Lymph nodes: Cervical, supraclavicular, and axillary nodes normal. No abnormal inguinal nodes palpated Neurologic: Grossly normal   Pelvic: External genitalia:  no lesions              Urethra:  normal appearing urethra with no masses, tenderness or lesions              Bartholins and Skenes: normal                 Vagina: atrophic appearing vagina with a moderate grade 2 cystocele. Prolapse is within one cm  of the introitus with valsalva (only examined supine). No other significant prolapse.              Cervix: absent               Bimanual Exam:  Uterus:  uterus absent              Adnexa: no mass, fullness, tenderness               Rectovaginal: Confirms               Anus:  normal sphincter tone, no lesions  Marisa Sprinkles, CMA chaperoned for the exam.  1. Well woman exam Discussed breast self exam Discussed calcium and vit D intake Mammogram and colonoscopy are scheduled Labs with primary  2. Cystocele, midline Not symptomatic Avoid heavy lifting and straining Call with symptoms  3. Vasomotor symptoms due to menopause Mostly tolerable with gabapentin

## 2020-12-25 ENCOUNTER — Other Ambulatory Visit: Payer: Self-pay

## 2020-12-25 ENCOUNTER — Ambulatory Visit (INDEPENDENT_AMBULATORY_CARE_PROVIDER_SITE_OTHER): Payer: 59 | Admitting: Obstetrics and Gynecology

## 2020-12-25 ENCOUNTER — Ambulatory Visit: Payer: 59 | Admitting: Family Medicine

## 2020-12-25 ENCOUNTER — Encounter: Payer: Self-pay | Admitting: Obstetrics and Gynecology

## 2020-12-25 VITALS — BP 150/78 | HR 62 | Ht 63.0 in | Wt 255.0 lb

## 2020-12-25 DIAGNOSIS — Z01419 Encounter for gynecological examination (general) (routine) without abnormal findings: Secondary | ICD-10-CM | POA: Diagnosis not present

## 2020-12-25 DIAGNOSIS — N951 Menopausal and female climacteric states: Secondary | ICD-10-CM

## 2020-12-25 DIAGNOSIS — N8111 Cystocele, midline: Secondary | ICD-10-CM

## 2020-12-25 DIAGNOSIS — R413 Other amnesia: Secondary | ICD-10-CM | POA: Insufficient documentation

## 2020-12-25 NOTE — Patient Instructions (Addendum)
EXERCISE   We recommended that you start or continue a regular exercise program for good health. Physical activity is anything that gets your body moving, some is better than none. The CDC recommends 150 minutes per week of Moderate-Intensity Aerobic Activity and 2 or more days of Muscle Strengthening Activity.  Benefits of exercise are limitless: helps weight loss/weight maintenance, improves mood and energy, helps with depression and anxiety, improves sleep, tones and strengthens muscles, improves balance, improves bone density, protects from chronic conditions such as heart disease, high blood pressure and diabetes and so much more. To learn more visit: https://www.cdc.gov/physicalactivity/index.html  DIET: Good nutrition starts with a healthy diet of fruits, vegetables, whole grains, and lean protein sources. Drink plenty of water for hydration. Minimize empty calories, sodium, sweets. For more information about dietary recommendations visit: https://health.gov/our-work/nutrition-physical-activity/dietary-guidelines and https://www.myplate.gov/  ALCOHOL:  Women should limit their alcohol intake to no more than 7 drinks/beers/glasses of wine (combined, not each!) per week. Moderation of alcohol intake to this level decreases your risk of breast cancer and liver damage.  If you are concerned that you may have a problem, or your friends have told you they are concerned about your drinking, there are many resources to help. A well-known program that is free, effective, and available to all people all over the nation is Alcoholics Anonymous.  Check out this site to learn more: https://www.aa.org/   CALCIUM AND VITAMIN D:  Adequate intake of calcium and Vitamin D are recommended for bone health.  You should be getting between 1000-1200 mg of calcium and 800 units of Vitamin D daily between diet and supplements  PAP SMEARS:  Pap smears, to check for cervical cancer or precancers,  have traditionally been  done yearly, scientific advances have shown that most women can have pap smears less often.  However, every woman still should have a physical exam from her gynecologist every year. It will include a breast check, inspection of the vulva and vagina to check for abnormal growths or skin changes, a visual exam of the cervix, and then an exam to evaluate the size and shape of the uterus and ovaries. We will also provide age appropriate advice regarding health maintenance, like when you should have certain vaccines, screening for sexually transmitted diseases, bone density testing, colonoscopy, mammograms, etc.   MAMMOGRAMS:  All women over 40 years old should have a routine mammogram.   COLON CANCER SCREENING: Now recommend starting at age 45. At this time colonoscopy is not covered for routine screening until 50. There are take home tests that can be done between 45-49.   COLONOSCOPY:  Colonoscopy to screen for colon cancer is recommended for all women at age 50.  We know, you hate the idea of the prep.  We agree, BUT, having colon cancer and not knowing it is worse!!  Colon cancer so often starts as a polyp that can be seen and removed at colonscopy, which can quite literally save your life!  And if your first colonoscopy is normal and you have no family history of colon cancer, most women don't have to have it again for 10 years.  Once every ten years, you can do something that may end up saving your life, right?  We will be happy to help you get it scheduled when you are ready.  Be sure to check your insurance coverage so you understand how much it will cost.  It may be covered as a preventative service at no cost, but you should check   your particular policy.      Breast Self-Awareness Breast self-awareness means being familiar with how your breasts look and feel. It involves checking your breasts regularly and reporting any changes to your health care provider. Practicing breast self-awareness is  important. A change in your breasts can be a sign of a serious medical problem. Being familiar with how your breasts look and feel allows you to find any problems early, when treatment is more likely to be successful. All women should practice breast self-awareness, including women who have had breast implants. How to do a breast self-exam One way to learn what is normal for your breasts and whether your breasts are changing is to do a breast self-exam. To do a breast self-exam: Look for Changes  Remove all the clothing above your waist. Stand in front of a mirror in a room with good lighting. Put your hands on your hips. Push your hands firmly downward. Compare your breasts in the mirror. Look for differences between them (asymmetry), such as: Differences in shape. Differences in size. Puckers, dips, and bumps in one breast and not the other. Look at each breast for changes in your skin, such as: Redness. Scaly areas. Look for changes in your nipples, such as: Discharge. Bleeding. Dimpling. Redness. A change in position. Feel for Changes Carefully feel your breasts for lumps and changes. It is best to do this while lying on your back on the floor and again while sitting or standing in the shower or tub with soapy water on your skin. Feel each breast in the following way: Place the arm on the side of the breast you are examining above your head. Feel your breast with the other hand. Start in the nipple area and make  inch (2 cm) overlapping circles to feel your breast. Use the pads of your three middle fingers to do this. Apply light pressure, then medium pressure, then firm pressure. The light pressure will allow you to feel the tissue closest to the skin. The medium pressure will allow you to feel the tissue that is a little deeper. The firm pressure will allow you to feel the tissue close to the ribs. Continue the overlapping circles, moving downward over the breast until you feel your  ribs below your breast. Move one finger-width toward the center of the body. Continue to use the  inch (2 cm) overlapping circles to feel your breast as you move slowly up toward your collarbone. Continue the up and down exam using all three pressures until you reach your armpit.  Write Down What You Find  Write down what is normal for each breast and any changes that you find. Keep a written record with breast changes or normal findings for each breast. By writing this information down, you do not need to depend only on memory for size, tenderness, or location. Write down where you are in your menstrual cycle, if you are still menstruating. If you are having trouble noticing differences in your breasts, do not get discouraged. With time you will become more familiar with the variations in your breasts and more comfortable with the exam. How often should I examine my breasts? Examine your breasts every month. If you are breastfeeding, the best time to examine your breasts is after a feeding or after using a breast pump. If you menstruate, the best time to examine your breasts is 5-7 days after your period is over. During your period, your breasts are lumpier, and it may be more   difficult to notice changes. When should I see my health care provider? See your health care provider if you notice: A change in shape or size of your breasts or nipples. A change in the skin of your breast or nipples, such as a reddened or scaly area. Unusual discharge from your nipples. A lump or thick area that was not there before. Pain in your breasts. Anything that concerns you. About Cystocele  Overview  The pelvic organs, including the bladder, are normally supported by pelvic floor muscles and ligaments.  When these muscles and ligaments are stretched, weakened or torn, the wall between the bladder and the vagina sags or herniates causing a prolapse, sometimes called a cystocele.  This condition may cause  discomfort and problems with emptying the bladder.  It can be present in various stages.  Some people are not aware of the changes.  Others may notice changes at the vaginal opening or a feeling of the bladder dropping outside the body.  Causes of a Cystocele  A cystocele is usually caused by muscle straining or stretching during childbirth.  In addition, cystocele is more common after menopause, because the hormone estrogen helps keep the elastic tissues around the pelvic organs strong.  A cystocele is more likely to occur when levels of estrogen decrease.  Other causes include: heavy lifting, chronic coughing, previous pelvic surgery and obesity.  Symptoms  A bladder that has dropped from its normal position may cause: unwanted urine leakage (stress incontinence), frequent urination or urge to urinate, incomplete emptying of the bladder (not feeling bladder relief after emptying), pain or discomfort in the vagina, pelvis, groin, lower back or lower abdomen and frequent urinary tract infections.  Mild cases may not cause any symptoms.  Treatment Options Pelvic floor (Kegel) exercises:  Strength training the muscles in your genital area Behavioral changes: Treating and preventing constipation, taking time to empty your bladder properly, learning to lift properly and/or avoid heavy lifting when possible, stopping smoking, avoiding weight gain and treating a chronic cough or bronchitis. A pessary: A vaginal support device is sometimes used to help pelvic support caused by muscle and ligament changes. Surgery: Surgical repair may be necessary if symptoms cannot be managed with exercise, behavioral changes and a pessary.  Surgery is usually considered for severe cases.   2007, [employer]

## 2020-12-30 ENCOUNTER — Other Ambulatory Visit (HOSPITAL_COMMUNITY): Payer: Self-pay

## 2021-01-02 ENCOUNTER — Other Ambulatory Visit (HOSPITAL_COMMUNITY): Payer: Self-pay

## 2021-01-05 DIAGNOSIS — G4733 Obstructive sleep apnea (adult) (pediatric): Secondary | ICD-10-CM | POA: Diagnosis not present

## 2021-01-06 DIAGNOSIS — G4733 Obstructive sleep apnea (adult) (pediatric): Secondary | ICD-10-CM | POA: Diagnosis not present

## 2021-01-11 DIAGNOSIS — Z23 Encounter for immunization: Secondary | ICD-10-CM | POA: Diagnosis not present

## 2021-01-25 HISTORY — PX: CATARACT EXTRACTION, BILATERAL: SHX1313

## 2021-01-28 ENCOUNTER — Other Ambulatory Visit (HOSPITAL_COMMUNITY): Payer: Self-pay

## 2021-01-31 ENCOUNTER — Other Ambulatory Visit (HOSPITAL_COMMUNITY): Payer: Self-pay

## 2021-02-02 ENCOUNTER — Other Ambulatory Visit (HOSPITAL_COMMUNITY): Payer: Self-pay

## 2021-02-02 MED ORDER — OLMESARTAN MEDOXOMIL 20 MG PO TABS
20.0000 mg | ORAL_TABLET | Freq: Every day | ORAL | 3 refills | Status: DC
  Filled 2021-02-02: qty 90, 90d supply, fill #0
  Filled 2021-05-09: qty 90, 90d supply, fill #1
  Filled 2021-07-14 – 2021-08-07 (×2): qty 90, 90d supply, fill #2
  Filled 2021-11-05: qty 30, 30d supply, fill #3
  Filled 2021-11-06: qty 60, 60d supply, fill #3

## 2021-02-03 ENCOUNTER — Other Ambulatory Visit (HOSPITAL_COMMUNITY): Payer: Self-pay

## 2021-02-04 ENCOUNTER — Other Ambulatory Visit (HOSPITAL_COMMUNITY): Payer: Self-pay

## 2021-02-04 MED ORDER — METOPROLOL TARTRATE 25 MG PO TABS
25.0000 mg | ORAL_TABLET | Freq: Two times a day (BID) | ORAL | 0 refills | Status: DC
  Filled 2021-02-04 – 2021-02-28 (×3): qty 180, 90d supply, fill #0

## 2021-02-04 MED ORDER — ESOMEPRAZOLE MAGNESIUM 40 MG PO CPDR
40.0000 mg | DELAYED_RELEASE_CAPSULE | Freq: Every day | ORAL | 0 refills | Status: DC
  Filled 2021-02-04 – 2021-05-09 (×2): qty 90, 90d supply, fill #0

## 2021-02-04 MED ORDER — MELOXICAM 15 MG PO TABS
15.0000 mg | ORAL_TABLET | Freq: Every day | ORAL | 0 refills | Status: DC
  Filled 2021-02-04 – 2021-05-09 (×2): qty 90, 90d supply, fill #0

## 2021-02-05 DIAGNOSIS — G4733 Obstructive sleep apnea (adult) (pediatric): Secondary | ICD-10-CM | POA: Diagnosis not present

## 2021-02-11 ENCOUNTER — Other Ambulatory Visit (HOSPITAL_COMMUNITY): Payer: Self-pay

## 2021-02-11 DIAGNOSIS — H524 Presbyopia: Secondary | ICD-10-CM | POA: Diagnosis not present

## 2021-02-12 ENCOUNTER — Ambulatory Visit
Admission: RE | Admit: 2021-02-12 | Discharge: 2021-02-12 | Disposition: A | Discharge status: Home / Self Care | Payer: 59 | Source: Ambulatory Visit | Attending: Obstetrics and Gynecology | Admitting: Obstetrics and Gynecology

## 2021-02-12 ENCOUNTER — Other Ambulatory Visit: Payer: Self-pay

## 2021-02-12 ENCOUNTER — Ambulatory Visit: Payer: Self-pay

## 2021-02-12 ENCOUNTER — Encounter: Payer: Self-pay | Admitting: Surgery

## 2021-02-12 ENCOUNTER — Ambulatory Visit (INDEPENDENT_AMBULATORY_CARE_PROVIDER_SITE_OTHER): Payer: 59 | Admitting: Surgery

## 2021-02-12 VITALS — BP 152/88 | HR 72 | Ht 63.0 in | Wt 255.0 lb

## 2021-02-12 DIAGNOSIS — R928 Other abnormal and inconclusive findings on diagnostic imaging of breast: Secondary | ICD-10-CM | POA: Diagnosis not present

## 2021-02-12 DIAGNOSIS — N63 Unspecified lump in unspecified breast: Secondary | ICD-10-CM

## 2021-02-12 DIAGNOSIS — M170 Bilateral primary osteoarthritis of knee: Secondary | ICD-10-CM

## 2021-02-12 DIAGNOSIS — N6322 Unspecified lump in the left breast, upper inner quadrant: Secondary | ICD-10-CM | POA: Diagnosis not present

## 2021-02-12 NOTE — Progress Notes (Unsigned)
Office Visit Note   Patient: [name]           Date of Birth: 1960/04/06           MRN: [id number] Visit Date: 02/12/2021              Requested by: Velna Hatchet, MD [address] [place],  [place] [address] PCP: Velna Hatchet, MD   Assessment & Plan: Visit Diagnoses:  1. Bilateral primary osteoarthritis of knee     Plan: ***  Follow-Up Instructions: Return in about 1 week (around 02/19/2021) for with dr yates to discuss total knee replacement.   Orders:  Orders Placed This Encounter  Procedures   XR KNEE 3 VIEW RIGHT   No orders of the defined types were placed in this encounter.     Procedures: No procedures performed   Clinical Data: No additional findings.   Subjective: Chief Complaint  Patient presents with   Right Knee - Pain    HPI  Review of Systems   Objective: Vital Signs: BP (!) 152/88    Pulse 72    Ht 5\' 3"  (1.6 m)    Wt 255 lb (115.7 kg)    BMI 45.17 kg/m   Physical Exam  Ortho Exam  Specialty Comments:  No specialty comments available.  Imaging: US BREAST LTD UNI LEFT INC AXILLA  Result Date: 02/12/2021 CLINICAL DATA:  One year follow-up of a left breast mass EXAM: DIGITAL DIAGNOSTIC BILATERAL MAMMOGRAM WITH TOMOSYNTHESIS AND CAD; ULTRASOUND LEFT BREAST LIMITED TECHNIQUE: Bilateral digital diagnostic mammography and breast tomosynthesis was performed. The images were evaluated with computer-aided detection.; Targeted ultrasound examination of the left breast was performed. COMPARISON:  Previous exam(s). ACR Breast Density Category b: There are scattered areas of fibroglandular density. FINDINGS: The mass in the medial left breast is no longer visualized mammographically. No suspicious masses, calcifications, or distortion are identified in either breast. On physical exam, no suspicious lumps are identified. Targeted ultrasound is performed, showing near complete resolution of the previously identified probably benign mass. Only  a wisp of increased echogenicity remains in the superficial subcutaneous fat in the left breast at 10 o'clock, 9 cm from the nipple. IMPRESSION: Near complete resolution of the probably benign left breast mass with only minimal visualization with ultrasound. The finding is now considered benign. No other evidence of malignancy. RECOMMENDATION: Annual screening mammography. I have discussed the findings and recommendations with the patient. If applicable, a reminder letter will be sent to the patient regarding the next appointment. BI-RADS CATEGORY  2: Benign. Electronically Signed   By: Dorise Bullion III M.D.   On: 02/12/2021 08:57  MM DIAG BREAST TOMO BILATERAL  Result Date: 02/12/2021 CLINICAL DATA:  One year follow-up of a left breast mass EXAM: DIGITAL DIAGNOSTIC BILATERAL MAMMOGRAM WITH TOMOSYNTHESIS AND CAD; ULTRASOUND LEFT BREAST LIMITED TECHNIQUE: Bilateral digital diagnostic mammography and breast tomosynthesis was performed. The images were evaluated with computer-aided detection.; Targeted ultrasound examination of the left breast was performed. COMPARISON:  Previous exam(s). ACR Breast Density Category b: There are scattered areas of fibroglandular density. FINDINGS: The mass in the medial left breast is no longer visualized mammographically. No suspicious masses, calcifications, or distortion are identified in either breast. On physical exam, no suspicious lumps are identified. Targeted ultrasound is performed, showing near complete resolution of the previously identified probably benign mass. Only a wisp of increased echogenicity remains in the superficial subcutaneous fat in the left breast at 10 o'clock, 9 cm from the nipple. IMPRESSION:  Near complete resolution of the probably benign left breast mass with only minimal visualization with ultrasound. The finding is now considered benign. No other evidence of malignancy. RECOMMENDATION: Annual screening mammography. I have discussed the findings  and recommendations with the patient. If applicable, a reminder letter will be sent to the patient regarding the next appointment. BI-RADS CATEGORY  2: Benign. Electronically Signed   By: Dorise Bullion III M.D.   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 08/23/2016   Prediabetes 12/03/2015   OSA on CPAP 02/19/2015   Lung field abnormal 11/11/2014   Morbid obesity ([place]) 10/09/2014   Constipation 06/14/2014   Peripheral venous insufficiency 05/25/2013   Pulmonary HTN ([place]) 05/12/2013   Bilateral lower extremity edema 05/12/2013   Morbid obesity with BMI of 50.0-59.9, adult ([place])    Shoulder joint pain 03/28/2013   Abnormal glucose level 02/20/2013   Alopecia 02/20/2013   Degeneration of lumbar intervertebral disc 02/20/2013   Hyperglycemia due to type 2 diabetes mellitus ([place]) 02/20/2013   Type 2 diabetes mellitus with other specified complication ([place]) 44/31/5400   Back pain, hx of prior back surgery 12/09/2011   Pancreatitis, mild, CT negative 12/08/2011   Fatty liver 12/08/2011   Substernal chest pain, suspected secondary to acute pancreatitis 12/06/2011   Hypertension 12/06/2011   Hypokalemia 12/06/2011   Hypothyroidism 12/06/2011   Anxiety 12/06/2011   BV (bacterial vaginosis)    Hyperlipidemia    Past Medical History:  Diagnosis Date   Anemia when she was young   Arthritis    Congestion of nasal sinus    Constipation    takes Colace every other day   DDD (degenerative disc disease), lumbar    Diabetes mellitus without  complication ([facility])    per pt had been diagnosed but then "number started going down so my doctor said i didnt have diabetes"   Dry eyes    uses Systane Eye drops daily as needed   GERD (gastroesophageal reflux disease) 12/06/2011   takes Nexium daily   Headache(784.0)    occasionally-d/t congestion   Heart murmur    History of bronchitis 6-7 yrs ago   Hyperlipidemia    Hypertension    takes Metoprolol and Diovan daily   Hypothyroidism 12/06/2011   takes Synthroid daily   Joint pain    Multiple allergies    takes Zyrtec daily;uses Nasonex daily as needed   Nocturia    Numbness    weakness-right hand   OSA on CPAP    Pancreatitis    Peripheral edema    takes Furosemide daily   Shingles    Urinary frequency    Venous insufficiency of leg     Family History  Problem Relation Age of Onset   Hypertension Mother    Hypertension Father    Hypertension Sister    Kidney disease Sister        KIDNEL TRANSPLANT   Thyroid disease Sister    Thyroid disease Sister    Hypertension Sister    Breast cancer Maternal Aunt        >  34; passed away from it   Breast cancer Maternal Aunt        >50    Past Surgical History:  Procedure Laterality Date   ABDOMINAL HYSTERECTOMY     uterine prolapse   CARDIAC SURGERY     CHOLECYSTECTOMY     COLONOSCOPY     ESOPHAGOGASTRODUODENOSCOPY  5-20yrs ago   FOOT SURGERY Bilateral    KNEE ARTHROSCOPY     LAPAROSCOPIC GASTRIC SLEEVE RESECTION N/A 08/23/2016   Procedure: LAPAROSCOPIC GASTRIC SLEEVE RESECTION WITH UPPER ENDO;  Surgeon: Johnathan Hausen, MD;  Location[facility];  Service: General;  Laterality: N/A;   POSTERIOR LUMBAR FUSION  03/10/2009   L3-4 and L3-4 fusion, Dr. Leeroy Cha   SHOULDER ARTHROSCOPY WITH ROTATOR CUFF REPAIR AND SUBACROMIAL DECOMPRESSION Right 08/23/2013   Procedure: RIGHT SHOULDER ARTHROSCOPY WITH  SUBACROMIAL DECOMPRESSION DISTAL CLAVICLE RESECTION AND  ROTATOR CUFF REPAIR ;  Surgeon: Marin Shutter, MD;  Location: [facility];   Service: Orthopedics;  Laterality: Right;   TUBAL LIGATION     Social History   Occupational History    Employer: [employer]    Comment: [employer]  Tobacco Use   Smoking status: Former    Packs/day: 0.75    Years: 40.00    Pack years: 30.00    Types: Cigarettes    Quit date: 1993    Years since quitting: 30.0   Smokeless tobacco: Never   Tobacco comments:    quit smoking in 1993  Vaping Use   Vaping Use: Never used  Substance and Sexual Activity   Alcohol use: Yes    Alcohol/week: 0.0 standard drinks    Comment: rarely;; mixed drink    Drug use: No   Sexual activity: Yes    Partners: Male    Birth control/protection: Surgical    Comment: Hysterectomy

## 2021-02-13 ENCOUNTER — Other Ambulatory Visit (HOSPITAL_COMMUNITY): Payer: Self-pay

## 2021-02-13 DIAGNOSIS — E1165 Type 2 diabetes mellitus with hyperglycemia: Secondary | ICD-10-CM | POA: Diagnosis not present

## 2021-02-13 DIAGNOSIS — M1990 Unspecified osteoarthritis, unspecified site: Secondary | ICD-10-CM | POA: Diagnosis not present

## 2021-02-13 DIAGNOSIS — E1169 Type 2 diabetes mellitus with other specified complication: Secondary | ICD-10-CM | POA: Diagnosis not present

## 2021-02-13 DIAGNOSIS — E785 Hyperlipidemia, unspecified: Secondary | ICD-10-CM | POA: Diagnosis not present

## 2021-02-13 DIAGNOSIS — I10 Essential (primary) hypertension: Secondary | ICD-10-CM | POA: Diagnosis not present

## 2021-02-16 ENCOUNTER — Other Ambulatory Visit (HOSPITAL_COMMUNITY): Payer: Self-pay

## 2021-02-17 DIAGNOSIS — H25811 Combined forms of age-related cataract, right eye: Secondary | ICD-10-CM | POA: Diagnosis not present

## 2021-02-17 DIAGNOSIS — H25812 Combined forms of age-related cataract, left eye: Secondary | ICD-10-CM | POA: Diagnosis not present

## 2021-02-24 ENCOUNTER — Ambulatory Visit (INDEPENDENT_AMBULATORY_CARE_PROVIDER_SITE_OTHER): Payer: 59 | Admitting: Orthopaedic Surgery

## 2021-02-24 ENCOUNTER — Other Ambulatory Visit: Payer: Self-pay

## 2021-02-24 ENCOUNTER — Encounter: Payer: Self-pay | Admitting: Orthopaedic Surgery

## 2021-02-24 VITALS — BP 137/86 | HR 76 | Ht 63.0 in | Wt 247.6 lb

## 2021-02-24 DIAGNOSIS — M170 Bilateral primary osteoarthritis of knee: Secondary | ICD-10-CM

## 2021-02-24 NOTE — Progress Notes (Signed)
Office Visit Note   Patient: [name]           Date of Birth: Apr 24, 1960           MRN: [id number] Visit Date: 02/24/2021              Requested by: Velna Hatchet, MD [address] [place],  [place] [address] PCP: Velna Hatchet, MD   Assessment & Plan: Visit Diagnoses:  1. Bilateral primary osteoarthritis of knee     Plan: Patient states she is starting some medication usually used for diabetes which may help her weight loss through her PCP.  We discussed [facility] as an option but it sounds like she and her PCP have outlined a plan.  Current weight 247 and her close and goal weight is 225 to get her BMI below 40.  Her A1c is less than 7.  She states she would like to try gel injections to see if this helps so she can continue to exercise and lose weight to reach her goal.  Follow-Up Instructions: No follow-ups on file.   Orders:  No orders of the defined types were placed in this encounter.  No orders of the defined types were placed in this encounter.     Procedures: No procedures performed   Clinical Data: No additional findings.   Subjective: Chief Complaint  Patient presents with   Right Knee - Follow-up    HPI 60 year old female seen with bilateral knee osteoarthritis worse on the right the left knee with bone-on-bone changes.  She has been trying to work on losing weight used to be over 300 pounds had bariatric surgery and is now down to 247.  She has difficulty ambulating has had cortisone injections without relief taken anti-inflammatories in the past before surgery but is limited and unable to take anti-inflammatories post bariatric surgery.  She is used topical rubs and Tylenol not currently using a cane.  Review of Systems Positive for history of bariatric surgery prediabetes pulmonary hypertension.  Previous problems with sleep apnea improved with weight loss.  Positive for weight loss post laparoscopic sleeve gastrectomy July 2018.   All other systems noncontributory in HPI.  Objective: Vital Signs: BP 137/86 (BP Location: Right Arm, Patient Position: Sitting, Cuff Size: Large)    Pulse 76    Ht 5\' 3"  (1.6 m)    Wt 247 lb 9.6 oz (112.3 kg)    SpO2 97%    BMI 43.86 kg/m   Physical Exam Constitutional:      Appearance: She is well-developed.  HENT:     Head: Normocephalic.     Right Ear: External ear normal.     Left Ear: External ear normal. There is no impacted cerumen.  Eyes:     Pupils: Pupils are equal, round, and reactive to light.  Neck:     Thyroid: No thyromegaly.     Trachea: No tracheal deviation.  Cardiovascular:     Rate and Rhythm: Normal rate.  Pulmonary:     Effort: Pulmonary effort is normal.  Abdominal:     Palpations: Abdomen is soft.  Musculoskeletal:     Cervical back: No rigidity.  Skin:    General: Skin is warm and dry.  Neurological:     Mental Status: She is alert and oriented to person, place, and time.  Psychiatric:        Behavior: Behavior normal.    Ortho Exam bilateral knee crepitus collateral joints are stable.  Negative  logroll of the hips.  She is amatory with a bilateral knee limp.  Specialty Comments:  No specialty comments available.  Imaging: No results found.   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 08/23/2016   Prediabetes 12/03/2015   OSA on CPAP 02/19/2015   Lung field abnormal 11/11/2014   Morbid obesity ([facility]) 10/09/2014   Constipation 06/14/2014   Peripheral venous insufficiency 05/25/2013   Pulmonary HTN ([facility]) 05/12/2013    Bilateral lower extremity edema 05/12/2013   Morbid obesity with BMI of 50.0-59.9, adult ([facility])    Shoulder joint pain 03/28/2013   Abnormal glucose level 02/20/2013   Alopecia 02/20/2013   Degeneration of lumbar intervertebral disc 02/20/2013   Hyperglycemia due to type 2 diabetes mellitus ([place]) 02/20/2013   Type 2 diabetes mellitus with other specified complication ([facility]) 24/09/7351   Back pain, hx of prior back surgery 12/09/2011   Pancreatitis, mild, CT negative 12/08/2011   Fatty liver 12/08/2011   Substernal chest pain, suspected secondary to acute pancreatitis 12/06/2011   Hypertension 12/06/2011   Hypokalemia 12/06/2011   Hypothyroidism 12/06/2011   Anxiety 12/06/2011   BV (bacterial vaginosis)    Hyperlipidemia    Past Medical History:  Diagnosis Date   Anemia when she was young   Arthritis    Congestion of nasal sinus    Constipation    takes Colace every other day   DDD (degenerative disc disease), lumbar    Diabetes mellitus without complication ([facility])    per pt had been diagnosed but then "number started going down so my doctor said i didnt have diabetes"   Dry eyes    uses Systane Eye drops daily as needed   GERD (gastroesophageal reflux disease) 12/06/2011   takes Nexium daily   Headache(784.0)    occasionally-d/t congestion   Heart murmur    History of bronchitis 6-7 yrs ago   Hyperlipidemia    Hypertension    takes Metoprolol and Diovan daily   Hypothyroidism 12/06/2011   takes Synthroid daily   Joint pain    Multiple allergies    takes Zyrtec daily;uses Nasonex daily as needed   Nocturia    Numbness    weakness-right hand   OSA on CPAP    Pancreatitis    Peripheral edema    takes Furosemide daily   Shingles    Urinary frequency    Venous insufficiency of leg     Family History  Problem Relation Age of Onset   Hypertension Mother    Hypertension Father    Hypertension Sister    Kidney disease Sister        KIDNEL TRANSPLANT   Thyroid  disease Sister    Thyroid disease Sister    Hypertension Sister    Breast cancer Maternal Aunt        >50; passed away from it   Breast cancer Maternal Aunt        >50    Past Surgical History:  Procedure Laterality Date   ABDOMINAL HYSTERECTOMY     uterine prolapse   CARDIAC SURGERY     CHOLECYSTECTOMY  COLONOSCOPY     ESOPHAGOGASTRODUODENOSCOPY  5-71yrs ago   FOOT SURGERY Bilateral    KNEE ARTHROSCOPY     LAPAROSCOPIC GASTRIC SLEEVE RESECTION N/A 08/23/2016   Procedure: LAPAROSCOPIC GASTRIC SLEEVE RESECTION WITH UPPER ENDO;  Surgeon: Johnathan Hausen, MD;  [facility];  Service: General;  Laterality: N/A;   POSTERIOR LUMBAR FUSION  03/10/2009   L3-4 and L3-4 fusion, Dr. Leeroy Cha   SHOULDER ARTHROSCOPY WITH ROTATOR CUFF REPAIR AND SUBACROMIAL DECOMPRESSION Right 08/23/2013   Procedure: RIGHT SHOULDER ARTHROSCOPY WITH  SUBACROMIAL DECOMPRESSION DISTAL CLAVICLE RESECTION AND  ROTATOR CUFF REPAIR ;  Surgeon: Marin Shutter, MD;  Location: [facility];  Service: Orthopedics;  Laterality: Right;   TUBAL LIGATION     Social History   Occupational History    Employer: [employer]    Comment: [employer]  Tobacco Use   Smoking status: Former    Packs/day: 0.75    Years: 40.00    Pack years: 30.00    Types: Cigarettes    Quit date: 1993    Years since quitting: 30.1   Smokeless tobacco: Never   Tobacco comments:    quit smoking in 1993  Vaping Use   Vaping Use: Never used  Substance and Sexual Activity   Alcohol use: Yes    Alcohol/week: 0.0 standard drinks    Comment: rarely;; mixed drink    Drug use: No   Sexual activity: Yes    Partners: Male    Birth control/protection: Surgical    Comment: Hysterectomy

## 2021-02-25 ENCOUNTER — Telehealth: Payer: Self-pay

## 2021-02-25 ENCOUNTER — Other Ambulatory Visit (HOSPITAL_COMMUNITY): Payer: Self-pay

## 2021-02-25 NOTE — Telephone Encounter (Signed)
Noted  

## 2021-02-25 NOTE — Telephone Encounter (Signed)
-----   Message from Melton Alar, [place] sent at 02/24/2021  4:43 PM EST ----- Regarding: Gel Injection Dr. Lorin Mercy would like to get gel approval for the left and right knee.

## 2021-02-26 ENCOUNTER — Other Ambulatory Visit (HOSPITAL_COMMUNITY): Payer: Self-pay

## 2021-02-26 MED ORDER — PREDNISOLONE ACETATE 1 % OP SUSP
OPHTHALMIC | 0 refills | Status: DC
  Filled 2021-02-26: qty 5, 28d supply, fill #0

## 2021-02-26 MED ORDER — OFLOXACIN 0.3 % OP SOLN
OPHTHALMIC | 0 refills | Status: DC
  Filled 2021-02-26: qty 5, 28d supply, fill #0

## 2021-02-28 ENCOUNTER — Other Ambulatory Visit (HOSPITAL_COMMUNITY): Payer: Self-pay

## 2021-03-05 DIAGNOSIS — H25812 Combined forms of age-related cataract, left eye: Secondary | ICD-10-CM | POA: Diagnosis not present

## 2021-03-05 DIAGNOSIS — H2513 Age-related nuclear cataract, bilateral: Secondary | ICD-10-CM | POA: Diagnosis not present

## 2021-03-05 DIAGNOSIS — Z01818 Encounter for other preprocedural examination: Secondary | ICD-10-CM | POA: Diagnosis not present

## 2021-03-05 DIAGNOSIS — H25811 Combined forms of age-related cataract, right eye: Secondary | ICD-10-CM | POA: Diagnosis not present

## 2021-03-05 DIAGNOSIS — H2511 Age-related nuclear cataract, right eye: Secondary | ICD-10-CM | POA: Diagnosis not present

## 2021-03-08 DIAGNOSIS — G4733 Obstructive sleep apnea (adult) (pediatric): Secondary | ICD-10-CM | POA: Diagnosis not present

## 2021-03-12 ENCOUNTER — Telehealth: Payer: Self-pay | Admitting: Orthopaedic Surgery

## 2021-03-12 ENCOUNTER — Other Ambulatory Visit (HOSPITAL_COMMUNITY): Payer: Self-pay

## 2021-03-12 DIAGNOSIS — H25812 Combined forms of age-related cataract, left eye: Secondary | ICD-10-CM | POA: Diagnosis not present

## 2021-03-12 MED ORDER — OFLOXACIN 0.3 % OP SOLN
OPHTHALMIC | 0 refills | Status: DC
  Filled 2021-03-12: qty 5, 25d supply, fill #0
  Filled 2021-03-20: qty 5, 14d supply, fill #0

## 2021-03-12 MED ORDER — PREDNISOLONE ACETATE 1 % OP SUSP
OPHTHALMIC | 0 refills | Status: DC
  Filled 2021-03-12: qty 5, 30d supply, fill #0
  Filled 2021-03-20: qty 5, 14d supply, fill #0

## 2021-03-12 NOTE — Telephone Encounter (Signed)
Patient called. Would like to know the status of her gel injections,. Her call back number is [phone]

## 2021-03-13 ENCOUNTER — Telehealth: Payer: Self-pay

## 2021-03-13 NOTE — Telephone Encounter (Signed)
VOB submitted for Euflexxa, bilateral knee. BV pending.

## 2021-03-13 NOTE — Telephone Encounter (Signed)
Talked with patient concerning gel injection.  

## 2021-03-17 DIAGNOSIS — H2513 Age-related nuclear cataract, bilateral: Secondary | ICD-10-CM | POA: Diagnosis not present

## 2021-03-18 ENCOUNTER — Other Ambulatory Visit (HOSPITAL_COMMUNITY): Payer: Self-pay

## 2021-03-18 ENCOUNTER — Telehealth: Payer: Self-pay

## 2021-03-18 NOTE — Telephone Encounter (Signed)
Approved for Euflexxa, bilateral knee. °Buy & Bill °Deductible has been met °Covered at 100% of the allowable amount °Co-pay of $50.00 per visit °No PA required ° °Appt. 03/25/2021 with James Owens °

## 2021-03-19 ENCOUNTER — Other Ambulatory Visit (HOSPITAL_COMMUNITY): Payer: Self-pay

## 2021-03-19 ENCOUNTER — Other Ambulatory Visit: Payer: Self-pay | Admitting: Orthopaedic Surgery

## 2021-03-19 ENCOUNTER — Telehealth: Payer: Self-pay | Admitting: Orthopaedic Surgery

## 2021-03-19 MED ORDER — BACLOFEN 10 MG PO TABS
ORAL_TABLET | ORAL | 1 refills | Status: DC
  Filled 2021-03-19: qty 60, 20d supply, fill #0
  Filled 2021-05-30: qty 60, 20d supply, fill #1

## 2021-03-19 NOTE — Telephone Encounter (Signed)
Patient called. She would like Dr. Lorin Mercy to call in some muscle relaxers for  her. Her call back number is [phone]

## 2021-03-19 NOTE — Telephone Encounter (Signed)
Please advise 

## 2021-03-20 ENCOUNTER — Other Ambulatory Visit (HOSPITAL_COMMUNITY): Payer: Self-pay

## 2021-03-20 NOTE — Telephone Encounter (Signed)
FYI-Pt called wanting to inform provider that she has received her new machine and the pressure on this one is much better than the last one.

## 2021-03-20 NOTE — Telephone Encounter (Signed)
I left voicemail for patient advising. 

## 2021-03-23 NOTE — Telephone Encounter (Signed)
Noted, glad to hear. 

## 2021-03-25 ENCOUNTER — Other Ambulatory Visit: Payer: Self-pay

## 2021-03-25 ENCOUNTER — Ambulatory Visit (INDEPENDENT_AMBULATORY_CARE_PROVIDER_SITE_OTHER): Payer: 59 | Admitting: Surgery

## 2021-03-25 ENCOUNTER — Encounter: Payer: Self-pay | Admitting: Surgery

## 2021-03-25 DIAGNOSIS — M1712 Unilateral primary osteoarthritis, left knee: Secondary | ICD-10-CM

## 2021-03-25 DIAGNOSIS — M1711 Unilateral primary osteoarthritis, right knee: Secondary | ICD-10-CM

## 2021-03-25 MED ORDER — SODIUM HYALURONATE (VISCOSUP) 20 MG/2ML IX SOSY
20.0000 mg | PREFILLED_SYRINGE | INTRA_ARTICULAR | Status: AC | PRN
  Administered 2021-03-25: 20 mg via INTRA_ARTICULAR

## 2021-03-25 MED ORDER — LIDOCAINE HCL 1 % IJ SOLN
3.0000 mL | INTRAMUSCULAR | Status: AC | PRN
  Administered 2021-03-25: 3 mL

## 2021-03-25 NOTE — Progress Notes (Signed)
? ?Office Visit Note ?  ?Patient: [name]           ?Date of Birth: [birth year]           ?MRN: [id number] ?Visit Date: 03/25/2021 ?             ?Requested by: Velna Hatchet, MD ?[address] ?[place],  [place] [address] ?PCP: Velna Hatchet, MD ? ? ?Assessment & Plan: ?Visit Diagnoses:  ?1. Unilateral primary osteoarthritis, left knee   ?2. Unilateral primary osteoarthritis, right knee   ? ? ?Plan: After patient consent bilateral knees were prepped with Betadine and intra-articular Euflexxa injections 1 of 3 performed.  Tolerated without complication.  Patient will follow-up in 1 week for injections #2. ? ?Follow-Up Instructions: Return in about 1 week (around 04/01/2021) for With Jeneen Rinks for Visco injection #2 bilateral knees.  ? ?Orders:  ?No orders of the defined types were placed in this encounter. ? ?No orders of the defined types were placed in this encounter. ? ? ? ? Procedures: ?Large Joint Inj: bilateral knee on 03/25/2021 11:32 AM ?Indications: pain ?Details: 25 G 1.5 in needle, anteromedial approach ?Medications (Right): 3 mL lidocaine 1 %; 20 mg Sodium Hyaluronate 20 MG/2ML ?Consent was given by the patient. Patient was prepped and draped in the usual sterile fashion.  ? ? ? ? ?Clinical Data: ?No additional findings. ? ? ?Subjective: ?Chief Complaint  ?Patient presents with  ? Right Knee - Pain  ? Left Knee - Pain  ? ? ?HPI ?61 year old black female history of end-stage DJD bilateral knees comes in to start Visco series.  She did see Dr. Lorin Mercy to discuss surgical options.  He would like her to get BMI down.  She wants to proceed with injections today. ?Review of Systems ?No current cardiopulmonary GI/GU issues ? ?Objective: ?Vital Signs: There were no vitals taken for this visit. ? ?Physical Exam ?Pleasant female alert and oriented in no acute distress.  Gait is antalgic.  No respiratory distress.  Knees positive crepitus.  Joint line is tender.  She does have some bilateral knee swelling.  No  significant effusions. ?Ortho Exam ? ?Specialty Comments:  ?No specialty comments available. ? ?Imaging: ?No results found. ? ? ?PMFS History: ?Patient Active Problem List  ? Diagnosis Date Noted  ? Memory change 12/25/2020  ? Chronic pain syndrome 05/04/2019  ? Raynaud's disease 04/13/2019  ? Lumbar radiculopathy 12/18/2018  ? Spinal stenosis of lumbar region with neurogenic claudication 12/18/2018  ? Post laminectomy syndrome 12/18/2018  ? Bilateral primary osteoarthritis of knee 12/27/2016  ? History of excision of intestinal structure 09/07/2016  ? 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 Prediabetes 12/03/2015  ? OSA on CPAP 02/19/2015  ? Lung field abnormal 11/11/2014  ? Morbid obesity ([facility]) 10/09/2014  ? Constipation 06/14/2014  ? Peripheral venous insufficiency 05/25/2013  ? Pulmonary HTN ([facility]) 05/12/2013  ? Bilateral lower extremity edema 05/12/2013  ? Morbid obesity with BMI of 50.0-59.9, adult ([facility] [place])   ? Shoulder joint pain 03/28/2013  ? Abnormal glucose level 02/20/2013  ? Alopecia 02/20/2013  ? Degeneration of lumbar intervertebral disc 02/20/2013  ? Hyperglycemia due to type 2 diabetes mellitus ([facility]) 02/20/2013  ? Type 2 diabetes mellitus with other specified complication ([place]) 48/18/5631  ? Back pain, hx of prior back surgery 12/09/2011  ? Pancreatitis, mild, CT negative 12/08/2011  ? Fatty liver 12/08/2011  ? Substernal chest pain, suspected secondary to acute pancreatitis 12/06/2011  ? Hypertension 12/06/2011  ? Hypokalemia 12/06/2011  ?  Hypothyroidism 12/06/2011  ? Anxiety 12/06/2011  ? BV (bacterial vaginosis)   ? Hyperlipidemia   ? ?Past Medical History:  ?Diagnosis Date  ? Anemia when she was young  ? Arthritis   ? Congestion of nasal sinus   ? Constipation   ? takes Colace every other day  ? DDD (degenerative disc disease), lumbar   ? Diabetes mellitus without complication ([facility] [facility])   ? per pt had been diagnosed but then "number started going down so my doctor said i didnt have  diabetes"  ? Dry eyes   ? uses Systane Eye drops daily as needed  ? GERD (gastroesophageal reflux disease) 12/06/2011  ? takes Nexium daily  ? Headache(784.0)   ? occasionally-d/t congestion  ? Heart murmur   ? History of bronchitis 6-7 yrs ago  ? Hyperlipidemia   ? Hypertension   ? takes Metoprolol and Diovan daily  ? Hypothyroidism 12/06/2011  ? takes Synthroid daily  ? Joint pain   ? Multiple allergies   ? takes Zyrtec daily;uses Nasonex daily as needed  ? Nocturia   ? Numbness   ? weakness-right hand  ? OSA on CPAP   ? Pancreatitis   ? Peripheral edema   ? takes Furosemide daily  ? Shingles   ? Urinary frequency   ? Venous insufficiency of leg   ?  ?Family History  ?Problem Relation Age of Onset  ? Hypertension Mother   ? Hypertension Father   ? Hypertension Sister   ? Kidney disease Sister   ?     KIDNEL TRANSPLANT  ? Thyroid disease Sister   ? Thyroid disease Sister   ? Hypertension Sister   ? Breast cancer Maternal Aunt   ?     >50; passed away from it  ? Breast cancer Maternal Aunt   ?     >50  ?  ?Past Surgical History:  ?Procedure Laterality Date  ? ABDOMINAL HYSTERECTOMY    ? uterine prolapse  ? CARDIAC SURGERY    ? CHOLECYSTECTOMY    ? COLONOSCOPY    ? ESOPHAGOGASTRODUODENOSCOPY  5-49yrs ago  ? FOOT SURGERY Bilateral   ? KNEE ARTHROSCOPY    ? LAPAROSCOPIC GASTRIC SLEEVE RESECTION N/A 08/23/2016  ? Procedure: LAPAROSCOPIC GASTRIC SLEEVE RESECTION WITH UPPER ENDO;  Surgeon: Johnathan Hausen, MD;  [facility];  Service: General;  Laterality: N/A;  ? POSTERIOR LUMBAR FUSION  03/10/2009  ? L3-4 and L3-4 fusion, Dr. Leeroy Cha  ? SHOULDER ARTHROSCOPY WITH ROTATOR CUFF REPAIR AND SUBACROMIAL DECOMPRESSION Right 08/23/2013  ? Procedure: RIGHT SHOULDER ARTHROSCOPY WITH  SUBACROMIAL DECOMPRESSION DISTAL CLAVICLE RESECTION AND  ROTATOR CUFF REPAIR ;  Surgeon: Marin Shutter, MD;  Location: [facility];  Service: Orthopedics;  Laterality: Right;  ? TUBAL LIGATION    ? ?Social History  ? ?Occupational History  ?   Employer: [employer]  ?  Comment: [employer]  ?Tobacco Use  ? Smoking status: Former  ?  Packs/day: 0.75  ?  Years: 40.00  ?  Pack years: 30.00  ?  Types: Cigarettes  ?  Quit date: 65  ?  Years since quitting: 30.1  ? Smokeless tobacco: Never  ? Tobacco comments:  ?  quit smoking in 1993  ?Vaping Use  ? Vaping Use: Never used  ?Substance and Sexual Activity  ? Alcohol use: Yes  ?  Alcohol/week: 0.0 standard drinks  ?  Comment: rarely;; mixed drink   ? Drug use: No  ? Sexual activity: Yes  ?  Partners: Male  ?  Birth control/protection: Surgical  ?  Comment: Hysterectomy  ? ? ? ? ? ? ?

## 2021-03-29 ENCOUNTER — Other Ambulatory Visit (HOSPITAL_COMMUNITY): Payer: Self-pay

## 2021-03-30 ENCOUNTER — Other Ambulatory Visit (HOSPITAL_COMMUNITY): Payer: Self-pay

## 2021-03-31 DIAGNOSIS — G4733 Obstructive sleep apnea (adult) (pediatric): Secondary | ICD-10-CM | POA: Diagnosis not present

## 2021-04-02 ENCOUNTER — Ambulatory Visit: Payer: 59 | Admitting: Physician Assistant

## 2021-04-02 ENCOUNTER — Other Ambulatory Visit: Payer: Self-pay

## 2021-04-02 ENCOUNTER — Encounter: Payer: Self-pay | Admitting: Physician Assistant

## 2021-04-02 DIAGNOSIS — M170 Bilateral primary osteoarthritis of knee: Secondary | ICD-10-CM | POA: Diagnosis not present

## 2021-04-02 DIAGNOSIS — M1712 Unilateral primary osteoarthritis, left knee: Secondary | ICD-10-CM

## 2021-04-02 MED ORDER — SODIUM HYALURONATE (VISCOSUP) 20 MG/2ML IX SOSY
20.0000 mg | PREFILLED_SYRINGE | INTRA_ARTICULAR | Status: AC | PRN
  Administered 2021-04-02: 09:00:00 20 mg via INTRA_ARTICULAR

## 2021-04-02 NOTE — Progress Notes (Signed)
? ?Office Visit Note ?  ?Patient: [name]           ?Date of Birth: Nov 14, 1960           ?MRN: [id number] ?Visit Date: 04/02/2021 ?             ?Requested by: Velna Hatchet, MD ?[address] ?[place],  [place] [address] ?PCP: Velna Hatchet, MD ? ?Chief Complaint  ?Patient presents with  ?? Right Knee - Follow-up  ?? Left Knee - Follow-up  ? ? ? ? ?HPI: ?Patient is a pleasant 61 year old woman with a history of bilateral knee arthritis right worse than left.  Here today for second in series of viscosupplementation Euflexxa injections she says she did not really feel the difference after the first injections but did not have any difficulty with them ? ?Assessment & Plan: ?Visit Diagnoses: No diagnosis found. ? ?Plan: Patient was injected today without difficulty she will follow-up in 1 week for her final injections ? ?Follow-Up Instructions: No follow-ups on file.  ? ?Ortho Exam ? ?Patient is alert, oriented, no adenopathy, well-dressed, normal affect, normal respiratory effort. ?Bilateral knees no effusion no redness ? ?Imaging: ?No results found. ?No images are attached to the encounter. ? ?Labs: ?Lab Results  ?Component Value Date  ? HGBA1C 6.0 (H) 08/07/2020  ? REPTSTATUS 06/24/2013 FINAL 06/22/2013  ? CULT  06/22/2013  ?  Multiple bacterial morphotypes present, none predominant. Suggest appropriate recollection if clinically indicated. ?Performed at [facility]  ? ? ? ?Lab Results  ?Component Value Date  ? ALBUMIN 4.3 08/06/2020  ? ALBUMIN 4.1 08/18/2016  ? ALBUMIN 3.6 11/09/2014  ? ? ?Lab Results  ?Component Value Date  ? MG 2.2 12/06/2011  ? ?No results found for: VD25OH ? ?No results found for: PREALBUMIN ?CBC EXTENDED Latest Ref Rng & Units 08/06/2020 09/17/2017 08/25/2016  ?WBC 3.4 - 10.8 x10E3/uL 6.9 - 10.0  ?RBC 3.77 - 5.28 x10E6/uL 4.89 - 4.28  ?HGB 11.1 - 15.9 g/dL 13.1 15.0 11.5(L)  ?HCT 34.0 - 46.6 % 41.0 44.0 35.5(L)  ?PLT 150 - 450 x10E3/uL 344 - 279  ?NEUTROABS 1.7 - 7.7 K/uL - - 6.5   ?LYMPHSABS 0.7 - 4.0 K/uL - - 2.2  ? ? ? ?There is no height or weight on file to calculate BMI. ? ?Orders:  ?No orders of the defined types were placed in this encounter. ? ?No orders of the defined types were placed in this encounter. ? ? ? Procedures: ?Large Joint Inj: bilateral knee on 04/02/2021 8:37 AM ?Indications: pain and diagnostic evaluation ?Details: 25 G 1.5 in needle, anteromedial approach ? ?Arthrogram: No ? ?Medications (Right): 20 mg Sodium Hyaluronate 20 MG/2ML ?Medications (Left): 20 mg Sodium Hyaluronate 20 MG/2ML ?Outcome: tolerated well, no immediate complications ?Procedure, treatment alternatives, risks and benefits explained, specific risks discussed. Consent was given by the patient.  ? ? ? ?Clinical Data: ?No additional findings. ? ?ROS: ? ?All other systems negative, except as noted in the HPI. ?Review of Systems ? ?Objective: ?Vital Signs: There were no vitals taken for this visit. ? ?Specialty Comments:  ?No specialty comments available. ? ?PMFS History: ?Patient Active Problem List  ? Diagnosis Date Noted  ?? Memory change 12/25/2020  ?? Chronic pain syndrome 05/04/2019  ?? Raynaud's disease 04/13/2019  ?? Lumbar radiculopathy 12/18/2018  ?? Spinal stenosis of lumbar region with neurogenic claudication 12/18/2018  ?? Post laminectomy syndrome 12/18/2018  ?? Bilateral primary osteoarthritis of knee 12/27/2016  ?? History of excision of intestinal  structure 09/07/2016  ?? 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 Prediabetes 12/03/2015  ?? OSA on CPAP 02/19/2015  ?? Lung field abnormal 11/11/2014  ?? Morbid obesity ([place]) 10/09/2014  ?? Constipation 06/14/2014  ?? Peripheral venous insufficiency 05/25/2013  ?? Pulmonary HTN ([place]) 05/12/2013  ?? Bilateral lower extremity edema 05/12/2013  ?? Morbid obesity with BMI of 50.0-59.9, adult ([place])   ?? Shoulder joint pain 03/28/2013  ?? Abnormal glucose level 02/20/2013  ?? Alopecia 02/20/2013  ?? Degeneration of lumbar  intervertebral disc 02/20/2013  ?? Hyperglycemia due to type 2 diabetes mellitus ([place]) 02/20/2013  ?? Type 2 diabetes mellitus with other specified complication ([place]) 62/83/1517  ?? Back pain, hx of prior back surgery 12/09/2011  ?? Pancreatitis, mild, CT negative 12/08/2011  ?? Fatty liver 12/08/2011  ?? Substernal chest pain, suspected secondary to acute pancreatitis 12/06/2011  ?? Hypertension 12/06/2011  ?? Hypokalemia 12/06/2011  ?? Hypothyroidism 12/06/2011  ?? Anxiety 12/06/2011  ?? BV (bacterial vaginosis)   ?? Hyperlipidemia   ? ?Past Medical History:  ?Diagnosis Date  ?? Anemia when she was young  ?? Arthritis   ?? Congestion of nasal sinus   ?? Constipation   ? takes Colace every other day  ?? DDD (degenerative disc disease), lumbar   ?? Diabetes mellitus without complication ([place])   ? per pt had been diagnosed but then "number started going down so my doctor said i didnt have diabetes"  ?? Dry eyes   ? uses Systane Eye drops daily as needed  ?? GERD (gastroesophageal reflux disease) 12/06/2011  ? takes Nexium daily  ?? Headache(784.0)   ? occasionally-d/t congestion  ?? Heart murmur   ?? History of bronchitis 6-7 yrs ago  ?? Hyperlipidemia   ?? Hypertension   ? takes Metoprolol and Diovan daily  ?? Hypothyroidism 12/06/2011  ? takes Synthroid daily  ?? Joint pain   ?? Multiple allergies   ? takes Zyrtec daily;uses Nasonex daily as needed  ?? Nocturia   ?? Numbness   ? weakness-right hand  ?? OSA on CPAP   ?? Pancreatitis   ?? Peripheral edema   ? takes Furosemide daily  ?? Shingles   ?? Urinary frequency   ?? Venous insufficiency of leg   ?  ?Family History  ?Problem Relation Age of Onset  ?? Hypertension Mother   ?? Hypertension Father   ?? Hypertension Sister   ?? Kidney disease Sister   ?     KIDNEL TRANSPLANT  ?? Thyroid disease Sister   ?? Thyroid disease Sister   ?? Hypertension Sister   ?? Breast cancer Maternal Aunt   ?     >50; passed away from it  ?? Breast cancer Maternal Aunt   ?     >50  ?   ?Past Surgical History:  ?Procedure Laterality Date  ?? ABDOMINAL HYSTERECTOMY    ? uterine prolapse  ?? CARDIAC SURGERY    ?? CHOLECYSTECTOMY    ?? COLONOSCOPY    ?? ESOPHAGOGASTRODUODENOSCOPY  5-52yr ago  ?? FOOT SURGERY Bilateral   ?? KNEE ARTHROSCOPY    ?? LAPAROSCOPIC GASTRIC SLEEVE RESECTION N/A 08/23/2016  ? Procedure: LAPAROSCOPIC GASTRIC SLEEVE RESECTION WITH UPPER ENDO;  Surgeon: MJohnathan Hausen MD;  Location: [facility];  Service: General;  Laterality: N/A;  ?? POSTERIOR LUMBAR FUSION  03/10/2009  ? L3-4 and L3-4 fusion, Dr. ELeeroy Cha ?? SHOULDER ARTHROSCOPY WITH ROTATOR CUFF REPAIR AND SUBACROMIAL DECOMPRESSION Right 08/23/2013  ? Procedure: RIGHT SHOULDER ARTHROSCOPY WITH  SUBACROMIAL DECOMPRESSION DISTAL CLAVICLE RESECTION AND  ROTATOR CUFF REPAIR ;  Surgeon: Marin Shutter, MD;  Location: [facility];  Service: Orthopedics;  Laterality: Right;  ?? TUBAL LIGATION    ? ?Social History  ? ?Occupational History  ?  Employer: [employer]  ?  Comment: [employer]  ?Tobacco Use  ?? Smoking status: Former  ?  Packs/day: 0.75  ?  Years: 40.00  ?  Pack years: 30.00  ?  Types: Cigarettes  ?  Quit date: 37  ?  Years since quitting: 30.2  ?? Smokeless tobacco: Never  ?? Tobacco comments:  ?  quit smoking in 1993  ?Vaping Use  ?? Vaping Use: Never used  ?Substance and Sexual Activity  ?? Alcohol use: Yes  ?  Alcohol/week: 0.0 standard drinks  ?  Comment: rarely;; mixed drink   ?? Drug use: No  ?? Sexual activity: Yes  ?  Partners: Male  ?  Birth control/protection: Surgical  ?  Comment: Hysterectomy  ? ? ? ? ? ?

## 2021-04-05 DIAGNOSIS — G4733 Obstructive sleep apnea (adult) (pediatric): Secondary | ICD-10-CM | POA: Diagnosis not present

## 2021-04-07 ENCOUNTER — Other Ambulatory Visit (HOSPITAL_COMMUNITY): Payer: Self-pay

## 2021-04-07 MED ORDER — AZITHROMYCIN 250 MG PO TABS
ORAL_TABLET | ORAL | 0 refills | Status: DC
  Filled 2021-04-07: qty 6, 5d supply, fill #0

## 2021-04-09 ENCOUNTER — Ambulatory Visit: Payer: 59 | Admitting: Surgery

## 2021-04-11 ENCOUNTER — Other Ambulatory Visit (HOSPITAL_COMMUNITY): Payer: Self-pay

## 2021-04-15 ENCOUNTER — Ambulatory Visit (INDEPENDENT_AMBULATORY_CARE_PROVIDER_SITE_OTHER): Payer: 59 | Admitting: Surgery

## 2021-04-15 ENCOUNTER — Encounter: Payer: Self-pay | Admitting: Surgery

## 2021-04-15 VITALS — Ht 63.0 in | Wt 237.6 lb

## 2021-04-15 DIAGNOSIS — M170 Bilateral primary osteoarthritis of knee: Secondary | ICD-10-CM

## 2021-04-15 DIAGNOSIS — M1711 Unilateral primary osteoarthritis, right knee: Secondary | ICD-10-CM

## 2021-04-15 DIAGNOSIS — M1712 Unilateral primary osteoarthritis, left knee: Secondary | ICD-10-CM

## 2021-04-15 NOTE — Progress Notes (Signed)
? ?Office Visit Note ?  ?Patient: [name]           ?Date of Birth: [birth year]           ?MRN: [id number] ?Visit Date: 04/15/2021 ?             ?Requested by: Velna Hatchet, MD ?[address] ?[place],  [place] [address] ?PCP: Velna Hatchet, MD ? ? ?Assessment & Plan: ?Visit Diagnoses:  ?1. Bilateral primary osteoarthritis of knee   ? ? ?Plan: patient will follow up with dr Lorin Mercy in 2 months for recheck.  He can decide if he wants to discuss scheduling surgery versus continuing conservative management with injections. ? ?Follow-Up Instructions: Return in about 2 months (around 06/15/2021) for with dr yates recheck bilateral knees. discuss scheduling surgery vs repeat cortisone injection.  ? ?Orders:  ?Orders Placed This Encounter  ?Procedures  ? Large Joint Inj  ? ?No orders of the defined types were placed in this encounter. ? ? ? ? Procedures: ?Large Joint Inj: bilateral knee on 04/15/2021 9:08 AM ?Indications: pain ?Details: 25 G 1.5 in needle, anteromedial approach ?Medications (Right): 3 mL lidocaine 1 %; 20 mg Sodium Hyaluronate 20 MG/2ML ?Medications (Left): 3 mL lidocaine 1 %; 20 mg Sodium Hyaluronate 20 MG/2ML ?Outcome: tolerated well, no immediate complications ?Consent was given by the patient. Patient was prepped and draped in the usual sterile fashion.  ? ? ? ? ?Clinical Data: ?No additional findings. ? ? ?Subjective: ?Chief Complaint  ?Patient presents with  ? Right Knee - Follow-up  ? Left Knee - Follow-up  ? ? ?HPI ?61 year old black female history of bilateral knee DJD comes in for her third and final Euflexxa injections. ? ? ? ?Objective: ?Vital Signs: Ht '5\' 3"'$  (1.6 m)   Wt 247 lb 9.6 oz (112.3 kg)   BMI 43.86 kg/m?  ? ?Physical Exam ?Bilateral knee joint lines tender. ?Ortho Exam ? ?Specialty Comments:  ?No specialty comments available. ? ?Imaging: ?No results found. ? ? ?PMFS History: ?Patient Active Problem List  ? Diagnosis Date Noted  ? Memory change 12/25/2020  ? Chronic pain syndrome  05/04/2019  ? Raynaud's disease 04/13/2019  ? Lumbar radiculopathy 12/18/2018  ? Spinal stenosis of lumbar region with neurogenic claudication 12/18/2018  ? Post laminectomy syndrome 12/18/2018  ? Bilateral primary osteoarthritis of knee 12/27/2016  ? History of excision of intestinal structure 09/07/2016  ? 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 Prediabetes 12/03/2015  ? OSA on CPAP 02/19/2015  ? Lung field abnormal 11/11/2014  ? Morbid obesity ([place]) 10/09/2014  ? Constipation 06/14/2014  ? Peripheral venous insufficiency 05/25/2013  ? Pulmonary HTN ([place]) 05/12/2013  ? Bilateral lower extremity edema 05/12/2013  ? Morbid obesity with BMI of 50.0-59.9, adult ([place])   ? Shoulder joint pain 03/28/2013  ? Abnormal glucose level 02/20/2013  ? Alopecia 02/20/2013  ? Degeneration of lumbar intervertebral disc 02/20/2013  ? Hyperglycemia due to type 2 diabetes mellitus ([place]) 02/20/2013  ? Type 2 diabetes mellitus with other specified complication ([place]) 12/75/1700  ? Back pain, hx of prior back surgery 12/09/2011  ? Pancreatitis, mild, CT negative 12/08/2011  ? Fatty liver 12/08/2011  ? Substernal chest pain, suspected secondary to acute pancreatitis 12/06/2011  ? Hypertension 12/06/2011  ? Hypokalemia 12/06/2011  ? Hypothyroidism 12/06/2011  ? Anxiety 12/06/2011  ? BV (bacterial vaginosis)   ? Hyperlipidemia   ? ?Past Medical History:  ?Diagnosis Date  ? Anemia when she was young  ? Arthritis   ?  Congestion of nasal sinus   ? Constipation   ? takes Colace every other day  ? DDD (degenerative disc disease), lumbar   ? Diabetes mellitus without complication ([place])   ? per pt had been diagnosed but then "number started going down so my doctor said i didnt have diabetes"  ? Dry eyes   ? uses Systane Eye drops daily as needed  ? GERD (gastroesophageal reflux disease) 12/06/2011  ? takes Nexium daily  ? Headache(784.0)   ? occasionally-d/t congestion  ? Heart murmur   ? History of bronchitis 6-7 yrs ago  ?  Hyperlipidemia   ? Hypertension   ? takes Metoprolol and Diovan daily  ? Hypothyroidism 12/06/2011  ? takes Synthroid daily  ? Joint pain   ? Multiple allergies   ? takes Zyrtec daily;uses Nasonex daily as needed  ? Nocturia   ? Numbness   ? weakness-right hand  ? OSA on CPAP   ? Pancreatitis   ? Peripheral edema   ? takes Furosemide daily  ? Shingles   ? Urinary frequency   ? Venous insufficiency of leg   ?  ?Family History  ?Problem Relation Age of Onset  ? Hypertension Mother   ? Hypertension Father   ? Hypertension Sister   ? Kidney disease Sister   ?     KIDNEL TRANSPLANT  ? Thyroid disease Sister   ? Thyroid disease Sister   ? Hypertension Sister   ? Breast cancer Maternal Aunt   ?     >50; passed away from it  ? Breast cancer Maternal Aunt   ?     >50  ?  ?Past Surgical History:  ?Procedure Laterality Date  ? ABDOMINAL HYSTERECTOMY    ? uterine prolapse  ? CARDIAC SURGERY    ? CHOLECYSTECTOMY    ? COLONOSCOPY    ? ESOPHAGOGASTRODUODENOSCOPY  5-74yr ago  ? FOOT SURGERY Bilateral   ? KNEE ARTHROSCOPY    ? LAPAROSCOPIC GASTRIC SLEEVE RESECTION N/A 08/23/2016  ? Procedure: LAPAROSCOPIC GASTRIC SLEEVE RESECTION WITH UPPER ENDO;  Surgeon: MJohnathan Hausen MD;  Location[facility];  Service: General;  Laterality: N/A;  ? POSTERIOR LUMBAR FUSION  03/10/2009  ? L3-4 and L3-4 fusion, Dr. ELeeroy Cha ? SHOULDER ARTHROSCOPY WITH ROTATOR CUFF REPAIR AND SUBACROMIAL DECOMPRESSION Right 08/23/2013  ? Procedure: RIGHT SHOULDER ARTHROSCOPY WITH  SUBACROMIAL DECOMPRESSION DISTAL CLAVICLE RESECTION AND  ROTATOR CUFF REPAIR ;  Surgeon: KMarin Shutter MD;  Location: [facility]  Service: Orthopedics;  Laterality: Right;  ? TUBAL LIGATION    ? ?Social History  ? ?Occupational History  ?  Employer: [employer]  ?  Comment: [employer]  ?Tobacco Use  ? Smoking status: Former  ?  Packs/day: 0.75  ?  Years: 40.00  ?  Pack years: 30.00  ?  Types: Cigarettes  ?  Quit date: 142 ?  Years since quitting: 30.2  ? Smokeless tobacco: Never  ? Tobacco  comments:  ?  quit smoking in 1993  ?Vaping Use  ? Vaping Use: Never used  ?Substance and Sexual Activity  ? Alcohol use: Yes  ?  Alcohol/week: 0.0 standard drinks  ?  Comment: rarely;; mixed drink   ? Drug use: No  ? Sexual activity: Yes  ?  Partners: Male  ?  Birth control/protection: Surgical  ?  Comment: Hysterectomy  ? ? ? ? ? ? ?

## 2021-04-20 ENCOUNTER — Ambulatory Visit (INDEPENDENT_AMBULATORY_CARE_PROVIDER_SITE_OTHER): Payer: 59 | Admitting: Physical Medicine and Rehabilitation

## 2021-04-20 ENCOUNTER — Other Ambulatory Visit: Payer: Self-pay

## 2021-04-20 DIAGNOSIS — M961 Postlaminectomy syndrome, not elsewhere classified: Secondary | ICD-10-CM

## 2021-04-20 DIAGNOSIS — Z9689 Presence of other specified functional implants: Secondary | ICD-10-CM

## 2021-04-20 DIAGNOSIS — G894 Chronic pain syndrome: Secondary | ICD-10-CM

## 2021-04-20 DIAGNOSIS — M5416 Radiculopathy, lumbar region: Secondary | ICD-10-CM

## 2021-04-21 ENCOUNTER — Encounter: Payer: Self-pay | Admitting: Physical Medicine and Rehabilitation

## 2021-04-21 NOTE — Progress Notes (Signed)
Mrs. [name] came in today for reprogramming of her spinal cord stimulator.  [employer] were here and we did provide a room for that.  Reprogramming did seem to help and we always try to provide a room for that to be done once they come and if they need reprogramming.  She has been doing well otherwise. ?

## 2021-04-24 MED ORDER — LIDOCAINE HCL 1 % IJ SOLN
3.0000 mL | INTRAMUSCULAR | Status: AC | PRN
  Administered 2021-04-15: 3 mL

## 2021-04-24 MED ORDER — SODIUM HYALURONATE (VISCOSUP) 20 MG/2ML IX SOSY
20.0000 mg | PREFILLED_SYRINGE | INTRA_ARTICULAR | Status: AC | PRN
  Administered 2021-04-15: 20 mg via INTRA_ARTICULAR

## 2021-05-09 ENCOUNTER — Other Ambulatory Visit (HOSPITAL_COMMUNITY): Payer: Self-pay

## 2021-05-09 ENCOUNTER — Other Ambulatory Visit: Payer: Self-pay | Admitting: Physical Medicine and Rehabilitation

## 2021-05-11 ENCOUNTER — Other Ambulatory Visit (HOSPITAL_COMMUNITY): Payer: Self-pay

## 2021-05-11 MED ORDER — DIAZEPAM 5 MG PO TABS
ORAL_TABLET | ORAL | 3 refills | Status: AC
  Filled 2021-05-11: qty 30, 30d supply, fill #0
  Filled 2021-11-05: qty 30, 30d supply, fill #1

## 2021-05-11 MED ORDER — METHOCARBAMOL 500 MG PO TABS
ORAL_TABLET | Freq: Three times a day (TID) | ORAL | 3 refills | Status: DC | PRN
  Filled 2021-05-11: qty 60, 20d supply, fill #0
  Filled 2021-07-14: qty 60, 20d supply, fill #1
  Filled 2021-09-14: qty 60, 20d supply, fill #2
  Filled 2021-11-05: qty 60, 20d supply, fill #3

## 2021-05-21 ENCOUNTER — Encounter: Payer: Self-pay | Admitting: Podiatrist

## 2021-05-21 ENCOUNTER — Ambulatory Visit: Payer: 59 | Admitting: Podiatrist

## 2021-05-21 ENCOUNTER — Ambulatory Visit (INDEPENDENT_AMBULATORY_CARE_PROVIDER_SITE_OTHER): Payer: 59

## 2021-05-21 DIAGNOSIS — M19071 Primary osteoarthritis, right ankle and foot: Secondary | ICD-10-CM | POA: Diagnosis not present

## 2021-05-21 DIAGNOSIS — M778 Other enthesopathies, not elsewhere classified: Secondary | ICD-10-CM | POA: Diagnosis not present

## 2021-05-21 MED ORDER — TRIAMCINOLONE ACETONIDE 10 MG/ML IJ SUSP
10.0000 mg | Freq: Once | INTRAMUSCULAR | Status: AC
  Administered 2021-05-21: 10 mg

## 2021-05-21 NOTE — Progress Notes (Signed)
?Chief Complaint  ?Patient presents with  ? Foot Pain  ?  right foot pain/ giving out on her  ?  ? ?HPI: Patient is 61 y.o. female who presents today for pain on the right foot she states that it hurts and it gives out on her.  It started about 6 months ago.  Pain is on the lateral side and top of the right foot.  She states that the pain is a 5 out of 10 to a 10 out of 10 at times.  She is tried no self treatments.  She also states that her right knee is bone-on-bone and that she is getting her knee replaced in the future. ? ?Patient Active Problem List  ? Diagnosis Date Noted  ? Memory change 12/25/2020  ? Chronic pain syndrome 05/04/2019  ? Raynaud's disease 04/13/2019  ? Lumbar radiculopathy 12/18/2018  ? Spinal stenosis of lumbar region with neurogenic claudication 12/18/2018  ? Post laminectomy syndrome 12/18/2018  ? Bilateral primary osteoarthritis of knee 12/27/2016  ? History of excision of intestinal structure 09/07/2016  ? 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 Prediabetes 12/03/2015  ? OSA on CPAP 02/19/2015  ? Lung field abnormal 11/11/2014  ? Morbid obesity ([place]) 10/09/2014  ? Constipation 06/14/2014  ? Peripheral venous insufficiency 05/25/2013  ? Pulmonary HTN ([place]) 05/12/2013  ? Bilateral lower extremity edema 05/12/2013  ? Morbid obesity with BMI of 50.0-59.9, adult ([place])   ? Shoulder joint pain 03/28/2013  ? Abnormal glucose level 02/20/2013  ? Alopecia 02/20/2013  ? Degeneration of lumbar intervertebral disc 02/20/2013  ? Hyperglycemia due to type 2 diabetes mellitus ([place]) 02/20/2013  ? Type 2 diabetes mellitus with other specified complication ([place]) 12/45/8099  ? Back pain, hx of prior back surgery 12/09/2011  ? Pancreatitis, mild, CT negative 12/08/2011  ? Fatty liver 12/08/2011  ? Substernal chest pain, suspected secondary to acute pancreatitis 12/06/2011  ? Hypertension 12/06/2011  ? Hypokalemia 12/06/2011  ? Hypothyroidism 12/06/2011  ? Anxiety 12/06/2011  ? BV  (bacterial vaginosis)   ? Hyperlipidemia   ? ? ?Current Outpatient Medications on File Prior to Visit  ?Medication Sig Dispense Refill  ? albuterol (PROVENTIL HFA;VENTOLIN HFA) 108 (90 BASE) MCG/ACT inhaler Inhale 1-2 puffs into the lungs every 6 (six) hours as needed for wheezing or shortness of breath. 1 Inhaler 2  ? Apoaequorin (PREVAGEN PO) Take by mouth daily.    ? aspirin EC 81 MG tablet Take 81 mg by mouth daily.     ? Azelastine HCl 137 MCG/SPRAY SOLN PLACE 1 SPRAY IN EACH NOSTRIL ONCE NIGHTLY 30 mL 3  ? azithromycin (ZITHROMAX) 250 MG tablet Use as directed on package 6 tablet 0  ? baclofen (LIORESAL) 10 MG tablet TAKE 1/2-1 TABLET BY MOUTH EVERY 8 HOURS AS NEEDED FOR SPASMS 60 tablet 1  ? betamethasone valerate ointment (VALISONE) 0.1 % Apply a pea sized amount topically BID for up to 2 weeks. Not for daily long term use. 30 g 0  ? diazepam (VALIUM) 5 MG tablet Take 1 by mouth 1 hour  pre-procedure with very light food. May bring 2nd tablet to appointment. 2 tablet 0  ? diazepam (VALIUM) 5 MG tablet Take 1 tablet by mouth once daily as needed for neck pain 30 tablet 3  ? esomeprazole (NEXIUM) 40 MG capsule TAKE 1 CAPSULE BY MOUTH ONCE DAILY 90 capsule 0  ? fluticasone (FLONASE) 50 MCG/ACT nasal spray PLACE 2 SPRAYS IN EACH NOSTRIL DAILY AS DIRECTED 16 g  3  ? fluticasone (FLONASE) 50 MCG/ACT nasal spray Use 1 spray in each nostril daily 48 g 4  ? furosemide (LASIX) 20 MG tablet Take 3 tablets (60 mg total) by mouth as needed. NEED OV. 90 tablet 3  ? gabapentin (NEURONTIN) 300 MG capsule TAKE 1 CAPSULE BY MOUTH 3 TIMES DAILY 270 capsule 1  ? levothyroxine (SYNTHROID) 75 MCG tablet TAKE 1 TABLET BY MOUTH EVERY MORNING ON AN EMPTY STOMACH 90 tablet 2  ? lisinopril (ZESTRIL) 40 MG tablet TAKE 1 TABLET BY MOUTH ONCE A DAY 90 tablet 1  ? loratadine (CLARITIN) 10 MG tablet Take 10 mg by mouth daily.    ? magnesium oxide (MAG-OX) 400 MG tablet Take 400 mg by mouth daily.    ? meloxicam (MOBIC) 15 MG tablet TAKE 1  TABLET BY MOUTH ONCE DAILY 90 tablet 0  ? methocarbamol (ROBAXIN) 500 MG tablet TAKE 1 TABLET BY MOUTH EVERY 8 HOURS AS NEEDED FOR MUSCLE SPASMS 60 tablet 3  ? metoprolol tartrate (LOPRESSOR) 25 MG tablet TAKE 1 TABLET BY MOUTH 2 TIMES DAILY 180 tablet 3  ? metoprolol tartrate (LOPRESSOR) 25 MG tablet TAKE 1 TABLET BY MOUTH 2 TIMES DAILY 180 tablet 0  ? nystatin cream (MYCOSTATIN) Apply 1 application topically 2 (two) times daily. Apply to affected area BID for up to 7 days. 30 g 1  ? ofloxacin (OCUFLOX) 0.3 % ophthalmic solution Place 1 drop into left eye 3 times a day for 1 week, then 1 drop twice a day x 1 week then 1 drop daily x 14 days (starting day after surgery) 5 mL 0  ? olmesartan (BENICAR) 20 MG tablet Take 1 tablet (20 mg total) by mouth daily. 90 tablet 3  ? Omega-3 Fatty Acids (FISH OIL) 1000 MG CAPS Take 1,000 mg by mouth daily.    ? potassium chloride SA (K-DUR) 20 MEQ tablet TAKE 1 TABLET (20 MEQ TOTAL) BY MOUTH DAILY. 90 tablet 1  ? prednisoLONE acetate (PRED FORTE) 1 % ophthalmic suspension Place 1 drop into left eye 3 times a day for 1 week, then 1 drop twice a day for 1 week, then 1 drop once a day for 14 days (starting the day after surgery) 5 mL 0  ? TURMERIC CURCUMIN PO Take by mouth daily.    ? vitamin C (ASCORBIC ACID) 500 MG tablet Take 500 mg by mouth daily.    ? VITAMIN D PO Take by mouth.    ? ?No current facility-administered medications on file prior to visit.  ? ? ?Allergies  ?Allergen Reactions  ? Metronidazole   ?  Developed pancreatitis after taking this  ? Adhesive [Tape] Itching and Rash  ? Latex Itching and Rash  ? Penicillins Rash  ?  Has patient had a PCN reaction causing immediate rash, facial/tongue/throat swelling, SOB or lightheadedness with hypotension: No ?Has patient had a PCN reaction causing severe rash involving mucus membranes or skin necrosis: No ?Has patient had a PCN reaction that required hospitalization No ?Has patient had a PCN reaction occurring within the  last 10 years: No ?If all of the above answers are "NO", then may proceed with Cephalosporin use.  ? Vesicare [Solifenacin] Rash  ? ? ?Review of Systems ?No fevers, chills, nausea, muscle aches, no difficulty breathing, no calf pain, no chest pain or shortness of breath. ? ? ?Physical Exam ? ?GENERAL APPEARANCE: Alert, conversant. Appropriately groomed. No acute distress.  ? ?VASCULAR: Pedal pulses palpable DP and PT bilateral.  Capillary  refill time is immediate to all digits,  Proximal to distal cooling it warm to warm.  Digital perfusion adequate.  ? ?NEUROLOGIC: sensation is intact to 5.07 monofilament at 5/5 sites bilateral.  Light touch is intact bilateral, vibratory sensation intact bilateral ? ?MUSCULOSKELETAL: acceptable muscle strength, tone and stability bilateral.  Pes planus foot type is noted.  Pain on palpation dorsal lateral aspect of the right foot at the fourth and fifth intermetatarsal region.  No pain at the insertion of the peroneal tendon on he fifth metatarsal base right.. ? ?DERMATOLOGIC: skin is warm, supple, and dry.  Color, texture, and turgor of skin within normal limits.  No open wounds are noted.  No preulcerative lesions are seen.  Digital nails are asymptomatic.   ? ?X-ray evaluation 3 views of right foot are obtained.  Planus foot type is seen.  Inferior and posterior calcaneal spurring is seen.  Contracture of lesser digits is noted.  No sign of fracture or joint dislocation is seen of the right foot.  No fracture of the fifth metatarsal base or fourth metatarsal is noted at the area of pain.  Riddick changes at the midfoot are noted. ? ?Assessment  ? ?  ICD-10-CM   ?1. Capsulitis of foot, right  M77.8 DG Foot Complete Right  ?  ?2. Arthritis of midtarsal joint of right foot  M19.071   ?  ? ? ? ?Plan ? ?Discussed x-ray and exam findings with the patient.  I recommended an injection of steroid into the lateral aspect of the right foot at the area of pain in order to decrease the  inflammation in this area and help with pain control.  The patient agreed to prepped the skin with alcohol and infiltrated Kenalog mixed with Marcaine plain into the area of maximal tenderness on the right foot.  S

## 2021-05-21 NOTE — Patient Instructions (Signed)
Try Voltaren gel on your foot if the pain continues. ?Wear a supportive anklet to help with the swelling as well as the pain on the right foot.  ?

## 2021-05-30 ENCOUNTER — Other Ambulatory Visit: Payer: Self-pay | Admitting: Physical Medicine and Rehabilitation

## 2021-05-30 ENCOUNTER — Other Ambulatory Visit (HOSPITAL_COMMUNITY): Payer: Self-pay

## 2021-06-01 ENCOUNTER — Other Ambulatory Visit (HOSPITAL_COMMUNITY): Payer: Self-pay

## 2021-06-01 MED ORDER — METOPROLOL TARTRATE 25 MG PO TABS
25.0000 mg | ORAL_TABLET | Freq: Two times a day (BID) | ORAL | 1 refills | Status: DC
  Filled 2021-06-01: qty 180, 90d supply, fill #0
  Filled 2021-07-14 – 2021-09-16 (×2): qty 180, 90d supply, fill #1

## 2021-06-01 MED ORDER — GABAPENTIN 300 MG PO CAPS
ORAL_CAPSULE | Freq: Three times a day (TID) | ORAL | 1 refills | Status: DC
  Filled 2021-06-01: qty 270, 90d supply, fill #0
  Filled 2021-11-05: qty 270, 90d supply, fill #1

## 2021-06-03 ENCOUNTER — Other Ambulatory Visit (HOSPITAL_COMMUNITY): Payer: Self-pay

## 2021-06-04 ENCOUNTER — Other Ambulatory Visit (HOSPITAL_COMMUNITY): Payer: Self-pay

## 2021-06-08 ENCOUNTER — Other Ambulatory Visit (HOSPITAL_COMMUNITY): Payer: Self-pay

## 2021-06-11 ENCOUNTER — Other Ambulatory Visit (HOSPITAL_COMMUNITY): Payer: Self-pay

## 2021-06-16 ENCOUNTER — Ambulatory Visit (INDEPENDENT_AMBULATORY_CARE_PROVIDER_SITE_OTHER): Payer: 59 | Admitting: Orthopaedic Surgery

## 2021-06-16 ENCOUNTER — Encounter: Payer: Self-pay | Admitting: Orthopaedic Surgery

## 2021-06-16 VITALS — BP 151/96 | HR 82 | Ht 63.0 in | Wt 232.0 lb

## 2021-06-16 DIAGNOSIS — M170 Bilateral primary osteoarthritis of knee: Secondary | ICD-10-CM

## 2021-06-16 NOTE — Progress Notes (Unsigned)
Office Visit Note   Patient: [name]           Date of Birth: 03-Aug-1960           MRN: [id number] Visit Date: 06/16/2021              Requested by: Velna Hatchet, MD [address] [place],  [place] [address] PCP: Velna Hatchet, MD   Assessment & Plan: Visit Diagnoses: No diagnosis found.  Plan: ***  Follow-Up Instructions: No follow-ups on file.   Orders:  No orders of the defined types were placed in this encounter.  No orders of the defined types were placed in this encounter.     Procedures: No procedures performed   Clinical Data: No additional findings.   Subjective: Chief Complaint  Patient presents with   Right Knee - Pain, Follow-up   Left Knee - Pain, Follow-up    HPI  Review of Systems   Objective: Vital Signs: BP (!) 151/96   Pulse 82   Ht '5\' 3"'$  (1.6 m)   Wt 232 lb (105.2 kg)   BMI 41.10 kg/m   Physical Exam  Ortho Exam  Specialty Comments:  No specialty comments available.  Imaging: No results found.   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 08/23/2016   Prediabetes 12/03/2015   OSA on CPAP 02/19/2015   Lung field abnormal 11/11/2014   Morbid obesity ([place]) 10/09/2014   Constipation 06/14/2014   Peripheral venous insufficiency 05/25/2013   Pulmonary HTN ([place]) 05/12/2013   Bilateral lower extremity edema 05/12/2013   Morbid obesity with BMI of 50.0-59.9, adult ([place])    Shoulder joint pain 03/28/2013   Abnormal glucose level 02/20/2013   Alopecia 02/20/2013   Degeneration of lumbar  intervertebral disc 02/20/2013   Hyperglycemia due to type 2 diabetes mellitus ([place]) 02/20/2013   Type 2 diabetes mellitus with other specified complication ([place]) 40/10/2723   Back pain, hx of prior back surgery 12/09/2011   Pancreatitis, mild, CT negative 12/08/2011   Fatty liver 12/08/2011   Substernal chest pain, suspected secondary to acute pancreatitis 12/06/2011   Hypertension 12/06/2011   Hypokalemia 12/06/2011   Hypothyroidism 12/06/2011   Anxiety 12/06/2011   BV (bacterial vaginosis)    Hyperlipidemia    Past Medical History:  Diagnosis Date   Anemia when she was young   Arthritis    Congestion of nasal sinus    Constipation    takes Colace every other day   DDD (degenerative disc disease), lumbar    Diabetes mellitus without complication ([place])    per pt had been diagnosed but then "number started going down so my doctor said i didnt have diabetes"   Dry eyes    uses Systane Eye drops daily as needed   GERD (gastroesophageal reflux disease) 12/06/2011   takes Nexium daily   Headache(784.0)    occasionally-d/t congestion   Heart murmur    History of bronchitis 6-7 yrs ago   Hyperlipidemia    Hypertension    takes Metoprolol and Diovan daily   Hypothyroidism 12/06/2011   takes Synthroid daily   Joint pain  Multiple allergies    takes Zyrtec daily;uses Nasonex daily as needed   Nocturia    Numbness    weakness-right hand   OSA on CPAP    Pancreatitis    Peripheral edema    takes Furosemide daily   Shingles    Urinary frequency    Venous insufficiency of leg     Family History  Problem Relation Age of Onset   Hypertension Mother    Hypertension Father    Hypertension Sister    Kidney disease Sister        [employer] TRANSPLANT   Thyroid disease Sister    Thyroid disease Sister    Hypertension Sister    Breast cancer Maternal Aunt        >50; passed away from it   Breast cancer Maternal Aunt        >50    Past Surgical History:  Procedure  Laterality Date   ABDOMINAL HYSTERECTOMY     uterine prolapse   CARDIAC SURGERY     CHOLECYSTECTOMY     COLONOSCOPY     ESOPHAGOGASTRODUODENOSCOPY  5-13yr ago   FOOT SURGERY Bilateral    KNEE ARTHROSCOPY     LAPAROSCOPIC GASTRIC SLEEVE RESECTION N/A 08/23/2016   Procedure: LAPAROSCOPIC GASTRIC SLEEVE RESECTION WITH UPPER ENDO;  Surgeon: MJohnathan Hausen MD;  Location: [facility];  Service: General;  Laterality: N/A;   POSTERIOR LUMBAR FUSION  03/10/2009   L3-4 and L3-4 fusion, Dr. ELeeroy Cha  SHOULDER ARTHROSCOPY WITH ROTATOR CUFF REPAIR AND SUBACROMIAL DECOMPRESSION Right 08/23/2013   Procedure: RIGHT SHOULDER ARTHROSCOPY WITH  SUBACROMIAL DECOMPRESSION DISTAL CLAVICLE RESECTION AND  ROTATOR CUFF REPAIR ;  Surgeon: KMarin Shutter MD;  [facility]  Service: Orthopedics;  Laterality: Right;   TUBAL LIGATION     Social History   Occupational History    Employer: [employer]    Comment: [employer]  Tobacco Use   Smoking status: Former    Packs/day: 0.75    Years: 40.00    Pack years: 30.00    Types: Cigarettes    Quit date: 1993    Years since quitting: 30.4   Smokeless tobacco: Never   Tobacco comments:    quit smoking in 1993  Vaping Use   Vaping Use: Never used  Substance and Sexual Activity   Alcohol use: Yes    Alcohol/week: 0.0 standard drinks    Comment: rarely;; mixed drink    Drug use: No   Sexual activity: Yes    Partners: Male    Birth control/protection: Surgical    Comment: Hysterectomy

## 2021-07-14 ENCOUNTER — Other Ambulatory Visit (HOSPITAL_COMMUNITY): Payer: Self-pay

## 2021-07-15 ENCOUNTER — Other Ambulatory Visit (HOSPITAL_COMMUNITY): Payer: Self-pay

## 2021-07-15 MED ORDER — MELOXICAM 15 MG PO TABS
15.0000 mg | ORAL_TABLET | Freq: Every day | ORAL | 0 refills | Status: DC
  Filled 2021-07-15 – 2021-08-07 (×2): qty 90, 90d supply, fill #0

## 2021-07-15 MED ORDER — ESOMEPRAZOLE MAGNESIUM 40 MG PO CPDR
40.0000 mg | DELAYED_RELEASE_CAPSULE | Freq: Every day | ORAL | 0 refills | Status: DC
  Filled 2021-07-15 – 2021-08-07 (×2): qty 90, 90d supply, fill #0

## 2021-07-17 ENCOUNTER — Other Ambulatory Visit (HOSPITAL_COMMUNITY): Payer: Self-pay

## 2021-07-20 ENCOUNTER — Other Ambulatory Visit (HOSPITAL_COMMUNITY): Payer: Self-pay

## 2021-07-22 ENCOUNTER — Other Ambulatory Visit (HOSPITAL_COMMUNITY): Payer: Self-pay

## 2021-08-01 ENCOUNTER — Other Ambulatory Visit (HOSPITAL_COMMUNITY): Payer: Self-pay

## 2021-08-07 ENCOUNTER — Other Ambulatory Visit (HOSPITAL_COMMUNITY): Payer: Self-pay

## 2021-08-07 ENCOUNTER — Other Ambulatory Visit: Payer: Self-pay | Admitting: Obstetrics and Gynecology

## 2021-08-10 ENCOUNTER — Other Ambulatory Visit (HOSPITAL_COMMUNITY): Payer: Self-pay

## 2021-08-11 ENCOUNTER — Telehealth: Payer: Self-pay | Admitting: *Deleted

## 2021-08-11 ENCOUNTER — Other Ambulatory Visit: Payer: Self-pay | Admitting: Obstetrics and Gynecology

## 2021-08-11 ENCOUNTER — Other Ambulatory Visit (HOSPITAL_COMMUNITY): Payer: Self-pay

## 2021-08-11 MED ORDER — NYSTATIN 100000 UNIT/GM EX CREA
1.0000 | TOPICAL_CREAM | Freq: Two times a day (BID) | CUTANEOUS | 1 refills | Status: DC
  Filled 2021-08-11 – 2021-08-27 (×2): qty 30, 7d supply, fill #0
  Filled 2022-05-31: qty 30, 7d supply, fill #1

## 2021-08-11 NOTE — Telephone Encounter (Signed)
Patient called and left message in triage voicemail requesting refill on cream. The office received a refill request for a vaginal cream on 08/07/21 Rx was denied. Note attached patient needs OV to treatment. I called patient to relay this to her however her voicemail is full.

## 2021-08-11 NOTE — Telephone Encounter (Signed)
Last AEX 12/25/20 Last mammo 01/2021.   Prescribed PRN for intermittent vulvitis, last Rx'd in 2021.

## 2021-08-13 ENCOUNTER — Other Ambulatory Visit (HOSPITAL_COMMUNITY): Payer: Self-pay

## 2021-08-13 MED ORDER — LAGEVRIO 200 MG PO CAPS
ORAL_CAPSULE | ORAL | 0 refills | Status: DC
  Filled 2021-08-13: qty 40, 5d supply, fill #0

## 2021-08-14 ENCOUNTER — Other Ambulatory Visit (HOSPITAL_COMMUNITY): Payer: Self-pay

## 2021-08-17 ENCOUNTER — Other Ambulatory Visit (HOSPITAL_COMMUNITY): Payer: Self-pay

## 2021-08-18 ENCOUNTER — Ambulatory Visit: Payer: 59 | Admitting: Orthopaedic Surgery

## 2021-08-18 ENCOUNTER — Encounter: Payer: Self-pay | Admitting: Orthopaedic Surgery

## 2021-08-18 VITALS — BP 154/86 | HR 68 | Ht 63.0 in | Wt 222.6 lb

## 2021-08-18 DIAGNOSIS — M170 Bilateral primary osteoarthritis of knee: Secondary | ICD-10-CM

## 2021-08-19 DIAGNOSIS — M170 Bilateral primary osteoarthritis of knee: Secondary | ICD-10-CM | POA: Diagnosis not present

## 2021-08-19 MED ORDER — BUPIVACAINE HCL 0.25 % IJ SOLN
4.0000 mL | INTRAMUSCULAR | Status: AC | PRN
  Administered 2021-08-19: 4 mL via INTRA_ARTICULAR

## 2021-08-19 MED ORDER — METHYLPREDNISOLONE ACETATE 40 MG/ML IJ SUSP
40.0000 mg | INTRAMUSCULAR | Status: AC | PRN
  Administered 2021-08-19: 40 mg via INTRA_ARTICULAR

## 2021-08-19 MED ORDER — LIDOCAINE HCL 1 % IJ SOLN
0.5000 mL | INTRAMUSCULAR | Status: AC | PRN
  Administered 2021-08-19: .5 mL

## 2021-08-19 MED ORDER — BUPIVACAINE HCL 0.5 % IJ SOLN
3.0000 mL | INTRAMUSCULAR | Status: AC | PRN
  Administered 2021-08-19: 3 mL via INTRA_ARTICULAR

## 2021-08-19 NOTE — Progress Notes (Signed)
Office Visit Note   Patient: [name]           Date of Birth: [birth year]           MRN: [id number] Visit Date: 08/18/2021              Requested by: Velna Hatchet, MD [address] [place],  [place] [address] PCP: Velna Hatchet, MD   Assessment & Plan: Visit Diagnoses:  1. Bilateral primary osteoarthritis of knee     Plan: Bilateral knee injection performed.  We will check her in follow-up in a few months and she wants to proceed with right total knee arthroplasty at the end of the year.  Follow-Up Instructions: No follow-ups on file.   Orders:  Orders Placed This Encounter  Procedures   Large Joint Inj   Large Joint Inj   No orders of the defined types were placed in this encounter.     Procedures: Large Joint Inj: R knee on 08/19/2021 7:48 PM Indications: pain and joint swelling Details: 22 G 1.5 in needle, anterolateral approach  Arthrogram: No  Medications: 40 mg methylPREDNISolone acetate 40 MG/ML; 0.5 mL lidocaine 1 %; 4 mL bupivacaine 0.25 % Outcome: tolerated well, no immediate complications Procedure, treatment alternatives, risks and benefits explained, specific risks discussed. Consent was given by the patient. Immediately prior to procedure a time out was called to verify the correct patient, procedure, equipment, support staff and site/side marked as required. Patient was prepped and draped in the usual sterile fashion.    Large Joint Inj: L knee on 08/19/2021 7:48 PM Indications: joint swelling and pain Details: 22 G 1.5 in needle, anterolateral approach  Arthrogram: No  Medications: 0.5 mL lidocaine 1 %; 3 mL bupivacaine 0.5 %; 40 mg methylPREDNISolone acetate 40 MG/ML Outcome: tolerated well, no immediate complications Procedure, treatment alternatives, risks and benefits explained, specific risks discussed. Consent was given by the patient. Immediately prior to procedure a time out was called to verify the correct patient, procedure,  equipment, support staff and site/side marked as required. Patient was prepped and draped in the usual sterile fashion.       Clinical Data: No additional findings.   Subjective: Chief Complaint  Patient presents with   Right Knee - Pain   Left Knee - Pain    HPI 61 year old female with BMI now 39.44 requesting bilateral knee injections.  Last injections were in March.  She is continuing to gradually lose some weight.  She is still wanting to proceed with right total knee arthroplasty at the end of the year.  I congratulated her on the weight loss.  Review of Systems 14 point system update unchanged from 06/16/2021 other than continued gradual weight loss with now BMI below 40.   Objective: Vital Signs: BP (!) 154/86   Pulse 68   Ht '5\' 3"'$  (1.6 m)   Wt 222 lb 9.6 oz (101 kg)   BMI 39.43 kg/m   Physical Exam Constitutional:      Appearance: She is well-developed.  HENT:     Head: Normocephalic.     Right Ear: External ear normal.     Left Ear: External ear normal. There is no impacted cerumen.  Eyes:     Pupils: Pupils are equal, round, and reactive to light.  Neck:     Thyroid: No thyromegaly.     Trachea: No tracheal deviation.  Cardiovascular:     Rate and Rhythm: Normal rate.  Pulmonary:     Effort: Pulmonary  effort is normal.  Abdominal:     Palpations: Abdomen is soft.  Musculoskeletal:     Cervical back: No rigidity.  Skin:    General: Skin is warm and dry.  Neurological:     Mental Status: She is alert and oriented to person, place, and time.  Psychiatric:        Behavior: Behavior normal.     Ortho Exam negative logroll to the hips right and left.  Crepitus with knee range of motion.  Some tenderness.  She is amatory with knee limp right and left slightly more right than left.  Distal pulses are intact.  Specialty Comments:  No specialty comments available.  Imaging: No results found.   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 08/23/2016   Prediabetes 12/03/2015   OSA on CPAP 02/19/2015   Lung field abnormal 11/11/2014   Morbid obesity ([place]) 10/09/2014   Constipation 06/14/2014   Peripheral venous insufficiency 05/25/2013   Pulmonary HTN ([place]) 05/12/2013   Bilateral lower extremity edema 05/12/2013   Morbid obesity with BMI of 50.0-59.9, adult ([place])    Shoulder joint pain 03/28/2013   Abnormal glucose level 02/20/2013   Alopecia 02/20/2013   Degeneration of lumbar intervertebral disc 02/20/2013   Hyperglycemia due to type 2 diabetes mellitus ([place]) 02/20/2013   Type 2 diabetes mellitus with other specified complication ([place]) 49/70/2637   Back pain, hx of prior back surgery 12/09/2011   Pancreatitis, mild, CT negative 12/08/2011   Fatty liver 12/08/2011   Substernal chest pain, suspected secondary to acute pancreatitis 12/06/2011   Hypertension 12/06/2011   Hypokalemia 12/06/2011   Hypothyroidism 12/06/2011   Anxiety 12/06/2011   BV (bacterial vaginosis)    Hyperlipidemia    Past Medical History:  Diagnosis Date   Anemia when she was young   Arthritis    Congestion of nasal sinus    Constipation    takes Colace every other day   DDD (degenerative disc disease), lumbar    Diabetes mellitus without complication ([place])    per pt had been diagnosed but then "number started going down so my doctor said i didnt have diabetes"   Dry eyes    uses Systane Eye drops daily as needed   GERD (gastroesophageal reflux disease) 12/06/2011   takes Nexium daily   Headache(784.0)    occasionally-d/t  congestion   Heart murmur    History of bronchitis 6-7 yrs ago   Hyperlipidemia    Hypertension    takes Metoprolol and Diovan daily   Hypothyroidism 12/06/2011   takes Synthroid daily   Joint pain    Multiple allergies    takes Zyrtec daily;uses Nasonex daily as needed   Nocturia    Numbness    weakness-right hand   OSA on CPAP    Pancreatitis    Peripheral edema    takes Furosemide daily   Shingles    Urinary frequency    Venous insufficiency of leg     Family History  Problem Relation Age of Onset   Hypertension Mother    Hypertension Father    Hypertension Sister  Kidney disease Sister        [facility] TRANSPLANT   Thyroid disease Sister    Thyroid disease Sister    Hypertension Sister    Breast cancer Maternal Aunt        >50; passed away from it   Breast cancer Maternal Aunt        >50    Past Surgical History:  Procedure Laterality Date   ABDOMINAL HYSTERECTOMY     uterine prolapse   CARDIAC SURGERY     CHOLECYSTECTOMY     COLONOSCOPY     ESOPHAGOGASTRODUODENOSCOPY  5-2yr ago   FOOT SURGERY Bilateral    KNEE ARTHROSCOPY     LAPAROSCOPIC GASTRIC SLEEVE RESECTION N/A 08/23/2016   Procedure: LAPAROSCOPIC GASTRIC SLEEVE RESECTION WITH UPPER ENDO;  Surgeon: MJohnathan Hausen MD;  [facility];  Service: General;  Laterality: N/A;   POSTERIOR LUMBAR FUSION  03/10/2009   L3-4 and L3-4 fusion, Dr. ELeeroy Cha  SHOULDER ARTHROSCOPY WITH ROTATOR CUFF REPAIR AND SUBACROMIAL DECOMPRESSION Right 08/23/2013   Procedure: RIGHT SHOULDER ARTHROSCOPY WITH  SUBACROMIAL DECOMPRESSION DISTAL CLAVICLE RESECTION AND  ROTATOR CUFF REPAIR ;  Surgeon: KMarin Shutter MD;  [facility]  Service: Orthopedics;  Laterality: Right;   TUBAL LIGATION     Social History   Occupational History    Employer: [employer]    Comment: [employer]  Tobacco Use   Smoking status: Former    Packs/day: 0.75    Years: 40.00    Total pack years: 30.00    Types: Cigarettes    Quit date:  1993    Years since quitting: 30.5   Smokeless tobacco: Never   Tobacco comments:    quit smoking in 1993  Vaping Use   Vaping Use: Never used  Substance and Sexual Activity   Alcohol use: Yes    Alcohol/week: 0.0 standard drinks of alcohol    Comment: rarely;; mixed drink    Drug use: No   Sexual activity: Yes    Partners: Male    Birth control/protection: Surgical    Comment: Hysterectomy

## 2021-08-27 ENCOUNTER — Other Ambulatory Visit (HOSPITAL_COMMUNITY): Payer: Self-pay

## 2021-09-04 DIAGNOSIS — G4733 Obstructive sleep apnea (adult) (pediatric): Secondary | ICD-10-CM | POA: Diagnosis not present

## 2021-09-14 ENCOUNTER — Other Ambulatory Visit (HOSPITAL_COMMUNITY): Payer: Self-pay

## 2021-09-15 ENCOUNTER — Other Ambulatory Visit (HOSPITAL_COMMUNITY): Payer: Self-pay

## 2021-09-15 MED ORDER — LEVOTHYROXINE SODIUM 75 MCG PO TABS
75.0000 ug | ORAL_TABLET | Freq: Every morning | ORAL | 2 refills | Status: DC
  Filled 2021-09-15: qty 90, 90d supply, fill #0
  Filled 2021-11-05 – 2021-12-25 (×2): qty 90, 90d supply, fill #1

## 2021-09-16 ENCOUNTER — Other Ambulatory Visit (HOSPITAL_COMMUNITY): Payer: Self-pay

## 2021-09-16 MED ORDER — MELOXICAM 15 MG PO TABS
15.0000 mg | ORAL_TABLET | Freq: Every day | ORAL | 0 refills | Status: DC
  Filled 2021-09-16 – 2021-11-20 (×3): qty 90, 90d supply, fill #0

## 2021-09-16 MED ORDER — FLUTICASONE PROPIONATE 50 MCG/ACT NA SUSP
NASAL | 4 refills | Status: DC
  Filled 2021-09-16: qty 16, 60d supply, fill #0
  Filled 2022-01-04: qty 16, 60d supply, fill #1
  Filled 2022-03-09: qty 16, 60d supply, fill #2
  Filled 2022-05-12: qty 16, 60d supply, fill #3
  Filled 2022-07-12: qty 16, 60d supply, fill #4

## 2021-09-17 ENCOUNTER — Other Ambulatory Visit (HOSPITAL_COMMUNITY): Payer: Self-pay

## 2021-09-18 ENCOUNTER — Other Ambulatory Visit (HOSPITAL_COMMUNITY): Payer: Self-pay

## 2021-09-29 DIAGNOSIS — E1169 Type 2 diabetes mellitus with other specified complication: Secondary | ICD-10-CM | POA: Diagnosis not present

## 2021-09-29 DIAGNOSIS — R7989 Other specified abnormal findings of blood chemistry: Secondary | ICD-10-CM | POA: Diagnosis not present

## 2021-09-29 DIAGNOSIS — E785 Hyperlipidemia, unspecified: Secondary | ICD-10-CM | POA: Diagnosis not present

## 2021-09-29 DIAGNOSIS — I10 Essential (primary) hypertension: Secondary | ICD-10-CM | POA: Diagnosis not present

## 2021-10-06 DIAGNOSIS — I10 Essential (primary) hypertension: Secondary | ICD-10-CM | POA: Diagnosis not present

## 2021-10-06 DIAGNOSIS — Z0000 Encounter for general adult medical examination without abnormal findings: Secondary | ICD-10-CM | POA: Diagnosis not present

## 2021-10-06 DIAGNOSIS — M1990 Unspecified osteoarthritis, unspecified site: Secondary | ICD-10-CM | POA: Diagnosis not present

## 2021-10-06 DIAGNOSIS — Z1331 Encounter for screening for depression: Secondary | ICD-10-CM | POA: Diagnosis not present

## 2021-10-06 DIAGNOSIS — R4189 Other symptoms and signs involving cognitive functions and awareness: Secondary | ICD-10-CM | POA: Diagnosis not present

## 2021-10-06 DIAGNOSIS — E119 Type 2 diabetes mellitus without complications: Secondary | ICD-10-CM | POA: Diagnosis not present

## 2021-10-06 DIAGNOSIS — E1169 Type 2 diabetes mellitus with other specified complication: Secondary | ICD-10-CM | POA: Diagnosis not present

## 2021-10-06 DIAGNOSIS — Z1339 Encounter for screening examination for other mental health and behavioral disorders: Secondary | ICD-10-CM | POA: Diagnosis not present

## 2021-10-06 DIAGNOSIS — E785 Hyperlipidemia, unspecified: Secondary | ICD-10-CM | POA: Diagnosis not present

## 2021-10-06 DIAGNOSIS — E1165 Type 2 diabetes mellitus with hyperglycemia: Secondary | ICD-10-CM | POA: Diagnosis not present

## 2021-11-05 ENCOUNTER — Other Ambulatory Visit (HOSPITAL_COMMUNITY): Payer: Self-pay

## 2021-11-06 ENCOUNTER — Other Ambulatory Visit (HOSPITAL_COMMUNITY): Payer: Self-pay

## 2021-11-06 MED ORDER — ESOMEPRAZOLE MAGNESIUM 40 MG PO CPDR
40.0000 mg | DELAYED_RELEASE_CAPSULE | Freq: Every day | ORAL | 3 refills | Status: DC
  Filled 2021-11-06: qty 90, 90d supply, fill #0
  Filled 2022-02-03: qty 90, 90d supply, fill #1
  Filled 2022-05-31 – 2022-07-24 (×3): qty 90, 90d supply, fill #2

## 2021-11-07 ENCOUNTER — Other Ambulatory Visit (HOSPITAL_COMMUNITY): Payer: Self-pay

## 2021-11-12 ENCOUNTER — Other Ambulatory Visit (HOSPITAL_COMMUNITY): Payer: Self-pay

## 2021-11-20 ENCOUNTER — Other Ambulatory Visit (HOSPITAL_COMMUNITY): Payer: Self-pay

## 2021-12-03 DIAGNOSIS — G4733 Obstructive sleep apnea (adult) (pediatric): Secondary | ICD-10-CM | POA: Diagnosis not present

## 2021-12-15 ENCOUNTER — Other Ambulatory Visit (HOSPITAL_COMMUNITY): Payer: Self-pay

## 2021-12-16 NOTE — Progress Notes (Signed)
61 y.o. G79P2002 Divorced Black or [place] American Not Hispanic or Latino female here for annual exam.  H/O hysterectomy. Not currently sexually active.   Pt wants to discuss discoloration and skin itching near vaginal areas for the last 6 months. She feels a little bump on the left. No vaginal discharge.   H/O cystocele, not bothersome. No urinary c/o.  No bowel c/o.   On gabapentin for vasomotor symptoms and pain. Her Ortho provider is giving her the gabapentin. No longer having vasomotor symptoms.    Multiple medical problems, including obesity, HTN and hyperlipidemia.   She has lost over 30 lbs and her HgbA1C is normal. Doing labs with her primary (not part of [facility]).   She is going to have knee replacements, will start with the right knee.   No LMP recorded. Patient has had a hysterectomy.          Sexually active: No.  The current method of family planning is status post hysterectomy.    Exercising: No.   Smoker:  no, former  Health Maintenance: Pap:  2015 normal per patient   History of abnormal Pap:  no MMG:  02/12/21 Bi-rads 2 benign Left breast US done with same results  BMD:    a few years ago normal per patient   Colonoscopy: 2013 normal per patient , scheduled for next year.  TDaP:  03/25/13  Gardasil: n/a   reports that she quit smoking about 30 years ago. Her smoking use included cigarettes. She has a 30.00 pack-year smoking history. She has never used smokeless tobacco. She reports current alcohol use. She reports that she does not use drugs. Works in [employer]. 2 kids, local. 3 grandchildren (78, 42, 33).  Past Medical History:  Diagnosis Date   Anemia when she was young   Arthritis    Congestion of nasal sinus    Constipation    takes Colace every other day   DDD (degenerative disc disease), lumbar    Diabetes mellitus without complication ([place])    per pt had been diagnosed but then "number started going down so my doctor said i didnt have diabetes"    Dry eyes    uses Systane Eye drops daily as needed   GERD (gastroesophageal reflux disease) 12/06/2011   takes Nexium daily   Headache(784.0)    occasionally-d/t congestion   Heart murmur    History of bronchitis 6-7 yrs ago   Hyperlipidemia    Hypertension    takes Metoprolol and Diovan daily   Hypothyroidism 12/06/2011   takes Synthroid daily   Joint pain    Multiple allergies    takes Zyrtec daily;uses Nasonex daily as needed   Nocturia    Numbness    weakness-right hand   OSA on CPAP    Pancreatitis    Peripheral edema    takes Furosemide daily   Shingles    Urinary frequency    Venous insufficiency of leg     Past Surgical History:  Procedure Laterality Date   ABDOMINAL HYSTERECTOMY     uterine prolapse   CARDIAC SURGERY     CHOLECYSTECTOMY     COLONOSCOPY     ESOPHAGOGASTRODUODENOSCOPY  5-68yr ago   FOOT SURGERY Bilateral    KNEE ARTHROSCOPY     LAPAROSCOPIC GASTRIC SLEEVE RESECTION N/A 08/23/2016   Procedure: LAPAROSCOPIC GASTRIC SLEEVE RESECTION WITH UPPER ENDO;  Surgeon: MJohnathan Hausen MD;  [facility];  Service: General;  Laterality: N/A;   POSTERIOR LUMBAR FUSION  03/10/2009   L3-4 and L3-4 fusion, Dr. Leeroy Cha   SHOULDER ARTHROSCOPY WITH ROTATOR CUFF REPAIR AND SUBACROMIAL DECOMPRESSION Right 08/23/2013   Procedure: RIGHT SHOULDER ARTHROSCOPY WITH  SUBACROMIAL DECOMPRESSION DISTAL CLAVICLE RESECTION AND  ROTATOR CUFF REPAIR ;  Surgeon: Marin Shutter, MD;  Location: [facility];  Service: Orthopedics;  Laterality: Right;   TUBAL LIGATION      Current Outpatient Medications  Medication Sig Dispense Refill   albuterol (PROVENTIL HFA;VENTOLIN HFA) 108 (90 BASE) MCG/ACT inhaler Inhale 1-2 puffs into the lungs every 6 (six) hours as needed for wheezing or shortness of breath. 1 Inhaler 2   Apoaequorin (PREVAGEN PO) Take by mouth daily.     aspirin EC 81 MG tablet Take 81 mg by mouth daily.      Azelastine HCl 137 MCG/SPRAY SOLN PLACE 1 SPRAY IN EACH  NOSTRIL ONCE NIGHTLY 30 mL 3   azithromycin (ZITHROMAX) 250 MG tablet Use as directed on package 6 tablet 0   baclofen (LIORESAL) 10 MG tablet TAKE 1/2-1 TABLET BY MOUTH EVERY 8 HOURS AS NEEDED FOR SPASMS 60 tablet 1   betamethasone valerate ointment (VALISONE) 0.1 % Apply a pea sized amount topically BID for up to 2 weeks. Not for daily long term use. 30 g 0   diazepam (VALIUM) 5 MG tablet Take 1 by mouth 1 hour  pre-procedure with very light food. May bring 2nd tablet to appointment. 2 tablet 0   diazepam (VALIUM) 5 MG tablet Take 1 tablet by mouth once daily as needed for neck pain 30 tablet 3   esomeprazole (NEXIUM) 40 MG capsule Take 1 capsule (40 mg total) by mouth daily. 90 capsule 3   fluticasone (FLONASE) 50 MCG/ACT nasal spray PLACE 2 SPRAYS IN EACH NOSTRIL DAILY AS DIRECTED 16 g 3   fluticasone (FLONASE) 50 MCG/ACT nasal spray Place 1 spray in each nostril once a day 48 g 4   furosemide (LASIX) 20 MG tablet Take 3 tablets (60 mg total) by mouth as needed. NEED OV. 90 tablet 3   gabapentin (NEURONTIN) 300 MG capsule TAKE 1 CAPSULE BY MOUTH 3 TIMES DAILY 270 capsule 1   levothyroxine (SYNTHROID) 75 MCG tablet TAKE 1 TABLET BY MOUTH EVERY MORNING ON AN EMPTY STOMACH 90 tablet 2   lisinopril (ZESTRIL) 40 MG tablet TAKE 1 TABLET BY MOUTH ONCE A DAY 90 tablet 1   loratadine (CLARITIN) 10 MG tablet Take 10 mg by mouth daily.     magnesium oxide (MAG-OX) 400 MG tablet Take 400 mg by mouth daily.     meloxicam (MOBIC) 15 MG tablet TAKE 1 TABLET BY MOUTH ONCE DAILY 90 tablet 0   methocarbamol (ROBAXIN) 500 MG tablet TAKE 1 TABLET BY MOUTH EVERY 8 HOURS AS NEEDED FOR MUSCLE SPASMS 60 tablet 3   metoprolol tartrate (LOPRESSOR) 25 MG tablet TAKE 1 TABLET BY MOUTH 2 TIMES DAILY 180 tablet 3   metoprolol tartrate (LOPRESSOR) 25 MG tablet TAKE 1 TABLET BY MOUTH 2 TIMES DAILY 180 tablet 1   molnupiravir EUA (LAGEVRIO) 200 MG CAPS capsule Take 4 capsules by mouth every 12 hours for 5 days 40 capsule 0    nystatin cream (MYCOSTATIN) Apply to affected area 2 times a day for up to 7 days. 30 g 1   ofloxacin (OCUFLOX) 0.3 % ophthalmic solution Place 1 drop into left eye 3 times a day for 1 week, then 1 drop twice a day x 1 week then 1 drop daily x 14 days (starting day  after surgery) 5 mL 0   olmesartan (BENICAR) 20 MG tablet Take 1 tablet (20 mg total) by mouth daily. 90 tablet 3   Omega-3 Fatty Acids (FISH OIL) 1000 MG CAPS Take 1,000 mg by mouth daily.     potassium chloride SA (K-DUR) 20 MEQ tablet TAKE 1 TABLET (20 MEQ TOTAL) BY MOUTH DAILY. 90 tablet 1   prednisoLONE acetate (PRED FORTE) 1 % ophthalmic suspension Place 1 drop into left eye 3 times a day for 1 week, then 1 drop twice a day for 1 week, then 1 drop once a day for 14 days (starting the day after surgery) 5 mL 0   TURMERIC CURCUMIN PO Take by mouth daily.     vitamin C (ASCORBIC ACID) 500 MG tablet Take 500 mg by mouth daily.     VITAMIN D PO Take by mouth.     No current facility-administered medications for this visit.    Family History  Problem Relation Age of Onset   Hypertension Mother    Hypertension Father    Hypertension Sister    Kidney disease Sister        Scheryl Darter TRANSPLANT   Thyroid disease Sister    Thyroid disease Sister    Hypertension Sister    Breast cancer Maternal Aunt        >50; passed away from it   Breast cancer Maternal Aunt        >50    Review of Systems  All other systems reviewed and are negative.   Exam:   There were no vitals taken for this visit.  Weight change: '@WEIGHTCHANGE'$ @ Height:      Ht Readings from Last 3 Encounters:  08/18/21 '5\' 3"'$  (1.6 m)  06/16/21 '5\' 3"'$  (1.6 m)  04/15/21 '5\' 3"'$  (1.6 m)    General appearance: alert, cooperative and appears stated age Head: Normocephalic, without obvious abnormality, atraumatic Neck: no adenopathy, supple, symmetrical, trachea midline and thyroid normal to inspection and palpation Lungs: clear to auscultation bilaterally Cardiovascular:  regular rate and rhythm Breasts: normal appearance, no masses or tenderness Abdomen: soft, non-tender; non distended,  no masses,  no organomegaly Extremities: extremities normal, atraumatic, no cyanosis or edema Skin: Skin color, texture, turgor normal. No rashes or lesions Lymph nodes: Cervical, supraclavicular, and axillary nodes normal. No abnormal inguinal nodes palpated Neurologic: Grossly normal   Pelvic: External genitalia:  she has vitiligo on the lower vulva and perianal region. On the outer edge of the vitiligo bilaterally is thick whitening of the skin (area of itching), small ulceration on the left (suspect from scratching).              Urethra:  normal appearing urethra with no masses, tenderness or lesions              Bartholins and Skenes: normal                 Vagina: mildly atrophic appearing vagina with a small amount of white vaginal discharge, grade 2 cystocele.              Cervix: absent               Bimanual Exam:  Uterus:  uterus absent              Adnexa: no mass, fullness, tenderness               Rectovaginal: Confirms  Anus:  normal sphincter tone, no lesions  Gae Dry, CMA chaperoned for the exam.   1. Well woman exam Discussed breast self exam Discussed calcium and vit D intake Mammogram next month Colonoscopy scheduled Patient reports normal DEXA Labs with primary  2. Cystocele, midline Not bothersome  3. Chronic vulvitis - WET PREP FOR TRICH, YEAST, CLUE - clobetasol ointment (TEMOVATE) 0.05 %; Apply 1 Application topically 2 (two) times daily. Apply as directed twice daily for 2 weeks  Dispense: 30 g; Refill: 0 - SureSwab Advanced Vaginitis, TMA -f/u in 2 weeks

## 2021-12-25 ENCOUNTER — Other Ambulatory Visit (HOSPITAL_COMMUNITY): Payer: Self-pay

## 2021-12-25 MED ORDER — METOPROLOL TARTRATE 25 MG PO TABS
25.0000 mg | ORAL_TABLET | Freq: Two times a day (BID) | ORAL | 1 refills | Status: DC
  Filled 2021-12-25 – 2022-01-04 (×2): qty 180, 90d supply, fill #0
  Filled 2022-04-02: qty 180, 90d supply, fill #1

## 2021-12-26 ENCOUNTER — Other Ambulatory Visit (HOSPITAL_COMMUNITY): Payer: Self-pay

## 2021-12-28 ENCOUNTER — Ambulatory Visit (INDEPENDENT_AMBULATORY_CARE_PROVIDER_SITE_OTHER): Payer: 59 | Admitting: Obstetrics and Gynecology

## 2021-12-28 ENCOUNTER — Other Ambulatory Visit (HOSPITAL_COMMUNITY): Payer: Self-pay

## 2021-12-28 ENCOUNTER — Encounter: Payer: Self-pay | Admitting: Obstetrics and Gynecology

## 2021-12-28 VITALS — BP 135/80 | Ht 62.0 in | Wt 222.0 lb

## 2021-12-28 DIAGNOSIS — Z01419 Encounter for gynecological examination (general) (routine) without abnormal findings: Secondary | ICD-10-CM

## 2021-12-28 DIAGNOSIS — N8111 Cystocele, midline: Secondary | ICD-10-CM

## 2021-12-28 DIAGNOSIS — N763 Subacute and chronic vulvitis: Secondary | ICD-10-CM

## 2021-12-28 LAB — WET PREP FOR TRICH, YEAST, CLUE

## 2021-12-28 MED ORDER — CLOBETASOL PROPIONATE 0.05 % EX OINT
1.0000 | TOPICAL_OINTMENT | Freq: Two times a day (BID) | CUTANEOUS | 0 refills | Status: DC
  Filled 2021-12-28: qty 30, 30d supply, fill #0

## 2021-12-28 NOTE — Patient Instructions (Signed)

## 2021-12-29 ENCOUNTER — Other Ambulatory Visit: Payer: Self-pay | Admitting: Orthopaedic Surgery

## 2021-12-29 ENCOUNTER — Other Ambulatory Visit (HOSPITAL_COMMUNITY): Payer: Self-pay

## 2021-12-29 LAB — SURESWAB® ADVANCED VAGINITIS,TMA
CANDIDA SPECIES: NOT DETECTED
Candida glabrata: NOT DETECTED
SURESWAB(R) ADV BACTERIAL VAGINOSIS(BV),TMA: NEGATIVE
TRICHOMONAS VAGINALIS (TV),TMA: NOT DETECTED

## 2021-12-30 ENCOUNTER — Other Ambulatory Visit (HOSPITAL_COMMUNITY): Payer: Self-pay

## 2021-12-30 MED ORDER — BACLOFEN 10 MG PO TABS
5.0000 mg | ORAL_TABLET | Freq: Three times a day (TID) | ORAL | 1 refills | Status: DC | PRN
  Filled 2021-12-30: qty 60, 20d supply, fill #0
  Filled 2022-02-03: qty 60, 20d supply, fill #1

## 2022-01-04 ENCOUNTER — Other Ambulatory Visit (HOSPITAL_COMMUNITY): Payer: Self-pay

## 2022-01-04 ENCOUNTER — Encounter: Payer: Self-pay | Admitting: Obstetrics and Gynecology

## 2022-01-05 ENCOUNTER — Other Ambulatory Visit (HOSPITAL_COMMUNITY): Payer: Self-pay

## 2022-01-06 ENCOUNTER — Other Ambulatory Visit (HOSPITAL_COMMUNITY): Payer: Self-pay

## 2022-01-07 ENCOUNTER — Other Ambulatory Visit (HOSPITAL_COMMUNITY): Payer: Self-pay

## 2022-01-11 ENCOUNTER — Other Ambulatory Visit (HOSPITAL_COMMUNITY): Payer: Self-pay

## 2022-01-11 MED ORDER — AZELASTINE HCL 0.1 % NA SOLN
NASAL | 4 refills | Status: DC
  Filled 2022-01-11: qty 30, 90d supply, fill #0
  Filled 2022-04-06: qty 30, 90d supply, fill #1
  Filled 2022-07-12: qty 30, 90d supply, fill #2

## 2022-01-12 ENCOUNTER — Other Ambulatory Visit (HOSPITAL_COMMUNITY): Payer: Self-pay

## 2022-01-12 ENCOUNTER — Other Ambulatory Visit: Payer: Self-pay

## 2022-01-14 ENCOUNTER — Other Ambulatory Visit (HOSPITAL_COMMUNITY): Payer: Self-pay

## 2022-01-30 ENCOUNTER — Other Ambulatory Visit (HOSPITAL_COMMUNITY): Payer: Self-pay

## 2022-02-03 ENCOUNTER — Other Ambulatory Visit: Payer: Self-pay

## 2022-02-03 ENCOUNTER — Other Ambulatory Visit (HOSPITAL_COMMUNITY): Payer: Self-pay

## 2022-02-04 ENCOUNTER — Other Ambulatory Visit (HOSPITAL_COMMUNITY): Payer: Self-pay

## 2022-02-04 DIAGNOSIS — Z1211 Encounter for screening for malignant neoplasm of colon: Secondary | ICD-10-CM | POA: Diagnosis not present

## 2022-02-04 MED ORDER — OLMESARTAN MEDOXOMIL 20 MG PO TABS
20.0000 mg | ORAL_TABLET | Freq: Every day | ORAL | 3 refills | Status: DC
  Filled 2022-02-04: qty 90, 90d supply, fill #0
  Filled 2022-03-02: qty 90, 90d supply, fill #1

## 2022-02-04 MED ORDER — DIAZEPAM 5 MG PO TABS
5.0000 mg | ORAL_TABLET | Freq: Every day | ORAL | 0 refills | Status: DC | PRN
  Filled 2022-02-04: qty 30, 30d supply, fill #0

## 2022-02-05 ENCOUNTER — Other Ambulatory Visit (HOSPITAL_COMMUNITY): Payer: Self-pay

## 2022-02-05 ENCOUNTER — Other Ambulatory Visit: Payer: Self-pay | Admitting: Physical Medicine and Rehabilitation

## 2022-02-07 DIAGNOSIS — H524 Presbyopia: Secondary | ICD-10-CM | POA: Diagnosis not present

## 2022-02-08 ENCOUNTER — Other Ambulatory Visit (HOSPITAL_COMMUNITY): Payer: Self-pay

## 2022-02-08 MED ORDER — METHOCARBAMOL 500 MG PO TABS
ORAL_TABLET | Freq: Three times a day (TID) | ORAL | 3 refills | Status: DC | PRN
  Filled 2022-02-08 – 2022-03-02 (×2): qty 60, 20d supply, fill #0
  Filled 2022-05-31: qty 60, 20d supply, fill #1
  Filled 2022-08-06 (×2): qty 60, 20d supply, fill #2

## 2022-02-10 ENCOUNTER — Ambulatory Visit (INDEPENDENT_AMBULATORY_CARE_PROVIDER_SITE_OTHER): Payer: Commercial Managed Care - PPO | Admitting: Obstetrics and Gynecology

## 2022-02-10 ENCOUNTER — Encounter: Payer: Self-pay | Admitting: Obstetrics and Gynecology

## 2022-02-10 VITALS — BP 142/80 | HR 68 | Ht 62.0 in | Wt 223.0 lb

## 2022-02-10 DIAGNOSIS — N9089 Other specified noninflammatory disorders of vulva and perineum: Secondary | ICD-10-CM

## 2022-02-10 NOTE — Progress Notes (Signed)
GYNECOLOGY  VISIT   HPI: 62 y.o.   Divorced Black or [place] American Not Hispanic or Latino  female   [phone] with No LMP recorded. Patient has had a hysterectomy.   here for  follow up vulvar irritation/skin changes. She had a negative vaginitis panel. She was treated with steroid ointment and is feeling much better.   GYNECOLOGIC HISTORY: No LMP recorded. Patient has had a hysterectomy. 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 08/23/2016   Prediabetes 12/03/2015   OSA on CPAP 02/19/2015   Lung field abnormal 11/11/2014   Morbid obesity ([place]) 10/09/2014   Constipation 06/14/2014   Peripheral venous insufficiency 05/25/2013   Pulmonary HTN ([place]) 05/12/2013   Bilateral lower extremity edema 05/12/2013   Morbid obesity with BMI of 50.0-59.9, adult ([place])    Shoulder joint pain 03/28/2013   Abnormal glucose level 02/20/2013   Alopecia 02/20/2013   Degeneration of lumbar intervertebral disc 02/20/2013   Hyperglycemia due to type 2 diabetes mellitus ([place]) 02/20/2013   Type 2 diabetes mellitus with other specified complication ([place]) 72/53/6644   Back pain, hx of prior back surgery 12/09/2011   Pancreatitis, mild, CT negative 12/08/2011   Fatty liver 12/08/2011   Substernal chest pain, suspected secondary to acute pancreatitis 12/06/2011   Hypertension  12/06/2011   Hypokalemia 12/06/2011   Hypothyroidism 12/06/2011   Anxiety 12/06/2011   BV (bacterial vaginosis)    Hyperlipidemia     Past Medical History:  Diagnosis Date   Anemia when she was young   Arthritis    Congestion of nasal sinus    Constipation    takes Colace every other day   DDD (degenerative disc disease), lumbar    Diabetes mellitus without complication ([place])    per pt had been diagnosed but then "number started going down so my doctor said i didnt have diabetes"   Dry eyes    uses Systane Eye drops daily as needed   GERD (gastroesophageal reflux disease) 12/06/2011   takes Nexium daily   Headache(784.0)    occasionally-d/t congestion   Heart murmur    History of bronchitis 6-7 yrs ago   Hyperlipidemia    Hypertension    takes Metoprolol and Diovan daily   Hypothyroidism 12/06/2011   takes Synthroid daily   Joint pain    Multiple allergies    takes Zyrtec daily;uses Nasonex daily as needed   Nocturia    Numbness    weakness-right hand   OSA on CPAP    Pancreatitis    Peripheral edema    takes Furosemide daily   Shingles  Urinary frequency    Venous insufficiency of leg     Past Surgical History:  Procedure Laterality Date   ABDOMINAL HYSTERECTOMY     uterine prolapse   CARDIAC SURGERY     CHOLECYSTECTOMY     COLONOSCOPY     ESOPHAGOGASTRODUODENOSCOPY  5-52yr ago   FOOT SURGERY Bilateral    KNEE ARTHROSCOPY     LAPAROSCOPIC GASTRIC SLEEVE RESECTION N/A 08/23/2016   Procedure: LAPAROSCOPIC GASTRIC SLEEVE RESECTION WITH UPPER ENDO;  Surgeon: MJohnathan Hausen MD;  Location: [facility];  Service: General;  Laterality: N/A;   POSTERIOR LUMBAR FUSION  03/10/2009   L3-4 and L3-4 fusion, Dr. ELeeroy Cha  SHOULDER ARTHROSCOPY WITH ROTATOR CUFF REPAIR AND SUBACROMIAL DECOMPRESSION Right 08/23/2013   Procedure: RIGHT SHOULDER ARTHROSCOPY WITH  SUBACROMIAL DECOMPRESSION DISTAL CLAVICLE RESECTION AND  ROTATOR CUFF REPAIR ;  Surgeon: KMarin Shutter MD;   Location: [facility]  Service: Orthopedics;  Laterality: Right;   TUBAL LIGATION      Current Outpatient Medications  Medication Sig Dispense Refill   albuterol (PROVENTIL HFA;VENTOLIN HFA) 108 (90 BASE) MCG/ACT inhaler Inhale 1-2 puffs into the lungs every 6 (six) hours as needed for wheezing or shortness of breath. 1 Inhaler 2   aspirin EC 81 MG tablet Take 81 mg by mouth daily.      azelastine (ASTELIN) 0.1 % nasal spray PLACE 1 SPRAY PER NOSTRIL ONCE DAILY AT NIGHT 30 mL 4   Azelastine HCl 137 MCG/SPRAY SOLN PLACE 1 SPRAY IN EACH NOSTRIL ONCE NIGHTLY 30 mL 3   baclofen (LIORESAL) 10 MG tablet Take 0.5-1 tablets (5-10 mg total) by mouth every 8 (eight) hours as needed for muscle spasms 60 tablet 1   clobetasol ointment (TEMOVATE) 0.05 % Apply as directed twice daily for 2 weeks 30 g 0   diazepam (VALIUM) 5 MG tablet Take 1 tablet by mouth once daily as needed for neck pain 30 tablet 3   esomeprazole (NEXIUM) 40 MG capsule Take 1 capsule (40 mg total) by mouth daily. 90 capsule 3   fluticasone (FLONASE) 50 MCG/ACT nasal spray Place 1 spray in each nostril once a day 48 g 4   furosemide (LASIX) 20 MG tablet Take 3 tablets (60 mg total) by mouth as needed. NEED OV. 90 tablet 3   gabapentin (NEURONTIN) 300 MG capsule TAKE 1 CAPSULE BY MOUTH 3 TIMES DAILY 270 capsule 1   levothyroxine (SYNTHROID) 75 MCG tablet TAKE 1 TABLET BY MOUTH EVERY MORNING ON AN EMPTY STOMACH 90 tablet 2   loratadine (CLARITIN) 10 MG tablet Take 10 mg by mouth daily.     meloxicam (MOBIC) 15 MG tablet TAKE 1 TABLET BY MOUTH ONCE DAILY 90 tablet 0   methocarbamol (ROBAXIN) 500 MG tablet TAKE 1 TABLET BY MOUTH EVERY 8 HOURS AS NEEDED FOR MUSCLE SPASMS 60 tablet 3   metoprolol tartrate (LOPRESSOR) 25 MG tablet Take 1 tablet (25 mg total) by mouth 2 (two) times daily. 180 tablet 1   nystatin cream (MYCOSTATIN) Apply to affected area 2 times a day for up to 7 days. 30 g 1   ofloxacin (OCUFLOX) 0.3 % ophthalmic solution Place 1  drop into left eye 3 times a day for 1 week, then 1 drop twice a day x 1 week then 1 drop daily x 14 days (starting day after surgery) 5 mL 0   olmesartan (BENICAR) 20 MG tablet Take 1 tablet (20 mg total) by mouth daily. 90 tablet 3   Omega-3 Fatty Acids (  FISH OIL) 1000 MG CAPS Take 1,000 mg by mouth daily.     prednisoLONE acetate (PRED FORTE) 1 % ophthalmic suspension Place 1 drop into left eye 3 times a day for 1 week, then 1 drop twice a day for 1 week, then 1 drop once a day for 14 days (starting the day after surgery) 5 mL 0   TURMERIC CURCUMIN PO Take by mouth daily.     vitamin C (ASCORBIC ACID) 500 MG tablet Take 500 mg by mouth daily.     VITAMIN D PO Take by mouth.     fluticasone (FLONASE) 50 MCG/ACT nasal spray PLACE 2 SPRAYS IN EACH NOSTRIL DAILY AS DIRECTED 16 g 3   lisinopril (ZESTRIL) 40 MG tablet TAKE 1 TABLET BY MOUTH ONCE A DAY 90 tablet 1   potassium chloride SA (K-DUR) 20 MEQ tablet TAKE 1 TABLET (20 MEQ TOTAL) BY MOUTH DAILY. 90 tablet 1   No current facility-administered medications for this visit.     ALLERGIES: Metronidazole, Adhesive [tape], Latex, Penicillins, and Vesicare [solifenacin]  Family History  Problem Relation Age of Onset   Hypertension Mother    Hypertension Father    Hypertension Sister    Kidney disease Sister        KIDNEL TRANSPLANT   Thyroid disease Sister    Thyroid disease Sister    Hypertension Sister    Breast cancer Maternal Aunt        >50; passed away from it   Breast cancer Maternal Aunt        >50    Social History   Socioeconomic History   Marital status: Divorced    Spouse name: Not on file   Number of children: 2   Years of education: College   Highest education level: Not on file  Occupational History    Employer: [employer]    Comment: [employer]  Tobacco Use   Smoking status: Former    Packs/day: 0.75    Years: 40.00    Total pack years: 30.00    Types: Cigarettes    Quit date: 1993    Years since quitting: 31.0    Smokeless tobacco: Never   Tobacco comments:    quit smoking in 1993  Vaping Use   Vaping Use: Never used  Substance and Sexual Activity   Alcohol use: Yes    Alcohol/week: 0.0 standard drinks of alcohol    Comment: rarely;; mixed drink    Drug use: No   Sexual activity: Not Currently    Partners: Male    Birth control/protection: Surgical    Comment: Hysterectomy  Other Topics Concern   Not on file  Social History Narrative   Caffeine: Coffee   Social Determinants of Health   Financial Resource Strain: Not on file  Food Insecurity: Not on file  Transportation Needs: Not on file  Physical Activity: Not on file  Stress: Not on file  Social Connections: Not on file  Intimate Partner Violence: Not on file    Review of Systems  All other systems reviewed and are negative.   PHYSICAL EXAMINATION:    BP (!) 142/80 (BP Location: Right Arm, Patient Position: Sitting, Cuff Size: Large)   Pulse 68   Ht '5\' 2"'$  (1.575 m)   Wt 223 lb (101.2 kg)   BMI 40.79 kg/m     General appearance: alert, cooperative and appears stated age  Pelvic: External genitalia:  no lesions, she has vitiligo bilaterally on the lower vulva and perianal  area, prior area of thickening and ulceration has resolved.               Urethra:  normal appearing urethra with no masses, tenderness or lesions              Bartholins and Skenes: normal                   Chaperone was present for exam.  1. Vulvar irritation Resolved with steroid ointment. Discussed vulvar skin care Routine f/u

## 2022-02-16 ENCOUNTER — Other Ambulatory Visit (HOSPITAL_COMMUNITY): Payer: Self-pay

## 2022-02-18 ENCOUNTER — Other Ambulatory Visit (HOSPITAL_COMMUNITY): Payer: Self-pay

## 2022-02-18 ENCOUNTER — Other Ambulatory Visit: Payer: Self-pay | Admitting: Physical Medicine and Rehabilitation

## 2022-02-18 MED ORDER — GABAPENTIN 300 MG PO CAPS
300.0000 mg | ORAL_CAPSULE | Freq: Three times a day (TID) | ORAL | 1 refills | Status: DC
  Filled 2022-02-18 – 2022-05-31 (×2): qty 270, 90d supply, fill #0
  Filled 2022-07-07 – 2022-11-03 (×2): qty 270, 90d supply, fill #1

## 2022-03-02 ENCOUNTER — Other Ambulatory Visit (HOSPITAL_COMMUNITY): Payer: Self-pay

## 2022-03-02 ENCOUNTER — Other Ambulatory Visit: Payer: Self-pay | Admitting: Orthopaedic Surgery

## 2022-03-03 ENCOUNTER — Other Ambulatory Visit (HOSPITAL_COMMUNITY): Payer: Self-pay

## 2022-03-03 ENCOUNTER — Other Ambulatory Visit: Payer: Self-pay

## 2022-03-03 MED ORDER — BACLOFEN 10 MG PO TABS
5.0000 mg | ORAL_TABLET | Freq: Three times a day (TID) | ORAL | 1 refills | Status: DC | PRN
  Filled 2022-03-03: qty 60, 20d supply, fill #0
  Filled 2022-05-31: qty 60, 20d supply, fill #1

## 2022-03-04 ENCOUNTER — Other Ambulatory Visit (HOSPITAL_COMMUNITY): Payer: Self-pay

## 2022-03-04 MED ORDER — MELOXICAM 15 MG PO TABS
15.0000 mg | ORAL_TABLET | Freq: Every day | ORAL | 0 refills | Status: DC
  Filled 2022-03-04: qty 90, 90d supply, fill #0

## 2022-03-09 ENCOUNTER — Other Ambulatory Visit (HOSPITAL_COMMUNITY): Payer: Self-pay

## 2022-03-16 ENCOUNTER — Other Ambulatory Visit (HOSPITAL_COMMUNITY): Payer: Self-pay

## 2022-03-19 ENCOUNTER — Encounter (HOSPITAL_COMMUNITY): Payer: Self-pay | Admitting: *Deleted

## 2022-04-02 ENCOUNTER — Other Ambulatory Visit (HOSPITAL_COMMUNITY): Payer: Self-pay

## 2022-04-05 ENCOUNTER — Other Ambulatory Visit (HOSPITAL_COMMUNITY): Payer: Self-pay

## 2022-04-05 DIAGNOSIS — E1165 Type 2 diabetes mellitus with hyperglycemia: Secondary | ICD-10-CM | POA: Diagnosis not present

## 2022-04-05 DIAGNOSIS — E785 Hyperlipidemia, unspecified: Secondary | ICD-10-CM | POA: Diagnosis not present

## 2022-04-05 DIAGNOSIS — I10 Essential (primary) hypertension: Secondary | ICD-10-CM | POA: Diagnosis not present

## 2022-04-05 DIAGNOSIS — R4189 Other symptoms and signs involving cognitive functions and awareness: Secondary | ICD-10-CM | POA: Diagnosis not present

## 2022-04-05 MED ORDER — ESOMEPRAZOLE MAGNESIUM 40 MG PO CPDR
40.0000 mg | DELAYED_RELEASE_CAPSULE | Freq: Every day | ORAL | 3 refills | Status: DC
  Filled 2022-05-12: qty 90, 90d supply, fill #0
  Filled 2022-08-06 (×2): qty 90, 90d supply, fill #1
  Filled 2022-11-08: qty 90, 90d supply, fill #2
  Filled 2023-02-10: qty 90, 90d supply, fill #3

## 2022-04-05 MED ORDER — LEVOTHYROXINE SODIUM 75 MCG PO TABS
75.0000 ug | ORAL_TABLET | Freq: Every morning | ORAL | 2 refills | Status: DC
  Filled 2022-04-05: qty 90, 90d supply, fill #0
  Filled 2022-07-07: qty 90, 90d supply, fill #1
  Filled 2022-09-24 – 2022-11-03 (×4): qty 90, 90d supply, fill #2

## 2022-04-05 MED ORDER — OLMESARTAN MEDOXOMIL 20 MG PO TABS
20.0000 mg | ORAL_TABLET | Freq: Every day | ORAL | 3 refills | Status: DC
  Filled 2022-05-12: qty 90, 90d supply, fill #0
  Filled 2022-08-06: qty 90, 90d supply, fill #1
  Filled 2022-12-16 – 2023-01-01 (×2): qty 90, 90d supply, fill #2

## 2022-04-05 MED ORDER — METOPROLOL TARTRATE 25 MG PO TABS
25.0000 mg | ORAL_TABLET | Freq: Two times a day (BID) | ORAL | 1 refills | Status: DC
  Filled 2022-04-05 – 2022-05-12 (×2): qty 180, 90d supply, fill #0

## 2022-04-06 ENCOUNTER — Other Ambulatory Visit (HOSPITAL_COMMUNITY): Payer: Self-pay

## 2022-04-12 ENCOUNTER — Other Ambulatory Visit (HOSPITAL_COMMUNITY): Payer: Self-pay

## 2022-04-12 DIAGNOSIS — K219 Gastro-esophageal reflux disease without esophagitis: Secondary | ICD-10-CM | POA: Diagnosis not present

## 2022-04-12 DIAGNOSIS — G4733 Obstructive sleep apnea (adult) (pediatric): Secondary | ICD-10-CM | POA: Diagnosis not present

## 2022-04-12 DIAGNOSIS — Z1152 Encounter for screening for COVID-19: Secondary | ICD-10-CM | POA: Diagnosis not present

## 2022-04-12 DIAGNOSIS — E1169 Type 2 diabetes mellitus with other specified complication: Secondary | ICD-10-CM | POA: Diagnosis not present

## 2022-04-12 DIAGNOSIS — R051 Acute cough: Secondary | ICD-10-CM | POA: Diagnosis not present

## 2022-04-12 DIAGNOSIS — J040 Acute laryngitis: Secondary | ICD-10-CM | POA: Diagnosis not present

## 2022-04-12 DIAGNOSIS — J399 Disease of upper respiratory tract, unspecified: Secondary | ICD-10-CM | POA: Diagnosis not present

## 2022-04-12 MED ORDER — PREDNISONE 10 MG PO TABS
10.0000 mg | ORAL_TABLET | Freq: Two times a day (BID) | ORAL | 0 refills | Status: AC
  Filled 2022-04-12: qty 10, 5d supply, fill #0

## 2022-04-12 MED ORDER — AZITHROMYCIN 250 MG PO TABS
ORAL_TABLET | ORAL | 0 refills | Status: DC
  Filled 2022-04-12: qty 6, 5d supply, fill #0

## 2022-05-05 DIAGNOSIS — Z9884 Bariatric surgery status: Secondary | ICD-10-CM | POA: Diagnosis not present

## 2022-05-12 ENCOUNTER — Other Ambulatory Visit (HOSPITAL_COMMUNITY): Payer: Self-pay

## 2022-05-12 ENCOUNTER — Other Ambulatory Visit: Payer: Self-pay

## 2022-05-18 ENCOUNTER — Other Ambulatory Visit (HOSPITAL_COMMUNITY): Payer: Self-pay

## 2022-05-18 DIAGNOSIS — R350 Frequency of micturition: Secondary | ICD-10-CM | POA: Diagnosis not present

## 2022-05-18 DIAGNOSIS — R4189 Other symptoms and signs involving cognitive functions and awareness: Secondary | ICD-10-CM | POA: Diagnosis not present

## 2022-05-18 DIAGNOSIS — M546 Pain in thoracic spine: Secondary | ICD-10-CM | POA: Diagnosis not present

## 2022-05-18 DIAGNOSIS — M5416 Radiculopathy, lumbar region: Secondary | ICD-10-CM | POA: Diagnosis not present

## 2022-05-18 MED ORDER — PREDNISONE 10 MG (21) PO TBPK
ORAL_TABLET | ORAL | 0 refills | Status: DC
  Filled 2022-05-18: qty 21, 6d supply, fill #0

## 2022-05-26 ENCOUNTER — Telehealth: Payer: Self-pay | Admitting: Neurology

## 2022-05-26 NOTE — Telephone Encounter (Addendum)
I called pt she is needing new type of mask to use since what she is using now causes burning after using for sometime.  So she is not consistent.  Last seen 11/2020.  Will need new appt for supplies.  Has appt 08-16-2022 for cognitive impairment.  I relayed that osa can cause issues with all body systems even memory/ brain fog.  I will place on waitlist to see if can be seen sooner.  Pt verbalized understanding.

## 2022-05-26 NOTE — Telephone Encounter (Signed)
Pt said she wants to talk to nurse about getting a new mask ordered.

## 2022-05-31 ENCOUNTER — Other Ambulatory Visit (HOSPITAL_COMMUNITY): Payer: Self-pay

## 2022-05-31 ENCOUNTER — Other Ambulatory Visit: Payer: Self-pay

## 2022-06-01 ENCOUNTER — Other Ambulatory Visit (HOSPITAL_COMMUNITY): Payer: Self-pay

## 2022-06-01 MED ORDER — MELOXICAM 15 MG PO TABS
15.0000 mg | ORAL_TABLET | Freq: Every day | ORAL | 0 refills | Status: DC
  Filled 2022-06-01 – 2022-06-09 (×2): qty 90, 90d supply, fill #0

## 2022-06-08 ENCOUNTER — Other Ambulatory Visit (HOSPITAL_COMMUNITY): Payer: Self-pay

## 2022-06-09 ENCOUNTER — Other Ambulatory Visit (HOSPITAL_COMMUNITY): Payer: Self-pay

## 2022-07-01 ENCOUNTER — Other Ambulatory Visit (HOSPITAL_COMMUNITY): Payer: Self-pay

## 2022-07-01 MED ORDER — PAXLOVID (300/100) 20 X 150 MG & 10 X 100MG PO TBPK
ORAL_TABLET | ORAL | 0 refills | Status: DC
  Filled 2022-07-01: qty 30, 5d supply, fill #0

## 2022-07-02 ENCOUNTER — Other Ambulatory Visit (HOSPITAL_COMMUNITY): Payer: Self-pay

## 2022-07-07 ENCOUNTER — Other Ambulatory Visit (HOSPITAL_COMMUNITY): Payer: Self-pay

## 2022-07-08 ENCOUNTER — Other Ambulatory Visit (HOSPITAL_COMMUNITY): Payer: Self-pay

## 2022-07-08 ENCOUNTER — Other Ambulatory Visit: Payer: Self-pay

## 2022-07-08 MED ORDER — MELOXICAM 15 MG PO TABS
15.0000 mg | ORAL_TABLET | Freq: Every day | ORAL | 3 refills | Status: DC
  Filled 2022-07-08 – 2022-09-25 (×3): qty 90, 90d supply, fill #0

## 2022-07-08 MED ORDER — METOPROLOL TARTRATE 25 MG PO TABS
25.0000 mg | ORAL_TABLET | Freq: Two times a day (BID) | ORAL | 1 refills | Status: DC
  Filled 2022-07-08: qty 180, 90d supply, fill #0

## 2022-07-12 ENCOUNTER — Other Ambulatory Visit (HOSPITAL_COMMUNITY): Payer: Self-pay

## 2022-07-14 ENCOUNTER — Other Ambulatory Visit (HOSPITAL_COMMUNITY): Payer: Self-pay

## 2022-07-20 ENCOUNTER — Other Ambulatory Visit (HOSPITAL_BASED_OUTPATIENT_CLINIC_OR_DEPARTMENT_OTHER): Payer: Self-pay

## 2022-07-24 ENCOUNTER — Other Ambulatory Visit (HOSPITAL_COMMUNITY): Payer: Self-pay

## 2022-07-28 ENCOUNTER — Other Ambulatory Visit (HOSPITAL_COMMUNITY): Payer: Self-pay

## 2022-08-04 ENCOUNTER — Ambulatory Visit (INDEPENDENT_AMBULATORY_CARE_PROVIDER_SITE_OTHER): Payer: Commercial Managed Care - PPO | Admitting: Student

## 2022-08-04 DIAGNOSIS — M25562 Pain in left knee: Secondary | ICD-10-CM

## 2022-08-04 DIAGNOSIS — M170 Bilateral primary osteoarthritis of knee: Secondary | ICD-10-CM | POA: Diagnosis not present

## 2022-08-04 DIAGNOSIS — M25561 Pain in right knee: Secondary | ICD-10-CM

## 2022-08-04 MED ORDER — LIDOCAINE HCL 1 % IJ SOLN
4.0000 mL | INTRAMUSCULAR | Status: AC | PRN
Start: 2022-08-04 — End: 2022-08-04
  Administered 2022-08-04: 4 mL

## 2022-08-04 MED ORDER — TRIAMCINOLONE ACETONIDE 40 MG/ML IJ SUSP
2.0000 mL | INTRAMUSCULAR | Status: AC | PRN
Start: 2022-08-04 — End: 2022-08-04
  Administered 2022-08-04: 2 mL via INTRA_ARTICULAR

## 2022-08-04 NOTE — Progress Notes (Signed)
Chief Complaint: Bilateral knee pain     History of Present Illness:    [name] is a 62 y.o. female presenting today for evaluation of bilateral knee pain.  She does have known osteoarthritis of both knees.  She is a Dr. Ophelia Charter patient and who she last saw on 08/18/2021 and received bilateral cortisone injections.  These did help for some time, however her pain is returned and she has been dealing with this for the past few months.  States that the pain is worse in the right knee than the left.  Pain fluctuates in severity but does sometimes become severe.  She would like to repeat injections today.  Has considered proceeding with total knee arthroplasty however has not taken any action at this point.   PMH/PSH/Family History/Social History/Meds/Allergies:    Past Medical History:  Diagnosis Date   Anemia when she was young   Arthritis    Congestion of nasal sinus    Constipation    takes Colace every other day   DDD (degenerative disc disease), lumbar    Diabetes mellitus without complication ([facility])    per pt had been diagnosed but then "number started going down so my doctor said i didnt have diabetes"   Dry eyes    uses Systane Eye drops daily as needed   GERD (gastroesophageal reflux disease) 12/06/2011   takes Nexium daily   Headache(784.0)    occasionally-d/t congestion   Heart murmur    History of bronchitis 6-7 yrs ago   Hyperlipidemia    Hypertension    takes Metoprolol and Diovan daily   Hypothyroidism 12/06/2011   takes Synthroid daily   Joint pain    Multiple allergies    takes Zyrtec daily;uses Nasonex daily as needed   Nocturia    Numbness    weakness-right hand   OSA on CPAP    Pancreatitis    Peripheral edema    takes Furosemide daily   Shingles    Urinary frequency    Venous insufficiency of leg    Past Surgical History:  Procedure Laterality Date   ABDOMINAL HYSTERECTOMY     uterine prolapse   CARDIAC  SURGERY     CHOLECYSTECTOMY     COLONOSCOPY     ESOPHAGOGASTRODUODENOSCOPY  5-80yrs ago   FOOT SURGERY Bilateral    KNEE ARTHROSCOPY     LAPAROSCOPIC GASTRIC SLEEVE RESECTION N/A 08/23/2016   Procedure: LAPAROSCOPIC GASTRIC SLEEVE RESECTION WITH UPPER ENDO;  Surgeon: Luretha Murphy, MD;  Location: [facility];  Service: General;  Laterality: N/A;   POSTERIOR LUMBAR FUSION  03/10/2009   L3-4 and L3-4 fusion, Dr. Hilda Lias   SHOULDER ARTHROSCOPY WITH ROTATOR CUFF REPAIR AND SUBACROMIAL DECOMPRESSION Right 08/23/2013   Procedure: RIGHT SHOULDER ARTHROSCOPY WITH  SUBACROMIAL DECOMPRESSION DISTAL CLAVICLE RESECTION AND  ROTATOR CUFF REPAIR ;  Surgeon: Senaida Lange, MD;  Location: [facility];  Service: Orthopedics;  Laterality: Right;   TUBAL LIGATION     Social History   Socioeconomic History   Marital status: Divorced    Spouse name: Not on file   Number of children: 2   Years of education: College   Highest education level: Not on file  Occupational History    Employer: [employer]    Comment: [employer]  Tobacco Use   Smoking status:  Former    Packs/day: 0.75    Years: 40.00    Additional pack years: 0.00    Total pack years: 30.00    Types: Cigarettes    Quit date: 70    Years since quitting: 31.5   Smokeless tobacco: Never   Tobacco comments:    quit smoking in 1993  Vaping Use   Vaping Use: Never used  Substance and Sexual Activity   Alcohol use: Yes    Alcohol/week: 0.0 standard drinks of alcohol    Comment: rarely;; mixed drink    Drug use: No   Sexual activity: Not Currently    Partners: Male    Birth control/protection: Surgical    Comment: Hysterectomy  Other Topics Concern   Not on file  Social History Narrative   Caffeine: Coffee   Social Determinants of Health   Financial Resource Strain: Not on file  Food Insecurity: Not on file  Transportation Needs: Not on file  Physical Activity: Not on file  Stress: Not on file  Social Connections: Not on file    Family History  Problem Relation Age of Onset   Hypertension Mother    Hypertension Father    Hypertension Sister    Kidney disease Sister        Pablo Lawrence TRANSPLANT   Thyroid disease Sister    Thyroid disease Sister    Hypertension Sister    Breast cancer Maternal Aunt        >50; passed away from it   Breast cancer Maternal Aunt        >50   Allergies  Allergen Reactions   Metronidazole     Developed pancreatitis after taking this   Adhesive [Tape] Itching and Rash   Latex Itching and Rash   Penicillins Rash    Has patient had a PCN reaction causing immediate rash, facial/tongue/throat swelling, SOB or lightheadedness with hypotension: No Has patient had a PCN reaction causing severe rash involving mucus membranes or skin necrosis: No Has patient had a PCN reaction that required hospitalization No Has patient had a PCN reaction occurring within the last 10 years: No If all of the above answers are "NO", then may proceed with Cephalosporin use.   Vesicare [Solifenacin] Rash   Current Outpatient Medications  Medication Sig Dispense Refill   albuterol (PROVENTIL HFA;VENTOLIN HFA) 108 (90 BASE) MCG/ACT inhaler Inhale 1-2 puffs into the lungs every 6 (six) hours as needed for wheezing or shortness of breath. 1 Inhaler 2   aspirin EC 81 MG tablet Take 81 mg by mouth daily.      azelastine (ASTELIN) 0.1 % nasal spray PLACE 1 SPRAY PER NOSTRIL ONCE DAILY AT NIGHT 30 mL 4   Azelastine HCl 137 MCG/SPRAY SOLN PLACE 1 SPRAY IN EACH NOSTRIL ONCE NIGHTLY 30 mL 3   azithromycin (ZITHROMAX) 250 MG tablet Take 2 tablets by mouth today then take 1 tablet by mouth daily for 4 days 6 tablet 0   baclofen (LIORESAL) 10 MG tablet Take 0.5-1 tablets (5-10 mg total) by mouth every 8 (eight) hours as needed for muscle spasms 60 tablet 1   clobetasol ointment (TEMOVATE) 0.05 % Apply as directed twice daily for 2 weeks 30 g 0   diazepam (VALIUM) 5 MG tablet Take 1 tablet by mouth once daily as needed  for neck pain 30 tablet 3   esomeprazole (NEXIUM) 40 MG capsule Take 1 capsule (40 mg total) by mouth daily. 90 capsule 3   esomeprazole (NEXIUM) 40 MG capsule  Take 1 capsule (40 mg total) by mouth daily. 90 capsule 3   fluticasone (FLONASE) 50 MCG/ACT nasal spray PLACE 2 SPRAYS IN EACH NOSTRIL DAILY AS DIRECTED 16 g 3   fluticasone (FLONASE) 50 MCG/ACT nasal spray Place 1 spray in each nostril once a day 48 g 4   furosemide (LASIX) 20 MG tablet Take 3 tablets (60 mg total) by mouth as needed. NEED OV. 90 tablet 3   gabapentin (NEURONTIN) 300 MG capsule Take 1 capsule (300 mg total) by mouth 3 (three) times daily. 270 capsule 1   levothyroxine (SYNTHROID) 75 MCG tablet TAKE 1 TABLET BY MOUTH EVERY MORNING ON AN EMPTY STOMACH 90 tablet 2   levothyroxine (SYNTHROID) 75 MCG tablet Take 1 tablet (75 mcg total) by mouth every morning on an empty stomach. 90 tablet 2   lisinopril (ZESTRIL) 40 MG tablet TAKE 1 TABLET BY MOUTH ONCE A DAY 90 tablet 1   loratadine (CLARITIN) 10 MG tablet Take 10 mg by mouth daily.     meloxicam (MOBIC) 15 MG tablet Take 1 tablet (15 mg total) by mouth daily. 90 tablet 3   methocarbamol (ROBAXIN) 500 MG tablet TAKE 1 TABLET BY MOUTH EVERY 8 HOURS AS NEEDED FOR MUSCLE SPASMS 60 tablet 3   metoprolol tartrate (LOPRESSOR) 25 MG tablet TAKE 1 TABLET BY MOUTH 2 TIMES DAILY 180 tablet 1   metoprolol tartrate (LOPRESSOR) 25 MG tablet Take 1 tablet (25 mg total) by mouth 2 (two) times daily. 180 tablet 1   nirmatrelvir & ritonavir (PAXLOVID, 300/100,) 20 x 150 MG & 10 x 100MG  TBPK Take 3 tablets by mouth twice a day for 5 days. 30 tablet 0   nystatin cream (MYCOSTATIN) Apply to affected area 2 times a day for up to 7 days. 30 g 1   ofloxacin (OCUFLOX) 0.3 % ophthalmic solution Place 1 drop into left eye 3 times a day for 1 week, then 1 drop twice a day x 1 week then 1 drop daily x 14 days (starting day after surgery) 5 mL 0   olmesartan (BENICAR) 20 MG tablet Take 1 tablet (20 mg  total) by mouth daily. 90 tablet 3   olmesartan (BENICAR) 20 MG tablet Take 1 tablet (20 mg total) by mouth daily. 90 tablet 3   Omega-3 Fatty Acids (FISH OIL) 1000 MG CAPS Take 1,000 mg by mouth daily.     potassium chloride SA (K-DUR) 20 MEQ tablet TAKE 1 TABLET (20 MEQ TOTAL) BY MOUTH DAILY. 90 tablet 1   prednisoLONE acetate (PRED FORTE) 1 % ophthalmic suspension Place 1 drop into left eye 3 times a day for 1 week, then 1 drop twice a day for 1 week, then 1 drop once a day for 14 days (starting the day after surgery) 5 mL 0   predniSONE (STERAPRED UNI-PAK 21 TAB) 10 MG (21) TBPK tablet Take as directed per package instructions. 21 tablet 0   TURMERIC CURCUMIN PO Take by mouth daily.     vitamin C (ASCORBIC ACID) 500 MG tablet Take 500 mg by mouth daily.     VITAMIN D PO Take by mouth.     No current facility-administered medications for this visit.   No results found.  Review of Systems:   A ROS was performed including pertinent positives and negatives as documented in the HPI.  Physical Exam :   Constitutional: NAD and appears stated age Neurological: Alert and oriented Psych: Appropriate affect and cooperative There were no vitals taken for  this visit.   Comprehensive Musculoskeletal Exam:    Tenderness to palpation over medial and lateral joint lines bilaterally.  Crepitus in both knees with active flexion and extension.  No erythema or warmth.  Imaging:     Assessment:   62 y.o. female with bilateral knee osteoarthritis.  Right knee has been bothering her more than the left.  She did get good relief with last round of cortisone injection so I believe this would be appropriate to repeat today.  Cortisone injections were performed of bilateral knees under ultrasound guidance without any complication.  Discussed to continue following with Dr. Ophelia Charter for management.  Plan :    -Cortisone injections performed today of bilateral knees -Follow-up with Dr. Ophelia Charter as  needed      Procedure Note  Patient: [name]             Date of Birth: [birth year]           MRN: [id number]             Visit Date: 08/04/2022  Procedures: Visit Diagnoses:  1. Bilateral primary osteoarthritis of knee     Large Joint Inj: bilateral knee on 08/04/2022 4:09 PM Indications: pain Details: 22 G 1.5 in needle, ultrasound-guided anterolateral approach Medications (Right): 4 mL lidocaine 1 %; 2 mL triamcinolone acetonide 40 MG/ML Medications (Left): 4 mL lidocaine 1 %; 2 mL triamcinolone acetonide 40 MG/ML Outcome: tolerated well, no immediate complications Consent was given by the patient. Immediately prior to procedure a time out was called to verify the correct patient, procedure, equipment, support staff and site/side marked as required. Patient was prepped and draped in the usual sterile fashion.       I personally saw and evaluated the patient, and participated in the management and treatment plan.  Hazle Nordmann, PA-C Orthopedics  This document was dictated using Conservation officer, historic buildings. A reasonable attempt at proof reading has been made to minimize errors.

## 2022-08-06 ENCOUNTER — Ambulatory Visit: Payer: Commercial Managed Care - PPO | Attending: Cardiovascular Disease | Admitting: Cardiovascular Disease

## 2022-08-06 ENCOUNTER — Other Ambulatory Visit (HOSPITAL_COMMUNITY): Payer: Self-pay

## 2022-08-12 ENCOUNTER — Encounter: Payer: Self-pay | Admitting: *Deleted

## 2022-08-13 ENCOUNTER — Emergency Department (HOSPITAL_COMMUNITY)
Admission: EM | Admit: 2022-08-13 | Discharge: 2022-08-14 | Disposition: A | Discharge status: Home / Self Care | Payer: Commercial Managed Care - PPO | Attending: Emergency Medicine | Admitting: Emergency Medicine

## 2022-08-13 ENCOUNTER — Encounter (HOSPITAL_COMMUNITY): Payer: Self-pay

## 2022-08-13 ENCOUNTER — Other Ambulatory Visit: Payer: Self-pay

## 2022-08-13 DIAGNOSIS — E119 Type 2 diabetes mellitus without complications: Secondary | ICD-10-CM | POA: Insufficient documentation

## 2022-08-13 DIAGNOSIS — Z91040 Latex allergy status: Secondary | ICD-10-CM | POA: Insufficient documentation

## 2022-08-13 DIAGNOSIS — R252 Cramp and spasm: Secondary | ICD-10-CM | POA: Diagnosis not present

## 2022-08-13 DIAGNOSIS — I10 Essential (primary) hypertension: Secondary | ICD-10-CM | POA: Insufficient documentation

## 2022-08-13 DIAGNOSIS — M79673 Pain in unspecified foot: Secondary | ICD-10-CM | POA: Diagnosis not present

## 2022-08-13 DIAGNOSIS — Z7982 Long term (current) use of aspirin: Secondary | ICD-10-CM | POA: Insufficient documentation

## 2022-08-13 DIAGNOSIS — Z79899 Other long term (current) drug therapy: Secondary | ICD-10-CM | POA: Diagnosis not present

## 2022-08-13 LAB — CBC WITH DIFFERENTIAL/PLATELET
Abs Immature Granulocytes: 0.05 10*3/uL (ref 0.00–0.07)
Basophils Absolute: 0.1 10*3/uL (ref 0.0–0.1)
Basophils Relative: 1 %
Eosinophils Absolute: 0.2 10*3/uL (ref 0.0–0.5)
Eosinophils Relative: 2 %
HCT: 38.3 % (ref 36.0–46.0)
Hemoglobin: 12 g/dL (ref 12.0–15.0)
Immature Granulocytes: 1 %
Lymphocytes Relative: 35 %
Lymphs Abs: 3.4 10*3/uL (ref 0.7–4.0)
MCH: 26.9 pg (ref 26.0–34.0)
MCHC: 31.3 g/dL (ref 30.0–36.0)
MCV: 85.9 fL (ref 80.0–100.0)
Monocytes Absolute: 1.1 10*3/uL — ABNORMAL HIGH (ref 0.1–1.0)
Monocytes Relative: 11 %
Neutro Abs: 4.9 10*3/uL (ref 1.7–7.7)
Neutrophils Relative %: 50 %
Platelets: 285 10*3/uL (ref 150–400)
RBC: 4.46 MIL/uL (ref 3.87–5.11)
RDW: 14.8 % (ref 11.5–15.5)
WBC: 9.7 10*3/uL (ref 4.0–10.5)
nRBC: 0 % (ref 0.0–0.2)

## 2022-08-13 LAB — BASIC METABOLIC PANEL
Anion gap: 7 (ref 5–15)
BUN: 17 mg/dL (ref 8–23)
CO2: 23 mmol/L (ref 22–32)
Calcium: 8.1 mg/dL — ABNORMAL LOW (ref 8.9–10.3)
Chloride: 105 mmol/L (ref 98–111)
Creatinine, Ser: 0.75 mg/dL (ref 0.44–1.00)
GFR, Estimated: >60 mL/min (ref >60)
Glucose, Bld: 97 mg/dL (ref 70–99)
Potassium: 4.3 mmol/L (ref 3.5–5.1)
Sodium: 135 mmol/L (ref 135–145)

## 2022-08-13 LAB — CBG MONITORING, ED: Glucose-Capillary: 109 mg/dL — ABNORMAL HIGH (ref 70–99)

## 2022-08-13 NOTE — ED Notes (Addendum)
Urine sent to main lab.

## 2022-08-13 NOTE — ED Triage Notes (Signed)
Pt states that a few hours ago she began having cramping in her feet and lower legs that is similar to when she has been dehydrated in the past. She also endorses feeling "swimmy headed".  Pt states that she thinks that she needs some IV fluids.

## 2022-08-13 NOTE — ED Provider Notes (Signed)
[place] EMERGENCY DEPARTMENT AT [facility] Provider Note   CSN: [id number] Arrival date & time: 08/13/22  2119     History {Add pertinent medical, surgical, social history, OB history to HPI:1} Chief Complaint  Patient presents with   Dehydration    [name] is a 62 y.o. female.  HPI   Patient with medical history including hyperlipidemia, peripheral edema, diabetes, hypertension, status post gastric sleeve 2018 presenting with complaints of foot cramping.  Patient states started earlier today, mainly in her left foot on her great toe, states it became very severe, she states she did feel slightly in the right foot and occasionally into her right calf but she has no actual calf tenderness, she denies any shortness of breath or chest pain no history of PEs or DVTs currently on oral birth control no recent surgeries no long immobilizations.  She states that she had stomach surgery and was told that if she starts to get severe cramping she could be dehydrated needs to come to the hospital.  States this is never actually happened to her in the past.  She states that she has been eating and drinking without difficulty, no nausea vomiting diarrhea, no stomach pain no chest pain or shortness of breath she has no other complaints.    Home Medications Prior to Admission medications   Medication Sig Start Date End Date Taking? Authorizing Provider  albuterol (PROVENTIL HFA;VENTOLIN HFA) 108 (90 BASE) MCG/ACT inhaler Inhale 1-2 puffs into the lungs every 6 (six) hours as needed for wheezing or shortness of breath. 10/09/14   Croitoru, Mihai, MD  aspirin EC 81 MG tablet Take 81 mg by mouth daily.     [provider]  azelastine (ASTELIN) 0.1 % nasal spray PLACE 1 SPRAY PER NOSTRIL ONCE DAILY AT NIGHT 01/11/22     Azelastine HCl 137 MCG/SPRAY SOLN PLACE 1 SPRAY IN EACH NOSTRIL ONCE NIGHTLY 12/18/19 02/10/22  Alysia Penna, MD  azithromycin (ZITHROMAX) 250 MG tablet Take  2 tablets by mouth today then take 1 tablet by mouth daily for 4 days 04/12/22     baclofen (LIORESAL) 10 MG tablet Take 0.5-1 tablets (5-10 mg total) by mouth every 8 (eight) hours as needed for muscle spasms 03/03/22   Eldred Manges, MD  clobetasol ointment (TEMOVATE) 0.05 % Apply as directed twice daily for 2 weeks 12/28/21   Romualdo Bolk, MD  diazepam (VALIUM) 5 MG tablet Take 1 tablet by mouth once daily as needed for neck pain 05/11/21     esomeprazole (NEXIUM) 40 MG capsule Take 1 capsule (40 mg total) by mouth daily. 11/06/21     esomeprazole (NEXIUM) 40 MG capsule Take 1 capsule (40 mg total) by mouth daily. 04/05/22     fluticasone (FLONASE) 50 MCG/ACT nasal spray PLACE 2 SPRAYS IN EACH NOSTRIL DAILY AS DIRECTED 12/18/19 12/20/20  Alysia Penna, MD  fluticasone (FLONASE) 50 MCG/ACT nasal spray Place 1 spray in each nostril once a day 09/16/21     furosemide (LASIX) 20 MG tablet Take 3 tablets (60 mg total) by mouth as needed. NEED OV. 03/07/19   Croitoru, Mihai, MD  gabapentin (NEURONTIN) 300 MG capsule Take 1 capsule (300 mg total) by mouth 3 (three) times daily. 02/18/22   Juanda Chance, NP  levothyroxine (SYNTHROID) 75 MCG tablet TAKE 1 TABLET BY MOUTH EVERY MORNING ON AN EMPTY STOMACH 09/15/21     levothyroxine (SYNTHROID) 75 MCG tablet Take 1 tablet (75 mcg total) by mouth every morning  on an empty stomach. 04/05/22     linaclotide (LINZESS) 72 MCG capsule Take 72 mcg by mouth daily as needed.    [provider]  lisinopril (ZESTRIL) 40 MG tablet TAKE 1 TABLET BY MOUTH ONCE A DAY 05/02/20 05/02/21  Croitoru, Mihai, MD  loratadine (CLARITIN) 10 MG tablet Take 10 mg by mouth daily.    [provider]  meloxicam (MOBIC) 15 MG tablet Take 1 tablet (15 mg total) by mouth daily. 07/08/22     methocarbamol (ROBAXIN) 500 MG tablet TAKE 1 TABLET BY MOUTH EVERY 8 HOURS AS NEEDED FOR MUSCLE SPASMS 02/08/22 02/08/23  Juanda Chance, NP  metoprolol tartrate (LOPRESSOR) 25 MG  tablet TAKE 1 TABLET BY MOUTH 2 TIMES DAILY 04/05/22     metoprolol tartrate (LOPRESSOR) 25 MG tablet Take 1 tablet (25 mg total) by mouth 2 (two) times daily. 07/08/22     nirmatrelvir & ritonavir (PAXLOVID, 300/100,) 20 x 150 MG & 10 x 100MG  TBPK Take 3 tablets by mouth twice a day for 5 days. 07/01/22     nystatin cream (MYCOSTATIN) Apply to affected area 2 times a day for up to 7 days. 08/11/21   Romualdo Bolk, MD  ofloxacin (OCUFLOX) 0.3 % ophthalmic solution Place 1 drop into left eye 3 times a day for 1 week, then 1 drop twice a day x 1 week then 1 drop daily x 14 days (starting day after surgery) 03/12/21     olmesartan (BENICAR) 20 MG tablet Take 1 tablet (20 mg total) by mouth daily. 02/04/22     olmesartan (BENICAR) 20 MG tablet Take 1 tablet (20 mg total) by mouth daily. 04/05/22     Omega-3 Fatty Acids (FISH OIL) 1000 MG CAPS Take 1,000 mg by mouth daily.    [provider]  potassium chloride SA (K-DUR) 20 MEQ tablet TAKE 1 TABLET (20 MEQ TOTAL) BY MOUTH DAILY. 08/01/18 10/30/18  Croitoru, Rachelle Hora, MD  prednisoLONE acetate (PRED FORTE) 1 % ophthalmic suspension Place 1 drop into left eye 3 times a day for 1 week, then 1 drop twice a day for 1 week, then 1 drop once a day for 14 days (starting the day after surgery) 03/12/21     predniSONE (STERAPRED UNI-PAK 21 TAB) 10 MG (21) TBPK tablet Take as directed per package instructions. 05/18/22     TURMERIC CURCUMIN PO Take by mouth daily.    [provider]  vitamin C (ASCORBIC ACID) 500 MG tablet Take 500 mg by mouth daily.    [provider]  VITAMIN D PO Take by mouth.    [provider]      Allergies    Metronidazole, Crestor [rosuvastatin], Tramadol hcl, Adhesive [tape], Latex, Penicillins, and Vesicare [solifenacin]    Review of Systems   Review of Systems  Constitutional:  Negative for chills and fever.  Respiratory:  Negative for shortness of breath.   Cardiovascular:  Negative for chest pain.   Gastrointestinal:  Negative for abdominal pain.  Neurological:  Negative for headaches.    Physical Exam Updated Vital Signs BP (!) 149/101   Pulse (!) 54   Temp 97.6 F (36.4 C) (Oral)   Resp 16   Ht 5\' 2"  (1.575 m)   Wt 102.1 kg   SpO2 98%   BMI 41.15 kg/m  Physical Exam Vitals and nursing note reviewed.  Constitutional:      General: She is not in acute distress.    Appearance: She is not ill-appearing.  HENT:     Head: Normocephalic and atraumatic.     Nose: No congestion.  Eyes:     Conjunctiva/sclera: Conjunctivae normal.  Cardiovascular:     Rate and Rhythm: Normal rate and regular rhythm.     Pulses: Normal pulses.     Heart sounds: No murmur heard.    No friction rub. No gallop.  Pulmonary:     Effort: No respiratory distress.     Breath sounds: No wheezing, rhonchi or rales.  Abdominal:     Palpations: Abdomen is soft.     Tenderness: There is no abdominal tenderness. There is no right CVA tenderness or left CVA tenderness.  Musculoskeletal:     Right lower leg: No edema.     Left lower leg: No edema.     Comments: Focused exam of the lower extremity was benign, there is no unilateral leg swelling no calf tenderness no palpable cords, noted surgical scars on the dorsum of the left foot, both feet were nontender to palpation, she has full range of motion both her toes and ankle, she has 2+ dorsal pedal pulses, sensation tact light touch.   Skin:    General: Skin is warm and dry.  Neurological:     Mental Status: She is alert.  Psychiatric:        Mood and Affect: Mood normal.     ED Results / Procedures / Treatments   Labs (all labs ordered are listed, but only abnormal results are displayed) Labs Reviewed  CBC WITH DIFFERENTIAL/PLATELET - Abnormal; Notable for the following components:      Result Value   Monocytes Absolute 1.1 (*)    All other components within normal limits  CBG MONITORING, ED - Abnormal; Notable for the following components:    Glucose-Capillary 109 (*)    All other components within normal limits  BASIC METABOLIC PANEL    EKG None  Radiology No results found.  Procedures Procedures  {Document cardiac monitor, telemetry assessment procedure when appropriate:1}  Medications Ordered in ED Medications - No data to display  ED Course/ Medical Decision Making/ A&P   {   Click here for ABCD2, HEART and other calculatorsREFRESH Note before signing :1}                          Medical Decision Making  This patient presents to the ED for concern of muscle cramping, this involves an extensive number of treatment options, and is a complaint that carries with it a high risk of complications and morbidity.  The differential diagnosis includes metabolic abnormality, DVT, limb ischemia    Additional history obtained:  Additional history obtained from N/A External records from outside source obtained and reviewed including orthopedic notes   Co morbidities that complicate the patient evaluation  Status post gastric sleeve  Social Determinants of Health:  N/A    Lab Tests:  I Ordered, and personally interpreted labs.  The pertinent results include: CBC is unremarkable, CBG 109,   Imaging Studies ordered:  I ordered imaging studies including n/a  I independently visualized and interpreted imaging which showed n/a I agree with the radiologist interpretation   Cardiac Monitoring:  The patient was maintained on a cardiac monitor.  I personally viewed and interpreted the cardiac monitored which showed an underlying rhythm of: Sinus without signs of ischemia   Medicines ordered and prescription drug management:  I ordered medication including ***  for ***  I have reviewed  the patients home medicines and have made adjustments as needed  Critical Interventions:  ***   Reevaluation:  Presents with foot cramping, triage obtain basic lab workup, she had a benign physical exam, will continue to  monitor.  Consultations Obtained:  I requested consultation with the ***,  and discussed lab and imaging findings as well as pertinent plan - they recommend: ***    Test Considered:  ***    Rule out ****    Dispostion and problem list  After consideration of the diagnostic results and the patients response to treatment, I feel that the patent would benefit from ***.       {Document critical care time when appropriate:1} {Document review of labs and clinical decision tools ie heart score, Chads2Vasc2 etc:1}  {Document your independent review of radiology images, and any outside records:1} {Document your discussion with family members, caretakers, and with consultants:1} {Document social determinants of health affecting pt's care:1} {Document your decision making why or why not admission, treatments were needed:1} Final Clinical Impression(s) / ED Diagnoses Final diagnoses:  None    Rx / DC Orders ED Discharge Orders     None

## 2022-08-14 ENCOUNTER — Other Ambulatory Visit (HOSPITAL_COMMUNITY): Payer: Self-pay

## 2022-08-14 MED ORDER — CYCLOBENZAPRINE HCL 10 MG PO TABS
10.0000 mg | ORAL_TABLET | Freq: Two times a day (BID) | ORAL | 0 refills | Status: DC | PRN
  Filled 2022-08-14: qty 20, 10d supply, fill #0

## 2022-08-14 NOTE — Discharge Instructions (Signed)
Please continue take all your home medications, may use over-the-counter pain medication as needed, please stay hydrated, I did give you a muscle laxer to use as needed.    I have also given you a prescription for a muscle relaxer this can make you drowsy do not consume alcohol or operate heavy machinery when taking this medication.   Please follow with your primary doctor as needed  Come back to the emergency department if you develop chest pain, shortness of breath, severe abdominal pain, uncontrolled nausea, vomiting, diarrhea.

## 2022-08-16 ENCOUNTER — Ambulatory Visit: Payer: Commercial Managed Care - PPO | Admitting: Neurology

## 2022-08-16 ENCOUNTER — Encounter: Payer: Self-pay | Admitting: Neurology

## 2022-08-16 VITALS — BP 104/60 | HR 64 | Ht 62.0 in | Wt 214.0 lb

## 2022-08-16 DIAGNOSIS — Z818 Family history of other mental and behavioral disorders: Secondary | ICD-10-CM | POA: Diagnosis not present

## 2022-08-16 DIAGNOSIS — F439 Reaction to severe stress, unspecified: Secondary | ICD-10-CM

## 2022-08-16 DIAGNOSIS — R413 Other amnesia: Secondary | ICD-10-CM

## 2022-08-16 DIAGNOSIS — G4733 Obstructive sleep apnea (adult) (pediatric): Secondary | ICD-10-CM

## 2022-08-16 NOTE — Patient Instructions (Signed)
You have complaints of memory loss: memory loss or changes in cognitive function can have many reasons and does not always mean you have dementia. Conditions that can contribute to subjective or objective memory loss include: depression, stress, poor sleep from insomnia or sleep apnea, dehydration, fluctuation in blood sugar values, thyroid or electrolyte dysfunction and certain vitamin deficiencies. Dementia can be caused by stroke, brain atherosclerosis or brain vascular disease due to vascular risk factors (smoking, high blood pressure, high cholesterol, obesity and uncontrolled diabetes), certain degenerative brain disorders (including Parkinson's disease and Multiple sclerosis) and by Alzheimer's disease or other, more rare and sometimes hereditary causes. We will do some additional testing:  blood work (which we will do today).  We will do a brain scan, an MRI, if possible.  We will not start medication as yet.  We will also request a formal cognitive test called neuropsychological evaluation which is done by a licensed neuropsychologist. We will make a referral in that regard. Please restart your CPAP consistently.  We will call you with brain scan test results and monitor your symptoms.

## 2022-08-16 NOTE — Progress Notes (Signed)
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 very much for allowing me to participate in the care of this nice patient. If I can be of any further assistance to you please do not hesitate to call me at [phone].  Sincerely,   Huston Foley, MD, PhD

## 2022-08-17 ENCOUNTER — Telehealth: Payer: Self-pay | Admitting: Neurology

## 2022-08-17 LAB — APOE ALZHEIMER'S RISK

## 2022-08-17 LAB — RPR: RPR Ser Ql: NONREACTIVE

## 2022-08-17 LAB — HGB A1C W/O EAG: Hgb A1c MFr Bld: 5.9 % — ABNORMAL HIGH (ref 4.8–5.6)

## 2022-08-17 LAB — SEDIMENTATION RATE: Sed Rate: 24 mm/hr (ref 0–40)

## 2022-08-17 LAB — ANA W/REFLEX: Anti Nuclear Antibody (ANA): NEGATIVE

## 2022-08-17 LAB — TSH: TSH: 2.53 u[IU]/mL (ref 0.450–4.500)

## 2022-08-17 LAB — B12 AND FOLATE PANEL: Folate: 19.8 ng/mL (ref >3.0)

## 2022-08-17 LAB — VITAMIN B1

## 2022-08-17 LAB — VITAMIN D 25 HYDROXY (VIT D DEFICIENCY, FRACTURES): Vit D, 25-Hydroxy: 45.1 ng/mL (ref 30.0–100.0)

## 2022-08-17 NOTE — Telephone Encounter (Signed)
[name] NPR sent to [facility] because she has an implanted device. [phone]

## 2022-08-18 DIAGNOSIS — G4733 Obstructive sleep apnea (adult) (pediatric): Secondary | ICD-10-CM | POA: Diagnosis not present

## 2022-08-18 LAB — ATN PROFILE

## 2022-08-19 LAB — VITAMIN B6: Vitamin B6: 16.7 ug/L (ref 3.4–65.2)

## 2022-08-19 LAB — B12 AND FOLATE PANEL: Vitamin B-12: 727 pg/mL (ref 232–1245)

## 2022-08-21 ENCOUNTER — Ambulatory Visit (HOSPITAL_COMMUNITY)
Admission: RE | Admit: 2022-08-21 | Discharge: 2022-08-21 | Disposition: A | Discharge status: Home / Self Care | Payer: Commercial Managed Care - PPO | Source: Ambulatory Visit | Attending: Neurology | Admitting: Neurology

## 2022-08-21 DIAGNOSIS — R413 Other amnesia: Secondary | ICD-10-CM | POA: Diagnosis not present

## 2022-08-21 DIAGNOSIS — Z818 Family history of other mental and behavioral disorders: Secondary | ICD-10-CM | POA: Diagnosis not present

## 2022-08-21 DIAGNOSIS — J341 Cyst and mucocele of nose and nasal sinus: Secondary | ICD-10-CM | POA: Diagnosis not present

## 2022-08-21 DIAGNOSIS — F439 Reaction to severe stress, unspecified: Secondary | ICD-10-CM | POA: Insufficient documentation

## 2022-08-21 DIAGNOSIS — G4733 Obstructive sleep apnea (adult) (pediatric): Secondary | ICD-10-CM | POA: Insufficient documentation

## 2022-08-21 MED ORDER — GADOBUTROL 1 MMOL/ML IV SOLN
10.0000 mL | Freq: Once | INTRAVENOUS | Status: AC | PRN
  Administered 2022-08-21: 10 mL via INTRAVENOUS

## 2022-08-26 ENCOUNTER — Telehealth: Payer: Self-pay | Admitting: *Deleted

## 2022-08-26 ENCOUNTER — Other Ambulatory Visit (HOSPITAL_COMMUNITY): Payer: Self-pay

## 2022-08-26 ENCOUNTER — Encounter: Payer: Self-pay | Admitting: *Deleted

## 2022-08-26 MED ORDER — DONEPEZIL HCL 5 MG PO TABS
5.0000 mg | ORAL_TABLET | Freq: Every day | ORAL | 5 refills | Status: DC
  Filled 2022-08-26: qty 30, 30d supply, fill #0
  Filled 2022-09-24 – 2022-09-25 (×2): qty 30, 30d supply, fill #1
  Filled 2022-12-16 – 2023-01-01 (×2): qty 30, 30d supply, fill #2
  Filled 2023-02-07: qty 30, 30d supply, fill #3
  Filled 2023-03-05: qty 30, 30d supply, fill #4
  Filled 2023-05-03 – 2023-05-18 (×2): qty 30, 30d supply, fill #5

## 2022-08-26 NOTE — Telephone Encounter (Signed)
-----   Message from Nurse Truitt Leep sent at 08/26/2022  1:49 PM EDT -----  ----- Message ----- From: Asa Lente, MD Sent: 08/26/2022  12:55 PM EDT To: Gna-Pod 1 Results  Please let her or her family know that the blood work did show changes that could be consistent with Alzheimer's disease.  Please also send in a prescription for donepezil 5 mg #30 with 5 refills 1 p.o. daily and make sure that she does have a follow-up at some point with Dr. Frances Furbish.

## 2022-08-26 NOTE — Telephone Encounter (Addendum)
Spoke to pt gave bloodwork results informed pt per Dr Epimenio Foot due to bloodwork results will start on donepezil 5 mg daily and pt has f/u appointment with Dr. Frances Furbish in Oct  Pt didn't have any questions and thanked me for calling . Rx was sent to pt pharmacy

## 2022-08-28 ENCOUNTER — Other Ambulatory Visit (HOSPITAL_COMMUNITY): Payer: Self-pay

## 2022-09-08 ENCOUNTER — Other Ambulatory Visit: Payer: Self-pay

## 2022-09-08 ENCOUNTER — Ambulatory Visit: Payer: Commercial Managed Care - PPO | Attending: Cardiovascular Disease | Admitting: Cardiovascular Disease

## 2022-09-08 ENCOUNTER — Encounter: Payer: Self-pay | Admitting: Cardiovascular Disease

## 2022-09-08 VITALS — BP 128/80 | HR 61 | Ht 62.0 in | Wt 215.0 lb

## 2022-09-08 DIAGNOSIS — I10 Essential (primary) hypertension: Secondary | ICD-10-CM | POA: Diagnosis not present

## 2022-09-08 MED ORDER — METOPROLOL TARTRATE 25 MG PO TABS
25.0000 mg | ORAL_TABLET | Freq: Two times a day (BID) | ORAL | 1 refills | Status: DC
  Filled 2022-09-08 – 2022-11-08 (×3): qty 180, 90d supply, fill #0
  Filled 2023-02-10: qty 180, 90d supply, fill #1

## 2022-09-08 NOTE — Patient Instructions (Signed)
Medication Instructions:  Stop Lisinopril *If you need a refill on your cardiac medications before your next appointment, please call your pharmacy*  Testing/Procedures: Keep a log of BP's through the weekend and send in readings on Monday over MyChart.    Follow-Up: At [facility], you and your health needs are our priority.  As part of our continuing mission to provide you with exceptional heart care, we have created designated Provider Care Teams.  These Care Teams include your primary Cardiologist (physician) and Advanced Practice Providers (APPs -  Physician Assistants and Nurse Practitioners) who all work together to provide you with the care you need, when you need it.  We recommend signing up for the patient portal called "MyChart".  Sign up information is provided on this After Visit Summary.  MyChart is used to connect with patients for Virtual Visits (Telemedicine).  Patients are able to view lab/test results, encounter notes, upcoming appointments, etc.  Non-urgent messages can be sent to your provider as well.   To learn more about what you can do with MyChart, go to [email or web address].    Your next appointment:   1 year(s)  Provider:   Thurmon Fair, MD

## 2022-09-08 NOTE — Progress Notes (Signed)
Patient ID: [name], female   DOB: Feb 08, 1960, 62 y.o.   MRN: [id number]      Cardiology Office Note   Date:  09/08/2022   ID:  [name], DOB 1960/08/31, MRN [id number]  PCP:  Alysia Penna, MD  Cardiologist:   Thurmon Fair, MD   No chief complaint on file.     History of Present Illness: [name] is a 62 y.o. female who presents for  Follow-up for hypertension and hyperlipidemia in the setting of morbid obesity.  Generally doing okay except she has been having some issues with memory recently.  She saw a neurologist.  She was taking lots of medications that could interfere with alertness and memory (baclofen, cyclobenzaprine, methocarbamol, etc.) and believes that she has been doing better since then.  She was started on donepezil as well.  Blood pressure has been episodically high, probably due to pain in her joints.  However when she was seen on 08/16/2022 her blood pressure was only 104/60.  Her initial blood pressure here today was 140/90, but when I rechecked it was down to 128/80.  She believes she has been having episodes of rather low blood pressure.  The patient specifically denies any chest pain at rest exertion, dyspnea at rest or with exertion, orthopnea, paroxysmal nocturnal dyspnea, syncope, palpitations, focal neurological deficits, intermittent claudication, lower extremity edema, unexplained weight gain, cough, hemoptysis or wheezing.   She continues to lose weight.  She's lost about 10 pounds in the last year.  She's lost about 80 pounds since her peak weight before her gastric sleeve procedure.  She takes furosemide only as needed for swelling in her lower extremities but she has not taken any in a few months.  Similarly she has not required any albuterol inhaler in many months.  She is taking both olmesartan and lisinopril for blood pressure, in addition to metoprolol.  She is trying to avoid taking meloxicam.  She has a long-time history of  hyperlipidemia and hypertension and has super morbid obesity, despite about 30 lb weight loss after gastric sleeve bariatric surgery. She underwent coronary angiography in 2007 and did not have evidence of coronary artery disease. 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 In 2013 she was hospitalized with chest pain, apparently related to drug-induced pancreatitis (metronidazole). In 2015 she underwent an echocardiogram that is essentially normal except for mild diastolic dysfunction. She had a cholecystectomy in her 32s and steatohepatitis by imaging studies. She has gastroesophageal reflux disease which is occasionally symptomatic. She has multiple arthralgias and needs to take a nonsteroidal anti-inflammatory drug on a regular basis. She has chronic low back pain despite lumbar spine surgery in 2012. She had a lumbar spine stimulator implanted in July 2021.  Past Medical History:  Diagnosis Date   Anemia when she was young   Arthritis    Congestion of nasal sinus    Constipation    takes Colace every other day   DDD (degenerative disc disease), lumbar    Dependent edema    Diabetes mellitus without complication (HCC)    per pt had been diagnosed but then "number started going down so my doctor said i didnt have diabetes"   Dry eyes    uses Systane Eye drops daily as needed   GERD (gastroesophageal reflux disease) 12/06/2011   takes Nexium daily   Headache(784.0)    occasionally-d/t congestion   Heart murmur    History of bronchitis 6-7 yrs ago  Hyperlipidemia    Hypertension    takes Metoprolol and Diovan daily   Hypothyroidism 12/06/2011   takes Synthroid daily   Joint pain    Multiple allergies    takes Zyrtec daily;uses Nasonex daily as needed   Nocturia    Numbness    weakness-right hand   Onychomycosis    OSA on CPAP    Pancreatitis    Peripheral edema    takes Furosemide daily   Shingles    Urinary  frequency    Venous insufficiency of leg     Past Surgical History:  Procedure Laterality Date   ABDOMINAL HYSTERECTOMY     uterine prolapse   BACK SURGERY     L4-5   CARDIAC SURGERY     CATARACT EXTRACTION, BILATERAL  2023   CHOLECYSTECTOMY     COLONOSCOPY     ESOPHAGOGASTRODUODENOSCOPY  5-87yrs ago   FOOT SURGERY Bilateral    KNEE ARTHROSCOPY     LAPAROSCOPIC GASTRIC SLEEVE RESECTION N/A 08/23/2016   Procedure: LAPAROSCOPIC GASTRIC SLEEVE RESECTION WITH UPPER ENDO;  Surgeon: Luretha Murphy, MD;  Location: [facility];  Service: General;  Laterality: N/A;   POSTERIOR LUMBAR FUSION  03/10/2009   L3-4 and L3-4 fusion, Dr. Hilda Lias   SHOULDER ARTHROSCOPY WITH ROTATOR CUFF REPAIR AND SUBACROMIAL DECOMPRESSION Right 08/23/2013   Procedure: RIGHT SHOULDER ARTHROSCOPY WITH  SUBACROMIAL DECOMPRESSION DISTAL CLAVICLE RESECTION AND  ROTATOR CUFF REPAIR ;  Surgeon: Senaida Lange, MD;  Location: [facility] OR;  Service: Orthopedics;  Laterality: Right;   TUBAL LIGATION       Current Outpatient Medications  Medication Sig Dispense Refill   aspirin EC 81 MG tablet Take 81 mg by mouth daily.      azelastine (ASTELIN) 0.1 % nasal spray PLACE 1 SPRAY PER NOSTRIL ONCE DAILY AT NIGHT 30 mL 4   baclofen (LIORESAL) 10 MG tablet Take 0.5-1 tablets (5-10 mg total) by mouth every 8 (eight) hours as needed for muscle spasms 60 tablet 1   clobetasol ointment (TEMOVATE) 0.05 % Apply as directed twice daily for 2 weeks 30 g 0   cyclobenzaprine (FLEXERIL) 10 MG tablet Take 1 tablet (10 mg total) by mouth 2 (two) times daily as needed for muscle spasms. 20 tablet 0   diazepam (VALIUM) 5 MG tablet Take 1 tablet by mouth once daily as needed for neck pain 30 tablet 3   donepezil (ARICEPT) 5 MG tablet Take 1 tablet (5 mg total) by mouth at bedtime. 30 tablet 5   esomeprazole (NEXIUM) 40 MG capsule Take 1 capsule (40 mg total) by mouth daily. 90 capsule 3   esomeprazole (NEXIUM) 40 MG capsule Take 1 capsule (40 mg  total) by mouth daily. 90 capsule 3   fluticasone (FLONASE) 50 MCG/ACT nasal spray Place 1 spray in each nostril once a day 48 g 4   furosemide (LASIX) 20 MG tablet Take 3 tablets (60 mg total) by mouth as needed. NEED OV. 90 tablet 3   gabapentin (NEURONTIN) 300 MG capsule Take 1 capsule (300 mg total) by mouth 3 (three) times daily. 270 capsule 1   levothyroxine (SYNTHROID) 75 MCG tablet TAKE 1 TABLET BY MOUTH EVERY MORNING ON AN EMPTY STOMACH 90 tablet 2   loratadine (CLARITIN) 10 MG tablet Take 10 mg by mouth daily.     meloxicam (MOBIC) 15 MG tablet Take 1 tablet (15 mg total) by mouth daily. 90 tablet 3   methocarbamol (ROBAXIN) 500 MG tablet TAKE 1 TABLET BY MOUTH EVERY  8 HOURS AS NEEDED FOR MUSCLE SPASMS 60 tablet 3   metoprolol tartrate (LOPRESSOR) 25 MG tablet TAKE 1 TABLET BY MOUTH 2 TIMES DAILY 180 tablet 1   nystatin cream (MYCOSTATIN) Apply to affected area 2 times a day for up to 7 days. 30 g 1   olmesartan (BENICAR) 20 MG tablet Take 1 tablet (20 mg total) by mouth daily. 90 tablet 3   Omega-3 Fatty Acids (FISH OIL) 1000 MG CAPS Take 1,000 mg by mouth daily.     vitamin C (ASCORBIC ACID) 500 MG tablet Take 500 mg by mouth daily.     VITAMIN D PO Take 1 tablet by mouth daily at 6 (six) AM.     albuterol (PROVENTIL HFA;VENTOLIN HFA) 108 (90 BASE) MCG/ACT inhaler Inhale 1-2 puffs into the lungs every 6 (six) hours as needed for wheezing or shortness of breath. (Patient not taking: Reported on 09/08/2022) 1 Inhaler 2   Azelastine HCl 137 MCG/SPRAY SOLN PLACE 1 SPRAY IN EACH NOSTRIL ONCE NIGHTLY 30 mL 3   azithromycin (ZITHROMAX) 250 MG tablet Take 2 tablets by mouth today then take 1 tablet by mouth daily for 4 days (Patient not taking: Reported on 09/08/2022) 6 tablet 0   fluticasone (FLONASE) 50 MCG/ACT nasal spray PLACE 2 SPRAYS IN EACH NOSTRIL DAILY AS DIRECTED 16 g 3   levothyroxine (SYNTHROID) 75 MCG tablet Take 1 tablet (75 mcg total) by mouth every morning on an empty stomach. 90  tablet 2   linaclotide (LINZESS) 72 MCG capsule Take 72 mcg by mouth daily as needed. (Patient not taking: Reported on 09/08/2022)     metoprolol tartrate (LOPRESSOR) 25 MG tablet Take 1 tablet (25 mg total) by mouth 2 (two) times daily. 180 tablet 1   nirmatrelvir & ritonavir (PAXLOVID, 300/100,) 20 x 150 MG & 10 x 100MG  TBPK Take 3 tablets by mouth twice a day for 5 days. (Patient not taking: Reported on 09/08/2022) 30 tablet 0   ofloxacin (OCUFLOX) 0.3 % ophthalmic solution Place 1 drop into left eye 3 times a day for 1 week, then 1 drop twice a day x 1 week then 1 drop daily x 14 days (starting day after surgery) (Patient not taking: Reported on 09/08/2022) 5 mL 0   olmesartan (BENICAR) 20 MG tablet Take 1 tablet (20 mg total) by mouth daily. 90 tablet 3   potassium chloride SA (K-DUR) 20 MEQ tablet TAKE 1 TABLET (20 MEQ TOTAL) BY MOUTH DAILY. 90 tablet 1   prednisoLONE acetate (PRED FORTE) 1 % ophthalmic suspension Place 1 drop into left eye 3 times a day for 1 week, then 1 drop twice a day for 1 week, then 1 drop once a day for 14 days (starting the day after surgery) (Patient not taking: Reported on 09/08/2022) 5 mL 0   predniSONE (STERAPRED UNI-PAK 21 TAB) 10 MG (21) TBPK tablet Take as directed per package instructions. (Patient not taking: Reported on 09/08/2022) 21 tablet 0   TURMERIC CURCUMIN PO Take by mouth daily. (Patient not taking: Reported on 09/08/2022)     No current facility-administered medications for this visit.    Allergies:   Metronidazole, Crestor [rosuvastatin], Tramadol hcl, Adhesive [tape], Latex, Penicillins, and Vesicare [solifenacin]    Social History:  The patient  reports that she quit smoking about 31 years ago. Her smoking use included cigarettes. She has never used smokeless tobacco. She reports current alcohol use. She reports that she does not use drugs.   Family History:  The  patient's family history includes Alzheimer's disease in her mother; Breast cancer in  her maternal aunt and maternal aunt; Dementia in her mother; Hypertension in her father, mother, sister, and sister; Kidney disease in her sister; Thyroid disease in her sister and sister.    ROS:  Please see the history of present illness.    All other systems are reviewed and are negative.   PHYSICAL EXAM: VS:  BP 128/80   Pulse 61   Ht 5\' 2"  (1.575 m)   Wt 215 lb (97.5 kg)   SpO2 99%   BMI 39.32 kg/m     General: Alert, oriented x3, no distress, severely obese Head: no evidence of trauma, PERRL, EOMI, no exophtalmos or lid lag, no myxedema, no xanthelasma; normal ears, nose and oropharynx Neck: normal jugular venous pulsations and no hepatojugular reflux; brisk carotid pulses without delay and no carotid bruits Chest: clear to auscultation, no signs of consolidation by percussion or palpation, normal fremitus, symmetrical and full respiratory excursions Cardiovascular: normal position and quality of the apical impulse, regular rhythm, normal first and second heart sounds, no murmurs, rubs or gallops Abdomen: no tenderness or distention, no masses by palpation, no abnormal pulsatility or arterial bruits, normal bowel sounds, no hepatosplenomegaly Extremities: no clubbing, cyanosis or edema; 2+ radial, ulnar and brachial pulses bilaterally; 2+ right femoral, posterior tibial and dorsalis pedis pulses; 2+ left femoral, posterior tibial and dorsalis pedis pulses; no subclavian or femoral bruits Neurological: grossly nonfocal Psych: Normal mood and affect   EKG:  EKG  ordered today.  NSR, normal tracing   Recent Labs: 08/13/2022: BUN 17; Creatinine, Ser 0.75; Hemoglobin 12.0; Platelets 285; Potassium 4.3; Sodium 135 08/16/2022: TSH 2.530  09/15/2018 TSH 2.24, normal liver function tests 10/14/2017 hemoglobin 12.2, creatinine 0.7, potassium 4.4  Lipid Panel    Component Value Date/Time   CHOL 214 (H) 08/07/2020 0907   TRIG 88 08/07/2020 0907   HDL 74 08/07/2020 0907   CHOLHDL 2.9  08/07/2020 0907   CHOLHDL 2.8 06/19/2013 0813   VLDL 23 06/19/2013 0813   LDLCALC 125 (H) 08/07/2020 0907  06/08/2017 total cholesterol 203, HDL 83, LDL 103, triglycerides 87 57/32/2025 cholesterol 204, HDL 57, LDL 105, triglycerides 209    Wt Readings from Last 3 Encounters:  09/08/22 215 lb (97.5 kg)  08/16/22 214 lb (97.1 kg)  08/13/22 225 lb (102.1 kg)     ASSESSMENT AND PLAN:  1. Primary hypertension   2. Severe obesity (BMI 35.0-39.9) with comorbidity (HCC)      1. HTN: Blood pressure appears to be well-controlled and it is possible that she has been intermittently having low blood pressure.  Discontinue the lisinopril and remain on olmesartan, metoprolol.  Has not required furosemide in a while. 2.  Morbid obesity: Although the weight loss was gradual, she has lost a lot of weight since her gastric sleeve surgery.  Now in severe obese range with a BMI around 39. 3.  Chronic back pain and left knee pain: Avoid NSAIDs due to their effect on her blood pressure.    Current medicines are reviewed at length with the patient today.  The patient does not have concerns regarding medicines.  The following changes have been made:  no change  Labs/ tests ordered today include:   Orders Placed This Encounter  Procedures   EKG 12-Lead    Patient Instructions  Medication Instructions:  Stop Lisinopril *If you need a refill on your cardiac medications before your next appointment, please call your pharmacy*  Testing/Procedures: Keep a log of BP's through the weekend and send in readings on Monday over MyChart.    Follow-Up: At [facility], you and your health needs are our priority.  As part of our continuing mission to provide you with exceptional heart care, we have created designated Provider Care Teams.  These Care Teams include your primary Cardiologist (physician) and Advanced Practice Providers (APPs -  Physician Assistants and Nurse Practitioners) who all work  together to provide you with the care you need, when you need it.  We recommend signing up for the patient portal called "MyChart".  Sign up information is provided on this After Visit Summary.  MyChart is used to connect with patients for Virtual Visits (Telemedicine).  Patients are able to view lab/test results, encounter notes, upcoming appointments, etc.  Non-urgent messages can be sent to your provider as well.   To learn more about what you can do with MyChart, go to [email or web address].    Your next appointment:   1 year(s)  Provider:   Thurmon Fair, MD      Signed, Thurmon Fair, MD  09/08/2022 6:04 PM    Thurmon Fair, MD, [facility] [phone] office [phone] pager

## 2022-09-09 ENCOUNTER — Other Ambulatory Visit (HOSPITAL_COMMUNITY): Payer: Self-pay

## 2022-09-16 ENCOUNTER — Encounter: Payer: Self-pay | Admitting: Psychology

## 2022-09-18 DIAGNOSIS — G4733 Obstructive sleep apnea (adult) (pediatric): Secondary | ICD-10-CM | POA: Diagnosis not present

## 2022-09-25 ENCOUNTER — Other Ambulatory Visit (HOSPITAL_COMMUNITY): Payer: Self-pay

## 2022-09-25 ENCOUNTER — Other Ambulatory Visit (HOSPITAL_BASED_OUTPATIENT_CLINIC_OR_DEPARTMENT_OTHER): Payer: Self-pay

## 2022-09-28 ENCOUNTER — Other Ambulatory Visit (HOSPITAL_COMMUNITY): Payer: Self-pay

## 2022-10-07 ENCOUNTER — Other Ambulatory Visit (HOSPITAL_COMMUNITY): Payer: Self-pay

## 2022-10-12 DIAGNOSIS — E039 Hypothyroidism, unspecified: Secondary | ICD-10-CM | POA: Diagnosis not present

## 2022-10-12 DIAGNOSIS — I10 Essential (primary) hypertension: Secondary | ICD-10-CM | POA: Diagnosis not present

## 2022-10-12 DIAGNOSIS — E785 Hyperlipidemia, unspecified: Secondary | ICD-10-CM | POA: Diagnosis not present

## 2022-10-12 DIAGNOSIS — E1165 Type 2 diabetes mellitus with hyperglycemia: Secondary | ICD-10-CM | POA: Diagnosis not present

## 2022-10-19 DIAGNOSIS — G4733 Obstructive sleep apnea (adult) (pediatric): Secondary | ICD-10-CM | POA: Diagnosis not present

## 2022-10-22 DIAGNOSIS — Z1339 Encounter for screening examination for other mental health and behavioral disorders: Secondary | ICD-10-CM | POA: Diagnosis not present

## 2022-10-22 DIAGNOSIS — M545 Low back pain, unspecified: Secondary | ICD-10-CM | POA: Diagnosis not present

## 2022-10-22 DIAGNOSIS — Z1331 Encounter for screening for depression: Secondary | ICD-10-CM | POA: Diagnosis not present

## 2022-10-22 DIAGNOSIS — E1165 Type 2 diabetes mellitus with hyperglycemia: Secondary | ICD-10-CM | POA: Diagnosis not present

## 2022-10-22 DIAGNOSIS — E785 Hyperlipidemia, unspecified: Secondary | ICD-10-CM | POA: Diagnosis not present

## 2022-10-22 DIAGNOSIS — I10 Essential (primary) hypertension: Secondary | ICD-10-CM | POA: Diagnosis not present

## 2022-10-22 DIAGNOSIS — E1169 Type 2 diabetes mellitus with other specified complication: Secondary | ICD-10-CM | POA: Diagnosis not present

## 2022-10-22 DIAGNOSIS — M1990 Unspecified osteoarthritis, unspecified site: Secondary | ICD-10-CM | POA: Diagnosis not present

## 2022-10-22 DIAGNOSIS — R82998 Other abnormal findings in urine: Secondary | ICD-10-CM | POA: Diagnosis not present

## 2022-10-22 DIAGNOSIS — Z0000 Encounter for general adult medical examination without abnormal findings: Secondary | ICD-10-CM | POA: Diagnosis not present

## 2022-10-22 DIAGNOSIS — R4189 Other symptoms and signs involving cognitive functions and awareness: Secondary | ICD-10-CM | POA: Diagnosis not present

## 2022-10-28 ENCOUNTER — Other Ambulatory Visit: Payer: Self-pay | Admitting: Orthopaedic Surgery

## 2022-10-29 ENCOUNTER — Other Ambulatory Visit (HOSPITAL_COMMUNITY): Payer: Self-pay

## 2022-10-29 MED ORDER — BACLOFEN 10 MG PO TABS
5.0000 mg | ORAL_TABLET | Freq: Three times a day (TID) | ORAL | 1 refills | Status: DC | PRN
  Filled 2022-10-29: qty 60, 20d supply, fill #0

## 2022-11-01 ENCOUNTER — Telehealth: Payer: Self-pay | Admitting: Neurology

## 2022-11-01 NOTE — Telephone Encounter (Signed)
Pt has called to report that she has not been taking the donepezil (ARICEPT) 5 MG tablet , pt reports that it makes her vision blurry.  Pt would like a call to discuss trying another medication.

## 2022-11-02 NOTE — Telephone Encounter (Signed)
I called pt.  She stated she only took the donepezil 5mg  daily for 2 days and noted blurry vision. (Both eyes), no other symptoms. It resolved after she stopped the medication.  She is asking if there is another medication she can try. I called her back after noticing she has appt 11/16/2022 at 1315, arrive 1300 for check in.  She was ok to wait and speak about this later.  Appreciated call back.  She was not aware she had appt.

## 2022-11-03 ENCOUNTER — Encounter: Payer: Self-pay | Admitting: Orthopaedic Surgery

## 2022-11-03 ENCOUNTER — Other Ambulatory Visit (INDEPENDENT_AMBULATORY_CARE_PROVIDER_SITE_OTHER): Payer: Commercial Managed Care - PPO

## 2022-11-03 ENCOUNTER — Ambulatory Visit: Payer: Commercial Managed Care - PPO | Admitting: Orthopaedic Surgery

## 2022-11-03 ENCOUNTER — Other Ambulatory Visit (HOSPITAL_COMMUNITY): Payer: Self-pay

## 2022-11-03 VITALS — BP 142/84 | HR 47 | Ht 62.0 in | Wt 215.0 lb

## 2022-11-03 DIAGNOSIS — M5416 Radiculopathy, lumbar region: Secondary | ICD-10-CM

## 2022-11-03 MED ORDER — CELECOXIB 200 MG PO CAPS
200.0000 mg | ORAL_CAPSULE | Freq: Two times a day (BID) | ORAL | 3 refills | Status: DC
  Filled 2022-11-03: qty 60, 30d supply, fill #0

## 2022-11-04 NOTE — Progress Notes (Signed)
Office Visit Note   Patient: [name]           Date of Birth: 10/28/1960           MRN: [id number] Visit Date: 11/03/2022              Requested by: Alysia Penna, MD [address] [place],  [place] [address] PCP: Alysia Penna, MD   Assessment & Plan: Visit Diagnoses:  1. Lumbar radiculopathy     Plan: Celebrex prescribed which has worked well for her in the past.  Will set up for some physical therapy.  Recheck 2 months.    Follow-Up Instructions: Return in about 2 months (around 01/03/2023).   Orders:  Orders Placed This Encounter  Procedures   XR Lumbar Spine 2-3 Views   Ambulatory referral to Physical Therapy   Meds ordered this encounter  Medications   celecoxib (CELEBREX) 200 MG capsule    Sig: Take 1 capsule (200 mg total) by mouth 2 (two) times daily.    Dispense:  60 capsule    Refill:  3      Procedures: No procedures performed   Clinical Data: No additional findings.   Subjective: Chief Complaint  Patient presents with   Lower Back - Pain    HPI 62 year old female had lumbar fusion L3-4 L4-5 by Dr. Wille Celeste in 2011.  Patient states she was lifting some water several weeks ago and when she picked up the package of water she had increased back pain with radiation into her buttocks with difficulty walking sitting or standing pain that radiates into the left side.  She relates in the past when she took Celebrex she got good improvement.  Last lumbar MRI scan in 2020.  This was when she had injections with Dr. Alvester Morin.  PET scan showed stable fusion instrumented at 3445 and laminectomy with apparent fusion 5 1 noninstrumented.  There was progressive disc degeneration L2-3 with mild central stenosis at that time.  Review of Systems no chills or fever no associated bowel or bladder symptoms.   Objective: Vital Signs: BP (!) 142/84   Pulse (!) 47   Ht 5\' 2"  (1.575 m)   Wt 215 lb (97.5 kg)   BMI 39.32 kg/m   Physical Exam Constitutional:       Appearance: She is well-developed.  HENT:     Head: Normocephalic.     Right Ear: External ear normal.     Left Ear: External ear normal. There is no impacted cerumen.  Eyes:     Pupils: Pupils are equal, round, and reactive to light.  Neck:     Thyroid: No thyromegaly.     Trachea: No tracheal deviation.  Cardiovascular:     Rate and Rhythm: Normal rate.  Pulmonary:     Effort: Pulmonary effort is normal.  Abdominal:     Palpations: Abdomen is soft.  Musculoskeletal:     Cervical back: No rigidity.  Skin:    General: Skin is warm and dry.  Neurological:     Mental Status: She is alert and oriented to person, place, and time.  Psychiatric:        Behavior: Behavior normal.     Ortho Exam well-healed right and left paralumbar incisions nicely healed.  Tenderness lumbosacral junction.  Negative logroll hips.  Mild sciatic notch tenderness.  Specialty Comments:  No specialty comments available.  Imaging: No results found.   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 08/23/2016   Prediabetes 12/03/2015   OSA on CPAP 02/19/2015   Lung field abnormal 11/11/2014   Morbid obesity (HCC) 10/09/2014   Constipation 06/14/2014   Peripheral venous insufficiency 05/25/2013   Pulmonary HTN (HCC) 05/12/2013   Bilateral lower extremity edema 05/12/2013   Morbid obesity with BMI of 50.0-59.9, adult (HCC)    Shoulder joint pain 03/28/2013   Abnormal glucose level 02/20/2013   Alopecia 02/20/2013   Degeneration of lumbar intervertebral disc 02/20/2013    Hyperglycemia due to type 2 diabetes mellitus (HCC) 02/20/2013   Type 2 diabetes mellitus with other specified complication (HCC) 02/20/2013   Back pain, hx of prior back surgery 12/09/2011   Pancreatitis, mild, CT negative 12/08/2011   Fatty liver 12/08/2011   Substernal chest pain, suspected secondary to acute pancreatitis 12/06/2011   Hypertension 12/06/2011   Hypokalemia 12/06/2011   Hypothyroidism 12/06/2011   Anxiety 12/06/2011   BV (bacterial vaginosis)    Hyperlipidemia    Past Medical History:  Diagnosis Date   Anemia when she was young   Arthritis    Congestion of nasal sinus    Constipation    takes Colace every other day   DDD (degenerative disc disease), lumbar    Dependent edema    Diabetes mellitus without complication (HCC)    per pt had been diagnosed but then "number started going down so my doctor said i didnt have diabetes"   Dry eyes    uses Systane Eye drops daily as needed   GERD (gastroesophageal reflux disease) 12/06/2011   takes Nexium daily   Headache(784.0)    occasionally-d/t congestion   Heart murmur    History of bronchitis 6-7 yrs ago   Hyperlipidemia    Hypertension    takes Metoprolol and Diovan daily   Hypothyroidism 12/06/2011   takes Synthroid daily   Joint pain    Multiple allergies    takes Zyrtec daily;uses Nasonex daily as needed   Nocturia    Numbness    weakness-right hand   Onychomycosis    OSA on CPAP    Pancreatitis    Peripheral edema    takes Furosemide daily   Shingles    Urinary frequency    Venous insufficiency of leg     Family History  Problem Relation Age of Onset   Hypertension Mother    Dementia Mother    Alzheimer's disease Mother    Hypertension Father    Hypertension Sister    Kidney disease Sister        KIDNEL TRANSPLANT   Thyroid disease Sister    Thyroid disease Sister    Hypertension Sister    Breast cancer Maternal Aunt        >50; passed away from it   Breast cancer Maternal Aunt         >50    Past Surgical History:  Procedure Laterality Date   ABDOMINAL HYSTERECTOMY     uterine prolapse   BACK SURGERY     L4-5   CARDIAC SURGERY     CATARACT EXTRACTION, BILATERAL  2023   CHOLECYSTECTOMY     COLONOSCOPY  ESOPHAGOGASTRODUODENOSCOPY  5-61yrs ago   FOOT SURGERY Bilateral    KNEE ARTHROSCOPY     LAPAROSCOPIC GASTRIC SLEEVE RESECTION N/A 08/23/2016   Procedure: LAPAROSCOPIC GASTRIC SLEEVE RESECTION WITH UPPER ENDO;  Surgeon: Luretha Murphy, MD;  Location: [facility];  Service: General;  Laterality: N/A;   POSTERIOR LUMBAR FUSION  03/10/2009   L3-4 and L3-4 fusion, Dr. Hilda Lias   SHOULDER ARTHROSCOPY WITH ROTATOR CUFF REPAIR AND SUBACROMIAL DECOMPRESSION Right 08/23/2013   Procedure: RIGHT SHOULDER ARTHROSCOPY WITH  SUBACROMIAL DECOMPRESSION DISTAL CLAVICLE RESECTION AND  ROTATOR CUFF REPAIR ;  Surgeon: Senaida Lange, MD;  Location: [facility];  Service: Orthopedics;  Laterality: Right;   TUBAL LIGATION     Social History   Occupational History    Employer: [employer]    Comment: [employer]  Tobacco Use   Smoking status: Former    Current packs/day: 0.00    Types: Cigarettes    Quit date: 1993    Years since quitting: 31.7   Smokeless tobacco: Never   Tobacco comments:    quit smoking in 1993  Vaping Use   Vaping status: Never Used  Substance and Sexual Activity   Alcohol use: Yes    Alcohol/week: 0.0 standard drinks of alcohol    Comment: rarely;; mixed drink    Drug use: No   Sexual activity: Not Currently    Partners: Male    Birth control/protection: Surgical    Comment: Hysterectomy

## 2022-11-08 ENCOUNTER — Other Ambulatory Visit (HOSPITAL_COMMUNITY): Payer: Self-pay

## 2022-11-16 ENCOUNTER — Ambulatory Visit (INDEPENDENT_AMBULATORY_CARE_PROVIDER_SITE_OTHER): Payer: Commercial Managed Care - PPO | Admitting: Neurology

## 2022-11-16 VITALS — BP 140/82 | HR 52 | Ht 63.0 in | Wt 221.0 lb

## 2022-11-16 DIAGNOSIS — F439 Reaction to severe stress, unspecified: Secondary | ICD-10-CM | POA: Diagnosis not present

## 2022-11-16 DIAGNOSIS — Z818 Family history of other mental and behavioral disorders: Secondary | ICD-10-CM | POA: Diagnosis not present

## 2022-11-16 DIAGNOSIS — G4733 Obstructive sleep apnea (adult) (pediatric): Secondary | ICD-10-CM | POA: Diagnosis not present

## 2022-11-16 DIAGNOSIS — R413 Other amnesia: Secondary | ICD-10-CM

## 2022-11-16 NOTE — Progress Notes (Signed)
Subjective:    Patient ID: [name] is a 62 y.o. female.  HPI    Interim history:   [name] is a 62 year old female with an underlying complex medical history of hypertension, hyperlipidemia, hypothyroidism, allergies, history of pancreatitis, peripheral edema, anemia, arthritis, degenerative lumbar disc disease, chronic LBP, s/p spinal cord stimulator, edema, diabetes, reflux disease, recurrent headaches, sleep apnea, on CPAP therapy, venous insufficiency, and severe obesity with a BMI of over 40, status post multiple surgeries including hysterectomy, back surgery, spinal cord stimulator placement, cataract extractions, arthroscopic knee surgery, rotator cuff repair, gastric sleeve, who presents for follow-up consultation of her memory loss.  The patient is unaccompanied today.  I saw her on 08/16/2022, at which time she was referred by her primary care provider for a several month history of forgetfulness.  She had a MoCA score of 15 at the time.  I made a referral to neuropsychology.  She is scheduled to see Dr. Kieth Brightly in June 2025. She was advised to proceed with blood work and a brain MRI.  She had a brain MRI with and without contrast on 08/21/2022 and I reviewed the results:   IMPRESSION: 1. No evidence of an acute intracranial abnormality. 2. No age advanced or lobar predominant parenchymal atrophy. 3. There are a few small nonspecific T2 FLAIR hyperintense chronic insults scattered within the cerebral white matter, nonspecific but most often secondary to chronic small vessel ischemia.   She was notified of the test results via MyChart message.  She had extensive blood work on 08/16/2022 including vitamin D, A1c, ANA, RPR, B12, folate, TSH, ESR, vitamin B6, vitamin B1, ATN profile and APOE Alzheimer's risk profile.  Her labs were benign with the exception that her ATN profile favored the possibility of underlying Alzheimer's dementia.  Her APO E genotype showed E3/E4 which  supports an increased risk for late onset Alzheimer's disease with an increased lifetime risk for Alzheimer's dementia.  Based on these test results she was advised to start low-dose donepezil.  She called back reporting that she could not tolerate the donepezil due to blurry vision and she stopped the medication after a few days.   Today, 11/16/2022: She reports that she had blurry vision when she took the donepezil.  She took it for maybe a week or 2 as she recalls.  She started taking a supplement for memory loss over [employer].  She does not know the exact name and what it contains.  She reports that her mom has Alzheimer's disease and is not sure that mom actually takes any medication for it, she is 62 years old.  She has been more consistent with her CPAP, she likes her new nasal mask.  She is trying to get better with her hydration, she could do a little better with water intake she admits.  She drives, she did not have any trouble driving here.  She has some reluctance to drive to new places.  She still has stress, particularly with people hacking into her phone.  She feels that that is a problem.  She had a little bit of medication changes, no longer takes her Valium.  She takes her gabapentin twice daily.  She does not take baclofen consistently.  I reviewed her CPAP compliance data for the past 30 days, she used her machine 27 out of 30 days with percent use days greater than 4 hours at 73%, indicating adequate compliance, compliance improved in the past 2 weeks, consistently above 6 hours.  Average usage  of 6 hours and 3 minutes, residual AHI at goal at 2.9/h, leak on the higher side with some fluctuation, 95th percentile at 27.2 L/min on a pressure of 9 cm with EPR of 3.  Air leakage from the mask in the past week or so.   The patient's allergies, current medications, family history, past medical history, past social history, past surgical history and problem list were reviewed and updated as  appropriate.   Previously (copied from previous notes for reference):     08/16/2022: (She ) reports a several month history of forgetfulness.  She endorses significant stress in the past 2 years, reports that someone hacked into her phone. I reviewed your office note from 05/18/2022.  He was started on prednisone at the time for lumbar radicular pain.  She does take baclofen as needed as well as meloxicam 15 mg daily and diazepam 5 mg strength as needed for neck pain.  She reported a family history of Alzheimer's dementia, she did not have any blood work at the time.  Her MMSE was 28 at the time and animal naming was 7.  She had blood work through your office in September 2023.  Her CMP was benign at the time, CBC showed slightly below normal hematocrit at 36.3.  Lipid panel showed borderline total cholesterol of 204, triglycerides 209, LDL 105, TSH was 1.81, free T41.1, A1c 5.4.  She has been followed in our sleep clinic in the past few years, she has not been seen in follow-up for over 18 months.     I reviewed her CPAP compliance data for the past 30 days between 07/13/2022 and 08/11/2022, she used her machine 23 out of 30 days with percent use days greater than 4 hours at 43%, indicating suboptimal compliance, average usage of 4 hours at 15 minutes, residual AHI 1/h, leak on the higher side with the 95th percentile at 24.9 L/min on a pressure of 9 cm with EPR of 3.   She reports that she has trouble tolerating the nasal pillows, her nose gets itchy and congested.  She would be willing to try a different mask.   She does not smoke, quit in 1993, she drinks alcohol infrequently, about once a month, she tries to hydrate well with water.  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  Her mom is 95 and has dementia, she lives in [place] with her other daughter.  Patient is the youngest of a total of 6 siblings, she has a 33 year old brother who has memory issues and the sister in  [place].  Her oldest sister lived to be 73 or 66 years old. One brother died at 78 with AIDS, one brother died at 12 suddenly.  She lives with a roommate. She takes Valium once daily on average, she does not currently take any baclofen, she does take Robaxin daily and gabapentin daily.  She reports taking it once a day only.    She saw Shawnie Dapper, NP on 12/01/2020, at which time she was compliant with her CPAP of 9 cm.  She reported recurrent sneezing and sinus infections and felt that the pressure was too high.  She was encouraged to pursue a mask fit appointment.   She saw Shawnie Dapper, NP on 10/19/2018, at which time she was compliant with her CPAP of 9 cm with good apnea control but leak was on the higher side.   10/06/17: 62 year old right-handed woman with an underlying medical history of hypertension,  hyperlipidemia, diabetes, arthritis, hypothyroidism, reflux disease, history of pancreatitis, deemed secondary to medication, degenerative spine disease with low back pain, lower extremity edema and severe obesity, who presents for follow-up consultation of her sleep apnea, well established on CPAP therapy. The patient is unaccompanied today. I last saw her on she reports doing well with CPAP. She had undergone laparoscopic gastric sleeve bariatric surgery on 08/23/2016. She had lost weight. We talked about potentially putting her back in for sleep study to reevaluate after weight loss.   I reviewed her CPAP compliance data from 09/05/2017 through 10/04/2017 which is a total of 30 days, during which time she used her CPAP every night with percent used days greater than 4 hours at 97%, indicating excellent compliance with an average usage of 7 hours and 10 minutes, residual AHI 0.6 per hour, leak on the higher side for the 95th percentile at 26.3 mL/m on a pressure of 9 cm with EPR of 3. She reports doing well with her CPAP, up to date with her supplies typically. Using nasal pillows. Weight has plateaued a  little bit. She has had recent issues with blurry vision and watery eyes, wonders if it's the Lyrica. She is going to talk to Dr. Alvester Morin about this. Her GYN started her on low-dose gabapentin for night sweats. She has intermittent lower extremity edema and uses Lasix as needed. She may be due for an eye examination, goes to [facility] eye care.   I saw her on 08/18/2015, at which time she was doing rather well on CPAP therapy and was fully compliant with it. She had undergone epidural steroid injections in the past for her back pain which were helpful.    I reviewed her CPAP compliance data from 09/05/2016 through 10/04/2016, which is a total of 30 days, during which time she used her CPAP every night with percent used days greater than 4 hours at 100%, indicating superb compliance with an average usage of 7 hours and 43 minutes, residual AHI low at 0.7 per hour, leak acceptable with the 95th percentile at 8.5 L/m on a pressure of 9 cm with EPR of 3.    I first met her on 12/04/2014 at the request of her primary care physician, at which time she reported a family history of OSA, snoring and excessive daytime somnolence. I invited her back for sleep study. She had a split-night sleep study on 01/03/2015. I went over her test results with her in detail today. Baseline sleep efficiency was 68.7% with a latency to sleep of 18 minutes and wake after sleep onset of 45.5 minutes with severe sleep fragmentation noted. She had arousal index of 9 per hour. She had an increased percentage of stage 1 sleep, an increased percentage of slow-wave sleep and REM sleep of 3.6% with a prolonged REM latency. She had no significant PLMS, EKG or EEG changes. She had moderate to loud snoring. Total AHI was 30.1 per hour, average oxygen saturation 91%, nadir was 64%. She was then titrated on CPAP. Sleep efficiency was 72.4% during the second part of the study with sleep latency of 17.5 minutes and wake after sleep onset of 60 minutes with  moderate sleep fragmentation noted. She had slow-wave sleep at 22.1%, REM sleep was 35.6%. Average oxygen saturation was 93%, nadir was 86%. CPAP was titrated from 5 cm to 9 cm. AHI was 1 per hour on the final pressure with supine REM sleep achieved. Based on her test results are prescribed CPAP therapy for home  use at a pressure of 9 cm.    In the interim, she was seen by Darrol Angel on 02/19/2015, at which time she was fully compliant with CPAP therapy and doing well.    I reviewed her CPAP compliance data from 07/15/2015 through 08/13/2015 which is a total of 30 days during which time she used her machine every night with percent used days greater than 4 hours at 100%, indicating superb compliance with an average usage of 5 hours and 37 minutes, residual AHI 0.5 per hour, leak low with the 95th percentile at 3.4 L/m on a pressure of 9 cm with EPR of 3.    12/04/2014: She reports snoring and excessive daytime somnolence. I reviewed your office note from 11/12/2014, which you kindly included.    She went to the ER on 11/09/14, after she woke up in the middle of the night a couple with SOB and a sense of choking. She has a family history of obstructive sleep apnea in her older sister who uses a CPAP machine. During the emergency room visit she was noted to have more lower extremity swelling. She was given IV Lasix 1 and her maintenance Lasix was increased to 30 mg from 20 mg daily. She had a chest x-ray and then a CT angiogram of her chest which showed: 1. No acute cardiopulmonary disease. Specifically, no evidence of pulmonary embolism to the level of the bilateral subsegmental pulmonary arteries.  2. Borderline cardiomegaly. 3. Coronary artery calcifications. 4. Indeterminate punctate (approximately 2 mm) right middle lobe pulmonary nodule. If the patient is at high risk for bronchogenic carcinoma, follow-up chest CT at 1 year is recommended. If the patient is at low risk, no follow-up is  needed. She reports a bedtime of around 10 PM. She falls asleep quickly. She is separated. Currently her 50 year old son lives with her but he is moving out. She has a 65 year old daughter who is on her own. 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 Her rise time is 6:30 AM. She does not wake up rested. Her Epworth sleepiness score is 11 out of 24 today, her fatigue score is 37 out of 63. She has restless leg symptoms. These are intermittent. She has occasional leg cramps as well. She has nocturia, usually once or twice per night. She does not drink caffeine daily. She quit smoking in 1992. She drinks alcohol very rarely.      Her Past Medical History Is Significant For: Past Medical History:  Diagnosis Date   Anemia when she was young   Arthritis    Congestion of nasal sinus    Constipation    takes Colace every other day   DDD (degenerative disc disease), lumbar    Dependent edema    Diabetes mellitus without complication (HCC)    per pt had been diagnosed but then "number started going down so my doctor said i didnt have diabetes"   Dry eyes    uses Systane Eye drops daily as needed   GERD (gastroesophageal reflux disease) 12/06/2011   takes Nexium daily   Headache(784.0)    occasionally-d/t congestion   Heart murmur    History of bronchitis 6-7 yrs ago   Hyperlipidemia    Hypertension    takes Metoprolol and Diovan daily   Hypothyroidism 12/06/2011   takes Synthroid daily   Joint pain    Multiple allergies    takes Zyrtec daily;uses Nasonex daily as needed   Nocturia    Numbness  weakness-right hand   Onychomycosis    OSA on CPAP    Pancreatitis    Peripheral edema    takes Furosemide daily   Shingles    Urinary frequency    Venous insufficiency of leg     Her Past Surgical History Is Significant For: Past Surgical History:  Procedure Laterality Date   ABDOMINAL HYSTERECTOMY     uterine prolapse   BACK SURGERY     L4-5   CARDIAC SURGERY     CATARACT  EXTRACTION, BILATERAL  2023   CHOLECYSTECTOMY     COLONOSCOPY     ESOPHAGOGASTRODUODENOSCOPY  5-72yrs ago   FOOT SURGERY Bilateral    KNEE ARTHROSCOPY     LAPAROSCOPIC GASTRIC SLEEVE RESECTION N/A 08/23/2016   Procedure: LAPAROSCOPIC GASTRIC SLEEVE RESECTION WITH UPPER ENDO;  Surgeon: Luretha Murphy, MD;  Location: [facility];  Service: General;  Laterality: N/A;   POSTERIOR LUMBAR FUSION  03/10/2009   L3-4 and L3-4 fusion, Dr. Hilda Lias   SHOULDER ARTHROSCOPY WITH ROTATOR CUFF REPAIR AND SUBACROMIAL DECOMPRESSION Right 08/23/2013   Procedure: RIGHT SHOULDER ARTHROSCOPY WITH  SUBACROMIAL DECOMPRESSION DISTAL CLAVICLE RESECTION AND  ROTATOR CUFF REPAIR ;  Surgeon: Senaida Lange, MD;  Location: [facility] OR;  Service: Orthopedics;  Laterality: Right;   TUBAL LIGATION      Her Family History Is Significant For: Family History  Problem Relation Age of Onset   Hypertension Mother    Dementia Mother    Alzheimer's disease Mother    Hypertension Father    Hypertension Sister    Kidney disease Sister        KIDNEL TRANSPLANT   Thyroid disease Sister    Thyroid disease Sister    Hypertension Sister    Breast cancer Maternal Aunt        >50; passed away from it   Breast cancer Maternal Aunt        >50    Her Social History Is Significant For: Social History   Socioeconomic History   Marital status: Divorced    Spouse name: Not on file   Number of children: 2   Years of education: College   Highest education level: Not on file  Occupational History    Employer: [employer]    Comment: [employer]  Tobacco Use   Smoking status: Former    Current packs/day: 0.00    Types: Cigarettes    Quit date: 1993    Years since quitting: 31.8   Smokeless tobacco: Never   Tobacco comments:    quit smoking in 1993  Vaping Use   Vaping status: Never Used  Substance and Sexual Activity   Alcohol use: Yes    Alcohol/week: 0.0 standard drinks of alcohol    Comment: rarely;; mixed drink    Drug use:  No   Sexual activity: Not Currently    Partners: Male    Birth control/protection: Surgical    Comment: Hysterectomy  Other Topics Concern   Not on file  Social History Narrative   Caffeine: Coffee   Social Determinants of Health   Financial Resource Strain: Not on file  Food Insecurity: Not on file  Transportation Needs: Not on file  Physical Activity: Not on file  Stress: Not on file  Social Connections: Unknown (09/11/2021)   [facility]   Social Network    Social Network: Not on file    Her Allergies Are:  Allergies  Allergen Reactions   Metronidazole  Developed pancreatitis after taking this   Crestor [Rosuvastatin]    Tramadol Hcl     Ultram- eye edema and arm rash    Adhesive [Tape] Itching and Rash   Latex Itching and Rash   Penicillins Rash    Has patient had a PCN reaction causing immediate rash, facial/tongue/throat swelling, SOB or lightheadedness with hypotension: No Has patient had a PCN reaction causing severe rash involving mucus membranes or skin necrosis: No Has patient had a PCN reaction that required hospitalization No Has patient had a PCN reaction occurring within the last 10 years: No If all of the above answers are "NO", then may proceed with Cephalosporin use.   Vesicare [Solifenacin] Rash  :   Her Current Medications Are:  Outpatient Encounter Medications as of 11/16/2022  Medication Sig   albuterol (PROVENTIL HFA;VENTOLIN HFA) 108 (90 BASE) MCG/ACT inhaler Inhale 1-2 puffs into the lungs every 6 (six) hours as needed for wheezing or shortness of breath.   aspirin EC 81 MG tablet Take 81 mg by mouth daily.    azelastine (ASTELIN) 0.1 % nasal spray PLACE 1 SPRAY PER NOSTRIL ONCE DAILY AT NIGHT   baclofen (LIORESAL) 10 MG tablet Take 0.5-1 tablets (5-10 mg total) by mouth every 8 (eight) hours as needed for muscle spasms   celecoxib (CELEBREX) 200 MG capsule Take 1 capsule (200 mg total) by mouth 2 (two) times  daily.   clobetasol ointment (TEMOVATE) 0.05 % Apply as directed twice daily for 2 weeks   diazepam (VALIUM) 5 MG tablet Take 1 tablet by mouth once daily as needed for neck pain   donepezil (ARICEPT) 5 MG tablet Take 1 tablet (5 mg total) by mouth at bedtime.   esomeprazole (NEXIUM) 40 MG capsule Take 1 capsule (40 mg total) by mouth daily.   esomeprazole (NEXIUM) 40 MG capsule Take 1 capsule (40 mg total) by mouth daily.   fluticasone (FLONASE) 50 MCG/ACT nasal spray PLACE 2 SPRAYS IN EACH NOSTRIL DAILY AS DIRECTED   fluticasone (FLONASE) 50 MCG/ACT nasal spray Place 1 spray in each nostril once a day   furosemide (LASIX) 20 MG tablet Take 3 tablets (60 mg total) by mouth as needed. NEED OV.   gabapentin (NEURONTIN) 300 MG capsule Take 1 capsule (300 mg total) by mouth 3 (three) times daily.   levothyroxine (SYNTHROID) 75 MCG tablet Take 1 tablet (75 mcg total) by mouth every morning on an empty stomach.   linaclotide (LINZESS) 72 MCG capsule Take 72 mcg by mouth daily as needed.   loratadine (CLARITIN) 10 MG tablet Take 10 mg by mouth daily.   metoprolol tartrate (LOPRESSOR) 25 MG tablet Take 1 tablet (25 mg total) by mouth 2 (two) times daily.   nystatin cream (MYCOSTATIN) Apply to affected area 2 times a day for up to 7 days.   olmesartan (BENICAR) 20 MG tablet Take 1 tablet (20 mg total) by mouth daily.   Omega-3 Fatty Acids (FISH OIL) 1000 MG CAPS Take 1,000 mg by mouth daily.   OVER THE COUNTER MEDICATION Gets from [place] Name: Silijit (for memory)   TURMERIC CURCUMIN PO Take by mouth daily.   vitamin C (ASCORBIC ACID) 500 MG tablet Take 500 mg by mouth daily.   VITAMIN D PO Take 1 tablet by mouth daily at 6 (six) AM.   [DISCONTINUED] azelastine (ASTELIN) 0.1 % nasal spray Place 1 spray into both nostrils in the morning.   potassium chloride SA (K-DUR) 20 MEQ tablet TAKE 1 TABLET (20 MEQ  TOTAL) BY MOUTH DAILY.   [DISCONTINUED] Azelastine HCl 137 MCG/SPRAY SOLN PLACE 1 SPRAY IN EACH  NOSTRIL ONCE NIGHTLY   No facility-administered encounter medications on file as of 11/16/2022.  :  Review of Systems:  Out of a complete 14 point review of systems, all are reviewed and negative with the exception of these symptoms as listed below:  Review of Systems  Neurological:        Pt states seems to be better.  She stopped donepezil due to side effects.  She started taking an OTC from [place] (silijit for memory).  ??      Objective:  Neurological Exam  Physical Exam Physical Examination:   Vitals:   11/16/22 1329  BP: (!) 140/82  Pulse: (!) 52    General Examination: The patient is a very pleasant 62 y.o. female in no acute distress. She appears well-developed and well-nourished and well groomed.  Good spirits.  HEENT: Normocephalic, atraumatic, pupils are equal, round and reactive to light and accommodation. Extraocular tracking is good without limitation to gaze excursion or nystagmus noted. Corrective eyeglasses in place. Normal smooth pursuit is noted. Hearing is grossly intact. Face is symmetric with normal facial animation and normal facial sensation to light touch, temperature and vibration. Speech is clear with no dysarthria noted. There is no hypophonia. There is no lip, neck/head, jaw or voice tremor. Neck is supple with full range of passive and active motion. There are no carotid bruits on auscultation. Oropharynx exam reveals: mild mouth dryness, tongue protrudes centrally and palate elevates symmetrically.    Chest: Clear to auscultation without wheezing, rhonchi or crackles noted.   Heart: S1+S2+0, regular and normal without murmurs, rubs or gallops noted.    Abdomen: Soft, non-tender and non-distended.   Extremities: There is no obvious swelling in the distal lower extremities around the ankles.    Skin: Warm and dry without trophic changes noted. There are no varicose veins.   Musculoskeletal: exam reveals no obvious joint deformities.     Neurologically:  Mental status: The patient is awake, alert and oriented in all 4 spheres. Her immediate and remote memory, attention, language skills and fund of knowledge are fair. There is no evidence of aphasia, agnosia, apraxia or anomia. Speech is clear with normal prosody and enunciation. Thought process is linear. Mood is normal and affect is normal.       08/16/2022    2:26 PM  Montreal Cognitive Assessment   Visuospatial/ Executive (0/5) 2  Naming (0/3) 1  Attention: Read list of digits (0/2) 2  Attention: Read list of letters (0/1) 1  Attention: Serial 7 subtraction starting at 100 (0/3) 0  Language: Repeat phrase (0/2) 2  Language : Fluency (0/1) 1  Abstraction (0/2) 2  Delayed Recall (0/5) 0  Orientation (0/6) 4  Total 15    On 08/16/2022: CDT: 1/4, category fluency 12/min.  Cranial nerves II - XII are as described above under HEENT exam.  Motor exam: Normal bulk, strength and tone is noted. There is no drift, tremor or rebound.  Reflexes 1+ in the upper extremities and absent in the lower extremities.   Fine motor skills and coordination: grossly intact.  Cerebellar testing: No dysmetria or intention tremor. There is no truncal or gait ataxia.  Sensory exam: intact to light touch in the upper and lower extremities.  Gait, station and balance: She stands easily. No veering to one side is noted. No leaning to one side is noted. Posture is age-appropriate and  stance is narrow based. Gait shows normal stride length and normal pace. No problems turning are noted.    Assessment and Plan:    In summary, OREA ROESEL is a 62 year old female with an underlying complex medical history of hypertension, hyperlipidemia, hypothyroidism, allergies, history of pancreatitis, peripheral edema, anemia, arthritis, degenerative lumbar disc disease, chronic LBP, s/p spinal cord stimulator, edema, diabetes, reflux disease, recurrent headaches, sleep apnea, on CPAP therapy, venous  insufficiency, and obesity, status post multiple surgeries including hysterectomy, back surgery, spinal cord stimulator placement, cataract extractions, arthroscopic knee surgery, rotator cuff repair, gastric sleeve, who presents for follow-up consultation of her memory loss.  She had a MoCA score of 15/30 on 08/16/2022, but I believe her score was confounded by stress and certain potentially sedating medications.  She is still on some medications, no longer takes Valium and does not take baclofen regularly, gabapentin is twice daily.  We did additional workup and I referred her to neuropsychology.  She has a pending appointment with Dr. Kieth Brightly in June 2025.  Her brain MRI with and without contrast on 08/21/2022 showed benign findings.  She had mild white matter changes and no age advanced or significant lobar predominant atrophy.   She is consistent with her CPAP and is commended for it.  She could not tolerate donepezil.  She is off of it currently.  Blood work from July 2024 showed an abnormal ATN profile and her APOE Alzheimer's risk profile favored the possibility of underlying Alzheimer's dementia.  She reports dementia and her mom who is currently 52 years old.  We may consider memantine next.  For now, we mutually agreed to have her focus on healthy lifestyle, good nutrition, proper hydration with water, staying consistent with her CPAP and she is advised to keep her appointment with neuropsychology next year.  We will plan a follow-up afterwards.  We talked about the importance of stress reduction as well.   I answered all her questions today and she was in agreement with our plan. I spent 30 minutes in total face-to-face time and in reviewing records during pre-charting, more than 50% of which was spent in counseling and coordination of care, reviewing test results, reviewing medications and treatment regimen and/or in discussing or reviewing the diagnosis of memory loss, the prognosis and treatment  options. Pertinent laboratory and imaging test results that were available during this visit with the patient were reviewed by me and considered in my medical decision making (see chart for details).

## 2022-11-16 NOTE — Patient Instructions (Signed)
It was nice to see you again.  We will await your appointment with Dr. Kieth Brightly in June.  Please hydrate better with water, drink 6-8 cups of water per day, 8 oz size each.  Please call us back with the name of the supplement you are taking for memory (from [employer]).  I am sorry, you could not tolerate the donepezil, but I am glad to hear you are feeling a little better.  Follow up in about 9 months.

## 2022-11-17 ENCOUNTER — Ambulatory Visit: Payer: Commercial Managed Care - PPO | Admitting: Rehabilitative and Restorative Service Providers"

## 2022-11-17 ENCOUNTER — Encounter: Payer: Self-pay | Admitting: Rehabilitative and Restorative Service Providers"

## 2022-11-17 DIAGNOSIS — M6281 Muscle weakness (generalized): Secondary | ICD-10-CM | POA: Diagnosis not present

## 2022-11-17 DIAGNOSIS — R262 Difficulty in walking, not elsewhere classified: Secondary | ICD-10-CM

## 2022-11-17 DIAGNOSIS — M5459 Other low back pain: Secondary | ICD-10-CM | POA: Diagnosis not present

## 2022-11-17 DIAGNOSIS — R293 Abnormal posture: Secondary | ICD-10-CM

## 2022-11-17 NOTE — Therapy (Signed)
OUTPATIENT PHYSICAL THERAPY THORACOLUMBAR EVALUATION   Patient Name: [name] MRN: [id number] DOB:[birth year], 62 y.o., female Today's Date: 11/17/2022  END OF SESSION:  PT End of Session - 11/17/22 1644     Visit Number 1    Number of Visits 11    Date for PT Re-Evaluation 01/12/23    Progress Note Due on Visit 11    PT Start Time 1557    PT Stop Time 1643    PT Time Calculation (min) 46 min    Activity Tolerance Patient tolerated treatment well;No increased pain    Behavior During Therapy WFL for tasks assessed/performed             Past Medical History:  Diagnosis Date   Anemia when she was young   Arthritis    Congestion of nasal sinus    Constipation    takes Colace every other day   DDD (degenerative disc disease), lumbar    Dependent edema    Diabetes mellitus without complication (HCC)    per pt had been diagnosed but then "number started going down so my doctor said i didnt have diabetes"   Dry eyes    uses Systane Eye drops daily as needed   GERD (gastroesophageal reflux disease) 12/06/2011   takes Nexium daily   Headache(784.0)    occasionally-d/t congestion   Heart murmur    History of bronchitis 6-7 yrs ago   Hyperlipidemia    Hypertension    takes Metoprolol and Diovan daily   Hypothyroidism 12/06/2011   takes Synthroid daily   Joint pain    Multiple allergies    takes Zyrtec daily;uses Nasonex daily as needed   Nocturia    Numbness    weakness-right hand   Onychomycosis    OSA on CPAP    Pancreatitis    Peripheral edema    takes Furosemide daily   Shingles    Urinary frequency    Venous insufficiency of leg    Past Surgical History:  Procedure Laterality Date   ABDOMINAL HYSTERECTOMY     uterine prolapse   BACK SURGERY     L4-5   CARDIAC SURGERY     CATARACT EXTRACTION, BILATERAL  2023   CHOLECYSTECTOMY     COLONOSCOPY     ESOPHAGOGASTRODUODENOSCOPY  5-85yrs ago   FOOT SURGERY Bilateral    KNEE ARTHROSCOPY      LAPAROSCOPIC GASTRIC SLEEVE RESECTION N/A 08/23/2016   Procedure: LAPAROSCOPIC GASTRIC SLEEVE RESECTION WITH UPPER ENDO;  Surgeon: Luretha Murphy, MD;  Location: [facility];  Service: General;  Laterality: N/A;   POSTERIOR LUMBAR FUSION  03/10/2009   L3-4 and L3-4 fusion, Dr. Hilda Lias   SHOULDER ARTHROSCOPY WITH ROTATOR CUFF REPAIR AND SUBACROMIAL DECOMPRESSION Right 08/23/2013   Procedure: RIGHT SHOULDER ARTHROSCOPY WITH  SUBACROMIAL DECOMPRESSION DISTAL CLAVICLE RESECTION AND  ROTATOR CUFF REPAIR ;  Surgeon: Senaida Lange, MD;  Location: [facility];  Service: Orthopedics;  Laterality: Right;   TUBAL LIGATION     Patient Active Problem List   Diagnosis Date Noted   Memory change 12/25/2020   Chronic pain syndrome 05/04/2019   Raynaud's disease 04/13/2019   Lumbar radiculopathy 12/18/2018   Spinal stenosis of lumbar region with neurogenic claudication 12/18/2018   Post laminectomy syndrome 12/18/2018   Bilateral primary osteoarthritis of knee 12/27/2016   History of excision of intestinal structure 09/07/2016   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 08/23/2016   Prediabetes 12/03/2015   OSA on CPAP 02/19/2015   Lung field  abnormal 11/11/2014   Morbid obesity (HCC) 10/09/2014   Constipation 06/14/2014   Peripheral venous insufficiency 05/25/2013   Pulmonary HTN (HCC) 05/12/2013   Bilateral lower extremity edema 05/12/2013   Morbid obesity with BMI of 50.0-59.9, adult (HCC)    Shoulder joint pain 03/28/2013   Abnormal glucose level 02/20/2013   Alopecia 02/20/2013   Degeneration of lumbar intervertebral disc 02/20/2013   Hyperglycemia due to type 2 diabetes mellitus (HCC) 02/20/2013   Type 2 diabetes mellitus with other specified complication (HCC) 02/20/2013   Back pain, hx of prior back surgery 12/09/2011   Pancreatitis, mild, CT negative 12/08/2011   Fatty liver 12/08/2011   Substernal chest pain, suspected secondary to acute pancreatitis 12/06/2011   Hypertension  12/06/2011   Hypokalemia 12/06/2011   Hypothyroidism 12/06/2011   Anxiety 12/06/2011   BV (bacterial vaginosis)    Hyperlipidemia     PCP: Alysia Penna, MD  REFERRING PROVIDER: Eldred Manges, MD  REFERRING DIAG:  Diagnosis  M54.16 (ICD-10-CM) - Lumbar radiculopathy    Rationale for Evaluation and Treatment: Rehabilitation  THERAPY DIAG:  Abnormal posture - Plan: PT plan of care cert/re-cert  Other low back pain - Plan: PT plan of care cert/re-cert  Muscle weakness (generalized) - Plan: PT plan of care cert/re-cert  Difficulty in walking, not elsewhere classified - Plan: PT plan of care cert/re-cert  ONSET DATE: About a month ago  SUBJECTIVE:                                                                                                                                                                                           SUBJECTIVE STATEMENT: [name] had a back surgery in 2011.  About a month ago, she "tweaked" her back lifting a case of water.  Things have improved but slowly.  She would like to avoid future surgery.  She has a spine stimulator and can't sleep on the left side.  PERTINENT HISTORY:  OA, lumbar DDD, fusion L3-5, Type 2 DM, HLD, HTN, hypothyroid, abdominal hysterectomy, previous knee and foot surgeries, Rt RTC repair, morbid obesity  PAIN:  Are you having pain? Yes 3-5/10.  Worst with lying on the left, sit to stand, prolonged postures (particularly sitting).  PRECAUTIONS: Back  RED FLAGS: None   WEIGHT BEARING RESTRICTIONS: No  FALLS:  Has patient fallen in last 6 months? No  LIVING ENVIRONMENT: Lives with: lives alone Lives in: House/apartment Stairs:  No issues Has following equipment at home: Single point cane  OCCUPATION: Works in medical records at [employer]  PLOF: Independent  PATIENT GOALS: Get back to normal activities without being limited by her back or  knees  NEXT MD VISIT: Follow-up in 3 months  OBJECTIVE:  Note: Objective  measures were completed at Evaluation unless otherwise noted.  DIAGNOSTIC FINDINGS:  AP lateral lumbar images are obtained and reviewed this shows instrumented  fusion L3-4 L4-5.  There is disc space narrowing and some sclerosis on the  posterior aspect of the endplate at L2-3.  Notes of loosening and single  cage appears stable.   Impression: Previous lumbar fusions instrumented L3-4, L4-5.  Adjacent  level progression L2-3.  No listhesis.  PATIENT SURVEYS:  FOTO 50 (risk-adjusted 42, Goal 64 in 11 vists)  SCREENING FOR RED FLAGS: Bowel or bladder incontinence: No Spinal tumors: No Cauda equina syndrome: No Compression fracture: No Abdominal aneurysm: No  COGNITION: Overall cognitive status: Within functional limits for tasks assessed     SENSATION: Not with this episode, previously on the right side  MUSCLE LENGTH: Hamstrings: Right 45 deg; Left 45 deg  POSTURE: rounded shoulders, forward head, decreased lumbar lordosis, and flexed trunk   LUMBAR ROM:   AROM 11/17/2022  Flexion   Extension 5  Right lateral flexion   Left lateral flexion   Right rotation   Left rotation    (Blank rows = not tested)  LOWER EXTREMITY ROM:     Passive  Left/Right 11/17/2022   Hip flexion 85/90   Hip extension    Hip abduction    Hip adduction    Hip internal rotation 5/15   Hip external rotation 35/35   Knee flexion    Knee extension    Ankle dorsiflexion    Ankle plantarflexion    Ankle inversion    Ankle eversion     (Blank rows = not tested)  LOWER EXTREMITY STRENGTH:  Deferred at evaluation  MMT Left/Right 11/17/2022   Hip flexion    Hip extension    Hip abduction    Hip adduction    Hip internal rotation    Hip external rotation    Knee flexion    Knee extension    Ankle dorsiflexion    Ankle plantarflexion    Ankle inversion    Ankle eversion     (Blank rows = not tested)  GAIT: Distance walked: 50 feet Assistive device utilized: None Level of  assistance: Complete Independence Comments: Tihanna walks in a forward flexed posture.  She notes she is more comfortable using a shopping cart at the store and she does not typically walk more than about 10 minutes due to low back fatigue and knee pain.  TODAY'S TREATMENT:                                                                                                                              DATE: 11/17/2022 Gentle Lumbar extension AROM 10 x 3 seconds Shoulder blade pinches 10 x 5 seconds  Heel to toe raises with transversus abdominus activation 10 x 3 seconds Yoga bridge 10 x 3 seconds  Functional Activities: Reviewed  spine anatomy with spine model, practical lifting techniques like a modified diagonal squat lift and golfers lift, also reviewed bed mobility with logroll and modified golfers lift, discussed posture and the importance of changing positions frequently, discussed home walking and exercise program.   PATIENT EDUCATION:  Education details: See above Person educated: Patient Education method: Explanation, Demonstration, Tactile cues, Verbal cues, and Handouts Education comprehension: verbalized understanding, returned demonstration, verbal cues required, tactile cues required, and needs further education  HOME EXERCISE PROGRAM: Access Code: [email or web address] URL: [email or web address] Date: 11/17/2022 Prepared by: Pauletta Browns  Exercises - Standing Lumbar Extension at Wall - Forearms  - 3-5 x daily - 7 x weekly - 1 sets - 5 reps - 3 seconds hold - Yoga Bridge  - 2-3 x daily - 7 x weekly - 1 sets - 10 reps - 3 seconds hold - Heel Toe Raises with Counter Support  - 3-5 x daily - 7 x weekly - 1 sets - 10 reps - 3 seconds hold - Standing Scapular Retraction  - 5 x daily - 7 x weekly - 1 sets - 5 reps - 5 second hold  ASSESSMENT:  CLINICAL IMPRESSION: Patient is a 62 y.o. female who was seen today for physical therapy evaluation and treatment for Physical Therapy-  L3-4, L4-5 Fusion with L2-3 DDD Evaluate and Treat.   Diagnosis  M54.16 (ICD-10-CM) - Lumbar radiculopathy  Redena has a history of a previous L3-5 fusion in 2011.  She has been doing pretty well until she went to lift a case of bottled water about a month ago.  Symptoms have been improving since that time, although she is concerned with the amount of pain that she did have and that is taking as long as it has to get back to normal.  She would like to avoid future surgery, get stronger and be more fit.  Postural abnormalities and weaknesses based on her functional self-report were most evident today and were being addressed with her day 1 home exercise program.  Shanine's prognosis to meet the below listed goals is good with the recommended plan of care.  OBJECTIVE IMPAIRMENTS: decreased activity tolerance, decreased endurance, decreased knowledge of condition, difficulty walking, decreased ROM, decreased strength, decreased safety awareness, impaired perceived functional ability, impaired flexibility, improper body mechanics, postural dysfunction, obesity, and pain.   ACTIVITY LIMITATIONS: carrying, lifting, bending, sitting, standing, squatting, sleeping, bed mobility, and locomotion level  PARTICIPATION LIMITATIONS: shopping, community activity, and occupation  PERSONAL FACTORS: OA, lumbar DDD, fusion L3-5, Type 2 DM, HLD, HTN, hypothyroid, abdominal hysterectomy, previous knee and foot surgeries, Rt RTC repair, morbid obesity are also affecting patient's functional outcome.   REHAB POTENTIAL: Good  CLINICAL DECISION MAKING: Stable/uncomplicated  EVALUATION COMPLEXITY: Low   GOALS: Goals reviewed with patient? Yes  SHORT TERM GOALS: Target date: 12/15/2022  Treacy will be independent with her day 1 home exercise program Baseline: Started 11/17/2022 Goal status: INITIAL  2.  Maraiya will be able to stand in a more upright posture due to decreased hip flexors tightness and improved  lumbar extensor strength Baseline: Stands in a forward flexed posture at evaluation 11/17/2022 Goal status: INITIAL   LONG TERM GOALS: Target date: 01/12/2023  Improve FOTO to 64 in 11 visits (50, risk adjusted 42 at evaluation) Baseline: 50, risk adjusted 42 Goal status: INITIAL  2.  Ilyssa will report low back pain consistently 0-3/10 on the numeric pain rating scale Baseline: 3-5/10 Goal status: INITIAL  3.  Improve bilateral hip flexors flexibility to  at least 90 degrees Baseline: 80/90 degrees Goal status: INITIAL  4.  Improve bilateral quadriceps and lumbar extensors strength as assessed by improved standing and walking endurance, FOTO scores and objective measures Baseline: Stands in a flexed posture, limited to 10 minutes or less of walking and objective measures deferred at evaluation secondary to these findings and other functional deficits Goal status: INITIAL  5.  Zurielle will be independent with her long-term maintenance home exercise program at discharge Baseline: Started 11/17/2022 Goal status: INITIAL  PLAN:  PT FREQUENCY: 1-2x/week  PT DURATION: 8 weeks  PLANNED INTERVENTIONS: 97110-Therapeutic exercises, 97530- Therapeutic activity, 97112- Neuromuscular re-education, 97535- Self Care, 16109- Manual therapy, Patient/Family education, Dry Needling, Cryotherapy, and Moist heat.  PLAN FOR NEXT SESSION: Review day 1 home exercise program.  Focus on postural education, body mechanics, lumbar and quadricep strengthening to address low back and bilateral knee pain.  Prepare for independent rehabilitation.   Cherlyn Cushing, PT, MPT 11/17/2022, 5:00 PM

## 2022-11-26 ENCOUNTER — Emergency Department (HOSPITAL_COMMUNITY): Payer: Commercial Managed Care - PPO

## 2022-11-26 ENCOUNTER — Encounter (HOSPITAL_COMMUNITY): Payer: Self-pay | Admitting: Emergency Medicine

## 2022-11-26 ENCOUNTER — Other Ambulatory Visit: Payer: Self-pay

## 2022-11-26 ENCOUNTER — Emergency Department (HOSPITAL_COMMUNITY)
Admission: EM | Admit: 2022-11-26 | Discharge: 2022-11-26 | Disposition: A | Discharge status: Home / Self Care | Payer: Commercial Managed Care - PPO | Attending: Emergency Medicine | Admitting: Emergency Medicine

## 2022-11-26 DIAGNOSIS — I10 Essential (primary) hypertension: Secondary | ICD-10-CM | POA: Diagnosis not present

## 2022-11-26 DIAGNOSIS — Z7982 Long term (current) use of aspirin: Secondary | ICD-10-CM | POA: Diagnosis not present

## 2022-11-26 DIAGNOSIS — Z79899 Other long term (current) drug therapy: Secondary | ICD-10-CM | POA: Insufficient documentation

## 2022-11-26 DIAGNOSIS — Z91040 Latex allergy status: Secondary | ICD-10-CM | POA: Diagnosis not present

## 2022-11-26 DIAGNOSIS — L03032 Cellulitis of left toe: Secondary | ICD-10-CM | POA: Diagnosis not present

## 2022-11-26 DIAGNOSIS — M79675 Pain in left toe(s): Secondary | ICD-10-CM | POA: Diagnosis present

## 2022-11-26 DIAGNOSIS — M19072 Primary osteoarthritis, left ankle and foot: Secondary | ICD-10-CM | POA: Diagnosis not present

## 2022-11-26 MED ORDER — DOXYCYCLINE HYCLATE 100 MG PO CAPS
100.0000 mg | ORAL_CAPSULE | Freq: Two times a day (BID) | ORAL | 0 refills | Status: AC
  Filled 2022-11-26: qty 14, 7d supply, fill #0

## 2022-11-26 MED ORDER — ACETAMINOPHEN 325 MG PO TABS
650.0000 mg | ORAL_TABLET | Freq: Once | ORAL | Status: AC
  Administered 2022-11-26: 650 mg via ORAL
  Filled 2022-11-26: qty 2

## 2022-11-26 MED ORDER — IBUPROFEN 200 MG PO TABS
400.0000 mg | ORAL_TABLET | Freq: Once | ORAL | Status: AC
  Administered 2022-11-26: 400 mg via ORAL
  Filled 2022-11-26: qty 2

## 2022-11-26 MED ORDER — DOXYCYCLINE HYCLATE 100 MG PO TABS
100.0000 mg | ORAL_TABLET | Freq: Once | ORAL | Status: AC
  Administered 2022-11-26: 100 mg via ORAL
  Filled 2022-11-26: qty 1

## 2022-11-26 NOTE — ED Provider Notes (Signed)
[place] EMERGENCY DEPARTMENT AT [facility] Provider Note   CSN: [id number] Arrival date & time: 11/26/22  1716     History  Chief Complaint  Patient presents with   Possible Toe Infection    [name] is a 62 y.o. female.  Patient is a 62 year old female with a past medical history of hypertension and hyperlipidemia presenting to the emergency department with left first toe pain.  The patient states that she noticed today that her toenail was loose and looked like it was coming off and then started to notice some purulent drainage from her toenail.  She states that she has some mild pain.  She states that she has not remember injuring her toe.  Denies any fever or significant redness.  The history is provided by the patient.       Home Medications Prior to Admission medications   Medication Sig Start Date End Date Taking? Authorizing Provider  doxycycline (VIBRAMYCIN) 100 MG capsule Take 1 capsule (100 mg total) by mouth 2 (two) times daily for 7 days. 11/26/22 12/03/22 Yes Kingsley, Benetta Spar K, DO  albuterol (PROVENTIL HFA;VENTOLIN HFA) 108 (90 BASE) MCG/ACT inhaler Inhale 1-2 puffs into the lungs every 6 (six) hours as needed for wheezing or shortness of breath. 10/09/14   Croitoru, Mihai, MD  aspirin EC 81 MG tablet Take 81 mg by mouth daily.     [provider]  azelastine (ASTELIN) 0.1 % nasal spray PLACE 1 SPRAY PER NOSTRIL ONCE DAILY AT NIGHT 01/11/22     baclofen (LIORESAL) 10 MG tablet Take 0.5-1 tablets (5-10 mg total) by mouth every 8 (eight) hours as needed for muscle spasms 10/29/22   Eldred Manges, MD  celecoxib (CELEBREX) 200 MG capsule Take 1 capsule (200 mg total) by mouth 2 (two) times daily. 11/03/22   Eldred Manges, MD  clobetasol ointment (TEMOVATE) 0.05 % Apply as directed twice daily for 2 weeks 12/28/21   Romualdo Bolk, MD  diazepam (VALIUM) 5 MG tablet Take 1 tablet by mouth once daily as needed for neck pain 05/11/21      donepezil (ARICEPT) 5 MG tablet Take 1 tablet (5 mg total) by mouth at bedtime. 08/26/22   Sater, Pearletha Furl, MD  esomeprazole (NEXIUM) 40 MG capsule Take 1 capsule (40 mg total) by mouth daily. 11/06/21     esomeprazole (NEXIUM) 40 MG capsule Take 1 capsule (40 mg total) by mouth daily. 04/05/22     fluticasone (FLONASE) 50 MCG/ACT nasal spray PLACE 2 SPRAYS IN EACH NOSTRIL DAILY AS DIRECTED 12/18/19 11/16/22  Alysia Penna, MD  fluticasone (FLONASE) 50 MCG/ACT nasal spray Place 1 spray in each nostril once a day 09/16/21     furosemide (LASIX) 20 MG tablet Take 3 tablets (60 mg total) by mouth as needed. NEED OV. 03/07/19   Croitoru, Mihai, MD  gabapentin (NEURONTIN) 300 MG capsule Take 1 capsule (300 mg total) by mouth 3 (three) times daily. 02/18/22   Juanda Chance, NP  levothyroxine (SYNTHROID) 75 MCG tablet Take 1 tablet (75 mcg total) by mouth every morning on an empty stomach. 04/05/22     linaclotide (LINZESS) 72 MCG capsule Take 72 mcg by mouth daily as needed.    [provider]  loratadine (CLARITIN) 10 MG tablet Take 10 mg by mouth daily.    [provider]  metoprolol tartrate (LOPRESSOR) 25 MG tablet Take 1 tablet (25 mg total) by mouth 2 (two) times daily. 09/08/22   Croitoru, Skelp,  MD  nystatin cream (MYCOSTATIN) Apply to affected area 2 times a day for up to 7 days. 08/11/21   Romualdo Bolk, MD  olmesartan (BENICAR) 20 MG tablet Take 1 tablet (20 mg total) by mouth daily. 04/05/22     Omega-3 Fatty Acids (FISH OIL) 1000 MG CAPS Take 1,000 mg by mouth daily.    [provider]  OVER THE COUNTER MEDICATION Gets from [place] Name: Silijit (for memory)    [provider]  potassium chloride SA (K-DUR) 20 MEQ tablet TAKE 1 TABLET (20 MEQ TOTAL) BY MOUTH DAILY. 08/01/18 10/30/18  Croitoru, Mihai, MD  TURMERIC CURCUMIN PO Take by mouth daily.    [provider]  vitamin C (ASCORBIC ACID) 500 MG tablet Take 500 mg by mouth daily.    [provider]  VITAMIN D PO Take 1 tablet by mouth daily at 6 (six) AM.    [provider]      Allergies    Metronidazole, Crestor [rosuvastatin], Tramadol hcl, Adhesive [tape], Latex, Penicillins, and Vesicare [solifenacin]    Review of Systems   Review of Systems  Physical Exam Updated Vital Signs BP (!) 181/87   Pulse 65   Temp 97.8 F (36.6 C) (Oral)   Resp 19   Ht 5\' 3"  (1.6 m)   Wt 102.1 kg   SpO2 100%   BMI 39.86 kg/m  Physical Exam Vitals and nursing note reviewed.  Constitutional:      General: She is not in acute distress.    Appearance: Normal appearance.  HENT:     Head: Normocephalic.     Nose: Nose normal.     Mouth/Throat:     Mouth: Mucous membranes are moist.  Eyes:     Extraocular Movements: Extraocular movements intact.  Cardiovascular:     Rate and Rhythm: Normal rate.     Pulses: Normal pulses.  Pulmonary:     Effort: Pulmonary effort is normal.  Musculoskeletal:        General: Normal range of motion.     Cervical back: Normal range of motion.     Comments: Mild tenderness to left first toe  Skin:    General: Skin is warm and dry.     Capillary Refill: Capillary refill takes less than 2 seconds.     Comments: Toenail mildly loose with some crusting and purulence below the nail, mild erythema, no significant warmth  Neurological:     General: No focal deficit present.     Mental Status: She is alert and oriented to person, place, and time.  Psychiatric:        Mood and Affect: Mood normal.        Behavior: Behavior normal.     ED Results / Procedures / Treatments   Labs (all labs ordered are listed, but only abnormal results are displayed) Labs Reviewed - No data to display  EKG None  Radiology DG Toe Great Left  Result Date: 11/26/2022 CLINICAL DATA:  Possible infection.  Oozing wound. EXAM: LEFT GREAT TOE COMPARISON:  01/10/2018 FINDINGS: Postoperative changes with osteotomy and screw fixation of the first and second  metatarsal heads. Degenerative changes in the first metatarsal-phalangeal joint. No evidence of acute fracture or dislocation. No focal bone lesion or bone destruction. No erosions at the articular surface. No bone erosion or sclerosis to suggest osteomyelitis. Soft tissues are unremarkable. No radiopaque soft tissue foreign bodies or soft tissue gas. IMPRESSION: Postoperative and degenerative changes in the left first  toe. No acute bony abnormalities. Soft tissues are unremarkable. Electronically Signed   By: Burman Nieves M.D.   On: 11/26/2022 19:45    Procedures Procedures    Medications Ordered in ED Medications  doxycycline (VIBRA-TABS) tablet 100 mg (100 mg Oral Given 11/26/22 2234)  acetaminophen (TYLENOL) tablet 650 mg (650 mg Oral Given 11/26/22 2234)  ibuprofen (ADVIL) tablet 400 mg (400 mg Oral Given 11/26/22 2234)    ED Course/ Medical Decision Making/ A&P                                 Medical Decision Making This patient presents to the ED with chief complaint(s) of toe pain/drainage with pertinent past medical history of hypertension hyperlipidemia which further complicates the presenting complaint. The complaint involves an extensive differential diagnosis and also carries with it a high risk of complications and morbidity.    The differential diagnosis includes cellulitis, no palpable fluctuance making abscess unlikely, toenail avulsion, toe fracture, osteomyelitis, no signs of sepsis.  Additional history obtained: Additional history obtained from N/A Records reviewed N/A  ED Course and Reassessment: On patient's arrival she is hemodynamically stable in no acute distress.  Was initially evaluated by triage and had x-ray of her toe performed.  X-ray showed no acute abnormality.  Patient does have some mild erythema and crusting with some purulence concerning for cellulitis.  She will be started on antibiotics.  She states that she does have a podiatrist and was recommended  close follow-up.  Independent labs interpretation:  N/A  Independent visualization of imaging: - I independently visualized the following imaging with scope of interpretation limited to determining acute life threatening conditions related to emergency care: L toe XR, which revealed no acute disease  Consultation: - Consulted or discussed management/test interpretation w/ external professional: N/A  Consideration for admission or further workup: Patient has no emergent conditions requiring admission or further work-up at this time and is stable for discharge home with podiatry follow-up  Social Determinants of health: N/A    Amount and/or Complexity of Data Reviewed Radiology: ordered.  Risk OTC drugs. Prescription drug management.          Final Clinical Impression(s) / ED Diagnoses Final diagnoses:  Cellulitis of toe of left foot    Rx / DC Orders ED Discharge Orders          Ordered    doxycycline (VIBRAMYCIN) 100 MG capsule  2 times daily        11/26/22 2242              Rexford Maus, DO 11/26/22 2245

## 2022-11-26 NOTE — ED Notes (Signed)
Pt bp has increased nurse notified

## 2022-11-26 NOTE — ED Triage Notes (Signed)
Patient arrives ambulatory by POV c/o left greater toe oozing something today. Patient states when she goes to the nail salon they lift the nail up.

## 2022-11-26 NOTE — Discharge Instructions (Addendum)
You were seen in the emergency department for your toe pain and drainage.  Your x-ray showed no broken bones and no signs of bone infection.  You likely have a developing infection underneath your toenail and I given you antibiotics you should complete this as prescribed.  You can do warm soaks with your toe for 20 minutes at a time to help encourage drainage.  You should call your podiatrist to schedule follow-up appointment to have your wound rechecked next week.  You can take Tylenol and Motrin as needed for pain.  You should return to the emergency department for fevers despite the antibiotics, streaking redness up your leg or any other new or concerning symptoms.

## 2022-11-27 ENCOUNTER — Other Ambulatory Visit (HOSPITAL_COMMUNITY): Payer: Self-pay

## 2022-11-29 ENCOUNTER — Ambulatory Visit: Payer: Commercial Managed Care - PPO | Admitting: Orthopaedic Surgery

## 2022-12-02 ENCOUNTER — Encounter: Payer: Self-pay | Admitting: Rehabilitative and Restorative Service Providers"

## 2022-12-02 ENCOUNTER — Ambulatory Visit: Payer: Commercial Managed Care - PPO | Admitting: Rehabilitative and Restorative Service Providers"

## 2022-12-02 DIAGNOSIS — R293 Abnormal posture: Secondary | ICD-10-CM

## 2022-12-02 DIAGNOSIS — R262 Difficulty in walking, not elsewhere classified: Secondary | ICD-10-CM

## 2022-12-02 DIAGNOSIS — M6281 Muscle weakness (generalized): Secondary | ICD-10-CM | POA: Diagnosis not present

## 2022-12-02 DIAGNOSIS — M5459 Other low back pain: Secondary | ICD-10-CM | POA: Diagnosis not present

## 2022-12-02 NOTE — Therapy (Signed)
OUTPATIENT PHYSICAL THERAPY TREATMENT   Patient Name: [name] MRN: [id number] DOB:[birth year], 62 y.o., female Today's Date: 12/02/2022  END OF SESSION:  PT End of Session - 12/02/22 1551     Visit Number 2    Number of Visits 11    Date for PT Re-Evaluation 01/12/23    Progress Note Due on Visit 11    PT Start Time 1555    PT Stop Time 1634    PT Time Calculation (min) 39 min    Activity Tolerance Patient tolerated treatment well    Behavior During Therapy WFL for tasks assessed/performed              Past Medical History:  Diagnosis Date   Anemia when she was young   Arthritis    Congestion of nasal sinus    Constipation    takes Colace every other day   DDD (degenerative disc disease), lumbar    Dependent edema    Diabetes mellitus without complication (HCC)    per pt had been diagnosed but then "number started going down so my doctor said i didnt have diabetes"   Dry eyes    uses Systane Eye drops daily as needed   GERD (gastroesophageal reflux disease) 12/06/2011   takes Nexium daily   Headache(784.0)    occasionally-d/t congestion   Heart murmur    History of bronchitis 6-7 yrs ago   Hyperlipidemia    Hypertension    takes Metoprolol and Diovan daily   Hypothyroidism 12/06/2011   takes Synthroid daily   Joint pain    Multiple allergies    takes Zyrtec daily;uses Nasonex daily as needed   Nocturia    Numbness    weakness-right hand   Onychomycosis    OSA on CPAP    Pancreatitis    Peripheral edema    takes Furosemide daily   Shingles    Urinary frequency    Venous insufficiency of leg    Past Surgical History:  Procedure Laterality Date   ABDOMINAL HYSTERECTOMY     uterine prolapse   BACK SURGERY     L4-5   CARDIAC SURGERY     CATARACT EXTRACTION, BILATERAL  2023   CHOLECYSTECTOMY     COLONOSCOPY     ESOPHAGOGASTRODUODENOSCOPY  5-71yrs ago   FOOT SURGERY Bilateral    KNEE ARTHROSCOPY     LAPAROSCOPIC GASTRIC SLEEVE RESECTION N/A  08/23/2016   Procedure: LAPAROSCOPIC GASTRIC SLEEVE RESECTION WITH UPPER ENDO;  Surgeon: Luretha Murphy, MD;  Location: [facility];  Service: General;  Laterality: N/A;   POSTERIOR LUMBAR FUSION  03/10/2009   L3-4 and L3-4 fusion, Dr. Hilda Lias   SHOULDER ARTHROSCOPY WITH ROTATOR CUFF REPAIR AND SUBACROMIAL DECOMPRESSION Right 08/23/2013   Procedure: RIGHT SHOULDER ARTHROSCOPY WITH  SUBACROMIAL DECOMPRESSION DISTAL CLAVICLE RESECTION AND  ROTATOR CUFF REPAIR ;  Surgeon: Senaida Lange, MD;  Location: [facility];  Service: Orthopedics;  Laterality: Right;   TUBAL LIGATION     Patient Active Problem List   Diagnosis Date Noted   Memory change 12/25/2020   Chronic pain syndrome 05/04/2019   Raynaud's disease 04/13/2019   Lumbar radiculopathy 12/18/2018   Spinal stenosis of lumbar region with neurogenic claudication 12/18/2018   Post laminectomy syndrome 12/18/2018   Bilateral primary osteoarthritis of knee 12/27/2016   History of excision of intestinal structure 09/07/2016   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 08/23/2016   Prediabetes 12/03/2015   OSA on CPAP 02/19/2015   Lung field abnormal 11/11/2014  Morbid obesity (HCC) 10/09/2014   Constipation 06/14/2014   Peripheral venous insufficiency 05/25/2013   Pulmonary HTN (HCC) 05/12/2013   Bilateral lower extremity edema 05/12/2013   Morbid obesity with BMI of 50.0-59.9, adult (HCC)    Shoulder joint pain 03/28/2013   Abnormal glucose level 02/20/2013   Alopecia 02/20/2013   Degeneration of lumbar intervertebral disc 02/20/2013   Hyperglycemia due to type 2 diabetes mellitus (HCC) 02/20/2013   Type 2 diabetes mellitus with other specified complication (HCC) 02/20/2013   Back pain, hx of prior back surgery 12/09/2011   Pancreatitis, mild, CT negative 12/08/2011   Fatty liver 12/08/2011   Substernal chest pain, suspected secondary to acute pancreatitis 12/06/2011   Hypertension 12/06/2011   Hypokalemia 12/06/2011    Hypothyroidism 12/06/2011   Anxiety 12/06/2011   BV (bacterial vaginosis)    Hyperlipidemia     PCP: Alysia Penna, MD  REFERRING PROVIDER: Eldred Manges, MD  REFERRING DIAG:  Diagnosis  M54.16 (ICD-10-CM) - Lumbar radiculopathy    Rationale for Evaluation and Treatment: Rehabilitation  THERAPY DIAG:  Abnormal posture  Other low back pain  Muscle weakness (generalized)  Difficulty in walking, not elsewhere classified  ONSET DATE: About a month ago  SUBJECTIVE:                                                                                                                                                                                           SUBJECTIVE STATEMENT: Pt indicated having foot complaints with infection that led to ER visit.  Pt indicated no pain at all today upon arrival.   PERTINENT HISTORY:  OA, lumbar DDD, fusion L3-5, Type 2 DM, HLD, HTN, hypothyroid, abdominal hysterectomy, previous knee and foot surgeries, Rt RTC repair, morbid obesity  PAIN:  NPRS scale: 0/10, at worst since last visit 6/10  Pain location: back Pain description: sharp Aggravating factors: one time with walking Relieving factors: rest   PRECAUTIONS: Back  RED FLAGS: None   WEIGHT BEARING RESTRICTIONS: No  FALLS:  Has patient fallen in last 6 months? No  LIVING ENVIRONMENT: Lives with: lives alone Lives in: House/apartment Stairs:  No issues Has following equipment at home: Single point cane  OCCUPATION: Works in medical records at [employer]  PLOF: Independent  PATIENT GOALS: Get back to normal activities without being limited by her back or knees  NEXT MD VISIT: Follow-up in 3 months  OBJECTIVE:  Note: Objective measures were completed at Evaluation unless otherwise noted.  DIAGNOSTIC FINDINGS:  11/17/2022 AP lateral lumbar images are obtained and reviewed this shows instrumented  fusion L3-4 L4-5.  There is disc space narrowing and some sclerosis  on the   posterior aspect of the endplate at L2-3.  Notes of loosening and single  cage appears stable.   Impression: Previous lumbar fusions instrumented L3-4, L4-5.  Adjacent  level progression L2-3.  No listhesis.  PATIENT SURVEYS:  12/02/2022: FOTO update 54  11/17/2022 FOTO 50 (risk-adjusted 42, Goal 64 in 11 vists)  SCREENING FOR RED FLAGS: 11/17/2022 Bowel or bladder incontinence: No Spinal tumors: No Cauda equina syndrome: No Compression fracture: No Abdominal aneurysm: No  COGNITION: 11/17/2022 Overall cognitive status: Within functional limits for tasks assessed     SENSATION: 11/17/2022 Not with this episode, previously on the right side  MUSCLE LENGTH: 11/17/2022 Hamstrings: Right 45 deg; Left 45 deg  POSTURE:  11/17/2022 rounded shoulders, forward head, decreased lumbar lordosis, and flexed trunk   LUMBAR ROM:   AROM 11/17/2022  Flexion   Extension 5  Right lateral flexion   Left lateral flexion   Right rotation   Left rotation    (Blank rows = not tested)  LOWER EXTREMITY ROM:     Passive  Left/Right 11/17/2022   Hip flexion 85/90   Hip extension    Hip abduction    Hip adduction    Hip internal rotation 5/15   Hip external rotation 35/35   Knee flexion    Knee extension    Ankle dorsiflexion    Ankle plantarflexion    Ankle inversion    Ankle eversion     (Blank rows = not tested)  LOWER EXTREMITY STRENGTH:  Deferred at evaluation  MMT Left/Right 11/17/2022   Hip flexion    Hip extension    Hip abduction    Hip adduction    Hip internal rotation    Hip external rotation    Knee flexion    Knee extension    Ankle dorsiflexion    Ankle plantarflexion    Ankle inversion    Ankle eversion     (Blank rows = not tested)  GAIT: 11/17/2022 Distance walked: 50 feet Assistive device utilized: None Level of assistance: Complete Independence Comments: Kaula walks in a forward flexed posture.  She notes she is more comfortable using a  shopping cart at the store and she does not typically walk more than about 10 minutes due to low back fatigue and knee pain.                  TODAY'S TREATMENT:                                                                               DATE: 12/02/2022 Therex Nustep lvl 6 12 mins UE/LE aerobic exercise Seated UE lift into table for multifidi isometric hold 5 sec hold x 10  Seated UE table press down ab contraction 5 sec hold x 10 Seated green tband rows 2 x 15 c scapular retraction focus Standing green band GH ext 2 x 15   Additional time spent in education and review of benefits from aerobic exercise and gym based usage in future pending symptom response and availability based off membership.  Question and answer time given to improve knowledge.  Verbal review of existing HEP for technique follow up.  TODAY'S TREATMENT:                                                                               DATE: 11/17/2022 Gentle Lumbar extension AROM 10 x 3 seconds Shoulder blade pinches 10 x 5 seconds  Heel to toe raises with transversus abdominus activation 10 x 3 seconds Yoga bridge 10 x 3 seconds  Functional Activities: Reviewed spine anatomy with spine model, practical lifting techniques like a modified diagonal squat lift and golfers lift, also reviewed bed mobility with logroll and modified golfers lift, discussed posture and the importance of changing positions frequently, discussed home walking and exercise program.   PATIENT EDUCATION:  11/17/2022 Education details: See above Person educated: Patient Education method: Explanation, Demonstration, Tactile cues, Verbal cues, and Handouts Education comprehension: verbalized understanding, returned demonstration, verbal cues required, tactile cues required, and needs further education  HOME EXERCISE PROGRAM: Access Code: [email or web address] URL: [email or web address] Date: 11/17/2022 Prepared by: Pauletta Browns  Exercises - Standing Lumbar Extension at Wall - Forearms  - 3-5 x daily - 7 x weekly - 1 sets - 5 reps - 3 seconds hold - Yoga Bridge  - 2-3 x daily - 7 x weekly - 1 sets - 10 reps - 3 seconds hold - Heel Toe Raises with Counter Support  - 3-5 x daily - 7 x weekly - 1 sets - 10 reps - 3 seconds hold - Standing Scapular Retraction  - 5 x daily - 7 x weekly - 1 sets - 5 reps - 5 second hold  ASSESSMENT:  CLINICAL IMPRESSION: FOTO reassessment improved mildly today.  Activity level has been negatively impacted by presence of toe infection.  Plan to continue to promote improved lumbar mobility, general activity improvements with aerobic exercise inclusion as toe/foot allows.    OBJECTIVE IMPAIRMENTS: decreased activity tolerance, decreased endurance, decreased knowledge of condition, difficulty walking, decreased ROM, decreased strength, decreased safety awareness, impaired perceived functional ability, impaired flexibility, improper body mechanics, postural dysfunction, obesity, and pain.   ACTIVITY LIMITATIONS: carrying, lifting, bending, sitting, standing, squatting, sleeping, bed mobility, and locomotion level  PARTICIPATION LIMITATIONS: shopping, community activity, and occupation  PERSONAL FACTORS: OA, lumbar DDD, fusion L3-5, Type 2 DM, HLD, HTN, hypothyroid, abdominal hysterectomy, previous knee and foot surgeries, Rt RTC repair, morbid obesity are also affecting patient's functional outcome.   REHAB POTENTIAL: Good  CLINICAL DECISION MAKING: Stable/uncomplicated  EVALUATION COMPLEXITY: Low   GOALS: Goals reviewed with patient? Yes  SHORT TERM GOALS: Target date: 12/15/2022  Shandricka will be independent with her day 1 home exercise program Baseline: Started 11/17/2022 Goal status: on going 12/02/2022  2.  Jazlynn will be able to stand in a more upright posture due to decreased hip flexors tightness and improved lumbar extensor strength Baseline: Stands in a forward  flexed posture at evaluation 11/17/2022 Goal status: on going 12/02/2022   LONG TERM GOALS: Target date: 01/12/2023  Improve FOTO to 64 in 11 visits (50, risk adjusted 42 at evaluation) Baseline: 50, risk adjusted 42 Goal status: INITIAL  2.  Joline will report low back pain consistently 0-3/10 on the numeric pain rating scale Baseline: 3-5/10 Goal status: INITIAL  3.  Improve bilateral hip flexors  flexibility to at least 90 degrees Baseline: 80/90 degrees Goal status: INITIAL  4.  Improve bilateral quadriceps and lumbar extensors strength as assessed by improved standing and walking endurance, FOTO scores and objective measures Baseline: Stands in a flexed posture, limited to 10 minutes or less of walking and objective measures deferred at evaluation secondary to these findings and other functional deficits Goal status: INITIAL  5.  Chantel will be independent with her long-term maintenance home exercise program at discharge Baseline: Started 11/17/2022 Goal status: INITIAL  PLAN:  PT FREQUENCY: 1-2x/week  PT DURATION: 8 weeks  PLANNED INTERVENTIONS: 97110-Therapeutic exercises, 97530- Therapeutic activity, 97112- Neuromuscular re-education, 97535- Self Care, 16109- Manual therapy, Patient/Family education, Dry Needling, Cryotherapy, and Moist heat.  PLAN FOR NEXT SESSION: Recheck activity tolerance reporting.  Adjust based off toes.  HEP transitioning when Pt comfortable with plan.    Chyrel Masson, PT, DPT, OCS, ATC 12/02/22  4:34 PM

## 2022-12-03 ENCOUNTER — Encounter: Payer: Self-pay | Admitting: Podiatry

## 2022-12-03 ENCOUNTER — Ambulatory Visit: Payer: Commercial Managed Care - PPO | Admitting: Podiatry

## 2022-12-03 DIAGNOSIS — L603 Nail dystrophy: Secondary | ICD-10-CM | POA: Diagnosis not present

## 2022-12-03 NOTE — Progress Notes (Signed)
Subjective:  Patient ID: [name], female    DOB: [birth year],  MRN: [id number]  Chief Complaint  Patient presents with   Toe Pain    Left toe infected on antibiotics needs nail removed it is lifting.    62 y.o. female presents with the above complaint.  Patient presents with left hallux nail dystrophy.  She states been present for a little bit of time.  She went to urgent care and got antibiotics.  Has been causing her some pain.  Distal part of her nail is getting lifted up.   Review of Systems: Negative except as noted in the HPI. Denies N/V/F/Ch.  Past Medical History:  Diagnosis Date   Anemia when she was young   Arthritis    Congestion of nasal sinus    Constipation    takes Colace every other day   DDD (degenerative disc disease), lumbar    Dependent edema    Diabetes mellitus without complication (HCC)    per pt had been diagnosed but then "number started going down so my doctor said i didnt have diabetes"   Dry eyes    uses Systane Eye drops daily as needed   GERD (gastroesophageal reflux disease) 12/06/2011   takes Nexium daily   Headache(784.0)    occasionally-d/t congestion   Heart murmur    History of bronchitis 6-7 yrs ago   Hyperlipidemia    Hypertension    takes Metoprolol and Diovan daily   Hypothyroidism 12/06/2011   takes Synthroid daily   Joint pain    Multiple allergies    takes Zyrtec daily;uses Nasonex daily as needed   Nocturia    Numbness    weakness-right hand   Onychomycosis    OSA on CPAP    Pancreatitis    Peripheral edema    takes Furosemide daily   Shingles    Urinary frequency    Venous insufficiency of leg     Current Outpatient Medications:    albuterol (PROVENTIL HFA;VENTOLIN HFA) 108 (90 BASE) MCG/ACT inhaler, Inhale 1-2 puffs into the lungs every 6 (six) hours as needed for wheezing or shortness of breath., Disp: 1 Inhaler, Rfl: 2   aspirin EC 81 MG tablet, Take 81 mg by mouth daily. , Disp: , Rfl:    azelastine  (ASTELIN) 0.1 % nasal spray, PLACE 1 SPRAY PER NOSTRIL ONCE DAILY AT NIGHT, Disp: 30 mL, Rfl: 4   baclofen (LIORESAL) 10 MG tablet, Take 0.5-1 tablets (5-10 mg total) by mouth every 8 (eight) hours as needed for muscle spasms, Disp: 60 tablet, Rfl: 1   celecoxib (CELEBREX) 200 MG capsule, Take 1 capsule (200 mg total) by mouth 2 (two) times daily., Disp: 60 capsule, Rfl: 3   clobetasol ointment (TEMOVATE) 0.05 %, Apply as directed twice daily for 2 weeks, Disp: 30 g, Rfl: 0   diazepam (VALIUM) 5 MG tablet, Take 1 tablet by mouth once daily as needed for neck pain, Disp: 30 tablet, Rfl: 3   donepezil (ARICEPT) 5 MG tablet, Take 1 tablet (5 mg total) by mouth at bedtime., Disp: 30 tablet, Rfl: 5   doxycycline (VIBRAMYCIN) 100 MG capsule, Take 1 capsule (100 mg total) by mouth 2 (two) times daily for 7 days., Disp: 14 capsule, Rfl: 0   esomeprazole (NEXIUM) 40 MG capsule, Take 1 capsule (40 mg total) by mouth daily., Disp: 90 capsule, Rfl: 3   esomeprazole (NEXIUM) 40 MG capsule, Take 1 capsule (40 mg total) by mouth daily., Disp: 90 capsule, Rfl: 3  fluticasone (FLONASE) 50 MCG/ACT nasal spray, Place 1 spray in each nostril once a day, Disp: 48 g, Rfl: 4   furosemide (LASIX) 20 MG tablet, Take 3 tablets (60 mg total) by mouth as needed. NEED OV., Disp: 90 tablet, Rfl: 3   gabapentin (NEURONTIN) 300 MG capsule, Take 1 capsule (300 mg total) by mouth 3 (three) times daily., Disp: 270 capsule, Rfl: 1   levothyroxine (SYNTHROID) 75 MCG tablet, Take 1 tablet (75 mcg total) by mouth every morning on an empty stomach., Disp: 90 tablet, Rfl: 2   linaclotide (LINZESS) 72 MCG capsule, Take 72 mcg by mouth daily as needed., Disp: , Rfl:    loratadine (CLARITIN) 10 MG tablet, Take 10 mg by mouth daily., Disp: , Rfl:    metoprolol tartrate (LOPRESSOR) 25 MG tablet, Take 1 tablet (25 mg total) by mouth 2 (two) times daily., Disp: 180 tablet, Rfl: 1   nystatin cream (MYCOSTATIN), Apply to affected area 2 times a day  for up to 7 days., Disp: 30 g, Rfl: 1   olmesartan (BENICAR) 20 MG tablet, Take 1 tablet (20 mg total) by mouth daily., Disp: 90 tablet, Rfl: 3   Omega-3 Fatty Acids (FISH OIL) 1000 MG CAPS, Take 1,000 mg by mouth daily., Disp: , Rfl:    OVER THE COUNTER MEDICATION, Gets from [place] Name: Silijit (for memory), Disp: , Rfl:    TURMERIC CURCUMIN PO, Take by mouth daily., Disp: , Rfl:    vitamin C (ASCORBIC ACID) 500 MG tablet, Take 500 mg by mouth daily., Disp: , Rfl:    VITAMIN D PO, Take 1 tablet by mouth daily at 6 (six) AM., Disp: , Rfl:    fluticasone (FLONASE) 50 MCG/ACT nasal spray, PLACE 2 SPRAYS IN EACH NOSTRIL DAILY AS DIRECTED, Disp: 16 g, Rfl: 3   potassium chloride SA (K-DUR) 20 MEQ tablet, TAKE 1 TABLET (20 MEQ TOTAL) BY MOUTH DAILY., Disp: 90 tablet, Rfl: 1  Social History   Tobacco Use  Smoking Status Former   Current packs/day: 0.00   Types: Cigarettes   Quit date: 1993   Years since quitting: 31.8  Smokeless Tobacco Never  Tobacco Comments   quit smoking in 1993    Allergies  Allergen Reactions   Metronidazole     Developed pancreatitis after taking this   Crestor [Rosuvastatin]    Tramadol Hcl     Ultram- eye edema and arm rash    Adhesive [Tape] Itching and Rash   Latex Itching and Rash   Penicillins Rash    Has patient had a PCN reaction causing immediate rash, facial/tongue/throat swelling, SOB or lightheadedness with hypotension: No Has patient had a PCN reaction causing severe rash involving mucus membranes or skin necrosis: No Has patient had a PCN reaction that required hospitalization No Has patient had a PCN reaction occurring within the last 10 years: No If all of the above answers are "NO", then may proceed with Cephalosporin use.   Vesicare [Solifenacin] Rash   Objective:  There were no vitals filed for this visit. There is no height or weight on file to calculate BMI. Constitutional Well developed. Well nourished.  Vascular Dorsalis pedis  pulses palpable bilaterally. Posterior tibial pulses palpable bilaterally. Capillary refill normal to all digits.  No cyanosis or clubbing noted. Pedal hair growth normal.  Neurologic Normal speech. Oriented to person, place, and time. Epicritic sensation to light touch grossly present bilaterally.  Dermatologic Nails thickened elongated dystrophic mycotic nail left hallux no infection noted no  purulent drainage noted no redness noted Skin within normal limits  Orthopedic: Normal joint ROM without pain or crepitus bilaterally. No visible deformities. No bony tenderness.   Radiographs: None Assessment:   1. Nail dystrophy    Plan:  Patient was evaluated and treated and all questions answered.  Left hallux nail dystrophy -All questions and concerns were discussed with the patient in extensive detail I believe that those fungal infection is lifting but distal part of the nail up.  At this time using a nail nipper the distal nail was clipped to the well adhered nailbed.  No signs of infection noted no purulent drainage noted.  Encouraged her to do Betadine wet-to-dry dressing for next few weeks.  No follow-ups on file.

## 2022-12-06 ENCOUNTER — Encounter: Payer: Commercial Managed Care - PPO | Admitting: Physical Therapy

## 2022-12-06 ENCOUNTER — Telehealth: Payer: Self-pay | Admitting: Physical Therapy

## 2022-12-06 NOTE — Telephone Encounter (Signed)
Pt did not show for PT appointment today. They were contacted and informed of this via voicemail. They were provided the date and time of their next appointment on voicemail. They were instructed to call us to let us know if they cannot make their appointment.   Ivery Quale, PT, DPT 12/06/22 4:25 PM

## 2022-12-08 ENCOUNTER — Telehealth: Payer: Self-pay | Admitting: Rehabilitative and Restorative Service Providers"

## 2022-12-08 ENCOUNTER — Encounter: Payer: Commercial Managed Care - PPO | Admitting: Rehabilitative and Restorative Service Providers"

## 2022-12-08 NOTE — Telephone Encounter (Signed)
Called and left a VM checking on her after no show #2 (in a row).  Left [phone] number to cancel or reschedule if needed.  DC if NS #3.

## 2022-12-13 ENCOUNTER — Ambulatory Visit: Payer: Commercial Managed Care - PPO | Admitting: Physical Therapy

## 2022-12-13 ENCOUNTER — Encounter: Payer: Self-pay | Admitting: Physical Therapy

## 2022-12-13 DIAGNOSIS — M6281 Muscle weakness (generalized): Secondary | ICD-10-CM | POA: Diagnosis not present

## 2022-12-13 DIAGNOSIS — R262 Difficulty in walking, not elsewhere classified: Secondary | ICD-10-CM

## 2022-12-13 DIAGNOSIS — R293 Abnormal posture: Secondary | ICD-10-CM | POA: Diagnosis not present

## 2022-12-13 DIAGNOSIS — M5459 Other low back pain: Secondary | ICD-10-CM

## 2022-12-13 NOTE — Therapy (Signed)
OUTPATIENT PHYSICAL THERAPY TREATMENT   Patient Name: [name] MRN: [id number] DOB:[birth year], 62 y.o., female Today's Date: 12/13/2022  END OF SESSION:  PT End of Session - 12/13/22 1625     Visit Number 3    Number of Visits 11    Date for PT Re-Evaluation 01/12/23    Progress Note Due on Visit 11    PT Start Time 1600    PT Stop Time 1638    PT Time Calculation (min) 38 min    Activity Tolerance Patient tolerated treatment well    Behavior During Therapy WFL for tasks assessed/performed               Past Medical History:  Diagnosis Date   Anemia when she was young   Arthritis    Congestion of nasal sinus    Constipation    takes Colace every other day   DDD (degenerative disc disease), lumbar    Dependent edema    Diabetes mellitus without complication (HCC)    per pt had been diagnosed but then "number started going down so my doctor said i didnt have diabetes"   Dry eyes    uses Systane Eye drops daily as needed   GERD (gastroesophageal reflux disease) 12/06/2011   takes Nexium daily   Headache(784.0)    occasionally-d/t congestion   Heart murmur    History of bronchitis 6-7 yrs ago   Hyperlipidemia    Hypertension    takes Metoprolol and Diovan daily   Hypothyroidism 12/06/2011   takes Synthroid daily   Joint pain    Multiple allergies    takes Zyrtec daily;uses Nasonex daily as needed   Nocturia    Numbness    weakness-right hand   Onychomycosis    OSA on CPAP    Pancreatitis    Peripheral edema    takes Furosemide daily   Shingles    Urinary frequency    Venous insufficiency of leg    Past Surgical History:  Procedure Laterality Date   ABDOMINAL HYSTERECTOMY     uterine prolapse   BACK SURGERY     L4-5   CARDIAC SURGERY     CATARACT EXTRACTION, BILATERAL  2023   CHOLECYSTECTOMY     COLONOSCOPY     ESOPHAGOGASTRODUODENOSCOPY  5-46yrs ago   FOOT SURGERY Bilateral    KNEE ARTHROSCOPY     LAPAROSCOPIC GASTRIC SLEEVE RESECTION  N/A 08/23/2016   Procedure: LAPAROSCOPIC GASTRIC SLEEVE RESECTION WITH UPPER ENDO;  Surgeon: Luretha Murphy, MD;  Location: [facility];  Service: General;  Laterality: N/A;   POSTERIOR LUMBAR FUSION  03/10/2009   L3-4 and L3-4 fusion, Dr. Hilda Lias   SHOULDER ARTHROSCOPY WITH ROTATOR CUFF REPAIR AND SUBACROMIAL DECOMPRESSION Right 08/23/2013   Procedure: RIGHT SHOULDER ARTHROSCOPY WITH  SUBACROMIAL DECOMPRESSION DISTAL CLAVICLE RESECTION AND  ROTATOR CUFF REPAIR ;  Surgeon: Senaida Lange, MD;  Location: [facility];  Service: Orthopedics;  Laterality: Right;   TUBAL LIGATION     Patient Active Problem List   Diagnosis Date Noted   Memory change 12/25/2020   Chronic pain syndrome 05/04/2019   Raynaud's disease 04/13/2019   Lumbar radiculopathy 12/18/2018   Spinal stenosis of lumbar region with neurogenic claudication 12/18/2018   Post laminectomy syndrome 12/18/2018   Bilateral primary osteoarthritis of knee 12/27/2016   History of excision of intestinal structure 09/07/2016   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 08/23/2016   Prediabetes 12/03/2015   OSA on CPAP 02/19/2015   Lung field abnormal  11/11/2014   Morbid obesity (HCC) 10/09/2014   Constipation 06/14/2014   Peripheral venous insufficiency 05/25/2013   Pulmonary HTN (HCC) 05/12/2013   Bilateral lower extremity edema 05/12/2013   Morbid obesity with BMI of 50.0-59.9, adult (HCC)    Shoulder joint pain 03/28/2013   Abnormal glucose level 02/20/2013   Alopecia 02/20/2013   Degeneration of lumbar intervertebral disc 02/20/2013   Hyperglycemia due to type 2 diabetes mellitus (HCC) 02/20/2013   Type 2 diabetes mellitus with other specified complication (HCC) 02/20/2013   Back pain, hx of prior back surgery 12/09/2011   Pancreatitis, mild, CT negative 12/08/2011   Fatty liver 12/08/2011   Substernal chest pain, suspected secondary to acute pancreatitis 12/06/2011   Hypertension 12/06/2011   Hypokalemia 12/06/2011    Hypothyroidism 12/06/2011   Anxiety 12/06/2011   BV (bacterial vaginosis)    Hyperlipidemia     PCP: Alysia Penna, MD  REFERRING PROVIDER: Eldred Manges, MD  REFERRING DIAG:  Diagnosis  M54.16 (ICD-10-CM) - Lumbar radiculopathy    Rationale for Evaluation and Treatment: Rehabilitation  THERAPY DIAG:  Abnormal posture  Other low back pain  Muscle weakness (generalized)  Difficulty in walking, not elsewhere classified  ONSET DATE: About a month ago  SUBJECTIVE:                                                                                                                                                                                           SUBJECTIVE STATEMENT: Pt states back pain around 5/10. She apologizes for missing last 2 appointments as she was out of town and forgot to let us know.  PERTINENT HISTORY:  OA, lumbar DDD, fusion L3-5, Type 2 DM, HLD, HTN, hypothyroid, abdominal hysterectomy, previous knee and foot surgeries, Rt RTC repair, morbid obesity  PAIN:  NPRS scale: 5/10 Pain location: back Pain description: sharp Aggravating factors: one time with walking Relieving factors: rest   PRECAUTIONS: Back  RED FLAGS: None   WEIGHT BEARING RESTRICTIONS: No  FALLS:  Has patient fallen in last 6 months? No  LIVING ENVIRONMENT: Lives with: lives alone Lives in: House/apartment Stairs:  No issues Has following equipment at home: Single point cane  OCCUPATION: Works in medical records at [employer]  PLOF: Independent  PATIENT GOALS: Get back to normal activities without being limited by her back or knees  NEXT MD VISIT: Follow-up in 3 months  OBJECTIVE:  Note: Objective measures were completed at Evaluation unless otherwise noted.  DIAGNOSTIC FINDINGS:  11/17/2022 AP lateral lumbar images are obtained and reviewed this shows instrumented  fusion L3-4 L4-5.  There is disc space narrowing and some sclerosis on the  posterior aspect of the endplate  at L2-3.  Notes of loosening and single  cage appears stable.   Impression: Previous lumbar fusions instrumented L3-4, L4-5.  Adjacent  level progression L2-3.  No listhesis.  PATIENT SURVEYS:  12/02/2022: FOTO update 54  11/17/2022 FOTO 50 (risk-adjusted 42, Goal 64 in 11 vists)  SCREENING FOR RED FLAGS: 11/17/2022 Bowel or bladder incontinence: No Spinal tumors: No Cauda equina syndrome: No Compression fracture: No Abdominal aneurysm: No  COGNITION: 11/17/2022 Overall cognitive status: Within functional limits for tasks assessed     SENSATION: 11/17/2022 Not with this episode, previously on the right side  MUSCLE LENGTH: 11/17/2022 Hamstrings: Right 45 deg; Left 45 deg  POSTURE:  11/17/2022 rounded shoulders, forward head, decreased lumbar lordosis, and flexed trunk   LUMBAR ROM:   AROM 11/17/2022  Flexion   Extension 5  Right lateral flexion   Left lateral flexion   Right rotation   Left rotation    (Blank rows = not tested)  LOWER EXTREMITY ROM:     Passive  Left/Right 11/17/2022   Hip flexion 85/90   Hip extension    Hip abduction    Hip adduction    Hip internal rotation 5/15   Hip external rotation 35/35   Knee flexion    Knee extension    Ankle dorsiflexion    Ankle plantarflexion    Ankle inversion    Ankle eversion     (Blank rows = not tested)  LOWER EXTREMITY STRENGTH:  Deferred at evaluation  MMT Left/Right 11/17/2022   Hip flexion    Hip extension    Hip abduction    Hip adduction    Hip internal rotation    Hip external rotation    Knee flexion    Knee extension    Ankle dorsiflexion    Ankle plantarflexion    Ankle inversion    Ankle eversion     (Blank rows = not tested)  GAIT: 11/17/2022 Distance walked: 50 feet Assistive device utilized: None Level of assistance: Complete Independence Comments: [name] walks in a forward flexed posture.  She notes she is more comfortable using a shopping cart at the store and she  does not typically walk more than about 10 minutes due to low back fatigue and knee pain.                  TODAY'S TREATMENT:                                                                                DATE: 12/13/2022 Therex Nustep lvl 6 12 mins UE/LE aerobic exercise Standing green tband rows 2 x 15 c scapular retraction focus Standing  green band GH ext 2 x 15  Seated pball roll outs 5 sec X10 Seated ab set with pball isometric shoulder ext 5 sec X 10 and isometric hip flexion 5 sec X 5 Standing lumbar extensions 3 sec hold X10 Standing hip abduction X 10 bilat Standing hip extension X 10 bilat   DATE: 12/02/2022 Therex Nustep lvl 6 12 mins UE/LE aerobic exercise Seated UE lift into table for multifidi isometric hold 5 sec hold x 10  Seated UE table press  down ab contraction 5 sec hold x 10 Seated green tband rows 2 x 15 c scapular retraction focus Standing green band GH ext 2 x 15   Additional time spent in education and review of benefits from aerobic exercise and gym based usage in future pending symptom response and availability based off membership.  Question and answer time given to improve knowledge.  Verbal review of existing HEP for technique follow up.        TODAY'S TREATMENT:                                                                               DATE: 11/17/2022 Gentle Lumbar extension AROM 10 x 3 seconds Shoulder blade pinches 10 x 5 seconds  Heel to toe raises with transversus abdominus activation 10 x 3 seconds Yoga bridge 10 x 3 seconds  Functional Activities: Reviewed spine anatomy with spine model, practical lifting techniques like a modified diagonal squat lift and golfers lift, also reviewed bed mobility with logroll and modified golfers lift, discussed posture and the importance of changing positions frequently, discussed home walking and exercise program.   PATIENT EDUCATION:  11/17/2022 Education details: See above Person educated:  Patient Education method: Explanation, Demonstration, Tactile cues, Verbal cues, and Handouts Education comprehension: verbalized understanding, returned demonstration, verbal cues required, tactile cues required, and needs further education  HOME EXERCISE PROGRAM: Access Code: [email or web address] URL: [email or web address] Date: 11/17/2022 Prepared by: Pauletta Browns  Exercises - Standing Lumbar Extension at Wall - Forearms  - 3-5 x daily - 7 x weekly - 1 sets - 5 reps - 3 seconds hold - Yoga Bridge  - 2-3 x daily - 7 x weekly - 1 sets - 10 reps - 3 seconds hold - Heel Toe Raises with Counter Support  - 3-5 x daily - 7 x weekly - 1 sets - 10 reps - 3 seconds hold - Standing Scapular Retraction  - 5 x daily - 7 x weekly - 1 sets - 5 reps - 5 second hold  ASSESSMENT:  CLINICAL IMPRESSION: She had fair to good overall tolerance with exercise progressions focusing on improving lumbar/core/hip strength and ROM to improve overall function.   OBJECTIVE IMPAIRMENTS: decreased activity tolerance, decreased endurance, decreased knowledge of condition, difficulty walking, decreased ROM, decreased strength, decreased safety awareness, impaired perceived functional ability, impaired flexibility, improper body mechanics, postural dysfunction, obesity, and pain.   ACTIVITY LIMITATIONS: carrying, lifting, bending, sitting, standing, squatting, sleeping, bed mobility, and locomotion level  PARTICIPATION LIMITATIONS: shopping, community activity, and occupation  PERSONAL FACTORS: OA, lumbar DDD, fusion L3-5, Type 2 DM, HLD, HTN, hypothyroid, abdominal hysterectomy, previous knee and foot surgeries, Rt RTC repair, morbid obesity are also affecting patient's functional outcome.   REHAB POTENTIAL: Good  CLINICAL DECISION MAKING: Stable/uncomplicated  EVALUATION COMPLEXITY: Low   GOALS: Goals reviewed with patient? Yes  SHORT TERM GOALS: Target date: 12/15/2022  [name] will be independent with  her day 1 home exercise program Baseline: Started 11/17/2022 Goal status: on going 12/02/2022  2.  [name] will be able to stand in a more upright posture due to decreased hip flexors tightness and improved lumbar extensor strength Baseline: Stands in a forward flexed posture at evaluation  11/17/2022 Goal status: on going 12/02/2022   LONG TERM GOALS: Target date: 01/12/2023  Improve FOTO to 64 in 11 visits (50, risk adjusted 42 at evaluation) Baseline: 50, risk adjusted 42 Goal status: INITIAL  2.  [name] will report low back pain consistently 0-3/10 on the numeric pain rating scale Baseline: 3-5/10 Goal status: INITIAL  3.  Improve bilateral hip flexors flexibility to at least 90 degrees Baseline: 80/90 degrees Goal status: INITIAL  4.  Improve bilateral quadriceps and lumbar extensors strength as assessed by improved standing and walking endurance, FOTO scores and objective measures Baseline: Stands in a flexed posture, limited to 10 minutes or less of walking and objective measures deferred at evaluation secondary to these findings and other functional deficits Goal status: INITIAL  5.  [name] will be independent with her long-term maintenance home exercise program at discharge Baseline: Started 11/17/2022 Goal status: INITIAL  PLAN:  PT FREQUENCY: 1-2x/week  PT DURATION: 8 weeks  PLANNED INTERVENTIONS: 97110-Therapeutic exercises, 97530- Therapeutic activity, 97112- Neuromuscular re-education, 97535- Self Care, 16109- Manual therapy, Patient/Family education, Dry Needling, Cryotherapy, and Moist heat.  PLAN FOR NEXT SESSION: ROM and strength progressions as tolerated to improve function. HEP transitioning when Pt comfortable with plan.    [name], PT, DPT 12/13/22 4:34 PM

## 2022-12-15 ENCOUNTER — Ambulatory Visit: Payer: Commercial Managed Care - PPO | Admitting: Rehabilitative and Restorative Service Providers"

## 2022-12-15 ENCOUNTER — Encounter: Payer: Self-pay | Admitting: Rehabilitative and Restorative Service Providers"

## 2022-12-15 DIAGNOSIS — R293 Abnormal posture: Secondary | ICD-10-CM | POA: Diagnosis not present

## 2022-12-15 DIAGNOSIS — R262 Difficulty in walking, not elsewhere classified: Secondary | ICD-10-CM

## 2022-12-15 DIAGNOSIS — M6281 Muscle weakness (generalized): Secondary | ICD-10-CM | POA: Diagnosis not present

## 2022-12-15 DIAGNOSIS — M5459 Other low back pain: Secondary | ICD-10-CM | POA: Diagnosis not present

## 2022-12-15 NOTE — Therapy (Signed)
OUTPATIENT PHYSICAL THERAPY TREATMENT/PROGRESS NOTE  Progress Note Reporting Period 11/17/2022 to 12/15/2022  See note below for Objective Data and Assessment of Progress/Goals.      Patient Name: [name] MRN: [id number] DOB:1960/10/02, 62 y.o., female Today's Date: 12/15/2022  END OF SESSION:  PT End of Session - 12/15/22 1549     Visit Number 4    Number of Visits 11    Date for PT Re-Evaluation 01/12/23    Progress Note Due on Visit 11    PT Start Time 1549    PT Stop Time 1642    PT Time Calculation (min) 53 min    Activity Tolerance Patient tolerated treatment well;No increased pain    Behavior During Therapy WFL for tasks assessed/performed              Past Medical History:  Diagnosis Date   Anemia when she was young   Arthritis    Congestion of nasal sinus    Constipation    takes Colace every other day   DDD (degenerative disc disease), lumbar    Dependent edema    Diabetes mellitus without complication (HCC)    per pt had been diagnosed but then "number started going down so my doctor said i didnt have diabetes"   Dry eyes    uses Systane Eye drops daily as needed   GERD (gastroesophageal reflux disease) 12/06/2011   takes Nexium daily   Headache(784.0)    occasionally-d/t congestion   Heart murmur    History of bronchitis 6-7 yrs ago   Hyperlipidemia    Hypertension    takes Metoprolol and Diovan daily   Hypothyroidism 12/06/2011   takes Synthroid daily   Joint pain    Multiple allergies    takes Zyrtec daily;uses Nasonex daily as needed   Nocturia    Numbness    weakness-right hand   Onychomycosis    OSA on CPAP    Pancreatitis    Peripheral edema    takes Furosemide daily   Shingles    Urinary frequency    Venous insufficiency of leg    Past Surgical History:  Procedure Laterality Date   ABDOMINAL HYSTERECTOMY     uterine prolapse   BACK SURGERY     L4-5   CARDIAC SURGERY     CATARACT EXTRACTION, BILATERAL  2023    CHOLECYSTECTOMY     COLONOSCOPY     ESOPHAGOGASTRODUODENOSCOPY  5-7yrs ago   FOOT SURGERY Bilateral    KNEE ARTHROSCOPY     LAPAROSCOPIC GASTRIC SLEEVE RESECTION N/A 08/23/2016   Procedure: LAPAROSCOPIC GASTRIC SLEEVE RESECTION WITH UPPER ENDO;  Surgeon: Luretha Murphy, MD;  Location: [facility];  Service: General;  Laterality: N/A;   POSTERIOR LUMBAR FUSION  03/10/2009   L3-4 and L3-4 fusion, Dr. Hilda Lias   SHOULDER ARTHROSCOPY WITH ROTATOR CUFF REPAIR AND SUBACROMIAL DECOMPRESSION Right 08/23/2013   Procedure: RIGHT SHOULDER ARTHROSCOPY WITH  SUBACROMIAL DECOMPRESSION DISTAL CLAVICLE RESECTION AND  ROTATOR CUFF REPAIR ;  Surgeon: Senaida Lange, MD;  Location: [facility];  Service: Orthopedics;  Laterality: Right;   TUBAL LIGATION     Patient Active Problem List   Diagnosis Date Noted   Memory change 12/25/2020   Chronic pain syndrome 05/04/2019   Raynaud's disease 04/13/2019   Lumbar radiculopathy 12/18/2018   Spinal stenosis of lumbar region with neurogenic claudication 12/18/2018   Post laminectomy syndrome 12/18/2018   Bilateral primary osteoarthritis of knee 12/27/2016   History of excision of intestinal structure 09/07/2016  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 08/23/2016   Prediabetes 12/03/2015   OSA on CPAP 02/19/2015   Lung field abnormal 11/11/2014   Morbid obesity (HCC) 10/09/2014   Constipation 06/14/2014   Peripheral venous insufficiency 05/25/2013   Pulmonary HTN (HCC) 05/12/2013   Bilateral lower extremity edema 05/12/2013   Morbid obesity with BMI of 50.0-59.9, adult (HCC)    Shoulder joint pain 03/28/2013   Abnormal glucose level 02/20/2013   Alopecia 02/20/2013   Degeneration of lumbar intervertebral disc 02/20/2013   Hyperglycemia due to type 2 diabetes mellitus (HCC) 02/20/2013   Type 2 diabetes mellitus with other specified complication (HCC) 02/20/2013   Back pain, hx of prior back surgery 12/09/2011   Pancreatitis, mild, CT negative 12/08/2011    Fatty liver 12/08/2011   Substernal chest pain, suspected secondary to acute pancreatitis 12/06/2011   Hypertension 12/06/2011   Hypokalemia 12/06/2011   Hypothyroidism 12/06/2011   Anxiety 12/06/2011   BV (bacterial vaginosis)    Hyperlipidemia     PCP: Alysia Penna, MD  REFERRING PROVIDER: Eldred Manges, MD  REFERRING DIAG:  Diagnosis  M54.16 (ICD-10-CM) - Lumbar radiculopathy    Rationale for Evaluation and Treatment: Rehabilitation  THERAPY DIAG:  Abnormal posture  Other low back pain  Muscle weakness (generalized)  Difficulty in walking, not elsewhere classified  ONSET DATE: About a month ago  SUBJECTIVE:                                                                                                                                                                                           SUBJECTIVE STATEMENT: Trust notes progress with her back pain since starting PT.  Pain is noted with prolonged activities and later in the day.  PERTINENT HISTORY:  OA, lumbar DDD, fusion L3-5, Type 2 DM, HLD, HTN, hypothyroid, abdominal hysterectomy, previous knee and foot surgeries, Rt RTC repair, morbid obesity  PAIN:  NPRS scale: 3-4/10 over the past week Pain location: back Pain description: ache Aggravating factors: prolonged postures, fatigue Relieving factors: exercise and change of position   PRECAUTIONS: Back  RED FLAGS: None   WEIGHT BEARING RESTRICTIONS: No  FALLS:  Has patient fallen in last 6 months? No  LIVING ENVIRONMENT: Lives with: lives alone Lives in: House/apartment Stairs:  No issues Has following equipment at home: Single point cane  OCCUPATION: Works in medical records at [employer]  PLOF: Independent  PATIENT GOALS: Get back to normal activities without being limited by her back or knees  NEXT MD VISIT: Follow-up in 3 months  OBJECTIVE:  Note: Objective measures were completed at Evaluation unless otherwise noted.  DIAGNOSTIC  FINDINGS:  11/17/2022 AP lateral lumbar images are obtained and reviewed this shows instrumented  fusion L3-4 L4-5.  There is disc space narrowing and some sclerosis on the  posterior aspect of the endplate at L2-3.  Notes of loosening and single  cage appears stable.   Impression: Previous lumbar fusions instrumented L3-4, L4-5.  Adjacent  level progression L2-3.  No listhesis.  PATIENT SURVEYS:  12/15/2022: FOTO 59, was 42, Goal 64 in 11 visits  12/02/2022: FOTO update 54  11/17/2022 FOTO 50 (risk-adjusted 42, Goal 64 in 11 vists)  SCREENING FOR RED FLAGS: 11/17/2022 Bowel or bladder incontinence: No Spinal tumors: No Cauda equina syndrome: No Compression fracture: No Abdominal aneurysm: No  COGNITION: 11/17/2022 Overall cognitive status: Within functional limits for tasks assessed     SENSATION: 11/17/2022 Not with this episode, previously on the right side  MUSCLE LENGTH: 12/15/2022 Hamstrings: Right 45 deg; Left 50 deg  11/17/2022 Hamstrings: Right 45 deg; Left 45 deg  POSTURE:  11/17/2022 rounded shoulders, forward head, decreased lumbar lordosis, and flexed trunk   LUMBAR ROM:   AROM 11/17/2022 12/15/2022  Flexion    Extension 5 5  Right lateral flexion    Left lateral flexion    Right rotation    Left rotation     (Blank rows = not tested)  LOWER EXTREMITY ROM:     Passive  Left/Right 11/17/2022 Left/Right 12/15/2022  Hip flexion 85/90 90/90  Hip extension    Hip abduction    Hip adduction    Hip internal rotation 5/15 5/15  Hip external rotation 35/35 36/30  Knee flexion    Knee extension    Ankle dorsiflexion    Ankle plantarflexion    Ankle inversion    Ankle eversion     (Blank rows = not tested)  LOWER EXTREMITY STRENGTH:  Deferred at evaluation  MMT Left/Right 11/17/2022   Hip flexion    Hip extension    Hip abduction    Hip adduction    Hip internal rotation    Hip external rotation    Knee flexion    Knee extension     Ankle dorsiflexion    Ankle plantarflexion    Ankle inversion    Ankle eversion     (Blank rows = not tested)  GAIT: 11/17/2022 Distance walked: 50 feet Assistive device utilized: None Level of assistance: Complete Independence Comments: Jahliyah walks in a forward flexed posture.  She notes she is more comfortable using a shopping cart at the store and she does not typically walk more than about 10 minutes due to low back fatigue and knee pain.                  TODAY'S TREATMENT:                                                                                DATE:  12/15/2022 Lumbar extension AROM 10 x 3 seconds Shoulder blade pinches 10 x 5 seconds  Hip hike at counter top 2 sets of 10 for 3 seconds Prone alternating hip extensions 2 sets of 10 for 3 seconds Yoga bridge 2 sets of 10 x 5 seconds  Functional Activities: Reviewed spine  anatomy, practical lifting techniques like modified diagonal squat lift and golfers lift, bed mobility with logroll, reviewed posture and the importance of changing positions frequently, discussed/modified home walking and exercise program.   12/13/2022 Therex Nustep lvl 6 12 mins UE/LE aerobic exercise Standing green tband rows 2 x 15 c scapular retraction focus Standing  green band GH ext 2 x 15  Seated pball roll outs 5 sec X10 Seated ab set with pball isometric shoulder ext 5 sec X 10 and isometric hip flexion 5 sec X 5 Standing lumbar extensions 3 sec hold X10 Standing hip abduction X 10 bilat Standing hip extension X 10 bilat   DATE: 12/02/2022 Therex Nustep lvl 6 12 mins UE/LE aerobic exercise Seated UE lift into table for multifidi isometric hold 5 sec hold x 10  Seated UE table press down ab contraction 5 sec hold x 10 Seated green tband rows 2 x 15 c scapular retraction focus Standing green band GH ext 2 x 15   Additional time spent in education and review of benefits from aerobic exercise and gym based usage in future pending  symptom response and availability based off membership.  Question and answer time given to improve knowledge.  Verbal review of existing HEP for technique follow up.    11/17/2022 Gentle Lumbar extension AROM 10 x 3 seconds Shoulder blade pinches 10 x 5 seconds  Heel to toe raises with transversus abdominus activation 10 x 3 seconds Yoga bridge 10 x 3 seconds  Functional Activities: Reviewed spine anatomy with spine model, practical lifting techniques like a modified diagonal squat lift and golfers lift, also reviewed bed mobility with logroll and modified golfers lift, discussed posture and the importance of changing positions frequently, discussed home walking and exercise program.   PATIENT EDUCATION:  11/17/2022 Education details: See above Person educated: Patient Education method: Explanation, Demonstration, Tactile cues, Verbal cues, and Handouts Education comprehension: verbalized understanding, returned demonstration, verbal cues required, tactile cues required, and needs further education  HOME EXERCISE PROGRAM: Access Code: [email or web address] URL: [email or web address] Date: 12/15/2022 Prepared by: Pauletta Browns  Exercises - Standing Lumbar Extension at Wall - Forearms  - 3-5 x daily - 7 x weekly - 1 sets - 5 reps - 3 seconds hold - Yoga Bridge  - 2 x daily - 7 x weekly - 2 sets - 10 reps - 5 seconds hold - Standing Scapular Retraction  - 5 x daily - 7 x weekly - 1 sets - 5 reps - 5 second hold - Prone Hip Extension  - 2 x daily - 7 x weekly - 2 sets - 10 reps - 3 seconds hold - Standing Hip Hiking  - 3-5 x daily - 7 x weekly - 1 sets - 10 reps - 3 seconds hold  ASSESSMENT:  CLINICAL IMPRESSION: [name] is making subjective, objective and functional progress towards long-term goals.  Increasing low back/core strength and postural awareness should allow her to meet all long-term goals in the expected time.  OBJECTIVE IMPAIRMENTS: decreased activity tolerance,  decreased endurance, decreased knowledge of condition, difficulty walking, decreased ROM, decreased strength, decreased safety awareness, impaired perceived functional ability, impaired flexibility, improper body mechanics, postural dysfunction, obesity, and pain.   ACTIVITY LIMITATIONS: carrying, lifting, bending, sitting, standing, squatting, sleeping, bed mobility, and locomotion level  PARTICIPATION LIMITATIONS: shopping, community activity, and occupation  PERSONAL FACTORS: OA, lumbar DDD, fusion L3-5, Type 2 DM, HLD, HTN, hypothyroid, abdominal hysterectomy, previous knee and foot surgeries, Rt RTC repair, morbid obesity are also affecting  patient's functional outcome.   REHAB POTENTIAL: Good  CLINICAL DECISION MAKING: Stable/uncomplicated  EVALUATION COMPLEXITY: Low   GOALS: Goals reviewed with patient? Yes  SHORT TERM GOALS: Target date: 12/15/2022  [name] will be independent with her day 1 home exercise program Baseline: Started 11/17/2022 Goal status: Met 12/15/2022  2.  [name] will be able to stand in a more upright posture due to decreased hip flexors tightness and improved lumbar extensor strength Baseline: Stands in a forward flexed posture at evaluation 11/17/2022 Goal status: Met 12/15/2022   LONG TERM GOALS: Target date: 01/12/2023  Improve FOTO to 64 in 11 visits (50, risk adjusted 42 at evaluation) Baseline: 50, risk adjusted 42 Goal status: On Going 12/15/2022  2.  [name] will report low back pain consistently 0-3/10 on the numeric pain rating scale Baseline: 3-5/10 Goal status: On Going 12/15/2022  3.  Improve bilateral hip flexors flexibility to at least 90 degrees Baseline: 80/90 degrees Goal status: Met 12/15/2022  4.  Improve bilateral quadriceps and lumbar extensors strength as assessed by improved standing and walking endurance, FOTO scores and objective measures Baseline: Stands in a flexed posture, limited to 10 minutes or less of walking  and objective measures deferred at evaluation secondary to these findings and other functional deficits Goal status: On Going 12/15/2022  5.  [name] will be independent with her long-term maintenance home exercise program at discharge Baseline: Started 11/17/2022 Goal status: On Going 12/15/2022  PLAN:  PT FREQUENCY: 1-2x/week  PT DURATION: 4 weeks  PLANNED INTERVENTIONS: 97110-Therapeutic exercises, 97530- Therapeutic activity, 97112- Neuromuscular re-education, 97535- Self Care, [id number]- Manual therapy, Patient/Family education, Dry Needling, Cryotherapy, and Moist heat.  PLAN FOR NEXT SESSION: Review current brief, focused HEP, practical work on posture and body mechanics for transfer into independent PT when ready in 2-4 weeks.   Cherlyn Cushing PT, MPT 12/15/22 4:52 PM

## 2022-12-16 ENCOUNTER — Other Ambulatory Visit (HOSPITAL_COMMUNITY): Payer: Self-pay

## 2022-12-17 ENCOUNTER — Other Ambulatory Visit (HOSPITAL_COMMUNITY): Payer: Self-pay

## 2022-12-17 DIAGNOSIS — G4733 Obstructive sleep apnea (adult) (pediatric): Secondary | ICD-10-CM | POA: Diagnosis not present

## 2022-12-17 MED ORDER — LEVOTHYROXINE SODIUM 75 MCG PO TABS
75.0000 ug | ORAL_TABLET | Freq: Every morning | ORAL | 2 refills | Status: AC
  Filled 2022-12-17 – 2023-05-18 (×5): qty 90, 90d supply, fill #0
  Filled 2023-08-11: qty 90, 90d supply, fill #1

## 2022-12-20 ENCOUNTER — Ambulatory Visit (INDEPENDENT_AMBULATORY_CARE_PROVIDER_SITE_OTHER): Payer: Commercial Managed Care - PPO | Admitting: Rehabilitative and Restorative Service Providers"

## 2022-12-20 ENCOUNTER — Encounter: Payer: Self-pay | Admitting: Rehabilitative and Restorative Service Providers"

## 2022-12-20 DIAGNOSIS — R293 Abnormal posture: Secondary | ICD-10-CM | POA: Diagnosis not present

## 2022-12-20 DIAGNOSIS — R262 Difficulty in walking, not elsewhere classified: Secondary | ICD-10-CM | POA: Diagnosis not present

## 2022-12-20 DIAGNOSIS — M5459 Other low back pain: Secondary | ICD-10-CM

## 2022-12-20 DIAGNOSIS — M6281 Muscle weakness (generalized): Secondary | ICD-10-CM

## 2022-12-20 NOTE — Therapy (Signed)
OUTPATIENT PHYSICAL THERAPY TREATMENT NOTE  Patient Name: [name] MRN: [id number] DOB:[birth year], 62 y.o., female Today's Date: 12/20/2022  END OF SESSION:  PT End of Session - 12/20/22 1600     Visit Number 5    Number of Visits 11    Date for PT Re-Evaluation 01/12/23    Progress Note Due on Visit 11    PT Start Time 1600    PT Stop Time 1638    PT Time Calculation (min) 38 min    Activity Tolerance Patient tolerated treatment well;No increased pain    Behavior During Therapy WFL for tasks assessed/performed             Past Medical History:  Diagnosis Date   Anemia when she was young   Arthritis    Congestion of nasal sinus    Constipation    takes Colace every other day   DDD (degenerative disc disease), lumbar    Dependent edema    Diabetes mellitus without complication (HCC)    per pt had been diagnosed but then "number started going down so my doctor said i didnt have diabetes"   Dry eyes    uses Systane Eye drops daily as needed   GERD (gastroesophageal reflux disease) 12/06/2011   takes Nexium daily   Headache(784.0)    occasionally-d/t congestion   Heart murmur    History of bronchitis 6-7 yrs ago   Hyperlipidemia    Hypertension    takes Metoprolol and Diovan daily   Hypothyroidism 12/06/2011   takes Synthroid daily   Joint pain    Multiple allergies    takes Zyrtec daily;uses Nasonex daily as needed   Nocturia    Numbness    weakness-right hand   Onychomycosis    OSA on CPAP    Pancreatitis    Peripheral edema    takes Furosemide daily   Shingles    Urinary frequency    Venous insufficiency of leg    Past Surgical History:  Procedure Laterality Date   ABDOMINAL HYSTERECTOMY     uterine prolapse   BACK SURGERY     L4-5   CARDIAC SURGERY     CATARACT EXTRACTION, BILATERAL  2023   CHOLECYSTECTOMY     COLONOSCOPY     ESOPHAGOGASTRODUODENOSCOPY  5-77yrs ago   FOOT SURGERY Bilateral    KNEE ARTHROSCOPY     LAPAROSCOPIC GASTRIC  SLEEVE RESECTION N/A 08/23/2016   Procedure: LAPAROSCOPIC GASTRIC SLEEVE RESECTION WITH UPPER ENDO;  Surgeon: Luretha Murphy, MD;  Location: [facility];  Service: General;  Laterality: N/A;   POSTERIOR LUMBAR FUSION  03/10/2009   L3-4 and L3-4 fusion, Dr. Hilda Lias   SHOULDER ARTHROSCOPY WITH ROTATOR CUFF REPAIR AND SUBACROMIAL DECOMPRESSION Right 08/23/2013   Procedure: RIGHT SHOULDER ARTHROSCOPY WITH  SUBACROMIAL DECOMPRESSION DISTAL CLAVICLE RESECTION AND  ROTATOR CUFF REPAIR ;  Surgeon: Senaida Lange, MD;  Location: [facility];  Service: Orthopedics;  Laterality: Right;   TUBAL LIGATION     Patient Active Problem List   Diagnosis Date Noted   Memory change 12/25/2020   Chronic pain syndrome 05/04/2019   Raynaud's disease 04/13/2019   Lumbar radiculopathy 12/18/2018   Spinal stenosis of lumbar region with neurogenic claudication 12/18/2018   Post laminectomy syndrome 12/18/2018   Bilateral primary osteoarthritis of knee 12/27/2016   History of excision of intestinal structure 09/07/2016   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 08/23/2016   Prediabetes 12/03/2015   OSA on CPAP 02/19/2015   Lung field abnormal  11/11/2014   Morbid obesity (HCC) 10/09/2014   Constipation 06/14/2014   Peripheral venous insufficiency 05/25/2013   Pulmonary HTN (HCC) 05/12/2013   Bilateral lower extremity edema 05/12/2013   Morbid obesity with BMI of 50.0-59.9, adult (HCC)    Shoulder joint pain 03/28/2013   Abnormal glucose level 02/20/2013   Alopecia 02/20/2013   Degeneration of lumbar intervertebral disc 02/20/2013   Hyperglycemia due to type 2 diabetes mellitus (HCC) 02/20/2013   Type 2 diabetes mellitus with other specified complication (HCC) 02/20/2013   Back pain, hx of prior back surgery 12/09/2011   Pancreatitis, mild, CT negative 12/08/2011   Fatty liver 12/08/2011   Substernal chest pain, suspected secondary to acute pancreatitis 12/06/2011   Hypertension 12/06/2011   Hypokalemia  12/06/2011   Hypothyroidism 12/06/2011   Anxiety 12/06/2011   BV (bacterial vaginosis)    Hyperlipidemia     PCP: Alysia Penna, MD  REFERRING PROVIDER: Eldred Manges, MD  REFERRING DIAG:  Diagnosis  M54.16 (ICD-10-CM) - Lumbar radiculopathy    Rationale for Evaluation and Treatment: Rehabilitation  THERAPY DIAG:  Abnormal posture  Other low back pain  Muscle weakness (generalized)  Difficulty in walking, not elsewhere classified  ONSET DATE: About a month ago  SUBJECTIVE:                                                                                                                                                                                           SUBJECTIVE STATEMENT: [name] notes progress with her back pain since starting PT.  Pain is noted with prolonged activities and later in the day, although this has improved significantly.  PERTINENT HISTORY:  OA, lumbar DDD, fusion L3-5, Type 2 DM, HLD, HTN, hypothyroid, abdominal hysterectomy, previous knee and foot surgeries, Rt RTC repair, morbid obesity  PAIN:  NPRS scale: 2-4/10 since her last visit Pain location: back Pain description: ache Aggravating factors: prolonged postures, fatigue Relieving factors: exercise and change of position   PRECAUTIONS: Back  RED FLAGS: None   WEIGHT BEARING RESTRICTIONS: No  FALLS:  Has patient fallen in last 6 months? No  LIVING ENVIRONMENT: Lives with: lives alone Lives in: House/apartment Stairs:  No issues Has following equipment at home: Single point cane  OCCUPATION: Works in medical records at [employer]  PLOF: Independent  PATIENT GOALS: Get back to normal activities without being limited by her back or knees  NEXT MD VISIT: Follow-up in 3 months  OBJECTIVE:  Note: Objective measures were completed at Evaluation unless otherwise noted.  DIAGNOSTIC FINDINGS:  11/17/2022 AP lateral lumbar images are obtained and reviewed this shows instrumented   fusion L3-4 L4-5.  There is disc space narrowing and some sclerosis on the  posterior aspect of the endplate at L2-3.  Notes of loosening and single  cage appears stable.   Impression: Previous lumbar fusions instrumented L3-4, L4-5.  Adjacent  level progression L2-3.  No listhesis.  PATIENT SURVEYS:  12/15/2022: FOTO 59, was 42, Goal 64 in 11 visits  12/02/2022: FOTO update 54  11/17/2022 FOTO 50 (risk-adjusted 42, Goal 64 in 11 vists)  SCREENING FOR RED FLAGS: 11/17/2022 Bowel or bladder incontinence: No Spinal tumors: No Cauda equina syndrome: No Compression fracture: No Abdominal aneurysm: No  COGNITION: 11/17/2022 Overall cognitive status: Within functional limits for tasks assessed     SENSATION: 11/17/2022 Not with this episode, previously on the right side  MUSCLE LENGTH: 12/15/2022 Hamstrings: Right 45 deg; Left 50 deg  11/17/2022 Hamstrings: Right 45 deg; Left 45 deg  POSTURE:  11/17/2022 rounded shoulders, forward head, decreased lumbar lordosis, and flexed trunk   LUMBAR ROM:   AROM 11/17/2022 12/15/2022  Flexion    Extension 5 5  Right lateral flexion    Left lateral flexion    Right rotation    Left rotation     (Blank rows = not tested)  LOWER EXTREMITY ROM:     Passive  Left/Right 11/17/2022 Left/Right 12/15/2022  Hip flexion 85/90 90/90  Hip extension    Hip abduction    Hip adduction    Hip internal rotation 5/15 5/15  Hip external rotation 35/35 36/30  Knee flexion    Knee extension    Ankle dorsiflexion    Ankle plantarflexion    Ankle inversion    Ankle eversion     (Blank rows = not tested)  LOWER EXTREMITY STRENGTH:  Deferred at evaluation  MMT Left/Right 11/17/2022   Hip flexion    Hip extension    Hip abduction    Hip adduction    Hip internal rotation    Hip external rotation    Knee flexion    Knee extension    Ankle dorsiflexion    Ankle plantarflexion    Ankle inversion    Ankle eversion     (Blank rows  = not tested)  GAIT: 11/17/2022 Distance walked: 50 feet Assistive device utilized: None Level of assistance: Complete Independence Comments: [name] walks in a forward flexed posture.  She notes she is more comfortable using a shopping cart at the store and she does not typically walk more than about 10 minutes due to low back fatigue and knee pain.                  TODAY'S TREATMENT:                                                                                DATE:  12/20/2022 Lumbar extension AROM 10 x 3 seconds Shoulder blade pinches 10 x 5 seconds  Hip hike at counter top 2 sets of 10 for 3 seconds Prone alternating hip extensions 2 sets of 10 for 3 seconds Yoga bridge 2 sets of 10 x 5 seconds  Functional Activities:  Sit to stand 5 x slow eccentrics Reviewed practical lifting techniques like modified diagonal squat lift for water  at the grocery store and golfers lift for getting items out of low cabinets, bed mobility with logroll, reviewed posture and the importance of changing positions frequently, discussed home walking and exercise program.   12/15/2022 Lumbar extension AROM 10 x 3 seconds Shoulder blade pinches 10 x 5 seconds  Hip hike at counter top 2 sets of 10 for 3 seconds Prone alternating hip extensions 2 sets of 10 for 3 seconds Yoga bridge 2 sets of 10 x 5 seconds  Functional Activities: Reviewed spine anatomy, practical lifting techniques like modified diagonal squat lift and golfers lift, bed mobility with logroll, reviewed posture and the importance of changing positions frequently, discussed/modified home walking and exercise program.   12/13/2022 Therex Nustep lvl 6 12 mins UE/LE aerobic exercise Standing green tband rows 2 x 15 c scapular retraction focus Standing  green band GH ext 2 x 15  Seated pball roll outs 5 sec X10 Seated ab set with pball isometric shoulder ext 5 sec X 10 and isometric hip flexion 5 sec X 5 Standing lumbar extensions 3 sec  hold X10 Standing hip abduction X 10 bilat Standing hip extension X 10 bilat   PATIENT EDUCATION:  11/17/2022 Education details: See above Person educated: Patient Education method: Explanation, Demonstration, Tactile cues, Verbal cues, and Handouts Education comprehension: verbalized understanding, returned demonstration, verbal cues required, tactile cues required, and needs further education  HOME EXERCISE PROGRAM: Access Code: [email or web address] URL: [email or web address] Date: 12/20/2022 Prepared by: Pauletta Browns  Exercises - Standing Lumbar Extension at Wall - Forearms  - 3-5 x daily - 7 x weekly - 1 sets - 5 reps - 3 seconds hold - Yoga Bridge  - 1-2 x daily - 7 x weekly - 2 sets - 10 reps - 5 seconds hold - Standing Scapular Retraction  - 5 x daily - 7 x weekly - 1 sets - 5 reps - 5 second hold - Prone Hip Extension  - 1-2 x daily - 7 x weekly - 2 sets - 10 reps - 3 seconds hold - Standing Hip Hiking  - 3-5 x daily - 7 x weekly - 1 sets - 10 reps - 3 seconds hold - Sit to Stand with Armchair  - 2 x daily - 7 x weekly - 1 sets - 5 reps  ASSESSMENT:  CLINICAL IMPRESSION: Jakyla has made excellent progress with her supervised physical therapy.  Pain is much more under control and more severe episodes are infrequent and handled with Tylenol.  [name] will benefit from 1 additional visit to refresh her memory on body mechanics and her long-term home exercise program.  At that time she will be appropriate for transfer into independent rehabilitation, if she agrees.  OBJECTIVE IMPAIRMENTS: decreased activity tolerance, decreased endurance, decreased knowledge of condition, difficulty walking, decreased ROM, decreased strength, decreased safety awareness, impaired perceived functional ability, impaired flexibility, improper body mechanics, postural dysfunction, obesity, and pain.   ACTIVITY LIMITATIONS: carrying, lifting, bending, sitting, standing, squatting, sleeping, bed  mobility, and locomotion level  PARTICIPATION LIMITATIONS: shopping, community activity, and occupation  PERSONAL FACTORS: OA, lumbar DDD, fusion L3-5, Type 2 DM, HLD, HTN, hypothyroid, abdominal hysterectomy, previous knee and foot surgeries, Rt RTC repair, morbid obesity are also affecting patient's functional outcome.   REHAB POTENTIAL: Good  CLINICAL DECISION MAKING: Stable/uncomplicated  EVALUATION COMPLEXITY: Low   GOALS: Goals reviewed with patient? Yes  SHORT TERM GOALS: Target date: 12/15/2022  [name] will be independent with her day 1 home exercise program Baseline: Started 11/17/2022 Goal  status: Met 12/15/2022  2.  [name] will be able to stand in a more upright posture due to decreased hip flexors tightness and improved lumbar extensor strength Baseline: Stands in a forward flexed posture at evaluation 11/17/2022 Goal status: Met 12/15/2022   LONG TERM GOALS: Target date: 01/12/2023  Improve FOTO to 64 in 11 visits (50, risk adjusted 42 at evaluation) Baseline: 50, risk adjusted 42 Goal status: On Going 12/15/2022  2.  [name] will report low back pain consistently 0-3/10 on the numeric pain rating scale Baseline: 3-5/10 Goal status: On Going 12/20/2022  3.  Improve bilateral hip flexors flexibility to at least 90 degrees Baseline: 80/90 degrees Goal status: Met 12/15/2022  4.  Improve bilateral quadriceps and lumbar extensors strength as assessed by improved standing and walking endurance, FOTO scores and objective measures Baseline: Stands in a flexed posture, limited to 10 minutes or less of walking and objective measures deferred at evaluation secondary to these findings and other functional deficits Goal status: Met 12/20/2022  5.  [name] will be independent with her long-term maintenance home exercise program at discharge Baseline: Started 11/17/2022 Goal status: Met 12/20/2022 PLAN:  PT FREQUENCY: 1-2x/week  PT DURATION: 4 weeks  PLANNED  INTERVENTIONS: 97110-Therapeutic exercises, 97530- Therapeutic activity, 97112- Neuromuscular re-education, 97535- Self Care, 40347- Manual therapy, Patient/Family education, Dry Needling, Cryotherapy, and Moist heat.  PLAN FOR NEXT SESSION: Review current brief, focused HEP, practical work on posture and body mechanics for transfer into independent PT.  DC summary, FOTO and close episode.  Cherlyn Cushing PT, MPT 12/20/22 4:57 PM

## 2022-12-22 ENCOUNTER — Encounter: Payer: Self-pay | Admitting: Physical Therapy

## 2022-12-22 ENCOUNTER — Ambulatory Visit: Payer: Commercial Managed Care - PPO | Admitting: Physical Therapy

## 2022-12-22 DIAGNOSIS — M5459 Other low back pain: Secondary | ICD-10-CM

## 2022-12-22 DIAGNOSIS — R262 Difficulty in walking, not elsewhere classified: Secondary | ICD-10-CM

## 2022-12-22 DIAGNOSIS — R293 Abnormal posture: Secondary | ICD-10-CM

## 2022-12-22 DIAGNOSIS — M6281 Muscle weakness (generalized): Secondary | ICD-10-CM

## 2022-12-22 NOTE — Therapy (Signed)
OUTPATIENT PHYSICAL THERAPY TREATMENT/DISCHARGE PHYSICAL THERAPY DISCHARGE SUMMARY  Visits from Start of Care: 6  Current functional level related to goals / functional outcomes: See below   Remaining deficits: See below   Education / Equipment: HEP  Plan:  Patient goals were met. Patient is being discharged due to reaching PT goals and feels ready to transition to independent program.      Patient Name: [name] MRN: [id number] DOB:April 01, 1960, 62 y.o., female Today's Date: 12/22/2022  END OF SESSION:  PT End of Session - 12/22/22 1613     Visit Number 6    Number of Visits 11    Date for PT Re-Evaluation 01/12/23    Progress Note Due on Visit 11    PT Start Time 1608    PT Stop Time 1633    PT Time Calculation (min) 25 min    Activity Tolerance Patient tolerated treatment well;No increased pain    Behavior During Therapy WFL for tasks assessed/performed             Past Medical History:  Diagnosis Date   Anemia when she was young   Arthritis    Congestion of nasal sinus    Constipation    takes Colace every other day   DDD (degenerative disc disease), lumbar    Dependent edema    Diabetes mellitus without complication (HCC)    per pt had been diagnosed but then "number started going down so my doctor said i didnt have diabetes"   Dry eyes    uses Systane Eye drops daily as needed   GERD (gastroesophageal reflux disease) 12/06/2011   takes Nexium daily   Headache(784.0)    occasionally-d/t congestion   Heart murmur    History of bronchitis 6-7 yrs ago   Hyperlipidemia    Hypertension    takes Metoprolol and Diovan daily   Hypothyroidism 12/06/2011   takes Synthroid daily   Joint pain    Multiple allergies    takes Zyrtec daily;uses Nasonex daily as needed   Nocturia    Numbness    weakness-right hand   Onychomycosis    OSA on CPAP    Pancreatitis    Peripheral edema    takes Furosemide daily   Shingles    Urinary frequency    Venous  insufficiency of leg    Past Surgical History:  Procedure Laterality Date   ABDOMINAL HYSTERECTOMY     uterine prolapse   BACK SURGERY     L4-5   CARDIAC SURGERY     CATARACT EXTRACTION, BILATERAL  2023   CHOLECYSTECTOMY     COLONOSCOPY     ESOPHAGOGASTRODUODENOSCOPY  5-54yrs ago   FOOT SURGERY Bilateral    KNEE ARTHROSCOPY     LAPAROSCOPIC GASTRIC SLEEVE RESECTION N/A 08/23/2016   Procedure: LAPAROSCOPIC GASTRIC SLEEVE RESECTION WITH UPPER ENDO;  Surgeon: Luretha Murphy, MD;  Location: [facility];  Service: General;  Laterality: N/A;   POSTERIOR LUMBAR FUSION  03/10/2009   L3-4 and L3-4 fusion, Dr. Hilda Lias   SHOULDER ARTHROSCOPY WITH ROTATOR CUFF REPAIR AND SUBACROMIAL DECOMPRESSION Right 08/23/2013   Procedure: RIGHT SHOULDER ARTHROSCOPY WITH  SUBACROMIAL DECOMPRESSION DISTAL CLAVICLE RESECTION AND  ROTATOR CUFF REPAIR ;  Surgeon: Senaida Lange, MD;  Location: [facility];  Service: Orthopedics;  Laterality: Right;   TUBAL LIGATION     Patient Active Problem List   Diagnosis Date Noted   Memory change 12/25/2020   Chronic pain syndrome 05/04/2019   Raynaud's disease 04/13/2019  Lumbar radiculopathy 12/18/2018   Spinal stenosis of lumbar region with neurogenic claudication 12/18/2018   Post laminectomy syndrome 12/18/2018   Bilateral primary osteoarthritis of knee 12/27/2016   History of excision of intestinal structure 09/07/2016   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 08/23/2016   Prediabetes 12/03/2015   OSA on CPAP 02/19/2015   Lung field abnormal 11/11/2014   Morbid obesity (HCC) 10/09/2014   Constipation 06/14/2014   Peripheral venous insufficiency 05/25/2013   Pulmonary HTN (HCC) 05/12/2013   Bilateral lower extremity edema 05/12/2013   Morbid obesity with BMI of 50.0-59.9, adult (HCC)    Shoulder joint pain 03/28/2013   Abnormal glucose level 02/20/2013   Alopecia 02/20/2013   Degeneration of lumbar intervertebral disc 02/20/2013   Hyperglycemia due to  type 2 diabetes mellitus (HCC) 02/20/2013   Type 2 diabetes mellitus with other specified complication (HCC) 02/20/2013   Back pain, hx of prior back surgery 12/09/2011   Pancreatitis, mild, CT negative 12/08/2011   Fatty liver 12/08/2011   Substernal chest pain, suspected secondary to acute pancreatitis 12/06/2011   Hypertension 12/06/2011   Hypokalemia 12/06/2011   Hypothyroidism 12/06/2011   Anxiety 12/06/2011   BV (bacterial vaginosis)    Hyperlipidemia     PCP: Alysia Penna, MD  REFERRING PROVIDER: Eldred Manges, MD  REFERRING DIAG:  Diagnosis  M54.16 (ICD-10-CM) - Lumbar radiculopathy    Rationale for Evaluation and Treatment: Rehabilitation  THERAPY DIAG:  Abnormal posture  Other low back pain  Muscle weakness (generalized)  Difficulty in walking, not elsewhere classified  ONSET DATE: About a month ago  SUBJECTIVE:                                                                                                                                                                                           SUBJECTIVE STATEMENT: Tata notes progress with her back pain since starting PT.  Pain is noted with prolonged activities and later in the day, although this has improved significantly.  PERTINENT HISTORY:  OA, lumbar DDD, fusion L3-5, Type 2 DM, HLD, HTN, hypothyroid, abdominal hysterectomy, previous knee and foot surgeries, Rt RTC repair, morbid obesity  PAIN:  NPRS scale: 2-4/10 since her last visit Pain location: back Pain description: ache Aggravating factors: prolonged postures, fatigue Relieving factors: exercise and change of position   PRECAUTIONS: Back  RED FLAGS: None   WEIGHT BEARING RESTRICTIONS: No  FALLS:  Has patient fallen in last 6 months? No  LIVING ENVIRONMENT: Lives with: lives alone Lives in: House/apartment Stairs:  No issues Has following equipment at home: Single point cane  OCCUPATION: Works in medical records at  [employer]  PLOF: Independent  PATIENT GOALS: Get back to normal activities without being limited by her back or knees  NEXT MD VISIT: Follow-up in 3 months  OBJECTIVE:  Note: Objective measures were completed at Evaluation unless otherwise noted.  DIAGNOSTIC FINDINGS:  11/17/2022 AP lateral lumbar images are obtained and reviewed this shows instrumented  fusion L3-4 L4-5.  There is disc space narrowing and some sclerosis on the  posterior aspect of the endplate at L2-3.  Notes of loosening and single  cage appears stable.   Impression: Previous lumbar fusions instrumented L3-4, L4-5.  Adjacent  level progression L2-3.  No listhesis.  PATIENT SURVEYS:  12/22/22: FOTO 63%   12/15/2022: FOTO 59, was 42, Goal 64 in 11 visits  12/02/2022: FOTO update 54  11/17/2022 FOTO 50 (risk-adjusted 42, Goal 64 in 11 vists)  SCREENING FOR RED FLAGS: 11/17/2022 Bowel or bladder incontinence: No Spinal tumors: No Cauda equina syndrome: No Compression fracture: No Abdominal aneurysm: No  COGNITION: 11/17/2022 Overall cognitive status: Within functional limits for tasks assessed     SENSATION: 11/17/2022 Not with this episode, previously on the right side  MUSCLE LENGTH: 12/15/2022 Hamstrings: Right 45 deg; Left 50 deg  11/17/2022 Hamstrings: Right 45 deg; Left 45 deg  POSTURE:  11/17/2022 rounded shoulders, forward head, decreased lumbar lordosis, and flexed trunk   LUMBAR ROM:   AROM 11/17/2022 12/15/2022  Flexion    Extension 5 5  Right lateral flexion    Left lateral flexion    Right rotation    Left rotation     (Blank rows = not tested)  LOWER EXTREMITY ROM:     Passive  Left/Right 11/17/2022 Left/Right 12/15/2022  Hip flexion 85/90 90/90  Hip extension    Hip abduction    Hip adduction    Hip internal rotation 5/15 5/15  Hip external rotation 35/35 36/30  Knee flexion    Knee extension    Ankle dorsiflexion    Ankle plantarflexion    Ankle inversion     Ankle eversion     (Blank rows = not tested)  LOWER EXTREMITY STRENGTH:  Deferred at evaluation  MMT Left/Right 11/17/2022   Hip flexion    Hip extension    Hip abduction    Hip adduction    Hip internal rotation    Hip external rotation    Knee flexion    Knee extension    Ankle dorsiflexion    Ankle plantarflexion    Ankle inversion    Ankle eversion     (Blank rows = not tested)  GAIT: 11/17/2022 Distance walked: 50 feet Assistive device utilized: None Level of assistance: Complete Independence Comments: K[place]et walks in a forward flexed posture.  She notes she is more comfortable using a shopping cart at the store and she does not typically walk more than about 10 minutes due to low back fatigue and knee pain.                   12/22/2022 Therex Nustep lvl 6 10 mins UE/LE aerobic exercise Standing red tband rows 2 x 10 c scapular retraction focus Standing  red band GH ext 2 x 10  Standing hip hikes 2X10 bilat Standing lumbar extensions 3 sec hold X10 Standing hip extension 2 X 10 bilat Reviewed lifting mechanics  Standing lumbar extensions X 10  TODAY'S TREATMENT:  DATE:  12/20/2022 Lumbar extension AROM 10 x 3 seconds Shoulder blade pinches 10 x 5 seconds  Hip hike at counter top 2 sets of 10 for 3 seconds Prone alternating hip extensions 2 sets of 10 for 3 seconds Yoga bridge 2 sets of 10 x 5 seconds  Functional Activities:  Sit to stand 5 x slow eccentrics Reviewed practical lifting techniques like modified diagonal squat lift for water at the grocery store and golfers lift for getting items out of low cabinets, bed mobility with logroll, reviewed posture and the importance of changing positions frequently, discussed home walking and exercise program.   12/15/2022 Lumbar extension AROM 10 x 3 seconds Shoulder blade pinches 10 x 5 seconds  Hip hike at counter top 2 sets of  10 for 3 seconds Prone alternating hip extensions 2 sets of 10 for 3 seconds Yoga bridge 2 sets of 10 x 5 seconds  Functional Activities: Reviewed spine anatomy, practical lifting techniques like modified diagonal squat lift and golfers lift, bed mobility with logroll, reviewed posture and the importance of changing positions frequently, discussed/modified home walking and exercise program.    PATIENT EDUCATION:  11/17/2022 Education details: See above Person educated: Patient Education method: Explanation, Demonstration, Tactile cues, Verbal cues, and Handouts Education comprehension: verbalized understanding, returned demonstration, verbal cues required, tactile cues required, and needs further education  HOME EXERCISE PROGRAM: Access Code: [email or web address] URL: [email or web address] Date: 12/20/2022 Prepared by: Pauletta Browns  Exercises - Standing Lumbar Extension at Wall - Forearms  - 3-5 x daily - 7 x weekly - 1 sets - 5 reps - 3 seconds hold - Yoga Bridge  - 1-2 x daily - 7 x weekly - 2 sets - 10 reps - 5 seconds hold - Standing Scapular Retraction  - 5 x daily - 7 x weekly - 1 sets - 5 reps - 5 second hold - Prone Hip Extension  - 1-2 x daily - 7 x weekly - 2 sets - 10 reps - 3 seconds hold - Standing Hip Hiking  - 3-5 x daily - 7 x weekly - 1 sets - 10 reps - 3 seconds hold - Sit to Stand with Armchair  - 2 x daily - 7 x weekly - 1 sets - 5 reps  ASSESSMENT:  CLINICAL IMPRESSION: She has met PT goals and feels ready to transition to independent program.   OBJECTIVE IMPAIRMENTS: decreased activity tolerance, decreased endurance, decreased knowledge of condition, difficulty walking, decreased ROM, decreased strength, decreased safety awareness, impaired perceived functional ability, impaired flexibility, improper body mechanics, postural dysfunction, obesity, and pain.   ACTIVITY LIMITATIONS: carrying, lifting, bending, sitting, standing, squatting, sleeping, bed  mobility, and locomotion level  PARTICIPATION LIMITATIONS: shopping, community activity, and occupation  PERSONAL FACTORS: OA, lumbar DDD, fusion L3-5, Type 2 DM, HLD, HTN, hypothyroid, abdominal hysterectomy, previous knee and foot surgeries, Rt RTC repair, morbid obesity are also affecting patient's functional outcome.   REHAB POTENTIAL: Good  CLINICAL DECISION MAKING: Stable/uncomplicated  EVALUATION COMPLEXITY: Low   GOALS: Goals reviewed with patient? Yes  SHORT TERM GOALS: Target date: 12/15/2022  Mariely will be independent with her day 1 home exercise program Baseline: Started 11/17/2022 Goal status: Met 12/15/2022  2.  Sharmika will be able to stand in a more upright posture due to decreased hip flexors tightness and improved lumbar extensor strength Baseline: Stands in a forward flexed posture at evaluation 11/17/2022 Goal status: Met 12/15/2022   LONG TERM GOALS: Target date: 01/12/2023  Improve FOTO to 64 in  11 visits (50, risk adjusted 42 at evaluation) Baseline: 50, risk adjusted 42 Goal status: mostlty met, improved to 63%  2.  Ragena will report low back pain consistently 0-3/10 on the numeric pain rating scale Baseline: 3-5/10 Goal status: MET 12/22/22  3.  Improve bilateral hip flexors flexibility to at least 90 degrees Baseline: 80/90 degrees Goal status: Met 12/15/2022  4.  Improve bilateral quadriceps and lumbar extensors strength as assessed by improved standing and walking endurance, FOTO scores and objective measures Baseline: Stands in a flexed posture, limited to 10 minutes or less of walking and objective measures deferred at evaluation secondary to these findings and other functional deficits Goal status: Met 12/20/2022  5.  Aarna will be independent with her long-term maintenance home exercise program at discharge Baseline: Started 11/17/2022 Goal status: Met 12/20/2022 PLAN:  PT FREQUENCY: 1-2x/week  PT DURATION: 4 weeks  PLANNED  INTERVENTIONS: 97110-Therapeutic exercises, 97530- Therapeutic activity, 97112- Neuromuscular re-education, 97535- Self Care, 16109- Manual therapy, Patient/Family education, Dry Needling, Cryotherapy, and Moist heat.  PLAN FOR NEXT SESSION: DC today  Ivery Quale, PT, DPT 12/22/22 4:35 PM

## 2022-12-25 ENCOUNTER — Other Ambulatory Visit (HOSPITAL_COMMUNITY): Payer: Self-pay

## 2022-12-29 ENCOUNTER — Ambulatory Visit: Payer: Commercial Managed Care - PPO | Admitting: Podiatry

## 2022-12-29 DIAGNOSIS — L603 Nail dystrophy: Secondary | ICD-10-CM

## 2022-12-29 NOTE — Progress Notes (Signed)
Subjective:  Patient ID: [name], female    DOB: 01-Sep-1960,  MRN: [id number]  No chief complaint on file.   62 y.o. female presents with the above complaint.  Patient presents for follow-up of left hallux nail dystrophy.  She states doing a lot better.  No further pain denies any other acute complaints   Review of Systems: Negative except as noted in the HPI. Denies N/V/F/Ch.  Past Medical History:  Diagnosis Date   Anemia when she was young   Arthritis    Congestion of nasal sinus    Constipation    takes Colace every other day   DDD (degenerative disc disease), lumbar    Dependent edema    Diabetes mellitus without complication (HCC)    per pt had been diagnosed but then "number started going down so my doctor said i didnt have diabetes"   Dry eyes    uses Systane Eye drops daily as needed   GERD (gastroesophageal reflux disease) 12/06/2011   takes Nexium daily   Headache(784.0)    occasionally-d/t congestion   Heart murmur    History of bronchitis 6-7 yrs ago   Hyperlipidemia    Hypertension    takes Metoprolol and Diovan daily   Hypothyroidism 12/06/2011   takes Synthroid daily   Joint pain    Multiple allergies    takes Zyrtec daily;uses Nasonex daily as needed   Nocturia    Numbness    weakness-right hand   Onychomycosis    OSA on CPAP    Pancreatitis    Peripheral edema    takes Furosemide daily   Shingles    Urinary frequency    Venous insufficiency of leg     Current Outpatient Medications:    albuterol (PROVENTIL HFA;VENTOLIN HFA) 108 (90 BASE) MCG/ACT inhaler, Inhale 1-2 puffs into the lungs every 6 (six) hours as needed for wheezing or shortness of breath., Disp: 1 Inhaler, Rfl: 2   aspirin EC 81 MG tablet, Take 81 mg by mouth daily. , Disp: , Rfl:    azelastine (ASTELIN) 0.1 % nasal spray, PLACE 1 SPRAY PER NOSTRIL ONCE DAILY AT NIGHT, Disp: 30 mL, Rfl: 4   baclofen (LIORESAL) 10 MG tablet, Take 0.5-1 tablets (5-10 mg total) by mouth every  8 (eight) hours as needed for muscle spasms, Disp: 60 tablet, Rfl: 1   celecoxib (CELEBREX) 200 MG capsule, Take 1 capsule (200 mg total) by mouth 2 (two) times daily., Disp: 60 capsule, Rfl: 3   clobetasol ointment (TEMOVATE) 0.05 %, Apply as directed twice daily for 2 weeks, Disp: 30 g, Rfl: 0   diazepam (VALIUM) 5 MG tablet, Take 1 tablet by mouth once daily as needed for neck pain, Disp: 30 tablet, Rfl: 3   donepezil (ARICEPT) 5 MG tablet, Take 1 tablet (5 mg total) by mouth at bedtime., Disp: 30 tablet, Rfl: 5   esomeprazole (NEXIUM) 40 MG capsule, Take 1 capsule (40 mg total) by mouth daily., Disp: 90 capsule, Rfl: 3   esomeprazole (NEXIUM) 40 MG capsule, Take 1 capsule (40 mg total) by mouth daily., Disp: 90 capsule, Rfl: 3   fluticasone (FLONASE) 50 MCG/ACT nasal spray, PLACE 2 SPRAYS IN EACH NOSTRIL DAILY AS DIRECTED, Disp: 16 g, Rfl: 3   fluticasone (FLONASE) 50 MCG/ACT nasal spray, Place 1 spray in each nostril once a day, Disp: 48 g, Rfl: 4   furosemide (LASIX) 20 MG tablet, Take 3 tablets (60 mg total) by mouth as needed. NEED OV., Disp: 90 tablet,  Rfl: 3   gabapentin (NEURONTIN) 300 MG capsule, Take 1 capsule (300 mg total) by mouth 3 (three) times daily., Disp: 270 capsule, Rfl: 1   levothyroxine (SYNTHROID) 75 MCG tablet, TAKE 1 TABLET BY MOUTH EVERY MORNING ON AN EMPTY STOMACH, Disp: 90 tablet, Rfl: 2   linaclotide (LINZESS) 72 MCG capsule, Take 72 mcg by mouth daily as needed., Disp: , Rfl:    loratadine (CLARITIN) 10 MG tablet, Take 10 mg by mouth daily., Disp: , Rfl:    metoprolol tartrate (LOPRESSOR) 25 MG tablet, Take 1 tablet (25 mg total) by mouth 2 (two) times daily., Disp: 180 tablet, Rfl: 1   nystatin cream (MYCOSTATIN), Apply to affected area 2 times a day for up to 7 days., Disp: 30 g, Rfl: 1   olmesartan (BENICAR) 20 MG tablet, Take 1 tablet (20 mg total) by mouth daily., Disp: 90 tablet, Rfl: 3   Omega-3 Fatty Acids (FISH OIL) 1000 MG CAPS, Take 1,000 mg by mouth  daily., Disp: , Rfl:    OVER THE COUNTER MEDICATION, Gets from [place] Name: Silijit (for memory), Disp: , Rfl:    potassium chloride SA (K-DUR) 20 MEQ tablet, TAKE 1 TABLET (20 MEQ TOTAL) BY MOUTH DAILY., Disp: 90 tablet, Rfl: 1   TURMERIC CURCUMIN PO, Take by mouth daily., Disp: , Rfl:    vitamin C (ASCORBIC ACID) 500 MG tablet, Take 500 mg by mouth daily., Disp: , Rfl:    VITAMIN D PO, Take 1 tablet by mouth daily at 6 (six) AM., Disp: , Rfl:   Social History   Tobacco Use  Smoking Status Former   Current packs/day: 0.00   Types: Cigarettes   Quit date: 1993   Years since quitting: 31.9  Smokeless Tobacco Never  Tobacco Comments   quit smoking in 1993    Allergies  Allergen Reactions   Metronidazole     Developed pancreatitis after taking this   Crestor [Rosuvastatin]    Tramadol Hcl     Ultram- eye edema and arm rash    Adhesive [Tape] Itching and Rash   Latex Itching and Rash   Penicillins Rash    Has patient had a PCN reaction causing immediate rash, facial/tongue/throat swelling, SOB or lightheadedness with hypotension: No Has patient had a PCN reaction causing severe rash involving mucus membranes or skin necrosis: No Has patient had a PCN reaction that required hospitalization No Has patient had a PCN reaction occurring within the last 10 years: No If all of the above answers are "NO", then may proceed with Cephalosporin use.   Vesicare [Solifenacin] Rash   Objective:  There were no vitals filed for this visit. There is no height or weight on file to calculate BMI. Constitutional Well developed. Well nourished.  Vascular Dorsalis pedis pulses palpable bilaterally. Posterior tibial pulses palpable bilaterally. Capillary refill normal to all digits.  No cyanosis or clubbing noted. Pedal hair growth normal.  Neurologic Normal speech. Oriented to person, place, and time. Epicritic sensation to light touch grossly present bilaterally.  Dermatologic Nails  thickened elongated dystrophic mycotic nail left hallux no infection noted no purulent drainage noted no redness noted Skin within normal limits  Orthopedic: Normal joint ROM without pain or crepitus bilaterally. No visible deformities. No bony tenderness.   Radiographs: None Assessment:   No diagnosis found.  Plan:  Patient was evaluated and treated and all questions answered.  Left hallux nail dystrophy -All questions and concerns were discussed with the patient in extensive detail clinically doing  much better has completely healed.  She states is not causing any pain.  At this time if any foot and ankle issues arise future she will come back and see me.  No follow-ups on file.

## 2023-01-01 ENCOUNTER — Other Ambulatory Visit (HOSPITAL_COMMUNITY): Payer: Self-pay

## 2023-01-03 ENCOUNTER — Other Ambulatory Visit: Payer: Self-pay

## 2023-01-03 ENCOUNTER — Other Ambulatory Visit (HOSPITAL_COMMUNITY): Payer: Self-pay

## 2023-01-13 ENCOUNTER — Encounter: Payer: Self-pay | Admitting: Neurology

## 2023-01-16 DIAGNOSIS — G4733 Obstructive sleep apnea (adult) (pediatric): Secondary | ICD-10-CM | POA: Diagnosis not present

## 2023-02-08 ENCOUNTER — Ambulatory Visit: Payer: Commercial Managed Care - PPO | Admitting: Orthopaedic Surgery

## 2023-02-08 VITALS — BP 108/71 | HR 73

## 2023-02-08 DIAGNOSIS — M170 Bilateral primary osteoarthritis of knee: Secondary | ICD-10-CM

## 2023-02-08 MED ORDER — BUPIVACAINE HCL 0.25 % IJ SOLN
4.0000 mL | INTRAMUSCULAR | Status: AC | PRN
Start: 2023-02-08 — End: 2023-02-08
  Administered 2023-02-08: 4 mL via INTRA_ARTICULAR

## 2023-02-08 MED ORDER — METHYLPREDNISOLONE ACETATE 40 MG/ML IJ SUSP
40.0000 mg | INTRAMUSCULAR | Status: AC | PRN
  Administered 2023-02-08: 40 mg via INTRA_ARTICULAR

## 2023-02-08 MED ORDER — LIDOCAINE HCL 1 % IJ SOLN
0.5000 mL | INTRAMUSCULAR | Status: AC | PRN
  Administered 2023-02-08: .5 mL

## 2023-02-08 MED ORDER — BUPIVACAINE HCL 0.5 % IJ SOLN
3.0000 mL | INTRAMUSCULAR | Status: AC | PRN
Start: 2023-02-08 — End: 2023-02-08
  Administered 2023-02-08: 3 mL via INTRA_ARTICULAR

## 2023-02-08 NOTE — Progress Notes (Signed)
 Office Visit Note   Patient: [name]           Date of Birth: March 18, 1960           MRN: [id number] Visit Date: 02/08/2023              Requested by: Larnell Hamilton, MD [address] [place],  [place] [address] PCP: Larnell Hamilton, MD   Assessment & Plan: Visit Diagnoses:  1. Bilateral primary osteoarthritis of knee     Plan: Bilateral knee injections performed.  She can follow-up as needed.  Follow-Up Instructions: No follow-ups on file.   Orders:  Orders Placed This Encounter  Procedures   Large Joint Inj   Large Joint Inj   No orders of the defined types were placed in this encounter.     Procedures: Large Joint Inj: R knee on 02/08/2023 11:00 AM Indications: pain and joint swelling Details: 22 G 1.5 in needle, anterolateral approach  Arthrogram: No  Medications: 40 mg methylPREDNISolone  acetate 40 MG/ML; 0.5 mL lidocaine  1 %; 4 mL bupivacaine  0.25 % Outcome: tolerated well, no immediate complications Procedure, treatment alternatives, risks and benefits explained, specific risks discussed. Consent was given by the patient. Immediately prior to procedure a time out was called to verify the correct patient, procedure, equipment, support staff and site/side marked as required. Patient was prepped and draped in the usual sterile fashion.    Large Joint Inj: L knee on 02/08/2023 11:00 AM Indications: joint swelling and pain Details: 22 G 1.5 in needle, anterolateral approach  Arthrogram: No  Medications: 0.5 mL lidocaine  1 %; 3 mL bupivacaine  0.5 %; 40 mg methylPREDNISolone  acetate 40 MG/ML Outcome: tolerated well, no immediate complications Procedure, treatment alternatives, risks and benefits explained, specific risks discussed. Consent was given by the patient. Immediately prior to procedure a time out was called to verify the correct patient, procedure, equipment, support staff and site/side marked as required. Patient was prepped and draped in the usual  sterile fashion.       Clinical Data: No additional findings.   Subjective: Chief Complaint  Patient presents with   Right Knee - Pain   Left Knee - Pain    HPI 63 year old female with bilateral knee osteoarthritis bone-on-bone and mild varus.  Previous injection July she states now in January with cold weather she has had more difficulty walking is requesting repeat injections.  We have discussed total knee arthroplasty and she is trying to put it off as long as possible.  Review of Systems positive for elevated BMI she has been on the borderline close to 40 prediabetes.  Past history of BMI greater than 50.   Objective: Vital Signs: BP 108/71   Pulse 73   Physical Exam Constitutional:      Appearance: She is well-developed.  HENT:     Head: Normocephalic.     Right Ear: External ear normal.     Left Ear: External ear normal. There is no impacted cerumen.  Eyes:     Pupils: Pupils are equal, round, and reactive to light.  Neck:     Thyroid : No thyromegaly.     Trachea: No tracheal deviation.  Cardiovascular:     Rate and Rhythm: Normal rate.  Pulmonary:     Effort: Pulmonary effort is normal.  Abdominal:     Palpations: Abdomen is soft.  Musculoskeletal:     Cervical back: No rigidity.  Skin:    General: Skin is warm and dry.  Neurological:  Mental Status: She is alert and oriented to person, place, and time.  Psychiatric:        Behavior: Behavior normal.     Ortho Exam bilateral knee crepitus joint line tenderness.  Specialty Comments:  No specialty comments available.  Imaging: No results found.   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 08/23/2016   Prediabetes 12/03/2015   OSA on CPAP 02/19/2015   Lung field abnormal 11/11/2014   Morbid obesity (HCC) 10/09/2014   Constipation 06/14/2014   Peripheral venous insufficiency 05/25/2013   Pulmonary HTN (HCC) 05/12/2013   Bilateral lower extremity edema 05/12/2013   Morbid obesity with BMI of 50.0-59.9, adult (HCC)    Shoulder joint pain 03/28/2013   Abnormal glucose level 02/20/2013   Alopecia 02/20/2013   Degeneration of lumbar intervertebral disc 02/20/2013   Hyperglycemia due to type 2 diabetes mellitus (HCC) 02/20/2013   Type 2 diabetes mellitus with other specified complication (HCC) 02/20/2013   Back pain, hx of prior back surgery 12/09/2011   Pancreatitis, mild, CT negative 12/08/2011   Fatty liver 12/08/2011   Substernal chest pain, suspected secondary to acute pancreatitis 12/06/2011   Hypertension 12/06/2011   Hypokalemia 12/06/2011   Hypothyroidism 12/06/2011   Anxiety 12/06/2011   BV (bacterial vaginosis)    Hyperlipidemia    Past Medical History:  Diagnosis Date   Anemia when she was young   Arthritis    Congestion of nasal sinus    Constipation    takes Colace every other day   DDD (degenerative disc disease), lumbar    Dependent edema    Diabetes mellitus without complication (HCC)    per pt had been diagnosed but then number started going down so my doctor said i didnt have diabetes   Dry eyes    uses Systane Eye drops daily as needed   GERD (gastroesophageal reflux disease) 12/06/2011   takes Nexium  daily   Headache(784.0)    occasionally-d/t congestion   Heart murmur    History of bronchitis 6-7 yrs ago   Hyperlipidemia    Hypertension    takes Metoprolol  and Diovan  daily   Hypothyroidism 12/06/2011   takes Synthroid  daily   Joint pain    Multiple allergies    takes Zyrtec daily;uses Nasonex daily as needed    Nocturia    Numbness    weakness-right hand   Onychomycosis    OSA on CPAP    Pancreatitis    Peripheral edema    takes Furosemide  daily   Shingles    Urinary frequency    Venous insufficiency of leg     Family History  Problem Relation Age of Onset   Hypertension Mother    Dementia Mother    Alzheimer's disease Mother    Hypertension Father    Hypertension Sister    Kidney disease Sister        KIDNEL TRANSPLANT   Thyroid  disease Sister    Thyroid  disease Sister    Hypertension Sister    Breast cancer Maternal Aunt        >50; passed away from it   Breast  cancer Maternal Aunt        >50    Past Surgical History:  Procedure Laterality Date   ABDOMINAL HYSTERECTOMY     uterine prolapse   BACK SURGERY     L4-5   CARDIAC SURGERY     CATARACT EXTRACTION, BILATERAL  2023   CHOLECYSTECTOMY     COLONOSCOPY     ESOPHAGOGASTRODUODENOSCOPY  5-63yrs ago   FOOT SURGERY Bilateral    KNEE ARTHROSCOPY     LAPAROSCOPIC GASTRIC SLEEVE RESECTION N/A 08/23/2016   Procedure: LAPAROSCOPIC GASTRIC SLEEVE RESECTION WITH UPPER ENDO;  Surgeon: Gladis Cough, MD;  Location: [facility];  Service: General;  Laterality: N/A;   POSTERIOR LUMBAR FUSION  03/10/2009   L3-4 and L3-4 fusion, Dr. Catalina Stains   SHOULDER ARTHROSCOPY WITH ROTATOR CUFF REPAIR AND SUBACROMIAL DECOMPRESSION Right 08/23/2013   Procedure: RIGHT SHOULDER ARTHROSCOPY WITH  SUBACROMIAL DECOMPRESSION DISTAL CLAVICLE RESECTION AND  ROTATOR CUFF REPAIR ;  Surgeon: Franky CHRISTELLA Pointer, MD;  Location: [facility];  Service: Orthopedics;  Laterality: Right;   TUBAL LIGATION     Social History   Occupational History    Employer: [employer]    Comment: [employer]  Tobacco Use   Smoking status: Former    Current packs/day: 0.00    Types: Cigarettes    Quit date: 1993    Years since quitting: 32.0   Smokeless tobacco: Never   Tobacco comments:    quit smoking in 1993  Vaping Use   Vaping status: Never Used  Substance and Sexual Activity    Alcohol  use: Yes    Alcohol /week: 0.0 standard drinks of alcohol     Comment: rarely;; mixed drink    Drug use: No   Sexual activity: Not Currently    Partners: Male    Birth control/protection: Surgical    Comment: Hysterectomy

## 2023-02-10 ENCOUNTER — Other Ambulatory Visit (HOSPITAL_COMMUNITY): Payer: Self-pay

## 2023-03-05 ENCOUNTER — Other Ambulatory Visit: Payer: Self-pay | Admitting: Cardiovascular Disease

## 2023-03-06 ENCOUNTER — Other Ambulatory Visit: Payer: Self-pay

## 2023-03-07 ENCOUNTER — Other Ambulatory Visit (HOSPITAL_COMMUNITY): Payer: Self-pay

## 2023-03-07 MED ORDER — METOPROLOL TARTRATE 25 MG PO TABS
25.0000 mg | ORAL_TABLET | Freq: Two times a day (BID) | ORAL | 1 refills | Status: AC
  Filled 2023-03-07 – 2023-05-18 (×3): qty 180, 90d supply, fill #0
  Filled 2023-08-11: qty 180, 90d supply, fill #1

## 2023-03-09 ENCOUNTER — Other Ambulatory Visit (HOSPITAL_COMMUNITY): Payer: Self-pay

## 2023-03-16 ENCOUNTER — Other Ambulatory Visit (HOSPITAL_COMMUNITY): Payer: Self-pay

## 2023-03-17 ENCOUNTER — Other Ambulatory Visit (HOSPITAL_COMMUNITY): Payer: Self-pay

## 2023-04-01 ENCOUNTER — Other Ambulatory Visit (HOSPITAL_COMMUNITY): Payer: Self-pay

## 2023-04-01 MED ORDER — NYSTATIN 100000 UNIT/GM EX POWD
1.0000 | Freq: Two times a day (BID) | CUTANEOUS | 1 refills | Status: DC
  Filled 2023-04-01: qty 30, 15d supply, fill #0
  Filled 2023-05-18: qty 30, 15d supply, fill #1

## 2023-04-04 ENCOUNTER — Other Ambulatory Visit (HOSPITAL_COMMUNITY): Payer: Self-pay

## 2023-04-20 ENCOUNTER — Encounter (HOSPITAL_BASED_OUTPATIENT_CLINIC_OR_DEPARTMENT_OTHER): Payer: Self-pay | Admitting: Student

## 2023-04-20 ENCOUNTER — Ambulatory Visit (HOSPITAL_BASED_OUTPATIENT_CLINIC_OR_DEPARTMENT_OTHER): Admitting: Student

## 2023-04-20 DIAGNOSIS — M170 Bilateral primary osteoarthritis of knee: Secondary | ICD-10-CM

## 2023-04-20 NOTE — Progress Notes (Signed)
 Chief Complaint: Bilateral knee pain     History of Present Illness:    [name] is a 63 y.o. female presenting today for evaluation of bilateral knee pain right worse than left.  She does have known bilateral knee osteoarthritis most prominent in the medial compartments and has been managing with cortisone injections.  Her last round of injections was on 02/08/2023 with Dr. Ophelia Charter and states that this gave her about 2 months of relief, however has been recently wearing off.  Her right knee is severely painful and has had some episodes of buckling.  She is wanting to pursue discussion of total knee replacement.   Surgical History:   None  PMH/PSH/Family History/Social History/Meds/Allergies:    Past Medical History:  Diagnosis Date   Anemia when she was young   Arthritis    Congestion of nasal sinus    Constipation    takes Colace every other day   DDD (degenerative disc disease), lumbar    Dependent edema    Diabetes mellitus without complication (HCC)    per pt had been diagnosed but then "number started going down so my doctor said i didnt have diabetes"   Dry eyes    uses Systane Eye drops daily as needed   GERD (gastroesophageal reflux disease) 12/06/2011   takes Nexium daily   Headache(784.0)    occasionally-d/t congestion   Heart murmur    History of bronchitis 6-7 yrs ago   Hyperlipidemia    Hypertension    takes Metoprolol and Diovan daily   Hypothyroidism 12/06/2011   takes Synthroid daily   Joint pain    Multiple allergies    takes Zyrtec daily;uses Nasonex daily as needed   Nocturia    Numbness    weakness-right hand   Onychomycosis    OSA on CPAP    Pancreatitis    Peripheral edema    takes Furosemide daily   Shingles    Urinary frequency    Venous insufficiency of leg    Past Surgical History:  Procedure Laterality Date   ABDOMINAL HYSTERECTOMY     uterine prolapse   BACK SURGERY     L4-5   CARDIAC  SURGERY     CATARACT EXTRACTION, BILATERAL  2023   CHOLECYSTECTOMY     COLONOSCOPY     ESOPHAGOGASTRODUODENOSCOPY  5-37yrs ago   FOOT SURGERY Bilateral    KNEE ARTHROSCOPY     LAPAROSCOPIC GASTRIC SLEEVE RESECTION N/A 08/23/2016   Procedure: LAPAROSCOPIC GASTRIC SLEEVE RESECTION WITH UPPER ENDO;  Surgeon: Luretha Murphy, MD;  Location: [facility];  Service: General;  Laterality: N/A;   POSTERIOR LUMBAR FUSION  03/10/2009   L3-4 and L3-4 fusion, Dr. Hilda Lias   SHOULDER ARTHROSCOPY WITH ROTATOR CUFF REPAIR AND SUBACROMIAL DECOMPRESSION Right 08/23/2013   Procedure: RIGHT SHOULDER ARTHROSCOPY WITH  SUBACROMIAL DECOMPRESSION DISTAL CLAVICLE RESECTION AND  ROTATOR CUFF REPAIR ;  Surgeon: Senaida Lange, MD;  Location: [facility];  Service: Orthopedics;  Laterality: Right;   TUBAL LIGATION     Social History   Socioeconomic History   Marital status: Divorced    Spouse name: Not on file   Number of children: 2   Years of education: College   Highest education level: Not on file  Occupational History    Employer: [employer]  Comment: [employer]  Tobacco Use   Smoking status: Former    Current packs/day: 0.00    Types: Cigarettes    Quit date: 1993    Years since quitting: 32.2   Smokeless tobacco: Never   Tobacco comments:    quit smoking in 1993  Vaping Use   Vaping status: Never Used  Substance and Sexual Activity   Alcohol use: Yes    Alcohol/week: 0.0 standard drinks of alcohol    Comment: rarely;; mixed drink    Drug use: No   Sexual activity: Not Currently    Partners: Male    Birth control/protection: Surgical    Comment: Hysterectomy  Other Topics Concern   Not on file  Social History Narrative   Caffeine: Coffee   Social Drivers of Health   Financial Resource Strain: Not on file  Food Insecurity: Not on file  Transportation Needs: Not on file  Physical Activity: Not on file  Stress: Not on file  Social Connections: Unknown (09/11/2021)   Received from [facility] [employer]   Social Network    Social Network: Not on file   Family History  Problem Relation Age of Onset   Hypertension Mother    Dementia Mother    Alzheimer's disease Mother    Hypertension Father    Hypertension Sister    Kidney disease Sister        KIDNEL TRANSPLANT   Thyroid disease Sister    Thyroid disease Sister    Hypertension Sister    Breast cancer Maternal Aunt        >50; passed away from it   Breast cancer Maternal Aunt        >50   Allergies  Allergen Reactions   Metronidazole     Developed pancreatitis after taking this   Crestor [Rosuvastatin]    Tramadol Hcl     Ultram- eye edema and arm rash    Adhesive [Tape] Itching and Rash   Latex Itching and Rash   Penicillins Rash    Has patient had a PCN reaction causing immediate rash, facial/tongue/throat swelling, SOB or lightheadedness with hypotension: No Has patient had a PCN reaction causing severe rash involving mucus membranes or skin necrosis: No Has patient had a PCN reaction that required hospitalization No Has patient had a PCN reaction occurring within the last 10 years: No If all of the above answers are "NO", then may proceed with Cephalosporin use.   Vesicare [Solifenacin] Rash   Current Outpatient Medications  Medication Sig Dispense Refill   albuterol (PROVENTIL HFA;VENTOLIN HFA) 108 (90 BASE) MCG/ACT inhaler Inhale 1-2 puffs into the lungs every 6 (six) hours as needed for wheezing or shortness of breath. 1 Inhaler 2   aspirin EC 81 MG tablet Take 81 mg by mouth daily.      azelastine (ASTELIN) 0.1 % nasal spray PLACE 1 SPRAY PER NOSTRIL ONCE DAILY AT NIGHT 30 mL 4   baclofen (LIORESAL) 10 MG tablet Take 0.5-1 tablets (5-10 mg total) by mouth every 8 (eight) hours as needed for muscle spasms 60 tablet 1   celecoxib (CELEBREX) 200 MG capsule Take 1 capsule (200 mg total) by mouth 2 (two) times daily. 60 capsule 3   clobetasol ointment (TEMOVATE) 0.05 % Apply as directed twice  daily for 2 weeks 30 g 0   diazepam (VALIUM) 5 MG tablet Take 1 tablet by mouth once daily as needed for neck pain 30 tablet 3   donepezil (ARICEPT) 5 MG tablet  Take 1 tablet (5 mg total) by mouth at bedtime. 30 tablet 5   esomeprazole (NEXIUM) 40 MG capsule Take 1 capsule (40 mg total) by mouth daily. 90 capsule 3   esomeprazole (NEXIUM) 40 MG capsule Take 1 capsule (40 mg total) by mouth daily. 90 capsule 3   fluticasone (FLONASE) 50 MCG/ACT nasal spray PLACE 2 SPRAYS IN EACH NOSTRIL DAILY AS DIRECTED 16 g 3   fluticasone (FLONASE) 50 MCG/ACT nasal spray Place 1 spray in each nostril once a day 48 g 4   furosemide (LASIX) 20 MG tablet Take 3 tablets (60 mg total) by mouth as needed. NEED OV. 90 tablet 3   gabapentin (NEURONTIN) 300 MG capsule Take 1 capsule (300 mg total) by mouth 3 (three) times daily. 270 capsule 1   levothyroxine (SYNTHROID) 75 MCG tablet TAKE 1 TABLET BY MOUTH EVERY MORNING ON AN EMPTY STOMACH 90 tablet 2   linaclotide (LINZESS) 72 MCG capsule Take 72 mcg by mouth daily as needed.     loratadine (CLARITIN) 10 MG tablet Take 10 mg by mouth daily.     metoprolol tartrate (LOPRESSOR) 25 MG tablet Take 1 tablet (25 mg total) by mouth 2 (two) times daily. 180 tablet 1   nystatin (MYCOSTATIN/NYSTOP) powder Apply 1 Application topically 2 (two) times daily. 30 g 1   nystatin cream (MYCOSTATIN) Apply to affected area 2 times a day for up to 7 days. 30 g 1   olmesartan (BENICAR) 20 MG tablet Take 1 tablet (20 mg total) by mouth daily. 90 tablet 3   Omega-3 Fatty Acids (FISH OIL) 1000 MG CAPS Take 1,000 mg by mouth daily.     OVER THE COUNTER MEDICATION Gets from [place] Name: Silijit (for memory)     potassium chloride SA (K-DUR) 20 MEQ tablet TAKE 1 TABLET (20 MEQ TOTAL) BY MOUTH DAILY. 90 tablet 1   TURMERIC CURCUMIN PO Take by mouth daily.     vitamin C (ASCORBIC ACID) 500 MG tablet Take 500 mg by mouth daily.     VITAMIN D PO Take 1 tablet by mouth daily at 6 (six) AM.     No  current facility-administered medications for this visit.   No results found.  Review of Systems:   A ROS was performed including pertinent positives and negatives as documented in the HPI.  Physical Exam :   Constitutional: NAD and appears stated age Neurological: Alert and oriented Psych: Appropriate affect and cooperative There were no vitals taken for this visit.   Comprehensive Musculoskeletal Exam:    Bilateral knee exam demonstrates tenderness over the medial joint lines.  Active range of motion from 0 to 100 degrees with palpable crepitus.  No instability with varus or valgus stress.  No overlying erythema or warmth with presence of mild effusions.  Imaging:      Assessment:   63 y.o. female with advanced bilateral knee osteoarthritis.  Symptoms are worse within the right knee and she has been managing with cortisone injections although last injection only provided approximately 2 months of relief.  She is approximately 2 weeks shy of 3 months from last injections, however I am agreeable to repeating this today as she is going to pursue total knee replacements and unfortunately just recently lost her mother whose funeral is this upcoming weekend.  I will plan to send her over to Dr. Roda Shutters for TKA discussion.  Plan :    -Bilateral knee cortisone injections performed today -Referral to Dr. Roda Shutters for further discussion  of total knee replacement     I personally saw and evaluated the patient, and participated in the management and treatment plan.  Hazle Nordmann, PA-C Orthopedics

## 2023-04-26 ENCOUNTER — Encounter: Admitting: Orthopaedic Surgery

## 2023-05-03 ENCOUNTER — Other Ambulatory Visit: Payer: Self-pay

## 2023-05-03 ENCOUNTER — Ambulatory Visit (INDEPENDENT_AMBULATORY_CARE_PROVIDER_SITE_OTHER): Admitting: Orthopaedic Surgery

## 2023-05-03 ENCOUNTER — Other Ambulatory Visit (HOSPITAL_COMMUNITY): Payer: Self-pay

## 2023-05-03 ENCOUNTER — Other Ambulatory Visit (INDEPENDENT_AMBULATORY_CARE_PROVIDER_SITE_OTHER): Payer: Self-pay

## 2023-05-03 ENCOUNTER — Encounter: Payer: Self-pay | Admitting: Orthopaedic Surgery

## 2023-05-03 VITALS — Ht 63.0 in | Wt 210.0 lb

## 2023-05-03 DIAGNOSIS — G8929 Other chronic pain: Secondary | ICD-10-CM | POA: Diagnosis not present

## 2023-05-03 DIAGNOSIS — M170 Bilateral primary osteoarthritis of knee: Secondary | ICD-10-CM

## 2023-05-03 DIAGNOSIS — M1711 Unilateral primary osteoarthritis, right knee: Secondary | ICD-10-CM | POA: Insufficient documentation

## 2023-05-03 DIAGNOSIS — M1712 Unilateral primary osteoarthritis, left knee: Secondary | ICD-10-CM | POA: Insufficient documentation

## 2023-05-03 DIAGNOSIS — M25561 Pain in right knee: Secondary | ICD-10-CM

## 2023-05-03 DIAGNOSIS — M25562 Pain in left knee: Secondary | ICD-10-CM | POA: Diagnosis not present

## 2023-05-03 MED ORDER — MELOXICAM 15 MG PO TABS
15.0000 mg | ORAL_TABLET | Freq: Every day | ORAL | 3 refills | Status: DC
  Filled 2023-05-03: qty 90, 90d supply, fill #0

## 2023-05-03 MED ORDER — OLMESARTAN MEDOXOMIL 20 MG PO TABS
20.0000 mg | ORAL_TABLET | Freq: Every day | ORAL | 3 refills | Status: AC
  Filled 2023-05-03 – 2023-05-18 (×2): qty 90, 90d supply, fill #0
  Filled 2023-08-11: qty 90, 90d supply, fill #1

## 2023-05-03 MED ORDER — ESOMEPRAZOLE MAGNESIUM 40 MG PO CPDR
40.0000 mg | DELAYED_RELEASE_CAPSULE | Freq: Every day | ORAL | 3 refills | Status: AC
  Filled 2023-05-03 – 2023-05-18 (×2): qty 90, 90d supply, fill #0
  Filled 2023-08-11: qty 90, 90d supply, fill #1

## 2023-05-03 NOTE — Progress Notes (Signed)
 Office Visit Note   Patient: [name]           Date of Birth: 1960/10/21           MRN: [id number] Visit Date: 05/03/2023              Requested by: Amador Cunas, PA-C [address] [place],  [place] [address] PCP: Alysia Penna, MD   Assessment & Plan: Visit Diagnoses:  1. Primary osteoarthritis of right knee   2. Primary osteoarthritis of left knee     Plan: [name] is a very pleasant 63 year old female with end-stage bilateral knee DJD.  Based on her options she would like to make arrangements to schedule right total knee arthroplasty sometime in the summer.  Eunice Blase will call her once we have the necessary clearances.  Impression is severe right knee degenerative joint disease secondary to Osteoarthritis.  Bone on bone joint space narrowing is seen on radiographs with mild varus alignment.  At this point, conservative treatments fail to provide any significant relief and the pain is severely affecting ADLs and quality of life.  Based on treatment options, the patient has elected to move forward with a knee replacement.  We have discussed the surgical risks that include but are not limited to infection, DVT, leg length discrepancy, stiffness, numbness, tingling, incomplete relief of pain.  Recovery and prognosis were also reviewed.    Anticoagulants: Aspirin 81 Postop anticoagulation: Eliquis Diabetic: No  Nickel allergy: No Prior DVT/PE: No Tobacco use: No Clearances needed for surgery:  PCP Anticipated discharge dispo: Home  Continue conservative management for the left knee.  Follow-Up Instructions: No follow-ups on file.   Orders:  Orders Placed This Encounter  Procedures   XR KNEE 3 VIEW LEFT   XR KNEE 3 VIEW RIGHT   No orders of the defined types were placed in this encounter.     Procedures: No procedures performed   Clinical Data: No additional findings.   Subjective: Chief Complaint  Patient presents with   Right Knee -  Pain   Left Knee - Pain    HPI [name] is a very pleasant 63 year old female here for surgical consultation of bilateral knee pain worse on the right.  She is a longtime patient of Dr. Ophelia Charter who has retired.  Recently underwent cortisone injections last week with Hazle Nordmann.  She states that she is ready to move forward with knee replacement surgery as conservative treatments only provide temporary relief.  Review of Systems   Objective: Vital Signs: Ht 5\' 3"  (1.6 m)   Wt 210 lb (95.3 kg)   BMI 37.20 kg/m   Physical Exam  Ortho Exam Examination of bilateral knees show varus deformity.  Medial joint line tenderness.  Close and cruciates are stable.  Range of motion at baseline. Specialty Comments:  No specialty comments available.  Imaging: XR KNEE 3 VIEW RIGHT Result Date: 05/03/2023 X-rays demonstrate severe osteoarthritis.  Bone-on-bone joint space narrowing with varus deformity  XR KNEE 3 VIEW LEFT Result Date: 05/03/2023 X-rays of the left knee show advanced tricompartmental degenerative joint disease.  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 08/23/2016   Prediabetes 12/03/2015   OSA on CPAP 02/19/2015   Lung field abnormal 11/11/2014   Morbid obesity (HCC) 10/09/2014   Constipation 06/14/2014   Peripheral venous insufficiency 05/25/2013   Pulmonary HTN (HCC) 05/12/2013   Bilateral  lower extremity edema 05/12/2013   Morbid obesity with BMI of 50.0-59.9, adult (HCC)    Shoulder joint pain 03/28/2013   Abnormal glucose level 02/20/2013   Alopecia 02/20/2013   Degeneration of lumbar intervertebral disc 02/20/2013   Hyperglycemia due to type 2 diabetes mellitus (HCC) 02/20/2013   Type 2 diabetes mellitus with other specified complication (HCC) 02/20/2013   Back pain, hx of prior back surgery 12/09/2011   Pancreatitis, mild, CT negative 12/08/2011   Fatty liver 12/08/2011   Substernal chest pain, suspected secondary to acute pancreatitis 12/06/2011   Hypertension 12/06/2011   Hypokalemia 12/06/2011   Hypothyroidism 12/06/2011   Anxiety 12/06/2011   BV (bacterial vaginosis)    Hyperlipidemia    Past Medical History:  Diagnosis Date   Anemia when she was young   Arthritis    Congestion of nasal sinus    Constipation    takes Colace every other day   DDD (degenerative disc disease), lumbar    Dependent edema    Diabetes mellitus without complication (HCC)    per pt had been diagnosed but then "number started going down so my doctor said i didnt have diabetes"   Dry eyes    uses Systane Eye drops daily as needed   GERD (gastroesophageal reflux disease) 12/06/2011   takes Nexium daily   Headache(784.0)    occasionally-d/t congestion   Heart murmur    History of bronchitis 6-7 yrs ago   Hyperlipidemia    Hypertension    takes Metoprolol and Diovan daily   Hypothyroidism 12/06/2011   takes Synthroid daily   Joint pain    Multiple allergies    takes Zyrtec daily;uses Nasonex daily as needed   Nocturia    Numbness    weakness-right hand   Onychomycosis    OSA on CPAP    Pancreatitis    Peripheral edema    takes Furosemide daily   Shingles    Urinary frequency    Venous insufficiency of leg     Family History  Problem Relation Age of Onset   Hypertension Mother    Dementia Mother    Alzheimer's disease Mother    Hypertension Father     Hypertension Sister    Kidney disease Sister        KIDNEL TRANSPLANT   Thyroid disease Sister    Thyroid disease Sister    Hypertension Sister    Breast cancer Maternal Aunt        >50; passed away from it   Breast cancer Maternal Aunt        >50    Past Surgical History:  Procedure Laterality Date   ABDOMINAL HYSTERECTOMY     uterine prolapse   BACK SURGERY     L4-5   CARDIAC SURGERY     CATARACT EXTRACTION, BILATERAL  2023   CHOLECYSTECTOMY     COLONOSCOPY     ESOPHAGOGASTRODUODENOSCOPY  5-90yrs ago   FOOT SURGERY Bilateral    KNEE ARTHROSCOPY     LAPAROSCOPIC GASTRIC SLEEVE RESECTION N/A 08/23/2016   Procedure:  LAPAROSCOPIC GASTRIC SLEEVE RESECTION WITH UPPER ENDO;  Surgeon: Luretha Murphy, MD;  Location: [facility];  Service: General;  Laterality: N/A;   POSTERIOR LUMBAR FUSION  03/10/2009   L3-4 and L3-4 fusion, Dr. Hilda Lias   SHOULDER ARTHROSCOPY WITH ROTATOR CUFF REPAIR AND SUBACROMIAL DECOMPRESSION Right 08/23/2013   Procedure: RIGHT SHOULDER ARTHROSCOPY WITH  SUBACROMIAL DECOMPRESSION DISTAL CLAVICLE RESECTION AND  ROTATOR CUFF REPAIR ;  Surgeon: Senaida Lange, MD;  Location: [facility];  Service: Orthopedics;  Laterality: Right;   TUBAL LIGATION     Social History   Occupational History    Employer: [employer]    Comment: [employer]  Tobacco Use   Smoking status: Former    Current packs/day: 0.00    Types: Cigarettes    Quit date: 1993    Years since quitting: 32.2   Smokeless tobacco: Never   Tobacco comments:    quit smoking in 1993  Vaping Use   Vaping status: Never Used  Substance and Sexual Activity   Alcohol use: Yes    Alcohol/week: 0.0 standard drinks of alcohol    Comment: rarely;; mixed drink    Drug use: No   Sexual activity: Not Currently    Partners: Male    Birth control/protection: Surgical    Comment: Hysterectomy

## 2023-05-13 ENCOUNTER — Other Ambulatory Visit (HOSPITAL_COMMUNITY): Payer: Self-pay

## 2023-05-18 ENCOUNTER — Other Ambulatory Visit: Payer: Self-pay | Admitting: Physical Medicine and Rehabilitation

## 2023-05-18 ENCOUNTER — Other Ambulatory Visit (HOSPITAL_COMMUNITY): Payer: Self-pay

## 2023-05-18 ENCOUNTER — Other Ambulatory Visit: Payer: Self-pay

## 2023-05-18 MED ORDER — GABAPENTIN 300 MG PO CAPS
300.0000 mg | ORAL_CAPSULE | Freq: Three times a day (TID) | ORAL | 1 refills | Status: DC
  Filled 2023-05-18: qty 270, 90d supply, fill #0
  Filled 2023-08-11: qty 270, 90d supply, fill #1

## 2023-05-19 ENCOUNTER — Other Ambulatory Visit (HOSPITAL_COMMUNITY): Payer: Self-pay

## 2023-06-14 ENCOUNTER — Other Ambulatory Visit: Payer: Self-pay | Admitting: Neurology

## 2023-06-14 ENCOUNTER — Other Ambulatory Visit (HOSPITAL_COMMUNITY): Payer: Self-pay

## 2023-06-14 MED ORDER — DONEPEZIL HCL 5 MG PO TABS
5.0000 mg | ORAL_TABLET | Freq: Every day | ORAL | 5 refills | Status: AC
  Filled 2023-06-14: qty 30, 30d supply, fill #0
  Filled 2023-07-07: qty 30, 30d supply, fill #1
  Filled 2023-08-11: qty 30, 30d supply, fill #2

## 2023-06-14 MED ORDER — NYSTATIN 100000 UNIT/GM EX POWD
1.0000 | Freq: Two times a day (BID) | CUTANEOUS | 1 refills | Status: AC
Start: 2023-06-14 — End: ?
  Filled 2023-06-14: qty 30, 15d supply, fill #0
  Filled 2023-08-11: qty 30, 15d supply, fill #1

## 2023-06-14 NOTE — Telephone Encounter (Signed)
 Dr. Dail Drought Patient

## 2023-06-16 ENCOUNTER — Other Ambulatory Visit (HOSPITAL_COMMUNITY): Payer: Self-pay

## 2023-06-23 ENCOUNTER — Telehealth: Payer: Self-pay | Admitting: Orthopaedic Surgery

## 2023-06-23 NOTE — Telephone Encounter (Signed)
 Left message on patient's voicemail.  Provided my name and direct number for scheduling right total knee arthroplasty with Dr Christiane Cowing.

## 2023-07-07 ENCOUNTER — Other Ambulatory Visit (HOSPITAL_COMMUNITY): Payer: Self-pay

## 2023-07-13 ENCOUNTER — Encounter: Payer: Self-pay | Attending: Psychology | Admitting: Psychology

## 2023-07-13 DIAGNOSIS — G894 Chronic pain syndrome: Secondary | ICD-10-CM

## 2023-07-13 DIAGNOSIS — G4733 Obstructive sleep apnea (adult) (pediatric): Secondary | ICD-10-CM | POA: Diagnosis not present

## 2023-07-13 DIAGNOSIS — R413 Other amnesia: Secondary | ICD-10-CM

## 2023-07-18 ENCOUNTER — Ambulatory Visit

## 2023-07-19 ENCOUNTER — Encounter

## 2023-07-19 DIAGNOSIS — R413 Other amnesia: Secondary | ICD-10-CM | POA: Diagnosis present

## 2023-07-19 NOTE — Progress Notes (Signed)
 Behavioral Observations: The patient appeared well-groomed and appropriately dressed. Her manners were polite and appropriate to the situation. The patient's attitude towards testing was positive and she demonstrated a good effort.   Neuropsychology Note  [name] completed 165 minutes of neuropsychological testing with technician, Josue Ned, BA, under the supervision of Norleen Asa, PsyD., Clinical Neuropsychologist. The patient did not appear overtly distressed by the testing session, per behavioral observation or via self-report to the technician. Rest breaks were offered.   Clinical Decision Making: In considering the patient's current level of functioning, level of presumed impairment, nature of symptoms, emotional and behavioral responses during clinical interview, level of literacy, and observed level of motivation/effort, a battery of tests was selected by Dr. Asa during initial consultation on 07/13/2023. This was communicated to the technician. Communication between the neuropsychologist and technician was ongoing throughout the testing session and changes were made as deemed necessary based on patient performance on testing, technician observations and additional pertinent factors such as those listed above.  Tests Administered: Controlled Oral Word Association Test (COWAT; FAS & Animals)  Grooved Pegboard Wechsler Adult Intelligence Scale, 4th Edition (WAIS-IV) Wechsler Memory Scale, 4th Edition (WMS-IV); Adult Battery [place]  Card Sorting Test [facility]) (Discontinued)  Results:  COWAT:  FAS total= 17 Z= -1.60 Animals total= 8 Z= -2.04   Grooved Pegboard:  R (DH) time= 90s  Percentile Rank=32nd Drops=0 L (NDH) time= 100s  Percentile Rank= 36th  Drops=1  WAIS-IV:   Composite Score Summary  Scale Sum of Scaled Scores Composite Score Percentile Rank 95% Conf. Interval Qualitative Description  Verbal Comprehension 20 VCI 81 10 76-87 Low  Average  Perceptual Reasoning 20 PRI 81 10 76-88 Low Average  Working Memory 10 WMI 71 3 66-80 Borderline  Processing Speed 12 PSI 79 8 73-89 Borderline  Full Scale 62 FSIQ 74 4 70-79 Borderline  General Ability 40 GAI 79 8 75-85 Borderline  Confidence Intervals are based on the Overall Average SEMs. The [facility] is an optional composite summary score that is less sensitive to the influence of working memory and processing speed. Because working memory and processing speed are vital to a comprehensive evaluation of cognitive ability, it should be noted that the [facility] does not have the breadth of construct coverage as the FSIQ.  Verbal Comprehension Subtests Summary  Subtest Raw Score Scaled Score Percentile Rank Reference Group Scaled Score SEM  Similarities 21 8 25 8  1.08  Vocabulary 28 8 25 8  0.73  Information 4 4 2 5  0.67  The scaled scores in the Reference Group Scaled Score column are based on the performance of examinees aged 20:0-34:11 (i.e., the reference group). See Chapter 6 of the WAIS-IV Technical and Interpretive Manual for more information.  Perceptual Reasoning Subtests Summary  Subtest Raw Score Scaled Score Percentile Rank Reference Group Scaled Score SEM  Block Design 24 7 16 6  1.04  Matrix Reasoning 8 6 9 4  0.95  Visual Puzzles 8 7 16 6  0.99   Working Librarian, academic Raw Score Scaled Score Percentile Rank Reference Group Scaled Score SEM  Digit Span 20 7 16 6  0.85  Arithmetic 6 3 1 4  1.04   Processing Speed Subtests Summary  Subtest Raw Score Scaled Score Percentile Rank Reference Group Scaled Score SEM  Symbol Search 15 5 5 4  1.31  Coding 42 7 16 5  0.99    WMS-IV:   Index Score Summary  Index Sum of Scaled Scores Index Score Percentile Rank 95% Confidence Interval  Tourist information centre manager Memory (AMI) 8 49 <0.1 45-58 Extremely Low  Visual Memory (VMI) 14 59 0.3 55-66 Extremely Low  Visual Working Memory (VWMI) 8 63 1 58-73 Extremely Low   Immediate Memory (IMI) 11 51 0.1 47-60 Extremely Low  Delayed Memory (DMI) 11 51 0.1 47-61 Extremely Low    Primary Subtest Scaled Score Summary  Subtest Domain Raw Score Scaled Score Percentile Rank  Logical Memory I AM 6 1 0.1  Logical Memory II AM 1 1 0.1  Verbal Paired Associates I AM 6 3 1   Verbal Paired Associates II AM 2 3 1   Designs I VM 46 6 9  Designs II VM 35 6 9  Visual Reproduction I VM 15 1 0.1  Visual Reproduction II VM 0 1 0.1  Spatial Addition VWM 3 4 2   Symbol Span VWM 6 4 2    Auditory Memory Process Score Summary  Process Score Raw Score Scaled Score Percentile Rank Cumulative Percentage (Base Rate)  LM II Recognition 16 - - <=2%  VPA II Recognition 25 - - <=2%   Visual Memory Process Score Summary  Process Score Raw Score Scaled Score Percentile Rank Cumulative Percentage (Base Rate)  DE I Content 29 7 16  -  DE I Spatial 11 6 9  -  DE II Content 22 5 5  -  DE II Spatial 7 6 9  -  DE II Recognition 13 - - 26-50%  VR II Recognition 5 - - 26-50%     ABILITY-MEMORY ANALYSIS  Ability Score:  VCI: 81 Date of Testing:  WAIS-IV; WMS-IV 2023/07/19  Predicted Difference Method   Index Predicted WMS-IV Index Score Actual WMS-IV Index Score Difference Critical Value  Significant Difference Y/N Base Rate  Auditory Memory 90 49 41 9.00 Y <1%  Visual Memory 91 59 32 8.38 Y <1%  Visual Working Memory 90 63 27 10.86 Y 1-2%  Immediate Memory 89 51 38 10.12 Y <1%  Delayed Memory 90 51 39 9.95 Y <1%  Statistical significance (critical value) at the .01 level.    WCST (Partial data):    WCST scores Raw scores  Trials Administered Total Correct 64 40     Total Errors % Errors 24 38%     Perseverative Responses % Perseverative Responses 11 17%     Perseverative Errors % Perseverative Errors 11 17%     Nonperseverative Errors % Nonperseverative Errors 13 20%     Conceptual Level Responses % Conceptual Level Responses 37 58%      Categories Completed Trials to Complete 1st Category Failure to Maintain Set Learning to Learn 2 11 1  -39.90       Feedback to Patient: KIRBY ARGUETA will return on 11/29/2023 for an interactive feedback session with Dr. Corina at which time her test performances, clinical impressions and treatment recommendations will be reviewed in detail. The patient understands she can contact our office should she require our assistance before this time.  165 minutes spent face-to-face with patient administering standardized tests, 30 minutes spent scoring Radiographer, therapeutic). [CPT [id number], [id number]]  Full report to follow.

## 2023-08-11 ENCOUNTER — Other Ambulatory Visit (HOSPITAL_COMMUNITY): Payer: Self-pay

## 2023-08-13 ENCOUNTER — Other Ambulatory Visit (HOSPITAL_COMMUNITY): Payer: Self-pay

## 2023-08-16 NOTE — Pre-Procedure Instructions (Signed)
 Surgical Instructions   Your procedure is scheduled on Monday, August 4th. Report to [facility] Main Entrance A at 07:30 A.M., then check in with the Admitting office. Any questions or running late day of surgery: call [phone]  Questions prior to your surgery date: call [phone], Monday-Friday, 8am-4pm. If you experience any cold or flu symptoms such as cough, fever, chills, shortness of breath, etc. between now and your scheduled surgery, please notify us  at the above number.     Remember:  Do not eat after midnight the night before your surgery  You may drink clear liquids until 07:00 AM the morning of your surgery.   Clear liquids allowed are: Water, Non-Citrus Juices (without pulp), Carbonated Beverages, Clear Tea (no milk, honey, etc.), Black Coffee Only (NO MILK, CREAM OR POWDERED CREAMER of any kind), and Gatorade.  Patient Instructions  The night before surgery:  No food after midnight. ONLY clear liquids after midnight   The day of surgery (if you have diabetes): Drink ONE (1) 12 oz G2 given to you in your pre admission testing appointment by 07:00 AM the morning of surgery. Drink in one sitting. Do not sip.  This drink was given to you during your hospital  pre-op appointment visit.  Nothing else to drink after completing the  12 oz bottle of G2.         If you have questions, please contact your surgeon's office.    Take these medicines the morning of surgery with A SIP OF WATER  baclofen  (LIORESAL )  esomeprazole  (NEXIUM )  fluticasone  (FLONASE )  gabapentin  (NEURONTIN )  levothyroxine  (SYNTHROID )  loratadine (CLARITIN)  metoprolol  tartrate (LOPRESSOR )    May take these medicines IF NEEDED: albuterol  (PROVENTIL  HFA;VENTOLIN  HFA)  diazepam  (VALIUM )    Follow your surgeon's instructions on when to stop Aspirin .  If no instructions were given by your surgeon then you will need to call the office to get those instructions.    One week prior to surgery,  STOP taking any Aleve, Naproxen, Ibuprofen , Motrin , Advil , Goody's, BC's, all herbal medications, fish oil, and non-prescription vitamins. This includes celecoxib  (CELEBREX ) and meloxicam  (MOBIC ).                     Do NOT Smoke (Tobacco/Vaping) for 24 hours prior to your procedure.  If you use a CPAP at night, you may bring your mask/headgear for your overnight stay.   You will be asked to remove any contacts, glasses, piercing's, hearing aid's, dentures/partials prior to surgery. Please bring cases for these items if needed.    Patients discharged the day of surgery will not be allowed to drive home, and someone needs to stay with them for 24 hours.  SURGICAL WAITING ROOM VISITATION Patients may have no more than 2 support people in the waiting area - these visitors may rotate.   Pre-op nurse will coordinate an appropriate time for 1 ADULT support person, who may not rotate, to accompany patient in pre-op.  Children under the age of 68 must have an adult with them who is not the patient and must remain in the main waiting area with an adult.  If the patient needs to stay at the hospital during part of their recovery, the visitor guidelines for inpatient rooms apply.  Please refer to the [facility] website for the visitor guidelines for any additional information.   If you received a COVID test during your pre-op visit  it is requested that you wear a mask when  out in public, stay away from anyone that may not be feeling well and notify your surgeon if you develop symptoms. If you have been in contact with anyone that has tested positive in the last 10 days please notify you surgeon.      Pre-operative 5 CHG Bathing Instructions   You can play a key role in reducing the risk of infection after surgery. Your skin needs to be as free of germs as possible. You can reduce the number of germs on your skin by washing with CHG (chlorhexidine  gluconate) soap before surgery. CHG is an  antiseptic soap that kills germs and continues to kill germs even after washing.   DO NOT use if you have an allergy to chlorhexidine /CHG or antibacterial soaps. If your skin becomes reddened or irritated, stop using the CHG and notify one of our RNs at [phone].   Please shower with the CHG soap starting 4 days before surgery using the following schedule:     Please keep in mind the following:  DO NOT shave, including legs and underarms, starting the day of your first shower.   You may shave your face at any point before/day of surgery.  Place clean sheets on your bed the day you start using CHG soap. Use a clean washcloth (not used since being washed) for each shower. DO NOT sleep with pets once you start using the CHG.   CHG Shower Instructions:  Wash your face and private area with normal soap. If you choose to wash your hair, wash first with your normal shampoo.  After you use shampoo/soap, rinse your hair and body thoroughly to remove shampoo/soap residue.  Turn the water OFF and apply about 3 tablespoons (45 ml) of CHG soap to a CLEAN washcloth.  Apply CHG soap ONLY FROM YOUR NECK DOWN TO YOUR TOES (washing for 3-5 minutes)  DO NOT use CHG soap on face, private areas, open wounds, or sores.  Pay special attention to the area where your surgery is being performed.  If you are having back surgery, having someone wash your back for you may be helpful. Wait 2 minutes after CHG soap is applied, then you may rinse off the CHG soap.  Pat dry with a clean towel  Put on clean clothes/pajamas   If you choose to wear lotion, please use ONLY the CHG-compatible lotions that are listed below.  Additional instructions for the day of surgery: DO NOT APPLY any lotions, deodorants, cologne, or perfumes.   Do not bring valuables to the hospital. [facility] is not responsible for any belongings/valuables. Do not wear nail polish, gel polish, artificial nails, or any other type of covering on  natural nails (fingers and toes) Do not wear jewelry or makeup Put on clean/comfortable clothes.  Please brush your teeth.  Ask your nurse before applying any prescription medications to the skin.     CHG Compatible Lotions   Aveeno Moisturizing lotion  Cetaphil Moisturizing Cream  Cetaphil Moisturizing Lotion  Clairol Herbal Essence Moisturizing Lotion, Dry Skin  Clairol Herbal Essence Moisturizing Lotion, Extra Dry Skin  Clairol Herbal Essence Moisturizing Lotion, Normal Skin  Curel Age Defying Therapeutic Moisturizing Lotion with Alpha Hydroxy  Curel Extreme Care Body Lotion  Curel Soothing Hands Moisturizing Hand Lotion  Curel Therapeutic Moisturizing Cream, Fragrance-Free  Curel Therapeutic Moisturizing Lotion, Fragrance-Free  Curel Therapeutic Moisturizing Lotion, Original Formula  Eucerin Daily Replenishing Lotion  Eucerin Dry Skin Therapy Plus Alpha Hydroxy Crme  Eucerin Dry Skin Therapy Plus Alpha  Hydroxy Lotion  Eucerin Original Crme  Eucerin Original Lotion  Eucerin Plus Crme Eucerin Plus Lotion  Eucerin TriLipid Replenishing Lotion  Keri Anti-Bacterial Hand Lotion  Keri Deep Conditioning Original Lotion Dry Skin Formula Softly Scented  Keri Deep Conditioning Original Lotion, Fragrance Free Sensitive Skin Formula  Keri Lotion Fast Absorbing Fragrance Free Sensitive Skin Formula  Keri Lotion Fast Absorbing Softly Scented Dry Skin Formula  Keri Original Lotion  Keri Skin Renewal Lotion Keri Silky Smooth Lotion  Keri Silky Smooth Sensitive Skin Lotion  Nivea Body Creamy Conditioning Oil  Nivea Body Extra Enriched Lotion  Nivea Body Original Lotion  Nivea Body Sheer Moisturizing Lotion Nivea Crme  Nivea Skin Firming Lotion  NutraDerm 30 Skin Lotion  NutraDerm Skin Lotion  NutraDerm Therapeutic Skin Cream  NutraDerm Therapeutic Skin Lotion  ProShield Protective Hand Cream  Provon moisturizing lotion  Please read over the following fact sheets that you were  given.

## 2023-08-17 ENCOUNTER — Other Ambulatory Visit: Payer: Self-pay

## 2023-08-17 ENCOUNTER — Encounter (HOSPITAL_COMMUNITY)
Admission: RE | Admit: 2023-08-17 | Discharge: 2023-08-17 | Disposition: A | Discharge status: Home / Self Care | Source: Ambulatory Visit | Attending: Orthopaedic Surgery | Admitting: Orthopaedic Surgery

## 2023-08-17 ENCOUNTER — Encounter (HOSPITAL_COMMUNITY): Payer: Self-pay

## 2023-08-17 ENCOUNTER — Other Ambulatory Visit (HOSPITAL_COMMUNITY): Payer: Self-pay

## 2023-08-17 VITALS — BP 106/79 | HR 59 | Temp 97.8°F | Resp 17 | Ht 63.5 in | Wt 195.6 lb

## 2023-08-17 DIAGNOSIS — E669 Obesity, unspecified: Secondary | ICD-10-CM | POA: Insufficient documentation

## 2023-08-17 DIAGNOSIS — Z01812 Encounter for preprocedural laboratory examination: Secondary | ICD-10-CM | POA: Insufficient documentation

## 2023-08-17 DIAGNOSIS — Z6834 Body mass index (BMI) 34.0-34.9, adult: Secondary | ICD-10-CM | POA: Diagnosis not present

## 2023-08-17 DIAGNOSIS — Z9884 Bariatric surgery status: Secondary | ICD-10-CM | POA: Insufficient documentation

## 2023-08-17 DIAGNOSIS — M1711 Unilateral primary osteoarthritis, right knee: Secondary | ICD-10-CM | POA: Diagnosis not present

## 2023-08-17 DIAGNOSIS — I10 Essential (primary) hypertension: Secondary | ICD-10-CM | POA: Insufficient documentation

## 2023-08-17 DIAGNOSIS — G4733 Obstructive sleep apnea (adult) (pediatric): Secondary | ICD-10-CM | POA: Insufficient documentation

## 2023-08-17 DIAGNOSIS — Z01818 Encounter for other preprocedural examination: Secondary | ICD-10-CM

## 2023-08-17 HISTORY — DX: Prediabetes: R73.03

## 2023-08-17 HISTORY — DX: Presence of other specified functional implants: Z96.89

## 2023-08-17 HISTORY — DX: Personal history of urinary calculi: Z87.442

## 2023-08-17 LAB — BASIC METABOLIC PANEL WITH GFR
Anion gap: 9 (ref 5–15)
BUN: 12 mg/dL (ref 8–23)
CO2: 27 mmol/L (ref 22–32)
Calcium: 9.8 mg/dL (ref 8.9–10.3)
Chloride: 104 mmol/L (ref 98–111)
Creatinine, Ser: 0.76 mg/dL (ref 0.44–1.00)
GFR, Estimated: >60 mL/min (ref >60)
Glucose, Bld: 86 mg/dL (ref 70–99)
Potassium: 4.2 mmol/L (ref 3.5–5.1)
Sodium: 140 mmol/L (ref 135–145)

## 2023-08-17 LAB — CBC
HCT: 42.6 % (ref 36.0–46.0)
Hemoglobin: 13.3 g/dL (ref 12.0–15.0)
MCH: 27 pg (ref 26.0–34.0)
MCHC: 31.2 g/dL (ref 30.0–36.0)
MCV: 86.4 fL (ref 80.0–100.0)
Platelets: 315 K/uL (ref 150–400)
RBC: 4.93 MIL/uL (ref 3.87–5.11)
RDW: 13.5 % (ref 11.5–15.5)
WBC: 6.5 K/uL (ref 4.0–10.5)
nRBC: 0 % (ref 0.0–0.2)

## 2023-08-17 LAB — SURGICAL PCR SCREEN
MRSA, PCR: NEGATIVE
Staphylococcus aureus: NEGATIVE

## 2023-08-17 NOTE — Progress Notes (Addendum)
 PCP - Dr. Glendia Freeman Cardiologist - Dr. Jerel Balding - last office visit 09/08/2022  PPM/ICD - Denies Device Orders - n/a Rep Notified - n/a  Chest x-ray - n/a EKG - 09/08/2022 Stress Test - 01/20/2005 ECHO - 05/18/2013 Cardiac Cath - 2007  Sleep Study - +OSA. Pt wears CPAP nightly. No aware of pressure settings  Pt is Pre-DM  Last dose of GLP1 agonist- n/a GLP1 instructions: n/a  Blood Thinner Instructions: n/a Aspirin  Instructions: Pt instructed to contact surgeon's office for ASA instructions  ERAS Protcol - Clear liquids until 0700 morning of surgery PRE-SURGERY Ensure or G2- Ensure given to pt with instructions  COVID TEST- n/a   Anesthesia review: Yes. Medical clearance  Patient denies shortness of breath, fever, cough and chest pain at PAT appointment. Pt denies any respiratory illness/infection in the last two months.    All instructions explained to the patient, with a verbal understanding of the material. Patient agrees to go over the instructions while at home for a better understanding. Patient also instructed to self quarantine after being tested for COVID-19. The opportunity to ask questions was provided.

## 2023-08-17 NOTE — Pre-Procedure Instructions (Signed)
 Surgical Instructions   Your procedure is scheduled on Monday, August 4th. Report to [facility] Main Entrance A at 7:30 A.M., then check in with the Admitting office. Any questions or running late day of surgery: call [phone]  Questions prior to your surgery date: call [phone], Monday-Friday, 8am-4pm. If you experience any cold or flu symptoms such as cough, fever, chills, shortness of breath, etc. between now and your scheduled surgery, please notify us  at the above number.     Remember:  Do not eat after midnight the night before your surgery  You may drink clear liquids until 7:00 AM the morning of your surgery.   Clear liquids allowed are: Water, Non-Citrus Juices (without pulp), Carbonated Beverages, Clear Tea (no milk, honey, etc.), Black Coffee Only (NO MILK, CREAM OR POWDERED CREAMER of any kind), and Gatorade.  Patient Instructions  The night before surgery:  No food after midnight. ONLY clear liquids after midnight  The day of surgery (if you do NOT have diabetes):  Drink ONE (1) Pre-Surgery Clear Ensure by 7:00 AM the morning of surgery. Drink in one sitting. Do not sip.  This drink was given to you during your hospital  pre-op appointment visit.  Nothing else to drink after completing the  Pre-Surgery Clear Ensure.         If you have questions, please contact your surgeon's office.   Take these medicines the morning of surgery with A SIP OF WATER  baclofen  (LIORESAL )  esomeprazole  (NEXIUM )  fluticasone  (FLONASE )  gabapentin  (NEURONTIN )  levothyroxine  (SYNTHROID )  loratadine (CLARITIN)  metoprolol  tartrate (LOPRESSOR )    May take these medicines IF NEEDED: albuterol  (PROVENTIL  HFA;VENTOLIN  HFA)  diazepam  (VALIUM )    Follow your surgeon's instructions on when to stop Aspirin .  If no instructions were given by your surgeon then you will need to call the office to get those instructions.    One week prior to surgery, STOP taking any Aleve,  Naproxen, Ibuprofen , Motrin , Advil , Goody's, BC's, all herbal medications, fish oil, and non-prescription vitamins. This includes celecoxib  (CELEBREX ) and meloxicam  (MOBIC ).                     Do NOT Smoke (Tobacco/Vaping) for 24 hours prior to your procedure.  If you use a CPAP at night, you may bring your mask/headgear for your overnight stay.   You will be asked to remove any contacts, glasses, piercing's, hearing aid's, dentures/partials prior to surgery. Please bring cases for these items if needed.    Patients discharged the day of surgery will not be allowed to drive home, and someone needs to stay with them for 24 hours.  SURGICAL WAITING ROOM VISITATION Patients may have no more than 2 support people in the waiting area - these visitors may rotate.   Pre-op nurse will coordinate an appropriate time for 1 ADULT support person, who may not rotate, to accompany patient in pre-op.  Children under the age of 60 must have an adult with them who is not the patient and must remain in the main waiting area with an adult.  If the patient needs to stay at the hospital during part of their recovery, the visitor guidelines for inpatient rooms apply.  Please refer to the [facility] website for the visitor guidelines for any additional information.   If you received a COVID test during your pre-op visit  it is requested that you wear a mask when out in public, stay away from anyone that may not  be feeling well and notify your surgeon if you develop symptoms. If you have been in contact with anyone that has tested positive in the last 10 days please notify you surgeon.      Pre-operative 5 CHG Bathing Instructions   You can play a key role in reducing the risk of infection after surgery. Your skin needs to be as free of germs as possible. You can reduce the number of germs on your skin by washing with CHG (chlorhexidine  gluconate) soap before surgery. CHG is an antiseptic soap that kills germs  and continues to kill germs even after washing.   DO NOT use if you have an allergy to chlorhexidine /CHG or antibacterial soaps. If your skin becomes reddened or irritated, stop using the CHG and notify one of our RNs at [phone].   Please shower with the CHG soap starting 4 days before surgery using the following schedule:     Please keep in mind the following:  DO NOT shave, including legs and underarms, starting the day of your first shower.   You may shave your face at any point before/day of surgery.  Place clean sheets on your bed the day you start using CHG soap. Use a clean washcloth (not used since being washed) for each shower. DO NOT sleep with pets once you start using the CHG.   CHG Shower Instructions:  Wash your face and private area with normal soap. If you choose to wash your hair, wash first with your normal shampoo.  After you use shampoo/soap, rinse your hair and body thoroughly to remove shampoo/soap residue.  Turn the water OFF and apply about 3 tablespoons (45 ml) of CHG soap to a CLEAN washcloth.  Apply CHG soap ONLY FROM YOUR NECK DOWN TO YOUR TOES (washing for 3-5 minutes)  DO NOT use CHG soap on face, private areas, open wounds, or sores.  Pay special attention to the area where your surgery is being performed.  If you are having back surgery, having someone wash your back for you may be helpful. Wait 2 minutes after CHG soap is applied, then you may rinse off the CHG soap.  Pat dry with a clean towel  Put on clean clothes/pajamas   If you choose to wear lotion, please use ONLY the CHG-compatible lotions that are listed below.  Additional instructions for the day of surgery: DO NOT APPLY any lotions, deodorants, cologne, or perfumes.   Do not bring valuables to the hospital. [facility] is not responsible for any belongings/valuables. Do not wear nail polish, gel polish, artificial nails, or any other type of covering on natural nails (fingers and  toes) Do not wear jewelry or makeup Put on clean/comfortable clothes.  Please brush your teeth.  Ask your nurse before applying any prescription medications to the skin.     CHG Compatible Lotions   Aveeno Moisturizing lotion  Cetaphil Moisturizing Cream  Cetaphil Moisturizing Lotion  Clairol Herbal Essence Moisturizing Lotion, Dry Skin  Clairol Herbal Essence Moisturizing Lotion, Extra Dry Skin  Clairol Herbal Essence Moisturizing Lotion, Normal Skin  Curel Age Defying Therapeutic Moisturizing Lotion with Alpha Hydroxy  Curel Extreme Care Body Lotion  Curel Soothing Hands Moisturizing Hand Lotion  Curel Therapeutic Moisturizing Cream, Fragrance-Free  Curel Therapeutic Moisturizing Lotion, Fragrance-Free  Curel Therapeutic Moisturizing Lotion, Original Formula  Eucerin Daily Replenishing Lotion  Eucerin Dry Skin Therapy Plus Alpha Hydroxy Crme  Eucerin Dry Skin Therapy Plus Alpha Hydroxy Lotion  Eucerin Original Crme  Eucerin Original Lotion  Eucerin Plus Crme Eucerin Plus Lotion  Eucerin TriLipid Replenishing Lotion  Keri Anti-Bacterial Hand Lotion  Keri Deep Conditioning Original Lotion Dry Skin Formula Softly Scented  Keri Deep Conditioning Original Lotion, Fragrance Free Sensitive Skin Formula  Keri Lotion Fast Absorbing Fragrance Free Sensitive Skin Formula  Keri Lotion Fast Absorbing Softly Scented Dry Skin Formula  Keri Original Lotion  Keri Skin Renewal Lotion Keri Silky Smooth Lotion  Keri Silky Smooth Sensitive Skin Lotion  Nivea Body Creamy Conditioning Oil  Nivea Body Extra Enriched Lotion  Nivea Body Original Lotion  Nivea Body Sheer Moisturizing Lotion Nivea Crme  Nivea Skin Firming Lotion  NutraDerm 30 Skin Lotion  NutraDerm Skin Lotion  NutraDerm Therapeutic Skin Cream  NutraDerm Therapeutic Skin Lotion  ProShield Protective Hand Cream  Provon moisturizing lotion  Please read over the following fact sheets that you were given.

## 2023-08-18 ENCOUNTER — Encounter (HOSPITAL_COMMUNITY): Payer: Self-pay

## 2023-08-18 NOTE — Progress Notes (Signed)
 Anesthesia Chart Review:  63 yo female with pertinent hx including HTN, OSA on CPAP, obesity BMI 34, gastric sleeve 2018 with subsequent 100lb weight loss, s/p of L3-5 lumbar fusion, s/p placement of spinal cord stimulator at T9-10.  With history of lumbar fusion and SCS placement, pt may not be candidate for spinal. Will be evaluated DOS by assigned anesthesiologist.   Preop labs reviewed, WNL.  EKG 09/08/22: NSR. Rate 61.  TTE 05/18/13: - Left ventricle: The cavity size was normal. Systolic    function was normal. The estimated ejection fraction was    in the range of 55% to 60%. Wall motion was normal; there    were no regional wall motion abnormalities. There was an    increased relative contribution of atrial contraction to    ventricular filling. Doppler parameters are consistent    with abnormal left ventricular relaxation (grade 1    diastolic dysfunction).  - Mitral valve: Mild regurgitation. Valve area by pressure    half-time: 1.8cm^2.    Lynwood Geofm RIGGERS [facility] Short Stay Center/Anesthesiology Phone [phone] 08/18/2023 3:43 PM

## 2023-08-18 NOTE — Anesthesia Preprocedure Evaluation (Addendum)
 Anesthesia Evaluation  Patient identified by MRN, date of birth, ID band Patient awake    Reviewed: Allergy & Precautions, H&P , NPO status , Patient's Chart, lab work & pertinent test results, reviewed documented beta blocker date and time   Airway Mallampati: II  TM Distance: >3 FB Neck ROM: Full    Dental  (+) Teeth Intact, Dental Advisory Given   Pulmonary sleep apnea and Continuous Positive Airway Pressure Ventilation , former smoker Has 2 nasal sprays at home, hasn't used them in a while   Pulmonary exam normal breath sounds clear to auscultation       Cardiovascular hypertension (127/76 preop, HR 39-41 in preop- took metoprolol  this AM), Pt. on medications and Pt. on home beta blockers Normal cardiovascular exam+ Valvular Problems/Murmurs (mild MR on echo 2015) MR  Rhythm:Regular Rate:Normal  Echo 2015 - Left ventricle: The cavity size was normal. Systolic    function was normal. The estimated ejection fraction was    in the range of 55% to 60%. Wall motion was normal; there    were no regional wall motion abnormalities. There was an    increased relative contribution of atrial contraction to    ventricular filling. Doppler parameters are consistent    with abnormal left ventricular relaxation (grade 1    diastolic dysfunction).  - Mitral valve: Mild regurgitation. Valve area by pressure    half-time: 1.8cm^2.    Sinus brady in preop, last EKG 1 year ago sinus brady at 60bpm   Cath 2007- pt doesn't remember why, doesn't think she has any intervention at the time. Does not follow w/ a cardiologist    Neuro/Psych  Headaches PSYCHIATRIC DISORDERS Anxiety        GI/Hepatic Neg liver ROS,GERD  Medicated and Controlled,,S/p gastric sleeve 2018   Endo/Other  diabetes, Well Controlled, Type 2Hypothyroidism  BMI 35  Renal/GU negative Renal ROS  negative genitourinary   Musculoskeletal  (+) Arthritis , Osteoarthritis,   Spinal cord stimulator 4 years ago, lumbar fusion 2011 s/p of L3-5 lumbar fusion, s/p placement of spinal cord stimulator at T9-10.    Abdominal  (+) + obese  Peds negative pediatric ROS (+)  Hematology negative hematology ROS (+) Hb 13.3, plt 315   Anesthesia Other Findings   Reproductive/Obstetrics negative OB ROS                              Anesthesia Physical Anesthesia Plan  ASA: 3  Anesthesia Plan: General and Regional   Post-op Pain Management: Tylenol  PO (pre-op)* and Regional block*   Induction: Intravenous  PONV Risk Score and Plan: 3 and Dexamethasone , Midazolam , Ondansetron  and Treatment may vary due to age or medical condition  Airway Management Planned: Oral ETT  Additional Equipment: None  Intra-op Plan:   Post-operative Plan: Extubation in OR  Informed Consent: I have reviewed the patients History and Physical, chart, labs and discussed the procedure including the risks, benefits and alternatives for the proposed anesthesia with the patient or authorized representative who has indicated his/her understanding and acceptance.     Dental advisory given  Plan Discussed with: CRNA  Anesthesia Plan Comments:          Anesthesia Quick Evaluation

## 2023-08-22 ENCOUNTER — Other Ambulatory Visit: Payer: Self-pay | Admitting: Physician Assistant

## 2023-08-22 ENCOUNTER — Other Ambulatory Visit (HOSPITAL_COMMUNITY): Payer: Self-pay

## 2023-08-22 MED ORDER — ONDANSETRON HCL 4 MG PO TABS
4.0000 mg | ORAL_TABLET | Freq: Three times a day (TID) | ORAL | 0 refills | Status: DC | PRN
  Filled 2023-08-22 – 2023-08-30 (×2): qty 40, 14d supply, fill #0

## 2023-08-22 MED ORDER — DOCUSATE SODIUM 100 MG PO CAPS
100.0000 mg | ORAL_CAPSULE | Freq: Every day | ORAL | 2 refills | Status: DC | PRN
  Filled 2023-08-22 – 2023-08-30 (×2): qty 30, 30d supply, fill #0

## 2023-08-22 MED ORDER — DOXYCYCLINE HYCLATE 100 MG PO TABS
100.0000 mg | ORAL_TABLET | Freq: Two times a day (BID) | ORAL | 0 refills | Status: DC
Start: 2023-08-22 — End: 2023-08-31
  Filled 2023-08-22: qty 20, 10d supply, fill #0

## 2023-08-22 MED ORDER — METHOCARBAMOL 500 MG PO TABS
500.0000 mg | ORAL_TABLET | Freq: Two times a day (BID) | ORAL | 2 refills | Status: AC | PRN
Start: 2023-08-22 — End: ?
  Filled 2023-08-22 – 2023-08-30 (×2): qty 20, 10d supply, fill #0

## 2023-08-22 MED ORDER — OXYCODONE-ACETAMINOPHEN 5-325 MG PO TABS
1.0000 | ORAL_TABLET | Freq: Four times a day (QID) | ORAL | 0 refills | Status: DC | PRN
  Filled 2023-08-22 – 2023-08-30 (×2): qty 40, 5d supply, fill #0

## 2023-08-23 ENCOUNTER — Ambulatory Visit: Payer: Commercial Managed Care - PPO | Admitting: Neurology

## 2023-08-23 ENCOUNTER — Encounter: Payer: Self-pay | Admitting: Neurology

## 2023-08-29 ENCOUNTER — Other Ambulatory Visit (HOSPITAL_COMMUNITY): Payer: Self-pay

## 2023-08-29 ENCOUNTER — Observation Stay (HOSPITAL_COMMUNITY)

## 2023-08-29 ENCOUNTER — Observation Stay (HOSPITAL_COMMUNITY)
Admission: RE | Admit: 2023-08-29 | Discharge: 2023-08-31 | Disposition: A | Discharge status: Home / Self Care | Attending: Orthopaedic Surgery | Admitting: Orthopaedic Surgery

## 2023-08-29 ENCOUNTER — Encounter (HOSPITAL_COMMUNITY): Payer: Self-pay | Admitting: Orthopaedic Surgery

## 2023-08-29 ENCOUNTER — Encounter (HOSPITAL_COMMUNITY)
Admission: RE | Disposition: A | Discharge status: Home / Self Care | Payer: Self-pay | Source: Home / Self Care | Attending: Orthopaedic Surgery

## 2023-08-29 ENCOUNTER — Ambulatory Visit (HOSPITAL_COMMUNITY): Payer: Self-pay | Admitting: Physician Assistant

## 2023-08-29 ENCOUNTER — Ambulatory Visit (HOSPITAL_COMMUNITY): Admitting: Certified Registered Nurse Anesthetist

## 2023-08-29 ENCOUNTER — Other Ambulatory Visit: Payer: Self-pay | Admitting: Physician Assistant

## 2023-08-29 ENCOUNTER — Other Ambulatory Visit: Payer: Self-pay

## 2023-08-29 DIAGNOSIS — Z96651 Presence of right artificial knee joint: Secondary | ICD-10-CM | POA: Diagnosis not present

## 2023-08-29 DIAGNOSIS — I10 Essential (primary) hypertension: Secondary | ICD-10-CM | POA: Insufficient documentation

## 2023-08-29 DIAGNOSIS — M1711 Unilateral primary osteoarthritis, right knee: Secondary | ICD-10-CM | POA: Diagnosis not present

## 2023-08-29 DIAGNOSIS — E039 Hypothyroidism, unspecified: Secondary | ICD-10-CM | POA: Diagnosis not present

## 2023-08-29 DIAGNOSIS — Z7982 Long term (current) use of aspirin: Secondary | ICD-10-CM | POA: Diagnosis not present

## 2023-08-29 DIAGNOSIS — Z91040 Latex allergy status: Secondary | ICD-10-CM | POA: Insufficient documentation

## 2023-08-29 DIAGNOSIS — Z87891 Personal history of nicotine dependence: Secondary | ICD-10-CM | POA: Diagnosis not present

## 2023-08-29 DIAGNOSIS — G4733 Obstructive sleep apnea (adult) (pediatric): Secondary | ICD-10-CM

## 2023-08-29 HISTORY — PX: TOTAL KNEE ARTHROPLASTY: SHX125

## 2023-08-29 SURGERY — ARTHROPLASTY, KNEE, TOTAL
Anesthesia: Regional | Site: Knee | Laterality: Right

## 2023-08-29 MED ORDER — DEXAMETHASONE SODIUM PHOSPHATE 10 MG/ML IJ SOLN
INTRAMUSCULAR | Status: DC | PRN
  Administered 2023-08-29: 10 mg via INTRAVENOUS

## 2023-08-29 MED ORDER — METHOCARBAMOL 500 MG PO TABS
500.0000 mg | ORAL_TABLET | Freq: Four times a day (QID) | ORAL | Status: DC | PRN
  Administered 2023-08-30 – 2023-08-31 (×2): 500 mg via ORAL
  Filled 2023-08-29 (×2): qty 1

## 2023-08-29 MED ORDER — ACETAMINOPHEN 500 MG PO TABS
1000.0000 mg | ORAL_TABLET | Freq: Four times a day (QID) | ORAL | Status: AC
  Administered 2023-08-29 – 2023-08-30 (×4): 1000 mg via ORAL
  Filled 2023-08-29 (×4): qty 2

## 2023-08-29 MED ORDER — ONDANSETRON HCL 4 MG/2ML IJ SOLN
INTRAMUSCULAR | Status: DC | PRN
  Administered 2023-08-29: 4 mg via INTRAVENOUS

## 2023-08-29 MED ORDER — POVIDONE-IODINE 10 % EX SWAB
2.0000 | Freq: Once | CUTANEOUS | Status: AC
  Administered 2023-08-29: 2 via TOPICAL

## 2023-08-29 MED ORDER — MIDAZOLAM HCL 2 MG/2ML IJ SOLN: 2.0000 mg | Freq: Once | INTRAMUSCULAR | Status: AC

## 2023-08-29 MED ORDER — TRANEXAMIC ACID 1000 MG/10ML IV SOLN
INTRAVENOUS | Status: DC | PRN
  Administered 2023-08-29: 2000 mg via TOPICAL

## 2023-08-29 MED ORDER — LEVOTHYROXINE SODIUM 75 MCG PO TABS
75.0000 ug | ORAL_TABLET | Freq: Every morning | ORAL | Status: DC
  Administered 2023-08-30 – 2023-08-31 (×2): 75 ug via ORAL
  Filled 2023-08-29 (×2): qty 1

## 2023-08-29 MED ORDER — DONEPEZIL HCL 5 MG PO TABS
5.0000 mg | ORAL_TABLET | Freq: Every day | ORAL | Status: DC
  Administered 2023-08-30 – 2023-08-31 (×2): 5 mg via ORAL
  Filled 2023-08-29 (×2): qty 1

## 2023-08-29 MED ORDER — DEXAMETHASONE SODIUM PHOSPHATE 10 MG/ML IJ SOLN
INTRAMUSCULAR | Status: AC
  Filled 2023-08-29: qty 1

## 2023-08-29 MED ORDER — CEFAZOLIN SODIUM-DEXTROSE 2-4 GM/100ML-% IV SOLN
INTRAVENOUS | Status: AC
  Filled 2023-08-29: qty 100

## 2023-08-29 MED ORDER — FENTANYL CITRATE (PF) 100 MCG/2ML IJ SOLN: 100.0000 ug | Freq: Once | INTRAMUSCULAR | Status: AC

## 2023-08-29 MED ORDER — CEFAZOLIN SODIUM-DEXTROSE 2-4 GM/100ML-% IV SOLN
2.0000 g | INTRAVENOUS | Status: AC
  Administered 2023-08-29: 2 g via INTRAVENOUS

## 2023-08-29 MED ORDER — HYDROMORPHONE HCL 1 MG/ML IJ SOLN
INTRAMUSCULAR | Status: AC
  Filled 2023-08-29: qty 1

## 2023-08-29 MED ORDER — ONDANSETRON HCL 4 MG/2ML IJ SOLN
INTRAMUSCULAR | Status: AC
  Filled 2023-08-29: qty 2

## 2023-08-29 MED ORDER — SODIUM CHLORIDE 0.9 % IV SOLN: INTRAVENOUS | Status: DC

## 2023-08-29 MED ORDER — OXYCODONE HCL 5 MG PO TABS
5.0000 mg | ORAL_TABLET | Freq: Once | ORAL | Status: AC | PRN
  Administered 2023-08-29: 5 mg via ORAL

## 2023-08-29 MED ORDER — FENTANYL CITRATE (PF) 250 MCG/5ML IJ SOLN
INTRAMUSCULAR | Status: DC | PRN
  Administered 2023-08-29 (×5): 50 ug via INTRAVENOUS

## 2023-08-29 MED ORDER — HYDROMORPHONE HCL 1 MG/ML IJ SOLN
INTRAMUSCULAR | Status: AC
Start: 2023-08-29 — End: 2023-08-29
  Filled 2023-08-29: qty 1

## 2023-08-29 MED ORDER — STERILE WATER FOR IRRIGATION IR SOLN
Status: DC | PRN
  Administered 2023-08-29: 1000 mL

## 2023-08-29 MED ORDER — ROCURONIUM BROMIDE 10 MG/ML (PF) SYRINGE
PREFILLED_SYRINGE | INTRAVENOUS | Status: DC | PRN
  Administered 2023-08-29: 10 mg via INTRAVENOUS
  Administered 2023-08-29: 60 mg via INTRAVENOUS

## 2023-08-29 MED ORDER — ONDANSETRON HCL 4 MG PO TABS: 4.0000 mg | ORAL_TABLET | Freq: Four times a day (QID) | ORAL | Status: DC | PRN

## 2023-08-29 MED ORDER — KETOROLAC TROMETHAMINE 15 MG/ML IJ SOLN
7.5000 mg | Freq: Four times a day (QID) | INTRAMUSCULAR | Status: AC
  Administered 2023-08-29 – 2023-08-30 (×4): 7.5 mg via INTRAVENOUS
  Filled 2023-08-29 (×4): qty 1

## 2023-08-29 MED ORDER — AMISULPRIDE (ANTIEMETIC) 5 MG/2ML IV SOLN: 10.0000 mg | Freq: Once | INTRAVENOUS | Status: DC | PRN

## 2023-08-29 MED ORDER — CHLORHEXIDINE GLUCONATE 0.12 % MT SOLN: 15.0000 mL | Freq: Once | OROMUCOSAL | Status: AC

## 2023-08-29 MED ORDER — METOCLOPRAMIDE HCL 5 MG PO TABS: 5.0000 mg | ORAL_TABLET | Freq: Three times a day (TID) | ORAL | Status: DC | PRN

## 2023-08-29 MED ORDER — TRANEXAMIC ACID-NACL 1000-0.7 MG/100ML-% IV SOLN
1000.0000 mg | INTRAVENOUS | Status: AC
  Administered 2023-08-29: 1000 mg via INTRAVENOUS

## 2023-08-29 MED ORDER — SUGAMMADEX SODIUM 200 MG/2ML IV SOLN
INTRAVENOUS | Status: DC | PRN
  Administered 2023-08-29: 200 mg via INTRAVENOUS

## 2023-08-29 MED ORDER — HYDROMORPHONE HCL 1 MG/ML IJ SOLN
1.0000 mg | Freq: Three times a day (TID) | INTRAMUSCULAR | Status: DC | PRN
  Administered 2023-08-30: 1 mg via INTRAVENOUS
  Filled 2023-08-29: qty 1

## 2023-08-29 MED ORDER — ONDANSETRON HCL 4 MG/2ML IJ SOLN: 4.0000 mg | Freq: Once | INTRAMUSCULAR | Status: DC | PRN

## 2023-08-29 MED ORDER — DIAZEPAM 5 MG PO TABS
5.0000 mg | ORAL_TABLET | Freq: Every evening | ORAL | Status: DC | PRN
  Administered 2023-08-29: 5 mg via ORAL
  Filled 2023-08-29: qty 1

## 2023-08-29 MED ORDER — TRANEXAMIC ACID-NACL 1000-0.7 MG/100ML-% IV SOLN
INTRAVENOUS | Status: AC
  Filled 2023-08-29: qty 100

## 2023-08-29 MED ORDER — LACTATED RINGERS IV SOLN: INTRAVENOUS | Status: DC

## 2023-08-29 MED ORDER — VANCOMYCIN HCL 1000 MG IV SOLR
INTRAVENOUS | Status: AC
  Filled 2023-08-29: qty 20

## 2023-08-29 MED ORDER — APIXABAN 2.5 MG PO TABS
2.5000 mg | ORAL_TABLET | Freq: Two times a day (BID) | ORAL | 0 refills | Status: DC
  Filled 2023-08-29: qty 60, 30d supply, fill #0

## 2023-08-29 MED ORDER — LIDOCAINE 2% (20 MG/ML) 5 ML SYRINGE
INTRAMUSCULAR | Status: AC
  Filled 2023-08-29: qty 5

## 2023-08-29 MED ORDER — OXYCODONE HCL 5 MG/5ML PO SOLN: 5.0000 mg | Freq: Once | ORAL | Status: AC | PRN

## 2023-08-29 MED ORDER — TRANEXAMIC ACID 1000 MG/10ML IV SOLN
2000.0000 mg | INTRAVENOUS | Status: DC
  Filled 2023-08-29: qty 20

## 2023-08-29 MED ORDER — HYDROMORPHONE HCL 1 MG/ML IJ SOLN
0.2500 mg | INTRAMUSCULAR | Status: DC | PRN
  Administered 2023-08-29 (×4): 0.5 mg via INTRAVENOUS

## 2023-08-29 MED ORDER — FENTANYL CITRATE (PF) 100 MCG/2ML IJ SOLN
INTRAMUSCULAR | Status: AC
  Administered 2023-08-29: 100 ug via INTRAVENOUS
  Filled 2023-08-29: qty 2

## 2023-08-29 MED ORDER — 0.9 % SODIUM CHLORIDE (POUR BTL) OPTIME
TOPICAL | Status: DC | PRN
Start: 2023-08-29 — End: 2023-08-29
  Administered 2023-08-29: 1000 mL

## 2023-08-29 MED ORDER — BUPIVACAINE-MELOXICAM ER 400-12 MG/14ML IJ SOLN
INTRAMUSCULAR | Status: DC | PRN
  Administered 2023-08-29: 400 mg

## 2023-08-29 MED ORDER — TRANEXAMIC ACID-NACL 1000-0.7 MG/100ML-% IV SOLN
1000.0000 mg | Freq: Once | INTRAVENOUS | Status: AC
  Administered 2023-08-29: 1000 mg via INTRAVENOUS
  Filled 2023-08-29: qty 100

## 2023-08-29 MED ORDER — PROPOFOL 10 MG/ML IV BOLUS
INTRAVENOUS | Status: DC | PRN
  Administered 2023-08-29: 20 mg via INTRAVENOUS
  Administered 2023-08-29: 150 mg via INTRAVENOUS

## 2023-08-29 MED ORDER — METOPROLOL TARTRATE 25 MG PO TABS
25.0000 mg | ORAL_TABLET | Freq: Two times a day (BID) | ORAL | Status: DC
  Administered 2023-08-29 – 2023-08-31 (×3): 25 mg via ORAL
  Filled 2023-08-29 (×4): qty 1

## 2023-08-29 MED ORDER — OXYCODONE HCL 5 MG PO TABS
10.0000 mg | ORAL_TABLET | Freq: Four times a day (QID) | ORAL | Status: DC | PRN
  Administered 2023-08-30 – 2023-08-31 (×4): 10 mg via ORAL
  Filled 2023-08-29 (×4): qty 2

## 2023-08-29 MED ORDER — DOXYCYCLINE HYCLATE 100 MG PO TABS
100.0000 mg | ORAL_TABLET | Freq: Two times a day (BID) | ORAL | Status: DC
  Administered 2023-08-29 – 2023-08-31 (×4): 100 mg via ORAL
  Filled 2023-08-29 (×4): qty 1

## 2023-08-29 MED ORDER — CEFAZOLIN SODIUM-DEXTROSE 2-4 GM/100ML-% IV SOLN
2.0000 g | Freq: Four times a day (QID) | INTRAVENOUS | Status: AC
  Administered 2023-08-29 (×2): 2 g via INTRAVENOUS
  Filled 2023-08-29 (×2): qty 100

## 2023-08-29 MED ORDER — DOCUSATE SODIUM 100 MG PO CAPS
100.0000 mg | ORAL_CAPSULE | Freq: Two times a day (BID) | ORAL | Status: DC
  Administered 2023-08-29 – 2023-08-31 (×4): 100 mg via ORAL
  Filled 2023-08-29 (×4): qty 1

## 2023-08-29 MED ORDER — LIDOCAINE HCL (CARDIAC) PF 100 MG/5ML IV SOSY
PREFILLED_SYRINGE | INTRAVENOUS | Status: DC | PRN
  Administered 2023-08-29: 60 mg via INTRAVENOUS

## 2023-08-29 MED ORDER — VANCOMYCIN HCL 1000 MG IV SOLR
INTRAVENOUS | Status: DC | PRN
  Administered 2023-08-29: 1000 mg via TOPICAL

## 2023-08-29 MED ORDER — ROCURONIUM BROMIDE 10 MG/ML (PF) SYRINGE
PREFILLED_SYRINGE | INTRAVENOUS | Status: AC
  Filled 2023-08-29: qty 10

## 2023-08-29 MED ORDER — EPHEDRINE SULFATE-NACL 50-0.9 MG/10ML-% IV SOSY
PREFILLED_SYRINGE | INTRAVENOUS | Status: DC | PRN
  Administered 2023-08-29 (×3): 5 mg via INTRAVENOUS

## 2023-08-29 MED ORDER — MENTHOL 3 MG MT LOZG: 1.0000 | LOZENGE | OROMUCOSAL | Status: DC | PRN

## 2023-08-29 MED ORDER — APIXABAN 2.5 MG PO TABS
2.5000 mg | ORAL_TABLET | Freq: Two times a day (BID) | ORAL | Status: DC
  Administered 2023-08-30 – 2023-08-31 (×3): 2.5 mg via ORAL
  Filled 2023-08-29 (×3): qty 1

## 2023-08-29 MED ORDER — OXYCODONE HCL 5 MG PO TABS
ORAL_TABLET | ORAL | Status: AC
Start: 2023-08-29 — End: 2023-08-29
  Filled 2023-08-29: qty 1

## 2023-08-29 MED ORDER — SODIUM CHLORIDE 0.9 % IR SOLN
Status: DC | PRN
  Administered 2023-08-29: 1000 mL

## 2023-08-29 MED ORDER — METHOCARBAMOL 1000 MG/10ML IJ SOLN: 500.0000 mg | Freq: Four times a day (QID) | INTRAMUSCULAR | Status: DC | PRN

## 2023-08-29 MED ORDER — FENTANYL CITRATE (PF) 250 MCG/5ML IJ SOLN
INTRAMUSCULAR | Status: AC
  Filled 2023-08-29: qty 5

## 2023-08-29 MED ORDER — MIDAZOLAM HCL 2 MG/2ML IJ SOLN
INTRAMUSCULAR | Status: AC
Start: 2023-08-29 — End: 2023-08-29
  Administered 2023-08-29: 2 mg via INTRAVENOUS
  Filled 2023-08-29: qty 2

## 2023-08-29 MED ORDER — EPHEDRINE 5 MG/ML INJ
INTRAVENOUS | Status: AC
  Filled 2023-08-29: qty 5

## 2023-08-29 MED ORDER — PROPOFOL 500 MG/50ML IV EMUL
INTRAVENOUS | Status: DC | PRN
  Administered 2023-08-29: 20 mg via INTRAVENOUS
  Administered 2023-08-29: 50 ug/kg/min via INTRAVENOUS

## 2023-08-29 MED ORDER — OXYCODONE HCL 5 MG PO TABS
5.0000 mg | ORAL_TABLET | Freq: Four times a day (QID) | ORAL | Status: DC | PRN
  Filled 2023-08-29: qty 1

## 2023-08-29 MED ORDER — BUPIVACAINE-MELOXICAM ER 400-12 MG/14ML IJ SOLN
INTRAMUSCULAR | Status: AC
  Filled 2023-08-29: qty 1

## 2023-08-29 MED ORDER — ORAL CARE MOUTH RINSE: 15.0000 mL | Freq: Once | OROMUCOSAL | Status: AC

## 2023-08-29 MED ORDER — METOCLOPRAMIDE HCL 5 MG/ML IJ SOLN: 5.0000 mg | Freq: Three times a day (TID) | INTRAMUSCULAR | Status: DC | PRN

## 2023-08-29 MED ORDER — DEXAMETHASONE SODIUM PHOSPHATE 10 MG/ML IJ SOLN
10.0000 mg | Freq: Once | INTRAMUSCULAR | Status: AC
  Administered 2023-08-30: 10 mg via INTRAVENOUS
  Filled 2023-08-29: qty 1

## 2023-08-29 MED ORDER — PHENOL 1.4 % MT LIQD: 1.0000 | OROMUCOSAL | Status: DC | PRN

## 2023-08-29 MED ORDER — ACETAMINOPHEN 325 MG PO TABS
325.0000 mg | ORAL_TABLET | Freq: Four times a day (QID) | ORAL | Status: DC | PRN
  Administered 2023-08-31: 650 mg via ORAL
  Filled 2023-08-29: qty 2

## 2023-08-29 MED ORDER — GABAPENTIN 300 MG PO CAPS
300.0000 mg | ORAL_CAPSULE | Freq: Three times a day (TID) | ORAL | Status: DC
  Administered 2023-08-29 – 2023-08-31 (×6): 300 mg via ORAL
  Filled 2023-08-29 (×6): qty 1

## 2023-08-29 MED ORDER — ONDANSETRON HCL 4 MG/2ML IJ SOLN: 4.0000 mg | Freq: Four times a day (QID) | INTRAMUSCULAR | Status: DC | PRN

## 2023-08-29 MED ORDER — CHLORHEXIDINE GLUCONATE 0.12 % MT SOLN
OROMUCOSAL | Status: AC
Start: 2023-08-29 — End: 2023-08-29
  Administered 2023-08-29: 15 mL via OROMUCOSAL
  Filled 2023-08-29: qty 15

## 2023-08-29 SURGICAL SUPPLY — 76 items
ALCOHOL 70% 16 OZ (MISCELLANEOUS) ×2 IMPLANT
BAG COUNTER SPONGE SURGICOUNT (BAG) IMPLANT
BAG DECANTER FOR FLEXI CONT (MISCELLANEOUS) ×2 IMPLANT
BANDAGE ESMARK 6X9 LF (GAUZE/BANDAGES/DRESSINGS) IMPLANT
BLADE SAG 18X100X1.27 (BLADE) ×2 IMPLANT
BLADE SAW SGTL 73X25 THK (BLADE) ×2 IMPLANT
BOWL SMART MIX CTS (DISPOSABLE) ×2 IMPLANT
CANISTER WOUNDNEG PRESSURE 500 (CANNISTER) IMPLANT
CEMENT BONE REFOBACIN R1X40 US (Cement) IMPLANT
CLSR STERI-STRIP ANTIMIC 1/2X4 (GAUZE/BANDAGES/DRESSINGS) IMPLANT
COMPONENT FEM CMT PRSONA SZ7RT (Joint) IMPLANT
COMPONENT TIB CMT PS D 0D RT (Joint) IMPLANT
COOLER ICEMAN CLASSIC (MISCELLANEOUS) ×2 IMPLANT
COVER SURGICAL LIGHT HANDLE (MISCELLANEOUS) ×2 IMPLANT
CUFF TOURN SGL QUICK 42 (TOURNIQUET CUFF) IMPLANT
CUFF TRNQT CYL 34X4.125X (TOURNIQUET CUFF) ×2 IMPLANT
DERMABOND ADVANCED .7 DNX12 (GAUZE/BANDAGES/DRESSINGS) ×2 IMPLANT
DRAPE EXTREMITY T 121X128X90 (DISPOSABLE) ×2 IMPLANT
DRAPE HALF SHEET 40X57 (DRAPES) ×2 IMPLANT
DRAPE INCISE IOBAN 66X45 STRL (DRAPES) ×2 IMPLANT
DRAPE POUCH INSTRU U-SHP 10X18 (DRAPES) ×2 IMPLANT
DRAPE SURG ORHT 6 SPLT 77X108 (DRAPES) IMPLANT
DRAPE U-SHAPE 47X51 STRL (DRAPES) ×4 IMPLANT
DRESSING PEEL AND PLAC PRVNA20 (GAUZE/BANDAGES/DRESSINGS) IMPLANT
DRSG AQUACEL AG ADV 3.5X 4 (GAUZE/BANDAGES/DRESSINGS) IMPLANT
DRSG AQUACEL AG ADV 3.5X10 (GAUZE/BANDAGES/DRESSINGS) ×2 IMPLANT
DURAPREP 26ML APPLICATOR (WOUND CARE) ×6 IMPLANT
ELECT CAUTERY BLADE 6.4 (BLADE) ×2 IMPLANT
ELECT PENCIL ROCKER SW 15FT (MISCELLANEOUS) ×2 IMPLANT
ELECTRODE REM PT RTRN 9FT ADLT (ELECTROSURGICAL) ×2 IMPLANT
EVACUATOR 1/8 PVC DRAIN (DRAIN) IMPLANT
GLOVE BIOGEL PI IND STRL 7.0 (GLOVE) ×4 IMPLANT
GLOVE BIOGEL PI IND STRL 7.5 (GLOVE) ×10 IMPLANT
GLOVE ECLIPSE 7.0 STRL STRAW (GLOVE) ×6 IMPLANT
GLOVE INDICATOR 7.0 STRL GRN (GLOVE) ×2 IMPLANT
GLOVE INDICATOR 7.5 STRL GRN (GLOVE) ×2 IMPLANT
GLOVE SURG SYN 7.5 PF PI (GLOVE) ×4 IMPLANT
GLOVE SURG UNDER LTX SZ7.5 (GLOVE) ×4 IMPLANT
GLOVE SURG UNDER POLY LF SZ7 (GLOVE) ×4 IMPLANT
GOWN STRL REUS W/ TWL LRG LVL3 (GOWN DISPOSABLE) ×2 IMPLANT
GOWN STRL SURGICAL XL XLNG (GOWN DISPOSABLE) IMPLANT
GOWN TOGA ZIPPER T7+ PEEL AWAY (MISCELLANEOUS) ×2 IMPLANT
HOOD PEEL AWAY T7 (MISCELLANEOUS) ×2 IMPLANT
KIT BASIN OR (CUSTOM PROCEDURE TRAY) ×2 IMPLANT
KIT TURNOVER KIT B (KITS) ×2 IMPLANT
LINER ASF PERS 10X6/7 CD RT (Liner) IMPLANT
MANIFOLD NEPTUNE II (INSTRUMENTS) ×2 IMPLANT
MARKER SKIN DUAL TIP RULER LAB (MISCELLANEOUS) ×4 IMPLANT
NDL SPNL 18GX3.5 QUINCKE PK (NEEDLE) ×2 IMPLANT
NEEDLE SPNL 18GX3.5 QUINCKE PK (NEEDLE) ×1 IMPLANT
NS IRRIG 1000ML POUR BTL (IV SOLUTION) ×2 IMPLANT
PACK TOTAL JOINT (CUSTOM PROCEDURE TRAY) ×2 IMPLANT
PAD ARMBOARD POSITIONER FOAM (MISCELLANEOUS) ×4 IMPLANT
PAD COLD SHLDR WRAP-ON (PAD) ×2 IMPLANT
PAD NEG PRESSURE SENSATRAC (MISCELLANEOUS) IMPLANT
PIN DRILL HDLS TROCAR 75 4PK (PIN) IMPLANT
SCREW FEMALE HEX FIX 25X2.5 (ORTHOPEDIC DISPOSABLE SUPPLIES) IMPLANT
SET HNDPC FAN SPRY TIP SCT (DISPOSABLE) ×2 IMPLANT
SOLUTION PRONTOSAN WOUND 350ML (IRRIGATION / IRRIGATOR) ×2 IMPLANT
STAPLER SKIN PROX 35W (STAPLE) IMPLANT
STEM POLY PAT PLY 32M KNEE (Knees) IMPLANT
SUCTION TUBE FRAZIER 10FR DISP (SUCTIONS) IMPLANT
SUT ETHILON 2 0 FS 18 (SUTURE) IMPLANT
SUT MNCRL AB 3-0 PS2 27 (SUTURE) IMPLANT
SUT STRATAFIX PDS+ 0 24IN (SUTURE) IMPLANT
SUT VIC AB 0 CT1 27XBRD ANBCTR (SUTURE) ×4 IMPLANT
SUT VIC AB 1 CTX 27 (SUTURE) ×6 IMPLANT
SUT VIC AB 2-0 CT1 TAPERPNT 27 (SUTURE) ×8 IMPLANT
SYR 50ML LL SCALE MARK (SYRINGE) ×4 IMPLANT
TOWEL GREEN STERILE (TOWEL DISPOSABLE) ×2 IMPLANT
TOWEL GREEN STERILE FF (TOWEL DISPOSABLE) ×2 IMPLANT
TRAY CATH INTERMITTENT SS 16FR (CATHETERS) IMPLANT
TUBE SUCT ARGYLE STRL (TUBING) ×2 IMPLANT
UNDERPAD 30X36 HEAVY ABSORB (UNDERPADS AND DIAPERS) ×2 IMPLANT
WARMER LAPAROSCOPE (MISCELLANEOUS) IMPLANT
YANKAUER SUCT BULB TIP NO VENT (SUCTIONS) ×2 IMPLANT

## 2023-08-29 NOTE — Discharge Instructions (Signed)
 INSTRUCTIONS AFTER JOINT REPLACEMENT   Remove items at home which could result in a fall. This includes throw rugs or furniture in walking pathways ICE to the affected joint every three hours while awake for 30 minutes at a time, for at least the first 3-5 days, and then as needed for pain and swelling.  Continue to use ice for pain and swelling. You may notice swelling that will progress down to the foot and ankle.  This is normal after surgery.  Elevate your leg when you are not up walking on it.   Continue to use the breathing machine you got in the hospital (incentive spirometer) which will help keep your temperature down.  It is common for your temperature to cycle up and down following surgery, especially at night when you are not up moving around and exerting yourself.  The breathing machine keeps your lungs expanded and your temperature down.   DIET:  As you were doing prior to hospitalization, we recommend a well-balanced diet.  DRESSING / WOUND CARE / SHOWERING  DO NOT REMOVE WOUND VAC.  DO NOT GET WOUND VAC WET.  FOLLOW UP IN THE OFFICE IN ONE WEEK TO HAVE THIS REMOVED.  ACTIVITY  Increase activity slowly as tolerated, but follow the weight bearing instructions below.   No driving for 6 weeks or until further direction given by your physician.  You cannot drive while taking narcotics.  No lifting or carrying greater than 10 lbs. until further directed by your surgeon. Avoid periods of inactivity such as sitting longer than an hour when not asleep. This helps prevent blood clots.  You may return to work once you are authorized by your doctor.     WEIGHT BEARING   Weight bearing as tolerated with assist device (walker, cane, etc) as directed, use it as long as suggested by your surgeon or therapist, typically at least 4-6 weeks.   EXERCISES  Results after joint replacement surgery are often greatly improved when you follow the exercise, range of motion and muscle strengthening  exercises prescribed by your doctor. Safety measures are also important to protect the joint from further injury. Any time any of these exercises cause you to have increased pain or swelling, decrease what you are doing until you are comfortable again and then slowly increase them. If you have problems or questions, call your caregiver or physical therapist for advice.   Rehabilitation is important following a joint replacement. After just a few days of immobilization, the muscles of the leg can become weakened and shrink (atrophy).  These exercises are designed to build up the tone and strength of the thigh and leg muscles and to improve motion. Often times heat used for twenty to thirty minutes before working out will loosen up your tissues and help with improving the range of motion but do not use heat for the first two weeks following surgery (sometimes heat can increase post-operative swelling).   These exercises can be done on a training (exercise) mat, on the floor, on a table or on a bed. Use whatever works the best and is most comfortable for you.    Use music or television while you are exercising so that the exercises are a pleasant break in your day. This will make your life better with the exercises acting as a break in your routine that you can look forward to.   Perform all exercises about fifteen times, three times per day or as directed.  You should exercise both  the operative leg and the other leg as well.  Exercises include:   Quad Sets - Tighten up the muscle on the front of the thigh (Quad) and hold for 5-10 seconds.   Straight Leg Raises - With your knee straight (if you were given a brace, keep it on), lift the leg to 60 degrees, hold for 3 seconds, and slowly lower the leg.  Perform this exercise against resistance later as your leg gets stronger.  Leg Slides: Lying on your back, slowly slide your foot toward your buttocks, bending your knee up off the floor (only go as far as is  comfortable). Then slowly slide your foot back down until your leg is flat on the floor again.  Angel Wings: Lying on your back spread your legs to the side as far apart as you can without causing discomfort.  Hamstring Strength:  Lying on your back, push your heel against the floor with your leg straight by tightening up the muscles of your buttocks.  Repeat, but this time bend your knee to a comfortable angle, and push your heel against the floor.  You may put a pillow under the heel to make it more comfortable if necessary.   A rehabilitation program following joint replacement surgery can speed recovery and prevent re-injury in the future due to weakened muscles. Contact your doctor or a physical therapist for more information on knee rehabilitation.    CONSTIPATION  Constipation is defined medically as fewer than three stools per week and severe constipation as less than one stool per week.  Even if you have a regular bowel pattern at home, your normal regimen is likely to be disrupted due to multiple reasons following surgery.  Combination of anesthesia, postoperative narcotics, change in appetite and fluid intake all can affect your bowels.   YOU MUST use at least one of the following options; they are listed in order of increasing strength to get the job done.  They are all available over the counter, and you may need to use some, POSSIBLY even all of these options:    Drink plenty of fluids (prune juice may be helpful) and high fiber foods Colace 100 mg by mouth twice a day  Senokot for constipation as directed and as needed Dulcolax (bisacodyl), take with full glass of water   Miralax  (polyethylene glycol) once or twice a day as needed.  If you have tried all these things and are unable to have a bowel movement in the first 3-4 days after surgery call either your surgeon or your primary doctor.    If you experience loose stools or diarrhea, hold the medications until you stool forms back  up.  If your symptoms do not get better within 1 week or if they get worse, check with your doctor.  If you experience the worst abdominal pain ever or develop nausea or vomiting, please contact the office immediately for further recommendations for treatment.   ITCHING:  If you experience itching with your medications, try taking only a single pain pill, or even half a pain pill at a time.  You can also use Benadryl over the counter for itching or also to help with sleep.   TED HOSE STOCKINGS:  Use stockings on both legs until for at least 2 weeks or as directed by physician office. They may be removed at night for sleeping.  MEDICATIONS:  See your medication summary on the "After Visit Summary" that nursing will review with you.  You may have  some home medications which will be placed on hold until you complete the course of blood thinner medication.  It is important for you to complete the blood thinner medication as prescribed.  PRECAUTIONS:  If you experience chest pain or shortness of breath - call 911 immediately for transfer to the hospital emergency department.   If you develop a fever greater that 101 F, purulent drainage from wound, increased redness or drainage from wound, foul odor from the wound/dressing, or calf pain - CONTACT YOUR SURGEON.                                                   FOLLOW-UP APPOINTMENTS:  If you do not already have a post-op appointment, please call the office for an appointment to be seen by your surgeon.  Guidelines for how soon to be seen are listed in your "After Visit Summary", but are typically between 1-4 weeks after surgery.  OTHER INSTRUCTIONS:   Knee Replacement:  Do not place pillow under knee, focus on keeping the knee straight while resting. CPM instructions: 0-90 degrees, 2 hours in the morning, 2 hours in the afternoon, and 2 hours in the evening. Place foam block, curve side up under heel at all times except when in CPM or when walking.  DO  NOT modify, tear, cut, or change the foam block in any way.  POST-OPERATIVE OPIOID TAPER INSTRUCTIONS: It is important to wean off of your opioid medication as soon as possible. If you do not need pain medication after your surgery it is ok to stop day one. Opioids include: Codeine, Hydrocodone (Norco, Vicodin), Oxycodone (Percocet, oxycontin ) and hydromorphone  amongst others.  Long term and even short term use of opiods can cause: Increased pain response Dependence Constipation Depression Respiratory depression And more.  Withdrawal symptoms can include Flu like symptoms Nausea, vomiting And more Techniques to manage these symptoms Hydrate well Eat regular healthy meals Stay active Use relaxation techniques(deep breathing, meditating, yoga) Do Not substitute Alcohol  to help with tapering If you have been on opioids for less than two weeks and do not have pain than it is ok to stop all together.  Plan to wean off of opioids This plan should start within one week post op of your joint replacement. Maintain the same interval or time between taking each dose and first decrease the dose.  Cut the total daily intake of opioids by one tablet each day Next start to increase the time between doses. The last dose that should be eliminated is the evening dose.   Per [facility] clinic policy, our goal is ensure optimal postoperative pain control with a multimodal pain management strategy. For all [facility] patients, our goal is to wean post-operative narcotic medications by 6 weeks post-operatively. If this is not possible due to utilization of pain medication prior to surgery, your [facility] doctor will support your acute post-operative pain control for the first 6 weeks postoperatively, with a plan to transition you back to your primary pain team following that. [facility] will work to ensure a Therapist, occupational.   MAKE SURE YOU:  Understand these instructions.  Get help right away if you are not  doing well or get worse.    Thank you for letting us  be a part of your medical care team.  It is a privilege we respect greatly.  We hope these  instructions will help you stay on track for a fast and full recovery!

## 2023-08-29 NOTE — Op Note (Addendum)
 Total Knee Arthroplasty Procedure Note  Preoperative diagnosis: Right knee osteoarthritis  Postoperative diagnosis:same  Operative findings: Complete loss articular cartilage from all 3 compartments Preop ROM 15-90 degrees Postop ROM 0-115 degrees  Operative procedure:  Right total knee arthroplasty. CPT [phone] Application of incisional VAC  Surgeon: N. Ozell Cummins, MD  Assist: Ronal Morna Grave, PA-C; necessary for the timely completion of procedure and due to complexity of procedure.  Anesthesia: general, regional, local  Tourniquet time: see anesthesia record  Implants used: Zimmer persona Femur: CR 7 Tibia: D Patella: 32 mm Polyethylene: 10 mm medial congruent  Indication: [name] is a 63 y.o. year old female with a history of knee pain. Having failed conservative management, the patient elected to proceed with a total knee arthroplasty.  We have reviewed the risk and benefits of the surgery and they elected to proceed after voicing understanding.  Procedure:  After informed consent was obtained and understanding of the risk were voiced including but not limited to bleeding, infection, damage to surrounding structures including nerves and vessels, blood clots, leg length inequality and the failure to achieve desired results, the operative extremity was marked with verbal confirmation of the patient in the holding area.   The patient was then brought to the operating room and transported to the operating room table in the supine position.  A tourniquet was applied to the operative extremity around the upper thigh. The operative limb was then prepped and draped in the usual sterile fashion and preoperative antibiotics were administered.  A time out was performed prior to the start of surgery confirming the correct extremity, preoperative antibiotic administration, as well as team members, implants and instruments available for the case. Correct surgical site was  also confirmed with preoperative radiographs. The limb was then elevated for exsanguination and the tourniquet was inflated. A midline incision was made and a standard medial parapatellar approach was performed.  The infrapatellar fat pad was removed.  Suprapatellar synovium was removed to reveal the anterior distal femoral cortex.  A medial peel was performed to release the capsule and the deep MCL off of the medial tibial plateau back to the semimembranosus.  The patella was then everted which showed complete loss of articular cartilage and was resected down to 14 mm and sized to a 32 mm.  A cover was placed on the patella for protection from retractors.  The knee was then brought into flexion and we then turned our attention to the femur.  The ACL was sacrificed.  Start site was drilled in the femur and the intramedullary distal femoral cutting guide was placed, set at 5 degrees valgus, taking 12 mm of distal resection to account for the flexion contracture. The distal cut was made. Osteophytes were then removed.  Next, the proximal tibial cutting guide was placed with appropriate slope, varus/valgus alignment and depth of resection.  The drop rod was attached to confirm that it was aimed at the second metatarsal.  The proximal tibial cut was made taking 4 mm off the low side. Gap blocks were then used to assess the extension gap and alignment, and appropriate soft tissue releases were performed. Attention was turned back to the femur, which was sized using the sizing guide to a size 7. Appropriate rotation of the femoral component was determined using epicondylar axis, Whiteside's line, and assessing the flexion gap under ligament tension. The appropriate size 4-in-1 cutting block was placed and checked with an angel wing and cuts were made. Posterior  femoral osteophytes and uncapped bone were then removed with the curved osteotome.  The menisci were removed.  Trial components were placed, and stability was  checked in full extension, mid-flexion, and deep flexion.  PCL was resected. Proper tibial rotation was determined and marked.  The patella tracked well without a lateral release.  The femoral lugs were then drilled. Trial components were then removed and tibial preparation performed.  The trial tibia was pointed to the medial third of the tibial tubercle.  The tibia was sized for a size D component and prepared.  Trial components were removed.    The bony surfaces were irrigated with a pulse lavage and then dried. Bone cement was vacuum mixed on the back table, and the final components sized above were cemented into place.  Antibiotic irrigation was placed in the knee joint and soft tissues while the cement cured.  After cement had finished curing, excess cement was removed. The stability of the construct was re-evaluated throughout a range of motion and found to be acceptable. The trial liner was removed, the knee was copiously irrigated, and the knee was re-evaluated for any excess bone debris. The real polyethylene liner, 10 mm thick, was inserted and checked to ensure the locking mechanism had engaged appropriately. The tourniquet was deflated and hemostasis was achieved. The wound was irrigated with normal saline.  One gram of vancomycin  powder was placed in the surgical bed.  Topical mixture of 0.25% bupivacaine  and meloxicam  was placed in the joint for postoperative pain.  Medium HVAC drain was placed in the joint.  Capsular closure was performed with a #1 stratafix in flexion, subcutaneous fat closed with a 0 vicryl suture, then subcutaneous tissue closed with interrupted 2.0 vicryl suture. The skin was then closed with a 2.0 nylon and incisional VAC. A sterile dressing was applied.  The patient was awakened in the operating room and taken to recovery in stable condition. All sponge, needle, and instrument counts were correct at the end of the case.  Morna Grave was necessary for opening, closing,  retracting, limb positioning and overall facilitation and completion of the surgery.  Position: supine  Complications: none.  Time Out: performed   Drains/Packing: none Estimated blood loss: minimal Returned to Recovery Room: in good condition.   Mechanical VTE (DVT) Prophylaxis: sequential compression devices, TED thigh-high  Chemical VTE (DVT) Prophylaxis: eliquis  POD 1  Fluid Replacement  Crystalloid: see anesthesia record Blood: none  FFP: none   Specimens Removed: 1 to pathology  Sponge and Instrument Count Correct? yes  PACU: portable radiograph - knee AP and Lateral  Plan/RTC: Return in 2 weeks for suture removal.  Weight Bearing/Load Lower Extremity: full   Implant Name Type Inv. Item Serial No. Manufacturer Lot No. LRB No. Used Action  CEMENT BONE REFOBACIN [id number] US  - [id number] Cement CEMENT BONE REFOBACIN R1X40 US   ZIMMER RECON(ORTH,TRAU,BIO,SG) [id number] Right 1 Implanted  CEMENT BONE REFOBACIN R1X40 US  - [id number] Cement CEMENT BONE REFOBACIN R1X40 US   ZIMMER RECON(ORTH,TRAU,BIO,SG) [id number] Right 1 Implanted  COMPONENT TIB CMT PS D 0D RT - [id number] Joint COMPONENT TIB CMT PS D 0D RT  ZIMMER RECON(ORTH,TRAU,BIO,SG) [id number] Right 1 Implanted  COMPONENT FEM CMT PRSONA SZ7RT - [id number] Joint COMPONENT FEM CMT PRSONA SZ7RT  ZIMMER RECON(ORTH,TRAU,BIO,SG) [id number] Right 1 Implanted  STEM POLY PAT PLY 58M KNEE - [id number] Knees STEM POLY PAT PLY 58M KNEE  ZIMMER RECON(ORTH,TRAU,BIO,SG) [id number] Right 1 Implanted  LINER ASF PERS 10X6/7 CD RT - [id number] Liner LINER ASF PERS 10X6/7  CD RT  ZIMMER RECON(ORTH,TRAU,BIO,SG) [id number] Right 1 Implanted    N. Ozell Cummins, MD [facility] 12:16 PM

## 2023-08-29 NOTE — Anesthesia Postprocedure Evaluation (Signed)
 Anesthesia Post Note  Patient: KALY MCQUARY  Procedure(s) Performed: ARTHROPLASTY, KNEE, TOTAL (Right: Knee)     Patient location during evaluation: PACU Anesthesia Type: Regional, Spinal and MAC Level of consciousness: awake and alert and oriented Pain management: pain level controlled Vital Signs Assessment: post-procedure vital signs reviewed and stable Respiratory status: spontaneous breathing, nonlabored ventilation and respiratory function stable Cardiovascular status: blood pressure returned to baseline and stable Postop Assessment: no headache, no backache, spinal receding and no apparent nausea or vomiting Anesthetic complications: no   No notable events documented.  Last Vitals:  Vitals:   08/29/23 1445 08/29/23 1500  BP: 117/73 (!) 108/91  Pulse: (!) 55 (!) 54  Resp: 16 12  Temp: (!) 36.3 C   SpO2: 100% 100%    Last Pain:  Vitals:   08/29/23 1500  TempSrc:   PainSc: Asleep                 Almarie CHRISTELLA Marchi

## 2023-08-29 NOTE — Evaluation (Signed)
 Physical Therapy Evaluation Patient Details Name: [name] MRN: [id number] DOB: 05-17-1960 Today's Date: 08/29/2023  History of Present Illness  Pt is 63 year old presented to [facility] on  08/29/23 for rt TKR. PMH - multiple back surgeries, rt rotator cuff repair, htn, sleep apnea, DM, arthritis  Clinical Impression  Pt presents to PT after rt TKR. Pt very loopy after anesthesia/meds. Difficulty processing. Was able to stand and side step up EOB with assist. Once pt clears up expect she will make good progress with all mobility.         If plan is discharge home, recommend the following: A little help with walking and/or transfers;A little help with bathing/dressing/bathroom;Assist for transportation;Help with stairs or ramp for entrance   Can travel by private vehicle        Equipment Recommendations Rolling walker (2 wheels)  Recommendations for Other Services       Functional Status Assessment Patient has had a recent decline in their functional status and demonstrates the ability to make significant improvements in function in a reasonable and predictable amount of time.     Precautions / Restrictions Precautions Precautions: Fall;Knee;Other (comment) Precaution/Restrictions Comments: Wound VAC, hemovac Restrictions Weight Bearing Restrictions Per Provider Order: Yes RLE Weight Bearing Per Provider Order: Weight bearing as tolerated      Mobility  Bed Mobility Overal bed mobility: Needs Assistance Bed Mobility: Supine to Sit, Sit to Supine     Supine to sit: Min assist, HOB elevated Sit to supine: Min assist   General bed mobility comments: Assist to bring RLE off and on bed    Transfers Overall transfer level: Needs assistance Equipment used: Rolling walker (2 wheels) Transfers: Sit to/from Stand Sit to Stand: Mod assist, +2 safety/equipment           General transfer comment: Assist to power up.    Ambulation/Gait             Pre-gait  activities: Side stepped up side of bed with +2 min assist    Stairs            Wheelchair Mobility     Tilt Bed    Modified Rankin (Stroke Patients Only)       Balance Overall balance assessment: Needs assistance Sitting-balance support: No upper extremity supported, Feet supported Sitting balance-Leahy Scale: Good     Standing balance support: Bilateral upper extremity supported, Reliant on assistive device for balance Standing balance-Leahy Scale: Poor Standing balance comment: walker and CGA for static standing                             Pertinent Vitals/Pain Pain Assessment Pain Assessment: Faces Faces Pain Scale: Hurts little more Pain Location: rt knee Pain Descriptors / Indicators: Guarding, Grimacing Pain Intervention(s): Limited activity within patient's tolerance, Monitored during session, Premedicated before session, Repositioned, Ice applied    Home Living Family/patient expects to be discharged to:: Private residence Living Arrangements: Children   Type of Home: House Home Access: Stairs to enter   Secretary/administrator of Steps: 2   Home Layout: One level        Prior Function Prior Level of Function : Patient poor historian/Family not available                     Extremity/Trunk Assessment   Upper Extremity Assessment Upper Extremity Assessment: Defer to OT evaluation    Lower Extremity Assessment Lower Extremity  Assessment: RLE deficits/detail RLE Deficits / Details: Knee ROM grossly 5-80 degrees. knee extension 3/5    Cervical / Trunk Assessment Cervical / Trunk Assessment: Back Surgery (history)  Communication   Communication Communication: Impaired Factors Affecting Communication: Difficulty expressing self    Cognition Arousal: Alert, Suspect due to medications (once aroused) Behavior During Therapy: Flat affect   PT - Cognitive impairments: Other                       PT - Cognition  Comments: Pt very loopy and unable to stay on task or answer questions appropriately. Assume general anesthesia and medicines causing. Following commands: Impaired Following commands impaired: Follows one step commands inconsistently, Follows one step commands with increased time     Cueing Cueing Techniques: Verbal cues, Gestural cues, Tactile cues     General Comments      Exercises     Assessment/Plan    PT Assessment Patient needs continued PT services  PT Problem List Decreased strength;Decreased range of motion;Decreased activity tolerance;Decreased balance;Decreased mobility;Pain       PT Treatment Interventions DME instruction;Gait training;Stair training;Functional mobility training;Therapeutic activities;Therapeutic exercise;Balance training;Patient/family education    PT Goals (Current goals can be found in the Care Plan section)  Acute Rehab PT Goals Patient Stated Goal: not stated PT Goal Formulation: With patient Time For Goal Achievement: 09/05/23 Potential to Achieve Goals: Good    Frequency 7X/week     Co-evaluation               AM-PAC PT 6 Clicks Mobility  Outcome Measure Help needed turning from your back to your side while in a flat bed without using bedrails?: A Little Help needed moving from lying on your back to sitting on the side of a flat bed without using bedrails?: A Little Help needed moving to and from a bed to a chair (including a wheelchair)?: A Lot Help needed standing up from a chair using your arms (e.g., wheelchair or bedside chair)?: A Lot Help needed to walk in hospital room?: Total Help needed climbing 3-5 steps with a railing? : Total 6 Click Score: 12    End of Session Equipment Utilized During Treatment: Gait belt Activity Tolerance: Other (comment) (limited by cognition) Patient left: in bed;with call bell/phone within reach;with bed alarm set;with nursing/sitter in room Nurse Communication: Mobility status (Nurse  present and assisting) PT Visit Diagnosis: Other abnormalities of gait and mobility (R26.89);Pain;Difficulty in walking, not elsewhere classified (R26.2) Pain - Right/Left: Right Pain - part of body: Knee    Time: 8355-8340 PT Time Calculation (min) (ACUTE ONLY): 15 min   Charges:   PT Evaluation $PT Eval Low Complexity: 1 Low   PT General Charges $$ ACUTE PT VISIT: 1 Visit         [facility] PT Acute Rehabilitation Services Office [phone]   Rodgers ORN [facility] 08/29/2023, 6:24 PM

## 2023-08-29 NOTE — Progress Notes (Signed)
 Orthopedic Tech Progress Note Patient Details:  [name] 05/27/1960 [id number]  Ortho Devices Type of Ortho Device: Bone foam zero knee Ortho Device/Splint Interventions: Ordered, Application, Removal   Post Interventions Patient Tolerated: Well Instructions Provided: Care of device  [name] Pac 08/29/2023, 1:18 PM

## 2023-08-29 NOTE — H&P (Signed)
 PREOPERATIVE H&P  Chief Complaint: osteoarthritis of right knee  HPI: [name] is a 63 y.o. female who presents for surgical treatment of osteoarthritis of right knee.  She denies any changes in medical history.  Past Surgical History:  Procedure Laterality Date   ABDOMINAL HYSTERECTOMY     uterine prolapse   BACK SURGERY     L4-5   CARDIAC CATHETERIZATION  2007   CATARACT EXTRACTION, BILATERAL  2023   CHOLECYSTECTOMY     COLONOSCOPY     ESOPHAGOGASTRODUODENOSCOPY  5-24yrs ago   FOOT SURGERY Bilateral    KNEE ARTHROSCOPY Right    LAMINECTOMY THORACIC SPINE W/ PLACEMENT SPINAL CORD STIMULATOR     LAPAROSCOPIC GASTRIC SLEEVE RESECTION N/A 08/23/2016   Procedure: LAPAROSCOPIC GASTRIC SLEEVE RESECTION WITH UPPER ENDO;  Surgeon: Gladis Cough, MD;  Location: [facility];  Service: General;  Laterality: N/A;   POSTERIOR LUMBAR FUSION  03/10/2009   L3-4 and L3-4 fusion, Dr. Catalina Stains   SHOULDER ARTHROSCOPY WITH ROTATOR CUFF REPAIR AND SUBACROMIAL DECOMPRESSION Right 08/23/2013   Procedure: RIGHT SHOULDER ARTHROSCOPY WITH  SUBACROMIAL DECOMPRESSION DISTAL CLAVICLE RESECTION AND  ROTATOR CUFF REPAIR ;  Surgeon: Franky CHRISTELLA Pointer, MD;  Location: [facility] OR;  Service: Orthopedics;  Laterality: Right;   TUBAL LIGATION     Social History   Socioeconomic History   Marital status: Divorced    Spouse name: Not on file   Number of children: 2   Years of education: College   Highest education level: Not on file  Occupational History    Employer: [employer]    Comment: [employer]  Tobacco Use   Smoking status: Former    Current packs/day: 0.00    Types: Cigarettes    Quit date: 1993    Years since quitting: 32.6   Smokeless tobacco: Never   Tobacco comments:    quit smoking in 1993  Vaping Use   Vaping status: Never Used  Substance and Sexual Activity   Alcohol  use: Yes    Comment: rarely; mixed drink   Drug use: No   Sexual activity: Not Currently    Partners: Male    Birth  control/protection: Surgical    Comment: Hysterectomy  Other Topics Concern   Not on file  Social History Narrative   Caffeine: Coffee   Social Drivers of Corporate investment banker Strain: Not on file  Food Insecurity: Not on file  Transportation Needs: Not on file  Physical Activity: Not on file  Stress: Not on file  Social Connections: Unknown (09/11/2021)   Received from [employer]   Social Network    Social Network: Not on file   Family History  Problem Relation Age of Onset   Hypertension Mother    Dementia Mother    Alzheimer's disease Mother    Hypertension Father    Hypertension Sister    Kidney disease Sister        KIDNEL TRANSPLANT   Thyroid  disease Sister    Thyroid  disease Sister    Hypertension Sister    Breast cancer Maternal Aunt        >50; passed away from it   Breast cancer Maternal Aunt        >50   Allergies  Allergen Reactions   Flagyl  [Metronidazole ] Other (See Comments)    Developed pancreatitis   Adhesive [Tape] Itching and Rash   Crestor [Rosuvastatin] Rash   Latex Itching and Rash   Penicillins Rash   Ultram  [Tramadol  Hcl]  Swelling and Rash    eye edema   Vesicare [Solifenacin] Rash   Prior to Admission medications   Medication Sig Start Date End Date Taking? Authorizing Provider  acetaminophen  (TYLENOL ) 500 MG tablet Take 500-1,000 mg by mouth 2 (two) times daily as needed for fever, headache or moderate pain (pain score 4-6).   Yes [provider]  albuterol  (PROVENTIL  HFA;VENTOLIN  HFA) 108 (90 BASE) MCG/ACT inhaler Inhale 1-2 puffs into the lungs every 6 (six) hours as needed for wheezing or shortness of breath. 10/09/14  Yes Croitoru, Mihai, MD  aspirin  EC 81 MG tablet Take 81 mg by mouth at bedtime.   Yes [provider]  azelastine  (ASTELIN ) 0.1 % nasal spray PLACE 1 SPRAY PER NOSTRIL ONCE DAILY AT NIGHT Patient taking differently: Place 1-2 sprays into both nostrils daily as needed for allergies. 01/11/22   Yes   diazepam  (VALIUM ) 5 MG tablet Take 1 tablet by mouth once daily as needed for neck pain Patient taking differently: Take 5 mg by mouth at bedtime as needed for muscle spasms (sleep). 05/11/21  Yes   donepezil  (ARICEPT ) 5 MG tablet Take 1 tablet (5 mg total) by mouth at bedtime. Patient taking differently: Take 5 mg by mouth daily. 06/14/23  Yes Buck Saucer, MD  esomeprazole  (NEXIUM ) 40 MG capsule Take 1 capsule (40 mg total) by mouth daily. 05/03/23  Yes   fluticasone  (FLONASE ) 50 MCG/ACT nasal spray PLACE 2 SPRAYS IN EACH NOSTRIL DAILY AS DIRECTED Patient taking differently: Place 1-2 sprays into both nostrils daily as needed for allergies. 12/18/19 10/01/23 Yes Larnell Hamilton, MD  gabapentin  (NEURONTIN ) 300 MG capsule Take 1 capsule (300 mg total) by mouth 3 (three) times daily. Patient taking differently: Take 300 mg by mouth See admin instructions. Take 1 capsule (300mg ) by mouth in the morning and take 1 capsule at night if needed for pain or neuropathy. 05/18/23  Yes Williams, Megan E, NP  levothyroxine  (SYNTHROID ) 75 MCG tablet TAKE 1 TABLET BY MOUTH EVERY MORNING ON AN EMPTY STOMACH 12/17/22  Yes   loratadine (CLARITIN) 10 MG tablet Take 10 mg by mouth daily.   Yes [provider]  metoprolol  tartrate (LOPRESSOR ) 25 MG tablet Take 1 tablet (25 mg total) by mouth 2 (two) times daily. 03/07/23  Yes Croitoru, Mihai, MD  nystatin  (MYCOSTATIN /NYSTOP ) powder Apply topically 2 (two) times daily as directed. Patient taking differently: Apply 1 Application topically 2 (two) times daily as needed (skin irritation). 06/14/23  Yes   olmesartan  (BENICAR ) 20 MG tablet Take 1 tablet (20 mg total) by mouth daily. 05/03/23  Yes   baclofen  (LIORESAL ) 10 MG tablet Take 0.5-1 tablets (5-10 mg total) by mouth every 8 (eight) hours as needed for muscle spasms Patient not taking: Reported on 08/17/2023 10/29/22   Barbarann Oneil BROCKS, MD  celecoxib  (CELEBREX ) 200 MG capsule Take 1 capsule (200 mg total) by mouth 2  (two) times daily. Patient not taking: Reported on 08/17/2023 11/03/22   Barbarann Oneil BROCKS, MD  docusate sodium  (COLACE) 100 MG capsule Take 1 capsule (100 mg total) by mouth daily as needed. 08/22/23 08/21/24  Jule Ronal CROME, PA-C  doxycycline  (VIBRA -TABS) 100 MG tablet Take 1 tablet (100 mg total) by mouth 2 (two) times daily. To be taken after surgery 08/22/23   Jule Ronal CROME, PA-C  meloxicam  (MOBIC ) 15 MG tablet Take 1 tablet (15 mg total) by mouth daily. Patient not taking: Reported on 08/17/2023 05/03/23     methocarbamol  (ROBAXIN ) 500 MG tablet Take  1 tablet (500 mg total) by mouth 2 (two) times daily as needed. 08/22/23   Jule Ronal CROME, PA-C  ondansetron  (ZOFRAN ) 4 MG tablet Take 1 tablet (4 mg total) by mouth every 8 (eight) hours as needed for nausea or vomiting. 08/22/23   Jule Ronal CROME, PA-C  oxyCODONE -acetaminophen  (PERCOCET) 5-325 MG tablet Take 1-2 tablets by mouth every 6 (six) hours as needed. To be taken after surgery 08/22/23   Jule Ronal CROME, PA-C     Positive ROS: All other systems have been reviewed and were otherwise negative with the exception of those mentioned in the HPI and as above.  Physical Exam: General: Alert, no acute distress Cardiovascular: No pedal edema Respiratory: No cyanosis, no use of accessory musculature GI: abdomen soft Skin: No lesions in the area of chief complaint Neurologic: Sensation intact distally Psychiatric: Patient is competent for consent with normal mood and affect Lymphatic: no lymphedema  MUSCULOSKELETAL: exam stable  Assessment: osteoarthritis of right knee  Plan: Plan for Procedure(s): ARTHROPLASTY, KNEE, TOTAL  The risks benefits and alternatives were discussed with the patient including but not limited to the risks of nonoperative treatment, versus surgical intervention including infection, bleeding, nerve injury,  blood clots, cardiopulmonary complications, morbidity, mortality, among others, and they were willing to  proceed.   Ozell Cummins, MD 08/29/2023 6:27 AM

## 2023-08-29 NOTE — Transfer of Care (Signed)
 Immediate Anesthesia Transfer of Care Note  Patient: [name]  Procedure(s) Performed: ARTHROPLASTY, KNEE, TOTAL (Right: Knee)  Patient Location: PACU  Anesthesia Type:GA combined with regional for post-op pain  Level of Consciousness: awake, alert , oriented, drowsy, and patient cooperative  Airway & Oxygen Therapy: Patient Spontanous Breathing  Post-op Assessment: Report given to RN and Post -op Vital signs reviewed and stable  Post vital signs: Reviewed and stable  Last Vitals:  Vitals Value Taken Time  BP 110/78 08/29/23 12:52  Temp    Pulse 87 08/29/23 12:55  Resp 12 08/29/23 12:55  SpO2 96 % 08/29/23 12:55  Vitals shown include unfiled device data.  Last Pain:  Vitals:   08/29/23 0757  TempSrc:   PainSc: 0-No pain         Complications: No notable events documented.

## 2023-08-29 NOTE — Anesthesia Procedure Notes (Signed)
 Procedure Name: Intubation Date/Time: 08/29/2023 10:34 AM  Performed by: Cindie Donald CROME, CRNAPre-anesthesia Checklist: Patient identified, Emergency Drugs available, Suction available and Patient being monitored Patient Re-evaluated:Patient Re-evaluated prior to induction Oxygen Delivery Method: Circle System Utilized Preoxygenation: Pre-oxygenation with 100% oxygen Induction Type: IV induction Ventilation: Mask ventilation without difficulty Laryngoscope Size: Mac and 3 Grade View: Grade I Tube type: Oral Tube size: 7.0 mm Number of attempts: 1 Airway Equipment and Method: Stylet Placement Confirmation: ETT inserted through vocal cords under direct vision, positive ETCO2 and breath sounds checked- equal and bilateral Secured at: 20 cm Tube secured with: Tape Dental Injury: Teeth and Oropharynx as per pre-operative assessment

## 2023-08-29 NOTE — Progress Notes (Signed)
    Durable Medical Equipment  (From admission, onward)           Start     Ordered   08/29/23 1632  DME Bedside commode  Once       Comments: Confine to one room  Question:  Patient needs a bedside commode to treat with the following condition  Answer:  Status post left partial knee replacement   08/29/23 1632   08/29/23 1533  DME Walker rolling  Once       Question Answer Comment  Walker: With 5 Inch Wheels   Patient needs a walker to treat with the following condition Status post left partial knee replacement      08/29/23 1532   08/29/23 1533  DME 3 n 1  Once        08/29/23 1532

## 2023-08-30 ENCOUNTER — Other Ambulatory Visit (HOSPITAL_COMMUNITY): Payer: Self-pay

## 2023-08-30 ENCOUNTER — Institutional Professional Consult (permissible substitution): Admitting: Plastic Surgery

## 2023-08-30 ENCOUNTER — Telehealth: Payer: Self-pay

## 2023-08-30 ENCOUNTER — Other Ambulatory Visit: Payer: Self-pay

## 2023-08-30 ENCOUNTER — Encounter (HOSPITAL_COMMUNITY): Payer: Self-pay | Admitting: Orthopaedic Surgery

## 2023-08-30 DIAGNOSIS — M1711 Unilateral primary osteoarthritis, right knee: Secondary | ICD-10-CM | POA: Diagnosis not present

## 2023-08-30 DIAGNOSIS — Z96651 Presence of right artificial knee joint: Secondary | ICD-10-CM

## 2023-08-30 MED ORDER — DOXYCYCLINE HYCLATE 100 MG PO TABS
100.0000 mg | ORAL_TABLET | Freq: Two times a day (BID) | ORAL | 0 refills | Status: AC
  Filled 2023-08-30 (×4): qty 56, 28d supply, fill #0

## 2023-08-30 NOTE — Telephone Encounter (Signed)
 yes

## 2023-08-30 NOTE — Telephone Encounter (Signed)
 Out patient to Inpatient request  [facility]   [phone] Confidential vm

## 2023-08-30 NOTE — Progress Notes (Signed)
 Physical Therapy Treatment Patient Details Name: [name] MRN: [id number] DOB: [birth year] Today's Date: 08/30/2023   History of Present Illness Pt is 63 year old presented to [facility] on  08/29/23 for rt TKR. PMH - multiple back surgeries, rt rotator cuff repair, htn, sleep apnea, DM, arthritis    PT Comments  Pt resting in bed on arrival, pleasant and agreeable to session and demonstrating good progress towards acute goals. Pt awake and alert throughout session and giving good effort for transfer and gait training. Pt demonstrating bed mobility and transfers at grossly supervision level with light cues for safety awareness as pt attempting to stand prior to RW placed in front of pt. Pt demonstrating gait with CGA for safety and RW for support with distance limited by pain and fatigue. HEP issued and reviewed with pt verbally and/or demonstrating understanding. Pt was educated on continued walker use to maximize functional independence, safety, and decrease risk for falls. Plan to progress stair training next session for increased safety with mobility post acutely. Pt continues to benefit from skilled PT services to progress toward functional mobility goals.  Pt continues to benefit from skilled PT services to progress toward functional mobility goals.      If plan is discharge home, recommend the following: A little help with walking and/or transfers;A little help with bathing/dressing/bathroom;Assist for transportation;Help with stairs or ramp for entrance   Can travel by private vehicle        Equipment Recommendations  Rolling walker (2 wheels)    Recommendations for Other Services       Precautions / Restrictions Precautions Precautions: Fall;Knee;Other (comment) Precaution/Restrictions Comments: Wound VAC, hemovac Restrictions Weight Bearing Restrictions Per Provider Order: Yes RLE Weight Bearing Per Provider Order: Weight bearing as tolerated     Mobility  Bed Mobility Overal  bed mobility: Needs Assistance Bed Mobility: Supine to Sit     Supine to sit: HOB elevated, Supervision, Used rails     General bed mobility comments: supervision for safety, pt exiting L side of bed.    Transfers Overall transfer level: Needs assistance Equipment used: Rolling walker (2 wheels) Transfers: Sit to/from Stand Sit to Stand: Supervision           General transfer comment: supervision for safety, cues to stand to RW as pt initially standing without RW support    Ambulation/Gait Ambulation/Gait assistance: Contact guard assist Gait Distance (Feet): 75 Feet Assistive device: Rolling walker (2 wheels) Gait Pattern/deviations: Step-to pattern, Decreased step length - right, Decreased stance time - right, Antalgic Gait velocity: decr     General Gait Details: pt ambulating with antalgic step to pattern, CGA for safety with no LOB noted   Stairs             Wheelchair Mobility     Tilt Bed    Modified Rankin (Stroke Patients Only)       Balance Overall balance assessment: Needs assistance Sitting-balance support: No upper extremity supported, Feet supported Sitting balance-Leahy Scale: Good     Standing balance support: Bilateral upper extremity supported, Reliant on assistive device for balance Standing balance-Leahy Scale: Poor Standing balance comment: reliant on RW support for dynamic tasks                            Communication Communication Communication: No apparent difficulties  Cognition Arousal: Alert Behavior During Therapy: WFL for tasks assessed/performed   PT - Cognitive impairments: No apparent impairments  Following commands: Intact      Cueing Cueing Techniques: Verbal cues, Gestural cues, Tactile cues  Exercises Total Joint Exercises Ankle Circles/Pumps: AROM, Both, 10 reps Quad Sets: AROM, Right, 5 reps Heel Slides: AROM, Right, 5 reps Hip ABduction/ADduction: AROM,  Right, 5 reps Straight Leg Raises: AROM, Right, 5 reps    General Comments General comments (skin integrity, edema, etc.): VSS on RA      Pertinent Vitals/Pain Pain Assessment Pain Assessment: Faces Faces Pain Scale: Hurts a little bit Pain Location: rt knee Pain Descriptors / Indicators: Guarding, Grimacing Pain Intervention(s): Premedicated before session, Monitored during session, Limited activity within patient's tolerance    Home Living                          Prior Function            PT Goals (current goals can now be found in the care plan section) Acute Rehab PT Goals Patient Stated Goal: not stated PT Goal Formulation: With patient Time For Goal Achievement: 09/05/23 Progress towards PT goals: Progressing toward goals    Frequency    7X/week      PT Plan      Co-evaluation              AM-PAC PT 6 Clicks Mobility   Outcome Measure  Help needed turning from your back to your side while in a flat bed without using bedrails?: A Little Help needed moving from lying on your back to sitting on the side of a flat bed without using bedrails?: A Little Help needed moving to and from a bed to a chair (including a wheelchair)?: A Little Help needed standing up from a chair using your arms (e.g., wheelchair or bedside chair)?: A Little Help needed to walk in hospital room?: A Little Help needed climbing 3-5 steps with a railing? : A Lot 6 Click Score: 17    End of Session Equipment Utilized During Treatment: Gait belt Activity Tolerance: Patient tolerated treatment well Patient left: in chair;with call bell/phone within reach Nurse Communication: Mobility status PT Visit Diagnosis: Other abnormalities of gait and mobility (R26.89);Pain;Difficulty in walking, not elsewhere classified (R26.2) Pain - Right/Left: Right Pain - part of body: Knee     Time: 0920-0947 PT Time Calculation (min) (ACUTE ONLY): 27 min  Charges:    $Gait  Training: 8-22 mins $Therapeutic Exercise: 8-22 mins PT General Charges $$ ACUTE PT VISIT: 1 Visit                     Kalyn Hofstra R. PTA Acute Rehabilitation Services Office: [phone]   Therisa CHRISTELLA Boor 08/30/2023, 10:29 AM

## 2023-08-30 NOTE — Progress Notes (Addendum)
 Subjective: 1 Day Post-Op Procedure(s) (LRB): ARTHROPLASTY, KNEE, TOTAL (Right) Patient reports pain as mild.  Denies any SOB or chest pain.  Objective: Vital signs in last 24 hours: Temp:  [97.4 F (36.3 C)-98.8 F (37.1 C)] 97.8 F (36.6 C) (08/05 0750) Pulse Rate:  [37-121] 110 (08/05 0750) Resp:  [10-20] 16 (08/05 0750) BP: (94-151)/(53-91) 151/73 (08/05 0750) SpO2:  [81 %-100 %] 95 % (08/05 0750)  Intake/Output from previous day: 08/04 0701 - 08/05 0700 In: 1501.5 [I.V.:1201.5; IV Piggyback:300] Out: 390 [Emesis/NG output:300; Drains:90] Intake/Output this shift: No intake/output data recorded.  No results for input(s): HGB in the last 72 hours. No results for input(s): WBC, RBC, HCT, PLT in the last 72 hours. No results for input(s): NA, K, CL, CO2, BUN, CREATININE, GLUCOSE, CALCIUM  in the last 72 hours. No results for input(s): LABPT, INR in the last 72 hours.  Neurologically intact Neurovascular intact Sensation intact distally Intact pulses distally Dorsiflexion/Plantar flexion intact Incision: dressing C/D/I No cellulitis present Compartment soft Hemovac in place- approx 90 ml fluid.   Ivac in place with good seal.  No fluid in canister   Assessment/Plan: 1 Day Post-Op Procedure(s) (LRB): ARTHROPLASTY, KNEE, TOTAL (Right) Advance diet Up with therapy D/C IV fluids Discharge home with home health tomorrow WBAT RLE Continue hemovac- will pull tomorrow Continue with ivac- will need to swap out hospital unit for portable prevena tomorrow prior to discharge O2 sats low overnight- patient tells me she has sleep apnea and forgot cpap.  Will bring for tonight.  O2 sats back within normal limits.        Ronal LITTIE Grave 08/30/2023, 8:18 AM

## 2023-08-30 NOTE — Telephone Encounter (Signed)
 Called and [facility] for the pt to be transferred to inpatient

## 2023-08-30 NOTE — Evaluation (Signed)
 Occupational Therapy Evaluation Patient Details Name: [name] MRN: [id number] DOB: [birth year] Today's Date: 08/30/2023   History of Present Illness   Pt is 63 year old presented to [facility] on  08/29/23 for rt TKR. PMH - multiple back surgeries, rt rotator cuff repair, htn, sleep apnea, DM, arthritis     Clinical Impressions Pt presents with decline in function and safety with ADLs and ADL mobility with impaired balance and endurance. PTA pt was Ind with ADLs, IADLs, home mgt, cooking, drives and with mobility. Pt currently requires min A with LB ADLs, CGA with toileting tasks, Sup with mobility/transfers using RW. Pt would benefit from acute OT services to maximize level of function and safety     If plan is discharge home, recommend the following:   A little help with bathing/dressing/bathroom;A little help with walking and/or transfers;Assistance with cooking/housework;Assist for transportation;Help with stairs or ramp for entrance     Functional Status Assessment   Patient has had a recent decline in their functional status and demonstrates the ability to make significant improvements in function in a reasonable and predictable amount of time.     Equipment Recommendations   Tub/shower bench;Other (comment) (A/E kit)     Recommendations for Other Services         Precautions/Restrictions   Precautions Precautions: Fall;Knee;Other (comment) Precaution/Restrictions Comments: Wound VAC, hemovac Restrictions Weight Bearing Restrictions Per Provider Order: Yes RLE Weight Bearing Per Provider Order: Weight bearing as tolerated     Mobility Bed Mobility               General bed mobility comments: pt in recliner    Transfers Overall transfer level: Needs assistance Equipment used: Rolling walker (2 wheels) Transfers: Sit to/from Stand Sit to Stand: Supervision           General transfer comment: supervision for safety      Balance Overall  balance assessment: Needs assistance Sitting-balance support: No upper extremity supported, Feet supported Sitting balance-Leahy Scale: Good     Standing balance support: Bilateral upper extremity supported, Reliant on assistive device for balance Standing balance-Leahy Scale: Poor                             ADL either performed or assessed with clinical judgement   ADL Overall ADL's : Needs assistance/impaired Eating/Feeding: Independent   Grooming: Wash/dry hands;Wash/dry face;Standing;Supervision/safety   Upper Body Bathing: Set up   Lower Body Bathing: Minimal assistance   Upper Body Dressing : Set up   Lower Body Dressing: Minimal assistance   Toilet Transfer: Supervision/safety;Ambulation;Rolling walker (2 wheels);Regular Toilet;BSC/3in1;Grab bars;Cueing for safety   Toileting- Clothing Manipulation and Hygiene: Contact guard assist;Sit to/from stand       Functional mobility during ADLs: Supervision/safety;Rolling walker (2 wheels);Cueing for safety General ADL Comments: pt educated on LB ADL A/E and tub bench for home use with video demo also provided as well as handouts     Vision Baseline Vision/History: 1 Wears glasses Ability to See in Adequate Light: 0 Adequate Patient Visual Report: No change from baseline       Perception         Praxis         Pertinent Vitals/Pain Pain Assessment Pain Assessment: 0-10 Pain Score: 3  Pain Location: R knee Pain Descriptors / Indicators: Guarding, Grimacing, Aching Pain Intervention(s): Monitored during session, Premedicated before session, Repositioned     Extremity/Trunk Assessment Upper Extremity Assessment Upper Extremity  Assessment: Overall WFL for tasks assessed   Lower Extremity Assessment Lower Extremity Assessment: Defer to PT evaluation   Cervical / Trunk Assessment Cervical / Trunk Assessment: Back Surgery (hx of)   Communication Communication Communication: No apparent  difficulties   Cognition Arousal: Alert Behavior During Therapy: WFL for tasks assessed/performed Cognition: No apparent impairments                               Following commands: Intact Following commands impaired: Follows one step commands inconsistently, Follows one step commands with increased time     Cueing  General Comments   Cueing Techniques: Verbal cues;Tactile cues  VSS on RA   Exercises     Shoulder Instructions      Home Living Family/patient expects to be discharged to:: Private residence Living Arrangements: Children Available Help at Discharge: Family Type of Home: House Home Access: Stairs to enter Secretary/administrator of Steps: 2   Home Layout: One level     Bathroom Shower/Tub: Chief Strategy Officer: Standard     Home Equipment: Cane - single point          Prior Functioning/Environment Prior Level of Function : Independent/Modified Independent;Driving             Mobility Comments: has cane, used sometimes ADLs Comments: Ind with ADLs, IADLs, home mgt, cooking    OT Problem List: Impaired balance (sitting and/or standing);Pain;Decreased activity tolerance;Decreased knowledge of use of DME or AE   OT Treatment/Interventions: Self-care/ADL training;Patient/family education;Therapeutic exercise;Balance training;Therapeutic activities;DME and/or AE instruction      OT Goals(Current goals can be found in the care plan section)   Acute Rehab OT Goals Patient Stated Goal: go home OT Goal Formulation: With patient Time For Goal Achievement: 09/13/23 Potential to Achieve Goals: Good ADL Goals Pt Will Perform Grooming: with set-up;with modified independence;standing Pt Will Perform Lower Body Bathing: with contact guard assist;with supervision;sitting/lateral leans;sit to/from stand Pt Will Perform Lower Body Dressing: with contact guard assist;with supervision;sitting/lateral leans;sit to/from stand Pt  Will Transfer to Toilet: with modified independence;ambulating Pt Will Perform Toileting - Clothing Manipulation and hygiene: with supervision;with modified independence;sitting/lateral leans;sit to/from stand Pt Will Perform Tub/Shower Transfer: with contact guard assist;with supervision;ambulating;rolling walker;tub bench   OT Frequency:  Min 2X/week    Co-evaluation              AM-PAC OT 6 Clicks Daily Activity     Outcome Measure Help from another person eating meals?: None Help from another person taking care of personal grooming?: A Little Help from another person toileting, which includes using toliet, bedpan, or urinal?: A Little Help from another person bathing (including washing, rinsing, drying)?: A Little Help from another person to put on and taking off regular upper body clothing?: A Little Help from another person to put on and taking off regular lower body clothing?: A Little 6 Click Score: 19   End of Session Equipment Utilized During Treatment: Gait belt;Rolling walker (2 wheels);Oxygen (3 in 1)  Activity Tolerance: Patient tolerated treatment well Patient left: in chair;with call bell/phone within reach  OT Visit Diagnosis: Pain;Unsteadiness on feet (R26.81);Other abnormalities of gait and mobility (R26.89) Pain - Right/Left: Right Pain - part of body: Knee                Time: 8977-8950 OT Time Calculation (min): 27 min Charges:  OT General Charges $OT Visit: 1 Visit OT Evaluation $OT  Eval Low Complexity: 1 Low OT Treatments $Self Care/Home Management : 8-22 mins    Jacques Karna Loose 08/30/2023, 12:23 PM

## 2023-08-30 NOTE — Plan of Care (Signed)
 Pt resting comfortably, A & O. Pain control with PRN pain medication. Pt HR was low during the shift, provider notified. He did advice to continue monitoring the pt.  Problem: Education: Goal: Knowledge of General Education information will improve Description: Including pain rating scale, medication(s)/side effects and non-pharmacologic comfort measures Outcome: Progressing   Problem: Health Behavior/Discharge Planning: Goal: Ability to manage health-related needs will improve Outcome: Progressing   Problem: Clinical Measurements: Goal: Ability to maintain clinical measurements within normal limits will improve Outcome: Progressing Goal: Will remain free from infection Outcome: Progressing Goal: Diagnostic test results will improve Outcome: Progressing Goal: Respiratory complications will improve Outcome: Progressing Goal: Cardiovascular complication will be avoided Outcome: Progressing   Problem: Activity: Goal: Risk for activity intolerance will decrease Outcome: Progressing   Problem: Nutrition: Goal: Adequate nutrition will be maintained Outcome: Progressing   Problem: Coping: Goal: Level of anxiety will decrease Outcome: Progressing   Problem: Elimination: Goal: Will not experience complications related to bowel motility Outcome: Progressing Goal: Will not experience complications related to urinary retention Outcome: Progressing   Problem: Pain Managment: Goal: General experience of comfort will improve and/or be controlled Outcome: Progressing   Problem: Safety: Goal: Ability to remain free from injury will improve Outcome: Progressing   Problem: Skin Integrity: Goal: Risk for impaired skin integrity will decrease Outcome: Progressing   Problem: Education: Goal: Knowledge of the prescribed therapeutic regimen will improve Outcome: Progressing Goal: Individualized Educational Video(s) Outcome: Progressing   Problem: Activity: Goal: Ability to avoid  complications of mobility impairment will improve Outcome: Progressing Goal: Range of joint motion will improve Outcome: Progressing   Problem: Clinical Measurements: Goal: Postoperative complications will be avoided or minimized Outcome: Progressing   Problem: Pain Management: Goal: Pain level will decrease with appropriate interventions Outcome: Progressing   Problem: Skin Integrity: Goal: Will show signs of wound healing Outcome: Progressing

## 2023-08-30 NOTE — Anesthesia Procedure Notes (Addendum)
 Anesthesia Regional Block: Adductor canal block   Pre-Anesthetic Checklist: , timeout performed,  Correct Patient, Correct Site, Correct Laterality,  Correct Procedure, Correct Position, site marked,  Risks and benefits discussed,  Surgical consent,  Pre-op evaluation,  At surgeon's request and post-op pain management  Laterality: Right  Prep: Maximum Sterile Barrier Precautions used, chloraprep       Needles:  Injection technique: Single-shot  Needle Type: Echogenic Stimulator Needle     Needle Length: 9cm  Needle Gauge: 22     Additional Needles:   Procedures:,,,, ultrasound used (permanent image in chart),,    Narrative:  Start time: 08/29/2023 10:00 AM End time: 08/29/2023 10:05 AM Injection made incrementally with aspirations every 5 mL.  Performed by: Personally  Anesthesiologist: Merla Almarie HERO, DO  Additional Notes: Monitors applied. No increased pain on injection. No increased resistance to injection. Injection made in 5cc increments. Good needle visualization. Patient tolerated procedure well.

## 2023-08-30 NOTE — Telephone Encounter (Signed)
 Ok to give verbal order.

## 2023-08-30 NOTE — Addendum Note (Signed)
 Addendum  created 08/30/23 1434 by Merla Almarie HERO, DO   Child order released for a procedure order, Clinical Note Signed, Intraprocedure Blocks edited, SmartForm saved

## 2023-08-30 NOTE — Progress Notes (Signed)
 Physical Therapy Treatment Patient Details Name: [name] MRN: [id number] DOB: [birth year] Today's Date: 08/30/2023   History of Present Illness Pt is 63 year old presented to [facility] on  08/29/23 for rt TKR. PMH - multiple back surgeries, rt rotator cuff repair, htn, sleep apnea, DM, arthritis    PT Comments  Pt up in chair on arrival, agreeable to session and demonstrating continued good progress towards acute goals. Pt progressing activity tolerance this session with pt tolerating increased gait distance with RW for support and CGA for safety. Pt able to ascend/descend x2 steps in therapy gym with demonstration and education on sequencing initially provided with pt then demonstrating good recall. Continued education on  continued walker use at all times to maximize functional independence, safety, and decrease risk for falls as well as ice, HEP and compliance, safe car entry/exit, appropriate activity progression and importance of continued mobility. Pt continues to benefit from skilled PT services to progress toward functional mobility goals.       If plan is discharge home, recommend the following: A little help with walking and/or transfers;A little help with bathing/dressing/bathroom;Assist for transportation;Help with stairs or ramp for entrance   Can travel by private vehicle        Equipment Recommendations  Rolling walker (2 wheels)    Recommendations for Other Services       Precautions / Restrictions Precautions Precautions: Fall;Knee;Other (comment) Precaution/Restrictions Comments: Wound VAC, hemovac Restrictions Weight Bearing Restrictions Per Provider Order: Yes RLE Weight Bearing Per Provider Order: Weight bearing as tolerated     Mobility  Bed Mobility Overal bed mobility: Needs Assistance Bed Mobility: Supine to Sit     Supine to sit: HOB elevated, Supervision, Used rails     General bed mobility comments: pt in recliner    Transfers Overall transfer  level: Needs assistance Equipment used: Rolling walker (2 wheels), None Transfers: Sit to/from Stand Sit to Stand: Supervision           General transfer comment: supervision for safety, pt again standing without RW prior to this PTA being ready, reitterated importance of RW use with pt verbalizing understanding    Ambulation/Gait Ambulation/Gait assistance: Supervision Gait Distance (Feet): 225 Feet (x2) Assistive device: Rolling walker (2 wheels) Gait Pattern/deviations: Decreased step length - right, Decreased stance time - right, Antalgic, Step-through pattern Gait velocity: decr     General Gait Details: pt progresisng to a more fluid satep through pattern with increased distance, some hyperextension noted on R in stance   Stairs Stairs: Yes Stairs assistance: Contact guard assist Stair Management: Two rails, Step to pattern, Forwards Number of Stairs: 2 General stair comments: up/down steps in therapy gym with cues for sequencing, no LOB   Wheelchair Mobility     Tilt Bed    Modified Rankin (Stroke Patients Only)       Balance Overall balance assessment: Needs assistance Sitting-balance support: No upper extremity supported, Feet supported Sitting balance-Leahy Scale: Good     Standing balance support: Bilateral upper extremity supported, Reliant on assistive device for balance Standing balance-Leahy Scale: Poor Standing balance comment: reliant on RW support for dynamic tasks                            Communication Communication Communication: No apparent difficulties  Cognition Arousal: Alert Behavior During Therapy: WFL for tasks assessed/performed   PT - Cognitive impairments: No apparent impairments  Following commands: Intact      Cueing Cueing Techniques: Verbal cues, Tactile cues  Exercises Total Joint Exercises Ankle Circles/Pumps: AROM, Both, 10 reps Quad Sets: AROM, Right, 5 reps Heel  Slides: AROM, Right, 5 reps Hip ABduction/ADduction: AROM, Right, 5 reps Straight Leg Raises: AROM, Right, 5 reps    General Comments General comments (skin integrity, edema, etc.): VSS on RA, family present and supportive      Pertinent Vitals/Pain Pain Assessment Pain Assessment: Faces Faces Pain Scale: Hurts a little bit Pain Location: R knee Pain Descriptors / Indicators: Guarding, Grimacing, Aching Pain Intervention(s): Monitored during session, Limited activity within patient's tolerance    Home Living Family/patient expects to be discharged to:: Private residence Living Arrangements: Children Available Help at Discharge: Family Type of Home: House Home Access: Stairs to enter   Secretary/administrator of Steps: 2   Home Layout: One level Home Equipment: Cane - single point      Prior Function            PT Goals (current goals can now be found in the care plan section) Acute Rehab PT Goals Patient Stated Goal: not stated PT Goal Formulation: With patient Time For Goal Achievement: 09/05/23 Progress towards PT goals: Progressing toward goals    Frequency    7X/week      PT Plan      Co-evaluation              AM-PAC PT 6 Clicks Mobility   Outcome Measure  Help needed turning from your back to your side while in a flat bed without using bedrails?: A Little Help needed moving from lying on your back to sitting on the side of a flat bed without using bedrails?: A Little Help needed moving to and from a bed to a chair (including a wheelchair)?: A Little Help needed standing up from a chair using your arms (e.g., wheelchair or bedside chair)?: A Little Help needed to walk in hospital room?: A Little Help needed climbing 3-5 steps with a railing? : A Little 6 Click Score: 18    End of Session Equipment Utilized During Treatment: Gait belt Activity Tolerance: Patient tolerated treatment well Patient left: in chair;with call bell/phone within  reach;with family/visitor present Nurse Communication: Mobility status PT Visit Diagnosis: Other abnormalities of gait and mobility (R26.89);Pain;Difficulty in walking, not elsewhere classified (R26.2) Pain - Right/Left: Right Pain - part of body: Knee     Time: 8590-8568 PT Time Calculation (min) (ACUTE ONLY): 22 min  Charges:    $Gait Training: 8-22 mins PT General Charges $$ ACUTE PT VISIT: 1 Visit                     Kierrah Kilbride R. PTA Acute Rehabilitation Services Office: [phone]   Therisa CHRISTELLA Boor 08/30/2023, 2:37 PM

## 2023-08-31 ENCOUNTER — Other Ambulatory Visit (HOSPITAL_COMMUNITY): Payer: Self-pay

## 2023-08-31 ENCOUNTER — Other Ambulatory Visit: Payer: Self-pay | Admitting: Physician Assistant

## 2023-08-31 DIAGNOSIS — M1711 Unilateral primary osteoarthritis, right knee: Secondary | ICD-10-CM | POA: Diagnosis not present

## 2023-08-31 MED ORDER — OXYCODONE-ACETAMINOPHEN 5-325 MG PO TABS
1.0000 | ORAL_TABLET | Freq: Four times a day (QID) | ORAL | 0 refills | Status: DC | PRN
  Filled 2023-08-31: qty 40, 5d supply, fill #0

## 2023-08-31 MED ORDER — METHOCARBAMOL 500 MG PO TABS
500.0000 mg | ORAL_TABLET | Freq: Two times a day (BID) | ORAL | 2 refills | Status: AC | PRN
  Filled 2023-08-31 – 2023-10-08 (×3): qty 20, 10d supply, fill #0
  Filled 2023-11-04 (×2): qty 20, 10d supply, fill #1

## 2023-08-31 MED ORDER — ONDANSETRON HCL 4 MG PO TABS
4.0000 mg | ORAL_TABLET | Freq: Three times a day (TID) | ORAL | 0 refills | Status: DC | PRN
  Filled 2023-08-31 – 2023-10-08 (×2): qty 40, 14d supply, fill #0

## 2023-08-31 MED ORDER — DOCUSATE SODIUM 100 MG PO CAPS
100.0000 mg | ORAL_CAPSULE | Freq: Every day | ORAL | 2 refills | Status: AC | PRN
  Filled 2023-08-31 – 2023-10-08 (×2): qty 30, 30d supply, fill #0
  Filled 2023-11-04 (×2): qty 30, 30d supply, fill #1

## 2023-08-31 MED ORDER — APIXABAN 2.5 MG PO TABS
2.5000 mg | ORAL_TABLET | Freq: Two times a day (BID) | ORAL | 0 refills | Status: AC
  Filled 2023-08-31 – 2023-10-08 (×2): qty 60, 30d supply, fill #0

## 2023-08-31 NOTE — Progress Notes (Signed)
 Subjective: 2 Days Post-Op Procedure(s) (LRB): ARTHROPLASTY, KNEE, TOTAL (Right) Patient reports pain as moderate.    Objective: Vital signs in last 24 hours: Temp:  [97.5 F (36.4 C)-98 F (36.7 C)] 98 F (36.7 C) (08/06 0738) Pulse Rate:  [50-72] 72 (08/06 0738) Resp:  [16-18] 18 (08/06 0738) BP: (116-175)/(63-86) 137/85 (08/06 0738) SpO2:  [100 %] 100 % (08/06 0738) FiO2 (%):  [21 %] 21 % (08/05 2125)  Intake/Output from previous day: 08/05 0701 - 08/06 0700 In: 649.1 [I.V.:649.1] Out: 540 [Urine:300; Drains:240] Intake/Output this shift: Total I/O In: -  Out: 70 [Drains:70]  No results for input(s): HGB in the last 72 hours. No results for input(s): WBC, RBC, HCT, PLT in the last 72 hours. No results for input(s): NA, K, CL, CO2, BUN, CREATININE, GLUCOSE, CALCIUM  in the last 72 hours. No results for input(s): LABPT, INR in the last 72 hours.  Neurologically intact Neurovascular intact Sensation intact distally Intact pulses distally Dorsiflexion/Plantar flexion intact Incision: dressing C/D/I No cellulitis present Compartment soft Hemovac drain in place- approx 310 cc blood over past 24 hours Ivac in place with good seal.  No fluid in canister   Assessment/Plan: 2 Days Post-Op Procedure(s) (LRB): ARTHROPLASTY, KNEE, TOTAL (Right) Advance diet Up with therapy D/C IV fluids Discharge home with home health once cleared by PT WBAT RLE Hemovac drain pulled by me today Please swap hospital ivac unit with portable prevena prior to d/c      Ronal LITTIE Grave 08/31/2023, 8:07 AM

## 2023-08-31 NOTE — Plan of Care (Signed)
 Discharge instructions discussed with patient.  Patient instructed on home medications, restrictions, and follow up appointments. Belongings gathered and sent with patient.  Patients medications to be picked up in [facility] pharmacy

## 2023-08-31 NOTE — Progress Notes (Signed)
 Physical Therapy Treatment Patient Details Name: [name] MRN: [id number] DOB: 03-13-1960 Today's Date: 08/31/2023   History of Present Illness Pt is 63 year old presented to [facility] on  08/29/23 for rt TKR. PMH - multiple back surgeries, rt rotator cuff repair, htn, sleep apnea, DM, arthritis    PT Comments  Pt seated up on EOB on arrival, agreeable to session and demonstrating steady progress towards acute goals. Pt reporting increased pain this session vs previous date, however pt able to perform transfers and gait for hallway distance with CGA fading to supervision for safety with RW for support. Continued education on  continued walker use at all times to maximize functional independence, safety, and decrease risk for falls as well as ice, HEP and compliance, safe car entry/exit, stair ascent/descent sequencing, appropriate activity progression and importance of continued mobility with pt verbalizing understanding. Anticipate safe discharge, with assist level outlined below, once medically cleared, will continue to follow acutely.     If plan is discharge home, recommend the following: A little help with walking and/or transfers;A little help with bathing/dressing/bathroom;Assist for transportation;Help with stairs or ramp for entrance   Can travel by private vehicle        Equipment Recommendations  Rolling walker (2 wheels)    Recommendations for Other Services       Precautions / Restrictions Precautions Precautions: Fall;Knee;Other (comment) Precaution/Restrictions Comments: Wound VAC, hemovac Restrictions Weight Bearing Restrictions Per Provider Order: Yes RLE Weight Bearing Per Provider Order: Weight bearing as tolerated     Mobility  Bed Mobility Overal bed mobility: Needs Assistance             General bed mobility comments: pt seated up on EOB on arrival and in recliner at end of session    Transfers Overall transfer level: Needs assistance Equipment used:  Rolling walker (2 wheels), None Transfers: Sit to/from Stand Sit to Stand: Supervision           General transfer comment: supervision for safety, pt again standing without RW prior to this PTA being ready, reitterated importance of RW use with pt verbalizing understanding    Ambulation/Gait Ambulation/Gait assistance: Supervision Gait Distance (Feet): 125 Feet Assistive device: Rolling walker (2 wheels) Gait Pattern/deviations: Decreased step length - right, Decreased stance time - right, Antalgic, Step-through pattern Gait velocity: decr     General Gait Details: pt progresisng to a more fluid satep through pattern with increased distance, distance limited by increased pain this AM, no LOB   Stairs         General stair comments: verbally reviewed step sequencing with pt needing light cues to recall   Wheelchair Mobility     Tilt Bed    Modified Rankin (Stroke Patients Only)       Balance Overall balance assessment: Needs assistance Sitting-balance support: No upper extremity supported, Feet supported Sitting balance-Leahy Scale: Good     Standing balance support: Bilateral upper extremity supported, Reliant on assistive device for balance Standing balance-Leahy Scale: Poor Standing balance comment: reliant on RW support for dynamic tasks                            Communication Communication Communication: No apparent difficulties Factors Affecting Communication: Difficulty expressing self  Cognition Arousal: Alert Behavior During Therapy: WFL for tasks assessed/performed   PT - Cognitive impairments: No apparent impairments  Following commands: Intact      Cueing Cueing Techniques: Verbal cues, Tactile cues  Exercises      General Comments General comments (skin integrity, edema, etc.): VSS on RA      Pertinent Vitals/Pain Pain Assessment Pain Assessment: Faces Faces Pain Scale: Hurts little  more Pain Location: R knee Pain Descriptors / Indicators: Guarding, Grimacing, Aching Pain Intervention(s): Monitored during session, Limited activity within patient's tolerance, Ice applied    Home Living                          Prior Function            PT Goals (current goals can now be found in the care plan section) Acute Rehab PT Goals Patient Stated Goal: not stated PT Goal Formulation: With patient Time For Goal Achievement: 09/05/23 Progress towards PT goals: Progressing toward goals    Frequency    7X/week      PT Plan      Co-evaluation              AM-PAC PT 6 Clicks Mobility   Outcome Measure  Help needed turning from your back to your side while in a flat bed without using bedrails?: A Little Help needed moving from lying on your back to sitting on the side of a flat bed without using bedrails?: A Little Help needed moving to and from a bed to a chair (including a wheelchair)?: A Little Help needed standing up from a chair using your arms (e.g., wheelchair or bedside chair)?: A Little Help needed to walk in hospital room?: A Little Help needed climbing 3-5 steps with a railing? : A Little 6 Click Score: 18    End of Session Equipment Utilized During Treatment: Gait belt Activity Tolerance: Patient tolerated treatment well Patient left: in chair;with call bell/phone within reach (with iceman applied) Nurse Communication: Mobility status PT Visit Diagnosis: Other abnormalities of gait and mobility (R26.89);Pain;Difficulty in walking, not elsewhere classified (R26.2) Pain - Right/Left: Right Pain - part of body: Knee     Time: 0850-0907 PT Time Calculation (min) (ACUTE ONLY): 17 min  Charges:    $Gait Training: 8-22 mins PT General Charges $$ ACUTE PT VISIT: 1 Visit                     Stacey Mullen R. PTA Acute Rehabilitation Services Office: [phone]   [name] 08/31/2023, 9:11 AM

## 2023-08-31 NOTE — Discharge Summary (Signed)
 Patient ID: [name] MRN: [id number] DOB/AGE: 02-Feb-1960 63 y.o.  Admit date: 08/29/2023 Discharge date: 08/31/2023  Admission Diagnoses:  Principal Problem:   Status post total right knee replacement Active Problems:   S/P TKR (total knee replacement), right   Discharge Diagnoses:  Same  Past Medical History:  Diagnosis Date   Anemia when she was young   Arthritis    Congestion of nasal sinus    Constipation    takes Colace every other day   DDD (degenerative disc disease), lumbar    Dependent edema    Dry eyes    uses Systane Eye drops daily as needed   GERD (gastroesophageal reflux disease) 12/06/2011   takes Nexium  daily   Headache(784.0)    occasionally-d/t congestion   History of bronchitis 6-7 yrs ago   History of kidney stones    Hyperlipidemia    Hypertension    takes Metoprolol  and Diovan  daily   Hypothyroidism 12/06/2011   takes Synthroid  daily   Joint pain    Multiple allergies    takes Zyrtec daily;uses Nasonex daily as needed   Nocturia    Numbness    weakness-right hand   Onychomycosis    OSA on CPAP    Wears CPAP Nightly   Pancreatitis    Peripheral edema    takes Furosemide  daily   Pre-diabetes    S/P insertion of spinal cord stimulator    Shingles    Urinary frequency    Venous insufficiency of leg     Surgeries: Procedure(s): ARTHROPLASTY, KNEE, TOTAL on 08/29/2023   Consultants:   Discharged Condition: Improved  Hospital Course: [name] is an 63 y.o. female who was admitted 08/29/2023 for operative treatment ofStatus post total right knee replacement. Patient has severe unremitting pain that affects sleep, daily activities, and work/hobbies. After pre-op clearance the patient was taken to the operating room on 08/29/2023 and underwent  Procedure(s): ARTHROPLASTY, KNEE, TOTAL.    Patient was given perioperative antibiotics:  Anti-infectives (From admission, onward)    Start     Dose/Rate Route Frequency Ordered Stop    08/30/23 0000  doxycycline  (VIBRA -TABS) 100 MG tablet        100 mg Oral 2 times daily 08/30/23 0826     08/29/23 2200  doxycycline  (VIBRA -TABS) tablet 100 mg       Note to Pharmacy: To be taken after surgery     100 mg Oral 2 times daily 08/29/23 1532     08/29/23 1600  ceFAZolin  (ANCEF ) IVPB 2g/100 mL premix        2 g 200 mL/hr over 30 Minutes Intravenous Every 6 hours 08/29/23 1532 08/30/23 1516   08/29/23 1017  vancomycin  (VANCOCIN ) powder  Status:  Discontinued          As needed 08/29/23 1018 08/29/23 1248   08/29/23 0745  ceFAZolin  (ANCEF ) IVPB 2g/100 mL premix        2 g 200 mL/hr over 30 Minutes Intravenous On call to O.R. 08/29/23 0737 08/29/23 1107   08/29/23 0744  ceFAZolin  (ANCEF ) 2-4 GM/100ML-% IVPB       Note to Pharmacy: Barron Friday D: cabinet override      08/29/23 0744 08/29/23 1037        Patient was given sequential compression devices, early ambulation, and chemoprophylaxis to prevent DVT.  Inpatient Morphine  Milligram Equivalents Per Day 8/4 - 8/6   Values displayed are in units of MME/Day    Order Start / End Date 8/4  Yesterday Today    oxyCODONE  (Oxy IR/ROXICODONE ) immediate release tablet 5 mg 8/4 - 8/4 7.5 of Unknown -- --    oxyCODONE  (ROXICODONE ) 5 MG/5ML solution 5 mg 8/4 - 8/4 0 of Unknown -- --      Group total: 7.5 of Unknown      oxyCODONE  (Oxy IR/ROXICODONE ) immediate release tablet 5 mg 8/4 - No end date 0 of 15 0 of 30 0 of 30    oxyCODONE  (Oxy IR/ROXICODONE ) immediate release tablet 10 mg 8/4 - No end date 0 of 30 30 of 60 0 of 60    HYDROmorphone  (DILAUDID ) injection 1 mg 8/4 - No end date 0 of 40 20 of 60 0 of 60    fentaNYL  (SUBLIMAZE ) 100 MCG/2ML injection 8/4 - 8/4 0 of Unknown -- --    fentaNYL  (SUBLIMAZE ) injection 100 mcg 8/4 - 8/4 30 of 30 -- --    fentaNYL  citrate (PF) (SUBLIMAZE ) injection 8/4 - 8/4 *75 of 75 -- --    HYDROmorphone  (DILAUDID ) injection 0.25-0.5 mg 8/4 - 8/4 40 of 40-80 -- --    Daily Totals  * 152.5 of Unknown (at  least 230-270) 50 of 150 0 of 150  *One-Step medication  Calculation Errors     Order Type Date Details   fentaNYL  (SUBLIMAZE ) 100 MCG/2ML injection Ordered Dose -- Frequency type could not be determined   oxyCODONE  (Oxy IR/ROXICODONE ) immediate release tablet 5 mg Ordered Dose -- Insufficient frequency information   oxyCODONE  (ROXICODONE ) 5 MG/5ML solution 5 mg Ordered Dose -- Insufficient frequency information            Patient benefited maximally from hospital stay and there were no complications.    Recent vital signs: Patient Vitals for the past 24 hrs:  BP Temp Temp src Pulse Resp SpO2  08/31/23 0738 137/85 98 F (36.7 C) -- 72 18 100 %  08/31/23 0508 (!) 175/86 97.6 F (36.4 C) -- 60 16 100 %  08/30/23 2117 -- -- -- (!) 53 -- --  08/30/23 1958 116/63 -- -- (!) 50 -- 100 %  08/30/23 1426 132/75 (!) 97.5 F (36.4 C) Oral (!) 52 18 100 %     Recent laboratory studies: No results for input(s): WBC, HGB, HCT, PLT, NA, K, CL, CO2, BUN, CREATININE, GLUCOSE, INR, CALCIUM  in the last 72 hours.  Invalid input(s): PT, 2   Discharge Medications:   Allergies as of 08/31/2023       Reactions   Flagyl  [metronidazole ] Other (See Comments)   Developed pancreatitis   Adhesive [tape] Itching, Rash   Crestor [rosuvastatin] Rash   Latex Itching, Rash   Penicillins Rash   Ultram  [tramadol  Hcl] Swelling, Rash   eye edema   Vesicare [solifenacin] Rash        Medication List     STOP taking these medications    acetaminophen  500 MG tablet Commonly known as: TYLENOL    aspirin  EC 81 MG tablet   baclofen  10 MG tablet Commonly known as: LIORESAL    celecoxib  200 MG capsule Commonly known as: CeleBREX    meloxicam  15 MG tablet Commonly known as: MOBIC        TAKE these medications    albuterol  108 (90 Base) MCG/ACT inhaler Commonly known as: VENTOLIN  HFA Inhale 1-2 puffs into the lungs every 6 (six) hours as needed for wheezing or  shortness of breath.   Azelastine  HCl 137 MCG/SPRAY Soln PLACE 1 SPRAY PER NOSTRIL ONCE DAILY AT NIGHT What changed:  how much  to take when to take this reasons to take this   diazepam  5 MG tablet Commonly known as: VALIUM  Take 1 tablet by mouth once daily as needed for neck pain What changed:  how much to take how to take this when to take this reasons to take this   docusate sodium  100 MG capsule Commonly known as: Colace Take 1 capsule (100 mg total) by mouth daily as needed.   donepezil  5 MG tablet Commonly known as: ARICEPT  Take 1 tablet (5 mg total) by mouth at bedtime. What changed: when to take this   doxycycline  100 MG tablet Commonly known as: VIBRA -TABS Take 1 tablet (100 mg total) by mouth 2 (two) times daily. What changed: additional instructions   Eliquis  2.5 MG Tabs tablet Generic drug: apixaban  Take 1 tablet (2.5 mg total) by mouth 2 (two) times daily for 30 days after surgery to prevent blood clots   esomeprazole  40 MG capsule Commonly known as: NEXIUM  Take 1 capsule (40 mg total) by mouth daily.   fluticasone  50 MCG/ACT nasal spray Commonly known as: FLONASE  PLACE 2 SPRAYS IN EACH NOSTRIL DAILY AS DIRECTED What changed:  how much to take how to take this when to take this reasons to take this   gabapentin  300 MG capsule Commonly known as: NEURONTIN  Take 1 capsule (300 mg total) by mouth 3 (three) times daily. What changed:  when to take this additional instructions   levothyroxine  75 MCG tablet Commonly known as: SYNTHROID  TAKE 1 TABLET BY MOUTH EVERY MORNING ON AN EMPTY STOMACH   loratadine 10 MG tablet Commonly known as: CLARITIN Take 10 mg by mouth daily.   methocarbamol  500 MG tablet Commonly known as: ROBAXIN  Take 1 tablet (500 mg total) by mouth 2 (two) times daily as needed.   metoprolol  tartrate 25 MG tablet Commonly known as: LOPRESSOR  Take 1 tablet (25 mg total) by mouth 2 (two) times daily.   nystatin   powder Commonly known as: MYCOSTATIN /NYSTOP  Apply topically 2 (two) times daily as directed. What changed:  when to take this reasons to take this   olmesartan  20 MG tablet Commonly known as: BENICAR  Take 1 tablet (20 mg total) by mouth daily.   ondansetron  4 MG tablet Commonly known as: Zofran  Take 1 tablet (4 mg total) by mouth every 8 (eight) hours as needed for nausea or vomiting.   oxyCODONE -acetaminophen  5-325 MG tablet Commonly known as: Percocet Take 1-2 tablets by mouth every 6 (six) hours as needed. To be taken after surgery               Durable Medical Equipment  (From admission, onward)           Start     Ordered   08/29/23 1632  DME Bedside commode  Once       Comments: Confine to one room  Question:  Patient needs a bedside commode to treat with the following condition  Answer:  Status post left partial knee replacement   08/29/23 1632   08/29/23 1533  DME Walker rolling  Once       Question Answer Comment  Walker: With 5 Inch Wheels   Patient needs a walker to treat with the following condition Status post left partial knee replacement      08/29/23 1532   08/29/23 1533  DME 3 n 1  Once        08/29/23 1532            Diagnostic Studies: DG Knee  Right Port Result Date: 08/29/2023 CLINICAL DATA:  Status post right knee arthroplasty. EXAM: PORTABLE RIGHT KNEE - 1-2 VIEW COMPARISON:  None Available. FINDINGS: Right knee arthroplasty in expected alignment. No periprosthetic lucency or fracture. There has been patellar resurfacing. Recent postsurgical change includes air and edema in the soft tissues and joint space. Probable overlying drain and wound vacs in place. IMPRESSION: Right knee arthroplasty without immediate postoperative complication. Electronically Signed   By: Andrea Gasman M.D.   On: 08/29/2023 14:25    Disposition:      Follow-up Information     Jule Ronal CROME, PA-C Follow up in 1 week(s).   Specialty: Orthopedic  Surgery Contact information: [address] [place] [address] [phone]         [facility] Follow up.   Specialty: Home Health Services Why: home health services will be provided by [facility] Contact information: [address] [place] [address] [phone]                  Signed: Ronal CROME Jule 08/31/2023, 8:09 AM

## 2023-08-31 NOTE — TOC Transition Note (Incomplete)
 Transition of Care [facility]) - Discharge Note   Patient Details  Name: [name] MRN: [id number] Date of Birth: 10-13-1960  Transition of Care [facility]) CM/SW Contact:  Rosalva Jon Bloch, RN Phone Number: 08/31/2023, 8:47 AM   Clinical Narrative:    Patient will DC to: home Anticipated DC date:  08/31/2023 Family notified: yes Transport by: car    - s/p R TKA , 8/4  Per MD patient ready for DC to home. RN, patient, patient's family ( sister/son), and [facility] notified of DC. Family to assist with care once home. DME received from [employer], RW and [facility]. Pt without RX med concerns or transportation issues. Post hospital f/u noted on AVS.  RNCM will sign off for now as intervention is no longer needed. Please consult us  again if new needs arise.    Final next level of care: Home w Home Health Services Barriers to Discharge: No Barriers Identified   Patient Goals and CMS Choice     Choice offered to / list presented to : Patient      Discharge Placement                       Discharge Plan and Services Additional resources added to the After Visit Summary for     Discharge Planning Services: CM Consult            DME Arranged: Bedside commode, Walker rolling DME Agency: [employer] Date DME Agency Contacted: 08/29/23 Time DME Agency Contacted: 1635 Representative spoke with at DME Agency: [facility] Arranged: PT HH Agency: [facility]        Social Drivers of Health (SDOH) Interventions SDOH Screenings   Food Insecurity: No Food Insecurity (08/29/2023)  Housing: Low Risk  (08/29/2023)  Transportation Needs: No Transportation Needs (08/29/2023)  Utilities: Not At Risk (08/29/2023)  Social Connections: Moderately Integrated (08/29/2023)  Tobacco Use: Medium Risk (08/29/2023)     Readmission Risk Interventions     No data to display

## 2023-08-31 NOTE — Progress Notes (Signed)
 No pain meds or muscle relaxer ordered for d/c.  Attempted to contact providers and unable to reach them at this time.  Answering service called and unable to assist due to office is open however they are on lunch from 11:30-12:30, therefore instructed to call back.  Providers are not accessible on EPIC chat.

## 2023-09-02 ENCOUNTER — Other Ambulatory Visit (HOSPITAL_COMMUNITY): Payer: Self-pay

## 2023-09-05 ENCOUNTER — Telehealth: Payer: Self-pay | Admitting: Orthopaedic Surgery

## 2023-09-05 ENCOUNTER — Ambulatory Visit: Attending: Orthopaedic Surgery | Admitting: Physical Therapy

## 2023-09-05 DIAGNOSIS — M6281 Muscle weakness (generalized): Secondary | ICD-10-CM | POA: Insufficient documentation

## 2023-09-05 DIAGNOSIS — M25561 Pain in right knee: Secondary | ICD-10-CM | POA: Insufficient documentation

## 2023-09-05 DIAGNOSIS — R262 Difficulty in walking, not elsewhere classified: Secondary | ICD-10-CM | POA: Insufficient documentation

## 2023-09-05 DIAGNOSIS — M25661 Stiffness of right knee, not elsewhere classified: Secondary | ICD-10-CM | POA: Insufficient documentation

## 2023-09-05 NOTE — Telephone Encounter (Signed)
 Chyrl (PT) from [facility] called for vebral orders for 1wk1, 3wk 1, and 2wk 2. Please call Chyrl on secure line at [phone]

## 2023-09-05 NOTE — Telephone Encounter (Signed)
 Called and gave verbal on confidential voicemail.

## 2023-09-05 NOTE — Therapy (Deleted)
 OUTPATIENT PHYSICAL THERAPY LOWER EXTREMITY EVALUATION   Patient Name: [name] MRN: [id number] DOB:02-20-1960, 63 y.o., female Today's Date: 09/05/2023  END OF SESSION:   Past Medical History:  Diagnosis Date   Anemia when she was young   Arthritis    Congestion of nasal sinus    Constipation    takes Colace every other day   DDD (degenerative disc disease), lumbar    Dependent edema    Dry eyes    uses Systane Eye drops daily as needed   GERD (gastroesophageal reflux disease) 12/06/2011   takes Nexium  daily   Headache(784.0)    occasionally-d/t congestion   History of bronchitis 6-7 yrs ago   History of kidney stones    Hyperlipidemia    Hypertension    takes Metoprolol  and Diovan  daily   Hypothyroidism 12/06/2011   takes Synthroid  daily   Joint pain    Multiple allergies    takes Zyrtec daily;uses Nasonex daily as needed   Nocturia    Numbness    weakness-right hand   Onychomycosis    OSA on CPAP    Wears CPAP Nightly   Pancreatitis    Peripheral edema    takes Furosemide  daily   Pre-diabetes    S/P insertion of spinal cord stimulator    Shingles    Urinary frequency    Venous insufficiency of leg    Past Surgical History:  Procedure Laterality Date   ABDOMINAL HYSTERECTOMY     uterine prolapse   BACK SURGERY     L4-5   CARDIAC CATHETERIZATION  2007   CATARACT EXTRACTION, BILATERAL  2023   CHOLECYSTECTOMY     COLONOSCOPY     ESOPHAGOGASTRODUODENOSCOPY  5-8yrs ago   FOOT SURGERY Bilateral    KNEE ARTHROSCOPY Right    LAMINECTOMY THORACIC SPINE W/ PLACEMENT SPINAL CORD STIMULATOR     LAPAROSCOPIC GASTRIC SLEEVE RESECTION N/A 08/23/2016   Procedure: LAPAROSCOPIC GASTRIC SLEEVE RESECTION WITH UPPER ENDO;  Surgeon: Gladis Cough, MD;  Location: [facility];  Service: General;  Laterality: N/A;   POSTERIOR LUMBAR FUSION  03/10/2009   L3-4 and L3-4 fusion, Dr. Catalina Stains   SHOULDER ARTHROSCOPY WITH ROTATOR CUFF REPAIR AND SUBACROMIAL  DECOMPRESSION Right 08/23/2013   Procedure: RIGHT SHOULDER ARTHROSCOPY WITH  SUBACROMIAL DECOMPRESSION DISTAL CLAVICLE RESECTION AND  ROTATOR CUFF REPAIR ;  Surgeon: Franky CHRISTELLA Pointer, MD;  Location: [facility] OR;  Service: Orthopedics;  Laterality: Right;   TOTAL KNEE ARTHROPLASTY Right 08/29/2023   Procedure: ARTHROPLASTY, KNEE, TOTAL;  Surgeon: Jerri Kay CHRISTELLA, MD;  Location: [facility] OR;  Service: Orthopedics;  Laterality: Right;   TUBAL LIGATION     Patient Active Problem List   Diagnosis Date Noted   S/P TKR (total knee replacement), right 08/30/2023   Status post total right knee replacement 08/29/2023   Primary osteoarthritis of right knee 05/03/2023   Primary osteoarthritis of left knee 05/03/2023   Memory change 12/25/2020   Chronic pain syndrome 05/04/2019   Raynaud's disease 04/13/2019   Lumbar radiculopathy 12/18/2018   Spinal stenosis of lumbar region with neurogenic claudication 12/18/2018   Post laminectomy syndrome 12/18/2018   Bilateral primary osteoarthritis of knee 12/27/2016   History of excision of intestinal structure 09/07/2016   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 08/23/2016   Prediabetes 12/03/2015   OSA on CPAP 02/19/2015   Lung field abnormal 11/11/2014   Morbid obesity (HCC) 10/09/2014   Constipation 06/14/2014   Peripheral venous insufficiency 05/25/2013   Pulmonary HTN (HCC) 05/12/2013  Bilateral lower extremity edema 05/12/2013   Morbid obesity with BMI of 50.0-59.9, adult (HCC)    Shoulder joint pain 03/28/2013   Abnormal glucose level 02/20/2013   Alopecia 02/20/2013   Degeneration of lumbar intervertebral disc 02/20/2013   Hyperglycemia due to type 2 diabetes mellitus (HCC) 02/20/2013   Type 2 diabetes mellitus with other specified complication (HCC) 02/20/2013   Back pain, hx of prior back surgery 12/09/2011   Pancreatitis, mild, CT negative 12/08/2011   Fatty liver 12/08/2011   Substernal chest pain, suspected secondary to acute pancreatitis  12/06/2011   Hypertension 12/06/2011   Hypokalemia 12/06/2011   Hypothyroidism 12/06/2011   Anxiety 12/06/2011   BV (bacterial vaginosis)    Hyperlipidemia     PCP: Larnell Hamilton, MD  REFERRING PROVIDER: Jerri Kay HERO, MD  REFERRING DIAG: [phone] (ICD-10-CM) - S/P total knee arthroplasty, right  THERAPY DIAG:  No diagnosis found.  Rationale for Evaluation and Treatment: Rehabilitation  ONSET DATE: S/p right TKA 08/29/2023  SUBJECTIVE:   SUBJECTIVE STATEMENT: ***  PERTINENT HISTORY: *** PAIN:  Are you having pain? {OPRCPAIN:27236}  PRECAUTIONS: {Therapy precautions:24002}  RED FLAGS: {PT Red Flags:29287}   WEIGHT BEARING RESTRICTIONS: {Yes ***/No:24003}  FALLS:  Has patient fallen in last 6 months? {fallsyesno:27318}  LIVING ENVIRONMENT: Lives with: {OPRC lives with:25569::lives with their family} Lives in: {Lives in:25570} Stairs: {opstairs:27293} Has following equipment at home: {Assistive devices:23999}  OCCUPATION: ***  PLOF: {PLOF:24004}  PATIENT GOALS: ***  NEXT MD VISIT: ***  OBJECTIVE:  Note: Objective measures were completed at Evaluation unless otherwise noted.  DIAGNOSTIC FINDINGS: ***  PATIENT SURVEYS:  {rehab surveys:24030}  COGNITION: Overall cognitive status: {cognition:24006}     SENSATION: {sensation:27233}  EDEMA:  {edema:24020}  MUSCLE LENGTH: Hamstrings: Right *** deg; Left *** deg Debby test: Right *** deg; Left *** deg  POSTURE: {posture:25561}  PALPATION: ***  LOWER EXTREMITY ROM:  {AROM/PROM:27142} ROM Right eval Left eval  Hip flexion    Hip extension    Hip abduction    Hip adduction    Hip internal rotation    Hip external rotation    Knee flexion    Knee extension    Ankle dorsiflexion    Ankle plantarflexion    Ankle inversion    Ankle eversion     (Blank rows = not tested)  LOWER EXTREMITY MMT:  MMT Right eval Left eval  Hip flexion    Hip extension    Hip abduction    Hip  adduction    Hip internal rotation    Hip external rotation    Knee flexion    Knee extension    Ankle dorsiflexion    Ankle plantarflexion    Ankle inversion    Ankle eversion     (Blank rows = not tested)  LOWER EXTREMITY SPECIAL TESTS:  {LEspecialtests:26242}  FUNCTIONAL TESTS:  {Functional tests:24029}  GAIT: Distance walked: *** Assistive device utilized: {Assistive devices:23999} Level of assistance: {Levels of assistance:24026} Comments: ***  TREATMENT DATE: ***    PATIENT EDUCATION:  Education details: *** Person educated: {Person educated:25204} Education method: {Education Method:25205} Education comprehension: {Education Comprehension:25206}  HOME EXERCISE PROGRAM: ***  ASSESSMENT:  CLINICAL IMPRESSION: Patient is a *** y.o. *** who was seen today for physical therapy evaluation and treatment for ***.   OBJECTIVE IMPAIRMENTS: {opptimpairments:25111}.   ACTIVITY LIMITATIONS: {activitylimitations:27494}  PARTICIPATION LIMITATIONS: {participationrestrictions:25113}  PERSONAL FACTORS: {Personal factors:25162} are also affecting patient's functional outcome.   REHAB POTENTIAL: {rehabpotential:25112}  CLINICAL DECISION MAKING: {clinical decision making:25114}  EVALUATION COMPLEXITY: {Evaluation complexity:25115}   GOALS: Goals reviewed with patient? Yes  SHORT TERM GOALS: Target date: 10/03/2023   Patient will be independent with initial HEP. Baseline: Newly initiated Goal status: INITIAL  2.  Patient will *** Baseline: *** Goal status: {GOALSTATUS:25110}  3.  *** Baseline: *** Goal status: {GOALSTATUS:25110}   LONG TERM GOALS: Target date: 10/31/2023   Patient will be independent with advanced/ongoing HEP to improve outcomes and carryover.  Baseline: *** Goal status: {GOALSTATUS:25110}  2.  Patient will  report at least 75% improvement in R knee pain to improve QOL. Baseline: *** Goal status: {GOALSTATUS:25110}  3.  Patient will demonstrate improved R knee AROM to >/= ***-*** deg to allow for normal gait and stair mechanics. Baseline: *** Goal status: {GOALSTATUS:25110}  4.  Patient will demonstrate improved functional LE strength as demonstrated by ***. Baseline: *** Goal status: {GOALSTATUS:25110}  5.  Patient will be able to ambulate 600' with LRAD and normal gait pattern without increased pain to access community.  Baseline: *** Goal status: {GOALSTATUS:25110}  6. Patient will be able to ascend/descend stairs with 1 HR and reciprocal step pattern safely to access home and community.  Baseline: *** Goal status: {GOALSTATUS:25110}  7.  Patient will report >/=*** on LEFS (patient reported outcome measure) to demonstrate improved functional ability. Baseline: *** Goal status: {GOALSTATUS:25110}  8.  Patient will demonstrate TUG score of </=***. Baseline: *** Goal status: {GOALSTATUS:25110}     PLAN:  PT FREQUENCY: {rehab frequency:25116}  PT DURATION: {rehab duration:25117}  PLANNED INTERVENTIONS: {rehab planned interventions:25118::97110-Therapeutic exercises,97530- Therapeutic [phone]- Neuromuscular re-education,97535- Self Rjmz,02859- Manual therapy}  PLAN FOR NEXT SESSION: ***   Inette Doubrava April Ma L Aayra Hornbaker, PT 09/05/2023, 9:55 AM

## 2023-09-07 ENCOUNTER — Ambulatory Visit

## 2023-09-09 ENCOUNTER — Other Ambulatory Visit (HOSPITAL_COMMUNITY): Payer: Self-pay

## 2023-09-09 ENCOUNTER — Telehealth: Payer: Self-pay | Admitting: Orthopaedic Surgery

## 2023-09-09 NOTE — Telephone Encounter (Signed)
 Pt called requesting refill of pain medication. Please send to [facility] . Please call pt at [phone].

## 2023-09-09 NOTE — Telephone Encounter (Signed)
 Pt called asking if Dr Jerri can please send her oxycodone . Pt states she has none. Please send to [facility] ASAP and call pt when sent please. Pt phone number is [phone].

## 2023-09-10 ENCOUNTER — Other Ambulatory Visit (HOSPITAL_COMMUNITY): Payer: Self-pay

## 2023-09-10 ENCOUNTER — Telehealth: Payer: Self-pay | Admitting: Orthopedic Surgery

## 2023-09-10 MED ORDER — OXYCODONE-ACETAMINOPHEN 5-325 MG PO TABS
1.0000 | ORAL_TABLET | Freq: Four times a day (QID) | ORAL | 0 refills | Status: AC | PRN
  Filled 2023-09-10: qty 20, 3d supply, fill #0

## 2023-09-10 NOTE — Telephone Encounter (Signed)
 Orthopedic Telephone Note  Patient called this morning.  She is status post total knee replacement with Dr. Jerri.  She is still having a lot of pain and has used all of her narcotics.  She is asking for a refill.  A another prescription of 20 tablets of Percocet was sent in for her this morning.  Ozell DELENA Ada, MD Orthopedic Surgeon

## 2023-09-12 ENCOUNTER — Ambulatory Visit: Admitting: Physical Therapy

## 2023-09-12 NOTE — Telephone Encounter (Signed)
 Looks like Dr.Moore sent oxycodone  in on 09/10/2023

## 2023-09-14 ENCOUNTER — Ambulatory Visit: Admitting: Physical Therapy

## 2023-09-14 ENCOUNTER — Other Ambulatory Visit: Payer: Self-pay

## 2023-09-14 ENCOUNTER — Encounter: Payer: Self-pay | Admitting: Physical Therapy

## 2023-09-14 DIAGNOSIS — M25661 Stiffness of right knee, not elsewhere classified: Secondary | ICD-10-CM | POA: Diagnosis present

## 2023-09-14 DIAGNOSIS — M25561 Pain in right knee: Secondary | ICD-10-CM | POA: Diagnosis present

## 2023-09-14 DIAGNOSIS — R262 Difficulty in walking, not elsewhere classified: Secondary | ICD-10-CM

## 2023-09-14 DIAGNOSIS — M6281 Muscle weakness (generalized): Secondary | ICD-10-CM

## 2023-09-14 NOTE — Therapy (Signed)
 OUTPATIENT PHYSICAL THERAPY LOWER EXTREMITY EVALUATION   Patient Name: [name] MRN: [id number] DOB:08/19/1960, 63 y.o., female Today's Date: 09/14/2023  END OF SESSION:  PT End of Session - 09/14/23 1104     Visit Number 1    Number of Visits 16    Date for PT Re-Evaluation 11/09/23    Authorization Type [employer]    Authorization Time Period Auth request 09/14/23    PT Start Time 1105    PT Stop Time 1145    PT Time Calculation (min) 40 min    Activity Tolerance Patient tolerated treatment well          Past Medical History:  Diagnosis Date   Anemia when she was young   Arthritis    Congestion of nasal sinus    Constipation    takes Colace every other day   DDD (degenerative disc disease), lumbar    Dependent edema    Dry eyes    uses Systane Eye drops daily as needed   GERD (gastroesophageal reflux disease) 12/06/2011   takes Nexium  daily   Headache(784.0)    occasionally-d/t congestion   History of bronchitis 6-7 yrs ago   History of kidney stones    Hyperlipidemia    Hypertension    takes Metoprolol  and Diovan  daily   Hypothyroidism 12/06/2011   takes Synthroid  daily   Joint pain    Multiple allergies    takes Zyrtec daily;uses Nasonex daily as needed   Nocturia    Numbness    weakness-right hand   Onychomycosis    OSA on CPAP    Wears CPAP Nightly   Pancreatitis    Peripheral edema    takes Furosemide  daily   Pre-diabetes    S/P insertion of spinal cord stimulator    Shingles    Urinary frequency    Venous insufficiency of leg    Past Surgical History:  Procedure Laterality Date   ABDOMINAL HYSTERECTOMY     uterine prolapse   BACK SURGERY     L4-5   CARDIAC CATHETERIZATION  2007   CATARACT EXTRACTION, BILATERAL  2023   CHOLECYSTECTOMY     COLONOSCOPY     ESOPHAGOGASTRODUODENOSCOPY  5-69yrs ago   FOOT SURGERY Bilateral    KNEE ARTHROSCOPY Right    LAMINECTOMY THORACIC SPINE W/ PLACEMENT SPINAL CORD STIMULATOR      LAPAROSCOPIC GASTRIC SLEEVE RESECTION N/A 08/23/2016   Procedure: LAPAROSCOPIC GASTRIC SLEEVE RESECTION WITH UPPER ENDO;  Surgeon: Gladis Cough, MD;  Location: [facility];  Service: General;  Laterality: N/A;   POSTERIOR LUMBAR FUSION  03/10/2009   L3-4 and L3-4 fusion, Dr. Catalina Stains   SHOULDER ARTHROSCOPY WITH ROTATOR CUFF REPAIR AND SUBACROMIAL DECOMPRESSION Right 08/23/2013   Procedure: RIGHT SHOULDER ARTHROSCOPY WITH  SUBACROMIAL DECOMPRESSION DISTAL CLAVICLE RESECTION AND  ROTATOR CUFF REPAIR ;  Surgeon: Franky CHRISTELLA Pointer, MD;  Location: [facility];  Service: Orthopedics;  Laterality: Right;   TOTAL KNEE ARTHROPLASTY Right 08/29/2023   Procedure: ARTHROPLASTY, KNEE, TOTAL;  Surgeon: Jerri Kay CHRISTELLA, MD;  Location: [facility];  Service: Orthopedics;  Laterality: Right;   TUBAL LIGATION     Patient Active Problem List   Diagnosis Date Noted   S/P TKR (total knee replacement), right 08/30/2023   Status post total right knee replacement 08/29/2023   Primary osteoarthritis of right knee 05/03/2023   Primary osteoarthritis of left knee 05/03/2023   Memory change 12/25/2020   Chronic pain syndrome 05/04/2019   Raynaud's disease 04/13/2019  Lumbar radiculopathy 12/18/2018   Spinal stenosis of lumbar region with neurogenic claudication 12/18/2018   Post laminectomy syndrome 12/18/2018   Bilateral primary osteoarthritis of knee 12/27/2016   History of excision of intestinal structure 09/07/2016   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 08/23/2016   Prediabetes 12/03/2015   OSA on CPAP 02/19/2015   Lung field abnormal 11/11/2014   Morbid obesity (HCC) 10/09/2014   Constipation 06/14/2014   Peripheral venous insufficiency 05/25/2013   Pulmonary HTN (HCC) 05/12/2013   Bilateral lower extremity edema 05/12/2013   Morbid obesity with BMI of 50.0-59.9, adult (HCC)    Shoulder joint pain 03/28/2013   Abnormal glucose level 02/20/2013   Alopecia 02/20/2013   Degeneration of lumbar intervertebral  disc 02/20/2013   Hyperglycemia due to type 2 diabetes mellitus (HCC) 02/20/2013   Type 2 diabetes mellitus with other specified complication (HCC) 02/20/2013   Back pain, hx of prior back surgery 12/09/2011   Pancreatitis, mild, CT negative 12/08/2011   Fatty liver 12/08/2011   Substernal chest pain, suspected secondary to acute pancreatitis 12/06/2011   Hypertension 12/06/2011   Hypokalemia 12/06/2011   Hypothyroidism 12/06/2011   Anxiety 12/06/2011   BV (bacterial vaginosis)    Hyperlipidemia     PCP: Larnell Hamilton, MD  REFERRING PROVIDER: Jerri Kay HERO, MD  REFERRING DIAG: [phone] (ICD-10-CM) - S/P total knee arthroplasty, right  THERAPY DIAG:  Acute pain of right knee  Stiffness of right knee, not elsewhere classified  Difficulty in walking, not elsewhere classified  Muscle weakness (generalized)  Rationale for Evaluation and Treatment: Rehabilitation  ONSET DATE: 08/29/23  SUBJECTIVE:   SUBJECTIVE STATEMENT: Pt reports it's been hurting really bad. Pt's daughter and granddaughter are currently at home helping her. Pt reports she got 1 week of HHPT. Has been doing the exercises provided to her. Had been using the cane prior to surgery but left it in [place] . Has been using RW in the house currently.   PERTINENT HISTORY: Arthritis, DDD, HTN, spinal cord stimulator. Eval and treat right knee. S/p right TKA 08/29/2023  PAIN:  Are you having pain? Yes: NPRS scale: 8 or 9/10 currently, can get up to a 10/10 Pain location: Front of R knee Pain description: Aching and throbbing Aggravating factors: Standing/walking, sometimes sitting Relieving factors: Ice, pain medication  PRECAUTIONS: None  RED FLAGS: None   WEIGHT BEARING RESTRICTIONS: No  FALLS:  Has patient fallen in last 6 months? No  LIVING ENVIRONMENT: Lives with: granddaughter Lives in: House/apartment Stairs: No; 2 steps to porch Has following equipment at home: Vannie - 2 wheeled and bed side  commode  OCCUPATION: Retired - likes to watch TV, go out to eat  PLOF: Independent  PATIENT GOALS: Wants to get off using any a/d  NEXT MD VISIT: 09/15/23  OBJECTIVE:  Note: Objective measures were completed at Evaluation unless otherwise noted.  DIAGNOSTIC FINDINGS: n/a  PATIENT SURVEYS:  Lower Extremity Functional Score: 16 / 80 = 20.0 %  COGNITION: Overall cognitive status: Within functional limits for tasks assessed     SENSATION: WFL no overt N/T  EDEMA:  Unable to accurately measure due to prevena wound vac not properly sealing down  MUSCLE LENGTH: Hamstrings: see knee ROM Thomas test: did not assess  POSTURE: flexed trunk   PALPATION: TTP along R knee joint line  LOWER EXTREMITY ROM:  Active ROM Right eval Left eval  Hip flexion    Hip extension    Hip abduction    Hip adduction  Hip internal rotation    Hip external rotation    Knee flexion 73 sitting 100 sitting  Knee extension -15 sitting -10 sitting  Ankle dorsiflexion    Ankle plantarflexion    Ankle inversion    Ankle eversion     (Blank rows = not tested)  LOWER EXTREMITY MMT:  MMT Right eval Left eval  Hip flexion 4 4  Hip extension    Hip abduction supine 3+ 4  Hip adduction    Hip internal rotation    Hip external rotation    Knee flexion 4 5  Knee extension 4 5  Ankle dorsiflexion    Ankle plantarflexion    Ankle inversion    Ankle eversion     (Blank rows = not tested)  LOWER EXTREMITY SPECIAL TESTS:  Did not assess  FUNCTIONAL TESTS:  5x STS: 20.74 sec with UE  TUG: 39.38 sec with RW  GAIT: Distance walked: Into clinic Assistive device utilized: Walker - 2 wheeled Level of assistance: Modified independence Comments: Antalgic, trunk flexed, decreased R hip ext                                                                                                                                TREATMENT DATE: 09/14/23 See HEP below    PATIENT EDUCATION:  Education  details: Exam findings, POC, initial HEP Person educated: Patient Education method: Explanation, Demonstration, and Handouts Education comprehension: verbalized understanding, returned demonstration, and needs further education  HOME EXERCISE PROGRAM: Access Code: [email or web address] URL: [email or web address] Date: 09/14/2023 Prepared by: Raiden Haydu April Earnie Starring  Exercises - Supine Heel Slide with Strap  - 1 x daily - 7 x weekly - 2 sets - 10 reps - Hooklying Bilateral Isometric Clamshell  - 1 x daily - 7 x weekly - 2 sets - 10 reps - Supine Bridge  - 1 x daily - 7 x weekly - 2 sets - 10 reps - Supine Quad Set  - 1 x daily - 7 x weekly - 2 sets - 10 reps  ASSESSMENT:  CLINICAL IMPRESSION: Patient is a 63 y.o. F who was seen today for physical therapy evaluation and treatment s/p R TKA on 08/29/23. Pt has received 1 week of home health and states she has been compliant with her HEP. Assessment indicates limited R knee ROM, decreased strength (especially in quad), and decreased balance affecting pt's safe mobility for home and in the community. TUG and 5x STS scores both indicate a high risk of falls. Pt will benefit from PT to improve on these issues, decrease pain and maximize level of function.   OBJECTIVE IMPAIRMENTS: Abnormal gait, decreased activity tolerance, decreased balance, decreased endurance, decreased mobility, difficulty walking, decreased ROM, decreased strength, hypomobility, increased fascial restrictions, increased muscle spasms, impaired flexibility, improper body mechanics, postural dysfunction, and pain.   ACTIVITY LIMITATIONS: carrying, lifting, bending, sitting, standing, squatting, sleeping, stairs, transfers, bed mobility, bathing, toileting, dressing, hygiene/grooming, and locomotion  level  PARTICIPATION LIMITATIONS: meal prep, cleaning, laundry, driving, shopping, and community activity  PERSONAL FACTORS: Age, Fitness, Past/current experiences, and Time since  onset of injury/illness/exacerbation are also affecting patient's functional outcome.   REHAB POTENTIAL: Good  CLINICAL DECISION MAKING: Evolving/moderate complexity  EVALUATION COMPLEXITY: Moderate   GOALS: Goals reviewed with patient? Yes  SHORT TERM GOALS: Target date: 10/12/2023  Pt will be ind with initial HEP Baseline: Goal status: INITIAL  2.  Pt will have improved R knee ROM to >/=5-90 deg Baseline:  Goal status: INITIAL  3.  Pt will have improved 5x STS to </=15 sec to demo increasing functional LE strength Baseline:  Goal status: INITIAL    LONG TERM GOALS: Target date: 11/09/2023   Pt will be ind with management and progression of HEP Baseline:  Goal status: INITIAL  2.  Pt will have improved 5x STS to </=13 sec to be considered a low fall risk Baseline:  Goal status: INITIAL  3.  Pt will have improved TUG with LRAD to </=15 sec to demo decreased fall risk Baseline:  Goal status: INITIAL  4.  Pt will be able to amb limited community distances (at least 500') independently Baseline:  Goal status: INITIAL  5.  Pt will have improved LEFS to >/=25/80 to demo MCID for TKA Baseline: 16 Goal status: INITIAL  6.  Pt will have improved knee ROM to >/=5-110 deg to safely negotiate stairs within the community Baseline:  Goal status: INITIAL   PLAN:  PT FREQUENCY: 2x/week  PT DURATION: 8 weeks  PLANNED INTERVENTIONS: 97164- PT Re-evaluation, 97750- Physical Performance Testing, 97110-Therapeutic exercises, 97530- Therapeutic activity, 97112- Neuromuscular re-education, 97535- Self Care, [id number]- Manual therapy, [id number]- Gait training, [phone]- Aquatic Therapy, [phone]- Ultrasound, 79439 (1-2 muscles), 20561 (3+ muscles)- Dry Needling, Patient/Family education, Balance training, Stair training, Taping, Joint mobilization, Scar mobilization, Cryotherapy, and Moist heat   [employer] medicaid -- doesn't cover vaso, estim, ionto  PLAN FOR NEXT SESSION: Assess  response to HEP. Work on knee ROM, quad and hip strength.    Sedrick Tober April Ma L Channel Papandrea, PT, DPT 09/14/2023, 12:41 PM  For all possible CPT codes, reference the Planned Interventions line above.     Check all conditions that are expected to impact treatment: {Conditions expected to impact treatment:Musculoskeletal disorders   If treatment provided at initial evaluation, no treatment charged due to lack of authorization.

## 2023-09-15 ENCOUNTER — Other Ambulatory Visit (HOSPITAL_COMMUNITY): Payer: Self-pay

## 2023-09-15 ENCOUNTER — Ambulatory Visit (INDEPENDENT_AMBULATORY_CARE_PROVIDER_SITE_OTHER): Admitting: Orthopaedic Surgery

## 2023-09-15 DIAGNOSIS — Z96651 Presence of right artificial knee joint: Secondary | ICD-10-CM

## 2023-09-15 MED ORDER — HYDROCODONE-ACETAMINOPHEN 5-325 MG PO TABS
1.0000 | ORAL_TABLET | Freq: Every day | ORAL | 0 refills | Status: DC | PRN
  Filled 2023-09-15: qty 20, 10d supply, fill #0

## 2023-09-15 NOTE — Progress Notes (Signed)
 Post-Op Visit Note   Patient: [name]           Date of Birth: 10-09-1960           MRN: [id number] Visit Date: 09/15/2023 PCP: Larnell Hamilton, MD   Assessment & Plan:  Chief Complaint:  Chief Complaint  Patient presents with   Right Knee - Pain, Routine Post Op   Visit Diagnoses:  1. Status post total right knee replacement     Plan: History of Present Illness [name] is a 63 year old female who presents for 1st postop follow-up after right knee replacement surgery.  She has completed home health physical therapy and attended one outpatient therapy session, which she found challenging. She uses oxycodone  for pain management, with a recent refill of 20 pills on September 10, 2023, and has about three or four pills left. She also uses Advil  or Aleve for inflammation, along with an ice machine and compression. Her knee bends to about 90 degrees, as confirmed by her physical therapist, and she is walking better. She experiences achiness inside the knee and has difficulty getting up from a sitting position, although she can get down without issue.  Physical Exam GENERAL: Incision site healed.  No drainage.  Range of motion is progressing nicely.  Assessment and Plan Status post right total knee arthroplasty Two weeks post-surgery with well-healing incision, no infection, and good progress in physical therapy achieving 90 degrees knee flexion. - Remove sutures and apply steri-strips. - Instruct her to remove steri-strips in one week. - Advise showering but avoid soaking incision. - Reviewed x-rays of knee replacement with her.  Postoperative pain following right total knee arthroplasty Pain due to surgical inflammation, transitioning from oxycodone  to hydrocodone  to reduce opioid use. - Prescribe hydrocodone  for pain management. - Advise use of Advil  or Aleve for inflammation. - Encourage use of ice machine and compression for inflammation control.  Follow-Up  Instructions: Return in about 4 weeks (around 10/13/2023) for with lindsey.   Orders:  No orders of the defined types were placed in this encounter.  Meds ordered this encounter  Medications   HYDROcodone -acetaminophen  (NORCO/VICODIN) 5-325 MG tablet    Sig: Take 1-2 tablets by mouth daily as needed for moderate pain (pain score 4-6).    Dispense:  20 tablet    Refill:  0    Imaging: No results found.  PMFS History: Patient Active Problem List   Diagnosis Date Noted   S/P TKR (total knee replacement), right 08/30/2023   Status post total right knee replacement 08/29/2023   Primary osteoarthritis of right knee 05/03/2023   Primary osteoarthritis of left knee 05/03/2023   Memory change 12/25/2020   Chronic pain syndrome 05/04/2019   Raynaud's disease 04/13/2019   Lumbar radiculopathy 12/18/2018   Spinal stenosis of lumbar region with neurogenic claudication 12/18/2018   Post laminectomy syndrome 12/18/2018   Bilateral primary osteoarthritis of knee 12/27/2016   History of excision of intestinal structure 09/07/2016   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 08/23/2016   Prediabetes 12/03/2015   OSA on CPAP 02/19/2015   Lung field abnormal 11/11/2014   Morbid obesity (HCC) 10/09/2014   Constipation 06/14/2014   Peripheral venous insufficiency 05/25/2013   Pulmonary HTN (HCC) 05/12/2013   Bilateral lower extremity edema 05/12/2013   Morbid obesity with BMI of 50.0-59.9, adult (HCC)    Shoulder joint pain 03/28/2013   Abnormal glucose level 02/20/2013   Alopecia 02/20/2013   Degeneration of lumbar intervertebral disc  02/20/2013   Hyperglycemia due to type 2 diabetes mellitus (HCC) 02/20/2013   Type 2 diabetes mellitus with other specified complication (HCC) 02/20/2013   Back pain, hx of prior back surgery 12/09/2011   Pancreatitis, mild, CT negative 12/08/2011   Fatty liver 12/08/2011   Substernal chest pain, suspected secondary to acute pancreatitis 12/06/2011    Hypertension 12/06/2011   Hypokalemia 12/06/2011   Hypothyroidism 12/06/2011   Anxiety 12/06/2011   BV (bacterial vaginosis)    Hyperlipidemia    Past Medical History:  Diagnosis Date   Anemia when she was young   Arthritis    Congestion of nasal sinus    Constipation    takes Colace every other day   DDD (degenerative disc disease), lumbar    Dependent edema    Dry eyes    uses Systane Eye drops daily as needed   GERD (gastroesophageal reflux disease) 12/06/2011   takes Nexium  daily   Headache(784.0)    occasionally-d/t congestion   History of bronchitis 6-7 yrs ago   History of kidney stones    Hyperlipidemia    Hypertension    takes Metoprolol  and Diovan  daily   Hypothyroidism 12/06/2011   takes Synthroid  daily   Joint pain    Multiple allergies    takes Zyrtec daily;uses Nasonex daily as needed   Nocturia    Numbness    weakness-right hand   Onychomycosis    OSA on CPAP    Wears CPAP Nightly   Pancreatitis    Peripheral edema    takes Furosemide  daily   Pre-diabetes    S/P insertion of spinal cord stimulator    Shingles    Urinary frequency    Venous insufficiency of leg     Family History  Problem Relation Age of Onset   Hypertension Mother    Dementia Mother    Alzheimer's disease Mother    Hypertension Father    Hypertension Sister    Kidney disease Sister        KIDNEL TRANSPLANT   Thyroid  disease Sister    Thyroid  disease Sister    Hypertension Sister    Breast cancer Maternal Aunt        >50; passed away from it   Breast cancer Maternal Aunt        >50    Past Surgical History:  Procedure Laterality Date   ABDOMINAL HYSTERECTOMY     uterine prolapse   BACK SURGERY     L4-5   CARDIAC CATHETERIZATION  2007   CATARACT EXTRACTION, BILATERAL  2023   CHOLECYSTECTOMY     COLONOSCOPY     ESOPHAGOGASTRODUODENOSCOPY  5-71yrs ago   FOOT SURGERY Bilateral    KNEE ARTHROSCOPY Right    LAMINECTOMY THORACIC SPINE W/ PLACEMENT SPINAL CORD  STIMULATOR     LAPAROSCOPIC GASTRIC SLEEVE RESECTION N/A 08/23/2016   Procedure: LAPAROSCOPIC GASTRIC SLEEVE RESECTION WITH UPPER ENDO;  Surgeon: Gladis Cough, MD;  Location: [facility];  Service: General;  Laterality: N/A;   POSTERIOR LUMBAR FUSION  03/10/2009   L3-4 and L3-4 fusion, Dr. Catalina Stains   SHOULDER ARTHROSCOPY WITH ROTATOR CUFF REPAIR AND SUBACROMIAL DECOMPRESSION Right 08/23/2013   Procedure: RIGHT SHOULDER ARTHROSCOPY WITH  SUBACROMIAL DECOMPRESSION DISTAL CLAVICLE RESECTION AND  ROTATOR CUFF REPAIR ;  Surgeon: Franky CHRISTELLA Pointer, MD;  Location: [facility];  Service: Orthopedics;  Laterality: Right;   TOTAL KNEE ARTHROPLASTY Right 08/29/2023   Procedure: ARTHROPLASTY, KNEE, TOTAL;  Surgeon: Jerri Kay CHRISTELLA, MD;  Location: [facility];  Service: Orthopedics;  Laterality: Right;   TUBAL LIGATION     Social History   Occupational History    Employer: [employer]    Comment: [employer]  Tobacco Use   Smoking status: Former    Current packs/day: 0.00    Types: Cigarettes    Quit date: 1993    Years since quitting: 32.6   Smokeless tobacco: Never   Tobacco comments:    quit smoking in 1993  Vaping Use   Vaping status: Never Used  Substance and Sexual Activity   Alcohol  use: Yes    Comment: rarely; mixed drink   Drug use: No   Sexual activity: Not Currently    Partners: Male    Birth control/protection: Surgical    Comment: Hysterectomy

## 2023-09-16 ENCOUNTER — Other Ambulatory Visit (HOSPITAL_COMMUNITY): Payer: Self-pay

## 2023-09-16 ENCOUNTER — Other Ambulatory Visit: Payer: Self-pay | Admitting: Orthopedic Surgery

## 2023-09-16 ENCOUNTER — Telehealth: Payer: Self-pay | Admitting: Orthopaedic Surgery

## 2023-09-16 MED ORDER — HYDROCODONE-ACETAMINOPHEN 5-325 MG PO TABS
1.0000 | ORAL_TABLET | Freq: Every day | ORAL | 0 refills | Status: DC | PRN
  Filled 2023-09-16: qty 10, 5d supply, fill #0

## 2023-09-16 NOTE — Telephone Encounter (Signed)
 done

## 2023-09-16 NOTE — Telephone Encounter (Signed)
 Called and notified patient that it has been resent to pharmacy.

## 2023-09-16 NOTE — Telephone Encounter (Signed)
 Pt called and requesting lower qtlay due to medicaid wont pay for 10 day supply. Need 5 day supply. Please change qty and call pt at [phone].

## 2023-09-19 ENCOUNTER — Other Ambulatory Visit (HOSPITAL_COMMUNITY): Payer: Self-pay

## 2023-09-19 ENCOUNTER — Encounter (HOSPITAL_COMMUNITY): Payer: Self-pay | Admitting: Pharmacist

## 2023-09-20 ENCOUNTER — Ambulatory Visit: Admitting: Physical Therapy

## 2023-09-20 ENCOUNTER — Other Ambulatory Visit: Payer: Self-pay | Admitting: Physician Assistant

## 2023-09-20 ENCOUNTER — Other Ambulatory Visit (HOSPITAL_COMMUNITY): Payer: Self-pay

## 2023-09-20 ENCOUNTER — Other Ambulatory Visit: Payer: Self-pay | Admitting: Orthopaedic Surgery

## 2023-09-20 ENCOUNTER — Encounter: Payer: Self-pay | Admitting: Physical Therapy

## 2023-09-20 DIAGNOSIS — R262 Difficulty in walking, not elsewhere classified: Secondary | ICD-10-CM

## 2023-09-20 DIAGNOSIS — M25561 Pain in right knee: Secondary | ICD-10-CM

## 2023-09-20 DIAGNOSIS — M6281 Muscle weakness (generalized): Secondary | ICD-10-CM

## 2023-09-20 DIAGNOSIS — M25661 Stiffness of right knee, not elsewhere classified: Secondary | ICD-10-CM

## 2023-09-20 MED ORDER — HYDROCODONE-ACETAMINOPHEN 5-325 MG PO TABS
1.0000 | ORAL_TABLET | Freq: Every day | ORAL | 0 refills | Status: AC | PRN
  Filled 2023-09-20: qty 20, 10d supply, fill #0

## 2023-09-20 NOTE — Telephone Encounter (Signed)
 sent

## 2023-09-20 NOTE — Therapy (Signed)
 OUTPATIENT PHYSICAL THERAPY LOWER EXTREMITY TREATMENT   Patient Name: [name] MRN: [id number] DOB:1960-07-30, 63 y.o., female Today's Date: 09/20/2023  END OF SESSION:  PT End of Session - 09/20/23 1059     Visit Number 2    Number of Visits 16    Date for PT Re-Evaluation 11/09/23    Authorization Type [employer]    Authorization Time Period Approved 13 visits 09/14/23-12/13/23    Authorization - Visit Number 1    Authorization - Number of Visits 13    PT Start Time 1100    PT Stop Time 1140    PT Time Calculation (min) 40 min    Activity Tolerance Patient tolerated treatment well           Past Medical History:  Diagnosis Date   Anemia when she was young   Arthritis    Congestion of nasal sinus    Constipation    takes Colace every other day   DDD (degenerative disc disease), lumbar    Dependent edema    Dry eyes    uses Systane Eye drops daily as needed   GERD (gastroesophageal reflux disease) 12/06/2011   takes Nexium  daily   Headache(784.0)    occasionally-d/t congestion   History of bronchitis 6-7 yrs ago   History of kidney stones    Hyperlipidemia    Hypertension    takes Metoprolol  and Diovan  daily   Hypothyroidism 12/06/2011   takes Synthroid  daily   Joint pain    Multiple allergies    takes Zyrtec daily;uses Nasonex daily as needed   Nocturia    Numbness    weakness-right hand   Onychomycosis    OSA on CPAP    Wears CPAP Nightly   Pancreatitis    Peripheral edema    takes Furosemide  daily   Pre-diabetes    S/P insertion of spinal cord stimulator    Shingles    Urinary frequency    Venous insufficiency of leg    Past Surgical History:  Procedure Laterality Date   ABDOMINAL HYSTERECTOMY     uterine prolapse   BACK SURGERY     L4-5   CARDIAC CATHETERIZATION  2007   CATARACT EXTRACTION, BILATERAL  2023   CHOLECYSTECTOMY     COLONOSCOPY     ESOPHAGOGASTRODUODENOSCOPY  5-53yrs ago   FOOT SURGERY Bilateral    KNEE  ARTHROSCOPY Right    LAMINECTOMY THORACIC SPINE W/ PLACEMENT SPINAL CORD STIMULATOR     LAPAROSCOPIC GASTRIC SLEEVE RESECTION N/A 08/23/2016   Procedure: LAPAROSCOPIC GASTRIC SLEEVE RESECTION WITH UPPER ENDO;  Surgeon: Gladis Cough, MD;  Location: [facility];  Service: General;  Laterality: N/A;   POSTERIOR LUMBAR FUSION  03/10/2009   L3-4 and L3-4 fusion, Dr. Catalina Stains   SHOULDER ARTHROSCOPY WITH ROTATOR CUFF REPAIR AND SUBACROMIAL DECOMPRESSION Right 08/23/2013   Procedure: RIGHT SHOULDER ARTHROSCOPY WITH  SUBACROMIAL DECOMPRESSION DISTAL CLAVICLE RESECTION AND  ROTATOR CUFF REPAIR ;  Surgeon: Franky CHRISTELLA Pointer, MD;  Location: [facility];  Service: Orthopedics;  Laterality: Right;   TOTAL KNEE ARTHROPLASTY Right 08/29/2023   Procedure: ARTHROPLASTY, KNEE, TOTAL;  Surgeon: Jerri Kay CHRISTELLA, MD;  Location: [facility];  Service: Orthopedics;  Laterality: Right;   TUBAL LIGATION     Patient Active Problem List   Diagnosis Date Noted   S/P TKR (total knee replacement), right 08/30/2023   Status post total right knee replacement 08/29/2023   Primary osteoarthritis of right knee 05/03/2023   Primary osteoarthritis of left  knee 05/03/2023   Memory change 12/25/2020   Chronic pain syndrome 05/04/2019   Raynaud's disease 04/13/2019   Lumbar radiculopathy 12/18/2018   Spinal stenosis of lumbar region with neurogenic claudication 12/18/2018   Post laminectomy syndrome 12/18/2018   Bilateral primary osteoarthritis of knee 12/27/2016   History of excision of intestinal structure 09/07/2016   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 08/23/2016   Prediabetes 12/03/2015   OSA on CPAP 02/19/2015   Lung field abnormal 11/11/2014   Morbid obesity (HCC) 10/09/2014   Constipation 06/14/2014   Peripheral venous insufficiency 05/25/2013   Pulmonary HTN (HCC) 05/12/2013   Bilateral lower extremity edema 05/12/2013   Morbid obesity with BMI of 50.0-59.9, adult (HCC)    Shoulder joint pain 03/28/2013   Abnormal  glucose level 02/20/2013   Alopecia 02/20/2013   Degeneration of lumbar intervertebral disc 02/20/2013   Hyperglycemia due to type 2 diabetes mellitus (HCC) 02/20/2013   Type 2 diabetes mellitus with other specified complication (HCC) 02/20/2013   Back pain, hx of prior back surgery 12/09/2011   Pancreatitis, mild, CT negative 12/08/2011   Fatty liver 12/08/2011   Substernal chest pain, suspected secondary to acute pancreatitis 12/06/2011   Hypertension 12/06/2011   Hypokalemia 12/06/2011   Hypothyroidism 12/06/2011   Anxiety 12/06/2011   BV (bacterial vaginosis)    Hyperlipidemia     PCP: Larnell Hamilton, MD  REFERRING PROVIDER: Jerri Kay HERO, MD  REFERRING DIAG: [phone] (ICD-10-CM) - S/P total knee arthroplasty, right  THERAPY DIAG:  Acute pain of right knee  Stiffness of right knee, not elsewhere classified  Difficulty in walking, not elsewhere classified  Muscle weakness (generalized)  Rationale for Evaluation and Treatment: Rehabilitation  ONSET DATE: 08/29/23  SUBJECTIVE:   SUBJECTIVE STATEMENT: Pt reports a knot on the top of her thigh. States her incision is looking pretty good  From eval: Pt reports it's been hurting really bad. Pt's daughter and granddaughter are currently at home helping her. Pt reports she got 1 week of HHPT. Has been doing the exercises provided to her. Had been using the cane prior to surgery but left it in [place] . Has been using RW in the house currently.   PERTINENT HISTORY: Arthritis, DDD, HTN, spinal cord stimulator. Eval and treat right knee. S/p right TKA 08/29/2023  PAIN:  Are you having pain? Yes: NPRS scale: 7/10 currently, can get up to a 10/10 Pain location: Front of R knee Pain description: Aching and throbbing Aggravating factors: Standing/walking, sometimes sitting Relieving factors: Ice, pain medication  PRECAUTIONS: None  RED FLAGS: None   WEIGHT BEARING RESTRICTIONS: No  FALLS:  Has patient fallen in last 6  months? No  LIVING ENVIRONMENT: Lives with: granddaughter Lives in: House/apartment Stairs: No; 2 steps to porch Has following equipment at home: Vannie - 2 wheeled and bed side commode  OCCUPATION: Retired - likes to watch TV, go out to eat  PLOF: Independent  PATIENT GOALS: Wants to get off using any a/d  NEXT MD VISIT: 09/15/23  OBJECTIVE:  Note: Objective measures were completed at Evaluation unless otherwise noted.  DIAGNOSTIC FINDINGS: n/a  PATIENT SURVEYS:  Lower Extremity Functional Score: 16 / 80 = 20.0 %  COGNITION: Overall cognitive status: Within functional limits for tasks assessed     SENSATION: WFL no overt N/T  EDEMA:  Unable to accurately measure due to prevena wound vac not properly sealing down  MUSCLE LENGTH: Hamstrings: see knee ROM Thomas test: did not assess  POSTURE: flexed trunk   PALPATION:  TTP along R knee joint line  LOWER EXTREMITY ROM:  Active ROM Right eval Left eval   Hip flexion     Hip extension     Hip abduction     Hip adduction     Hip internal rotation     Hip external rotation     Knee flexion 73 sitting 100 sitting   Knee extension -15 sitting -10 sitting   Ankle dorsiflexion     Ankle plantarflexion     Ankle inversion     Ankle eversion      (Blank rows = not tested)  LOWER EXTREMITY MMT:  MMT Right eval Left eval  Hip flexion 4 4  Hip extension    Hip abduction supine 3+ 4  Hip adduction    Hip internal rotation    Hip external rotation    Knee flexion 4 5  Knee extension 4 5  Ankle dorsiflexion    Ankle plantarflexion    Ankle inversion    Ankle eversion     (Blank rows = not tested)  LOWER EXTREMITY SPECIAL TESTS:  Did not assess  FUNCTIONAL TESTS:  5x STS: 20.74 sec with UE  TUG: 39.38 sec with RW  GAIT: Distance walked: Into clinic Assistive device utilized: Environmental consultant - 2 wheeled Level of assistance: Modified independence Comments: Antalgic, trunk flexed, decreased R hip ext                                                                                                                                 [facility] Adult PT Treatment:                                                DATE: 09/20/23 Therapeutic Exercise: Seated heel slides x20 Seated knee flexion self stretch 3x10 Supine hip flexor stretch x 30 Supine hip flexor/quad stretch with strap x 30 Supine hamstring stretch x 30 Supine quad set 3x10 Supine clamshell red TB 2x10 Supine beginner bridge x15 (pt found this too painful) Standing hip ext 2x10 Manual Therapy: Scar massage TPR proximal quad Gait: Amb x 100' RW SBA. Cues for heel/toe pattern, symmetrical steps  Modalities: Vaso R knee medium compression for edema, 40 deg temp, bilat LEs elevated x10 min at end of session (did not bill) Self Care: Self massage/MFR/TPR quad muscle Scar massage   09/14/23 See HEP below    PATIENT EDUCATION:  Education details: Exam findings, POC, initial HEP Person educated: Patient Education method: Explanation, Demonstration, and Handouts Education comprehension: verbalized understanding, returned demonstration, and needs further education  HOME EXERCISE PROGRAM: Access Code: [email or web address] URL: [email or web address] Date: 09/14/2023 Prepared by: Prestina Raigoza April Marie Zoie Sarin  Exercises - Supine Heel Slide with Strap  - 1 x daily - 7 x weekly - 2 sets - 10 reps - Hooklying Bilateral  Isometric Clamshell  - 1 x daily - 7 x weekly - 2 sets - 10 reps - Supine Bridge  - 1 x daily - 7 x weekly - 2 sets - 10 reps - Supine Quad Set  - 1 x daily - 7 x weekly - 2 sets - 10 reps  ASSESSMENT:  CLINICAL IMPRESSION: Treatment focused on reviewing HEP -- pt had lost her paper. Trigger point notable along proximal quad. Educated pt on how to perform self massage at home to address this as well as a quad stretch. Continued to focus on gross strengthening. ROM is progressing well. Worked on improving gait symmetry.   From  eval: Patient is a 63 y.o. F who was seen today for physical therapy evaluation and treatment s/p R TKA on 08/29/23. Pt has received 1 week of home health and states she has been compliant with her HEP. Assessment indicates limited R knee ROM, decreased strength (especially in quad), and decreased balance affecting pt's safe mobility for home and in the community. TUG and 5x STS scores both indicate a high risk of falls. Pt will benefit from PT to improve on these issues, decrease pain and maximize level of function.   OBJECTIVE IMPAIRMENTS: Abnormal gait, decreased activity tolerance, decreased balance, decreased endurance, decreased mobility, difficulty walking, decreased ROM, decreased strength, hypomobility, increased fascial restrictions, increased muscle spasms, impaired flexibility, improper body mechanics, postural dysfunction, and pain.   ACTIVITY LIMITATIONS: carrying, lifting, bending, sitting, standing, squatting, sleeping, stairs, transfers, bed mobility, bathing, toileting, dressing, hygiene/grooming, and locomotion level  PARTICIPATION LIMITATIONS: meal prep, cleaning, laundry, driving, shopping, and community activity  PERSONAL FACTORS: Age, Fitness, Past/current experiences, and Time since onset of injury/illness/exacerbation are also affecting patient's functional outcome.   REHAB POTENTIAL: Good  CLINICAL DECISION MAKING: Evolving/moderate complexity  EVALUATION COMPLEXITY: Moderate   GOALS: Goals reviewed with patient? Yes  SHORT TERM GOALS: Target date: 10/12/2023  Pt will be ind with initial HEP Baseline: Goal status: INITIAL  2.  Pt will have improved R knee ROM to >/=5-90 deg Baseline:  Goal status: INITIAL  3.  Pt will have improved 5x STS to </=15 sec to demo increasing functional LE strength Baseline:  Goal status: INITIAL    LONG TERM GOALS: Target date: 11/09/2023   Pt will be ind with management and progression of HEP Baseline:  Goal status:  INITIAL  2.  Pt will have improved 5x STS to </=13 sec to be considered a low fall risk Baseline:  Goal status: INITIAL  3.  Pt will have improved TUG with LRAD to </=15 sec to demo decreased fall risk Baseline:  Goal status: INITIAL  4.  Pt will be able to amb limited community distances (at least 500') independently Baseline:  Goal status: INITIAL  5.  Pt will have improved LEFS to >/=25/80 to demo MCID for TKA Baseline: 16 Goal status: INITIAL  6.  Pt will have improved knee ROM to >/=5-110 deg to safely negotiate stairs within the community Baseline:  Goal status: INITIAL   PLAN:  PT FREQUENCY: 2x/week  PT DURATION: 8 weeks  PLANNED INTERVENTIONS: [id number]- PT Re-evaluation, 97750- Physical Performance Testing, 97110-Therapeutic exercises, 97530- Therapeutic activity, 97112- Neuromuscular re-education, 97535- Self Care, [id number]- Manual therapy, [id number]- Gait training, [phone]- Aquatic Therapy, [phone]- Ultrasound, 79439 (1-2 muscles), [phone] (3+ muscles)- Dry Needling, Patient/Family education, Balance training, Stair training, Taping, Joint mobilization, Scar mobilization, Cryotherapy, and Moist heat   [employer] medicaid -- doesn't cover vaso, estim, ionto  PLAN FOR NEXT  SESSION: Assess response to HEP. Work on knee ROM, quad and hip strength.    Via Rosado April Ma L Betsi Crespi, PT, DPT 09/20/2023, 11:00 AM  For all possible CPT codes, reference the Planned Interventions line above.     Check all conditions that are expected to impact treatment: {Conditions expected to impact treatment:Musculoskeletal disorders   If treatment provided at initial evaluation, no treatment charged due to lack of authorization.

## 2023-09-21 ENCOUNTER — Other Ambulatory Visit (HOSPITAL_COMMUNITY): Payer: Self-pay

## 2023-09-22 ENCOUNTER — Ambulatory Visit: Admitting: Physical Therapy

## 2023-09-22 ENCOUNTER — Encounter: Payer: Self-pay | Admitting: Physical Therapy

## 2023-09-22 DIAGNOSIS — M25661 Stiffness of right knee, not elsewhere classified: Secondary | ICD-10-CM

## 2023-09-22 DIAGNOSIS — M25561 Pain in right knee: Secondary | ICD-10-CM

## 2023-09-22 DIAGNOSIS — R262 Difficulty in walking, not elsewhere classified: Secondary | ICD-10-CM

## 2023-09-22 DIAGNOSIS — M6281 Muscle weakness (generalized): Secondary | ICD-10-CM

## 2023-09-22 NOTE — Therapy (Addendum)
 OUTPATIENT PHYSICAL THERAPY LOWER EXTREMITY TREATMENT   Patient Name: [name] MRN: [id number] DOB:08-Apr-1960, 63 y.o., female Today's Date: 09/22/2023  END OF SESSION:  PT End of Session - 09/22/23 1034     Visit Number 3    Number of Visits 16    Date for PT Re-Evaluation 11/09/23    Authorization Type [employer]    Authorization Time Period Approved 13 visits 09/14/23-12/13/23    Authorization - Visit Number 2    Authorization - Number of Visits 13    PT Start Time 1040    PT Stop Time 1120    PT Time Calculation (min) 40 min    Activity Tolerance Patient tolerated treatment well           Past Medical History:  Diagnosis Date   Anemia when she was young   Arthritis    Congestion of nasal sinus    Constipation    takes Colace every other day   DDD (degenerative disc disease), lumbar    Dependent edema    Dry eyes    uses Systane Eye drops daily as needed   GERD (gastroesophageal reflux disease) 12/06/2011   takes Nexium  daily   Headache(784.0)    occasionally-d/t congestion   History of bronchitis 6-7 yrs ago   History of kidney stones    Hyperlipidemia    Hypertension    takes Metoprolol  and Diovan  daily   Hypothyroidism 12/06/2011   takes Synthroid  daily   Joint pain    Multiple allergies    takes Zyrtec daily;uses Nasonex daily as needed   Nocturia    Numbness    weakness-right hand   Onychomycosis    OSA on CPAP    Wears CPAP Nightly   Pancreatitis    Peripheral edema    takes Furosemide  daily   Pre-diabetes    S/P insertion of spinal cord stimulator    Shingles    Urinary frequency    Venous insufficiency of leg    Past Surgical History:  Procedure Laterality Date   ABDOMINAL HYSTERECTOMY     uterine prolapse   BACK SURGERY     L4-5   CARDIAC CATHETERIZATION  2007   CATARACT EXTRACTION, BILATERAL  2023   CHOLECYSTECTOMY     COLONOSCOPY     ESOPHAGOGASTRODUODENOSCOPY  5-41yrs ago   FOOT SURGERY Bilateral    KNEE  ARTHROSCOPY Right    LAMINECTOMY THORACIC SPINE W/ PLACEMENT SPINAL CORD STIMULATOR     LAPAROSCOPIC GASTRIC SLEEVE RESECTION N/A 08/23/2016   Procedure: LAPAROSCOPIC GASTRIC SLEEVE RESECTION WITH UPPER ENDO;  Surgeon: Gladis Cough, MD;  Location: [facility];  Service: General;  Laterality: N/A;   POSTERIOR LUMBAR FUSION  03/10/2009   L3-4 and L3-4 fusion, Dr. Catalina Stains   SHOULDER ARTHROSCOPY WITH ROTATOR CUFF REPAIR AND SUBACROMIAL DECOMPRESSION Right 08/23/2013   Procedure: RIGHT SHOULDER ARTHROSCOPY WITH  SUBACROMIAL DECOMPRESSION DISTAL CLAVICLE RESECTION AND  ROTATOR CUFF REPAIR ;  Surgeon: Franky CHRISTELLA Pointer, MD;  Location: [facility];  Service: Orthopedics;  Laterality: Right;   TOTAL KNEE ARTHROPLASTY Right 08/29/2023   Procedure: ARTHROPLASTY, KNEE, TOTAL;  Surgeon: Jerri Kay CHRISTELLA, MD;  Location: [facility];  Service: Orthopedics;  Laterality: Right;   TUBAL LIGATION     Patient Active Problem List   Diagnosis Date Noted   S/P TKR (total knee replacement), right 08/30/2023   Status post total right knee replacement 08/29/2023   Primary osteoarthritis of right knee 05/03/2023   Primary osteoarthritis of left  knee 05/03/2023   Memory change 12/25/2020   Chronic pain syndrome 05/04/2019   Raynaud's disease 04/13/2019   Lumbar radiculopathy 12/18/2018   Spinal stenosis of lumbar region with neurogenic claudication 12/18/2018   Post laminectomy syndrome 12/18/2018   Bilateral primary osteoarthritis of knee 12/27/2016   History of excision of intestinal structure 09/07/2016   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 08/23/2016   Prediabetes 12/03/2015   OSA on CPAP 02/19/2015   Lung field abnormal 11/11/2014   Morbid obesity (HCC) 10/09/2014   Constipation 06/14/2014   Peripheral venous insufficiency 05/25/2013   Pulmonary HTN (HCC) 05/12/2013   Bilateral lower extremity edema 05/12/2013   Morbid obesity with BMI of 50.0-59.9, adult (HCC)    Shoulder joint pain 03/28/2013   Abnormal  glucose level 02/20/2013   Alopecia 02/20/2013   Degeneration of lumbar intervertebral disc 02/20/2013   Hyperglycemia due to type 2 diabetes mellitus (HCC) 02/20/2013   Type 2 diabetes mellitus with other specified complication (HCC) 02/20/2013   Back pain, hx of prior back surgery 12/09/2011   Pancreatitis, mild, CT negative 12/08/2011   Fatty liver 12/08/2011   Substernal chest pain, suspected secondary to acute pancreatitis 12/06/2011   Hypertension 12/06/2011   Hypokalemia 12/06/2011   Hypothyroidism 12/06/2011   Anxiety 12/06/2011   BV (bacterial vaginosis)    Hyperlipidemia     PCP: Larnell Hamilton, MD  REFERRING PROVIDER: Jerri Kay HERO, MD  REFERRING DIAG: [phone] (ICD-10-CM) - S/P total knee arthroplasty, right  THERAPY DIAG:  Acute pain of right knee  Stiffness of right knee, not elsewhere classified  Difficulty in walking, not elsewhere classified  Muscle weakness (generalized)  Rationale for Evaluation and Treatment: Rehabilitation  ONSET DATE: 08/29/23  SUBJECTIVE:   SUBJECTIVE STATEMENT: Pt states her knee is sore.   From eval: Pt reports it's been hurting really bad. Pt's daughter and granddaughter are currently at home helping her. Pt reports she got 1 week of HHPT. Has been doing the exercises provided to her. Had been using the cane prior to surgery but left it in [place] . Has been using RW in the house currently.   PERTINENT HISTORY: Arthritis, DDD, HTN, spinal cord stimulator. Eval and treat right knee. S/p right TKA 08/29/2023  PAIN:  Are you having pain? Yes: NPRS scale: 5/10 currently, can get up to a 10/10 Pain location: Front of R knee Pain description: Aching and throbbing Aggravating factors: Standing/walking, sometimes sitting Relieving factors: Ice, pain medication  PRECAUTIONS: None  RED FLAGS: None   WEIGHT BEARING RESTRICTIONS: No  FALLS:  Has patient fallen in last 6 months? No  LIVING ENVIRONMENT: Lives with:  granddaughter Lives in: House/apartment Stairs: No; 2 steps to porch Has following equipment at home: Vannie - 2 wheeled and bed side commode  OCCUPATION: Retired - likes to watch TV, go out to eat  PLOF: Independent  PATIENT GOALS: Wants to get off using any a/d  NEXT MD VISIT: 09/15/23  OBJECTIVE:  Note: Objective measures were completed at Evaluation unless otherwise noted.  DIAGNOSTIC FINDINGS: n/a  PATIENT SURVEYS:  Lower Extremity Functional Score: 16 / 80 = 20.0 %  COGNITION: Overall cognitive status: Within functional limits for tasks assessed     SENSATION: WFL no overt N/T  EDEMA:  Unable to accurately measure due to prevena wound vac not properly sealing down  MUSCLE LENGTH: Hamstrings: see knee ROM Thomas test: did not assess  POSTURE: flexed trunk   PALPATION: TTP along R knee joint line  LOWER EXTREMITY ROM:  Active ROM Right eval Left eval Right 09/22/23  Hip flexion     Hip extension     Hip abduction     Hip adduction     Hip internal rotation     Hip external rotation     Knee flexion 73 sitting 100 sitting 87 sitting  Knee extension -15 sitting -10 sitting -10 LAQ -8 quad set  Ankle dorsiflexion     Ankle plantarflexion     Ankle inversion     Ankle eversion      (Blank rows = not tested)  LOWER EXTREMITY MMT:  MMT Right eval Left eval  Hip flexion 4 4  Hip extension    Hip abduction supine 3+ 4  Hip adduction    Hip internal rotation    Hip external rotation    Knee flexion 4 5  Knee extension 4 5  Ankle dorsiflexion    Ankle plantarflexion    Ankle inversion    Ankle eversion     (Blank rows = not tested)  LOWER EXTREMITY SPECIAL TESTS:  Did not assess  FUNCTIONAL TESTS:  5x STS: 20.74 sec with UE  TUG: 39.38 sec with RW   GAIT: Distance walked: Into clinic Assistive device utilized: Environmental consultant - 2 wheeled Level of assistance: Modified independence Comments: Antalgic, trunk flexed, decreased R hip ext    [facility]  Adult PT Treatment:                                                DATE: 09/22/23 Therapeutic Exercise: Nustep L2 UEs/LEs 2x5 min Seated knee flexion self stretch 3x10 Seated knee flex/ext dangling/swinging x30 Seated LAQ eccentrics 2x10 Seated hip flexion 2x10 Seated quad set 2x10 Seated heel/toe raise 2x10 Sit<>stand x5 Manual Therapy: Scar massage and how to perform self massage at home TPR proximal quad Gait: Amb 2x 200' RW SBA. Cues for heel/toe pattern, symmetrical steps, slow, trunk slightly flexed                                                                                                                               [facility] Adult PT Treatment:                                                DATE: 09/20/23 Therapeutic Exercise: Seated heel slides x20 Seated knee flexion self stretch 3x10 Supine hip flexor stretch x 30 Supine hip flexor/quad stretch with strap x 30 Supine hamstring stretch x 30 Supine quad set 3x10 Supine clamshell red TB 2x10 Supine beginner bridge x15 (pt found this too painful) Standing hip ext 2x10 Manual Therapy: Scar massage TPR proximal quad Gait: Amb x 100' RW SBA. Cues  for heel/toe pattern, symmetrical steps  Modalities: Vaso R knee medium compression for edema, 40 deg temp, bilat LEs elevated x10 min at end of session (did not bill) Self Care: Self massage/MFR/TPR quad muscle Scar massage   09/14/23 See HEP below    PATIENT EDUCATION:  Education details: HEP, self care, s/s of blood clots Person educated: Patient Education method: Explanation, Demonstration, and Handouts Education comprehension: verbalized understanding, returned demonstration, and needs further education  HOME EXERCISE PROGRAM: Access Code: [email or web address] URL: [email or web address] Date: 09/14/2023 Prepared by: Wasyl Dornfeld April Earnie Starring  Exercises - Supine Heel Slide with Strap  - 1 x daily - 7 x weekly - 2 sets - 10 reps - Hooklying Bilateral  Isometric Clamshell  - 1 x daily - 7 x weekly - 2 sets - 10 reps - Supine Bridge  - 1 x daily - 7 x weekly - 2 sets - 10 reps - Supine Quad Set  - 1 x daily - 7 x weekly - 2 sets - 10 reps  ASSESSMENT:  CLINICAL IMPRESSION: Fatigued and SHOB with ambulating from waiting room into clinic. Discussed s/s of blood clots. Working on improving endurance and strength. Pt's knee ROM is progressing well. Quad trigger point has decreased. Reinforced scar massage for home and reciprocal pattern with gait.   From eval: Patient is a 63 y.o. F who was seen today for physical therapy evaluation and treatment s/p R TKA on 08/29/23. Pt has received 1 week of home health and states she has been compliant with her HEP. Assessment indicates limited R knee ROM, decreased strength (especially in quad), and decreased balance affecting pt's safe mobility for home and in the community. TUG and 5x STS scores both indicate a high risk of falls. Pt will benefit from PT to improve on these issues, decrease pain and maximize level of function.   OBJECTIVE IMPAIRMENTS: Abnormal gait, decreased activity tolerance, decreased balance, decreased endurance, decreased mobility, difficulty walking, decreased ROM, decreased strength, hypomobility, increased fascial restrictions, increased muscle spasms, impaired flexibility, improper body mechanics, postural dysfunction, and pain.   ACTIVITY LIMITATIONS: carrying, lifting, bending, sitting, standing, squatting, sleeping, stairs, transfers, bed mobility, bathing, toileting, dressing, hygiene/grooming, and locomotion level  PARTICIPATION LIMITATIONS: meal prep, cleaning, laundry, driving, shopping, and community activity  PERSONAL FACTORS: Age, Fitness, Past/current experiences, and Time since onset of injury/illness/exacerbation are also affecting patient's functional outcome.   REHAB POTENTIAL: Good  CLINICAL DECISION MAKING: Evolving/moderate complexity  EVALUATION COMPLEXITY:  Moderate   GOALS: Goals reviewed with patient? Yes  SHORT TERM GOALS: Target date: 10/12/2023  Pt will be ind with initial HEP Baseline: Goal status: INITIAL  2.  Pt will have improved R knee ROM to >/=5-90 deg Baseline:  Goal status: INITIAL  3.  Pt will have improved 5x STS to </=15 sec to demo increasing functional LE strength Baseline:  Goal status: INITIAL    LONG TERM GOALS: Target date: 11/09/2023   Pt will be ind with management and progression of HEP Baseline:  Goal status: INITIAL  2.  Pt will have improved 5x STS to </=13 sec to be considered a low fall risk Baseline:  Goal status: INITIAL  3.  Pt will have improved TUG with LRAD to </=15 sec to demo decreased fall risk Baseline:  Goal status: INITIAL  4.  Pt will be able to amb limited community distances (at least 500') independently Baseline:  Goal status: INITIAL  5.  Pt will have improved LEFS to >/=25/80 to demo MCID for TKA  Baseline: 16 Goal status: INITIAL  6.  Pt will have improved knee ROM to >/=5-110 deg to safely negotiate stairs within the community Baseline:  Goal status: INITIAL   PLAN:  PT FREQUENCY: 2x/week  PT DURATION: 8 weeks  PLANNED INTERVENTIONS: 97164- PT Re-evaluation, 97750- Physical Performance Testing, 97110-Therapeutic exercises, 97530- Therapeutic activity, 97112- Neuromuscular re-education, 97535- Self Care, [id number]- Manual therapy, [id number]- Gait training, [phone]- Aquatic Therapy, [phone]- Ultrasound, 79439 (1-2 muscles), 20561 (3+ muscles)- Dry Needling, Patient/Family education, Balance training, Stair training, Taping, Joint mobilization, Scar mobilization, Cryotherapy, and Moist heat   [employer] medicaid -- doesn't cover vaso, estim, ionto  PLAN FOR NEXT SESSION: Assess response to HEP. Work on knee ROM, quad and hip strength. Endurance - please check pulse ox next visit   Opal Mckellips April Ma L Terrisa Curfman, PT, DPT 09/22/2023, 10:35 AM  For all possible CPT codes,  reference the Planned Interventions line above.     Check all conditions that are expected to impact treatment: {Conditions expected to impact treatment:Musculoskeletal disorders   If treatment provided at initial evaluation, no treatment charged due to lack of authorization.

## 2023-09-27 ENCOUNTER — Ambulatory Visit: Attending: Internal Medicine

## 2023-09-27 DIAGNOSIS — M6281 Muscle weakness (generalized): Secondary | ICD-10-CM | POA: Insufficient documentation

## 2023-09-27 DIAGNOSIS — M25561 Pain in right knee: Secondary | ICD-10-CM | POA: Diagnosis present

## 2023-09-27 DIAGNOSIS — R262 Difficulty in walking, not elsewhere classified: Secondary | ICD-10-CM | POA: Diagnosis present

## 2023-09-27 DIAGNOSIS — M25661 Stiffness of right knee, not elsewhere classified: Secondary | ICD-10-CM | POA: Insufficient documentation

## 2023-09-27 NOTE — Therapy (Signed)
 OUTPATIENT PHYSICAL THERAPY TREATMENT NOTE   Patient Name: [name] MRN: [id number] DOB:[birth year], 63 y.o., female Today's Date: 09/27/2023  END OF SESSION:  PT End of Session - 09/27/23 1731     Visit Number 4    Number of Visits 16    Date for PT Re-Evaluation 11/09/23    Authorization Type [employer]    Authorization Time Period Approved 13 visits 09/14/23-12/13/23    Authorization - Visit Number 3    Authorization - Number of Visits 13    PT Start Time 1132    PT Stop Time 1212    PT Time Calculation (min) 40 min    Activity Tolerance Patient tolerated treatment well    Behavior During Therapy WFL for tasks assessed/performed            Past Medical History:  Diagnosis Date   Anemia when she was young   Arthritis    Congestion of nasal sinus    Constipation    takes Colace every other day   DDD (degenerative disc disease), lumbar    Dependent edema    Dry eyes    uses Systane Eye drops daily as needed   GERD (gastroesophageal reflux disease) 12/06/2011   takes Nexium  daily   Headache(784.0)    occasionally-d/t congestion   History of bronchitis 6-7 yrs ago   History of kidney stones    Hyperlipidemia    Hypertension    takes Metoprolol  and Diovan  daily   Hypothyroidism 12/06/2011   takes Synthroid  daily   Joint pain    Multiple allergies    takes Zyrtec daily;uses Nasonex daily as needed   Nocturia    Numbness    weakness-right hand   Onychomycosis    OSA on CPAP    Wears CPAP Nightly   Pancreatitis    Peripheral edema    takes Furosemide  daily   Pre-diabetes    S/P insertion of spinal cord stimulator    Shingles    Urinary frequency    Venous insufficiency of leg    Past Surgical History:  Procedure Laterality Date   ABDOMINAL HYSTERECTOMY     uterine prolapse   BACK SURGERY     L4-5   CARDIAC CATHETERIZATION  2007   CATARACT EXTRACTION, BILATERAL  2023   CHOLECYSTECTOMY     COLONOSCOPY      ESOPHAGOGASTRODUODENOSCOPY  5-69yrs ago   FOOT SURGERY Bilateral    KNEE ARTHROSCOPY Right    LAMINECTOMY THORACIC SPINE W/ PLACEMENT SPINAL CORD STIMULATOR     LAPAROSCOPIC GASTRIC SLEEVE RESECTION N/A 08/23/2016   Procedure: LAPAROSCOPIC GASTRIC SLEEVE RESECTION WITH UPPER ENDO;  Surgeon: Gladis Cough, MD;  Location: [facility];  Service: General;  Laterality: N/A;   POSTERIOR LUMBAR FUSION  03/10/2009   L3-4 and L3-4 fusion, Dr. Catalina Stains   SHOULDER ARTHROSCOPY WITH ROTATOR CUFF REPAIR AND SUBACROMIAL DECOMPRESSION Right 08/23/2013   Procedure: RIGHT SHOULDER ARTHROSCOPY WITH  SUBACROMIAL DECOMPRESSION DISTAL CLAVICLE RESECTION AND  ROTATOR CUFF REPAIR ;  Surgeon: Franky CHRISTELLA Pointer, MD;  Location: [facility];  Service: Orthopedics;  Laterality: Right;   TOTAL KNEE ARTHROPLASTY Right 08/29/2023   Procedure: ARTHROPLASTY, KNEE, TOTAL;  Surgeon: Jerri Kay CHRISTELLA, MD;  Location: [facility];  Service: Orthopedics;  Laterality: Right;   TUBAL LIGATION     Patient Active Problem List   Diagnosis Date Noted   S/P TKR (total knee replacement), right 08/30/2023   Status post total right knee replacement 08/29/2023   Primary osteoarthritis  of right knee 05/03/2023   Primary osteoarthritis of left knee 05/03/2023   Memory change 12/25/2020   Chronic pain syndrome 05/04/2019   Raynaud's disease 04/13/2019   Lumbar radiculopathy 12/18/2018   Spinal stenosis of lumbar region with neurogenic claudication 12/18/2018   Post laminectomy syndrome 12/18/2018   Bilateral primary osteoarthritis of knee 12/27/2016   History of excision of intestinal structure 09/07/2016   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 08/23/2016   Prediabetes 12/03/2015   OSA on CPAP 02/19/2015   Lung field abnormal 11/11/2014   Morbid obesity (HCC) 10/09/2014   Constipation 06/14/2014   Peripheral venous insufficiency 05/25/2013   Pulmonary HTN (HCC) 05/12/2013   Bilateral lower extremity edema 05/12/2013   Morbid obesity with BMI  of 50.0-59.9, adult (HCC)    Shoulder joint pain 03/28/2013   Abnormal glucose level 02/20/2013   Alopecia 02/20/2013   Degeneration of lumbar intervertebral disc 02/20/2013   Hyperglycemia due to type 2 diabetes mellitus (HCC) 02/20/2013   Type 2 diabetes mellitus with other specified complication (HCC) 02/20/2013   Back pain, hx of prior back surgery 12/09/2011   Pancreatitis, mild, CT negative 12/08/2011   Fatty liver 12/08/2011   Substernal chest pain, suspected secondary to acute pancreatitis 12/06/2011   Hypertension 12/06/2011   Hypokalemia 12/06/2011   Hypothyroidism 12/06/2011   Anxiety 12/06/2011   BV (bacterial vaginosis)    Hyperlipidemia     PCP: Larnell Hamilton, MD  REFERRING PROVIDER: Jerri Kay HERO, MD  REFERRING DIAG: [phone] (ICD-10-CM) - S/P total knee arthroplasty, right  THERAPY DIAG:  Acute pain of right knee  Stiffness of right knee, not elsewhere classified  Difficulty in walking, not elsewhere classified  Muscle weakness (generalized)  Rationale for Evaluation and Treatment: Rehabilitation  ONSET DATE: 08/29/23  SUBJECTIVE:   SUBJECTIVE STATEMENT: Patient reporting positive improvement since starting PT.  From eval: Pt reports it's been hurting really bad. Pt's daughter and granddaughter are currently at home helping her. Pt reports she got 1 week of HHPT. Has been doing the exercises provided to her. Had been using the cane prior to surgery but left it in [place] . Has been using RW in the house currently.   PERTINENT HISTORY: Arthritis, DDD, HTN, spinal cord stimulator. Eval and treat right knee. S/p right TKA 08/29/2023  PAIN:  Are you having pain? Yes: NPRS scale: 5/10 currently, can get up to a 10/10 Pain location: Front of R knee Pain description: Aching and throbbing Aggravating factors: Standing/walking, sometimes sitting Relieving factors: Ice, pain medication  PRECAUTIONS: None  RED FLAGS: None   WEIGHT BEARING RESTRICTIONS:  No  FALLS:  Has patient fallen in last 6 months? No  LIVING ENVIRONMENT: Lives with: granddaughter Lives in: House/apartment Stairs: No; 2 steps to porch Has following equipment at home: Vannie - 2 wheeled and bed side commode  OCCUPATION: Retired - likes to watch TV, go out to eat  PLOF: Independent  PATIENT GOALS: Wants to get off using any a/d  NEXT MD VISIT: 09/15/23  OBJECTIVE:  Note: Objective measures were completed at Evaluation unless otherwise noted.  DIAGNOSTIC FINDINGS: n/a  PATIENT SURVEYS:  Lower Extremity Functional Score: 16 / 80 = 20.0 %  COGNITION: Overall cognitive status: Within functional limits for tasks assessed     SENSATION: WFL no overt N/T  EDEMA:  Unable to accurately measure due to prevena wound vac not properly sealing down  MUSCLE LENGTH: Hamstrings: see knee ROM Thomas test: did not assess  POSTURE: flexed trunk   PALPATION:  TTP along R knee joint line  LOWER EXTREMITY ROM:  Active ROM Right eval Left eval Right 09/22/23  Hip flexion     Hip extension     Hip abduction     Hip adduction     Hip internal rotation     Hip external rotation     Knee flexion 73 sitting 100 sitting 87 sitting  Knee extension -15 sitting -10 sitting -10 LAQ -8 quad set  Ankle dorsiflexion     Ankle plantarflexion     Ankle inversion     Ankle eversion      (Blank rows = not tested)  LOWER EXTREMITY MMT:  MMT Right eval Left eval  Hip flexion 4 4  Hip extension    Hip abduction supine 3+ 4  Hip adduction    Hip internal rotation    Hip external rotation    Knee flexion 4 5  Knee extension 4 5  Ankle dorsiflexion    Ankle plantarflexion    Ankle inversion    Ankle eversion     (Blank rows = not tested)  LOWER EXTREMITY SPECIAL TESTS:  Did not assess  FUNCTIONAL TESTS:  5x STS: 20.74 sec with UE  TUG: 39.38 sec with RW   GAIT: Distance walked: Into clinic Assistive device utilized: Environmental consultant - 2 wheeled Level of  assistance: Modified independence Comments: Antalgic, trunk flexed, decreased R hip ext  TODAY'S TREATMENT:   [facility] Adult PT Treatment:                                                DATE: 09/27/2023  Therapeutic Exercise: Nustep L6 UEs/LEs x5 min Seated knee flex/ext dangling/swinging 2x30 Seated LAQ eccentrics 2x10 Seated hip flexion 2x10 Seated quad set 2x10 Seated heel/toe raise 2 x 30   Manual Therapy: Scar massage and how to perform self massage at home  Therapeutic Activity:  Sit<>stand 2x5 Amb 2x 200' RW SBA. Cues for heel/toe pattern, symmetrical steps, slow, trunk extension       PATIENT EDUCATION:  Education details: HEP, self care, s/s of blood clots Person educated: Patient Education method: Explanation, Demonstration, and Handouts Education comprehension: verbalized understanding, returned demonstration, and needs further education  HOME EXERCISE PROGRAM: Access Code: [email or web address] URL: [email or web address] Date: 09/14/2023 Prepared by: Christene April Earnie Starring  Exercises - Supine Heel Slide with Strap  - 1 x daily - 7 x weekly - 2 sets - 10 reps - Hooklying Bilateral Isometric Clamshell  - 1 x daily - 7 x weekly - 2 sets - 10 reps - Supine Bridge  - 1 x daily - 7 x weekly - 2 sets - 10 reps - Supine Quad Set  - 1 x daily - 7 x weekly - 2 sets - 10 reps  ASSESSMENT:  CLINICAL IMPRESSION: 09/27/2023 Haly had fair tolerance of today's treatment session, which focused on gradual progression of strengthening program. She continues to demonstrate forward flexed posture at hips while using RW and will benefit from additional time with gait training. We will continue to progress per POC as tolerated, in order to reach established rehab goals.    From eval: Patient is a 63 y.o. F who was seen today for physical therapy evaluation and treatment s/p R TKA on 08/29/23. Pt has received 1 week of home health and states she has been compliant with her  HEP.  Assessment indicates limited R knee ROM, decreased strength (especially in quad), and decreased balance affecting pt's safe mobility for home and in the community. TUG and 5x STS scores both indicate a high risk of falls. Pt will benefit from PT to improve on these issues, decrease pain and maximize level of function.   OBJECTIVE IMPAIRMENTS: Abnormal gait, decreased activity tolerance, decreased balance, decreased endurance, decreased mobility, difficulty walking, decreased ROM, decreased strength, hypomobility, increased fascial restrictions, increased muscle spasms, impaired flexibility, improper body mechanics, postural dysfunction, and pain.   ACTIVITY LIMITATIONS: carrying, lifting, bending, sitting, standing, squatting, sleeping, stairs, transfers, bed mobility, bathing, toileting, dressing, hygiene/grooming, and locomotion level  PARTICIPATION LIMITATIONS: meal prep, cleaning, laundry, driving, shopping, and community activity  PERSONAL FACTORS: Age, Fitness, Past/current experiences, and Time since onset of injury/illness/exacerbation are also affecting patient's functional outcome.   REHAB POTENTIAL: Good  CLINICAL DECISION MAKING: Evolving/moderate complexity  EVALUATION COMPLEXITY: Moderate   GOALS: Goals reviewed with patient? Yes  SHORT TERM GOALS: Target date: 10/12/2023  Pt will be ind with initial HEP Baseline: Goal status: INITIAL  2.  Pt will have improved R knee ROM to >/=5-90 deg Baseline:  Goal status: INITIAL  3.  Pt will have improved 5x STS to </=15 sec to demo increasing functional LE strength Baseline:  Goal status: INITIAL    LONG TERM GOALS: Target date: 11/09/2023   Pt will be ind with management and progression of HEP Baseline:  Goal status: INITIAL  2.  Pt will have improved 5x STS to </=13 sec to be considered a low fall risk Baseline:  Goal status: INITIAL  3.  Pt will have improved TUG with LRAD to </=15 sec to demo decreased fall  risk Baseline:  Goal status: INITIAL  4.  Pt will be able to amb limited community distances (at least 500') independently Baseline:  Goal status: INITIAL  5.  Pt will have improved LEFS to >/=25/80 to demo MCID for TKA Baseline: 16 Goal status: INITIAL  6.  Pt will have improved knee ROM to >/=5-110 deg to safely negotiate stairs within the community Baseline:  Goal status: INITIAL   PLAN:  PT FREQUENCY: 2x/week  PT DURATION: 8 weeks  PLANNED INTERVENTIONS: [id number]- PT Re-evaluation, 97750- Physical Performance Testing, 97110-Therapeutic exercises, 97530- Therapeutic activity, 97112- Neuromuscular re-education, 97535- Self Care, [id number]- Manual therapy, [id number]- Gait training, [phone]- Aquatic Therapy, [phone]- Ultrasound, 79439 (1-2 muscles), [id number] (3+ muscles)- Dry Needling, Patient/Family education, Balance training, Stair training, Taping, Joint mobilization, Scar mobilization, Cryotherapy, and Moist heat   [employer] med[employer] -- doesn't cover vaso, estim, ionto  PLAN FOR NEXT SESSION: Assess response to HEP. Work on knee ROM, quad and hip strength. Endurance - please check pulse ox next visit   Marko Molt, PT, DPT  09/27/2023 5:37 PM   For all possible CPT codes, reference the Planned Interventions line above.     Check all conditions that are expected to impact treatment: {Conditions expected to impact treatment:Musculoskeletal disorders   If treatment provided at initial evaluation, no treatment charged due to lack of authorization.

## 2023-10-06 ENCOUNTER — Ambulatory Visit

## 2023-10-06 DIAGNOSIS — R262 Difficulty in walking, not elsewhere classified: Secondary | ICD-10-CM

## 2023-10-06 DIAGNOSIS — M25661 Stiffness of right knee, not elsewhere classified: Secondary | ICD-10-CM

## 2023-10-06 DIAGNOSIS — M25561 Pain in right knee: Secondary | ICD-10-CM

## 2023-10-06 DIAGNOSIS — M6281 Muscle weakness (generalized): Secondary | ICD-10-CM

## 2023-10-06 NOTE — Therapy (Signed)
 OUTPATIENT PHYSICAL THERAPY TREATMENT NOTE   Patient Name: [name] MRN: [id number] DOB:Jul 29, 1960, 63 y.o., female Today's Date: 10/06/2023  END OF SESSION:  PT End of Session - 10/06/23 0930     Visit Number 5    Number of Visits 16    Date for PT Re-Evaluation 11/09/23    Authorization Type [employer]    Authorization Time Period Approved 13 visits 09/14/23-12/13/23    Authorization - Visit Number 4    Authorization - Number of Visits 13    PT Start Time 0930   Pt arrived late   PT Stop Time 1016    PT Time Calculation (min) 46 min    Activity Tolerance Patient tolerated treatment well    Behavior During Therapy WFL for tasks assessed/performed         Past Medical History:  Diagnosis Date   Anemia when she was young   Arthritis    Congestion of nasal sinus    Constipation    takes Colace every other day   DDD (degenerative disc disease), lumbar    Dependent edema    Dry eyes    uses Systane Eye drops daily as needed   GERD (gastroesophageal reflux disease) 12/06/2011   takes Nexium  daily   Headache(784.0)    occasionally-d/t congestion   History of bronchitis 6-7 yrs ago   History of kidney stones    Hyperlipidemia    Hypertension    takes Metoprolol  and Diovan  daily   Hypothyroidism 12/06/2011   takes Synthroid  daily   Joint pain    Multiple allergies    takes Zyrtec daily;uses Nasonex daily as needed   Nocturia    Numbness    weakness-right hand   Onychomycosis    OSA on CPAP    Wears CPAP Nightly   Pancreatitis    Peripheral edema    takes Furosemide  daily   Pre-diabetes    S/P insertion of spinal cord stimulator    Shingles    Urinary frequency    Venous insufficiency of leg    Past Surgical History:  Procedure Laterality Date   ABDOMINAL HYSTERECTOMY     uterine prolapse   BACK SURGERY     L4-5   CARDIAC CATHETERIZATION  2007   CATARACT EXTRACTION, BILATERAL  2023   CHOLECYSTECTOMY     COLONOSCOPY      ESOPHAGOGASTRODUODENOSCOPY  5-20yrs ago   FOOT SURGERY Bilateral    KNEE ARTHROSCOPY Right    LAMINECTOMY THORACIC SPINE W/ PLACEMENT SPINAL CORD STIMULATOR     LAPAROSCOPIC GASTRIC SLEEVE RESECTION N/A 08/23/2016   Procedure: LAPAROSCOPIC GASTRIC SLEEVE RESECTION WITH UPPER ENDO;  Surgeon: Gladis Cough, MD;  Location: [facility];  Service: General;  Laterality: N/A;   POSTERIOR LUMBAR FUSION  03/10/2009   L3-4 and L3-4 fusion, Dr. Catalina Stains   SHOULDER ARTHROSCOPY WITH ROTATOR CUFF REPAIR AND SUBACROMIAL DECOMPRESSION Right 08/23/2013   Procedure: RIGHT SHOULDER ARTHROSCOPY WITH  SUBACROMIAL DECOMPRESSION DISTAL CLAVICLE RESECTION AND  ROTATOR CUFF REPAIR ;  Surgeon: Franky CHRISTELLA Pointer, MD;  Location: [facility];  Service: Orthopedics;  Laterality: Right;   TOTAL KNEE ARTHROPLASTY Right 08/29/2023   Procedure: ARTHROPLASTY, KNEE, TOTAL;  Surgeon: Jerri Kay CHRISTELLA, MD;  Location: [facility];  Service: Orthopedics;  Laterality: Right;   TUBAL LIGATION     Patient Active Problem List   Diagnosis Date Noted   S/P TKR (total knee replacement), right 08/30/2023   Status post total right knee replacement 08/29/2023   Primary  osteoarthritis of right knee 05/03/2023   Primary osteoarthritis of left knee 05/03/2023   Memory change 12/25/2020   Chronic pain syndrome 05/04/2019   Raynaud's disease 04/13/2019   Lumbar radiculopathy 12/18/2018   Spinal stenosis of lumbar region with neurogenic claudication 12/18/2018   Post laminectomy syndrome 12/18/2018   Bilateral primary osteoarthritis of knee 12/27/2016   History of excision of intestinal structure 09/07/2016   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 08/23/2016   Prediabetes 12/03/2015   OSA on CPAP 02/19/2015   Lung field abnormal 11/11/2014   Morbid obesity (HCC) 10/09/2014   Constipation 06/14/2014   Peripheral venous insufficiency 05/25/2013   Pulmonary HTN (HCC) 05/12/2013   Bilateral lower extremity edema 05/12/2013   Morbid obesity with BMI  of 50.0-59.9, adult (HCC)    Shoulder joint pain 03/28/2013   Abnormal glucose level 02/20/2013   Alopecia 02/20/2013   Degeneration of lumbar intervertebral disc 02/20/2013   Hyperglycemia due to type 2 diabetes mellitus (HCC) 02/20/2013   Type 2 diabetes mellitus with other specified complication (HCC) 02/20/2013   Back pain, hx of prior back surgery 12/09/2011   Pancreatitis, mild, CT negative 12/08/2011   Fatty liver 12/08/2011   Substernal chest pain, suspected secondary to acute pancreatitis 12/06/2011   Hypertension 12/06/2011   Hypokalemia 12/06/2011   Hypothyroidism 12/06/2011   Anxiety 12/06/2011   BV (bacterial vaginosis)    Hyperlipidemia     PCP: Larnell Hamilton, MD  REFERRING PROVIDER: Jerri Kay HERO, MD  REFERRING DIAG: [phone] (ICD-10-CM) - S/P total knee arthroplasty, right  THERAPY DIAG:  Acute pain of right knee  Stiffness of right knee, not elsewhere classified  Difficulty in walking, not elsewhere classified  Muscle weakness (generalized)  Rationale for Evaluation and Treatment: Rehabilitation  ONSET DATE: 08/29/23  SUBJECTIVE:   SUBJECTIVE STATEMENT: Patient reports some pain in her knee, states she has been compliant with HEP daily.  From eval: Pt reports it's been hurting really bad. Pt's daughter and granddaughter are currently at home helping her. Pt reports she got 1 week of HHPT. Has been doing the exercises provided to her. Had been using the cane prior to surgery but left it in [place] . Has been using RW in the house currently.   PERTINENT HISTORY: Arthritis, DDD, HTN, spinal cord stimulator. Eval and treat right knee. S/p right TKA 08/29/2023  PAIN:  Are you having pain? Yes: NPRS scale: 5/10 currently, can get up to a 10/10 Pain location: Front of R knee Pain description: Aching and throbbing Aggravating factors: Standing/walking, sometimes sitting Relieving factors: Ice, pain medication  PRECAUTIONS: None  RED  FLAGS: None   WEIGHT BEARING RESTRICTIONS: No  FALLS:  Has patient fallen in last 6 months? No  LIVING ENVIRONMENT: Lives with: granddaughter Lives in: House/apartment Stairs: No; 2 steps to porch Has following equipment at home: Vannie - 2 wheeled and bed side commode  OCCUPATION: Retired - likes to watch TV, go out to eat  PLOF: Independent  PATIENT GOALS: Wants to get off using any a/d  NEXT MD VISIT: 09/15/23  OBJECTIVE:  Note: Objective measures were completed at Evaluation unless otherwise noted.  DIAGNOSTIC FINDINGS: n/a  PATIENT SURVEYS:  Lower Extremity Functional Score: 16 / 80 = 20.0 %  COGNITION: Overall cognitive status: Within functional limits for tasks assessed     SENSATION: WFL no overt N/T  EDEMA:  Unable to accurately measure due to prevena wound vac not properly sealing down  MUSCLE LENGTH: Hamstrings: see knee ROM Thomas test: did  not assess  POSTURE: flexed trunk   PALPATION: TTP along R knee joint line  LOWER EXTREMITY ROM:  Active ROM Right eval Left eval Right 09/22/23 Right 10/06/23  Hip flexion      Hip extension      Hip abduction      Hip adduction      Hip internal rotation      Hip external rotation      Knee flexion 73 sitting 100 sitting 87 sitting 92 sitting  Knee extension -15 sitting -10 sitting -10 LAQ -8 quad set -5 quad set sitting  Ankle dorsiflexion      Ankle plantarflexion      Ankle inversion      Ankle eversion       (Blank rows = not tested)  LOWER EXTREMITY MMT:  MMT Right eval Left eval  Hip flexion 4 4  Hip extension    Hip abduction supine 3+ 4  Hip adduction    Hip internal rotation    Hip external rotation    Knee flexion 4 5  Knee extension 4 5  Ankle dorsiflexion    Ankle plantarflexion    Ankle inversion    Ankle eversion     (Blank rows = not tested)  LOWER EXTREMITY SPECIAL TESTS:  Did not assess  FUNCTIONAL TESTS:  5x STS: 20.74 sec with UE  TUG: 39.38 sec with  RW   GAIT: Distance walked: Into clinic Assistive device utilized: Environmental consultant - 2 wheeled Level of assistance: Modified independence Comments: Antalgic, trunk flexed, decreased R hip ext  TODAY'S TREATMENT:   [facility] Adult PT Treatment:                                                DATE: 10/06/23 Therapeutic Exercise: Nustep L4 x 8 mins moving forward halfway through to promote knee flexion Seated knee flex/ext dangling/swinging 2x30 Seated LAQ eccentrics 2x10 Seated hip flexion 2x30 Seated quad set 3 hold 2x10 Seated heel/toe raise 2 x 30  Seated knee flexion with LLE x10 Therapeutic Activity: Sit<>stand 2x5 to RW - cues to push from table Amb 2x 200' RW SBA. Cues for heel/toe pattern, symmetrical steps, slow, trunk extension, added tennis balls to back of walker Modalities: Cold pack applied to right knee post session, patient in supine with BIL LE on bolster x8 mins (not billed)   [facility] Adult PT Treatment:                                                DATE: 09/27/2023  Therapeutic Exercise: Nustep L6 UEs/LEs x5 min Seated knee flex/ext dangling/swinging 2x30 Seated LAQ eccentrics 2x10 Seated hip flexion 2x10 Seated quad set 2x10 Seated heel/toe raise 2 x 30   Manual Therapy: Scar massage and how to perform self massage at home  Therapeutic Activity:  Sit<>stand 2x5 Amb 2x 200' RW SBA. Cues for heel/toe pattern, symmetrical steps, slow, trunk extension     PATIENT EDUCATION:  Education details: HEP, self care, s/s of blood clots Person educated: Patient Education method: Explanation, Demonstration, and Handouts Education comprehension: verbalized understanding, returned demonstration, and needs further education  HOME EXERCISE PROGRAM: Access Code: [email or web address] URL: [email or web address] Date: 09/14/2023 Prepared by: Gellen April Marie Nonato  Exercises - Supine Heel Slide with Strap  - 1 x daily - 7 x weekly - 2 sets - 10 reps - Hooklying  Bilateral Isometric Clamshell  - 1 x daily - 7 x weekly - 2 sets - 10 reps - Supine Bridge  - 1 x daily - 7 x weekly - 2 sets - 10 reps - Supine Quad Set  - 1 x daily - 7 x weekly - 2 sets - 10 reps  ASSESSMENT:  CLINICAL IMPRESSION: Patient presents to PT reporting continued pain in her knee as well compliance with her HEP. Added tennis balls to back of walker to help her focus on gait pattern and trunk extension instead of picking up the walker with each step. Her ROM has improved to 5-92 flexion today, meeting her STG #2. Continued to focus on quadriceps strengthening and knee ROM. Patient was able to tolerate all prescribed exercises with no adverse effects. Patient continues to benefit from skilled PT services and should be progressed as able to improve functional independence.   From eval: Patient is a 63 y.o. F who was seen today for physical therapy evaluation and treatment s/p R TKA on 08/29/23. Pt has received 1 week of home health and states she has been compliant with her HEP. Assessment indicates limited R knee ROM, decreased strength (especially in quad), and decreased balance affecting pt's safe mobility for home and in the community. TUG and 5x STS scores both indicate a high risk of falls. Pt will benefit from PT to improve on these issues, decrease pain and maximize level of function.   OBJECTIVE IMPAIRMENTS: Abnormal gait, decreased activity tolerance, decreased balance, decreased endurance, decreased mobility, difficulty walking, decreased ROM, decreased strength, hypomobility, increased fascial restrictions, increased muscle spasms, impaired flexibility, improper body mechanics, postural dysfunction, and pain.   ACTIVITY LIMITATIONS: carrying, lifting, bending, sitting, standing, squatting, sleeping, stairs, transfers, bed mobility, bathing, toileting, dressing, hygiene/grooming, and locomotion level  PARTICIPATION LIMITATIONS: meal prep, cleaning, laundry, driving, shopping, and  community activity  PERSONAL FACTORS: Age, Fitness, Past/current experiences, and Time since onset of injury/illness/exacerbation are also affecting patient's functional outcome.   REHAB POTENTIAL: Good  CLINICAL DECISION MAKING: Evolving/moderate complexity  EVALUATION COMPLEXITY: Moderate   GOALS: Goals reviewed with patient? Yes  SHORT TERM GOALS: Target date: 10/12/2023  Pt will be ind with initial HEP Baseline: Goal status: INITIAL  2.  Pt will have improved R knee ROM to >/=5-90 deg Baseline:  Goal status: MET 10/06/23: 5-92  3.  Pt will have improved 5x STS to </=15 sec to demo increasing functional LE strength Baseline:  Goal status: INITIAL    LONG TERM GOALS: Target date: 11/09/2023   Pt will be ind with management and progression of HEP Baseline:  Goal status: INITIAL  2.  Pt will have improved 5x STS to </=13 sec to be considered a low fall risk Baseline:  Goal status: INITIAL  3.  Pt will have improved TUG with LRAD to </=15 sec to demo decreased fall risk Baseline:  Goal status: INITIAL  4.  Pt will be able to amb limited community distances (at least 500') independently Baseline:  Goal status: INITIAL  5.  Pt will have improved LEFS to >/=25/80 to demo MC[facility] for TKA Baseline: 16 Goal status: INITIAL  6.  Pt will have improved knee ROM to >/=5-110 deg to safely negotiate stairs within the community Baseline:  Goal status: INITIAL   PLAN:  PT FREQUENCY: 2x/week  PT DURATION: 8 weeks  PLANNED INTERVENTIONS: 97164- PT Re-evaluation, 97750- Physical Performance Testing, 97110-Therapeutic exercises, 97530- Therapeutic activity, [id number]- Neuromuscular re-education, [phone]- Self Care, [id number]- Manual therapy, [phone]- Gait training, [phone]- Aquatic Therapy, [phone]- Ultrasound, [phone] (1-2 muscles), [phone] (3+ muscles)- Dry Needling, Patient/Family education, Balance training, Stair training, Taping, Joint mobilization, Scar mobilization, Cryotherapy, and  Moist heat   [employer] medicaid -- doesn't cover vaso, estim, ionto  PLAN FOR NEXT SESSION: Assess response to HEP. Work on knee ROM, quad and hip strength. Endurance - please check pulse ox next visit   Corean Pouch PTA  10/06/2023 10:10 AM

## 2023-10-07 ENCOUNTER — Ambulatory Visit

## 2023-10-07 DIAGNOSIS — M25561 Pain in right knee: Secondary | ICD-10-CM

## 2023-10-07 DIAGNOSIS — M25661 Stiffness of right knee, not elsewhere classified: Secondary | ICD-10-CM

## 2023-10-07 DIAGNOSIS — R262 Difficulty in walking, not elsewhere classified: Secondary | ICD-10-CM

## 2023-10-07 NOTE — Therapy (Signed)
 OUTPATIENT PHYSICAL THERAPY TREATMENT NOTE   Patient Name: [name] MRN: [id number] DOB:[birth year], 63 y.o., female Today's Date: 10/07/2023  END OF SESSION:  PT End of Session - 10/07/23 1256     Visit Number 6    Number of Visits 16    Date for PT Re-Evaluation 11/09/23    Authorization Type [employer]    Authorization Time Period Approved 13 visits 09/14/23-12/13/23    Authorization - Visit Number 5    Authorization - Number of Visits 13    PT Start Time 1300    PT Stop Time 1338    PT Time Calculation (min) 38 min    Activity Tolerance Patient tolerated treatment well    Behavior During Therapy WFL for tasks assessed/performed          Past Medical History:  Diagnosis Date   Anemia when she was young   Arthritis    Congestion of nasal sinus    Constipation    takes Colace every other day   DDD (degenerative disc disease), lumbar    Dependent edema    Dry eyes    uses Systane Eye drops daily as needed   GERD (gastroesophageal reflux disease) 12/06/2011   takes Nexium  daily   Headache(784.0)    occasionally-d/t congestion   History of bronchitis 6-7 yrs ago   History of kidney stones    Hyperlipidemia    Hypertension    takes Metoprolol  and Diovan  daily   Hypothyroidism 12/06/2011   takes Synthroid  daily   Joint pain    Multiple allergies    takes Zyrtec daily;uses Nasonex daily as needed   Nocturia    Numbness    weakness-right hand   Onychomycosis    OSA on CPAP    Wears CPAP Nightly   Pancreatitis    Peripheral edema    takes Furosemide  daily   Pre-diabetes    S/P insertion of spinal cord stimulator    Shingles    Urinary frequency    Venous insufficiency of leg    Past Surgical History:  Procedure Laterality Date   ABDOMINAL HYSTERECTOMY     uterine prolapse   BACK SURGERY     L4-5   CARDIAC CATHETERIZATION  2007   CATARACT EXTRACTION, BILATERAL  2023   CHOLECYSTECTOMY     COLONOSCOPY     ESOPHAGOGASTRODUODENOSCOPY   5-67yrs ago   FOOT SURGERY Bilateral    KNEE ARTHROSCOPY Right    LAMINECTOMY THORACIC SPINE W/ PLACEMENT SPINAL CORD STIMULATOR     LAPAROSCOPIC GASTRIC SLEEVE RESECTION N/A 08/23/2016   Procedure: LAPAROSCOPIC GASTRIC SLEEVE RESECTION WITH UPPER ENDO;  Surgeon: Gladis Cough, MD;  Location: [facility];  Service: General;  Laterality: N/A;   POSTERIOR LUMBAR FUSION  03/10/2009   L3-4 and L3-4 fusion, Dr. Catalina Stains   SHOULDER ARTHROSCOPY WITH ROTATOR CUFF REPAIR AND SUBACROMIAL DECOMPRESSION Right 08/23/2013   Procedure: RIGHT SHOULDER ARTHROSCOPY WITH  SUBACROMIAL DECOMPRESSION DISTAL CLAVICLE RESECTION AND  ROTATOR CUFF REPAIR ;  Surgeon: Franky CHRISTELLA Pointer, MD;  Location: [facility];  Service: Orthopedics;  Laterality: Right;   TOTAL KNEE ARTHROPLASTY Right 08/29/2023   Procedure: ARTHROPLASTY, KNEE, TOTAL;  Surgeon: Jerri Kay CHRISTELLA, MD;  Location: [facility];  Service: Orthopedics;  Laterality: Right;   TUBAL LIGATION     Patient Active Problem List   Diagnosis Date Noted   S/P TKR (total knee replacement), right 08/30/2023   Status post total right knee replacement 08/29/2023   Primary osteoarthritis of right  knee 05/03/2023   Primary osteoarthritis of left knee 05/03/2023   Memory change 12/25/2020   Chronic pain syndrome 05/04/2019   Raynaud's disease 04/13/2019   Lumbar radiculopathy 12/18/2018   Spinal stenosis of lumbar region with neurogenic claudication 12/18/2018   Post laminectomy syndrome 12/18/2018   Bilateral primary osteoarthritis of knee 12/27/2016   History of excision of intestinal structure 09/07/2016   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 08/23/2016   Prediabetes 12/03/2015   OSA on CPAP 02/19/2015   Lung field abnormal 11/11/2014   Morbid obesity (HCC) 10/09/2014   Constipation 06/14/2014   Peripheral venous insufficiency 05/25/2013   Pulmonary HTN (HCC) 05/12/2013   Bilateral lower extremity edema 05/12/2013   Morbid obesity with BMI of 50.0-59.9, adult (HCC)     Shoulder joint pain 03/28/2013   Abnormal glucose level 02/20/2013   Alopecia 02/20/2013   Degeneration of lumbar intervertebral disc 02/20/2013   Hyperglycemia due to type 2 diabetes mellitus (HCC) 02/20/2013   Type 2 diabetes mellitus with other specified complication (HCC) 02/20/2013   Back pain, hx of prior back surgery 12/09/2011   Pancreatitis, mild, CT negative 12/08/2011   Fatty liver 12/08/2011   Substernal chest pain, suspected secondary to acute pancreatitis 12/06/2011   Hypertension 12/06/2011   Hypokalemia 12/06/2011   Hypothyroidism 12/06/2011   Anxiety 12/06/2011   BV (bacterial vaginosis)    Hyperlipidemia     PCP: Larnell Hamilton, MD  REFERRING PROVIDER: Jerri Kay HERO, MD  REFERRING DIAG: [phone] (ICD-10-CM) - S/P total knee arthroplasty, right  THERAPY DIAG:  Acute pain of right knee  Stiffness of right knee, not elsewhere classified  Difficulty in walking, not elsewhere classified  Rationale for Evaluation and Treatment: Rehabilitation  ONSET DATE: 08/29/23  SUBJECTIVE:   SUBJECTIVE STATEMENT: Patient reports some pain in her knee, states she has been compliant with HEP daily.  From eval: Pt reports it's been hurting really bad. Pt's daughter and granddaughter are currently at home helping her. Pt reports she got 1 week of HHPT. Has been doing the exercises provided to her. Had been using the cane prior to surgery but left it in [place] . Has been using RW in the house currently.   PERTINENT HISTORY: Arthritis, DDD, HTN, spinal cord stimulator. Eval and treat right knee. S/p right TKA 08/29/2023  PAIN:  Are you having pain? Yes: NPRS scale: 5/10 currently, can get up to a 10/10 Pain location: Front of R knee Pain description: Aching and throbbing Aggravating factors: Standing/walking, sometimes sitting Relieving factors: Ice, pain medication  PRECAUTIONS: None  RED FLAGS: None   WEIGHT BEARING RESTRICTIONS: No  FALLS:  Has patient fallen in  last 6 months? No  LIVING ENVIRONMENT: Lives with: granddaughter Lives in: House/apartment Stairs: No; 2 steps to porch Has following equipment at home: Vannie - 2 wheeled and bed side commode  OCCUPATION: Retired - likes to watch TV, go out to eat  PLOF: Independent  PATIENT GOALS: Wants to get off using any a/d  NEXT MD VISIT: 09/15/23  OBJECTIVE:  Note: Objective measures were completed at Evaluation unless otherwise noted.  DIAGNOSTIC FINDINGS: n/a  PATIENT SURVEYS:  Lower Extremity Functional Score: 16 / 80 = 20.0 %  COGNITION: Overall cognitive status: Within functional limits for tasks assessed     SENSATION: WFL no overt N/T  EDEMA:  Unable to accurately measure due to prevena wound vac not properly sealing down  MUSCLE LENGTH: Hamstrings: see knee ROM Thomas test: did not assess  POSTURE: flexed trunk  PALPATION: TTP along R knee joint line  LOWER EXTREMITY ROM:  Active ROM Right eval Left eval Right 09/22/23 Right 10/06/23  Hip flexion      Hip extension      Hip abduction      Hip adduction      Hip internal rotation      Hip external rotation      Knee flexion 73 sitting 100 sitting 87 sitting 92 sitting  Knee extension -15 sitting -10 sitting -10 LAQ -8 quad set -5 quad set sitting  Ankle dorsiflexion      Ankle plantarflexion      Ankle inversion      Ankle eversion       (Blank rows = not tested)  LOWER EXTREMITY MMT:  MMT Right eval Left eval  Hip flexion 4 4  Hip extension    Hip abduction supine 3+ 4  Hip adduction    Hip internal rotation    Hip external rotation    Knee flexion 4 5  Knee extension 4 5  Ankle dorsiflexion    Ankle plantarflexion    Ankle inversion    Ankle eversion     (Blank rows = not tested)  LOWER EXTREMITY SPECIAL TESTS:  Did not assess  FUNCTIONAL TESTS:  5x STS: 20.74 sec with UE  TUG: 39.38 sec with RW   GAIT: Distance walked: Into clinic Assistive device utilized: Environmental consultant - 2  wheeled Level of assistance: Modified independence Comments: Antalgic, trunk flexed, decreased R hip ext  TODAY'S TREATMENT:   [facility] Adult PT Treatment:                                                DATE: 10/07/2023  Therapeutic Exercise: Nustep L4 x 8 mins moving forward halfway through to promote knee flexion Seated knee flex/ext dangling/swinging 2x30 Seated LAQ eccentrics 2x10 Seated hip flexion 2x30 Seated quad set 3 hold 2x10 Seated heel/toe raise 2 x 30  Seated hamstring curl with RTB 2 x 10  Sit<>stand 2x5 to RW - cues to push from table  Modalities: Cold pack applied to right knee post session, patient in supine with BIL LE on bolster x8 mins (not billed)  [facility] Adult PT Treatment:                                                DATE: 10/06/23 Therapeutic Exercise: Nustep L4 x 8 mins moving forward halfway through to promote knee flexion Seated knee flex/ext dangling/swinging 2x30 Seated LAQ eccentrics 2x10 Seated hip flexion 2x30 Seated quad set 3 hold 2x10 Seated heel/toe raise 2 x 30  Seated knee flexion with LLE x10 Therapeutic Activity: Sit<>stand 2x5 to RW - cues to push from table Amb 2x 200' RW SBA. Cues for heel/toe pattern, symmetrical steps, slow, trunk extension, added tennis balls to back of walker Modalities: Cold pack applied to right knee post session, patient in supine with BIL LE on bolster x8 mins (not billed)   [facility] Adult PT Treatment:  DATE: 09/27/2023  Therapeutic Exercise: Nustep L6 UEs/LEs x5 min Seated knee flex/ext dangling/swinging 2x30 Seated LAQ eccentrics 2x10 Seated hip flexion 2x10 Seated quad set 2x10 Seated heel/toe raise 2 x 30   Manual Therapy: Scar massage and how to perform self massage at home  Therapeutic Activity:  Sit<>stand 2x5 Amb 2x 200' RW SBA. Cues for heel/toe pattern, symmetrical steps, slow, trunk extension     PATIENT EDUCATION:  Education details:  HEP, self care, s/s of blood clots Person educated: Patient Education method: Explanation, Demonstration, and Handouts Education comprehension: verbalized understanding, returned demonstration, and needs further education  HOME EXERCISE PROGRAM: Access Code: [email or web address] URL: [email or web address] Date: 09/14/2023 Prepared by: Gellen April Earnie Starring  Exercises - Supine Heel Slide with Strap  - 1 x daily - 7 x weekly - 2 sets - 10 reps - Hooklying Bilateral Isometric Clamshell  - 1 x daily - 7 x weekly - 2 sets - 10 reps - Supine Bridge  - 1 x daily - 7 x weekly - 2 sets - 10 reps - Supine Quad Set  - 1 x daily - 7 x weekly - 2 sets - 10 reps  ASSESSMENT:  CLINICAL IMPRESSION: Patient reports some pain with resisted HS curl today, but otherwise had good tolerance of exercise today. We will continue to progress as appropriate for post op status.   From eval: Patient is a 63 y.o. F who was seen today for physical therapy evaluation and treatment s/p R TKA on 08/29/23. Pt has received 1 week of home health and states she has been compliant with her HEP. Assessment indicates limited R knee ROM, decreased strength (especially in quad), and decreased balance affecting pt's safe mobility for home and in the community. TUG and 5x STS scores both indicate a high risk of falls. Pt will benefit from PT to improve on these issues, decrease pain and maximize level of function.   OBJECTIVE IMPAIRMENTS: Abnormal gait, decreased activity tolerance, decreased balance, decreased endurance, decreased mobility, difficulty walking, decreased ROM, decreased strength, hypomobility, increased fascial restrictions, increased muscle spasms, impaired flexibility, improper body mechanics, postural dysfunction, and pain.   ACTIVITY LIMITATIONS: carrying, lifting, bending, sitting, standing, squatting, sleeping, stairs, transfers, bed mobility, bathing, toileting, dressing, hygiene/grooming, and locomotion  level  PARTICIPATION LIMITATIONS: meal prep, cleaning, laundry, driving, shopping, and community activity  PERSONAL FACTORS: Age, Fitness, Past/current experiences, and Time since onset of injury/illness/exacerbation are also affecting patient's functional outcome.   REHAB POTENTIAL: Good  CLINICAL DECISION MAKING: Evolving/moderate complexity  EVALUATION COMPLEXITY: Moderate   GOALS: Goals reviewed with patient? Yes  SHORT TERM GOALS: Target date: 10/12/2023  Pt will be ind with initial HEP Baseline: Goal status: INITIAL  2.  Pt will have improved R knee ROM to >/=5-90 deg Baseline:  Goal status: MET 10/06/23: 5-92  3.  Pt will have improved 5x STS to </=15 sec to demo increasing functional LE strength Baseline:  Goal status: INITIAL    LONG TERM GOALS: Target date: 11/09/2023   Pt will be ind with management and progression of HEP Baseline:  Goal status: INITIAL  2.  Pt will have improved 5x STS to </=13 sec to be considered a low fall risk Baseline:  Goal status: INITIAL  3.  Pt will have improved TUG with LRAD to </=15 sec to demo decreased fall risk Baseline:  Goal status: INITIAL  4.  Pt will be able to amb limited community distances (at least 500') independently Baseline:  Goal status: INITIAL  5.  Pt will have improved LEFS to >/=25/80 to demo MCID for TKA Baseline: 16 Goal status: INITIAL  6.  Pt will have improved knee ROM to >/=5-110 deg to safely negotiate stairs within the community Baseline:  Goal status: INITIAL   PLAN:  PT FREQUENCY: 2x/week  PT DURATION: 8 weeks  PLANNED INTERVENTIONS: 97164- PT Re-evaluation, 97750- Physical Performance Testing, 97110-Therapeutic exercises, 97530- Therapeutic activity, 97112- Neuromuscular re-education, 97535- Self Care, [id number]- Manual therapy, [id number]- Gait training, [phone]- Aquatic Therapy, [phone]- Ultrasound, 79439 (1-2 muscles), [id number] (3+ muscles)- Dry Needling, Patient/Family education, Balance  training, Stair training, Taping, Joint mobilization, Scar mobilization, Cryotherapy, and Moist heat   [employer] medicaid -- doesn't cover vaso, estim, ionto  PLAN FOR NEXT SESSION: Assess response to HEP. Work on knee ROM, quad and hip strength. Endurance - please check pulse ox next visit   Marko Molt, PT, DPT  10/07/2023 1:47 PM

## 2023-10-08 ENCOUNTER — Other Ambulatory Visit (HOSPITAL_COMMUNITY): Payer: Self-pay

## 2023-10-10 ENCOUNTER — Other Ambulatory Visit: Payer: Self-pay

## 2023-10-10 ENCOUNTER — Other Ambulatory Visit (HOSPITAL_COMMUNITY): Payer: Self-pay

## 2023-10-10 MED ORDER — AZELASTINE HCL 0.1 % NA SOLN
NASAL | 3 refills | Status: AC
  Filled 2023-10-10: qty 30, 34d supply, fill #0

## 2023-10-11 ENCOUNTER — Other Ambulatory Visit (HOSPITAL_COMMUNITY): Payer: Self-pay

## 2023-10-12 ENCOUNTER — Ambulatory Visit

## 2023-10-12 DIAGNOSIS — R262 Difficulty in walking, not elsewhere classified: Secondary | ICD-10-CM

## 2023-10-12 DIAGNOSIS — M25561 Pain in right knee: Secondary | ICD-10-CM | POA: Diagnosis not present

## 2023-10-12 DIAGNOSIS — M25661 Stiffness of right knee, not elsewhere classified: Secondary | ICD-10-CM

## 2023-10-12 DIAGNOSIS — M6281 Muscle weakness (generalized): Secondary | ICD-10-CM

## 2023-10-12 NOTE — Therapy (Signed)
 OUTPATIENT PHYSICAL THERAPY TREATMENT NOTE   Patient Name: [name] MRN: [id number] DOB:[birth year], 63 y.o., female Today's Date: 10/12/2023  END OF SESSION:  PT End of Session - 10/12/23 1124     Visit Number 7    Number of Visits 16    Date for PT Re-Evaluation 11/09/23    Authorization Type [employer]    Authorization Time Period Approved 13 visits 09/14/23-12/13/23    Authorization - Visit Number 6    Authorization - Number of Visits 13    PT Start Time 1130    PT Stop Time 1215    PT Time Calculation (min) 45 min    Activity Tolerance Patient tolerated treatment well    Behavior During Therapy WFL for tasks assessed/performed          Past Medical History:  Diagnosis Date   Anemia when she was young   Arthritis    Congestion of nasal sinus    Constipation    takes Colace every other day   DDD (degenerative disc disease), lumbar    Dependent edema    Dry eyes    uses Systane Eye drops daily as needed   GERD (gastroesophageal reflux disease) 12/06/2011   takes Nexium  daily   Headache(784.0)    occasionally-d/t congestion   History of bronchitis 6-7 yrs ago   History of kidney stones    Hyperlipidemia    Hypertension    takes Metoprolol  and Diovan  daily   Hypothyroidism 12/06/2011   takes Synthroid  daily   Joint pain    Multiple allergies    takes Zyrtec daily;uses Nasonex daily as needed   Nocturia    Numbness    weakness-right hand   Onychomycosis    OSA on CPAP    Wears CPAP Nightly   Pancreatitis    Peripheral edema    takes Furosemide  daily   Pre-diabetes    S/P insertion of spinal cord stimulator    Shingles    Urinary frequency    Venous insufficiency of leg    Past Surgical History:  Procedure Laterality Date   ABDOMINAL HYSTERECTOMY     uterine prolapse   BACK SURGERY     L4-5   CARDIAC CATHETERIZATION  2007   CATARACT EXTRACTION, BILATERAL  2023   CHOLECYSTECTOMY     COLONOSCOPY     ESOPHAGOGASTRODUODENOSCOPY   5-40yrs ago   FOOT SURGERY Bilateral    KNEE ARTHROSCOPY Right    LAMINECTOMY THORACIC SPINE W/ PLACEMENT SPINAL CORD STIMULATOR     LAPAROSCOPIC GASTRIC SLEEVE RESECTION N/A 08/23/2016   Procedure: LAPAROSCOPIC GASTRIC SLEEVE RESECTION WITH UPPER ENDO;  Surgeon: Gladis Cough, MD;  Location: [facility];  Service: General;  Laterality: N/A;   POSTERIOR LUMBAR FUSION  03/10/2009   L3-4 and L3-4 fusion, Dr. Catalina Stains   SHOULDER ARTHROSCOPY WITH ROTATOR CUFF REPAIR AND SUBACROMIAL DECOMPRESSION Right 08/23/2013   Procedure: RIGHT SHOULDER ARTHROSCOPY WITH  SUBACROMIAL DECOMPRESSION DISTAL CLAVICLE RESECTION AND  ROTATOR CUFF REPAIR ;  Surgeon: Franky CHRISTELLA Pointer, MD;  Location: [facility];  Service: Orthopedics;  Laterality: Right;   TOTAL KNEE ARTHROPLASTY Right 08/29/2023   Procedure: ARTHROPLASTY, KNEE, TOTAL;  Surgeon: Jerri Kay CHRISTELLA, MD;  Location: [facility];  Service: Orthopedics;  Laterality: Right;   TUBAL LIGATION     Patient Active Problem List   Diagnosis Date Noted   S/P TKR (total knee replacement), right 08/30/2023   Status post total right knee replacement 08/29/2023   Primary osteoarthritis of right  knee 05/03/2023   Primary osteoarthritis of left knee 05/03/2023   Memory change 12/25/2020   Chronic pain syndrome 05/04/2019   Raynaud's disease 04/13/2019   Lumbar radiculopathy 12/18/2018   Spinal stenosis of lumbar region with neurogenic claudication 12/18/2018   Post laminectomy syndrome 12/18/2018   Bilateral primary osteoarthritis of knee 12/27/2016   History of excision of intestinal structure 09/07/2016   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 08/23/2016   Prediabetes 12/03/2015   OSA on CPAP 02/19/2015   Lung field abnormal 11/11/2014   Morbid obesity (HCC) 10/09/2014   Constipation 06/14/2014   Peripheral venous insufficiency 05/25/2013   Pulmonary HTN (HCC) 05/12/2013   Bilateral lower extremity edema 05/12/2013   Morbid obesity with BMI of 50.0-59.9, adult (HCC)     Shoulder joint pain 03/28/2013   Abnormal glucose level 02/20/2013   Alopecia 02/20/2013   Degeneration of lumbar intervertebral disc 02/20/2013   Hyperglycemia due to type 2 diabetes mellitus (HCC) 02/20/2013   Type 2 diabetes mellitus with other specified complication (HCC) 02/20/2013   Back pain, hx of prior back surgery 12/09/2011   Pancreatitis, mild, CT negative 12/08/2011   Fatty liver 12/08/2011   Substernal chest pain, suspected secondary to acute pancreatitis 12/06/2011   Hypertension 12/06/2011   Hypokalemia 12/06/2011   Hypothyroidism 12/06/2011   Anxiety 12/06/2011   BV (bacterial vaginosis)    Hyperlipidemia     PCP: Larnell Hamilton, MD  REFERRING PROVIDER: Jerri Kay HERO, MD  REFERRING DIAG: [phone] (ICD-10-CM) - S/P total knee arthroplasty, right  THERAPY DIAG:  Acute pain of right knee  Stiffness of right knee, not elsewhere classified  Difficulty in walking, not elsewhere classified  Muscle weakness (generalized)  Rationale for Evaluation and Treatment: Rehabilitation  ONSET DATE: 08/29/23  SUBJECTIVE:   SUBJECTIVE STATEMENT: Patient reports that her knee is feeling very stiff today, felt ok after last session.   From eval: Pt reports it's been hurting really bad. Pt's daughter and granddaughter are currently at home helping her. Pt reports she got 1 week of HHPT. Has been doing the exercises provided to her. Had been using the cane prior to surgery but left it in [place] . Has been using RW in the house currently.   PERTINENT HISTORY: Arthritis, DDD, HTN, spinal cord stimulator. Eval and treat right knee. S/p right TKA 08/29/2023  PAIN:  Are you having pain? Yes: NPRS scale: 5/10 currently, can get up to a 10/10 Pain location: Front of R knee Pain description: Aching and throbbing Aggravating factors: Standing/walking, sometimes sitting Relieving factors: Ice, pain medication  PRECAUTIONS: None  RED FLAGS: None   WEIGHT BEARING RESTRICTIONS:  No  FALLS:  Has patient fallen in last 6 months? No  LIVING ENVIRONMENT: Lives with: granddaughter Lives in: House/apartment Stairs: No; 2 steps to porch Has following equipment at home: Vannie - 2 wheeled and bed side commode  OCCUPATION: Retired - likes to watch TV, go out to eat  PLOF: Independent  PATIENT GOALS: Wants to get off using any a/d  NEXT MD VISIT: 09/15/23  OBJECTIVE:  Note: Objective measures were completed at Evaluation unless otherwise noted.  DIAGNOSTIC FINDINGS: n/a  PATIENT SURVEYS:  Lower Extremity Functional Score: 16 / 80 = 20.0 %  COGNITION: Overall cognitive status: Within functional limits for tasks assessed     SENSATION: WFL no overt N/T  EDEMA:  Unable to accurately measure due to prevena wound vac not properly sealing down  MUSCLE LENGTH: Hamstrings: see knee ROM Thomas test: did not assess  POSTURE: flexed trunk   PALPATION: TTP along R knee joint line  LOWER EXTREMITY ROM:  Active ROM Right eval Left eval Right 09/22/23 Right 10/06/23 Right 10/12/23  Hip flexion       Hip extension       Hip abduction       Hip adduction       Hip internal rotation       Hip external rotation       Knee flexion 73 sitting 100 sitting 87 sitting 92 sitting 82 sitting EOM with OP  Knee extension -15 sitting -10 sitting -10 LAQ -8 quad set -5 quad set sitting   Ankle dorsiflexion       Ankle plantarflexion       Ankle inversion       Ankle eversion        (Blank rows = not tested)  LOWER EXTREMITY MMT:  MMT Right eval Left eval  Hip flexion 4 4  Hip extension    Hip abduction supine 3+ 4  Hip adduction    Hip internal rotation    Hip external rotation    Knee flexion 4 5  Knee extension 4 5  Ankle dorsiflexion    Ankle plantarflexion    Ankle inversion    Ankle eversion     (Blank rows = not tested)  LOWER EXTREMITY SPECIAL TESTS:  Did not assess  FUNCTIONAL TESTS:  5x STS: 20.74 sec with UE  TUG: 39.38 sec with  RW   GAIT: Distance walked: Into clinic Assistive device utilized: Environmental consultant - 2 wheeled Level of assistance: Modified independence Comments: Antalgic, trunk flexed, decreased R hip ext  TODAY'S TREATMENT:  [facility] Adult PT Treatment:                                                DATE: 10/12/23 Therapeutic Exercise: Nustep L4 x 8 mins moving forward halfway through to promote knee flexion Seated knee flex/ext dangling/swinging 3x30 Seated LAQ eccentrics 2x10 Seated hip flexion 2x30 Seated quad set 3 hold 2x10 Seated heel/toe raise x20 Seated hamstring curl with RTB 2 x 10  Standing heel/toe raise 2x10 Sit<>stand 2x5 Tailgate stretch with distraction into flexion EOM, therapist resistance Therapeutic Activity: Gait with SPC, moving cane to LUE, sequencing and form Modalities: Cold pack applied to right knee post session, patient in supine with BIL LE on bolster x8 mins (not billed)  [facility] Adult PT Treatment:                                                DATE: 10/07/2023  Therapeutic Exercise: Nustep L4 x 8 mins moving forward halfway through to promote knee flexion Seated knee flex/ext dangling/swinging 2x30 Seated LAQ eccentrics 2x10 Seated hip flexion 2x30 Seated quad set 3 hold 2x10 Seated heel/toe raise 2 x 30  Seated hamstring curl with RTB 2 x 10  Sit<>stand 2x5 to RW - cues to push from table  Modalities: Cold pack applied to right knee post session, patient in supine with BIL LE on bolster x8 mins (not billed)  [facility] Adult PT Treatment:  DATE: 10/06/23 Therapeutic Exercise: Nustep L4 x 8 mins moving forward halfway through to promote knee flexion Seated knee flex/ext dangling/swinging 2x30 Seated LAQ eccentrics 2x10 Seated hip flexion 2x30 Seated quad set 3 hold 2x10 Seated heel/toe raise 2 x 30  Seated knee flexion with LLE x10 Therapeutic Activity: Sit<>stand 2x5 to RW - cues to push from table Amb 2x 200'  RW SBA. Cues for heel/toe pattern, symmetrical steps, slow, trunk extension, added tennis balls to back of walker Modalities: Cold pack applied to right knee post session, patient in supine with BIL LE on bolster x8 mins (not billed)     PATIENT EDUCATION:  Education details: HEP, self care, s/s of blood clots Person educated: Patient Education method: Explanation, Demonstration, and Handouts Education comprehension: verbalized understanding, returned demonstration, and needs further education  HOME EXERCISE PROGRAM: Access Code: [email or web address] URL: [email or web address] Date: 09/14/2023 Prepared by: Gellen April Earnie Starring  Exercises - Supine Heel Slide with Strap  - 1 x daily - 7 x weekly - 2 sets - 10 reps - Hooklying Bilateral Isometric Clamshell  - 1 x daily - 7 x weekly - 2 sets - 10 reps - Supine Bridge  - 1 x daily - 7 x weekly - 2 sets - 10 reps - Supine Quad Set  - 1 x daily - 7 x weekly - 2 sets - 10 reps  ASSESSMENT:  CLINICAL IMPRESSION: Patient presents to PT ambulating with [facility] today, stating she uses RW for longer distances, reporting increased stiffness in her knee. She ambulates with cane in RUE with increased trunk lean to Rt, corrected cane to LUE and she is able to ambulate without lean and states that it feels more comfortable as well. Measured flexion in sitting with overpressure and was only able to achieve 82 today, which is regressed from previous session. Her flexion ROM is behind schedule at this time. Patient continues to benefit from skilled PT services and should be progressed as able to improve functional independence.   From eval: Patient is a 63 y.o. F who was seen today for physical therapy evaluation and treatment s/p R TKA on 08/29/23. Pt has received 1 week of home health and states she has been compliant with her HEP. Assessment indicates limited R knee ROM, decreased strength (especially in quad), and decreased balance affecting pt's safe  mobility for home and in the community. TUG and 5x STS scores both indicate a high risk of falls. Pt will benefit from PT to improve on these issues, decrease pain and maximize level of function.   OBJECTIVE IMPAIRMENTS: Abnormal gait, decreased activity tolerance, decreased balance, decreased endurance, decreased mobility, difficulty walking, decreased ROM, decreased strength, hypomobility, increased fascial restrictions, increased muscle spasms, impaired flexibility, improper body mechanics, postural dysfunction, and pain.   ACTIVITY LIMITATIONS: carrying, lifting, bending, sitting, standing, squatting, sleeping, stairs, transfers, bed mobility, bathing, toileting, dressing, hygiene/grooming, and locomotion level  PARTICIPATION LIMITATIONS: meal prep, cleaning, laundry, driving, shopping, and community activity  PERSONAL FACTORS: Age, Fitness, Past/current experiences, and Time since onset of injury/illness/exacerbation are also affecting patient's functional outcome.   REHAB POTENTIAL: Good  CLINICAL DECISION MAKING: Evolving/moderate complexity  EVALUATION COMPLEXITY: Moderate   GOALS: Goals reviewed with patient? Yes  SHORT TERM GOALS: Target date: 10/12/2023  Pt will be ind with initial HEP Baseline: Goal status: INITIAL  2.  Pt will have improved R knee ROM to >/=5-90 deg Baseline:  Goal status: MET 10/06/23: 5-92  3.  Pt will have improved 5x STS to </=  15 sec to demo increasing functional LE strength Baseline:  Goal status: INITIAL    LONG TERM GOALS: Target date: 11/09/2023   Pt will be ind with management and progression of HEP Baseline:  Goal status: INITIAL  2.  Pt will have improved 5x STS to </=13 sec to be considered a low fall risk Baseline:  Goal status: INITIAL  3.  Pt will have improved TUG with LRAD to </=15 sec to demo decreased fall risk Baseline:  Goal status: INITIAL  4.  Pt will be able to amb limited community distances (at least 500')  independently Baseline:  Goal status: INITIAL  5.  Pt will have improved LEFS to >/=25/80 to demo MCID for TKA Baseline: 16 Goal status: INITIAL  6.  Pt will have improved knee ROM to >/=5-110 deg to safely negotiate stairs within the community Baseline:  Goal status: INITIAL   PLAN:  PT FREQUENCY: 2x/week  PT DURATION: 8 weeks  PLANNED INTERVENTIONS: [id number]- PT Re-evaluation, 97750- Physical Performance Testing, 97110-Therapeutic exercises, 97530- Therapeutic activity, 97112- Neuromuscular re-education, 97535- Self Care, [id number]- Manual therapy, [id number]- Gait training, [phone]- Aquatic Therapy, [phone]- Ultrasound, 79439 (1-2 muscles), [id number] (3+ muscles)- Dry Needling, Patient/Family education, Balance training, Stair training, Taping, Joint mobilization, Scar mobilization, Cryotherapy, and Moist heat   [employer] medicaid -- doesn't cover vaso, estim, ionto  PLAN FOR NEXT SESSION: Assess response to HEP. Work on knee ROM, quad and hip strength. Endurance - please check pulse ox next visit   Corean Pouch PTA  10/12/2023 12:21 PM

## 2023-10-13 ENCOUNTER — Encounter: Admitting: Physician Assistant

## 2023-10-14 ENCOUNTER — Ambulatory Visit

## 2023-10-14 DIAGNOSIS — M25661 Stiffness of right knee, not elsewhere classified: Secondary | ICD-10-CM

## 2023-10-14 DIAGNOSIS — M25561 Pain in right knee: Secondary | ICD-10-CM | POA: Diagnosis not present

## 2023-10-14 DIAGNOSIS — R262 Difficulty in walking, not elsewhere classified: Secondary | ICD-10-CM

## 2023-10-14 DIAGNOSIS — M6281 Muscle weakness (generalized): Secondary | ICD-10-CM

## 2023-10-14 NOTE — Therapy (Signed)
 OUTPATIENT PHYSICAL THERAPY TREATMENT NOTE   Patient Name: [name] MRN: [id number] DOB:[birth year], 63 y.o., female Today's Date: 10/14/2023  END OF SESSION:  PT End of Session - 10/14/23 1126     Visit Number 8    Number of Visits 16    Date for Recertification  11/09/23    Authorization Type [employer]    Authorization Time Period Approved 13 visits 09/14/23-12/13/23    Authorization - Visit Number 7    Authorization - Number of Visits 13    PT Start Time 1130    PT Stop Time 1216    PT Time Calculation (min) 46 min    Activity Tolerance Patient tolerated treatment well    Behavior During Therapy WFL for tasks assessed/performed          Past Medical History:  Diagnosis Date   Anemia when she was young   Arthritis    Congestion of nasal sinus    Constipation    takes Colace every other day   DDD (degenerative disc disease), lumbar    Dependent edema    Dry eyes    uses Systane Eye drops daily as needed   GERD (gastroesophageal reflux disease) 12/06/2011   takes Nexium  daily   Headache(784.0)    occasionally-d/t congestion   History of bronchitis 6-7 yrs ago   History of kidney stones    Hyperlipidemia    Hypertension    takes Metoprolol  and Diovan  daily   Hypothyroidism 12/06/2011   takes Synthroid  daily   Joint pain    Multiple allergies    takes Zyrtec daily;uses Nasonex daily as needed   Nocturia    Numbness    weakness-right hand   Onychomycosis    OSA on CPAP    Wears CPAP Nightly   Pancreatitis    Peripheral edema    takes Furosemide  daily   Pre-diabetes    S/P insertion of spinal cord stimulator    Shingles    Urinary frequency    Venous insufficiency of leg    Past Surgical History:  Procedure Laterality Date   ABDOMINAL HYSTERECTOMY     uterine prolapse   BACK SURGERY     L4-5   CARDIAC CATHETERIZATION  2007   CATARACT EXTRACTION, BILATERAL  2023   CHOLECYSTECTOMY     COLONOSCOPY     ESOPHAGOGASTRODUODENOSCOPY   5-1yrs ago   FOOT SURGERY Bilateral    KNEE ARTHROSCOPY Right    LAMINECTOMY THORACIC SPINE W/ PLACEMENT SPINAL CORD STIMULATOR     LAPAROSCOPIC GASTRIC SLEEVE RESECTION N/A 08/23/2016   Procedure: LAPAROSCOPIC GASTRIC SLEEVE RESECTION WITH UPPER ENDO;  Surgeon: Gladis Cough, MD;  Location: [facility];  Service: General;  Laterality: N/A;   POSTERIOR LUMBAR FUSION  03/10/2009   L3-4 and L3-4 fusion, Dr. Catalina Stains   SHOULDER ARTHROSCOPY WITH ROTATOR CUFF REPAIR AND SUBACROMIAL DECOMPRESSION Right 08/23/2013   Procedure: RIGHT SHOULDER ARTHROSCOPY WITH  SUBACROMIAL DECOMPRESSION DISTAL CLAVICLE RESECTION AND  ROTATOR CUFF REPAIR ;  Surgeon: Franky CHRISTELLA Pointer, MD;  Location: [facility];  Service: Orthopedics;  Laterality: Right;   TOTAL KNEE ARTHROPLASTY Right 08/29/2023   Procedure: ARTHROPLASTY, KNEE, TOTAL;  Surgeon: Jerri Kay CHRISTELLA, MD;  Location: [facility];  Service: Orthopedics;  Laterality: Right;   TUBAL LIGATION     Patient Active Problem List   Diagnosis Date Noted   S/P TKR (total knee replacement), right 08/30/2023   Status post total right knee replacement 08/29/2023   Primary osteoarthritis of right  knee 05/03/2023   Primary osteoarthritis of left knee 05/03/2023   Memory change 12/25/2020   Chronic pain syndrome 05/04/2019   Raynaud's disease 04/13/2019   Lumbar radiculopathy 12/18/2018   Spinal stenosis of lumbar region with neurogenic claudication 12/18/2018   Post laminectomy syndrome 12/18/2018   Bilateral primary osteoarthritis of knee 12/27/2016   History of excision of intestinal structure 09/07/2016   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 08/23/2016   Prediabetes 12/03/2015   OSA on CPAP 02/19/2015   Lung field abnormal 11/11/2014   Morbid obesity (HCC) 10/09/2014   Constipation 06/14/2014   Peripheral venous insufficiency 05/25/2013   Pulmonary HTN (HCC) 05/12/2013   Bilateral lower extremity edema 05/12/2013   Morbid obesity with BMI of 50.0-59.9, adult (HCC)     Shoulder joint pain 03/28/2013   Abnormal glucose level 02/20/2013   Alopecia 02/20/2013   Degeneration of lumbar intervertebral disc 02/20/2013   Hyperglycemia due to type 2 diabetes mellitus (HCC) 02/20/2013   Type 2 diabetes mellitus with other specified complication (HCC) 02/20/2013   Back pain, hx of prior back surgery 12/09/2011   Pancreatitis, mild, CT negative 12/08/2011   Fatty liver 12/08/2011   Substernal chest pain, suspected secondary to acute pancreatitis 12/06/2011   Hypertension 12/06/2011   Hypokalemia 12/06/2011   Hypothyroidism 12/06/2011   Anxiety 12/06/2011   BV (bacterial vaginosis)    Hyperlipidemia     PCP: Larnell Hamilton, MD  REFERRING PROVIDER: Jerri Kay HERO, MD  REFERRING DIAG: [phone] (ICD-10-CM) - S/P total knee arthroplasty, right  THERAPY DIAG:  Acute pain of right knee  Stiffness of right knee, not elsewhere classified  Difficulty in walking, not elsewhere classified  Muscle weakness (generalized)  Rationale for Evaluation and Treatment: Rehabilitation  ONSET DATE: 08/29/23  SUBJECTIVE:   SUBJECTIVE STATEMENT: Patient reports increased stiffness and pain today, stating that the pushing done last session really aggravated the knee.   From eval: Pt reports it's been hurting really bad. Pt's daughter and granddaughter are currently at home helping her. Pt reports she got 1 week of HHPT. Has been doing the exercises provided to her. Had been using the cane prior to surgery but left it in [place] . Has been using RW in the house currently.   PERTINENT HISTORY: Arthritis, DDD, HTN, spinal cord stimulator. Eval and treat right knee. S/p right TKA 08/29/2023  PAIN:  Are you having pain? Yes: NPRS scale: 5/10 currently, can get up to a 10/10 Pain location: Front of R knee Pain description: Aching and throbbing Aggravating factors: Standing/walking, sometimes sitting Relieving factors: Ice, pain medication  PRECAUTIONS: None  RED  FLAGS: None   WEIGHT BEARING RESTRICTIONS: No  FALLS:  Has patient fallen in last 6 months? No  LIVING ENVIRONMENT: Lives with: granddaughter Lives in: House/apartment Stairs: No; 2 steps to porch Has following equipment at home: Vannie - 2 wheeled and bed side commode  OCCUPATION: Retired - likes to watch TV, go out to eat  PLOF: Independent  PATIENT GOALS: Wants to get off using any a/d  NEXT MD VISIT: 09/15/23  OBJECTIVE:  Note: Objective measures were completed at Evaluation unless otherwise noted.  DIAGNOSTIC FINDINGS: n/a  PATIENT SURVEYS:  Lower Extremity Functional Score: 16 / 80 = 20.0 %  COGNITION: Overall cognitive status: Within functional limits for tasks assessed     SENSATION: WFL no overt N/T  EDEMA:  Unable to accurately measure due to prevena wound vac not properly sealing down  MUSCLE LENGTH: Hamstrings: see knee ROM Thomas test:  did not assess  POSTURE: flexed trunk   PALPATION: TTP along R knee joint line  LOWER EXTREMITY ROM:  Active ROM Right eval Left eval Right 09/22/23 Right 10/06/23 Right 10/12/23  Hip flexion       Hip extension       Hip abduction       Hip adduction       Hip internal rotation       Hip external rotation       Knee flexion 73 sitting 100 sitting 87 sitting 92 sitting 82 sitting EOM with OP  Knee extension -15 sitting -10 sitting -10 LAQ -8 quad set -5 quad set sitting   Ankle dorsiflexion       Ankle plantarflexion       Ankle inversion       Ankle eversion        (Blank rows = not tested)  LOWER EXTREMITY MMT:  MMT Right eval Left eval  Hip flexion 4 4  Hip extension    Hip abduction supine 3+ 4  Hip adduction    Hip internal rotation    Hip external rotation    Knee flexion 4 5  Knee extension 4 5  Ankle dorsiflexion    Ankle plantarflexion    Ankle inversion    Ankle eversion     (Blank rows = not tested)  LOWER EXTREMITY SPECIAL TESTS:  Did not assess  FUNCTIONAL TESTS:  5x  STS: 20.74 sec with UE  TUG: 39.38 sec with RW   GAIT: Distance walked: Into clinic Assistive device utilized: Environmental consultant - 2 wheeled Level of assistance: Modified independence Comments: Antalgic, trunk flexed, decreased R hip ext  TODAY'S TREATMENT:  [facility] Adult PT Treatment:                                                DATE: 10/14/23 Therapeutic Exercise: Nustep L4 x 8 mins moving forward halfway through to promote knee flexion Seated heel slide foot on towel LLE assist 2x10 Seated knee flex/ext dangling/swinging 3x30 Seated LAQ eccentrics 2x10 Seated hip flexion 2x30 Seated quad set 3 hold 2x10 Seated heel/toe raise x30 Seated hamstring curl with RTB 2 x 10  Standing heel/toe raise 2x10 Sit<>stand 3x5 Modalities: Cold pack applied to right knee post session, patient in supine with BIL LE on bolster x8 mins (not billed)  [facility] Adult PT Treatment:                                                DATE: 10/12/23 Therapeutic Exercise: Nustep L4 x 8 mins moving forward halfway through to promote knee flexion Seated knee flex/ext dangling/swinging 3x30 Seated LAQ eccentrics 2x10 Seated hip flexion 2x30 Seated quad set 3 hold 2x10 Seated heel/toe raise x20 Seated hamstring curl with RTB 2 x 10  Standing heel/toe raise 2x10 Sit<>stand 2x5 Tailgate stretch with distraction into flexion EOM, therapist resistance Therapeutic Activity: Gait with SPC, moving cane to LUE, sequencing and form Modalities: Cold pack applied to right knee post session, patient in supine with BIL LE on bolster x8 mins (not billed)  [facility] Adult PT Treatment:  DATE: 10/07/2023  Therapeutic Exercise: Nustep L4 x 8 mins moving forward halfway through to promote knee flexion Seated knee flex/ext dangling/swinging 2x30 Seated LAQ eccentrics 2x10 Seated hip flexion 2x30 Seated quad set 3 hold 2x10 Seated heel/toe raise 2 x 30  Seated hamstring curl with RTB 2  x 10  Sit<>stand 2x5 to RW - cues to push from table  Modalities: Cold pack applied to right knee post session, patient in supine with BIL LE on bolster x8 mins (not billed)    PATIENT EDUCATION:  Education details: HEP, self care, s/s of blood clots Person educated: Patient Education method: Explanation, Demonstration, and Handouts Education comprehension: verbalized understanding, returned demonstration, and needs further education  HOME EXERCISE PROGRAM: Access Code: [email or web address] URL: [email or web address] Date: 09/14/2023 Prepared by: Gellen April Earnie Starring  Exercises - Supine Heel Slide with Strap  - 1 x daily - 7 x weekly - 2 sets - 10 reps - Hooklying Bilateral Isometric Clamshell  - 1 x daily - 7 x weekly - 2 sets - 10 reps - Supine Bridge  - 1 x daily - 7 x weekly - 2 sets - 10 reps - Supine Quad Set  - 1 x daily - 7 x weekly - 2 sets - 10 reps  ASSESSMENT:  CLINICAL IMPRESSION: Patient presents to PT reporting continued high levels of pain from previous session. She states that the pushing on it exacerbated her pain. Session today continued to focus on LE strengthening and knee ROM. She notes improvements in the pain with exercises today Patient was able to tolerate all prescribed exercises with no adverse effects. Patient continues to benefit from skilled PT services and should be progressed as able to improve functional independence.   From eval: Patient is a 63 y.o. F who was seen today for physical therapy evaluation and treatment s/p R TKA on 08/29/23. Pt has received 1 week of home health and states she has been compliant with her HEP. Assessment indicates limited R knee ROM, decreased strength (especially in quad), and decreased balance affecting pt's safe mobility for home and in the community. TUG and 5x STS scores both indicate a high risk of falls. Pt will benefit from PT to improve on these issues, decrease pain and maximize level of function.    OBJECTIVE IMPAIRMENTS: Abnormal gait, decreased activity tolerance, decreased balance, decreased endurance, decreased mobility, difficulty walking, decreased ROM, decreased strength, hypomobility, increased fascial restrictions, increased muscle spasms, impaired flexibility, improper body mechanics, postural dysfunction, and pain.   ACTIVITY LIMITATIONS: carrying, lifting, bending, sitting, standing, squatting, sleeping, stairs, transfers, bed mobility, bathing, toileting, dressing, hygiene/grooming, and locomotion level  PARTICIPATION LIMITATIONS: meal prep, cleaning, laundry, driving, shopping, and community activity  PERSONAL FACTORS: Age, Fitness, Past/current experiences, and Time since onset of injury/illness/exacerbation are also affecting patient's functional outcome.   REHAB POTENTIAL: Good  CLINICAL DECISION MAKING: Evolving/moderate complexity  EVALUATION COMPLEXITY: Moderate   GOALS: Goals reviewed with patient? Yes  SHORT TERM GOALS: Target date: 10/12/2023  Pt will be ind with initial HEP Baseline: Goal status: INITIAL  2.  Pt will have improved R knee ROM to >/=5-90 deg Baseline:  Goal status: MET 10/06/23: 5-92  3.  Pt will have improved 5x STS to </=15 sec to demo increasing functional LE strength Baseline:  Goal status: INITIAL    LONG TERM GOALS: Target date: 11/09/2023   Pt will be ind with management and progression of HEP Baseline:  Goal status: INITIAL  2.  Pt will have improved 5x  STS to </=13 sec to be considered a low fall risk Baseline:  Goal status: INITIAL  3.  Pt will have improved TUG with LRAD to </=15 sec to demo decreased fall risk Baseline:  Goal status: INITIAL  4.  Pt will be able to amb limited community distances (at least 500') independently Baseline:  Goal status: INITIAL  5.  Pt will have improved LEFS to >/=25/80 to demo [facility] for TKA Baseline: 16 Goal status: INITIAL  6.  Pt will have improved knee ROM to >/=5-110  deg to safely negotiate stairs within the community Baseline:  Goal status: INITIAL   PLAN:  PT FREQUENCY: 2x/week  PT DURATION: 8 weeks  PLANNED INTERVENTIONS: [id number]- PT Re-evaluation, 97750- Physical Performance Testing, 97110-Therapeutic exercises, 97530- Therapeutic activity, 97112- Neuromuscular re-education, 97535- Self Care, [id number]- Manual therapy, [id number]- Gait training, [phone]- Aquatic Therapy, [phone]- Ultrasound, 79439 (1-2 muscles), 20561 (3+ muscles)- Dry Needling, Patient/Family education, Balance training, Stair training, Taping, Joint mobilization, Scar mobilization, Cryotherapy, and Moist heat   [employer] medicaid -- doesn't cover vaso, estim, ionto  PLAN FOR NEXT SESSION: Assess response to HEP. Work on knee ROM, quad and hip strength. Endurance - please check pulse ox next visit   Corean Pouch PTA  10/14/2023 12:21 PM

## 2023-10-18 ENCOUNTER — Ambulatory Visit

## 2023-10-18 DIAGNOSIS — M6281 Muscle weakness (generalized): Secondary | ICD-10-CM

## 2023-10-18 DIAGNOSIS — M25561 Pain in right knee: Secondary | ICD-10-CM | POA: Diagnosis not present

## 2023-10-18 DIAGNOSIS — M25661 Stiffness of right knee, not elsewhere classified: Secondary | ICD-10-CM

## 2023-10-18 DIAGNOSIS — R262 Difficulty in walking, not elsewhere classified: Secondary | ICD-10-CM

## 2023-10-18 NOTE — Therapy (Signed)
 OUTPATIENT PHYSICAL THERAPY TREATMENT NOTE   Patient Name: [name] MRN: [id number] DOB:Oct 08, 1960, 63 y.o., female Today's Date: 10/18/2023  END OF SESSION:  PT End of Session - 10/18/23 1141     Visit Number 9    Number of Visits 16    Date for Recertification  11/09/23    Authorization Type [employer]    Authorization Time Period Approved 13 visits 09/14/23-12/13/23    Authorization - Visit Number 8    Authorization - Number of Visits 13    PT Start Time 1135    PT Stop Time 1213    PT Time Calculation (min) 38 min    Activity Tolerance Patient tolerated treatment well    Behavior During Therapy WFL for tasks assessed/performed           Past Medical History:  Diagnosis Date   Anemia when she was young   Arthritis    Congestion of nasal sinus    Constipation    takes Colace every other day   DDD (degenerative disc disease), lumbar    Dependent edema    Dry eyes    uses Systane Eye drops daily as needed   GERD (gastroesophageal reflux disease) 12/06/2011   takes Nexium  daily   Headache(784.0)    occasionally-d/t congestion   History of bronchitis 6-7 yrs ago   History of kidney stones    Hyperlipidemia    Hypertension    takes Metoprolol  and Diovan  daily   Hypothyroidism 12/06/2011   takes Synthroid  daily   Joint pain    Multiple allergies    takes Zyrtec daily;uses Nasonex daily as needed   Nocturia    Numbness    weakness-right hand   Onychomycosis    OSA on CPAP    Wears CPAP Nightly   Pancreatitis    Peripheral edema    takes Furosemide  daily   Pre-diabetes    S/P insertion of spinal cord stimulator    Shingles    Urinary frequency    Venous insufficiency of leg    Past Surgical History:  Procedure Laterality Date   ABDOMINAL HYSTERECTOMY     uterine prolapse   BACK SURGERY     L4-5   CARDIAC CATHETERIZATION  2007   CATARACT EXTRACTION, BILATERAL  2023   CHOLECYSTECTOMY     COLONOSCOPY      ESOPHAGOGASTRODUODENOSCOPY  5-14yrs ago   FOOT SURGERY Bilateral    KNEE ARTHROSCOPY Right    LAMINECTOMY THORACIC SPINE W/ PLACEMENT SPINAL CORD STIMULATOR     LAPAROSCOPIC GASTRIC SLEEVE RESECTION N/A 08/23/2016   Procedure: LAPAROSCOPIC GASTRIC SLEEVE RESECTION WITH UPPER ENDO;  Surgeon: Gladis Cough, MD;  Location: [facility];  Service: General;  Laterality: N/A;   POSTERIOR LUMBAR FUSION  03/10/2009   L3-4 and L3-4 fusion, Dr. Catalina Stains   SHOULDER ARTHROSCOPY WITH ROTATOR CUFF REPAIR AND SUBACROMIAL DECOMPRESSION Right 08/23/2013   Procedure: RIGHT SHOULDER ARTHROSCOPY WITH  SUBACROMIAL DECOMPRESSION DISTAL CLAVICLE RESECTION AND  ROTATOR CUFF REPAIR ;  Surgeon: Franky CHRISTELLA Pointer, MD;  Location: [facility];  Service: Orthopedics;  Laterality: Right;   TOTAL KNEE ARTHROPLASTY Right 08/29/2023   Procedure: ARTHROPLASTY, KNEE, TOTAL;  Surgeon: Jerri Kay CHRISTELLA, MD;  Location: [facility];  Service: Orthopedics;  Laterality: Right;   TUBAL LIGATION     Patient Active Problem List   Diagnosis Date Noted   S/P TKR (total knee replacement), right 08/30/2023   Status post total right knee replacement 08/29/2023   Primary osteoarthritis of  right knee 05/03/2023   Primary osteoarthritis of left knee 05/03/2023   Memory change 12/25/2020   Chronic pain syndrome 05/04/2019   Raynaud's disease 04/13/2019   Lumbar radiculopathy 12/18/2018   Spinal stenosis of lumbar region with neurogenic claudication 12/18/2018   Post laminectomy syndrome 12/18/2018   Bilateral primary osteoarthritis of knee 12/27/2016   History of excision of intestinal structure 09/07/2016   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 08/23/2016   Prediabetes 12/03/2015   OSA on CPAP 02/19/2015   Lung field abnormal 11/11/2014   Morbid obesity (HCC) 10/09/2014   Constipation 06/14/2014   Peripheral venous insufficiency 05/25/2013   Pulmonary HTN (HCC) 05/12/2013   Bilateral lower extremity edema 05/12/2013   Morbid obesity with BMI  of 50.0-59.9, adult (HCC)    Shoulder joint pain 03/28/2013   Abnormal glucose level 02/20/2013   Alopecia 02/20/2013   Degeneration of lumbar intervertebral disc 02/20/2013   Hyperglycemia due to type 2 diabetes mellitus (HCC) 02/20/2013   Type 2 diabetes mellitus with other specified complication (HCC) 02/20/2013   Back pain, hx of prior back surgery 12/09/2011   Pancreatitis, mild, CT negative 12/08/2011   Fatty liver 12/08/2011   Substernal chest pain, suspected secondary to acute pancreatitis 12/06/2011   Hypertension 12/06/2011   Hypokalemia 12/06/2011   Hypothyroidism 12/06/2011   Anxiety 12/06/2011   BV (bacterial vaginosis)    Hyperlipidemia     PCP: Larnell Hamilton, MD  REFERRING PROVIDER: Jerri Kay HERO, MD  REFERRING DIAG: [phone] (ICD-10-CM) - S/P total knee arthroplasty, right  THERAPY DIAG:  Acute pain of right knee  Stiffness of right knee, not elsewhere classified  Difficulty in walking, not elsewhere classified  Muscle weakness (generalized)  Rationale for Evaluation and Treatment: Rehabilitation  ONSET DATE: 08/29/23  SUBJECTIVE:   SUBJECTIVE STATEMENT: Patient reports that she has been working on her knee flexion at least 2x/daily.   From eval: Pt reports it's been hurting really bad. Pt's daughter and granddaughter are currently at home helping her. Pt reports she got 1 week of HHPT. Has been doing the exercises provided to her. Had been using the cane prior to surgery but left it in [place] . Has been using RW in the house currently.   PERTINENT HISTORY: Arthritis, DDD, HTN, spinal cord stimulator. Eval and treat right knee. S/p right TKA 08/29/2023  PAIN:  Are you having pain? Yes: NPRS scale: 5/10 currently, can get up to a 10/10 Pain location: Front of R knee Pain description: Aching and throbbing Aggravating factors: Standing/walking, sometimes sitting Relieving factors: Ice, pain medication  PRECAUTIONS: None  RED FLAGS: None   WEIGHT  BEARING RESTRICTIONS: No  FALLS:  Has patient fallen in last 6 months? No  LIVING ENVIRONMENT: Lives with: granddaughter Lives in: House/apartment Stairs: No; 2 steps to porch Has following equipment at home: Vannie - 2 wheeled and bed side commode  OCCUPATION: Retired - likes to watch TV, go out to eat  PLOF: Independent  PATIENT GOALS: Wants to get off using any a/d  NEXT MD VISIT: 09/15/23  OBJECTIVE:  Note: Objective measures were completed at Evaluation unless otherwise noted.  DIAGNOSTIC FINDINGS: n/a  PATIENT SURVEYS:  Lower Extremity Functional Score: 16 / 80 = 20.0 %  COGNITION: Overall cognitive status: Within functional limits for tasks assessed     SENSATION: WFL no overt N/T  EDEMA:  Unable to accurately measure due to prevena wound vac not properly sealing down  MUSCLE LENGTH: Hamstrings: see knee ROM Thomas test: did not assess  POSTURE: flexed trunk   PALPATION: TTP along R knee joint line  LOWER EXTREMITY ROM:  Active ROM Right eval Left eval Right 09/22/23 Right 10/06/23 Right 10/12/23 Right 10/18/23  Hip flexion        Hip extension        Hip abduction        Hip adduction        Hip internal rotation        Hip external rotation        Knee flexion 73 sitting 100 sitting 87 sitting 92 sitting  82 sitting EOM with OP 88 AROM supine beginning   Knee extension -15 sitting -10 sitting -10 LAQ -8 quad set -5 quad set sitting    Ankle dorsiflexion        Ankle plantarflexion        Ankle inversion        Ankle eversion         (Blank rows = not tested)  LOWER EXTREMITY MMT:  MMT Right eval Left eval  Hip flexion 4 4  Hip extension    Hip abduction supine 3+ 4  Hip adduction    Hip internal rotation    Hip external rotation    Knee flexion 4 5  Knee extension 4 5  Ankle dorsiflexion    Ankle plantarflexion    Ankle inversion    Ankle eversion     (Blank rows = not tested)  LOWER EXTREMITY SPECIAL TESTS:  Did not  assess  FUNCTIONAL TESTS:  5x STS: 20.74 sec with UE  TUG: 39.38 sec with RW   GAIT: Distance walked: Into clinic Assistive device utilized: Environmental consultant - 2 wheeled Level of assistance: Modified independence Comments: Antalgic, trunk flexed, decreased R hip ext  TODAY'S TREATMENT:    [facility] Adult PT Treatment:                                                DATE: 10/18/2023  Therapeutic Exercise: Nustep L4 x 8 mins moving forward halfway through to promote knee flexion Seated heel slide with strap for overpression 2x10, 5 sec hold SAQ 2 x 10  Seated LAQ Seated heel slides with RTB for resistance  Sit<>stand  Updated and reviewed HEP    [facility] Adult PT Treatment:                                                DATE: 10/14/23 Therapeutic Exercise: Nustep L4 x 8 mins moving forward halfway through to promote knee flexion Seated heel slide foot on towel LLE assist 2x10 Seated knee flex/ext dangling/swinging 3x30 Seated LAQ eccentrics 2x10 Seated hip flexion 2x30 Seated quad set 3 hold 2x10 Seated heel/toe raise x30 Seated hamstring curl with RTB 2 x 10  Standing heel/toe raise 2x10 Sit<>stand 3x5 Modalities: Cold pack applied to right knee post session, patient in supine with BIL LE on bolster x8 mins (not billed)  [facility] Adult PT Treatment:  DATE: 10/12/23 Therapeutic Exercise: Nustep L4 x 8 mins moving forward halfway through to promote knee flexion Seated knee flex/ext dangling/swinging 3x30 Seated LAQ eccentrics 2x10 Seated hip flexion 2x30 Seated quad set 3 hold 2x10 Seated heel/toe raise x20 Seated hamstring curl with RTB 2 x 10  Standing heel/toe raise 2x10 Sit<>stand 2x5 Tailgate stretch with distraction into flexion EOM, therapist resistance Therapeutic Activity: Gait with SPC, moving cane to LUE, sequencing and form Modalities: Cold pack applied to right knee post session, patient in supine with BIL LE on bolster x8  mins (not billed)  [facility] Adult PT Treatment:                                                DATE: 10/07/2023  Therapeutic Exercise: Nustep L4 x 8 mins moving forward halfway through to promote knee flexion Seated knee flex/ext dangling/swinging 2x30 Seated LAQ eccentrics 2x10 Seated hip flexion 2x30 Seated quad set 3 hold 2x10 Seated heel/toe raise 2 x 30  Seated hamstring curl with RTB 2 x 10  Sit<>stand 2x5 to RW - cues to push from table  Modalities: Cold pack applied to right knee post session, patient in supine with BIL LE on bolster x8 mins (not billed)    PATIENT EDUCATION:  Education details: HEP, self care, s/s of blood clots Person educated: Patient Education method: Explanation, Demonstration, and Handouts Education comprehension: verbalized understanding, returned demonstration, and needs further education  HOME EXERCISE PROGRAM: Access Code: [email or web address] URL: [email or web address] Date: 09/14/2023 Prepared by: Gellen April Earnie Starring  Exercises - Supine Heel Slide with Strap  - 1 x daily - 7 x weekly - 2 sets - 10 reps - Hooklying Bilateral Isometric Clamshell  - 1 x daily - 7 x weekly - 2 sets - 10 reps - Supine Bridge  - 1 x daily - 7 x weekly - 2 sets - 10 reps - Supine Quad Set  - 1 x daily - 7 x weekly - 2 sets - 10 reps  ASSESSMENT:  CLINICAL IMPRESSION: 10/18/2023 Patient continues to be limited with ROM activities d/t pain. However she is demonstrating improvement of knee flexion today as compared to last treatment session. She reports decreased pain with seated heel slides against resistance band. We will continue with focus on regaining knee flexion and extension AROM.   From eval: Patient is a 63 y.o. F who was seen today for physical therapy evaluation and treatment s/p R TKA on 08/29/23. Pt has received 1 week of home health and states she has been compliant with her HEP. Assessment indicates limited R knee ROM, decreased strength  (especially in quad), and decreased balance affecting pt's safe mobility for home and in the community. TUG and 5x STS scores both indicate a high risk of falls. Pt will benefit from PT to improve on these issues, decrease pain and maximize level of function.   OBJECTIVE IMPAIRMENTS: Abnormal gait, decreased activity tolerance, decreased balance, decreased endurance, decreased mobility, difficulty walking, decreased ROM, decreased strength, hypomobility, increased fascial restrictions, increased muscle spasms, impaired flexibility, improper body mechanics, postural dysfunction, and pain.   ACTIVITY LIMITATIONS: carrying, lifting, bending, sitting, standing, squatting, sleeping, stairs, transfers, bed mobility, bathing, toileting, dressing, hygiene/grooming, and locomotion level  PARTICIPATION LIMITATIONS: meal prep, cleaning, laundry, driving, shopping, and community activity  PERSONAL FACTORS: Age, Fitness, Past/current experiences, and Time since onset of injury/illness/exacerbation  are also affecting patient's functional outcome.   REHAB POTENTIAL: Good  CLINICAL DECISION MAKING: Evolving/moderate complexity  EVALUATION COMPLEXITY: Moderate   GOALS: Goals reviewed with patient? Yes  SHORT TERM GOALS: Target date: 10/12/2023  Pt will be ind with initial HEP Baseline: Goal status: INITIAL  2.  Pt will have improved R knee ROM to >/=5-90 deg Baseline:  Goal status: MET 10/06/23: 5-92  3.  Pt will have improved 5x STS to </=15 sec to demo increasing functional LE strength Baseline:  Goal status: INITIAL    LONG TERM GOALS: Target date: 11/09/2023   Pt will be ind with management and progression of HEP Baseline:  Goal status: INITIAL  2.  Pt will have improved 5x STS to </=13 sec to be considered a low fall risk Baseline:  Goal status: INITIAL  3.  Pt will have improved TUG with LRAD to </=15 sec to demo decreased fall risk Baseline:  Goal status: INITIAL  4.  Pt will  be able to amb limited community distances (at least 500') independently Baseline:  Goal status: INITIAL  5.  Pt will have improved LEFS to >/=25/80 to demo MCID for TKA Baseline: 16 Goal status: INITIAL  6.  Pt will have improved knee ROM to >/=5-110 deg to safely negotiate stairs within the community Baseline:  Goal status: INITIAL   PLAN:  PT FREQUENCY: 2x/week  PT DURATION: 8 weeks  PLANNED INTERVENTIONS: 97164- PT Re-evaluation, 97750- Physical Performance Testing, 97110-Therapeutic exercises, 97530- Therapeutic activity, 97112- Neuromuscular re-education, 97535- Self Care, [id number]- Manual therapy, [id number]- Gait training, [phone]- Aquatic Therapy, [phone]- Ultrasound, 79439 (1-2 muscles), 20561 (3+ muscles)- Dry Needling, Patient/Family education, Balance training, Stair training, Taping, Joint mobilization, Scar mobilization, Cryotherapy, and Moist heat   [employer] medicaid -- doesn't cover vaso, estim, ionto  PLAN FOR NEXT SESSION: Assess response to HEP. Work on knee ROM, quad and hip strength. Endurance - please check pulse ox next visit   Marko Molt, PT, DPT  10/18/2023 4:02 PM

## 2023-10-20 ENCOUNTER — Other Ambulatory Visit: Payer: Self-pay | Admitting: Internal Medicine

## 2023-10-20 ENCOUNTER — Ambulatory Visit (INDEPENDENT_AMBULATORY_CARE_PROVIDER_SITE_OTHER): Admitting: Physician Assistant

## 2023-10-20 ENCOUNTER — Other Ambulatory Visit (INDEPENDENT_AMBULATORY_CARE_PROVIDER_SITE_OTHER): Payer: Self-pay

## 2023-10-20 ENCOUNTER — Other Ambulatory Visit: Payer: Self-pay | Admitting: Physical Medicine and Rehabilitation

## 2023-10-20 DIAGNOSIS — Z96651 Presence of right artificial knee joint: Secondary | ICD-10-CM

## 2023-10-20 DIAGNOSIS — R928 Other abnormal and inconclusive findings on diagnostic imaging of breast: Secondary | ICD-10-CM

## 2023-10-20 NOTE — Progress Notes (Signed)
 Post-Op Visit Note   Patient: [name]           Date of Birth: 03-04-1960           MRN: [id number] Visit Date: 10/20/2023 PCP: Larnell Hamilton, MD   Assessment & Plan:  Chief Complaint:  Chief Complaint  Patient presents with   Right Knee - Follow-up    Right TKA 08/29/2023   Visit Diagnoses:  1. Status post total right knee replacement     Plan: Patient is a pleasant 63 year old female who comes in today 6 weeks status post right total knee replacement 08/29/2023.  She has been doing much better.  She is currently not taking anything for pain.  She did not take aspirin  for DVT prophylaxis once she finished Eliquis .  She has been in physical therapy.  Examination of her right knee reveals range of motion from 0 to 100 degrees.  She is stable to valgus and varus stress.  She is neurovascular intact distally.  At this point, would like for her to continue pushing things and therapy.  She will also work on a home exercise program.  She will follow-up in 6 weeks for recheck.  Call with concerns or questions.  Follow-Up Instructions: Return in about 6 weeks (around 12/01/2023).   Orders:  Orders Placed This Encounter  Procedures   XR Knee 1-2 Views Right   No orders of the defined types were placed in this encounter.   Imaging: XR Knee 1-2 Views Right Result Date: 10/20/2023 Well-seated prosthesis without complication   PMFS History: Patient Active Problem List   Diagnosis Date Noted   S/P TKR (total knee replacement), right 08/30/2023   Status post total right knee replacement 08/29/2023   Primary osteoarthritis of right knee 05/03/2023   Primary osteoarthritis of left knee 05/03/2023   Memory change 12/25/2020   Chronic pain syndrome 05/04/2019   Raynaud's disease 04/13/2019   Lumbar radiculopathy 12/18/2018   Spinal stenosis of lumbar region with neurogenic claudication 12/18/2018   Post laminectomy syndrome 12/18/2018   Bilateral primary osteoarthritis of knee  12/27/2016   History of excision of intestinal structure 09/07/2016   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 08/23/2016   Prediabetes 12/03/2015   OSA on CPAP 02/19/2015   Lung field abnormal 11/11/2014   Morbid obesity (HCC) 10/09/2014   Constipation 06/14/2014   Peripheral venous insufficiency 05/25/2013   Pulmonary HTN (HCC) 05/12/2013   Bilateral lower extremity edema 05/12/2013   Morbid obesity with BMI of 50.0-59.9, adult (HCC)    Shoulder joint pain 03/28/2013   Abnormal glucose level 02/20/2013   Alopecia 02/20/2013   Degeneration of lumbar intervertebral disc 02/20/2013   Hyperglycemia due to type 2 diabetes mellitus (HCC) 02/20/2013   Type 2 diabetes mellitus with other specified complication (HCC) 02/20/2013   Back pain, hx of prior back surgery 12/09/2011   Pancreatitis, mild, CT negative 12/08/2011   Fatty liver 12/08/2011   Substernal chest pain, suspected secondary to acute pancreatitis 12/06/2011   Hypertension 12/06/2011   Hypokalemia 12/06/2011   Hypothyroidism 12/06/2011   Anxiety 12/06/2011   BV (bacterial vaginosis)    Hyperlipidemia    Past Medical History:  Diagnosis Date   Anemia when she was young   Arthritis    Congestion of nasal sinus    Constipation    takes Colace every other day   DDD (degenerative disc disease), lumbar    Dependent edema    Dry eyes    uses Systane Eye  drops daily as needed   GERD (gastroesophageal reflux disease) 12/06/2011   takes Nexium  daily   Headache(784.0)    occasionally-d/t congestion   History of bronchitis 6-7 yrs ago   History of kidney stones    Hyperlipidemia    Hypertension    takes Metoprolol  and Diovan  daily   Hypothyroidism 12/06/2011   takes Synthroid  daily   Joint pain    Multiple allergies    takes Zyrtec daily;uses Nasonex daily as needed   Nocturia    Numbness    weakness-right hand   Onychomycosis    OSA on CPAP    Wears CPAP Nightly   Pancreatitis    Peripheral edema    takes  Furosemide  daily   Pre-diabetes    S/P insertion of spinal cord stimulator    Shingles    Urinary frequency    Venous insufficiency of leg     Family History  Problem Relation Age of Onset   Hypertension Mother    Dementia Mother    Alzheimer's disease Mother    Hypertension Father    Hypertension Sister    Kidney disease Sister        KIDNEL TRANSPLANT   Thyroid  disease Sister    Thyroid  disease Sister    Hypertension Sister    Breast cancer Maternal Aunt        >50; passed away from it   Breast cancer Maternal Aunt        >50    Past Surgical History:  Procedure Laterality Date   ABDOMINAL HYSTERECTOMY     uterine prolapse   BACK SURGERY     L4-5   CARDIAC CATHETERIZATION  2007   CATARACT EXTRACTION, BILATERAL  2023   CHOLECYSTECTOMY     COLONOSCOPY     ESOPHAGOGASTRODUODENOSCOPY  5-71yrs ago   FOOT SURGERY Bilateral    KNEE ARTHROSCOPY Right    LAMINECTOMY THORACIC SPINE W/ PLACEMENT SPINAL CORD STIMULATOR     LAPAROSCOPIC GASTRIC SLEEVE RESECTION N/A 08/23/2016   Procedure: LAPAROSCOPIC GASTRIC SLEEVE RESECTION WITH UPPER ENDO;  Surgeon: Gladis Cough, MD;  Location: [facility];  Service: General;  Laterality: N/A;   POSTERIOR LUMBAR FUSION  03/10/2009   L3-4 and L3-4 fusion, Dr. Catalina Stains   SHOULDER ARTHROSCOPY WITH ROTATOR CUFF REPAIR AND SUBACROMIAL DECOMPRESSION Right 08/23/2013   Procedure: RIGHT SHOULDER ARTHROSCOPY WITH  SUBACROMIAL DECOMPRESSION DISTAL CLAVICLE RESECTION AND  ROTATOR CUFF REPAIR ;  Surgeon: Franky CHRISTELLA Pointer, MD;  Location: [facility];  Service: Orthopedics;  Laterality: Right;   TOTAL KNEE ARTHROPLASTY Right 08/29/2023   Procedure: ARTHROPLASTY, KNEE, TOTAL;  Surgeon: Jerri Kay CHRISTELLA, MD;  Location: [facility];  Service: Orthopedics;  Laterality: Right;   TUBAL LIGATION     Social History   Occupational History    Employer: [employer]    Comment: [employer]  Tobacco Use   Smoking status: Former    Current packs/day: 0.00    Types: Cigarettes    Quit  date: 1993    Years since quitting: 32.7   Smokeless tobacco: Never   Tobacco comments:    quit smoking in 1993  Vaping Use   Vaping status: Never Used  Substance and Sexual Activity   Alcohol  use: Yes    Comment: rarely; mixed drink   Drug use: No   Sexual activity: Not Currently    Partners: Male    Birth control/protection: Surgical    Comment: Hysterectomy

## 2023-10-26 ENCOUNTER — Telehealth: Payer: Self-pay

## 2023-10-26 NOTE — Telephone Encounter (Signed)
 TC due to missed visit.  Spoke directly to patient who had forgotten appointment.  Instructed to call front office to schedule future appointments as no further PT appointments had been scheduled.

## 2023-10-27 ENCOUNTER — Ambulatory Visit: Attending: Orthopaedic Surgery | Admitting: Physical Therapy

## 2023-10-27 ENCOUNTER — Encounter: Payer: Self-pay | Admitting: Physical Therapy

## 2023-10-27 DIAGNOSIS — M25661 Stiffness of right knee, not elsewhere classified: Secondary | ICD-10-CM | POA: Insufficient documentation

## 2023-10-27 DIAGNOSIS — R262 Difficulty in walking, not elsewhere classified: Secondary | ICD-10-CM | POA: Diagnosis present

## 2023-10-27 DIAGNOSIS — M25561 Pain in right knee: Secondary | ICD-10-CM | POA: Insufficient documentation

## 2023-10-27 DIAGNOSIS — M6281 Muscle weakness (generalized): Secondary | ICD-10-CM | POA: Diagnosis present

## 2023-10-27 NOTE — Therapy (Signed)
 OUTPATIENT PHYSICAL THERAPY TREATMENT NOTE   Patient Name: [name] MRN: [id number] DOB:09-27-1960, 63 y.o., female Today's Date: 10/27/2023  END OF SESSION:  PT End of Session - 10/27/23 1124     Visit Number 10    Number of Visits 16    Date for Recertification  11/09/23    Authorization Type [employer]    Authorization Time Period Approved 13 visits 09/14/23-12/13/23    Authorization - Visit Number 9    Authorization - Number of Visits 13    PT Start Time 1045    PT Stop Time 1125    PT Time Calculation (min) 40 min            Past Medical History:  Diagnosis Date   Anemia when she was young   Arthritis    Congestion of nasal sinus    Constipation    takes Colace every other day   DDD (degenerative disc disease), lumbar    Dependent edema    Dry eyes    uses Systane Eye drops daily as needed   GERD (gastroesophageal reflux disease) 12/06/2011   takes Nexium  daily   Headache(784.0)    occasionally-d/t congestion   History of bronchitis 6-7 yrs ago   History of kidney stones    Hyperlipidemia    Hypertension    takes Metoprolol  and Diovan  daily   Hypothyroidism 12/06/2011   takes Synthroid  daily   Joint pain    Multiple allergies    takes Zyrtec daily;uses Nasonex daily as needed   Nocturia    Numbness    weakness-right hand   Onychomycosis    OSA on CPAP    Wears CPAP Nightly   Pancreatitis    Peripheral edema    takes Furosemide  daily   Pre-diabetes    S/P insertion of spinal cord stimulator    Shingles    Urinary frequency    Venous insufficiency of leg    Past Surgical History:  Procedure Laterality Date   ABDOMINAL HYSTERECTOMY     uterine prolapse   BACK SURGERY     L4-5   CARDIAC CATHETERIZATION  2007   CATARACT EXTRACTION, BILATERAL  2023   CHOLECYSTECTOMY     COLONOSCOPY     ESOPHAGOGASTRODUODENOSCOPY  5-34yrs ago   FOOT SURGERY Bilateral    KNEE ARTHROSCOPY Right    LAMINECTOMY THORACIC SPINE W/ PLACEMENT  SPINAL CORD STIMULATOR     LAPAROSCOPIC GASTRIC SLEEVE RESECTION N/A 08/23/2016   Procedure: LAPAROSCOPIC GASTRIC SLEEVE RESECTION WITH UPPER ENDO;  Surgeon: Gladis Cough, MD;  Location: [facility];  Service: General;  Laterality: N/A;   POSTERIOR LUMBAR FUSION  03/10/2009   L3-4 and L3-4 fusion, Dr. Catalina Stains   SHOULDER ARTHROSCOPY WITH ROTATOR CUFF REPAIR AND SUBACROMIAL DECOMPRESSION Right 08/23/2013   Procedure: RIGHT SHOULDER ARTHROSCOPY WITH  SUBACROMIAL DECOMPRESSION DISTAL CLAVICLE RESECTION AND  ROTATOR CUFF REPAIR ;  Surgeon: Franky CHRISTELLA Pointer, MD;  Location: [facility];  Service: Orthopedics;  Laterality: Right;   TOTAL KNEE ARTHROPLASTY Right 08/29/2023   Procedure: ARTHROPLASTY, KNEE, TOTAL;  Surgeon: Jerri Kay CHRISTELLA, MD;  Location: [facility];  Service: Orthopedics;  Laterality: Right;   TUBAL LIGATION     Patient Active Problem List   Diagnosis Date Noted   S/P TKR (total knee replacement), right 08/30/2023   Status post total right knee replacement 08/29/2023   Primary osteoarthritis of right knee 05/03/2023   Primary osteoarthritis of left knee 05/03/2023   Memory change 12/25/2020  Chronic pain syndrome 05/04/2019   Raynaud's disease 04/13/2019   Lumbar radiculopathy 12/18/2018   Spinal stenosis of lumbar region with neurogenic claudication 12/18/2018   Post laminectomy syndrome 12/18/2018   Bilateral primary osteoarthritis of knee 12/27/2016   History of excision of intestinal structure 09/07/2016   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 08/23/2016   Prediabetes 12/03/2015   OSA on CPAP 02/19/2015   Lung field abnormal 11/11/2014   Morbid obesity (HCC) 10/09/2014   Constipation 06/14/2014   Peripheral venous insufficiency 05/25/2013   Pulmonary HTN (HCC) 05/12/2013   Bilateral lower extremity edema 05/12/2013   Morbid obesity with BMI of 50.0-59.9, adult (HCC)    Shoulder joint pain 03/28/2013   Abnormal glucose level 02/20/2013   Alopecia 02/20/2013   Degeneration  of lumbar intervertebral disc 02/20/2013   Hyperglycemia due to type 2 diabetes mellitus (HCC) 02/20/2013   Type 2 diabetes mellitus with other specified complication (HCC) 02/20/2013   Back pain, hx of prior back surgery 12/09/2011   Pancreatitis, mild, CT negative 12/08/2011   Fatty liver 12/08/2011   Substernal chest pain, suspected secondary to acute pancreatitis 12/06/2011   Hypertension 12/06/2011   Hypokalemia 12/06/2011   Hypothyroidism 12/06/2011   Anxiety 12/06/2011   BV (bacterial vaginosis)    Hyperlipidemia     PCP: Larnell Hamilton, MD  REFERRING PROVIDER: Jerri Kay HERO, MD  REFERRING DIAG: [phone] (ICD-10-CM) - S/P total knee arthroplasty, right  THERAPY DIAG:  Acute pain of right knee  Stiffness of right knee, not elsewhere classified  Difficulty in walking, not elsewhere classified  Muscle weakness (generalized)  Rationale for Evaluation and Treatment: Rehabilitation  ONSET DATE: 08/29/23  SUBJECTIVE:   SUBJECTIVE STATEMENT: Pt attended today's session with reports of 2/10 pain. Pt stated that they have maintained good compliance with current HEP.  Not taking any pain medications at this point   From eval: Pt reports it's been hurting really bad. Pt's daughter and granddaughter are currently at home helping her. Pt reports she got 1 week of HHPT. Has been doing the exercises provided to her. Had been using the cane prior to surgery but left it in [place] . Has been using RW in the house currently.   PERTINENT HISTORY: Arthritis, DDD, HTN, spinal cord stimulator. Eval and treat right knee. S/p right TKA 08/29/2023  PAIN:  Are you having pain? Yes: NPRS scale: 5/10 currently, can get up to a 10/10 Pain location: Front of R knee Pain description: Aching and throbbing Aggravating factors: Standing/walking, sometimes sitting Relieving factors: Ice, pain medication  PRECAUTIONS: None  RED FLAGS: None   WEIGHT BEARING RESTRICTIONS: No  FALLS:  Has  patient fallen in last 6 months? No  LIVING ENVIRONMENT: Lives with: granddaughter Lives in: House/apartment Stairs: No; 2 steps to porch Has following equipment at home: Vannie - 2 wheeled and bed side commode  OCCUPATION: Retired - likes to watch TV, go out to eat  PLOF: Independent  PATIENT GOALS: Wants to get off using any a/d  NEXT MD VISIT: 09/15/23  OBJECTIVE:  Note: Objective measures were completed at Evaluation unless otherwise noted.  DIAGNOSTIC FINDINGS: n/a  PATIENT SURVEYS:  Lower Extremity Functional Score: 16 / 80 = 20.0 %  COGNITION: Overall cognitive status: Within functional limits for tasks assessed     SENSATION: WFL no overt N/T  EDEMA:  Unable to accurately measure due to prevena wound vac not properly sealing down  MUSCLE LENGTH: Hamstrings: see knee ROM Thomas test: did not assess  POSTURE: flexed  trunk   PALPATION: TTP along R knee joint line  LOWER EXTREMITY ROM:  Active ROM Right eval Left eval Right 09/22/23 Right 10/06/23 Right 10/12/23 Right 10/18/23  Hip flexion        Hip extension        Hip abduction        Hip adduction        Hip internal rotation        Hip external rotation        Knee flexion 73 sitting 100 sitting 87 sitting 92 sitting  82 sitting EOM with OP 88 AROM supine beginning   Knee extension -15 sitting -10 sitting -10 LAQ -8 quad set -5 quad set sitting    Ankle dorsiflexion        Ankle plantarflexion        Ankle inversion        Ankle eversion         (Blank rows = not tested)  LOWER EXTREMITY MMT:  MMT Right eval Left eval  Hip flexion 4 4  Hip extension    Hip abduction supine 3+ 4  Hip adduction    Hip internal rotation    Hip external rotation    Knee flexion 4 5  Knee extension 4 5  Ankle dorsiflexion    Ankle plantarflexion    Ankle inversion    Ankle eversion     (Blank rows = not tested)  LOWER EXTREMITY SPECIAL TESTS:  Did not assess  FUNCTIONAL TESTS:  5x STS: 20.74  sec with UE  TUG: 39.38 sec with RW   GAIT: Distance walked: Into clinic Assistive device utilized: Environmental consultant - 2 wheeled Level of assistance: Modified independence Comments: Antalgic, trunk flexed, decreased R hip ext  TODAY'S TREATMENT:   [facility] Adult PT Treatment:                                                DATE: 10/27/2023 Therapeutic Exercise: Rec bike 8' Heel slide on board with strap for overpressure, quad set at end of movement 1x20, hold 3s Therapeutic Activity: Standing hamstring curl 2x12 B, hold 2s, 2# cuff Standing marching 2x12,B 1s hold, 2# cuff Standing abduction 2x 12, hold 2s, 2# cuff Standing TKE with ball into wall 1x12, 8s   [facility] Adult PT Treatment:                                                DATE: 10/18/2023  Therapeutic Exercise: Nustep L4 x 8 mins moving forward halfway through to promote knee flexion Seated heel slide with strap for overpression 2x10, 5 sec hold SAQ 2 x 10  Seated LAQ Seated heel slides with RTB for resistance  Sit<>stand  Updated and reviewed HEP    [facility] Adult PT Treatment:                                                DATE: 10/14/23 Therapeutic Exercise: Nustep L4 x 8 mins moving forward halfway through to promote knee flexion Seated heel slide foot on towel LLE assist 2x10 Seated knee  flex/ext dangling/swinging 3x30 Seated LAQ eccentrics 2x10 Seated hip flexion 2x30 Seated quad set 3 hold 2x10 Seated heel/toe raise x30 Seated hamstring curl with RTB 2 x 10  Standing heel/toe raise 2x10 Sit<>stand 3x5 Modalities: Cold pack applied to right knee post session, patient in supine with BIL LE on bolster x8 mins (not billed)  [facility] Adult PT Treatment:                                                DATE: 10/12/23 Therapeutic Exercise: Nustep L4 x 8 mins moving forward halfway through to promote knee flexion Seated knee flex/ext dangling/swinging 3x30 Seated LAQ eccentrics 2x10 Seated hip flexion 2x30 Seated quad set  3 hold 2x10 Seated heel/toe raise x20 Seated hamstring curl with RTB 2 x 10  Standing heel/toe raise 2x10 Sit<>stand 2x5 Tailgate stretch with distraction into flexion EOM, therapist resistance Therapeutic Activity: Gait with SPC, moving cane to LUE, sequencing and form Modalities: Cold pack applied to right knee post session, patient in supine with BIL LE on bolster x8 mins (not billed)  [facility] Adult PT Treatment:                                                DATE: 10/07/2023  Therapeutic Exercise: Nustep L4 x 8 mins moving forward halfway through to promote knee flexion Seated knee flex/ext dangling/swinging 2x30 Seated LAQ eccentrics 2x10 Seated hip flexion 2x30 Seated quad set 3 hold 2x10 Seated heel/toe raise 2 x 30  Seated hamstring curl with RTB 2 x 10  Sit<>stand 2x5 to RW - cues to push from table  Modalities: Cold pack applied to right knee post session, patient in supine with BIL LE on bolster x8 mins (not billed)    PATIENT EDUCATION:  Education details: HEP, self care, s/s of blood clots Person educated: Patient Education method: Explanation, Demonstration, and Handouts Education comprehension: verbalized understanding, returned demonstration, and needs further education  HOME EXERCISE PROGRAM: Access Code: [email or web address] URL: [email or web address] Date: 09/14/2023 Prepared by: Gellen April Earnie Starring  Exercises - Supine Heel Slide with Strap  - 1 x daily - 7 x weekly - 2 sets - 10 reps - Hooklying Bilateral Isometric Clamshell  - 1 x daily - 7 x weekly - 2 sets - 10 reps - Supine Bridge  - 1 x daily - 7 x weekly - 2 sets - 10 reps - Supine Quad Set  - 1 x daily - 7 x weekly - 2 sets - 10 reps  ASSESSMENT:  CLINICAL IMPRESSION: Pt attended physical therapy session for continuation of treatment regarding R TKA. Today's treatment focused on improvement of  quad recruitment, proximal hip strength, WB tolerance, and R knee AROM. Pt is  demonstrating 2-106 Pt showed great tolerance to administered treatment with no adverse effects by the end of session. Skilled intervention was utilized via activity modification for pt tolerance with task completion, functional progression/regression promoting best outcomes inline with current rehab goals, as well as moderate verbal/tactile cuing alongside no physical assistance for safe and appropriate performance of today's activities. Continue with therapeutic focus on current POC outline, progressing eccentric control for quad recruitment, AROM and WB tolerance to begin working towards higher quality gait with SPC.  From eval: Patient is a 63 y.o. F who was seen today for physical therapy evaluation and treatment s/p R TKA on 08/29/23. Pt has received 1 week of home health and states she has been compliant with her HEP. Assessment indicates limited R knee ROM, decreased strength (especially in quad), and decreased balance affecting pt's safe mobility for home and in the community. TUG and 5x STS scores both indicate a high risk of falls. Pt will benefit from PT to improve on these issues, decrease pain and maximize level of function.   OBJECTIVE IMPAIRMENTS: Abnormal gait, decreased activity tolerance, decreased balance, decreased endurance, decreased mobility, difficulty walking, decreased ROM, decreased strength, hypomobility, increased fascial restrictions, increased muscle spasms, impaired flexibility, improper body mechanics, postural dysfunction, and pain.   ACTIVITY LIMITATIONS: carrying, lifting, bending, sitting, standing, squatting, sleeping, stairs, transfers, bed mobility, bathing, toileting, dressing, hygiene/grooming, and locomotion level  PARTICIPATION LIMITATIONS: meal prep, cleaning, laundry, driving, shopping, and community activity  PERSONAL FACTORS: Age, Fitness, Past/current experiences, and Time since onset of injury/illness/exacerbation are also affecting patient's functional  outcome.   REHAB POTENTIAL: Good  CLINICAL DECISION MAKING: Evolving/moderate complexity  EVALUATION COMPLEXITY: Moderate   GOALS: Goals reviewed with patient? Yes  SHORT TERM GOALS: Target date: 10/12/2023  Pt will be ind with initial HEP Baseline: Goal status: INITIAL  2.  Pt will have improved R knee ROM to >/=5-90 deg Baseline:  Goal status: MET 10/06/23: 5-92  3.  Pt will have improved 5x STS to </=15 sec to demo increasing functional LE strength Baseline:  Goal status: INITIAL    LONG TERM GOALS: Target date: 11/09/2023   Pt will be ind with management and progression of HEP Baseline:  Goal status: INITIAL  2.  Pt will have improved 5x STS to </=13 sec to be considered a low fall risk Baseline:  Goal status: INITIAL  3.  Pt will have improved TUG with LRAD to </=15 sec to demo decreased fall risk Baseline:  Goal status: INITIAL  4.  Pt will be able to amb limited community distances (at least 500') independently Baseline:  Goal status: INITIAL  5.  Pt will have improved LEFS to >/=25/80 to demo MCID for TKA Baseline: 16 Goal status: INITIAL  6.  Pt will have improved knee ROM to >/=5-110 deg to safely negotiate stairs within the community Baseline:  Goal status: INITIAL   PLAN:  PT FREQUENCY: 2x/week  PT DURATION: 8 weeks  PLANNED INTERVENTIONS: 97164- PT Re-evaluation, 97750- Physical Performance Testing, 97110-Therapeutic exercises, 97530- Therapeutic activity, 97112- Neuromuscular re-education, 97535- Self Care, [id number]- Manual therapy, [phone]- Gait training, [phone]- Aquatic Therapy, [phone]- Ultrasound, [phone] (1-2 muscles), 20561 (3+ muscles)- Dry Needling, Patient/Family education, Balance training, Stair training, Taping, Joint mobilization, Scar mobilization, Cryotherapy, and Moist heat   [employer] medicaid -- doesn't cover vaso, estim, ionto  PLAN FOR NEXT SESSION: Continue with therapeutic focus on current POC outline, progressing  eccentric control for quad recruitment, AROM and WB tolerance to begin working towards higher quality gait with SPC.  Mabel Kiang, PT, DPT 10/27/2023, 11:25 AM

## 2023-10-28 ENCOUNTER — Ambulatory Visit

## 2023-10-31 ENCOUNTER — Ambulatory Visit

## 2023-10-31 DIAGNOSIS — M6281 Muscle weakness (generalized): Secondary | ICD-10-CM

## 2023-10-31 DIAGNOSIS — M25561 Pain in right knee: Secondary | ICD-10-CM

## 2023-10-31 DIAGNOSIS — R262 Difficulty in walking, not elsewhere classified: Secondary | ICD-10-CM

## 2023-10-31 DIAGNOSIS — M25661 Stiffness of right knee, not elsewhere classified: Secondary | ICD-10-CM

## 2023-10-31 NOTE — Therapy (Signed)
 OUTPATIENT PHYSICAL THERAPY TREATMENT NOTE   Patient Name: [name] MRN: [id number] DOB:1960/02/28, 63 y.o., female Today's Date: 10/31/2023  END OF SESSION:  PT End of Session - 10/31/23 1104     Visit Number 11    Number of Visits 16    Date for Recertification  11/09/23    Authorization Type [employer]    Authorization Time Period Approved 13 visits 09/14/23-12/13/23    Authorization - Visit Number 10    Authorization - Number of Visits 13    PT Start Time 1101    PT Stop Time 1139    PT Time Calculation (min) 38 min             Past Medical History:  Diagnosis Date   Anemia when she was young   Arthritis    Congestion of nasal sinus    Constipation    takes Colace every other day   DDD (degenerative disc disease), lumbar    Dependent edema    Dry eyes    uses Systane Eye drops daily as needed   GERD (gastroesophageal reflux disease) 12/06/2011   takes Nexium  daily   Headache(784.0)    occasionally-d/t congestion   History of bronchitis 6-7 yrs ago   History of kidney stones    Hyperlipidemia    Hypertension    takes Metoprolol  and Diovan  daily   Hypothyroidism 12/06/2011   takes Synthroid  daily   Joint pain    Multiple allergies    takes Zyrtec daily;uses Nasonex daily as needed   Nocturia    Numbness    weakness-right hand   Onychomycosis    OSA on CPAP    Wears CPAP Nightly   Pancreatitis    Peripheral edema    takes Furosemide  daily   Pre-diabetes    S/P insertion of spinal cord stimulator    Shingles    Urinary frequency    Venous insufficiency of leg    Past Surgical History:  Procedure Laterality Date   ABDOMINAL HYSTERECTOMY     uterine prolapse   BACK SURGERY     L4-5   CARDIAC CATHETERIZATION  2007   CATARACT EXTRACTION, BILATERAL  2023   CHOLECYSTECTOMY     COLONOSCOPY     ESOPHAGOGASTRODUODENOSCOPY  5-68yrs ago   FOOT SURGERY Bilateral    KNEE ARTHROSCOPY Right    LAMINECTOMY THORACIC SPINE W/ PLACEMENT  SPINAL CORD STIMULATOR     LAPAROSCOPIC GASTRIC SLEEVE RESECTION N/A 08/23/2016   Procedure: LAPAROSCOPIC GASTRIC SLEEVE RESECTION WITH UPPER ENDO;  Surgeon: Gladis Cough, MD;  Location: [facility];  Service: General;  Laterality: N/A;   POSTERIOR LUMBAR FUSION  03/10/2009   L3-4 and L3-4 fusion, Dr. Catalina Stains   SHOULDER ARTHROSCOPY WITH ROTATOR CUFF REPAIR AND SUBACROMIAL DECOMPRESSION Right 08/23/2013   Procedure: RIGHT SHOULDER ARTHROSCOPY WITH  SUBACROMIAL DECOMPRESSION DISTAL CLAVICLE RESECTION AND  ROTATOR CUFF REPAIR ;  Surgeon: Franky CHRISTELLA Pointer, MD;  Location: [facility];  Service: Orthopedics;  Laterality: Right;   TOTAL KNEE ARTHROPLASTY Right 08/29/2023   Procedure: ARTHROPLASTY, KNEE, TOTAL;  Surgeon: Jerri Kay CHRISTELLA, MD;  Location: [facility];  Service: Orthopedics;  Laterality: Right;   TUBAL LIGATION     Patient Active Problem List   Diagnosis Date Noted   S/P TKR (total knee replacement), right 08/30/2023   Status post total right knee replacement 08/29/2023   Primary osteoarthritis of right knee 05/03/2023   Primary osteoarthritis of left knee 05/03/2023   Memory change 12/25/2020  Chronic pain syndrome 05/04/2019   Raynaud's disease 04/13/2019   Lumbar radiculopathy 12/18/2018   Spinal stenosis of lumbar region with neurogenic claudication 12/18/2018   Post laminectomy syndrome 12/18/2018   Bilateral primary osteoarthritis of knee 12/27/2016   History of excision of intestinal structure 09/07/2016   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 08/23/2016   Prediabetes 12/03/2015   OSA on CPAP 02/19/2015   Lung field abnormal 11/11/2014   Morbid obesity (HCC) 10/09/2014   Constipation 06/14/2014   Peripheral venous insufficiency 05/25/2013   Pulmonary HTN (HCC) 05/12/2013   Bilateral lower extremity edema 05/12/2013   Morbid obesity with BMI of 50.0-59.9, adult (HCC)    Shoulder joint pain 03/28/2013   Abnormal glucose level 02/20/2013   Alopecia 02/20/2013   Degeneration  of lumbar intervertebral disc 02/20/2013   Hyperglycemia due to type 2 diabetes mellitus (HCC) 02/20/2013   Type 2 diabetes mellitus with other specified complication (HCC) 02/20/2013   Back pain, hx of prior back surgery 12/09/2011   Pancreatitis, mild, CT negative 12/08/2011   Fatty liver 12/08/2011   Substernal chest pain, suspected secondary to acute pancreatitis 12/06/2011   Hypertension 12/06/2011   Hypokalemia 12/06/2011   Hypothyroidism 12/06/2011   Anxiety 12/06/2011   BV (bacterial vaginosis)    Hyperlipidemia     PCP: Larnell Hamilton, MD  REFERRING PROVIDER: Jerri Kay HERO, MD  REFERRING DIAG: [phone] (ICD-10-CM) - S/P total knee arthroplasty, right  THERAPY DIAG:  Acute pain of right knee  Stiffness of right knee, not elsewhere classified  Difficulty in walking, not elsewhere classified  Muscle weakness (generalized)  Rationale for Evaluation and Treatment: Rehabilitation  ONSET DATE: 08/29/23  SUBJECTIVE:   SUBJECTIVE STATEMENT: Pt presents to PT with no current pain. Has been compliant with HEP no adverse effect.   From eval: Pt reports it's been hurting really bad. Pt's daughter and granddaughter are currently at home helping her. Pt reports she got 1 week of HHPT. Has been doing the exercises provided to her. Had been using the cane prior to surgery but left it in [place] . Has been using RW in the house currently.   PERTINENT HISTORY: Arthritis, DDD, HTN, spinal cord stimulator. Eval and treat right knee. S/p right TKA 08/29/2023  PAIN:  Are you having pain?  Yes: NPRS scale: 5/10 currently, can get up to a 10/10 Pain location: Front of R knee Pain description: Aching and throbbing Aggravating factors: Standing/walking, sometimes sitting Relieving factors: Ice, pain medication  PRECAUTIONS: None  RED FLAGS: None   WEIGHT BEARING RESTRICTIONS: No  FALLS:  Has patient fallen in last 6 months? No  LIVING ENVIRONMENT: Lives with:  granddaughter Lives in: House/apartment Stairs: No; 2 steps to porch Has following equipment at home: Vannie - 2 wheeled and bed side commode  OCCUPATION: Retired - likes to watch TV, go out to eat  PLOF: Independent  PATIENT GOALS: Wants to get off using any a/d  NEXT MD VISIT: 09/15/23  OBJECTIVE:  Note: Objective measures were completed at Evaluation unless otherwise noted.  DIAGNOSTIC FINDINGS: n/a  PATIENT SURVEYS:  Lower Extremity Functional Score: 16 / 80 = 20.0 %  COGNITION: Overall cognitive status: Within functional limits for tasks assessed     SENSATION: WFL no overt N/T  EDEMA:  Unable to accurately measure due to prevena wound vac not properly sealing down  MUSCLE LENGTH: Hamstrings: see knee ROM Thomas test: did not assess  POSTURE: flexed trunk   PALPATION: TTP along R knee joint line  LOWER  EXTREMITY ROM:  Active ROM Right eval Left eval Right 09/22/23 Right 10/06/23 Right 10/12/23 Right 10/18/23 Right 10/31/2023  Hip flexion         Hip extension         Hip abduction         Hip adduction         Hip internal rotation         Hip external rotation         Knee flexion 73 sitting 100 sitting 87 sitting 92 sitting  82 sitting EOM with OP 88 AROM supine beginning  106 AAROM  Knee extension -15 sitting -10 sitting -10 LAQ -8 quad set -5 quad set sitting   4  Ankle dorsiflexion         Ankle plantarflexion         Ankle inversion         Ankle eversion          (Blank rows = not tested)  LOWER EXTREMITY MMT:  MMT Right eval Left eval  Hip flexion 4 4  Hip extension    Hip abduction supine 3+ 4  Hip adduction    Hip internal rotation    Hip external rotation    Knee flexion 4 5  Knee extension 4 5  Ankle dorsiflexion    Ankle plantarflexion    Ankle inversion    Ankle eversion     (Blank rows = not tested)  LOWER EXTREMITY SPECIAL TESTS:  Did not assess  FUNCTIONAL TESTS:  5x STS: 20.74 sec with UE  TUG: 39.38 sec  with RW   GAIT: Distance walked: Into clinic Assistive device utilized: Environmental consultant - 2 wheeled Level of assistance: Modified independence Comments: Antalgic, trunk flexed, decreased R hip ext  TODAY'S TREATMENT:  [facility] Adult PT Treatment:                                                DATE: 10/31/2023 Nustep L5 x 4 mins for ROM and functional activity tolerance Supine heel slide 2x10 - 5 hold R Supine QS with heel propped 2x10 - 5 hold R Supine SLR 2x8 R LAQ 3x10 2# R STS 2x10 - no UE Step ups 2x10 6in R leading 1 UE Knee flexion step stretch 2x10 5 hold 6in Standing TKE x 10 - 5 hold R  [facility] Adult PT Treatment:                                                DATE: 10/27/2023 Therapeutic Exercise: Rec bike 8' Heel slide on board with strap for overpressure, quad set at end of movement 1x20, hold 3s Therapeutic Activity: Standing hamstring curl 2x12 B, hold 2s, 2# cuff Standing marching 2x12,B 1s hold, 2# cuff Standing abduction 2x 12, hold 2s, 2# cuff Standing TKE with ball into wall 1x12, 8s  [facility] Adult PT Treatment:                                                DATE: 10/18/2023 Therapeutic Exercise: Nustep L4 x 8 mins moving forward  halfway through to promote knee flexion Seated heel slide with strap for overpression 2x10, 5 sec hold SAQ 2 x 10  Seated LAQ Seated heel slides with RTB for resistance  Sit<>stand  Updated and reviewed HEP   [facility] Adult PT Treatment:                                                DATE: 10/14/23 Therapeutic Exercise: Nustep L4 x 8 mins moving forward halfway through to promote knee flexion Seated heel slide foot on towel LLE assist 2x10 Seated knee flex/ext dangling/swinging 3x30 Seated LAQ eccentrics 2x10 Seated hip flexion 2x30 Seated quad set 3 hold 2x10 Seated heel/toe raise x30 Seated hamstring curl with RTB 2 x 10  Standing heel/toe raise 2x10 Sit<>stand 3x5 Modalities: Cold pack applied to right knee post session, patient  in supine with BIL LE on bolster x8 mins (not billed)  [facility] Adult PT Treatment:                                                DATE: 10/12/23 Therapeutic Exercise: Nustep L4 x 8 mins moving forward halfway through to promote knee flexion Seated knee flex/ext dangling/swinging 3x30 Seated LAQ eccentrics 2x10 Seated hip flexion 2x30 Seated quad set 3 hold 2x10 Seated heel/toe raise x20 Seated hamstring curl with RTB 2 x 10  Standing heel/toe raise 2x10 Sit<>stand 2x5 Tailgate stretch with distraction into flexion EOM, therapist resistance Therapeutic Activity: Gait with SPC, moving cane to LUE, sequencing and form Modalities: Cold pack applied to right knee post session, patient in supine with BIL LE on bolster x8 mins (not billed)  [facility] Adult PT Treatment:                                                DATE: 10/07/2023  Therapeutic Exercise: Nustep L4 x 8 mins moving forward halfway through to promote knee flexion Seated knee flex/ext dangling/swinging 2x30 Seated LAQ eccentrics 2x10 Seated hip flexion 2x30 Seated quad set 3 hold 2x10 Seated heel/toe raise 2 x 30  Seated hamstring curl with RTB 2 x 10  Sit<>stand 2x5 to RW - cues to push from table  Modalities: Cold pack applied to right knee post session, patient in supine with BIL LE on bolster x8 mins (not billed)    PATIENT EDUCATION:  Education details: HEP, self care, s/s of blood clots Person educated: Patient Education method: Explanation, Demonstration, and Handouts Education comprehension: verbalized understanding, returned demonstration, and needs further education  HOME EXERCISE PROGRAM: Access Code: [email or web address] URL: [email or web address] Date: 09/14/2023 Prepared by: Gellen April Marie Nonato  Exercises - Supine Heel Slide with Strap  - 1 x daily - 7 x weekly - 2 sets - 10 reps - Hooklying Bilateral Isometric Clamshell  - 1 x daily - 7 x weekly - 2 sets - 10 reps - Supine Bridge  - 1 x  daily - 7 x weekly - 2 sets - 10 reps - Supine Quad Set  - 1 x daily - 7 x weekly - 2 sets - 10 reps  ASSESSMENT:  CLINICAL IMPRESSION:  Pt was able to complete all prescribed exercises with no adverse effect. Exercises once again focused on R knee ROM, strength, and general functional mobility post TKA. Pt demonstrated improved ROM today in both directions. Continues to benefit from skilled PT services, will continue to be seen and progressed as able.   From eval: Patient is a 63 y.o. F who was seen today for physical therapy evaluation and treatment s/p R TKA on 08/29/23. Pt has received 1 week of home health and states she has been compliant with her HEP. Assessment indicates limited R knee ROM, decreased strength (especially in quad), and decreased balance affecting pt's safe mobility for home and in the community. TUG and 5x STS scores both indicate a high risk of falls. Pt will benefit from PT to improve on these issues, decrease pain and maximize level of function.   OBJECTIVE IMPAIRMENTS: Abnormal gait, decreased activity tolerance, decreased balance, decreased endurance, decreased mobility, difficulty walking, decreased ROM, decreased strength, hypomobility, increased fascial restrictions, increased muscle spasms, impaired flexibility, improper body mechanics, postural dysfunction, and pain.   ACTIVITY LIMITATIONS: carrying, lifting, bending, sitting, standing, squatting, sleeping, stairs, transfers, bed mobility, bathing, toileting, dressing, hygiene/grooming, and locomotion level  PARTICIPATION LIMITATIONS: meal prep, cleaning, laundry, driving, shopping, and community activity  PERSONAL FACTORS: Age, Fitness, Past/current experiences, and Time since onset of injury/illness/exacerbation are also affecting patient's functional outcome.   REHAB POTENTIAL: Good  CLINICAL DECISION MAKING: Evolving/moderate complexity  EVALUATION COMPLEXITY: Moderate   GOALS: Goals reviewed with patient?  Yes  SHORT TERM GOALS: Target date: 10/12/2023  Pt will be ind with initial HEP Baseline: Goal status: INITIAL  2.  Pt will have improved R knee ROM to >/=5-90 deg Baseline:  Goal status: MET 10/06/23: 5-92  3.  Pt will have improved 5x STS to </=15 sec to demo increasing functional LE strength Baseline:  Goal status: INITIAL    LONG TERM GOALS: Target date: 11/09/2023   Pt will be ind with management and progression of HEP Baseline:  Goal status: INITIAL  2.  Pt will have improved 5x STS to </=13 sec to be considered a low fall risk Baseline:  Goal status: INITIAL  3.  Pt will have improved TUG with LRAD to </=15 sec to demo decreased fall risk Baseline:  Goal status: INITIAL  4.  Pt will be able to amb limited community distances (at least 500') independently Baseline:  Goal status: INITIAL  5.  Pt will have improved LEFS to >/=25/80 to demo MCID for TKA Baseline: 16 Goal status: INITIAL  6.  Pt will have improved knee ROM to >/=5-110 deg to safely negotiate stairs within the community Baseline:  Goal status: INITIAL   PLAN:  PT FREQUENCY: 2x/week  PT DURATION: 8 weeks  PLANNED INTERVENTIONS: 97164- PT Re-evaluation, 97750- Physical Performance Testing, 97110-Therapeutic exercises, 97530- Therapeutic activity, 97112- Neuromuscular re-education, 97535- Self Care, [id number]- Manual therapy, [phone]- Gait training, [phone]- Aquatic Therapy, [phone]- Ultrasound, [phone] (1-2 muscles), 20561 (3+ muscles)- Dry Needling, Patient/Family education, Balance training, Stair training, Taping, Joint mobilization, Scar mobilization, Cryotherapy, and Moist heat   [employer] medicaid -- doesn't cover vaso, estim, ionto  PLAN FOR NEXT SESSION: Continue with therapeutic focus on current POC outline, progressing eccentric control for quad recruitment, AROM and WB tolerance to begin working towards higher quality gait with SPC.  Alm JAYSON Kingdom PT  10/31/23 1:07 PM

## 2023-11-04 ENCOUNTER — Other Ambulatory Visit (HOSPITAL_COMMUNITY): Payer: Self-pay

## 2023-11-04 ENCOUNTER — Other Ambulatory Visit: Payer: Self-pay | Admitting: Physician Assistant

## 2023-11-04 ENCOUNTER — Other Ambulatory Visit: Payer: Self-pay | Admitting: Physical Medicine and Rehabilitation

## 2023-11-04 ENCOUNTER — Other Ambulatory Visit: Payer: Self-pay | Admitting: Cardiovascular Disease

## 2023-11-04 ENCOUNTER — Other Ambulatory Visit: Payer: Self-pay

## 2023-11-04 MED ORDER — ONDANSETRON HCL 4 MG PO TABS
4.0000 mg | ORAL_TABLET | Freq: Three times a day (TID) | ORAL | 0 refills | Status: AC | PRN
  Filled 2023-11-04: qty 40, 14d supply, fill #0

## 2023-11-04 NOTE — Telephone Encounter (Signed)
 No need for refill from dvt prophylaxis standpoint

## 2023-11-07 ENCOUNTER — Encounter: Payer: Self-pay | Admitting: Physical Therapy

## 2023-11-07 ENCOUNTER — Ambulatory Visit: Admitting: Physical Therapy

## 2023-11-07 DIAGNOSIS — M25561 Pain in right knee: Secondary | ICD-10-CM

## 2023-11-07 DIAGNOSIS — R262 Difficulty in walking, not elsewhere classified: Secondary | ICD-10-CM

## 2023-11-07 DIAGNOSIS — M25661 Stiffness of right knee, not elsewhere classified: Secondary | ICD-10-CM

## 2023-11-07 DIAGNOSIS — M6281 Muscle weakness (generalized): Secondary | ICD-10-CM

## 2023-11-07 NOTE — Therapy (Signed)
 OUTPATIENT PHYSICAL THERAPY TREATMENT NOTE   Patient Name: [name] MRN: [id number] DOB:1960-02-14, 63 y.o., female Today's Date: 11/07/2023  END OF SESSION:  PT End of Session - 11/07/23 1352     Visit Number 12    Number of Visits 16    Date for Recertification  11/09/23    Authorization Type [employer]    Authorization Time Period Approved 13 visits 09/14/23-12/13/23    Authorization - Visit Number 11    Authorization - Number of Visits 13    PT Start Time 1400    PT Stop Time 1440    PT Time Calculation (min) 40 min    Activity Tolerance Patient tolerated treatment well    Behavior During Therapy WFL for tasks assessed/performed              Past Medical History:  Diagnosis Date   Anemia when she was young   Arthritis    Congestion of nasal sinus    Constipation    takes Colace every other day   DDD (degenerative disc disease), lumbar    Dependent edema    Dry eyes    uses Systane Eye drops daily as needed   GERD (gastroesophageal reflux disease) 12/06/2011   takes Nexium  daily   Headache(784.0)    occasionally-d/t congestion   History of bronchitis 6-7 yrs ago   History of kidney stones    Hyperlipidemia    Hypertension    takes Metoprolol  and Diovan  daily   Hypothyroidism 12/06/2011   takes Synthroid  daily   Joint pain    Multiple allergies    takes Zyrtec daily;uses Nasonex daily as needed   Nocturia    Numbness    weakness-right hand   Onychomycosis    OSA on CPAP    Wears CPAP Nightly   Pancreatitis    Peripheral edema    takes Furosemide  daily   Pre-diabetes    S/P insertion of spinal cord stimulator    Shingles    Urinary frequency    Venous insufficiency of leg    Past Surgical History:  Procedure Laterality Date   ABDOMINAL HYSTERECTOMY     uterine prolapse   BACK SURGERY     L4-5   CARDIAC CATHETERIZATION  2007   CATARACT EXTRACTION, BILATERAL  2023   CHOLECYSTECTOMY     COLONOSCOPY      ESOPHAGOGASTRODUODENOSCOPY  5-76yrs ago   FOOT SURGERY Bilateral    KNEE ARTHROSCOPY Right    LAMINECTOMY THORACIC SPINE W/ PLACEMENT SPINAL CORD STIMULATOR     LAPAROSCOPIC GASTRIC SLEEVE RESECTION N/A 08/23/2016   Procedure: LAPAROSCOPIC GASTRIC SLEEVE RESECTION WITH UPPER ENDO;  Surgeon: Gladis Cough, MD;  Location: [facility];  Service: General;  Laterality: N/A;   POSTERIOR LUMBAR FUSION  03/10/2009   L3-4 and L3-4 fusion, Dr. Catalina Stains   SHOULDER ARTHROSCOPY WITH ROTATOR CUFF REPAIR AND SUBACROMIAL DECOMPRESSION Right 08/23/2013   Procedure: RIGHT SHOULDER ARTHROSCOPY WITH  SUBACROMIAL DECOMPRESSION DISTAL CLAVICLE RESECTION AND  ROTATOR CUFF REPAIR ;  Surgeon: Franky CHRISTELLA Pointer, MD;  Location: [facility];  Service: Orthopedics;  Laterality: Right;   TOTAL KNEE ARTHROPLASTY Right 08/29/2023   Procedure: ARTHROPLASTY, KNEE, TOTAL;  Surgeon: Jerri Kay CHRISTELLA, MD;  Location: [facility];  Service: Orthopedics;  Laterality: Right;   TUBAL LIGATION     Patient Active Problem List   Diagnosis Date Noted   S/P TKR (total knee replacement), right 08/30/2023   Status post total right knee replacement 08/29/2023  Primary osteoarthritis of right knee 05/03/2023   Primary osteoarthritis of left knee 05/03/2023   Memory change 12/25/2020   Chronic pain syndrome 05/04/2019   Raynaud's disease 04/13/2019   Lumbar radiculopathy 12/18/2018   Spinal stenosis of lumbar region with neurogenic claudication 12/18/2018   Post laminectomy syndrome 12/18/2018   Bilateral primary osteoarthritis of knee 12/27/2016   History of excision of intestinal structure 09/07/2016   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 08/23/2016   Prediabetes 12/03/2015   OSA on CPAP 02/19/2015   Lung field abnormal 11/11/2014   Morbid obesity (HCC) 10/09/2014   Constipation 06/14/2014   Peripheral venous insufficiency 05/25/2013   Pulmonary HTN (HCC) 05/12/2013   Bilateral lower extremity edema 05/12/2013   Morbid obesity with BMI  of 50.0-59.9, adult (HCC)    Shoulder joint pain 03/28/2013   Abnormal glucose level 02/20/2013   Alopecia 02/20/2013   Degeneration of lumbar intervertebral disc 02/20/2013   Hyperglycemia due to type 2 diabetes mellitus (HCC) 02/20/2013   Type 2 diabetes mellitus with other specified complication (HCC) 02/20/2013   Back pain, hx of prior back surgery 12/09/2011   Pancreatitis, mild, CT negative 12/08/2011   Fatty liver 12/08/2011   Substernal chest pain, suspected secondary to acute pancreatitis 12/06/2011   Hypertension 12/06/2011   Hypokalemia 12/06/2011   Hypothyroidism 12/06/2011   Anxiety 12/06/2011   BV (bacterial vaginosis)    Hyperlipidemia     PCP: Larnell Hamilton, MD  REFERRING PROVIDER: Jerri Kay HERO, MD  REFERRING DIAG: [phone] (ICD-10-CM) - S/P total knee arthroplasty, right  THERAPY DIAG:  Acute pain of right knee  Stiffness of right knee, not elsewhere classified  Difficulty in walking, not elsewhere classified  Muscle weakness (generalized)  Rationale for Evaluation and Treatment: Rehabilitation  ONSET DATE: 08/29/23  SUBJECTIVE:   SUBJECTIVE STATEMENT: Pt attended today's session with reports of 1/10 pain. Pt stated that they have maintained good compliance with current HEP.  Has been able to walk around without a cane   From eval: Pt reports it's been hurting really bad. Pt's daughter and granddaughter are currently at home helping her. Pt reports she got 1 week of HHPT. Has been doing the exercises provided to her. Had been using the cane prior to surgery but left it in [place] . Has been using RW in the house currently.   PERTINENT HISTORY: Arthritis, DDD, HTN, spinal cord stimulator. Eval and treat right knee. S/p right TKA 08/29/2023  PAIN:  Are you having pain?  Yes: NPRS scale: 5/10 currently, can get up to a 10/10 Pain location: Front of R knee Pain description: Aching and throbbing Aggravating factors: Standing/walking, sometimes  sitting Relieving factors: Ice, pain medication  PRECAUTIONS: None  RED FLAGS: None   WEIGHT BEARING RESTRICTIONS: No  FALLS:  Has patient fallen in last 6 months? No  LIVING ENVIRONMENT: Lives with: granddaughter Lives in: House/apartment Stairs: No; 2 steps to porch Has following equipment at home: Vannie - 2 wheeled and bed side commode  OCCUPATION: Retired - likes to watch TV, go out to eat  PLOF: Independent  PATIENT GOALS: Wants to get off using any a/d  NEXT MD VISIT: 09/15/23  OBJECTIVE:  Note: Objective measures were completed at Evaluation unless otherwise noted.  DIAGNOSTIC FINDINGS: n/a  PATIENT SURVEYS:  Lower Extremity Functional Score: 16 / 80 = 20.0 %  COGNITION: Overall cognitive status: Within functional limits for tasks assessed     SENSATION: WFL no overt N/T  EDEMA:  Unable to accurately measure due  to prevena wound vac not properly sealing down  MUSCLE LENGTH: Hamstrings: see knee ROM Thomas test: did not assess  POSTURE: flexed trunk   PALPATION: TTP along R knee joint line  LOWER EXTREMITY ROM:  Active ROM Right eval Left eval Right 09/22/23 Right 10/06/23 Right 10/12/23 Right 10/18/23 Right 10/31/2023  Hip flexion         Hip extension         Hip abduction         Hip adduction         Hip internal rotation         Hip external rotation         Knee flexion 73 sitting 100 sitting 87 sitting 92 sitting  82 sitting EOM with OP 88 AROM supine beginning  106 AAROM  Knee extension -15 sitting -10 sitting -10 LAQ -8 quad set -5 quad set sitting   4  Ankle dorsiflexion         Ankle plantarflexion         Ankle inversion         Ankle eversion          (Blank rows = not tested)  LOWER EXTREMITY MMT:  MMT Right eval Left eval  Hip flexion 4 4  Hip extension    Hip abduction supine 3+ 4  Hip adduction    Hip internal rotation    Hip external rotation    Knee flexion 4 5  Knee extension 4 5  Ankle dorsiflexion     Ankle plantarflexion    Ankle inversion    Ankle eversion     (Blank rows = not tested)  LOWER EXTREMITY SPECIAL TESTS:  Did not assess  FUNCTIONAL TESTS:  5x STS: 20.74 sec with UE  TUG: 39.38 sec with RW   GAIT: Distance walked: Into clinic Assistive device utilized: Environmental consultant - 2 wheeled Level of assistance: Modified independence Comments: Antalgic, trunk flexed, decreased R hip ext  TODAY'S TREATMENT:  [facility] Adult PT Treatment:                                                DATE: 11/07/2023 Neuromuscular re-ed: NMES with quad set and SAQ Dual channel [place], 32 intensity, 10/10 Quad set, SAQ  Therapeutic Activity: Heel slide  2x20 on slide board  HEP modification, educaiton for resting knee extension     [facility] Adult PT Treatment:                                                DATE: 10/31/2023 Nustep L5 x 4 mins for ROM and functional activity tolerance Supine heel slide 2x10 - 5 hold R Supine QS with heel propped 2x10 - 5 hold R Supine SLR 2x8 R LAQ 3x10 2# R STS 2x10 - no UE Step ups 2x10 6in R leading 1 UE Knee flexion step stretch 2x10 5 hold 6in Standing TKE x 10 - 5 hold R  [facility] Adult PT Treatment:  DATE: 10/27/2023 Therapeutic Exercise: Rec bike 8' Heel slide on board with strap for overpressure, quad set at end of movement 1x20, hold 3s Therapeutic Activity: Standing hamstring curl 2x12 B, hold 2s, 2# cuff Standing marching 2x12,B 1s hold, 2# cuff Standing abduction 2x 12, hold 2s, 2# cuff Standing TKE with ball into wall 1x12, 8s       PATIENT EDUCATION:  Education details: HEP, self care, s/s of blood clots Person educated: Patient Education method: Explanation, Demonstration, and Handouts Education comprehension: verbalized understanding, returned demonstration, and needs further education  HOME EXERCISE PROGRAM: Access Code: [email or web address] URL: [email or web address] Date:  09/14/2023 Prepared by: Gellen April Earnie Starring  Exercises - Supine Heel Slide with Strap  - 1 x daily - 7 x weekly - 2 sets - 10 reps - Hooklying Bilateral Isometric Clamshell  - 1 x daily - 7 x weekly - 2 sets - 10 reps - Supine Bridge  - 1 x daily - 7 x weekly - 2 sets - 10 reps - Supine Quad Set  - 1 x daily - 7 x weekly - 2 sets - 10 reps  ASSESSMENT:  CLINICAL IMPRESSION: Pt attended physical therapy session for continuation of treatment regarding R TKA. Today's treatment focused on improvement of  quadriceps recruitment, proximal hip strengthening, and AROM. Pt demonstrated a high quality quad contraction following use of NMES. Pt AROM is 2-104 and 2-116 AAROM by end of session. Pt showed great tolerance to administered treatment with no adverse effects by the end of session. Skilled intervention was utilized via activity modification for pt tolerance with task completion, functional progression/regression promoting best outcomes inline with current rehab goals, as well as minmal verbal/tactile cuing alongside no physical assistance for safe and appropriate performance of today's activities. Continue with therapeutic focus on current POC outline.   From eval: Patient is a 62 y.o. F who was seen today for physical therapy evaluation and treatment s/p R TKA on 08/29/23. Pt has received 1 week of home health and states she has been compliant with her HEP. Assessment indicates limited R knee ROM, decreased strength (especially in quad), and decreased balance affecting pt's safe mobility for home and in the community. TUG and 5x STS scores both indicate a high risk of falls. Pt will benefit from PT to improve on these issues, decrease pain and maximize level of function.   OBJECTIVE IMPAIRMENTS: Abnormal gait, decreased activity tolerance, decreased balance, decreased endurance, decreased mobility, difficulty walking, decreased ROM, decreased strength, hypomobility, increased fascial restrictions,  increased muscle spasms, impaired flexibility, improper body mechanics, postural dysfunction, and pain.   ACTIVITY LIMITATIONS: carrying, lifting, bending, sitting, standing, squatting, sleeping, stairs, transfers, bed mobility, bathing, toileting, dressing, hygiene/grooming, and locomotion level  PARTICIPATION LIMITATIONS: meal prep, cleaning, laundry, driving, shopping, and community activity  PERSONAL FACTORS: Age, Fitness, Past/current experiences, and Time since onset of injury/illness/exacerbation are also affecting patient's functional outcome.   REHAB POTENTIAL: Good  CLINICAL DECISION MAKING: Evolving/moderate complexity  EVALUATION COMPLEXITY: Moderate   GOALS: Goals reviewed with patient? Yes  SHORT TERM GOALS: Target date: 10/12/2023  Pt will be ind with initial HEP Baseline: Goal status: INITIAL  2.  Pt will have improved R knee ROM to >/=5-90 deg Baseline:  Goal status: MET 10/06/23: 5-92  3.  Pt will have improved 5x STS to </=15 sec to demo increasing functional LE strength Baseline:  Goal status: INITIAL    LONG TERM GOALS: Target date: 11/09/2023   Pt will be ind with management and  progression of HEP Baseline:  Goal status: INITIAL  2.  Pt will have improved 5x STS to </=13 sec to be considered a low fall risk Baseline:  Goal status: INITIAL  3.  Pt will have improved TUG with LRAD to </=15 sec to demo decreased fall risk Baseline:  Goal status: INITIAL  4.  Pt will be able to amb limited community distances (at least 500') independently Baseline:  Goal status: INITIAL  5.  Pt will have improved LEFS to >/=25/80 to demo MCID for TKA Baseline: 16 Goal status: INITIAL  6.  Pt will have improved knee ROM to >/=5-110 deg to safely negotiate stairs within the community Baseline:  Goal status: INITIAL   PLAN:  PT FREQUENCY: 2x/week  PT DURATION: 8 weeks  PLANNED INTERVENTIONS: 97164- PT Re-evaluation, 97750- Physical Performance  Testing, 97110-Therapeutic exercises, 97530- Therapeutic activity, 97112- Neuromuscular re-education, 97535- Self Care, [id number]- Manual therapy, [phone]- Gait training, [phone]- Aquatic Therapy, [phone]- Ultrasound, [phone] (1-2 muscles), [phone] (3+ muscles)- Dry Needling, Patient/Family education, Balance training, Stair training, Taping, Joint mobilization, Scar mobilization, Cryotherapy, and Moist heat   [employer] medicaid -- doesn't cover vaso, estim, ionto  PLAN FOR NEXT SESSION: Continue with therapeutic focus on current POC outline, progressing eccentric control for quad recruitment, AROM and WB tolerance to begin working towards higher quality gait with SPC.  Mabel Kiang, PT, DPT 11/07/2023, 2:43 PM

## 2023-11-08 ENCOUNTER — Other Ambulatory Visit (HOSPITAL_COMMUNITY): Payer: Self-pay

## 2023-11-08 MED ORDER — METOPROLOL TARTRATE 25 MG PO TABS
25.0000 mg | ORAL_TABLET | Freq: Two times a day (BID) | ORAL | 0 refills | Status: AC
  Filled 2023-11-08: qty 60, 30d supply, fill #0

## 2023-11-09 ENCOUNTER — Other Ambulatory Visit (HOSPITAL_COMMUNITY): Payer: Self-pay

## 2023-11-10 ENCOUNTER — Encounter: Attending: Psychology | Admitting: Psychology

## 2023-11-10 ENCOUNTER — Encounter: Payer: Self-pay | Admitting: Physical Therapy

## 2023-11-10 ENCOUNTER — Ambulatory Visit: Admitting: Physical Therapy

## 2023-11-10 DIAGNOSIS — R262 Difficulty in walking, not elsewhere classified: Secondary | ICD-10-CM

## 2023-11-10 DIAGNOSIS — G4733 Obstructive sleep apnea (adult) (pediatric): Secondary | ICD-10-CM | POA: Diagnosis present

## 2023-11-10 DIAGNOSIS — G309 Alzheimer's disease, unspecified: Secondary | ICD-10-CM | POA: Insufficient documentation

## 2023-11-10 DIAGNOSIS — M25561 Pain in right knee: Secondary | ICD-10-CM

## 2023-11-10 DIAGNOSIS — G894 Chronic pain syndrome: Secondary | ICD-10-CM | POA: Insufficient documentation

## 2023-11-10 DIAGNOSIS — M6281 Muscle weakness (generalized): Secondary | ICD-10-CM

## 2023-11-10 DIAGNOSIS — F0280 Dementia in other diseases classified elsewhere without behavioral disturbance: Secondary | ICD-10-CM | POA: Diagnosis present

## 2023-11-10 DIAGNOSIS — M25661 Stiffness of right knee, not elsewhere classified: Secondary | ICD-10-CM

## 2023-11-10 DIAGNOSIS — R413 Other amnesia: Secondary | ICD-10-CM | POA: Diagnosis present

## 2023-11-10 NOTE — Therapy (Signed)
 OUTPATIENT PHYSICAL THERAPY TREATMENT NOTE   Patient Name: [name] MRN: [id number] DOB:November 17, 1960, 63 y.o., female Today's Date: 11/10/2023  END OF SESSION:  PT End of Session - 11/10/23 1203     Visit Number 13    Number of Visits 16    Date for Recertification  11/09/23    Authorization Time Period Approved 13 visits 09/14/23-12/13/23    Authorization - Visit Number 12    Authorization - Number of Visits 13    PT Start Time 1155    PT Stop Time 1235    PT Time Calculation (min) 40 min    Activity Tolerance Patient tolerated treatment well    Behavior During Therapy WFL for tasks assessed/performed               Past Medical History:  Diagnosis Date   Anemia when she was young   Arthritis    Congestion of nasal sinus    Constipation    takes Colace every other day   DDD (degenerative disc disease), lumbar    Dependent edema    Dry eyes    uses Systane Eye drops daily as needed   GERD (gastroesophageal reflux disease) 12/06/2011   takes Nexium  daily   Headache(784.0)    occasionally-d/t congestion   History of bronchitis 6-7 yrs ago   History of kidney stones    Hyperlipidemia    Hypertension    takes Metoprolol  and Diovan  daily   Hypothyroidism 12/06/2011   takes Synthroid  daily   Joint pain    Multiple allergies    takes Zyrtec daily;uses Nasonex daily as needed   Nocturia    Numbness    weakness-right hand   Onychomycosis    OSA on CPAP    Wears CPAP Nightly   Pancreatitis    Peripheral edema    takes Furosemide  daily   Pre-diabetes    S/P insertion of spinal cord stimulator    Shingles    Urinary frequency    Venous insufficiency of leg    Past Surgical History:  Procedure Laterality Date   ABDOMINAL HYSTERECTOMY     uterine prolapse   BACK SURGERY     L4-5   CARDIAC CATHETERIZATION  2007   CATARACT EXTRACTION, BILATERAL  2023   CHOLECYSTECTOMY     COLONOSCOPY     ESOPHAGOGASTRODUODENOSCOPY  5-62yrs ago   FOOT SURGERY  Bilateral    KNEE ARTHROSCOPY Right    LAMINECTOMY THORACIC SPINE W/ PLACEMENT SPINAL CORD STIMULATOR     LAPAROSCOPIC GASTRIC SLEEVE RESECTION N/A 08/23/2016   Procedure: LAPAROSCOPIC GASTRIC SLEEVE RESECTION WITH UPPER ENDO;  Surgeon: Gladis Cough, MD;  Location: [facility];  Service: General;  Laterality: N/A;   POSTERIOR LUMBAR FUSION  03/10/2009   L3-4 and L3-4 fusion, Dr. Catalina Stains   SHOULDER ARTHROSCOPY WITH ROTATOR CUFF REPAIR AND SUBACROMIAL DECOMPRESSION Right 08/23/2013   Procedure: RIGHT SHOULDER ARTHROSCOPY WITH  SUBACROMIAL DECOMPRESSION DISTAL CLAVICLE RESECTION AND  ROTATOR CUFF REPAIR ;  Surgeon: Franky CHRISTELLA Pointer, MD;  Location: [facility];  Service: Orthopedics;  Laterality: Right;   TOTAL KNEE ARTHROPLASTY Right 08/29/2023   Procedure: ARTHROPLASTY, KNEE, TOTAL;  Surgeon: Jerri Kay CHRISTELLA, MD;  Location: [facility];  Service: Orthopedics;  Laterality: Right;   TUBAL LIGATION     Patient Active Problem List   Diagnosis Date Noted   S/P TKR (total knee replacement), right 08/30/2023   Status post total right knee replacement 08/29/2023   Primary osteoarthritis of right knee 05/03/2023  Primary osteoarthritis of left knee 05/03/2023   Memory change 12/25/2020   Chronic pain syndrome 05/04/2019   Raynaud's disease 04/13/2019   Lumbar radiculopathy 12/18/2018   Spinal stenosis of lumbar region with neurogenic claudication 12/18/2018   Post laminectomy syndrome 12/18/2018   Bilateral primary osteoarthritis of knee 12/27/2016   History of excision of intestinal structure 09/07/2016   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 08/23/2016   Prediabetes 12/03/2015   OSA on CPAP 02/19/2015   Lung field abnormal 11/11/2014   Morbid obesity (HCC) 10/09/2014   Constipation 06/14/2014   Peripheral venous insufficiency 05/25/2013   Pulmonary HTN (HCC) 05/12/2013   Bilateral lower extremity edema 05/12/2013   Morbid obesity with BMI of 50.0-59.9, adult (HCC)    Shoulder joint pain  03/28/2013   Abnormal glucose level 02/20/2013   Alopecia 02/20/2013   Degeneration of lumbar intervertebral disc 02/20/2013   Hyperglycemia due to type 2 diabetes mellitus (HCC) 02/20/2013   Type 2 diabetes mellitus with other specified complication (HCC) 02/20/2013   Back pain, hx of prior back surgery 12/09/2011   Pancreatitis, mild, CT negative 12/08/2011   Fatty liver 12/08/2011   Substernal chest pain, suspected secondary to acute pancreatitis 12/06/2011   Hypertension 12/06/2011   Hypokalemia 12/06/2011   Hypothyroidism 12/06/2011   Anxiety 12/06/2011   BV (bacterial vaginosis)    Hyperlipidemia     PCP: Larnell Hamilton, MD  REFERRING PROVIDER: Jerri Kay HERO, MD  REFERRING DIAG: [phone] (ICD-10-CM) - S/P total knee arthroplasty, right  THERAPY DIAG:  Stiffness of right knee, not elsewhere classified  Acute pain of right knee  Difficulty in walking, not elsewhere classified  Muscle weakness (generalized)  Rationale for Evaluation and Treatment: Rehabilitation  ONSET DATE: 08/29/23  SUBJECTIVE:   SUBJECTIVE STATEMENT: Pt attended today's session with reports of 0/10 pain. Pt stated that they have maintained good compliance with current HEP.  Has been able to walk around without a cane   From eval: Pt reports it's been hurting really bad. Pt's daughter and granddaughter are currently at home helping her. Pt reports she got 1 week of HHPT. Has been doing the exercises provided to her. Had been using the cane prior to surgery but left it in [place] . Has been using RW in the house currently.   PERTINENT HISTORY: Arthritis, DDD, HTN, spinal cord stimulator. Eval and treat right knee. S/p right TKA 08/29/2023  PAIN:  Are you having pain?  Yes: NPRS scale: 5/10 currently, can get up to a 10/10 Pain location: Front of R knee Pain description: Aching and throbbing Aggravating factors: Standing/walking, sometimes sitting Relieving factors: Ice, pain  medication  PRECAUTIONS: None  RED FLAGS: None   WEIGHT BEARING RESTRICTIONS: No  FALLS:  Has patient fallen in last 6 months? No  LIVING ENVIRONMENT: Lives with: granddaughter Lives in: House/apartment Stairs: No; 2 steps to porch Has following equipment at home: Vannie - 2 wheeled and bed side commode  OCCUPATION: Retired - likes to watch TV, go out to eat  PLOF: Independent  PATIENT GOALS: Wants to get off using any a/d  NEXT MD VISIT: 09/15/23  OBJECTIVE:  Note: Objective measures were completed at Evaluation unless otherwise noted.  DIAGNOSTIC FINDINGS: n/a  PATIENT SURVEYS:  Lower Extremity Functional Score: 16 / 80 = 20.0 %  COGNITION: Overall cognitive status: Within functional limits for tasks assessed     SENSATION: WFL no overt N/T  EDEMA:  Unable to accurately measure due to prevena wound vac not properly sealing down  MUSCLE LENGTH: Hamstrings: see knee ROM Thomas test: did not assess  POSTURE: flexed trunk   PALPATION: TTP along R knee joint line  LOWER EXTREMITY ROM:  Active ROM Right eval Left eval Right 09/22/23 Right 10/06/23 Right 10/12/23 Right 10/18/23 Right 10/31/2023  Hip flexion         Hip extension         Hip abduction         Hip adduction         Hip internal rotation         Hip external rotation         Knee flexion 73 sitting 100 sitting 87 sitting 92 sitting  82 sitting EOM with OP 88 AROM supine beginning  106 AAROM  Knee extension -15 sitting -10 sitting -10 LAQ -8 quad set -5 quad set sitting   4  Ankle dorsiflexion         Ankle plantarflexion         Ankle inversion         Ankle eversion          (Blank rows = not tested)  LOWER EXTREMITY MMT:  MMT Right eval Left eval  Hip flexion 4 4  Hip extension    Hip abduction supine 3+ 4  Hip adduction    Hip internal rotation    Hip external rotation    Knee flexion 4 5  Knee extension 4 5  Ankle dorsiflexion    Ankle plantarflexion    Ankle  inversion    Ankle eversion     (Blank rows = not tested)  LOWER EXTREMITY SPECIAL TESTS:  Did not assess  FUNCTIONAL TESTS:  5x STS: 20.74 sec with UE  TUG: 39.38 sec with RW   GAIT: Distance walked: Into clinic Assistive device utilized: Environmental consultant - 2 wheeled Level of assistance: Modified independence Comments: Antalgic, trunk flexed, decreased R hip ext  TODAY'S TREATMENT:   [facility] Adult PT Treatment:                                                DATE: 11/10/2023 Therapeutic Exercise: Hee slide on board, strap for overpressure 2x20, hold 3s Neuromuscular re-ed: Standing TKE against RTB 2x8, 8s hold Supine QS 1x30, 3s hold Therapeutic Activity: Rec Bike 8' Heel raise on slant board 2x15, Ue a. PRN   [facility] Adult PT Treatment:                                                DATE: 11/07/2023 Neuromuscular re-ed: NMES with quad set and SAQ Dual channel [place], 32 intensity, 10/10 Quad set, SAQ  Therapeutic Activity: Heel slide  2x20 on slide board  HEP modification, educaiton for resting knee extension     [facility] Adult PT Treatment:                                                DATE: 10/31/2023 Nustep L5 x 4 mins for ROM and functional activity tolerance Supine heel slide 2x10 - 5 hold R Supine QS with heel propped 2x10 -  5 hold R Supine SLR 2x8 R LAQ 3x10 2# R STS 2x10 - no UE Step ups 2x10 6in R leading 1 UE Knee flexion step stretch 2x10 5 hold 6in Standing TKE x 10 - 5 hold R  [facility] Adult PT Treatment:                                                DATE: 10/27/2023 Therapeutic Exercise: Rec bike 8' Heel slide on board with strap for overpressure, quad set at end of movement 1x20, hold 3s Therapeutic Activity: Standing hamstring curl 2x12 B, hold 2s, 2# cuff Standing marching 2x12,B 1s hold, 2# cuff Standing abduction 2x 12, hold 2s, 2# cuff Standing TKE with ball into wall 1x12, 8s       PATIENT EDUCATION:  Education details: HEP, self  care, s/s of blood clots Person educated: Patient Education method: Explanation, Demonstration, and Handouts Education comprehension: verbalized understanding, returned demonstration, and needs further education  HOME EXERCISE PROGRAM: Access Code: [email or web address] URL: [email or web address] Date: 09/14/2023 Prepared by: Gellen April Earnie Starring  Exercises - Supine Heel Slide with Strap  - 1 x daily - 7 x weekly - 2 sets - 10 reps - Hooklying Bilateral Isometric Clamshell  - 1 x daily - 7 x weekly - 2 sets - 10 reps - Supine Bridge  - 1 x daily - 7 x weekly - 2 sets - 10 reps - Supine Quad Set  - 1 x daily - 7 x weekly - 2 sets - 10 reps  ASSESSMENT:  CLINICAL IMPRESSION: Pt attended physical therapy session for continuation of treatment regarding R TKA. Today's treatment focused on improvement of  quadriceps recruitment, proximal hip strengthening, and AROM. At rest pt creports minimal to no pain, however upwards off 5-7/10 with all activity during session. Pt AROM is 6-104 and 2-116 AAROM by end of session. Pt showed great tolerance to administered treatment with no adverse effects by the end of session. Skilled intervention was utilized via activity modification for pt tolerance with task completion, functional progression/regression promoting best outcomes inline with current rehab goals, as well as minmal verbal/tactile cuing alongside no physical assistance for safe and appropriate performance of today's activities. Continue with therapeutic focus on current POC outline.   From eval: Patient is a 62 y.o. F who was seen today for physical therapy evaluation and treatment s/p R TKA on 08/29/23. Pt has received 1 week of home health and states she has been compliant with her HEP. Assessment indicates limited R knee ROM, decreased strength (especially in quad), and decreased balance affecting pt's safe mobility for home and in the community. TUG and 5x STS scores both indicate a high  risk of falls. Pt will benefit from PT to improve on these issues, decrease pain and maximize level of function.   OBJECTIVE IMPAIRMENTS: Abnormal gait, decreased activity tolerance, decreased balance, decreased endurance, decreased mobility, difficulty walking, decreased ROM, decreased strength, hypomobility, increased fascial restrictions, increased muscle spasms, impaired flexibility, improper body mechanics, postural dysfunction, and pain.   ACTIVITY LIMITATIONS: carrying, lifting, bending, sitting, standing, squatting, sleeping, stairs, transfers, bed mobility, bathing, toileting, dressing, hygiene/grooming, and locomotion level  PARTICIPATION LIMITATIONS: meal prep, cleaning, laundry, driving, shopping, and community activity  PERSONAL FACTORS: Age, Fitness, Past/current experiences, and Time since onset of injury/illness/exacerbation are also affecting patient's functional outcome.   REHAB POTENTIAL: Good  CLINICAL DECISION MAKING: Evolving/moderate complexity  EVALUATION COMPLEXITY: Moderate   GOALS: Goals reviewed with patient? Yes  SHORT TERM GOALS: Target date: 10/12/2023  Pt will be ind with initial HEP Baseline: Goal status: INITIAL  2.  Pt will have improved R knee ROM to >/=5-90 deg Baseline:  Goal status: MET 10/06/23: 5-92  3.  Pt will have improved 5x STS to </=15 sec to demo increasing functional LE strength Baseline:  Goal status: INITIAL    LONG TERM GOALS: Target date: 11/09/2023   Pt will be ind with management and progression of HEP Baseline:  Goal status: INITIAL  2.  Pt will have improved 5x STS to </=13 sec to be considered a low fall risk Baseline:  Goal status: INITIAL  3.  Pt will have improved TUG with LRAD to </=15 sec to demo decreased fall risk Baseline:  Goal status: INITIAL  4.  Pt will be able to amb limited community distances (at least 500') independently Baseline:  Goal status: INITIAL  5.  Pt will have improved LEFS to  >/=25/80 to demo MCID for TKA Baseline: 16 Goal status: INITIAL  6.  Pt will have improved knee ROM to >/=5-110 deg to safely negotiate stairs within the community Baseline:  Goal status: INITIAL   PLAN:  PT FREQUENCY: 2x/week  PT DURATION: 8 weeks  PLANNED INTERVENTIONS: 97164- PT Re-evaluation, 97750- Physical Performance Testing, 97110-Therapeutic exercises, 97530- Therapeutic activity, 97112- Neuromuscular re-education, 97535- Self Care, [id number]- Manual therapy, [phone]- Gait training, [phone]- Aquatic Therapy, [phone]- Ultrasound, [phone] (1-2 muscles), [phone] (3+ muscles)- Dry Needling, Patient/Family education, Balance training, Stair training, Taping, Joint mobilization, Scar mobilization, Cryotherapy, and Moist heat   Healthy blue medicaid -- doesn't cover vaso, estim, ionto  PLAN FOR NEXT SESSION: Continue with therapeutic focus on current POC outline, progressing eccentric control for quad recruitment, AROM and WB tolerance to begin working towards higher quality gait with SPC.  Mabel Kiang, PT, DPT 11/10/2023, 12:35 PM

## 2023-11-15 ENCOUNTER — Encounter: Payer: Self-pay | Admitting: Physical Therapy

## 2023-11-15 ENCOUNTER — Ambulatory Visit: Admitting: Physical Therapy

## 2023-11-15 DIAGNOSIS — M6281 Muscle weakness (generalized): Secondary | ICD-10-CM

## 2023-11-15 DIAGNOSIS — R262 Difficulty in walking, not elsewhere classified: Secondary | ICD-10-CM

## 2023-11-15 DIAGNOSIS — M25661 Stiffness of right knee, not elsewhere classified: Secondary | ICD-10-CM

## 2023-11-15 DIAGNOSIS — M25561 Pain in right knee: Secondary | ICD-10-CM | POA: Diagnosis not present

## 2023-11-15 NOTE — Therapy (Signed)
 OUTPATIENT PHYSICAL THERAPY TREATMENT/RE-CERT NOTE   Patient Name: [name] MRN: [id number] DOB:1960-12-10, 63 y.o., female Today's Date: 11/15/2023  END OF SESSION:  PT End of Session - 11/15/23 1107     Visit Number 14    Number of Visits 16    Date for Recertification  11/09/23    Authorization Type [employer]    Authorization Time Period Approved 13 visits 09/14/23-12/13/23    Authorization - Visit Number 13    Authorization - Number of Visits 13    Activity Tolerance Patient tolerated treatment well    Behavior During Therapy [facility] for tasks assessed/performed               Past Medical History:  Diagnosis Date   Anemia when she was young   Arthritis    Congestion of nasal sinus    Constipation    takes Colace every other day   DDD (degenerative disc disease), lumbar    Dependent edema    Dry eyes    uses Systane Eye drops daily as needed   GERD (gastroesophageal reflux disease) 12/06/2011   takes Nexium  daily   Headache(784.0)    occasionally-d/t congestion   History of bronchitis 6-7 yrs ago   History of kidney stones    Hyperlipidemia    Hypertension    takes Metoprolol  and Diovan  daily   Hypothyroidism 12/06/2011   takes Synthroid  daily   Joint pain    Multiple allergies    takes Zyrtec daily;uses Nasonex daily as needed   Nocturia    Numbness    weakness-right hand   Onychomycosis    OSA on CPAP    Wears CPAP Nightly   Pancreatitis    Peripheral edema    takes Furosemide  daily   Pre-diabetes    S/P insertion of spinal cord stimulator    Shingles    Urinary frequency    Venous insufficiency of leg    Past Surgical History:  Procedure Laterality Date   ABDOMINAL HYSTERECTOMY     uterine prolapse   BACK SURGERY     L4-5   CARDIAC CATHETERIZATION  2007   CATARACT EXTRACTION, BILATERAL  2023   CHOLECYSTECTOMY     COLONOSCOPY     ESOPHAGOGASTRODUODENOSCOPY  5-64yrs ago   FOOT SURGERY Bilateral    KNEE ARTHROSCOPY  Right    LAMINECTOMY THORACIC SPINE W/ PLACEMENT SPINAL CORD STIMULATOR     LAPAROSCOPIC GASTRIC SLEEVE RESECTION N/A 08/23/2016   Procedure: LAPAROSCOPIC GASTRIC SLEEVE RESECTION WITH UPPER ENDO;  Surgeon: Gladis Cough, MD;  Location: [facility];  Service: General;  Laterality: N/A;   POSTERIOR LUMBAR FUSION  03/10/2009   L3-4 and L3-4 fusion, Dr. Catalina Stains   SHOULDER ARTHROSCOPY WITH ROTATOR CUFF REPAIR AND SUBACROMIAL DECOMPRESSION Right 08/23/2013   Procedure: RIGHT SHOULDER ARTHROSCOPY WITH  SUBACROMIAL DECOMPRESSION DISTAL CLAVICLE RESECTION AND  ROTATOR CUFF REPAIR ;  Surgeon: Franky CHRISTELLA Pointer, MD;  Location: [facility] OR;  Service: Orthopedics;  Laterality: Right;   TOTAL KNEE ARTHROPLASTY Right 08/29/2023   Procedure: ARTHROPLASTY, KNEE, TOTAL;  Surgeon: Jerri Kay CHRISTELLA, MD;  Location: [facility] OR;  Service: Orthopedics;  Laterality: Right;   TUBAL LIGATION     Patient Active Problem List   Diagnosis Date Noted   S/P TKR (total knee replacement), right 08/30/2023   Status post total right knee replacement 08/29/2023   Primary osteoarthritis of right knee 05/03/2023   Primary osteoarthritis of left knee 05/03/2023   Memory change 12/25/2020   Chronic  pain syndrome 05/04/2019   Raynaud's disease 04/13/2019   Lumbar radiculopathy 12/18/2018   Spinal stenosis of lumbar region with neurogenic claudication 12/18/2018   Post laminectomy syndrome 12/18/2018   Bilateral primary osteoarthritis of knee 12/27/2016   History of excision of intestinal structure 09/07/2016   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 08/23/2016   Prediabetes 12/03/2015   OSA on CPAP 02/19/2015   Lung field abnormal 11/11/2014   Morbid obesity (HCC) 10/09/2014   Constipation 06/14/2014   Peripheral venous insufficiency 05/25/2013   Pulmonary HTN (HCC) 05/12/2013   Bilateral lower extremity edema 05/12/2013   Morbid obesity with BMI of 50.0-59.9, adult (HCC)    Shoulder joint pain 03/28/2013   Abnormal glucose level  02/20/2013   Alopecia 02/20/2013   Degeneration of lumbar intervertebral disc 02/20/2013   Hyperglycemia due to type 2 diabetes mellitus (HCC) 02/20/2013   Type 2 diabetes mellitus with other specified complication (HCC) 02/20/2013   Back pain, hx of prior back surgery 12/09/2011   Pancreatitis, mild, CT negative 12/08/2011   Fatty liver 12/08/2011   Substernal chest pain, suspected secondary to acute pancreatitis 12/06/2011   Hypertension 12/06/2011   Hypokalemia 12/06/2011   Hypothyroidism 12/06/2011   Anxiety 12/06/2011   BV (bacterial vaginosis)    Hyperlipidemia     PCP: Larnell Hamilton, MD  REFERRING PROVIDER: Jerri Kay HERO, MD  REFERRING DIAG: [phone] (ICD-10-CM) - S/P total knee arthroplasty, right  THERAPY DIAG:  Stiffness of right knee, not elsewhere classified  Acute pain of right knee  Difficulty in walking, not elsewhere classified  Muscle weakness (generalized)  Rationale for Evaluation and Treatment: Rehabilitation  ONSET DATE: 08/29/23  SUBJECTIVE:   SUBJECTIVE STATEMENT: Pt states things are going better. Pt states she has been walking without the cane for a few weeks now without any issues.    From eval: Pt reports it's been hurting really bad. Pt's daughter and granddaughter are currently at home helping her. Pt reports she got 1 week of HHPT. Has been doing the exercises provided to her. Had been using the cane prior to surgery but left it in [place] . Has been using RW in the house currently.   PERTINENT HISTORY: Arthritis, DDD, HTN, spinal cord stimulator. Eval and treat right knee. S/p right TKA 08/29/2023  PAIN:  Are you having pain?  Yes: NPRS scale: 2/10 currently, at worst 2/10 within the past week Pain location: Front of R knee Pain description: Aching and throbbing Aggravating factors: Standing/walking, sometimes sitting Relieving factors: Ice, pain medication  PRECAUTIONS: None  RED FLAGS: None   WEIGHT BEARING RESTRICTIONS:  No  FALLS:  Has patient fallen in last 6 months? No  LIVING ENVIRONMENT: Lives with: granddaughter Lives in: House/apartment Stairs: No; 2 steps to porch Has following equipment at home: Vannie - 2 wheeled and bed side commode  OCCUPATION: Retired - likes to watch TV, go out to eat  PLOF: Independent  PATIENT GOALS: Wants to get off using any a/d  NEXT MD VISIT: 09/15/23  OBJECTIVE:  Note: Objective measures were completed at Evaluation unless otherwise noted.  DIAGNOSTIC FINDINGS: n/a  PATIENT SURVEYS:  Lower Extremity Functional Score: 16 / 80 = 20.0 % Lower Extremity Functional Score: 40 / 80 = 50.0 %  COGNITION: Overall cognitive status: Within functional limits for tasks assessed     SENSATION: WFL no overt N/T  EDEMA:  Unable to accurately measure due to prevena wound vac not properly sealing down  MUSCLE LENGTH: Hamstrings: see knee ROM Thomas test:  did not assess  POSTURE: flexed trunk   PALPATION: TTP along R knee joint line  LOWER EXTREMITY ROM:  Active ROM Right eval Left eval Right 09/22/23 Right 10/06/23 Right 10/12/23 Right 10/18/23 Right 10/31/2023 Right 11/15/23  Hip flexion          Hip extension          Hip abduction          Hip adduction          Hip internal rotation          Hip external rotation          Knee flexion 73 sitting 100 sitting 87 sitting 92 sitting  82 sitting EOM with OP 88 AROM supine beginning  106 AAROM 105 AROM sitting  Knee extension -15 sitting -10 sitting -10 LAQ -8 quad set -5 quad set sitting   4 -5 quad set sitting 0 PROM  Ankle dorsiflexion          Ankle plantarflexion          Ankle inversion          Ankle eversion           (Blank rows = not tested)  LOWER EXTREMITY MMT:  MMT Right eval Left eval  Hip flexion 4 4  Hip extension    Hip abduction supine 3+ 4  Hip adduction    Hip internal rotation    Hip external rotation    Knee flexion 4 5  Knee extension 4 5  Ankle dorsiflexion     Ankle plantarflexion    Ankle inversion    Ankle eversion     (Blank rows = not tested)  LOWER EXTREMITY SPECIAL TESTS:  Did not assess  FUNCTIONAL TESTS:  Eval 5x STS: 20.74 sec with UE  TUG: 39.38 sec with RW  Re-cert 89/78/74  5x STS: 21.84 sec without UE, 18.42 sec with UE  TUG: 13.57 Sec without a/d  GAIT: Distance walked: Into clinic Assistive device utilized: Environmental consultant - 2 wheeled Level of assistance: Modified independence Comments: Antalgic, trunk flexed, decreased R hip ext  TODAY'S TREATMENT:  [facility] Adult PT Treatment:                                                DATE: 11/15/2023 Therapeutic Exercise: Recumbent bike L1 x 6 min Sitting LAQ green TB 10 x3 sec Sitting hamstring curl green TB 10 x3 sec Therapeutic Activity: Rechecked goals (see below) Sit<>stand 3x5 Staggered st<>stand x10   [facility] Adult PT Treatment:                                                DATE: 11/10/2023 Therapeutic Exercise: Hee slide on board, strap for overpressure 2x20, hold 3s Neuromuscular re-ed: Standing TKE against RTB 2x8, 8s hold Supine QS 1x30, 3s hold Therapeutic Activity: Rec Bike 8' Heel raise on slant board 2x15, Ue a. PRN   [facility] Adult PT Treatment:  DATE: 11/07/2023 Neuromuscular re-ed: NMES with quad set and SAQ Dual channel [place], 32 intensity, 10/10 Quad set, SAQ  Therapeutic Activity: Heel slide  2x20 on slide board  HEP modification, educaiton for resting knee extension     [facility] Adult PT Treatment:                                                DATE: 10/31/2023 Nustep L5 x 4 mins for ROM and functional activity tolerance Supine heel slide 2x10 - 5 hold R Supine QS with heel propped 2x10 - 5 hold R Supine SLR 2x8 R LAQ 3x10 2# R STS 2x10 - no UE Step ups 2x10 6in R leading 1 UE Knee flexion step stretch 2x10 5 hold 6in Standing TKE x 10 - 5 hold R       PATIENT EDUCATION:  Education  details: HEP updates, reassessment findings/goal status Person educated: Patient Education method: Explanation, Demonstration, and Handouts Education comprehension: verbalized understanding, returned demonstration, and needs further education  HOME EXERCISE PROGRAM: Access Code: [email or web address] URL: [email or web address] Date: 11/15/2023 Prepared by: Qamar Aughenbaugh April Earnie Starring  Exercises - Seated Knee Extension with Resistance  - 1 x daily - 7 x weekly - 2 sets - 10 reps - 3 sec hold - Seated Hamstring Curls with Resistance  - 1 x daily - 7 x weekly - 2 sets - 10 reps - 3 sec hold - Staggered Sit-to-Stand  - 1 x daily - 7 x weekly - 2 sets - 10 reps - 3 sec hold - Seated Knee Extension Stretch With Overpressure  - 1 x daily - 7 x weekly - 1 sets - 10 reps - 5 sec hold - Seated Knee Flexion AAROM  - 1 x daily - 7 x weekly - 1 sets - 10 reps - 5 sec hold  ASSESSMENT:  CLINICAL IMPRESSION: Ms. Evony continues to progress well towards her goals -- she has met 4 out of the 6. The 2 goals she has not fully met are her ROM and strength based on her 5x STS. Although she has R = L knee ROM, she has potential to maximize motion for improved stair ascent/descent in the community. Additionally, although she has not fully met her 5x STS, it has slightly improved and she is now capable of performing them without UE assist. Her amb has otherwise greatly improved and she is able to amb community distances without a/d. Ms. Jone will benefit from a few more weeks of PT at a reduced frequency to fully meet the rest of her goals. Updated pt's HEP accordingly to work on these continued deficits.    From eval: Patient is a 63 y.o. F who was seen today for physical therapy evaluation and treatment s/p R TKA on 08/29/23. Pt has received 1 week of home health and states she has been compliant with her HEP. Assessment indicates limited R knee ROM, decreased strength (especially in quad), and decreased balance  affecting pt's safe mobility for home and in the community. TUG and 5x STS scores both indicate a high risk of falls. Pt will benefit from PT to improve on these issues, decrease pain and maximize level of function.   OBJECTIVE IMPAIRMENTS: Abnormal gait, decreased activity tolerance, decreased balance, decreased endurance, decreased mobility, difficulty walking, decreased ROM, decreased strength, hypomobility, increased fascial restrictions, increased muscle spasms, impaired flexibility,  improper body mechanics, postural dysfunction, and pain.   ACTIVITY LIMITATIONS: carrying, lifting, bending, sitting, standing, squatting, sleeping, stairs, transfers, bed mobility, bathing, toileting, dressing, hygiene/grooming, and locomotion level  PARTICIPATION LIMITATIONS: meal prep, cleaning, laundry, driving, shopping, and community activity  PERSONAL FACTORS: Age, Fitness, Past/current experiences, and Time since onset of injury/illness/exacerbation are also affecting patient's functional outcome.   REHAB POTENTIAL: Good  CLINICAL DECISION MAKING: Evolving/moderate complexity  EVALUATION COMPLEXITY: Moderate   GOALS: Goals reviewed with patient? Yes  SHORT TERM GOALS: Target date: 10/12/2023  Pt will be ind with initial HEP Baseline: Goal status: INITIAL  2.  Pt will have improved R knee ROM to >/=5-90 deg Baseline:  Goal status: MET 10/06/23: 5-92  3.  Pt will have improved 5x STS to </=15 sec to demo increasing functional LE strength Baseline: 20.74 sec 11/15/23: 18.42 sec Goal status: PARTIALLY MET    LONG TERM GOALS: Re-cert Target date: 12/13/2023   Pt will be ind with management and progression of HEP Baseline:  Goal status: MET  2.  Pt will have improved 5x STS to </=13 sec to be considered a low fall risk Baseline: 20.74 sec 11/15/23: 18.72 sec Goal status: ONGOING  3.  Pt will have improved TUG with LRAD to </=15 sec to demo decreased fall risk Baseline: 39.38  sec 11/15/23: 13.57 sec no a/d Goal status: MET  4.  Pt will be able to amb limited community distances (at least 500') independently Baseline:  11/15/23: Has been walking to church, grocery store and throughout community without a/d Goal status: MET  5.  Pt will have improved LEFS to >/=25/80 to demo MCID for TKA Baseline: 16 11/15/23: Lower Extremity Functional Score: 40 / 80 = 50.0 % Goal status: MET  6.  Pt will have improved knee ROM to >/=5-110 deg to safely negotiate stairs within the community Baseline: 15-73 deg 11/15/23: 5-105 deg Goal status: ONGOING   PLAN:  PT FREQUENCY: 1x/week  PT DURATION: 4 weeks  PLANNED INTERVENTIONS: 97164- PT Re-evaluation, 97750- Physical Performance Testing, 97110-Therapeutic exercises, 97530- Therapeutic activity, 97112- Neuromuscular re-education, 97535- Self Care, [id number]- Manual therapy, [id number]- Gait training, [phone]- Aquatic Therapy, [phone]- Ultrasound, 79439 (1-2 muscles), [id number] (3+ muscles)- Dry Needling, Patient/Family education, Balance training, Stair training, Taping, Joint mobilization, Scar mobilization, Cryotherapy, and Moist heat   [employer] medicaid -- doesn't cover vaso, estim, ionto  PLAN FOR NEXT SESSION: R LE progressive strengthening and end range ROM. Looking to fully meet goals.   [place] Kirchner April Ma L Kindsey Eblin, PT, DPT 11/15/2023, 11:08 AM   For all possible CPT codes, reference the Planned Interventions line above.     Check all conditions that are expected to impact treatment: {Conditions expected to impact treatment:None of these apply   If treatment provided at initial evaluation, no treatment charged due to lack of authorization.

## 2023-11-22 ENCOUNTER — Ambulatory Visit: Admitting: Physical Therapy

## 2023-11-22 NOTE — Therapy (Deleted)
 OUTPATIENT PHYSICAL THERAPY TREATMENT/RE-CERT NOTE   Patient Name: [name] MRN: [id number] DOB:December 10, 1960, 63 y.o., female Today's Date: 11/22/2023  END OF SESSION:         Past Medical History:  Diagnosis Date   Anemia when she was young   Arthritis    Congestion of nasal sinus    Constipation    takes Colace every other day   DDD (degenerative disc disease), lumbar    Dependent edema    Dry eyes    uses Systane Eye drops daily as needed   GERD (gastroesophageal reflux disease) 12/06/2011   takes Nexium  daily   Headache(784.0)    occasionally-d/t congestion   History of bronchitis 6-7 yrs ago   History of kidney stones    Hyperlipidemia    Hypertension    takes Metoprolol  and Diovan  daily   Hypothyroidism 12/06/2011   takes Synthroid  daily   Joint pain    Multiple allergies    takes Zyrtec daily;uses Nasonex daily as needed   Nocturia    Numbness    weakness-right hand   Onychomycosis    OSA on CPAP    Wears CPAP Nightly   Pancreatitis    Peripheral edema    takes Furosemide  daily   Pre-diabetes    S/P insertion of spinal cord stimulator    Shingles    Urinary frequency    Venous insufficiency of leg    Past Surgical History:  Procedure Laterality Date   ABDOMINAL HYSTERECTOMY     uterine prolapse   BACK SURGERY     L4-5   CARDIAC CATHETERIZATION  2007   CATARACT EXTRACTION, BILATERAL  2023   CHOLECYSTECTOMY     COLONOSCOPY     ESOPHAGOGASTRODUODENOSCOPY  5-66yrs ago   FOOT SURGERY Bilateral    KNEE ARTHROSCOPY Right    LAMINECTOMY THORACIC SPINE W/ PLACEMENT SPINAL CORD STIMULATOR     LAPAROSCOPIC GASTRIC SLEEVE RESECTION N/A 08/23/2016   Procedure: LAPAROSCOPIC GASTRIC SLEEVE RESECTION WITH UPPER ENDO;  Surgeon: Gladis Cough, MD;  Location: [facility];  Service: General;  Laterality: N/A;   POSTERIOR LUMBAR FUSION  03/10/2009   L3-4 and L3-4 fusion, Dr. Catalina Stains   SHOULDER ARTHROSCOPY WITH ROTATOR CUFF REPAIR AND SUBACROMIAL  DECOMPRESSION Right 08/23/2013   Procedure: RIGHT SHOULDER ARTHROSCOPY WITH  SUBACROMIAL DECOMPRESSION DISTAL CLAVICLE RESECTION AND  ROTATOR CUFF REPAIR ;  Surgeon: Franky CHRISTELLA Pointer, MD;  Location: [facility];  Service: Orthopedics;  Laterality: Right;   TOTAL KNEE ARTHROPLASTY Right 08/29/2023   Procedure: ARTHROPLASTY, KNEE, TOTAL;  Surgeon: Jerri Kay CHRISTELLA, MD;  Location: [facility];  Service: Orthopedics;  Laterality: Right;   TUBAL LIGATION     Patient Active Problem List   Diagnosis Date Noted   S/P TKR (total knee replacement), right 08/30/2023   Status post total right knee replacement 08/29/2023   Primary osteoarthritis of right knee 05/03/2023   Primary osteoarthritis of left knee 05/03/2023   Memory change 12/25/2020   Chronic pain syndrome 05/04/2019   Raynaud's disease 04/13/2019   Lumbar radiculopathy 12/18/2018   Spinal stenosis of lumbar region with neurogenic claudication 12/18/2018   Post laminectomy syndrome 12/18/2018   Bilateral primary osteoarthritis of knee 12/27/2016   History of excision of intestinal structure 09/07/2016   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 08/23/2016   Prediabetes 12/03/2015   OSA on CPAP 02/19/2015   Lung field abnormal 11/11/2014   Morbid obesity (HCC) 10/09/2014   Constipation 06/14/2014   Peripheral venous insufficiency 05/25/2013  Pulmonary HTN (HCC) 05/12/2013   Bilateral lower extremity edema 05/12/2013   Morbid obesity with BMI of 50.0-59.9, adult (HCC)    Shoulder joint pain 03/28/2013   Abnormal glucose level 02/20/2013   Alopecia 02/20/2013   Degeneration of lumbar intervertebral disc 02/20/2013   Hyperglycemia due to type 2 diabetes mellitus (HCC) 02/20/2013   Type 2 diabetes mellitus with other specified complication (HCC) 02/20/2013   Back pain, hx of prior back surgery 12/09/2011   Pancreatitis, mild, CT negative 12/08/2011   Fatty liver 12/08/2011   Substernal chest pain, suspected secondary to acute pancreatitis  12/06/2011   Hypertension 12/06/2011   Hypokalemia 12/06/2011   Hypothyroidism 12/06/2011   Anxiety 12/06/2011   BV (bacterial vaginosis)    Hyperlipidemia     PCP: Larnell Hamilton, MD  REFERRING PROVIDER: Jerri Kay HERO, MD  REFERRING DIAG: [phone] (ICD-10-CM) - S/P total knee arthroplasty, right  THERAPY DIAG:  No diagnosis found.  Rationale for Evaluation and Treatment: Rehabilitation  ONSET DATE: 08/29/23  SUBJECTIVE:   SUBJECTIVE STATEMENT: Pt states things are going better. Pt states she has been walking without the cane for a few weeks now without any issues.    From eval: Pt reports it's been hurting really bad. Pt's daughter and granddaughter are currently at home helping her. Pt reports she got 1 week of HHPT. Has been doing the exercises provided to her. Had been using the cane prior to surgery but left it in [place] . Has been using RW in the house currently.   PERTINENT HISTORY: Arthritis, DDD, HTN, spinal cord stimulator. Eval and treat right knee. S/p right TKA 08/29/2023  PAIN:  Are you having pain?  Yes: NPRS scale: 2/10 currently, at worst 2/10 within the past week Pain location: Front of R knee Pain description: Aching and throbbing Aggravating factors: Standing/walking, sometimes sitting Relieving factors: Ice, pain medication  PRECAUTIONS: None  RED FLAGS: None   WEIGHT BEARING RESTRICTIONS: No  FALLS:  Has patient fallen in last 6 months? No  LIVING ENVIRONMENT: Lives with: granddaughter Lives in: House/apartment Stairs: No; 2 steps to porch Has following equipment at home: Vannie - 2 wheeled and bed side commode  OCCUPATION: Retired - likes to watch TV, go out to eat  PLOF: Independent  PATIENT GOALS: Wants to get off using any a/d  NEXT MD VISIT: 09/15/23  OBJECTIVE:  Note: Objective measures were completed at Evaluation unless otherwise noted.  DIAGNOSTIC FINDINGS: n/a  PATIENT SURVEYS:  Lower Extremity Functional Score: 16 / 80  = 20.0 % Lower Extremity Functional Score: 40 / 80 = 50.0 %  COGNITION: Overall cognitive status: Within functional limits for tasks assessed     SENSATION: WFL no overt N/T  EDEMA:  Unable to accurately measure due to prevena wound vac not properly sealing down  MUSCLE LENGTH: Hamstrings: see knee ROM Thomas test: did not assess  POSTURE: flexed trunk   PALPATION: TTP along R knee joint line  LOWER EXTREMITY ROM:  Active ROM Right eval Left eval Right 09/22/23 Right 10/06/23 Right 10/12/23 Right 10/18/23 Right 10/31/2023 Right 11/15/23  Hip flexion          Hip extension          Hip abduction          Hip adduction          Hip internal rotation          Hip external rotation          Knee flexion 73  sitting 100 sitting 87 sitting 92 sitting  82 sitting EOM with OP 88 AROM supine beginning  106 AAROM 105 AROM sitting  Knee extension -15 sitting -10 sitting -10 LAQ -8 quad set -5 quad set sitting   4 -5 quad set sitting 0 PROM  Ankle dorsiflexion          Ankle plantarflexion          Ankle inversion          Ankle eversion           (Blank rows = not tested)  LOWER EXTREMITY MMT:  MMT Right eval Left eval  Hip flexion 4 4  Hip extension    Hip abduction supine 3+ 4  Hip adduction    Hip internal rotation    Hip external rotation    Knee flexion 4 5  Knee extension 4 5  Ankle dorsiflexion    Ankle plantarflexion    Ankle inversion    Ankle eversion     (Blank rows = not tested)  LOWER EXTREMITY SPECIAL TESTS:  Did not assess  FUNCTIONAL TESTS:  Eval 5x STS: 20.74 sec with UE  TUG: 39.38 sec with RW  Re-cert 89/78/74  5x STS: 21.84 sec without UE, 18.42 sec with UE  TUG: 13.57 Sec without a/d  GAIT: Distance walked: Into clinic Assistive device utilized: Environmental Consultant - 2 wheeled Level of assistance: Modified independence Comments: Antalgic, trunk flexed, decreased R hip ext  TODAY'S TREATMENT:  [facility] Adult PT Treatment:                                                 DATE: 11/15/2023 Therapeutic Exercise: Recumbent bike L1 x 6 min Sitting LAQ green TB 10 x3 sec Sitting hamstring curl green TB 10 x3 sec Therapeutic Activity: Rechecked goals (see below) Sit<>stand 3x5 Staggered st<>stand x10   [facility] Adult PT Treatment:                                                DATE: 11/10/2023 Therapeutic Exercise: Hee slide on board, strap for overpressure 2x20, hold 3s Neuromuscular re-ed: Standing TKE against RTB 2x8, 8s hold Supine QS 1x30, 3s hold Therapeutic Activity: Rec Bike 8' Heel raise on slant board 2x15, Ue a. PRN   [facility] Adult PT Treatment:                                                DATE: 11/07/2023 Neuromuscular re-ed: NMES with quad set and SAQ Dual channel Russian, 32 intensity, 10/10 Quad set, SAQ  Therapeutic Activity: Heel slide  2x20 on slide board  HEP modification, educaiton for resting knee extension     [facility] Adult PT Treatment:                                                DATE: 10/31/2023 Nustep L5 x 4 mins for ROM and functional activity tolerance Supine heel slide 2x10 -  5 hold R Supine QS with heel propped 2x10 - 5 hold R Supine SLR 2x8 R LAQ 3x10 2# R STS 2x10 - no UE Step ups 2x10 6in R leading 1 UE Knee flexion step stretch 2x10 5 hold 6in Standing TKE x 10 - 5 hold R       PATIENT EDUCATION:  Education details: HEP updates, reassessment findings/goal status Person educated: Patient Education method: Explanation, Demonstration, and Handouts Education comprehension: verbalized understanding, returned demonstration, and needs further education  HOME EXERCISE PROGRAM: Access Code: [email or web address] URL: [email or web address] Date: 11/15/2023 Prepared by: Festus Pursel April Earnie Starring  Exercises - Seated Knee Extension with Resistance  - 1 x daily - 7 x weekly - 2 sets - 10 reps - 3 sec hold - Seated Hamstring Curls with Resistance  - 1 x daily - 7 x weekly - 2  sets - 10 reps - 3 sec hold - Staggered Sit-to-Stand  - 1 x daily - 7 x weekly - 2 sets - 10 reps - 3 sec hold - Seated Knee Extension Stretch With Overpressure  - 1 x daily - 7 x weekly - 1 sets - 10 reps - 5 sec hold - Seated Knee Flexion AAROM  - 1 x daily - 7 x weekly - 1 sets - 10 reps - 5 sec hold  ASSESSMENT:  CLINICAL IMPRESSION: *** Ms. Krystol continues to progress well towards her goals -- she has met 4 out of the 6. The 2 goals she has not fully met are her ROM and strength based on her 5x STS. Although she has R = L knee ROM, she has potential to maximize motion for improved stair ascent/descent in the community. Additionally, although she has not fully met her 5x STS, it has slightly improved and she is now capable of performing them without UE assist. Her amb has otherwise greatly improved and she is able to amb community distances without a/d. Ms. Leva will benefit from a few more weeks of PT at a reduced frequency to fully meet the rest of her goals. Updated pt's HEP accordingly to work on these continued deficits.    From eval: Patient is a 63 y.o. F who was seen today for physical therapy evaluation and treatment s/p R TKA on 08/29/23. Pt has received 1 week of home health and states she has been compliant with her HEP. Assessment indicates limited R knee ROM, decreased strength (especially in quad), and decreased balance affecting pt's safe mobility for home and in the community. TUG and 5x STS scores both indicate a high risk of falls. Pt will benefit from PT to improve on these issues, decrease pain and maximize level of function.   OBJECTIVE IMPAIRMENTS: Abnormal gait, decreased activity tolerance, decreased balance, decreased endurance, decreased mobility, difficulty walking, decreased ROM, decreased strength, hypomobility, increased fascial restrictions, increased muscle spasms, impaired flexibility, improper body mechanics, postural dysfunction, and pain.   ACTIVITY  LIMITATIONS: carrying, lifting, bending, sitting, standing, squatting, sleeping, stairs, transfers, bed mobility, bathing, toileting, dressing, hygiene/grooming, and locomotion level  PARTICIPATION LIMITATIONS: meal prep, cleaning, laundry, driving, shopping, and community activity  PERSONAL FACTORS: Age, Fitness, Past/current experiences, and Time since onset of injury/illness/exacerbation are also affecting patient's functional outcome.   REHAB POTENTIAL: Good  CLINICAL DECISION MAKING: Evolving/moderate complexity  EVALUATION COMPLEXITY: Moderate   GOALS: Goals reviewed with patient? Yes  SHORT TERM GOALS: Target date: 10/12/2023  Pt will be ind with initial HEP Baseline: Goal status: INITIAL  2.  Pt will have improved  R knee ROM to >/=5-90 deg Baseline:  Goal status: MET 10/06/23: 5-92  3.  Pt will have improved 5x STS to </=15 sec to demo increasing functional LE strength Baseline: 20.74 sec 11/15/23: 18.42 sec Goal status: PARTIALLY MET    LONG TERM GOALS: Re-cert Target date: 12/13/2023   Pt will be ind with management and progression of HEP Baseline:  Goal status: MET  2.  Pt will have improved 5x STS to </=13 sec to be considered a low fall risk Baseline: 20.74 sec 11/15/23: 18.72 sec Goal status: ONGOING  3.  Pt will have improved TUG with LRAD to </=15 sec to demo decreased fall risk Baseline: 39.38 sec 11/15/23: 13.57 sec no a/d Goal status: MET  4.  Pt will be able to amb limited community distances (at least 500') independently Baseline:  11/15/23: Has been walking to church, grocery store and throughout community without a/d Goal status: MET  5.  Pt will have improved LEFS to >/=25/80 to demo MCID for TKA Baseline: 16 11/15/23: Lower Extremity Functional Score: 40 / 80 = 50.0 % Goal status: MET  6.  Pt will have improved knee ROM to >/=5-110 deg to safely negotiate stairs within the community Baseline: 15-73 deg 11/15/23: 5-105 deg Goal  status: ONGOING   PLAN:  PT FREQUENCY: 1x/week  PT DURATION: 4 weeks  PLANNED INTERVENTIONS: 97164- PT Re-evaluation, 97750- Physical Performance Testing, 97110-Therapeutic exercises, 97530- Therapeutic activity, 97112- Neuromuscular re-education, 97535- Self Care, [id number]- Manual therapy, [id number]- Gait training, [id number]- Aquatic Therapy, [phone]- Ultrasound, 79439 (1-2 muscles), 20561 (3+ muscles)- Dry Needling, Patient/Family education, Balance training, Stair training, Taping, Joint mobilization, Scar mobilization, Cryotherapy, and Moist heat   Healthy blue medicaid -- doesn't cover vaso, estim, ionto  PLAN FOR NEXT SESSION: R LE progressive strengthening and end range ROM. Looking to fully meet goals.   [place] Weekly April Ma L Bobi Daudelin, PT, DPT 11/22/2023, 8:02 AM   For all possible CPT codes, reference the Planned Interventions line above.     Check all conditions that are expected to impact treatment: {Conditions expected to impact treatment:None of these apply   If treatment provided at initial evaluation, no treatment charged due to lack of authorization.

## 2023-11-24 ENCOUNTER — Other Ambulatory Visit (HOSPITAL_COMMUNITY): Payer: Self-pay

## 2023-11-28 ENCOUNTER — Encounter: Payer: Self-pay | Admitting: Radiology

## 2023-11-29 ENCOUNTER — Ambulatory Visit: Attending: Orthopaedic Surgery | Admitting: Physical Therapy

## 2023-11-29 ENCOUNTER — Encounter: Payer: Self-pay | Admitting: Psychology

## 2023-11-29 ENCOUNTER — Encounter: Attending: Psychology | Admitting: Psychology

## 2023-11-29 DIAGNOSIS — F0280 Dementia in other diseases classified elsewhere without behavioral disturbance: Secondary | ICD-10-CM | POA: Insufficient documentation

## 2023-11-29 DIAGNOSIS — M25561 Pain in right knee: Secondary | ICD-10-CM | POA: Insufficient documentation

## 2023-11-29 DIAGNOSIS — G4733 Obstructive sleep apnea (adult) (pediatric): Secondary | ICD-10-CM | POA: Insufficient documentation

## 2023-11-29 DIAGNOSIS — R413 Other amnesia: Secondary | ICD-10-CM | POA: Insufficient documentation

## 2023-11-29 DIAGNOSIS — M6281 Muscle weakness (generalized): Secondary | ICD-10-CM | POA: Insufficient documentation

## 2023-11-29 DIAGNOSIS — G309 Alzheimer's disease, unspecified: Secondary | ICD-10-CM | POA: Insufficient documentation

## 2023-11-29 DIAGNOSIS — R262 Difficulty in walking, not elsewhere classified: Secondary | ICD-10-CM | POA: Insufficient documentation

## 2023-11-29 DIAGNOSIS — R293 Abnormal posture: Secondary | ICD-10-CM | POA: Insufficient documentation

## 2023-11-29 DIAGNOSIS — M25661 Stiffness of right knee, not elsewhere classified: Secondary | ICD-10-CM | POA: Insufficient documentation

## 2023-11-29 DIAGNOSIS — G894 Chronic pain syndrome: Secondary | ICD-10-CM | POA: Insufficient documentation

## 2023-11-29 NOTE — Progress Notes (Signed)
 Neuropsychological Evaluation   Patient:  [name]   DOB: 12-30-1960  MR Number: [id number]  Location: [facility] [address] [place], [address]  [place] [address] Dept: [phone]  Start: 9:30 AM End: 10:30 AM  Provider/Observer:     Norleen JONELLE Asa PsyD  Chief Complaint:      Chief Complaint  Patient presents with   Memory Loss   Sleeping Problem   Anxiety   11/29/2023: Today I provided feedback to the patient regarding the results of the recent neuropsychological evaluation with diagnostic considerations and treatment recommendations provided.  Below is a summary of the feedback provided in recommendations including returning for repeat comparative testing in approximately 1 year postinitial assessment.  The patient was also encouraged to reach out to her neurology group to continue to look at treatment options through their office.  Results of the current neuropsychological evaluation show consistency between subjective reports of memory problems and objective test findings. Objective assessment revealed more significant deficits than have been noted subjectively.  Significant attentional deficits were noted, particularly with auditory and visual encoding. Deficits in both visual and verbal reasoning and problem-solving were also present. There was no indication of lateralization, with deficits observed bilaterally, suggesting a diffuse process rather than focal issues like small strokes. Significant deficits were present with auditory and visual learning, with some improvement under recognition conditions. This suggests a retrieval-based deficit rather than a pure encoding failure.  The clinical presentation, objective test data, and available medical information are consistent with a cortically-based progressive dementia. Deficits are noted in multiple cognitive domains  including memory, problem-solving, attention, and language.  The patient's ATN profile from blood work indicated an increased risk for Alzheimer's disease. 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 While some small vessel changes were noted on imaging, they are not of a sufficient degree to explain the observed cognitive difficulties. There are no symptoms consistent with Lewy body dementia, such as visual or auditory hallucinations, tremors, or gait disturbance. The previously reported visual spots are thought to be related to cataracts, which have since been addressed.  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 Progression appears to have been slow over the past two years, which is a positive prognostic indicator.  Recommendations provided include ensuring all legal affairs are in order. The patient was assessed as competent to make decisions. Advised to follow up with Neurology to discuss management. The patient reports restarting Donepezil  (Aricept ) approximately four to five months ago and is tolerating it well without the previously reported visual side effects. Continued compliance with CPAP for sleep apnea was encouraged. A follow-up neuropsychological evaluation is scheduled in approximately nine months to monitor for changes. A follow-up appointment was scheduled in approximately 1 year postinitial testing to provide repeat comparisons to assess current diagnostic considerations.  I have also included a copy of the reason for service for the neuropsychological evaluation and the impression/summary of the evaluative report below for convenience and the full neuropsych evaluation report can be found in the patient's EMR dated 11/10/2023.   Reason For Service:     ALLEEN KEHM is a 63 year old female referred for neuropsychological  evaluation by her neurologist, True Mar, MD, due to increasing difficulties with memory. Past medical history includes hypertension, hyperlipidemia, hypothyroidism, allergies, pancreatitis, peripheral edema, anemia,  arthritis, degenerative lumbar disc disease, chronic low back pain, status post spinal cord stimulator, diabetes, reflux disease, recurrent headaches, obstructive sleep apnea on CPAP, venous insufficiency, severe obesity (BMI > 40), and a history of multiple surgeries including hysterectomy, back surgery, spinal cord stimulator placement, cataract extractions, orthopedic knee surgery, rotator cuff repair, and gastric sleeve. The patient was initially referred to neurology by her primary care provider in 2024 for a several-month history of forgetfulness. A MoCA score at that time was 15. She reports stable long-term memory but notes short-term memory issues began approximately two years ago, concurrent with significant pain and use of multiple pain medications. Functionally, she denies trouble driving. She reports mild difficulty with problem-solving on a computer. She has had a small amount of geographic disorientation, becoming turned around on one or two occasions over the past two years, but this is not a common occurrence. She denies changes in expressive language or word-finding. She notes some visual disturbances, describing seeing black spots after turning off lights and observing dark spots moving while watching TV; she has a history of cataracts. She denies tremors. There is a family history of a progressive degenerative process; her mother was diagnosed with Alzheimer's disease and passed away at 29. Other family members developed cognitive issues in their 60s. Her mother's cognitive symptoms reportedly began in her 49s and worsened in her 90s. Her obstructive sleep apnea is treated with CPAP, and records indicate use on 27 of 30 days with over four hours of use 73% of the time, utilizing a  new nasal mask. Despite compliance, she reports poor sleep quality, sleeping intermittently. The importance of addressing sleep apnea was reiterated. She was started on low-dose donepezil  (Aricept ) but discontinued it after a few days due to blurry vision.   Onset and Duration of Symptoms:  Short-term memory issues were first noticed approximately two years ago. This occurred during a period of significant pain and use of multiple pain medications. The initial referral to neurology for forgetfulness was in 2024.   Progression of Symptoms:  The patient reports that her memory difficulties have remained stable over the past year and a half.   Additional Tests and Measures from other records:   Neuroimaging Results:  An MRI in 2024 showed no evidence of acute intracranial abnormality and no age-advanced or lobar predominant atrophy. It did note a few small, non-specific, chronic T2 flare hyperintensities scattered within the cerebral white matter, considered most often secondary to chronic small vessel ischemia.    Laboratory Tests:  Extensive blood work in July 2024 was largely benign. This included vitamin D , A1C, ANA, RPR, B12, folate, TSH, ESR, vitamin B6, vitamin B1, ATN profile, and ApoE Alzheimer's risk profile. The ATN profile favored the possibility of underlying Alzheimer's dementia. The APOE genotype was E3/E4, which correlates with an increased lifetime risk for late-onset Alzheimer's disease.  Patient's ATN profile included a low beta amyloid 42/40 ratio and an elevated P tau 181 finding with results consistent with findings found to correlate with the presence of Alzheimer's type pathology.   Sleep:  While compliant with CPAP, the patient reports not sleeping well overall, with an intermittent sleep pattern.  Impression/Diagnosis:   The results of the current neuropsychological evaluation do show consistency between the patient's subjective reports as well as objective neuropsychological  test data although her objective assessment showed even more significant deficits that the patient has been noting herself and descriptions of her problems.  Significant attentional deficits including auditory and visual encoding as  well as slowed focus execute/information processing speed's were noted.  There were deficits in executive functioning including visual and verbal reasoning and problem-solving capacities as well as cognitive flexibility limitations.  There was no indication of lateralization of findings with consistencies and deficits for both test data more consistently associated with right hemispheric versus left hemispheric functioning.  The patient showed significant deficits for both auditory and visual information with some improvements under recognition/cued recall but still significant deficits.  As far as there he diagnostic considerations that would be most appropriate with the current objective test data, available medical information as well as subjective reports would be consistent with a diagnosis of a cortically mediated condition consistent with an Alzheimer's type pathology.  The patient's ATN profile and APOE genetic profile would also be consistent with these findings and while she showed some indication of small vessel disease these were not to a degree that would account for her level of cognitive deficits noted.  The patient has been prescribed Aricept  previously but noted difficulties with acute changes in vision and this was discontinued after couple of days.  The patient has generally been compliant with her CPAP device.  The patient would meet the diagnostic criterion for a early onset major neurocognitive disorder of the Alzheimer's type.  Given the visual disturbance she is noted does raise the specter of a possible Lewy body type profile although she shows no tremors or other gait disturbance that are often typically associated with Lewy body type process.  The most likely  component is 1 of an Alzheimer's type pathology.  As far as recommendations, I will sit down with the patient go over the results of the current neuropsychological evaluation.  We will need to do repeat testing in approximately 1 year after initial assessment to look for any change and provide more determinative diagnostic considerations.  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 Clinical Neuropsychologist

## 2023-11-29 NOTE — Progress Notes (Signed)
 Neuropsychological Evaluation   Patient:  [name]   DOB: 10-Aug-1960  MR Number: [id number]  Location: [facility] [address] [place], [address] [place] [address] Dept: [phone]  Start: 2 PM End: 3 PM  Provider/Observer:     Norleen JONELLE Asa PsyD  Chief Complaint:      Chief Complaint  Patient presents with   Memory Loss   Anxiety   Sleeping Problem    Reason For Service:     [name] is a 63 year old female referred for neuropsychological evaluation by her neurologist, True Mar, MD, due to increasing difficulties with memory. Past medical history includes hypertension, hyperlipidemia, hypothyroidism, allergies, pancreatitis, peripheral edema, anemia, arthritis, degenerative lumbar disc disease, chronic low back pain, status post spinal cord stimulator, diabetes, reflux disease, recurrent headaches, obstructive sleep apnea on CPAP, venous insufficiency, severe obesity (BMI > 40), and a history of multiple surgeries including hysterectomy, back surgery, spinal cord stimulator placement, cataract extractions, orthopedic knee surgery, rotator cuff repair, and gastric sleeve. The patient was initially referred to neurology by her primary care provider in 2024 for a several-month history of forgetfulness. A MoCA score at that time was 15. She reports stable long-term memory but notes short-term memory issues began approximately two years ago, concurrent with significant pain and use of multiple pain medications. Functionally, she denies trouble driving. She reports mild difficulty with problem-solving on a computer. She has had a small amount of geographic disorientation, becoming turned around on one or two occasions over the past two years, but this is not a common occurrence. She denies changes in expressive language or word-finding. She notes some visual disturbances,  describing seeing black spots after turning off lights and observing dark spots moving while watching TV; she has a history of cataracts. She denies tremors. There is a family history of a progressive degenerative process; her mother was diagnosed with Alzheimer's disease and passed away at 34. Other family members developed cognitive issues in their 49s. Her mother's cognitive symptoms reportedly began in her 61s and worsened in her 90s. Her obstructive sleep apnea is treated with CPAP, and records indicate use on 27 of 30 days with over four hours of use 73% of the time, utilizing a new nasal mask. Despite compliance, she reports poor sleep quality, sleeping intermittently. The importance of addressing sleep apnea was reiterated. She was started on low-dose donepezil  (Aricept ) but discontinued it after a few days due to blurry vision.   Onset and Duration of Symptoms:  Short-term memory issues were first noticed approximately two years ago. This occurred during a period of significant pain and use of multiple pain medications. The initial referral to neurology for forgetfulness was in 2024.   Progression of Symptoms:  The patient reports that her memory difficulties have remained stable over the past year and a half.   Additional Tests and Measures from other records:   Neuroimaging Results:  An MRI in 2024 showed no evidence of acute intracranial abnormality and no age-advanced or lobar predominant atrophy. It did note a few small, non-specific, chronic T2 flare hyperintensities scattered within the cerebral white matter, considered most often secondary to chronic small vessel ischemia.    Laboratory Tests:  Extensive blood work in July 2024 was largely benign. This included vitamin D , A1C, ANA, RPR, B12, folate, TSH, ESR, vitamin B6, vitamin B1, ATN profile, and ApoE Alzheimer's risk profile. The ATN profile favored the possibility  of underlying Alzheimer's dementia. The APOE genotype was E3/E4, which  correlates with an increased lifetime risk for late-onset Alzheimer's disease.  Patient's ATN profile included a low beta amyloid 42/40 ratio and an elevated P tau 181 finding with results consistent with findings found to correlate with the presence of Alzheimer's type pathology.   Sleep:  While compliant with CPAP, the patient reports not sleeping well overall, with an intermittent sleep pattern.   Medical History:                         Past Medical History:  Diagnosis Date   Anemia when she was young   Arthritis     Congestion of nasal sinus     Constipation      takes Colace every other day   DDD (degenerative disc disease), lumbar     Dependent edema     Dry eyes      uses Systane Eye drops daily as needed   GERD (gastroesophageal reflux disease) 12/06/2011    takes Nexium  daily   Headache(784.0)      occasionally-d/t congestion   History of bronchitis 6-7 yrs ago   History of kidney stones     Hyperlipidemia     Hypertension      takes Metoprolol  and Diovan  daily   Hypothyroidism 12/06/2011    takes Synthroid  daily   Joint pain     Multiple allergies      takes Zyrtec daily;uses Nasonex daily as needed   Nocturia     Numbness      weakness-right hand   Onychomycosis     OSA on CPAP      Wears CPAP Nightly   Pancreatitis     Peripheral edema      takes Furosemide  daily   Pre-diabetes     S/P insertion of spinal cord stimulator     Shingles     Urinary frequency     Venous insufficiency of leg                                                                 Patient Active Problem List    Diagnosis Date Noted   S/P TKR (total knee replacement), right 08/30/2023   Status post total right knee replacement 08/29/2023   Primary osteoarthritis of right knee 05/03/2023   Primary osteoarthritis of left knee 05/03/2023   Memory change 12/25/2020   Chronic pain syndrome 05/04/2019   Raynaud's disease 04/13/2019   Lumbar radiculopathy 12/18/2018   Spinal  stenosis of lumbar region with neurogenic claudication 12/18/2018   Post laminectomy syndrome 12/18/2018   Bilateral primary osteoarthritis of knee 12/27/2016   History of excision of intestinal structure 09/07/2016   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 08/23/2016   Prediabetes 12/03/2015   OSA on CPAP 02/19/2015   Lung field abnormal 11/11/2014   Morbid obesity (HCC) 10/09/2014   Constipation 06/14/2014   Peripheral venous insufficiency 05/25/2013   Pulmonary HTN (HCC) 05/12/2013   Bilateral lower extremity edema 05/12/2013   Morbid obesity with BMI of 50.0-59.9, adult (HCC)     Shoulder joint pain 03/28/2013   Abnormal glucose level 02/20/2013   Alopecia 02/20/2013   Degeneration of lumbar intervertebral disc 02/20/2013   Hyperglycemia  due to type 2 diabetes mellitus (HCC) 02/20/2013   Type 2 diabetes mellitus with other specified complication (HCC) 02/20/2013   Back pain, hx of prior back surgery 12/09/2011   Pancreatitis, mild, CT negative 12/08/2011   Fatty liver 12/08/2011   Substernal chest pain, suspected secondary to acute pancreatitis 12/06/2011   Hypertension 12/06/2011   Hypokalemia 12/06/2011   Hypothyroidism 12/06/2011   Anxiety 12/06/2011   BV (bacterial vaginosis)     Hyperlipidemia      Tests Administered: Controlled Oral Word Association Test (COWAT; FAS & Animals)  Grooved Pegboard Wechsler Adult Intelligence Scale, 4th Edition (WAIS-IV) Wechsler Memory Scale, 4th Edition (WMS-IV); Adult Battery [place]  Card Sorting Test [facility]) (Discontinued)  Participation Level:   Active  Participation Quality:  Appropriate      Behavioral Observation:  The patient appeared well-groomed and appropriately dressed. Her manners were polite and appropriate to the situation. The patient's attitude towards testing was positive and she demonstrated a good effort.   Well Groomed, Alert, and Appropriate.   Test Results:   Initially, an estimation as to the  patient's premorbid intellectual and cognitive abilities was utilized to provide a comparison point in analysis of current objective neuropsychological test data.  The patient graduated from high school and had some college courses post high school.  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  We will utilize a conservative estimate of the patient historically performing in the average range relative to a normative population but the patient likely had some cognitive domains in the high average range relative to normative comparisons.  Secondly, an estimation of the validity of the current test data was made utilizing both behavioral observations as well as embedded validity checks.  The patient did provide good effort throughout the objective testing session and maintain a positive attitude and good effort throughout.  Embedded validity checks including forced choice type of recognition measures along with other embedded validity checks also suggest that this is a fair and valid assessment of the patient's current cognitive status.  COWAT:  FAS total= 17 Z= -1.60 Animals total= 8 Z= -2.04    Grooved Pegboard:  R (DH) time= 90s  Percentile Rank=32nd Drops=0 L (NDH) time= 100s  Percentile Rank= 36th  Drops=1   WAIS-IV:              Composite Score Summary          Scale Sum of Scaled Scores Composite Score Percentile Rank 95% Conf. Interval Qualitative Description  Verbal Comprehension 20 VCI 81 10 76-87 Low Average  Perceptual Reasoning 20 PRI 81 10 76-88 Low Average  Working Memory 10 WMI 71 3 66-80 Borderline  Processing Speed 12 PSI 79 8 73-89 Borderline  Full Scale 62 FSIQ 74 4 70-79 Borderline  General Ability 40 GAI 79 8 75-85 Borderline              Verbal Comprehension Subtests Summary        Subtest Raw Score Scaled Score Percentile Rank Reference Group Scaled Score SEM  Similarities 21 8 25 8  1.08  Vocabulary 28 8 25 8   0.73  Information 4 4 2 5  0.67  The scaled scores in the Reference Group Scaled Score column are based on the performance of examinees aged 20:0-34:11 (i.e., the reference group). See Chapter 6 of the WAIS-IV Technical and Interpretive Manual for more information.           Perceptual Reasoning Subtests Summary  Subtest Raw Score Scaled Score Percentile Rank Reference Group Scaled Score   Block Design 24 7 16 6  1.04  Matrix Reasoning 8 6 9 4  0.95  Visual Puzzles 8 7 16 6  0.99          Working Comptroller Raw Score Scaled Score Percentile Rank Reference Group Scaled Score SEM  Digit Span 20 7 16 6  0.85  Arithmetic 6 3 1 4  1.04          Processing Speed Subtests Summary        Subtest Raw Score Scaled Score Percentile Rank Reference Group Scaled Score SEM  Symbol Search 15 5 5 4  1.31  Coding 42 7 16 5  0.99    WMS-IV:            Index Score Summary        Index Sum of Scaled Scores Index Score Percentile Rank 95% Confidence Interval Qualitative Descriptor  Auditory Memory (AMI) 8 49 <0.1 45-58 Extremely Low  Visual Memory (VMI) 14 59 0.3 55-66 Extremely Low  Visual Working Memory (VWMI) 8 63 1 58-73 Extremely Low  Immediate Memory (IMI) 11 51 0.1 47-60 Extremely Low  Delayed Memory (DMI) 11 51 0.1 47-61 Extremely Low           Primary Subtest Scaled Score Summary       Subtest Domain Raw Score Scaled Score Percentile Rank  Logical Memory I AM 6 1 0.1  Logical Memory II AM 1 1 0.1  Verbal Paired Associates I AM 6 3 1   Verbal Paired Associates II AM 2 3 1   Designs I VM 46 6 9  Designs II VM 35 6 9  Visual Reproduction I VM 15 1 0.1  Visual Reproduction II VM 0 1 0.1  Spatial Addition VWM 3 4 2   Symbol Span VWM 6 4 2           Auditory Memory Process Score Summary      Process Score Raw Score Scaled Score Percentile Rank Cumulative Percentage (Base Rate)  LM II Recognition 16 - - <=2%  VPA II Recognition 25 - - <=2%         Visual  Memory Process Score Summary      Process Score Raw Score Scaled Score Percentile Rank Cumulative Percentage (Base Rate)  DE I Content 29 7 16  -  DE I Spatial 11 6 9  -  DE II Content 22 5 5  -  DE II Spatial 7 6 9  -  DE II Recognition 13 - - 26-50%  VR II Recognition 5 - - 26-50%     ABILITY-MEMORY ANALYSIS   Ability Score:    VCI: 81 Date of Testing:           WAIS-IV; WMS-IV 2023/07/19             Predicted Difference Method    Index Predicted WMS-IV Index Score Actual WMS-IV Index Score Difference Critical Value   Significant Difference Y/N Base Rate  Auditory Memory 90 49 41 9.00 Y <1%  Visual Memory 91 59 32 8.38 Y <1%  Visual Working Memory 90 63 27 10.86 Y 1-2%  Immediate Memory 89 51 38 10.12 Y <1%  Delayed Memory 90 51 39 9.95 Y <1%  Statistical significance (critical value) at the .01 level.            WCST (Partial data):    WCST scores Raw scores  Trials Administered  Total Correct 64 40       Total Errors % Errors 24 38%       Perseverative Responses % Perseverative Responses 11 17%       Perseverative Errors % Perseverative Errors 11 17%       Nonperseverative Errors % Nonperseverative Errors 13 20%       Conceptual Level Responses % Conceptual Level Responses 37 58%       Categories Completed Trials to Complete 1st Category Failure to Maintain Set Learning to Learn 2 11 1  -39.90       Summary of Results:   Lexical fluency was assessed using the Controlled Oral Word Association Test (FAS test). A total of 17 words were produced, which is 1.6 standard deviations below age and education-based normative comparisons. On the animal naming measure, a total of eight words were produced, which is more than two standard deviations below the norm, suggesting word-finding and verbal fluency difficulties.  Fine motor control was within normal limits, with performance on the right dominant hand and left non-dominant hand at the 32nd and 36th  percentile, respectively.  There was no indication of any lateralization of findings  Cognitive testing revealed a full-scale IQ score of 74 (4th percentile, borderline range) and a General Abilities Index score of 79 (8th percentile, borderline range). These scores are approaching two standard deviations below predicted levels. There was consistency between individual composite scores. The best relative performance was observed in verbal-based language skills and visual-spatial skills, though these remained over 20 points below normative expectations. A similar level of performance was noted for information processing, including focus, executive abilities, and information processing speed.  The lowest performance was on measures of auditory encoding, with a Working Memory Index score of 71 (3rd percentile). Particular weakness was noted on subtests related to the retrieval of general fund of information. There were mild weaknesses with primary auditory encoding capacities. Significant weakness was also observed on measures of visual scanning, visual searching, and overall speed of mental operations (focus/executive abilities).  Objective memory testing showed profound and significant weaknesses for both auditory and visual memory. Primary visual encoding capacity was significantly weak, even more so than the already weak auditory encoding capacity. Little variability was seen across individual memory subtests, with all performances well below predicted levels of premorbid functioning. There are profound deficits in the capacity to initially learn and store both auditory and visual information, performing even below what would be predicted by weak encoding capacities. The limited information that was stored remained available after a delay, but this represented a very limited capacity. Performance showed significant improvement with recognition or cued recall formats.  The [place]  Card Sorting Test,  assessing visual reasoning and problem-solving/executive functioning, was administered. Significant difficulty was noted, with multiple perseverative errors, perseverative responses, and failure to maintain set. Only two categories were completed after 64 trials, at which point the test was discontinued.  Overall, the results of the neuropsychological evaluation indicate objective deficits even more significant than subjective reports of memory and learning difficulties. Deficits were noted in focus, executive function, auditory and visual encoding, retrieval of long-standing information, visual-spatial and visual processing capacities. These are in addition to significant and profound memory difficulties for learning both auditory and visual information.  Impression/Diagnosis:   The results of the current neuropsychological evaluation do show consistency between the patient's subjective reports as well as objective neuropsychological test data although her objective assessment showed even more significant deficits that the patient has been noting herself  and descriptions of her problems.  Significant attentional deficits including auditory and visual encoding as well as slowed focus execute/information processing speed's were noted.  There were deficits in executive functioning including visual and verbal reasoning and problem-solving capacities as well as cognitive flexibility limitations.  There was no indication of lateralization of findings with consistencies and deficits for both test data more consistently associated with right hemispheric versus left hemispheric functioning.  The patient showed significant deficits for both auditory and visual information with some improvements under recognition/cued recall but still significant deficits.  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  The patient's ATN profile and APOE genetic profile would also be consistent with these findings and while she showed some indication of small vessel disease these were not to a degree that would account for her level of cognitive deficits noted.  The patient has been prescribed Aricept  previously but noted difficulties with acute changes in vision and this was discontinued after couple of days.  The patient has generally been compliant with her CPAP device.  The patient would meet the diagnostic criterion for a early onset major neurocognitive disorder of the Alzheimer's type.  Given the visual disturbance she is noted does raise the specter of a possible Lewy body type profile although she shows no tremors or other gait disturbance that are often typically associated with Lewy body type process.  The most likely component is 1 of an Alzheimer's type pathology.  As far as recommendations, I will sit down with the patient go over the results of the current neuropsychological evaluation.  We will need to do repeat testing in approximately 1 year after initial assessment to look for any change and provide more determinative diagnostic considerations.  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 Clinical Neuropsychologist

## 2023-11-29 NOTE — Therapy (Deleted)
 OUTPATIENT PHYSICAL THERAPY TREATMENT/RE-CERT NOTE   Patient Name: [name] MRN: [id number] DOB:11-04-1960, 63 y.o., female Today's Date: 11/29/2023  END OF SESSION:         Past Medical History:  Diagnosis Date   Anemia when she was young   Arthritis    Congestion of nasal sinus    Constipation    takes Colace every other day   DDD (degenerative disc disease), lumbar    Dependent edema    Dry eyes    uses Systane Eye drops daily as needed   GERD (gastroesophageal reflux disease) 12/06/2011   takes Nexium  daily   Headache(784.0)    occasionally-d/t congestion   History of bronchitis 6-7 yrs ago   History of kidney stones    Hyperlipidemia    Hypertension    takes Metoprolol  and Diovan  daily   Hypothyroidism 12/06/2011   takes Synthroid  daily   Joint pain    Multiple allergies    takes Zyrtec daily;uses Nasonex daily as needed   Nocturia    Numbness    weakness-right hand   Onychomycosis    OSA on CPAP    Wears CPAP Nightly   Pancreatitis    Peripheral edema    takes Furosemide  daily   Pre-diabetes    S/P insertion of spinal cord stimulator    Shingles    Urinary frequency    Venous insufficiency of leg    Past Surgical History:  Procedure Laterality Date   ABDOMINAL HYSTERECTOMY     uterine prolapse   BACK SURGERY     L4-5   CARDIAC CATHETERIZATION  2007   CATARACT EXTRACTION, BILATERAL  2023   CHOLECYSTECTOMY     COLONOSCOPY     ESOPHAGOGASTRODUODENOSCOPY  5-58yrs ago   FOOT SURGERY Bilateral    KNEE ARTHROSCOPY Right    LAMINECTOMY THORACIC SPINE W/ PLACEMENT SPINAL CORD STIMULATOR     LAPAROSCOPIC GASTRIC SLEEVE RESECTION N/A 08/23/2016   Procedure: LAPAROSCOPIC GASTRIC SLEEVE RESECTION WITH UPPER ENDO;  Surgeon: Gladis Cough, MD;  Location: [facility];  Service: General;  Laterality: N/A;   POSTERIOR LUMBAR FUSION  03/10/2009   L3-4 and L3-4 fusion, Dr. Catalina Stains   SHOULDER ARTHROSCOPY WITH ROTATOR CUFF REPAIR AND SUBACROMIAL  DECOMPRESSION Right 08/23/2013   Procedure: RIGHT SHOULDER ARTHROSCOPY WITH  SUBACROMIAL DECOMPRESSION DISTAL CLAVICLE RESECTION AND  ROTATOR CUFF REPAIR ;  Surgeon: Franky CHRISTELLA Pointer, MD;  Location: [facility];  Service: Orthopedics;  Laterality: Right;   TOTAL KNEE ARTHROPLASTY Right 08/29/2023   Procedure: ARTHROPLASTY, KNEE, TOTAL;  Surgeon: Jerri Kay CHRISTELLA, MD;  Location: [facility];  Service: Orthopedics;  Laterality: Right;   TUBAL LIGATION     Patient Active Problem List   Diagnosis Date Noted   S/P TKR (total knee replacement), right 08/30/2023   Status post total right knee replacement 08/29/2023   Primary osteoarthritis of right knee 05/03/2023   Primary osteoarthritis of left knee 05/03/2023   Memory change 12/25/2020   Chronic pain syndrome 05/04/2019   Raynaud's disease 04/13/2019   Lumbar radiculopathy 12/18/2018   Spinal stenosis of lumbar region with neurogenic claudication 12/18/2018   Post laminectomy syndrome 12/18/2018   Bilateral primary osteoarthritis of knee 12/27/2016   History of excision of intestinal structure 09/07/2016   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 08/23/2016   Prediabetes 12/03/2015   OSA on CPAP 02/19/2015   Lung field abnormal 11/11/2014   Morbid obesity (HCC) 10/09/2014   Constipation 06/14/2014   Peripheral venous insufficiency 05/25/2013  Pulmonary HTN (HCC) 05/12/2013   Bilateral lower extremity edema 05/12/2013   Morbid obesity with BMI of 50.0-59.9, adult (HCC)    Shoulder joint pain 03/28/2013   Abnormal glucose level 02/20/2013   Alopecia 02/20/2013   Degeneration of lumbar intervertebral disc 02/20/2013   Hyperglycemia due to type 2 diabetes mellitus (HCC) 02/20/2013   Type 2 diabetes mellitus with other specified complication (HCC) 02/20/2013   Back pain, hx of prior back surgery 12/09/2011   Pancreatitis, mild, CT negative 12/08/2011   Fatty liver 12/08/2011   Substernal chest pain, suspected secondary to acute pancreatitis  12/06/2011   Hypertension 12/06/2011   Hypokalemia 12/06/2011   Hypothyroidism 12/06/2011   Anxiety 12/06/2011   BV (bacterial vaginosis)    Hyperlipidemia     PCP: Larnell Hamilton, MD  REFERRING PROVIDER: Jerri Kay HERO, MD  REFERRING DIAG: [id number] (ICD-10-CM) - S/P total knee arthroplasty, right  THERAPY DIAG:  No diagnosis found.  Rationale for Evaluation and Treatment: Rehabilitation  ONSET DATE: 08/29/23  SUBJECTIVE:   SUBJECTIVE STATEMENT: *** Pt states things are going better. Pt states she has been walking without the cane for a few weeks now without any issues.    From eval: Pt reports it's been hurting really bad. Pt's daughter and granddaughter are currently at home helping her. Pt reports she got 1 week of HHPT. Has been doing the exercises provided to her. Had been using the cane prior to surgery but left it in [place] . Has been using RW in the house currently.   PERTINENT HISTORY: Arthritis, DDD, HTN, spinal cord stimulator. Eval and treat right knee. S/p right TKA 08/29/2023  PAIN:  Are you having pain?  Yes: NPRS scale: 2/10 currently, at worst 2/10 within the past week Pain location: Front of R knee Pain description: Aching and throbbing Aggravating factors: Standing/walking, sometimes sitting Relieving factors: Ice, pain medication  PRECAUTIONS: None  RED FLAGS: None   WEIGHT BEARING RESTRICTIONS: No  FALLS:  Has patient fallen in last 6 months? No  LIVING ENVIRONMENT: Lives with: granddaughter Lives in: House/apartment Stairs: No; 2 steps to porch Has following equipment at home: Vannie - 2 wheeled and bed side commode  OCCUPATION: Retired - likes to watch TV, go out to eat  PLOF: Independent  PATIENT GOALS: Wants to get off using any a/d  NEXT MD VISIT: 09/15/23  OBJECTIVE:  Note: Objective measures were completed at Evaluation unless otherwise noted.  DIAGNOSTIC FINDINGS: n/a  PATIENT SURVEYS:  Lower Extremity Functional Score: 16  / 80 = 20.0 % Lower Extremity Functional Score: 40 / 80 = 50.0 %  COGNITION: Overall cognitive status: Within functional limits for tasks assessed     SENSATION: WFL no overt N/T  EDEMA:  Unable to accurately measure due to prevena wound vac not properly sealing down  MUSCLE LENGTH: Hamstrings: see knee ROM Thomas test: did not assess  POSTURE: flexed trunk   PALPATION: TTP along R knee joint line  LOWER EXTREMITY ROM:  Active ROM Right eval Left eval Right 09/22/23 Right 10/06/23 Right 10/12/23 Right 10/18/23 Right 10/31/2023 Right 11/15/23  Hip flexion          Hip extension          Hip abduction          Hip adduction          Hip internal rotation          Hip external rotation          Knee flexion  73 sitting 100 sitting 87 sitting 92 sitting  82 sitting EOM with OP 88 AROM supine beginning  106 AAROM 105 AROM sitting  Knee extension -15 sitting -10 sitting -10 LAQ -8 quad set -5 quad set sitting   4 -5 quad set sitting 0 PROM  Ankle dorsiflexion          Ankle plantarflexion          Ankle inversion          Ankle eversion           (Blank rows = not tested)  LOWER EXTREMITY MMT:  MMT Right eval Left eval  Hip flexion 4 4  Hip extension    Hip abduction supine 3+ 4  Hip adduction    Hip internal rotation    Hip external rotation    Knee flexion 4 5  Knee extension 4 5  Ankle dorsiflexion    Ankle plantarflexion    Ankle inversion    Ankle eversion     (Blank rows = not tested)  LOWER EXTREMITY SPECIAL TESTS:  Did not assess  FUNCTIONAL TESTS:  Eval 5x STS: 20.74 sec with UE  TUG: 39.38 sec with RW  Re-cert 89/78/74  5x STS: 21.84 sec without UE, 18.42 sec with UE  TUG: 13.57 Sec without a/d  GAIT: Distance walked: Into clinic Assistive device utilized: Environmental Consultant - 2 wheeled Level of assistance: Modified independence Comments: Antalgic, trunk flexed, decreased R hip ext  TODAY'S TREATMENT:  [facility] Adult PT Treatment:                                                 DATE: 11/29/2023 Therapeutic Exercise: Recumbent bike L1 x 6 min *** Sitting LAQ green TB 10 x3 sec Sitting hamstring curl green TB 10 x3 sec Therapeutic Activity: Rechecked goals (see below) Sit<>stand 3x5 Staggered st<>stand x10   [facility] Adult PT Treatment:                                                DATE: 11/15/2023 Therapeutic Exercise: Recumbent bike L1 x 6 min Sitting LAQ green TB 10 x3 sec Sitting hamstring curl green TB 10 x3 sec Therapeutic Activity: Rechecked goals (see below) Sit<>stand 3x5 Staggered st<>stand x10   [facility] Adult PT Treatment:                                                DATE: 11/10/2023 Therapeutic Exercise: Hee slide on board, strap for overpressure 2x20, hold 3s Neuromuscular re-ed: Standing TKE against RTB 2x8, 8s hold Supine QS 1x30, 3s hold Therapeutic Activity: Rec Bike 8' Heel raise on slant board 2x15, Ue a. PRN   [facility] Adult PT Treatment:                                                DATE: 11/07/2023 Neuromuscular re-ed: NMES with quad set and SAQ Dual channel Russian, 32 intensity, 10/10 Quad set,  SAQ  Therapeutic Activity: Heel slide  2x20 on slide board  HEP modification, educaiton for resting knee extension        PATIENT EDUCATION:  Education details: HEP updates, reassessment findings/goal status Person educated: Patient Education method: Explanation, Demonstration, and Handouts Education comprehension: verbalized understanding, returned demonstration, and needs further education  HOME EXERCISE PROGRAM: Access Code: [email or web address] URL: [email or web address] Date: 11/15/2023 Prepared by: [name]  Exercises - Seated Knee Extension with Resistance  - 1 x daily - 7 x weekly - 2 sets - 10 reps - 3 sec hold - Seated Hamstring Curls with Resistance  - 1 x daily - 7 x weekly - 2 sets - 10 reps - 3 sec hold - Staggered Sit-to-Stand  - 1 x daily - 7 x weekly  - 2 sets - 10 reps - 3 sec hold - Seated Knee Extension Stretch With Overpressure  - 1 x daily - 7 x weekly - 1 sets - 10 reps - 5 sec hold - Seated Knee Flexion AAROM  - 1 x daily - 7 x weekly - 1 sets - 10 reps - 5 sec hold  ASSESSMENT:  CLINICAL IMPRESSION: *** Ms. [name] continues to progress well towards her goals -- she has met 4 out of the 6. The 2 goals she has not fully met are her ROM and strength based on her 5x STS. Although she has R = L knee ROM, she has potential to maximize motion for improved stair ascent/descent in the community. Additionally, although she has not fully met her 5x STS, it has slightly improved and she is now capable of performing them without UE assist. Her amb has otherwise greatly improved and she is able to amb community distances without a/d. Ms. [name] will benefit from a few more weeks of PT at a reduced frequency to fully meet the rest of her goals. Updated pt's HEP accordingly to work on these continued deficits.    From eval: Patient is a 63 y.o. F who was seen today for physical therapy evaluation and treatment s/p R TKA on 08/29/23. Pt has received 1 week of home health and states she has been compliant with her HEP. Assessment indicates limited R knee ROM, decreased strength (especially in quad), and decreased balance affecting pt's safe mobility for home and in the community. TUG and 5x STS scores both indicate a high risk of falls. Pt will benefit from PT to improve on these issues, decrease pain and maximize level of function.   OBJECTIVE IMPAIRMENTS: Abnormal gait, decreased activity tolerance, decreased balance, decreased endurance, decreased mobility, difficulty walking, decreased ROM, decreased strength, hypomobility, increased fascial restrictions, increased muscle spasms, impaired flexibility, improper body mechanics, postural dysfunction, and pain.   ACTIVITY LIMITATIONS: carrying, lifting, bending, sitting, standing, squatting, sleeping, stairs,  transfers, bed mobility, bathing, toileting, dressing, hygiene/grooming, and locomotion level  PARTICIPATION LIMITATIONS: meal prep, cleaning, laundry, driving, shopping, and community activity  PERSONAL FACTORS: Age, Fitness, Past/current experiences, and Time since onset of injury/illness/exacerbation are also affecting patient's functional outcome.   REHAB POTENTIAL: Good  CLINICAL DECISION MAKING: Evolving/moderate complexity  EVALUATION COMPLEXITY: Moderate   GOALS: Goals reviewed with patient? Yes  SHORT TERM GOALS: Target date: 10/12/2023  Pt will be ind with initial HEP Baseline: Goal status: INITIAL  2.  Pt will have improved R knee ROM to >/=5-90 deg Baseline:  Goal status: MET 10/06/23: 5-92  3.  Pt will have improved 5x STS to </=15 sec to demo increasing functional LE  strength Baseline: 20.74 sec 11/15/23: 18.42 sec Goal status: PARTIALLY MET    LONG TERM GOALS: Re-cert Target date: 12/13/2023   Pt will be ind with management and progression of HEP Baseline:  Goal status: MET  2.  Pt will have improved 5x STS to </=13 sec to be considered a low fall risk Baseline: 20.74 sec 11/15/23: 18.72 sec Goal status: ONGOING  3.  Pt will have improved TUG with LRAD to </=15 sec to demo decreased fall risk Baseline: 39.38 sec 11/15/23: 13.57 sec no a/d Goal status: MET  4.  Pt will be able to amb limited community distances (at least 500') independently Baseline:  11/15/23: Has been walking to church, grocery store and throughout community without a/d Goal status: MET  5.  Pt will have improved LEFS to >/=25/80 to demo MCID for TKA Baseline: 16 11/15/23: Lower Extremity Functional Score: 40 / 80 = 50.0 % Goal status: MET  6.  Pt will have improved knee ROM to >/=5-110 deg to safely negotiate stairs within the community Baseline: 15-73 deg 11/15/23: 5-105 deg Goal status: ONGOING   PLAN:  PT FREQUENCY: 1x/week  PT DURATION: 4 weeks  PLANNED  INTERVENTIONS: 97164- PT Re-evaluation, 97750- Physical Performance Testing, 97110-Therapeutic exercises, 97530- Therapeutic activity, 97112- Neuromuscular re-education, 97535- Self Care, [id number]- Manual therapy, [id number]- Gait training, [phone]- Aquatic Therapy, [phone]- Ultrasound, 79439 (1-2 muscles), [id number] (3+ muscles)- Dry Needling, Patient/Family education, Balance training, Stair training, Taping, Joint mobilization, Scar mobilization, Cryotherapy, and Moist heat   Healthy blue medicaid -- doesn't cover vaso, estim, ionto  PLAN FOR NEXT SESSION: R LE progressive strengthening and end range ROM. Looking to fully meet goals.   [name], PT, DPT 11/29/2023, 7:54 AM   For all possible CPT codes, reference the Planned Interventions line above.     Check all conditions that are expected to impact treatment: {Conditions expected to impact treatment:None of these apply   If treatment provided at initial evaluation, no treatment charged due to lack of authorization.

## 2023-11-29 NOTE — Progress Notes (Signed)
 Neuropsychological Consultation   Patient:   [name]   DOB:   Jul 31, 1960  MR Number:  [id number]  Location:  [facility] [address] [place], [address] [place] [address] Dept: [phone]           Date of Service:   07/13/2023  Location of Service and Individuals present: Today's visit was conducted in my outpatient clinic office with the patient myself present.  Start Time:   10 AM End Time:   12 PM  1 hour and 50 minutes was spent in face-to-face clinical interview and the other 45 minutes was spent in records review, report writing and setting up testing protocols.  Patient Consent and Confidentiality: Limits of confidentiality were reviewed including the referral for neuropsychological evaluation and what that would entail including documentations in the patient's electronic medical records.  The patient consents to proceed.  Consent for Evaluation and Treatment:  Signed:  Yes Explanation of Privacy Policies:  Signed:  Yes Discussion of Confidentiality Limits:  Yes  Provider/Observer:  Norleen Asa, Psy.D.       Clinical Neuropsychologist       Billing Code/Service: 96116/96121  Chief Complaint:     Chief Complaint  Patient presents with   Memory Loss   Sleeping Problem   Anxiety    Reason for Service:    [name] is a 63 year old female referred for neuropsychological evaluation by her neurologist, True Mar, MD, due to increasing difficulties with memory. Past medical history includes hypertension, hyperlipidemia, hypothyroidism, allergies, pancreatitis, peripheral edema, anemia, arthritis, degenerative lumbar disc disease, chronic low back pain, status post spinal cord stimulator, diabetes, reflux disease, recurrent headaches, obstructive sleep apnea on CPAP, venous insufficiency, severe obesity (BMI > 40), and a history of multiple surgeries including  hysterectomy, back surgery, spinal cord stimulator placement, cataract extractions, orthopedic knee surgery, rotator cuff repair, and gastric sleeve. The patient was initially referred to neurology by her primary care provider in 2024 for a several-month history of forgetfulness. A MoCA score at that time was 15. She reports stable long-term memory but notes short-term memory issues began approximately two years ago, concurrent with significant pain and use of multiple pain medications. Functionally, she denies trouble driving. She reports mild difficulty with problem-solving on a computer. She has had a small amount of geographic disorientation, becoming turned around on one or two occasions over the past two years, but this is not a common occurrence. She denies changes in expressive language or word-finding. She notes some visual disturbances, describing seeing black spots after turning off lights and observing dark spots moving while watching TV; she has a history of cataracts. She denies tremors. There is a family history of a progressive degenerative process; her mother was diagnosed with Alzheimer's disease and passed away at 11. Other family members developed cognitive issues in their 63s. Her mother's cognitive symptoms reportedly began in her 64s and worsened in her 90s. Her obstructive sleep apnea is treated with CPAP, and records indicate use on 27 of 30 days with over four hours of use 73% of the time, utilizing a new nasal mask. Despite compliance, she reports poor sleep quality, sleeping intermittently. The importance of addressing sleep apnea was reiterated. She was started on low-dose donepezil  (Aricept ) but discontinued it after a few days due to blurry vision.  Onset and Duration of Symptoms:  Short-term memory issues were first noticed approximately two years ago. This occurred  during a period of significant pain and use of multiple pain medications. The initial referral to neurology for  forgetfulness was in 2024.  Progression of Symptoms:  The patient reports that her memory difficulties have remained stable over the past year and a half.  Additional Tests and Measures from other records:  Neuroimaging Results:  An MRI in 2024 showed no evidence of acute intracranial abnormality and no age-advanced or lobar predominant atrophy. It did note a few small, non-specific, chronic T2 flare hyperintensities scattered within the cerebral white matter, considered most often secondary to chronic small vessel ischemia.   Laboratory Tests:  Extensive blood work in July 2024 was largely benign. This included vitamin D , A1C, ANA, RPR, B12, folate, TSH, ESR, vitamin B6, vitamin B1, ATN profile, and ApoE Alzheimer's risk profile. The ATN profile favored the possibility of underlying Alzheimer's dementia. The APOE genotype was E3/E4, which correlates with an increased lifetime risk for late-onset Alzheimer's disease.  Patient's ATN profile included a low beta amyloid 42/40 ratio and an elevated P tau 181 finding with results consistent with findings found to correlate with the presence of Alzheimer's type pathology.  Sleep:  While compliant with CPAP, the patient reports not sleeping well overall, with an intermittent sleep pattern.  Medical History:   Past Medical History:  Diagnosis Date   Anemia when she was young   Arthritis    Congestion of nasal sinus    Constipation    takes Colace every other day   DDD (degenerative disc disease), lumbar    Dependent edema    Dry eyes    uses Systane Eye drops daily as needed   GERD (gastroesophageal reflux disease) 12/06/2011   takes Nexium  daily   Headache(784.0)    occasionally-d/t congestion   History of bronchitis 6-7 yrs ago   History of kidney stones    Hyperlipidemia    Hypertension    takes Metoprolol  and Diovan  daily   Hypothyroidism 12/06/2011   takes Synthroid  daily   Joint pain    Multiple allergies    takes Zyrtec daily;uses  Nasonex daily as needed   Nocturia    Numbness    weakness-right hand   Onychomycosis    OSA on CPAP    Wears CPAP Nightly   Pancreatitis    Peripheral edema    takes Furosemide  daily   Pre-diabetes    S/P insertion of spinal cord stimulator    Shingles    Urinary frequency    Venous insufficiency of leg          Patient Active Problem List   Diagnosis Date Noted   S/P TKR (total knee replacement), right 08/30/2023   Status post total right knee replacement 08/29/2023   Primary osteoarthritis of right knee 05/03/2023   Primary osteoarthritis of left knee 05/03/2023   Memory change 12/25/2020   Chronic pain syndrome 05/04/2019   Raynaud's disease 04/13/2019   Lumbar radiculopathy 12/18/2018   Spinal stenosis of lumbar region with neurogenic claudication 12/18/2018   Post laminectomy syndrome 12/18/2018   Bilateral primary osteoarthritis of knee 12/27/2016   History of excision of intestinal structure 09/07/2016   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 08/23/2016   Prediabetes 12/03/2015   OSA on CPAP 02/19/2015   Lung field abnormal 11/11/2014   Morbid obesity (HCC) 10/09/2014   Constipation 06/14/2014   Peripheral venous insufficiency 05/25/2013   Pulmonary HTN (HCC) 05/12/2013   Bilateral lower extremity edema 05/12/2013   Morbid obesity with BMI of 50.0-59.9, adult (  HCC)    Shoulder joint pain 03/28/2013   Abnormal glucose level 02/20/2013   Alopecia 02/20/2013   Degeneration of lumbar intervertebral disc 02/20/2013   Hyperglycemia due to type 2 diabetes mellitus (HCC) 02/20/2013   Type 2 diabetes mellitus with other specified complication (HCC) 02/20/2013   Back pain, hx of prior back surgery 12/09/2011   Pancreatitis, mild, CT negative 12/08/2011   Fatty liver 12/08/2011   Substernal chest pain, suspected secondary to acute pancreatitis 12/06/2011   Hypertension 12/06/2011   Hypokalemia 12/06/2011   Hypothyroidism 12/06/2011   Anxiety 12/06/2011   BV  (bacterial vaginosis)    Hyperlipidemia      Behavioral Observation/Mental Status:   [name]  presents as a 63 y.o.-year-old Right handed African American Female who appeared her stated age. her dress was Appropriate and she was Well Groomed and her manners were Appropriate to the situation.  her participation was indicative of Appropriate behaviors.  There were not physical disabilities noted.  she displayed an appropriate level of cooperation and motivation.    Interactions:    Active Appropriate  Attention:   abnormal and attention span appeared shorter than expected for age  Memory:   abnormal; remote memory intact, recent memory impaired  Visuo-spatial:   not examined  Speech (Volume):  normal  Speech:   normal; some apparent word finding issues were noted during clinical interview  Thought Process:  Coherent and Relevant  Coherent and Linear  Though Content:  WNL; not suicidal and not homicidal  Orientation:   person, place, time/date, and situation  Judgment:   Fair  Planning:   Fair  Affect:    Appropriate  Mood:    Dysphoric  Insight:   Good  Intelligence:   normal  Marital Status/Living:  The patient was born and raised in [place], [place] . She grew up with six siblings, three of whom died during childhood. She reports a normal pregnancy, delivery, and achievement of developmental milestones at appropriate times. She is right-hand dominant. She continues to live alone in her own house, where she has resided for 21 years.  Educational and Occupational History:     Highest Level of Education:  Graduated from [employer] and completed some college courses without obtaining a formal degree.  Current Occupation:    Retired  Work History:    The patient is retired. 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  Hobbies and Interests: Hobbies include attending church, spending time with family and  friends, and going to movies.    Psychiatric History:   The patient reports a recurrent intrusive thought related to people hacking into her phone.  Patient has no prior psychiatric history  History of Substance Use or Abuse:  No concerns of substance abuse are reported.  Family Med/Psych History:  Family History  Problem Relation Age of Onset   Hypertension Mother    Dementia Mother    Alzheimer's disease Mother    Hypertension Father    Hypertension Sister    Kidney disease Sister        KIDNEL TRANSPLANT   Thyroid  disease Sister    Thyroid  disease Sister    Hypertension Sister    Breast cancer Maternal Aunt        >50; passed away from it   Breast cancer Maternal Aunt        >50    Impression/DX:   [name] is a 63 year old female referred  by her neurologist, Dr. Arva Mar, for neuropsychological evaluation due to memory difficulties. Primary complaints are of short-term memory deficits first noted two years ago, which have remained stable for the past 1.5 years. She reports stable long-term memory. She has a significant past medical history. Functionally, she reports rare geographic disorientation and mild difficulty with problem-solving on a computer. She denies trouble driving. She reports some visual disturbances. There is a family history of late-onset Alzheimer's disease.  Disposition/Plan:  We have set the patient for formal neuropsychological evaluation including the Wechsler Adult Intelligence Scale, Wechsler Memory Scale's and other appropriate neuropsychological test measures to assist in diagnostic considerations and treatment planning.  Diagnosis:    Memory loss  OSA on CPAP  Chronic pain syndrome        Note: This document was prepared using Dragon voice recognition software and may include unintentional dictation errors.   Electronically Signed   _______________________ Norleen Asa, Psy.D. Clinical Neuropsychologist

## 2023-12-06 ENCOUNTER — Encounter: Payer: Self-pay | Admitting: Physical Therapy

## 2023-12-06 ENCOUNTER — Ambulatory Visit: Admitting: Physical Therapy

## 2023-12-06 DIAGNOSIS — M6281 Muscle weakness (generalized): Secondary | ICD-10-CM

## 2023-12-06 DIAGNOSIS — M25561 Pain in right knee: Secondary | ICD-10-CM | POA: Diagnosis present

## 2023-12-06 DIAGNOSIS — R293 Abnormal posture: Secondary | ICD-10-CM | POA: Diagnosis present

## 2023-12-06 DIAGNOSIS — R262 Difficulty in walking, not elsewhere classified: Secondary | ICD-10-CM | POA: Diagnosis present

## 2023-12-06 DIAGNOSIS — M25661 Stiffness of right knee, not elsewhere classified: Secondary | ICD-10-CM | POA: Diagnosis present

## 2023-12-06 NOTE — Therapy (Signed)
 OUTPATIENT PHYSICAL THERAPY TREATMENT/RE-CERT NOTE   Patient Name: [name] MRN: [id number] DOB:06/28/1960, 63 y.o., female Today's Date: 12/06/2023  END OF SESSION:  PT End of Session - 12/06/23 0954     Visit Number 15    Number of Visits 16    Date for Recertification  12/13/23    Authorization Type [employer]    Authorization Time Period 3 PT visits from 11/22/23-12/21/23    Authorization - Visit Number 1    Authorization - Number of Visits 3    PT Start Time 0955    PT Stop Time 1035    PT Time Calculation (min) 40 min    Activity Tolerance Patient tolerated treatment well    Behavior During Therapy WFL for tasks assessed/performed                Past Medical History:  Diagnosis Date   Anemia when she was young   Arthritis    Congestion of nasal sinus    Constipation    takes Colace every other day   DDD (degenerative disc disease), lumbar    Dependent edema    Dry eyes    uses Systane Eye drops daily as needed   GERD (gastroesophageal reflux disease) 12/06/2011   takes Nexium  daily   Headache(784.0)    occasionally-d/t congestion   History of bronchitis 6-7 yrs ago   History of kidney stones    Hyperlipidemia    Hypertension    takes Metoprolol  and Diovan  daily   Hypothyroidism 12/06/2011   takes Synthroid  daily   Joint pain    Multiple allergies    takes Zyrtec daily;uses Nasonex daily as needed   Nocturia    Numbness    weakness-right hand   Onychomycosis    OSA on CPAP    Wears CPAP Nightly   Pancreatitis    Peripheral edema    takes Furosemide  daily   Pre-diabetes    S/P insertion of spinal cord stimulator    Shingles    Urinary frequency    Venous insufficiency of leg    Past Surgical History:  Procedure Laterality Date   ABDOMINAL HYSTERECTOMY     uterine prolapse   BACK SURGERY     L4-5   CARDIAC CATHETERIZATION  2007   CATARACT EXTRACTION, BILATERAL  2023   CHOLECYSTECTOMY     COLONOSCOPY      ESOPHAGOGASTRODUODENOSCOPY  5-51yrs ago   FOOT SURGERY Bilateral    KNEE ARTHROSCOPY Right    LAMINECTOMY THORACIC SPINE W/ PLACEMENT SPINAL CORD STIMULATOR     LAPAROSCOPIC GASTRIC SLEEVE RESECTION N/A 08/23/2016   Procedure: LAPAROSCOPIC GASTRIC SLEEVE RESECTION WITH UPPER ENDO;  Surgeon: Gladis Cough, MD;  Location: [facility];  Service: General;  Laterality: N/A;   POSTERIOR LUMBAR FUSION  03/10/2009   L3-4 and L3-4 fusion, Dr. Catalina Stains   SHOULDER ARTHROSCOPY WITH ROTATOR CUFF REPAIR AND SUBACROMIAL DECOMPRESSION Right 08/23/2013   Procedure: RIGHT SHOULDER ARTHROSCOPY WITH  SUBACROMIAL DECOMPRESSION DISTAL CLAVICLE RESECTION AND  ROTATOR CUFF REPAIR ;  Surgeon: Franky CHRISTELLA Pointer, MD;  Location: [facility];  Service: Orthopedics;  Laterality: Right;   TOTAL KNEE ARTHROPLASTY Right 08/29/2023   Procedure: ARTHROPLASTY, KNEE, TOTAL;  Surgeon: Jerri Kay CHRISTELLA, MD;  Location: [facility];  Service: Orthopedics;  Laterality: Right;   TUBAL LIGATION     Patient Active Problem List   Diagnosis Date Noted   S/P TKR (total knee replacement), right 08/30/2023   Status post total right knee replacement  08/29/2023   Primary osteoarthritis of right knee 05/03/2023   Primary osteoarthritis of left knee 05/03/2023   Memory change 12/25/2020   Chronic pain syndrome 05/04/2019   Raynaud's disease 04/13/2019   Lumbar radiculopathy 12/18/2018   Spinal stenosis of lumbar region with neurogenic claudication 12/18/2018   Post laminectomy syndrome 12/18/2018   Bilateral primary osteoarthritis of knee 12/27/2016   History of excision of intestinal structure 09/07/2016   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 08/23/2016   Prediabetes 12/03/2015   OSA on CPAP 02/19/2015   Lung field abnormal 11/11/2014   Morbid obesity (HCC) 10/09/2014   Constipation 06/14/2014   Peripheral venous insufficiency 05/25/2013   Pulmonary HTN (HCC) 05/12/2013   Bilateral lower extremity edema 05/12/2013   Morbid obesity with BMI  of 50.0-59.9, adult (HCC)    Shoulder joint pain 03/28/2013   Abnormal glucose level 02/20/2013   Alopecia 02/20/2013   Degeneration of lumbar intervertebral disc 02/20/2013   Hyperglycemia due to type 2 diabetes mellitus (HCC) 02/20/2013   Type 2 diabetes mellitus with other specified complication (HCC) 02/20/2013   Back pain, hx of prior back surgery 12/09/2011   Pancreatitis, mild, CT negative 12/08/2011   Fatty liver 12/08/2011   Substernal chest pain, suspected secondary to acute pancreatitis 12/06/2011   Hypertension 12/06/2011   Hypokalemia 12/06/2011   Hypothyroidism 12/06/2011   Anxiety 12/06/2011   BV (bacterial vaginosis)    Hyperlipidemia     PCP: Larnell Hamilton, MD  REFERRING PROVIDER: Jerri Kay HERO, MD  REFERRING DIAG: [phone] (ICD-10-CM) - S/P total knee arthroplasty, right  THERAPY DIAG:  Stiffness of right knee, not elsewhere classified  Acute pain of right knee  Difficulty in walking, not elsewhere classified  Muscle weakness (generalized)  Abnormal posture  Rationale for Evaluation and Treatment: Rehabilitation  ONSET DATE: 08/29/23  SUBJECTIVE:   SUBJECTIVE STATEMENT: Pt states the knee doesn't even hurt. Not too stiff. States she wasn't printed out a paper for the last 2 appointments. Has been doing her HEP.   From eval: Pt reports it's been hurting really bad. Pt's daughter and granddaughter are currently at home helping her. Pt reports she got 1 week of HHPT. Has been doing the exercises provided to her. Had been using the cane prior to surgery but left it in [place] . Has been using RW in the house currently.   PERTINENT HISTORY: Arthritis, DDD, HTN, spinal cord stimulator. Eval and treat right knee. S/p right TKA 08/29/2023  PAIN:  Are you having pain?  Yes: NPRS scale: 0/10 currently, at worst 2/10 within the past week Pain location: Front of R knee Pain description: Aching and throbbing Aggravating factors: Standing/walking, sometimes  sitting Relieving factors: Ice, pain medication  PRECAUTIONS: None  RED FLAGS: None   WEIGHT BEARING RESTRICTIONS: No  FALLS:  Has patient fallen in last 6 months? No  LIVING ENVIRONMENT: Lives with: granddaughter Lives in: House/apartment Stairs: No; 2 steps to porch Has following equipment at home: Vannie - 2 wheeled and bed side commode  OCCUPATION: Retired - likes to watch TV, go out to eat  PLOF: Independent  PATIENT GOALS: Wants to get off using any a/d  NEXT MD VISIT: 09/15/23  OBJECTIVE:  Note: Objective measures were completed at Evaluation unless otherwise noted.  DIAGNOSTIC FINDINGS: n/a  PATIENT SURVEYS:  Lower Extremity Functional Score: 16 / 80 = 20.0 % Lower Extremity Functional Score: 40 / 80 = 50.0 %  COGNITION: Overall cognitive status: Within functional limits for tasks assessed  SENSATION: WFL no overt N/T  EDEMA:  Unable to accurately measure due to prevena wound vac not properly sealing down  MUSCLE LENGTH: Hamstrings: see knee ROM Thomas test: did not assess  POSTURE: flexed trunk   PALPATION: TTP along R knee joint line  LOWER EXTREMITY ROM:  Active ROM Right eval Left eval Right 09/22/23 Right 10/06/23 Right 10/12/23 Right 10/18/23 Right 10/31/2023 Right 11/15/23  Hip flexion          Hip extension          Hip abduction          Hip adduction          Hip internal rotation          Hip external rotation          Knee flexion 73 sitting 100 sitting 87 sitting 92 sitting  82 sitting EOM with OP 88 AROM supine beginning  106 AAROM 105 AROM sitting  Knee extension -15 sitting -10 sitting -10 LAQ -8 quad set -5 quad set sitting   4 -5 quad set sitting 0 PROM  Ankle dorsiflexion          Ankle plantarflexion          Ankle inversion          Ankle eversion           (Blank rows = not tested)  LOWER EXTREMITY MMT:  MMT Right eval Left eval  Hip flexion 4 4  Hip extension    Hip abduction supine 3+ 4  Hip  adduction    Hip internal rotation    Hip external rotation    Knee flexion 4 5  Knee extension 4 5  Ankle dorsiflexion    Ankle plantarflexion    Ankle inversion    Ankle eversion     (Blank rows = not tested)  LOWER EXTREMITY SPECIAL TESTS:  Did not assess  FUNCTIONAL TESTS:  Eval 5x STS: 20.74 sec with UE  TUG: 39.38 sec with RW  Re-cert 89/78/74  5x STS: 21.84 sec without UE, 18.42 sec with UE  TUG: 13.57 Sec without a/d  GAIT: Distance walked: Into clinic Assistive device utilized: Environmental Consultant - 2 wheeled Level of assistance: Modified independence Comments: Antalgic, trunk flexed, decreased R hip ext  TODAY'S TREATMENT:  [facility] Adult PT Treatment:                                                DATE: 12/06/2023 Therapeutic Exercise: Recumbent bike L1 x 8 min  HEP updates Therapeutic Activity: Hamstring curl machine 25# 2x10 Leg ext machine 25# 3x8 Leg press 45# double leg 2x10 Leg press 45# single leg 2x10 Shoulder ext + marching red TB 10x3 Palloff press tandem stance red TB 2x10x3  Single limb stance on R, L LE fwd, side and back with wash rag and then with tapping on yoga blocks x10   [facility] Adult PT Treatment:                                                DATE: 11/15/2023 Therapeutic Exercise: Recumbent bike L1 x 6 min Sitting LAQ green TB 10 x3 sec Sitting hamstring curl green TB 10 x3 sec  Therapeutic Activity: Rechecked goals (see below) Sit<>stand 3x5 Staggered st<>stand x10   [facility] Adult PT Treatment:                                                DATE: 11/10/2023 Therapeutic Exercise: Hee slide on board, strap for overpressure 2x20, hold 3s Neuromuscular re-ed: Standing TKE against RTB 2x8, 8s hold Supine QS 1x30, 3s hold Therapeutic Activity: Rec Bike 8' Heel raise on slant board 2x15, Ue a. PRN   [facility] Adult PT Treatment:                                                DATE: 11/07/2023 Neuromuscular re-ed: NMES with quad set and SAQ Dual  channel Russian, 32 intensity, 10/10 Quad set, SAQ  Therapeutic Activity: Heel slide  2x20 on slide board  HEP modification, educaiton for resting knee extension        PATIENT EDUCATION:  Education details: HEP updates, reassessment findings/goal status Person educated: Patient Education method: Explanation, Demonstration, and Handouts Education comprehension: verbalized understanding, returned demonstration, and needs further education  HOME EXERCISE PROGRAM: Access Code: [email or web address] URL: [email or web address] Date: 11/15/2023 Prepared by: Kamaljit Hizer April Earnie Starring  Exercises - Seated Knee Extension with Resistance  - 1 x daily - 7 x weekly - 2 sets - 10 reps - 3 sec hold - Seated Hamstring Curls with Resistance  - 1 x daily - 7 x weekly - 2 sets - 10 reps - 3 sec hold - Staggered Sit-to-Stand  - 1 x daily - 7 x weekly - 2 sets - 10 reps - 3 sec hold - Seated Knee Extension Stretch With Overpressure  - 1 x daily - 7 x weekly - 1 sets - 10 reps - 5 sec hold - Seated Knee Flexion AAROM  - 1 x daily - 7 x weekly - 1 sets - 10 reps - 5 sec hold  ASSESSMENT:  CLINICAL IMPRESSION: Treatment focused on gross strengthening and transitioning to community wellness. Concentrated on R single limb stability and balance. Making good strength progress to meet her goals.    From eval: Patient is a 63 y.o. F who was seen today for physical therapy evaluation and treatment s/p R TKA on 08/29/23. Pt has received 1 week of home health and states she has been compliant with her HEP. Assessment indicates limited R knee ROM, decreased strength (especially in quad), and decreased balance affecting pt's safe mobility for home and in the community. TUG and 5x STS scores both indicate a high risk of falls. Pt will benefit from PT to improve on these issues, decrease pain and maximize level of function.   OBJECTIVE IMPAIRMENTS: Abnormal gait, decreased activity tolerance, decreased balance,  decreased endurance, decreased mobility, difficulty walking, decreased ROM, decreased strength, hypomobility, increased fascial restrictions, increased muscle spasms, impaired flexibility, improper body mechanics, postural dysfunction, and pain.   ACTIVITY LIMITATIONS: carrying, lifting, bending, sitting, standing, squatting, sleeping, stairs, transfers, bed mobility, bathing, toileting, dressing, hygiene/grooming, and locomotion level  PARTICIPATION LIMITATIONS: meal prep, cleaning, laundry, driving, shopping, and community activity  PERSONAL FACTORS: Age, Fitness, Past/current experiences, and Time since onset of injury/illness/exacerbation are also affecting patient's functional outcome.   REHAB POTENTIAL: Good  CLINICAL DECISION  MAKING: Evolving/moderate complexity  EVALUATION COMPLEXITY: Moderate   GOALS: Goals reviewed with patient? Yes  SHORT TERM GOALS: Target date: 10/12/2023  Pt will be ind with initial HEP Baseline: Goal status: INITIAL  2.  Pt will have improved R knee ROM to >/=5-90 deg Baseline:  Goal status: MET 10/06/23: 5-92  3.  Pt will have improved 5x STS to </=15 sec to demo increasing functional LE strength Baseline: 20.74 sec 11/15/23: 18.42 sec Goal status: PARTIALLY MET    LONG TERM GOALS: Re-cert Target date: 12/13/2023   Pt will be ind with management and progression of HEP Baseline:  Goal status: MET  2.  Pt will have improved 5x STS to </=13 sec to be considered a low fall risk Baseline: 20.74 sec 11/15/23: 18.72 sec Goal status: ONGOING  3.  Pt will have improved TUG with LRAD to </=15 sec to demo decreased fall risk Baseline: 39.38 sec 11/15/23: 13.57 sec no a/d Goal status: MET  4.  Pt will be able to amb limited community distances (at least 500') independently Baseline:  11/15/23: Has been walking to church, grocery store and throughout community without a/d Goal status: MET  5.  Pt will have improved LEFS to >/=25/80 to demo  MC[facility] for TKA Baseline: 16 11/15/23: Lower Extremity Functional Score: 40 / 80 = 50.0 % Goal status: MET  6.  Pt will have improved knee ROM to >/=5-110 deg to safely negotiate stairs within the community Baseline: 15-73 deg 11/15/23: 5-105 deg Goal status: ONGOING   PLAN:  PT FREQUENCY: 1x/week  PT DURATION: 4 weeks  PLANNED INTERVENTIONS: 97164- PT Re-evaluation, 97750- Physical Performance Testing, 97110-Therapeutic exercises, 97530- Therapeutic activity, 97112- Neuromuscular re-education, 97535- Self Care, [id number]- Manual therapy, [id number]- Gait training, [phone]- Aquatic Therapy, [phone]- Ultrasound, 79439 (1-2 muscles), 20561 (3+ muscles)- Dry Needling, Patient/Family education, Balance training, Stair training, Taping, Joint mobilization, Scar mobilization, Cryotherapy, and Moist heat   [employer] med[employer] -- doesn't cover vaso, estim, ionto  PLAN FOR NEXT SESSION: R LE progressive strengthening and end range ROM. Looking to fully meet goals.   Cheyrl Buley April Ma L Avin Gibbons, PT, DPT 12/06/2023, 9:55 AM   For all possible CPT codes, reference the Planned Interventions line above.     Check all conditions that are expected to impact treatment: {Conditions expected to impact treatment:None of these apply   If treatment provided at initial evaluation, no treatment charged due to lack of authorization.

## 2023-12-13 ENCOUNTER — Ambulatory Visit: Admitting: Physical Therapy

## 2023-12-13 ENCOUNTER — Encounter: Payer: Self-pay | Admitting: Physical Therapy

## 2023-12-13 DIAGNOSIS — M25561 Pain in right knee: Secondary | ICD-10-CM

## 2023-12-13 DIAGNOSIS — R262 Difficulty in walking, not elsewhere classified: Secondary | ICD-10-CM

## 2023-12-13 DIAGNOSIS — M6281 Muscle weakness (generalized): Secondary | ICD-10-CM

## 2023-12-13 DIAGNOSIS — M25661 Stiffness of right knee, not elsewhere classified: Secondary | ICD-10-CM | POA: Diagnosis not present

## 2023-12-13 NOTE — Therapy (Signed)
 OUTPATIENT PHYSICAL THERAPY TREATMENT AND DISCHARGE  PHYSICAL THERAPY DISCHARGE SUMMARY  Visits from Start of Care: 16  Current functional level related to goals / functional outcomes: See below   Remaining deficits: See below   Education / Equipment: See below   Patient agrees to discharge. Patient goals were met. Patient is being discharged due to meeting the stated rehab goals.    Patient Name: [name] MRN: [id number] DOB:1960-01-27, 63 y.o., female Today's Date: 12/13/2023  END OF SESSION:  PT End of Session - 12/13/23 0802     Visit Number 16    Number of Visits 16    Date for Recertification  12/13/23    Authorization Type [employer]    Authorization Time Period 3 PT visits from 11/22/23-12/21/23    Authorization - Visit Number 2    Authorization - Number of Visits 3    PT Start Time 0803    PT Stop Time 0844    PT Time Calculation (min) 41 min    Activity Tolerance Patient tolerated treatment well    Behavior During Therapy [facility] for tasks assessed/performed                Past Medical History:  Diagnosis Date   Anemia when she was young   Arthritis    Congestion of nasal sinus    Constipation    takes Colace every other day   DDD (degenerative disc disease), lumbar    Dependent edema    Dry eyes    uses Systane Eye drops daily as needed   GERD (gastroesophageal reflux disease) 12/06/2011   takes Nexium  daily   Headache(784.0)    occasionally-d/t congestion   History of bronchitis 6-7 yrs ago   History of kidney stones    Hyperlipidemia    Hypertension    takes Metoprolol  and Diovan  daily   Hypothyroidism 12/06/2011   takes Synthroid  daily   Joint pain    Multiple allergies    takes Zyrtec daily;uses Nasonex daily as needed   Nocturia    Numbness    weakness-right hand   Onychomycosis    OSA on CPAP    Wears CPAP Nightly   Pancreatitis    Peripheral edema    takes Furosemide  daily   Pre-diabetes    S/P  insertion of spinal cord stimulator    Shingles    Urinary frequency    Venous insufficiency of leg    Past Surgical History:  Procedure Laterality Date   ABDOMINAL HYSTERECTOMY     uterine prolapse   BACK SURGERY     L4-5   CARDIAC CATHETERIZATION  2007   CATARACT EXTRACTION, BILATERAL  2023   CHOLECYSTECTOMY     COLONOSCOPY     ESOPHAGOGASTRODUODENOSCOPY  5-67yrs ago   FOOT SURGERY Bilateral    KNEE ARTHROSCOPY Right    LAMINECTOMY THORACIC SPINE W/ PLACEMENT SPINAL CORD STIMULATOR     LAPAROSCOPIC GASTRIC SLEEVE RESECTION N/A 08/23/2016   Procedure: LAPAROSCOPIC GASTRIC SLEEVE RESECTION WITH UPPER ENDO;  Surgeon: Gladis Cough, MD;  Location: [facility];  Service: General;  Laterality: N/A;   POSTERIOR LUMBAR FUSION  03/10/2009   L3-4 and L3-4 fusion, Dr. Catalina Stains   SHOULDER ARTHROSCOPY WITH ROTATOR CUFF REPAIR AND SUBACROMIAL DECOMPRESSION Right 08/23/2013   Procedure: RIGHT SHOULDER ARTHROSCOPY WITH  SUBACROMIAL DECOMPRESSION DISTAL CLAVICLE RESECTION AND  ROTATOR CUFF REPAIR ;  Surgeon: Franky CHRISTELLA Pointer, MD;  Location: [facility] OR;  Service: Orthopedics;  Laterality: Right;   TOTAL  KNEE ARTHROPLASTY Right 08/29/2023   Procedure: ARTHROPLASTY, KNEE, TOTAL;  Surgeon: Jerri Kay HERO, MD;  Location: [facility] OR;  Service: Orthopedics;  Laterality: Right;   TUBAL LIGATION     Patient Active Problem List   Diagnosis Date Noted   S/P TKR (total knee replacement), right 08/30/2023   Status post total right knee replacement 08/29/2023   Primary osteoarthritis of right knee 05/03/2023   Primary osteoarthritis of left knee 05/03/2023   Memory change 12/25/2020   Chronic pain syndrome 05/04/2019   Raynaud's disease 04/13/2019   Lumbar radiculopathy 12/18/2018   Spinal stenosis of lumbar region with neurogenic claudication 12/18/2018   Post laminectomy syndrome 12/18/2018   Bilateral primary osteoarthritis of knee 12/27/2016   History of excision of intestinal structure 09/07/2016   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 08/23/2016   Prediabetes 12/03/2015   OSA on CPAP 02/19/2015   Lung field abnormal 11/11/2014   Morbid obesity (HCC) 10/09/2014   Constipation 06/14/2014   Peripheral venous insufficiency 05/25/2013   Pulmonary HTN (HCC) 05/12/2013   Bilateral lower extremity edema 05/12/2013   Morbid obesity with BMI of 50.0-59.9, adult (HCC)    Shoulder joint pain 03/28/2013   Abnormal glucose level 02/20/2013   Alopecia 02/20/2013   Degeneration of lumbar intervertebral disc 02/20/2013   Hyperglycemia due to type 2 diabetes mellitus (HCC) 02/20/2013   Type 2 diabetes mellitus with other specified complication (HCC) 02/20/2013   Back pain, hx of prior back surgery 12/09/2011   Pancreatitis, mild, CT negative 12/08/2011   Fatty liver 12/08/2011   Substernal chest pain, suspected secondary to acute pancreatitis 12/06/2011   Hypertension 12/06/2011   Hypokalemia 12/06/2011   Hypothyroidism 12/06/2011   Anxiety 12/06/2011   BV (bacterial vaginosis)    Hyperlipidemia     PCP: Larnell Hamilton, MD  REFERRING PROVIDER: Jerri Kay HERO, MD  REFERRING DIAG: [phone] (ICD-10-CM) - S/P total knee arthroplasty, right  THERAPY DIAG:  Stiffness of right knee, not elsewhere classified  Acute pain of right knee  Difficulty in walking, not elsewhere classified  Muscle weakness (generalized)  Rationale for Evaluation and Treatment: Rehabilitation  ONSET DATE: 08/29/23  SUBJECTIVE:   SUBJECTIVE STATEMENT: Pt reports nothing new or different. Knee is a little stiff this morning.   From eval: Pt reports it's been hurting really bad. Pt's daughter and granddaughter are currently at home helping her. Pt reports she got 1 week of HHPT. Has been doing the exercises provided to her. Had been using the cane prior to surgery but left it in [place] . Has been using RW in the house currently.   PERTINENT HISTORY: Arthritis, DDD, HTN, spinal cord stimulator. Eval and treat  right knee. S/p right TKA 08/29/2023  PAIN:  Are you having pain?  Yes: NPRS scale: 0/10 currently, at worst 2/10 within the past week Pain location: Front of R knee Pain description: Aching and throbbing Aggravating factors: Standing/walking, sometimes sitting Relieving factors: Ice, pain medication  PRECAUTIONS: None  RED FLAGS: None   WEIGHT BEARING RESTRICTIONS: No  FALLS:  Has patient fallen in last 6 months? No  LIVING ENVIRONMENT: Lives with: granddaughter Lives in: House/apartment Stairs: No; 2 steps to porch Has following equipment at home: Vannie - 2 wheeled and bed side commode  OCCUPATION: Retired - likes to watch TV, go out to eat  PLOF: Independent  PATIENT GOALS: Wants to get off using any a/d  NEXT MD VISIT: 09/15/23  OBJECTIVE:  Note: Objective measures were completed at Evaluation unless otherwise  noted.  DIAGNOSTIC FINDINGS: n/a  PATIENT SURVEYS:  Lower Extremity Functional Score: 16 / 80 = 20.0 % (eval) Lower Extremity Functional Score: 40 / 80 = 50.0 % (11/15/23) Lower Extremity Functional Score: 55 / 80 = 68.8 % (12/13/23)  COGNITION: Overall cognitive status: Within functional limits for tasks assessed     SENSATION: WFL no overt N/T  EDEMA:  Unable to accurately measure due to prevena wound vac not properly sealing down  MUSCLE LENGTH: Hamstrings: see knee ROM Thomas test: did not assess  POSTURE: flexed trunk   PALPATION: TTP along R knee joint line  LOWER EXTREMITY ROM:  Active ROM Right eval Left eval Right 09/22/23 Right 10/06/23 Right 10/12/23 Right 10/18/23 Right 10/31/2023 Right 11/15/23  Hip flexion          Hip extension          Hip abduction          Hip adduction          Hip internal rotation          Hip external rotation          Knee flexion 73 sitting 100 sitting 87 sitting 92 sitting  82 sitting EOM with OP 88 AROM supine beginning  106 AAROM 105 AROM sitting  Knee extension -15 sitting -10 sitting  -10 LAQ -8 quad set -5 quad set sitting   4 -5 quad set sitting 0 PROM  Ankle dorsiflexion          Ankle plantarflexion          Ankle inversion          Ankle eversion           (Blank rows = not tested)  LOWER EXTREMITY MMT:  MMT Right eval Left eval  Hip flexion 4 4  Hip extension    Hip abduction supine 3+ 4  Hip adduction    Hip internal rotation    Hip external rotation    Knee flexion 4 5  Knee extension 4 5  Ankle dorsiflexion    Ankle plantarflexion    Ankle inversion    Ankle eversion     (Blank rows = not tested)  LOWER EXTREMITY SPECIAL TESTS:  Did not assess  FUNCTIONAL TESTS:  Eval 5x STS: 20.74 sec with UE  TUG: 39.38 sec with RW  Re-cert 89/78/74  5x STS: 21.84 sec without UE, 18.42 sec with UE  TUG: 13.57 Sec without a/d  GAIT: Distance walked: Into clinic Assistive device utilized: Environmental Consultant - 2 wheeled Level of assistance: Modified independence Comments: Antalgic, trunk flexed, decreased R hip ext  TODAY'S TREATMENT:  [facility] Adult PT Treatment:                                                DATE: 12/13/2023 Therapeutic Exercise: Recumbent bike L2 x 8 min  Therapeutic Activity: Sit<>stand staggered no UE x10 Sit to stand chair touch 5# 2x10 Shoulder ext + marching blue TB 2x10x3 Palloff press tandem stance blue TB 2x10x3  Single leg clock reach fwd, side, bwd and bwd+across 2x10 [place] 3x10' Bwd walking 3x10' Fwd stepping over 3x6 hurdles 2x10'  Side stepping over 3x6 hurdles 2x10'   [facility] Adult PT Treatment:  DATE: 12/06/2023 Therapeutic Exercise: Recumbent bike L1 x 8 min  HEP updates Therapeutic Activity: Hamstring curl machine 25# 2x10 Leg ext machine 25# 3x8 Leg press 45# double leg 2x10 Leg press 45# single leg 2x10 Shoulder ext + marching red TB 10x3 Palloff press tandem stance red TB 2x10x3  Single limb stance on R, L LE fwd, side and back with wash rag and then with  tapping on yoga blocks x10   [facility] Adult PT Treatment:                                                DATE: 11/15/2023 Therapeutic Exercise: Recumbent bike L1 x 6 min Sitting LAQ green TB 10 x3 sec Sitting hamstring curl green TB 10 x3 sec Therapeutic Activity: Rechecked goals (see below) Sit<>stand 3x5 Staggered st<>stand x10   [facility] Adult PT Treatment:                                                DATE: 11/10/2023 Therapeutic Exercise: Hee slide on board, strap for overpressure 2x20, hold 3s Neuromuscular re-ed: Standing TKE against RTB 2x8, 8s hold Supine QS 1x30, 3s hold Therapeutic Activity: Rec Bike 8' Heel raise on slant board 2x15, Ue a. PRN       PATIENT EDUCATION:  Education details: HEP updates, reassessment findings/goal status Person educated: Patient Education method: Explanation, Demonstration, and Handouts Education comprehension: verbalized understanding, returned demonstration, and needs further education  HOME EXERCISE PROGRAM: Access Code: [email or web address] URL: [email or web address] Date: 11/15/2023 Prepared by: Kadedra Vanaken April Earnie Starring  Exercises - Seated Knee Extension with Resistance  - 1 x daily - 7 x weekly - 2 sets - 10 reps - 3 sec hold - Seated Hamstring Curls with Resistance  - 1 x daily - 7 x weekly - 2 sets - 10 reps - 3 sec hold - Staggered Sit-to-Stand  - 1 x daily - 7 x weekly - 2 sets - 10 reps - 3 sec hold - Seated Knee Extension Stretch With Overpressure  - 1 x daily - 7 x weekly - 1 sets - 10 reps - 5 sec hold - Seated Knee Flexion AAROM  - 1 x daily - 7 x weekly - 1 sets - 10 reps - 5 sec hold  ASSESSMENT:  CLINICAL IMPRESSION: Pt has now fully met goals. Strength has increased, LEFS has continued to improve and single limb stability/balance has also progressed. Pt able to manage all obstacles and multidirectional stepping with good stability. Only mild LOBs noted with stepping across midline. Updated pt's HEP to  finalize her d/c.    From eval: Patient is a 63 y.o. F who was seen today for physical therapy evaluation and treatment s/p R TKA on 08/29/23. Pt has received 1 week of home health and states she has been compliant with her HEP. Assessment indicates limited R knee ROM, decreased strength (especially in quad), and decreased balance affecting pt's safe mobility for home and in the community. TUG and 5x STS scores both indicate a high risk of falls. Pt will benefit from PT to improve on these issues, decrease pain and maximize level of function.   OBJECTIVE IMPAIRMENTS: Abnormal gait, decreased activity tolerance, decreased balance, decreased endurance, decreased mobility,  difficulty walking, decreased ROM, decreased strength, hypomobility, increased fascial restrictions, increased muscle spasms, impaired flexibility, improper body mechanics, postural dysfunction, and pain.   ACTIVITY LIMITATIONS: carrying, lifting, bending, sitting, standing, squatting, sleeping, stairs, transfers, bed mobility, bathing, toileting, dressing, hygiene/grooming, and locomotion level  PARTICIPATION LIMITATIONS: meal prep, cleaning, laundry, driving, shopping, and community activity  PERSONAL FACTORS: Age, Fitness, Past/current experiences, and Time since onset of injury/illness/exacerbation are also affecting patient's functional outcome.   REHAB POTENTIAL: Good  CLINICAL DECISION MAKING: Evolving/moderate complexity  EVALUATION COMPLEXITY: Moderate   GOALS: Goals reviewed with patient? Yes  SHORT TERM GOALS: Target date: 10/12/2023  Pt will be ind with initial HEP Baseline: Goal status: MET  2.  Pt will have improved R knee ROM to >/=5-90 deg Baseline:  Goal status: MET 10/06/23: 5-92  3.  Pt will have improved 5x STS to </=15 sec to demo increasing functional LE strength Baseline: 20.74 sec 11/15/23: 18.42 sec 12/13/23: 9.77 sec Goal status: MET    LONG TERM GOALS: Re-cert Target date:  12/13/2023   Pt will be ind with management and progression of HEP Baseline:  Goal status: MET  2.  Pt will have improved 5x STS to </=13 sec to be considered a low fall risk Baseline: 20.74 sec 11/15/23: 18.72 sec 12/13/23: 9.77 sec Goal status: MET  3.  Pt will have improved TUG with LRAD to </=15 sec to demo decreased fall risk Baseline: 39.38 sec 11/15/23: 13.57 sec no a/d Goal status: MET  4.  Pt will be able to amb limited community distances (at least 500') independently Baseline:  11/15/23: Has been walking to church, grocery store and throughout community without a/d Goal status: MET  5.  Pt will have improved LEFS to >/=25/80 to demo MCID for TKA Baseline: 16 11/15/23: Lower Extremity Functional Score: 40 / 80 = 50.0 % 12/13/23: Lower Extremity Functional Score: 55 / 80 = 68.8 % Goal status: MET  6.  Pt will have improved knee ROM to >/=5-110 deg to safely negotiate stairs within the community Baseline: 15-73 deg 11/15/23: 5-105 deg 12/13/23: 5-110 deg Goal status: MET   PLAN:  PT FREQUENCY: 1x/week  PT DURATION: 4 weeks  PLANNED INTERVENTIONS: 97164- PT Re-evaluation, 97750- Physical Performance Testing, 97110-Therapeutic exercises, 97530- Therapeutic activity, 97112- Neuromuscular re-education, 97535- Self Care, [id number]- Manual therapy, [id number]- Gait training, [phone]- Aquatic Therapy, [phone]- Ultrasound, 79439 (1-2 muscles), 20561 (3+ muscles)- Dry Needling, Patient/Family education, Balance training, Stair training, Taping, Joint mobilization, Scar mobilization, Cryotherapy, and Moist heat   [employer] -- doesn't cover vaso, estim, ionto  PLAN FOR NEXT SESSION: R LE progressive strengthening and end range ROM. Looking to fully meet goals.   Philis Doke April Ma L Dempsey Ahonen, PT, DPT 12/13/2023, 8:03 AM   For all possible CPT codes, reference the Planned Interventions line above.     Check all conditions that are expected to impact treatment: {Conditions  expected to impact treatment:None of these apply   If treatment provided at initial evaluation, no treatment charged due to lack of authorization.

## 2023-12-20 ENCOUNTER — Ambulatory Visit: Payer: Self-pay | Admitting: Physical Therapy

## 2024-01-23 ENCOUNTER — Encounter: Payer: Self-pay | Admitting: Neurology

## 2024-01-23 ENCOUNTER — Ambulatory Visit: Admitting: Neurology

## 2024-01-23 ENCOUNTER — Other Ambulatory Visit (HOSPITAL_COMMUNITY): Payer: Self-pay

## 2024-01-23 ENCOUNTER — Other Ambulatory Visit: Payer: Self-pay

## 2024-01-23 VITALS — BP 121/80 | HR 51 | Ht 63.0 in | Wt 180.0 lb

## 2024-01-23 DIAGNOSIS — G300 Alzheimer's disease with early onset: Secondary | ICD-10-CM | POA: Diagnosis not present

## 2024-01-23 DIAGNOSIS — F0280 Dementia in other diseases classified elsewhere without behavioral disturbance: Secondary | ICD-10-CM

## 2024-01-23 DIAGNOSIS — F0150 Vascular dementia without behavioral disturbance: Secondary | ICD-10-CM

## 2024-01-23 DIAGNOSIS — G309 Alzheimer's disease, unspecified: Secondary | ICD-10-CM | POA: Diagnosis not present

## 2024-01-23 DIAGNOSIS — G4733 Obstructive sleep apnea (adult) (pediatric): Secondary | ICD-10-CM | POA: Diagnosis not present

## 2024-01-23 MED ORDER — MEMANTINE HCL 5 MG PO TABS
5.0000 mg | ORAL_TABLET | Freq: Two times a day (BID) | ORAL | 5 refills | Status: AC
  Filled 2024-01-23: qty 60, 30d supply, fill #0

## 2024-01-23 NOTE — Patient Instructions (Addendum)
 Please continue using your CPAP regularly. While your insurance requires that you use CPAP at least 4 hours each night on 70% of the nights, I recommend, that you not skip any nights and use it throughout the night if you can. Getting used to CPAP and staying with the treatment long term does take time and patience and discipline. Untreated obstructive sleep apnea when it is moderate to severe can have an adverse impact on cardiovascular health and raise her risk for heart disease, arrhythmias, hypertension, congestive heart failure, stroke and diabetes. Untreated obstructive sleep apnea causes sleep disruption, nonrestorative sleep, and sleep deprivation. This can have an impact on your day to day functioning and cause daytime sleepiness and impairment of cognitive function, memory loss, mood disturbance, and problems focussing. Using CPAP regularly can improve these symptoms.  We will start you for your memory on Namenda (generic name: Memantine), starting at 5 mg twice daily with gradual buildup over time to 10 mg twice daily. Please note that side effects may include, but are not limited to: nausea, confusion, hallucination, personality changes. If you are having mild side effects, try to stick with the treatment as these initial side effects may go away after the first 10-14 days.     Please let us  know, if you are still taking the donepezil  5 mg daily.  I am a little worried about your driving, please have your family monitor it and I would suggest at this point only local roads, familiar routes, no nighttime and no highway driving.

## 2024-01-23 NOTE — Progress Notes (Signed)
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 intact to light touch in the upper and lower extremities.  Gait, station and balance: She stands easily. No veering to one side is noted. No leaning to one side is noted. Posture is age-appropriate and stance is narrow based. Gait shows normal stride length and normal pace. No problems turning are noted.  She walks without a walking aid.   Assessment and Plan:    In summary, DARBI CHANDRAN is a 63 year old female with an underlying complex medical history of hypertension, hyperlipidemia, hypothyroidism, allergies, history of pancreatitis, peripheral edema, anemia, arthritis, degenerative lumbar disc disease, chronic LBP, s/p spinal cord stimulator, edema, diabetes, reflux disease, recurrent headaches, sleep apnea, on CPAP therapy, venous insufficiency, and severe obesity with a BMI of over 40, status post multiple surgeries including hysterectomy, back surgery, spinal cord stimulator placement, cataract extractions, arthroscopic knee surgery, rotator cuff repair, gastric sleeve, who presents for follow-up consultation of her memory loss and sleep apnea.  She had a MoCA score of 15/30 on 08/16/2022, but I believe her score was confounded by stress and certain potentially  sedating medications at the time.  Her neuropsychological evaluation in June 2025 does support early onset Alzheimer's dementia and laboratory testing was also supportive in that regard but her complex medical history probably favors a more mixed type of dementia pattern and risk.  She was on donepezil  but had stopped it back in 2024.  She may be back on it again as of this year but she is not 100% sure about it, she is agreeable to calling us  back with an update.  She is advised to start low-dose memantine at this time.  I would not favor increasing her donepezil  from 5 mg once daily to 10 mg once daily due to her bradycardia.  She also reported  side effects in the past with the donepezil  so I would like to leave it on low-dose.  We talked about expectations and possible common side effects with memantine today.  She is agreeable to starting it, we will start at 5 mg twice daily for now and increase it cautiously.  She is advised to get back on her CPAP consistently and pick up her machine from her home to take it to her daughter's house where she is staying currently.  She is advised to stay better hydrated with water .  We talked about the importance of maintaining a healthy lifestyle including good nutrition and good hydration with water  as well as regular physical activity.  She is cautioned about her driving.  I am not completely sure if she is completely safe to drive and she is advised to have her driving monitored by her family.  She is advised to follow-up routinely in this clinic to see one of our nurse practitioners in about 6 months or so, sooner if needed.  I answered all her questions today and she was in agreement.  Of note, her brain MRI with and without contrast on 08/21/2022 showed benign findings.  She had mild white matter changes and no age advanced or significant lobar predominant atrophy.  Blood work from July 2024 showed an abnormal ATN profile and her APOE Alzheimer's risk profile favored  the possibility of underlying Alzheimer's dementia.  She has a family history of dementia affecting her mother.   I spent 40 minutes in total face-to-face time and in reviewing records during pre-charting, more than 50% of which was spent in counseling and coordination of care, reviewing test results, reviewing medications and treatment regimen and/or in discussing or reviewing the diagnosis of dementia, sleep apnea, the prognosis and treatment options. Pertinent laboratory and imaging test results that were available during this visit with the patient were reviewed by me and considered in my medical decision making (see chart for details).      [1]  Allergies Allergen Reactions   Flagyl  [Metronidazole ] Other (See Comments)    Developed pancreatitis   Adhesive [Tape] Itching and Rash   Crestor [Rosuvastatin] Rash   Latex Itching and Rash   Penicillins Rash   Ultram  [Tramadol  Hcl] Swelling and Rash    eye edema   Vesicare [Solifenacin] Rash

## 2024-01-30 ENCOUNTER — Encounter: Payer: Self-pay | Admitting: Internal Medicine

## 2024-02-09 ENCOUNTER — Other Ambulatory Visit (HOSPITAL_COMMUNITY): Payer: Self-pay

## 2024-02-23 ENCOUNTER — Other Ambulatory Visit (HOSPITAL_COMMUNITY): Payer: Self-pay

## 2024-04-02 ENCOUNTER — Ambulatory Visit: Admitting: Family Medicine

## 2024-07-23 ENCOUNTER — Ambulatory Visit: Admitting: Neurology

## 2024-08-28 ENCOUNTER — Encounter: Admitting: Psychology
